# Patient Record
Sex: Female | Born: 1945
Health system: Southern US, Community
[De-identification: ages and names within clinical notes are randomized; demographics above are authoritative.]

## PROBLEM LIST (undated history)

## (undated) DIAGNOSIS — G4733 Obstructive sleep apnea (adult) (pediatric): Secondary | ICD-10-CM

## (undated) DIAGNOSIS — T7840XA Allergy, unspecified, initial encounter: Secondary | ICD-10-CM

## (undated) DIAGNOSIS — F410 Panic disorder [episodic paroxysmal anxiety] without agoraphobia: Secondary | ICD-10-CM

## (undated) DIAGNOSIS — E785 Hyperlipidemia, unspecified: Secondary | ICD-10-CM

## (undated) DIAGNOSIS — E213 Hyperparathyroidism, unspecified: Secondary | ICD-10-CM

## (undated) DIAGNOSIS — M25561 Pain in right knee: Secondary | ICD-10-CM

## (undated) DIAGNOSIS — I8390 Asymptomatic varicose veins of unspecified lower extremity: Secondary | ICD-10-CM

## (undated) DIAGNOSIS — M1990 Unspecified osteoarthritis, unspecified site: Secondary | ICD-10-CM

## (undated) DIAGNOSIS — K219 Gastro-esophageal reflux disease without esophagitis: Secondary | ICD-10-CM

## (undated) DIAGNOSIS — R112 Nausea with vomiting, unspecified: Secondary | ICD-10-CM

## (undated) DIAGNOSIS — M792 Neuralgia and neuritis, unspecified: Secondary | ICD-10-CM

## (undated) DIAGNOSIS — E119 Type 2 diabetes mellitus without complications: Secondary | ICD-10-CM

## (undated) DIAGNOSIS — R0600 Dyspnea, unspecified: Secondary | ICD-10-CM

## (undated) DIAGNOSIS — D649 Anemia, unspecified: Secondary | ICD-10-CM

## (undated) DIAGNOSIS — E669 Obesity, unspecified: Secondary | ICD-10-CM

## (undated) DIAGNOSIS — N393 Stress incontinence (female) (male): Secondary | ICD-10-CM

## (undated) DIAGNOSIS — M797 Fibromyalgia: Secondary | ICD-10-CM

## (undated) DIAGNOSIS — I10 Essential (primary) hypertension: Secondary | ICD-10-CM

## (undated) DIAGNOSIS — E559 Vitamin D deficiency, unspecified: Secondary | ICD-10-CM

## (undated) DIAGNOSIS — J45909 Unspecified asthma, uncomplicated: Secondary | ICD-10-CM

## (undated) DIAGNOSIS — R011 Cardiac murmur, unspecified: Secondary | ICD-10-CM

## (undated) DIAGNOSIS — R519 Headache, unspecified: Secondary | ICD-10-CM

## (undated) DIAGNOSIS — F418 Other specified anxiety disorders: Secondary | ICD-10-CM

## (undated) DIAGNOSIS — M6289 Other specified disorders of muscle: Secondary | ICD-10-CM

## (undated) DIAGNOSIS — C801 Malignant (primary) neoplasm, unspecified: Secondary | ICD-10-CM

## (undated) DIAGNOSIS — R55 Syncope and collapse: Secondary | ICD-10-CM

## (undated) DIAGNOSIS — R51 Headache: Secondary | ICD-10-CM

## (undated) DIAGNOSIS — M2550 Pain in unspecified joint: Secondary | ICD-10-CM

## (undated) DIAGNOSIS — M549 Dorsalgia, unspecified: Secondary | ICD-10-CM

## (undated) DIAGNOSIS — T753XXA Motion sickness, initial encounter: Secondary | ICD-10-CM

## (undated) DIAGNOSIS — M8580 Other specified disorders of bone density and structure, unspecified site: Secondary | ICD-10-CM

## (undated) DIAGNOSIS — K829 Disease of gallbladder, unspecified: Secondary | ICD-10-CM

## (undated) HISTORY — PX: LUMBAR DISC SURGERY: SHX700

## (undated) HISTORY — PX: BLEPHAROPLASTY: SUR158

## (undated) HISTORY — DX: Neuralgia and neuritis, unspecified: M79.2

## (undated) HISTORY — DX: Motion sickness, initial encounter: T75.3XXA

## (undated) HISTORY — PX: BARIATRIC SURGERY: SHX1103

## (undated) HISTORY — DX: Asymptomatic varicose veins of unspecified lower extremity: I83.90

## (undated) HISTORY — DX: Obstructive sleep apnea (adult) (pediatric): G47.33

## (undated) HISTORY — PX: EYE SURGERY: SHX253

## (undated) HISTORY — DX: Dorsalgia, unspecified: M54.9

## (undated) HISTORY — DX: Dyspnea, unspecified: R06.00

## (undated) HISTORY — DX: Hyperparathyroidism, unspecified: E21.3

## (undated) HISTORY — PX: OTHER SURGICAL HISTORY: SHX169

## (undated) HISTORY — DX: Disease of gallbladder, unspecified: K82.9

## (undated) HISTORY — DX: Hyperlipidemia, unspecified: E78.5

## (undated) HISTORY — PX: CERVICAL DISC SURGERY: SHX588

## (undated) HISTORY — PX: BLADDER SUSPENSION: SHX72

## (undated) HISTORY — DX: Pain in unspecified joint: M25.50

## (undated) HISTORY — DX: Pain in right knee: M25.561

## (undated) HISTORY — DX: Headache, unspecified: R51.9

## (undated) HISTORY — DX: Nausea with vomiting, unspecified: R11.2

## (undated) HISTORY — DX: Fibromyalgia: M79.7

## (undated) HISTORY — DX: Other specified disorders of muscle: M62.89

## (undated) HISTORY — DX: Other specified anxiety disorders: F41.8

## (undated) HISTORY — DX: Syncope and collapse: R55

## (undated) HISTORY — DX: Gastro-esophageal reflux disease without esophagitis: K21.9

## (undated) HISTORY — DX: Obesity, unspecified: E66.9

## (undated) HISTORY — DX: Unspecified osteoarthritis, unspecified site: M19.90

## (undated) HISTORY — DX: Other specified disorders of bone density and structure, unspecified site: M85.80

## (undated) HISTORY — DX: Stress incontinence (female) (male): N39.3

## (undated) HISTORY — DX: Allergy, unspecified, initial encounter: T78.40XA

## (undated) HISTORY — DX: Headache: R51

## (undated) HISTORY — DX: Unspecified asthma, uncomplicated: J45.909

## (undated) HISTORY — DX: Essential (primary) hypertension: I10

## (undated) HISTORY — PX: CATARACT EXTRACTION: SUR2

## (undated) HISTORY — DX: Vitamin D deficiency, unspecified: E55.9

## (undated) HISTORY — DX: Type 2 diabetes mellitus without complications: E11.9

---
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
## 1976-01-23 HISTORY — PX: GALLBLADDER SURGERY: SHX652

## 1998-03-04 ENCOUNTER — Other Ambulatory Visit: Admission: RE | Admit: 1998-03-04 | Discharge: 1998-03-04 | Payer: Self-pay | Admitting: *Deleted

## 1998-03-10 ENCOUNTER — Observation Stay (HOSPITAL_COMMUNITY): Admission: RE | Admit: 1998-03-10 | Discharge: 1998-03-11 | Payer: Self-pay | Admitting: Neurosurgery

## 1998-03-10 ENCOUNTER — Encounter: Payer: Self-pay | Admitting: Neurosurgery

## 1998-04-21 ENCOUNTER — Ambulatory Visit (HOSPITAL_COMMUNITY): Admission: RE | Admit: 1998-04-21 | Discharge: 1998-04-21 | Payer: Self-pay | Admitting: Neurosurgery

## 1998-04-21 ENCOUNTER — Encounter: Payer: Self-pay | Admitting: Neurosurgery

## 1999-03-26 ENCOUNTER — Ambulatory Visit: Admission: RE | Admit: 1999-03-26 | Discharge: 1999-03-26 | Payer: Self-pay | Admitting: Otolaryngology

## 1999-07-02 ENCOUNTER — Ambulatory Visit (HOSPITAL_BASED_OUTPATIENT_CLINIC_OR_DEPARTMENT_OTHER): Admission: RE | Admit: 1999-07-02 | Discharge: 1999-07-02 | Payer: Self-pay | Admitting: Otolaryngology

## 1999-09-15 ENCOUNTER — Encounter: Admission: RE | Admit: 1999-09-15 | Discharge: 1999-09-15 | Payer: Self-pay | Admitting: Gynecology

## 1999-09-15 ENCOUNTER — Encounter: Payer: Self-pay | Admitting: Gynecology

## 1999-10-02 ENCOUNTER — Other Ambulatory Visit: Admission: RE | Admit: 1999-10-02 | Discharge: 1999-10-02 | Payer: Self-pay | Admitting: Gynecology

## 2000-12-20 ENCOUNTER — Encounter: Payer: Self-pay | Admitting: Gynecology

## 2000-12-20 ENCOUNTER — Encounter: Admission: RE | Admit: 2000-12-20 | Discharge: 2000-12-20 | Payer: Self-pay | Admitting: Gynecology

## 2001-01-13 ENCOUNTER — Other Ambulatory Visit: Admission: RE | Admit: 2001-01-13 | Discharge: 2001-01-13 | Payer: Self-pay | Admitting: Gynecology

## 2001-01-22 HISTORY — PX: GASTRIC BYPASS: SHX52

## 2002-01-29 ENCOUNTER — Encounter: Admission: RE | Admit: 2002-01-29 | Discharge: 2002-01-29 | Payer: Self-pay | Admitting: Gynecology

## 2002-01-29 ENCOUNTER — Encounter: Payer: Self-pay | Admitting: Gynecology

## 2002-02-12 ENCOUNTER — Encounter: Admission: RE | Admit: 2002-02-12 | Discharge: 2002-02-12 | Payer: Self-pay | Admitting: Family Medicine

## 2002-02-12 ENCOUNTER — Encounter: Payer: Self-pay | Admitting: Family Medicine

## 2002-04-02 ENCOUNTER — Other Ambulatory Visit: Admission: RE | Admit: 2002-04-02 | Discharge: 2002-04-02 | Payer: Self-pay | Admitting: Gynecology

## 2002-12-07 ENCOUNTER — Encounter: Admission: RE | Admit: 2002-12-07 | Discharge: 2002-12-07 | Payer: Self-pay | Admitting: Family Medicine

## 2003-02-01 ENCOUNTER — Inpatient Hospital Stay (HOSPITAL_COMMUNITY): Admission: AD | Admit: 2003-02-01 | Discharge: 2003-02-03 | Payer: Self-pay | Admitting: Neurosurgery

## 2003-03-23 ENCOUNTER — Encounter: Admission: RE | Admit: 2003-03-23 | Discharge: 2003-03-23 | Payer: Self-pay | Admitting: Gynecology

## 2003-03-25 ENCOUNTER — Encounter: Admission: RE | Admit: 2003-03-25 | Discharge: 2003-03-25 | Payer: Self-pay | Admitting: Neurosurgery

## 2003-04-09 ENCOUNTER — Other Ambulatory Visit: Admission: RE | Admit: 2003-04-09 | Discharge: 2003-04-09 | Payer: Self-pay | Admitting: Gynecology

## 2003-04-21 ENCOUNTER — Ambulatory Visit: Admission: RE | Admit: 2003-04-21 | Discharge: 2003-04-21 | Payer: Self-pay | Admitting: Gynecology

## 2003-08-25 ENCOUNTER — Encounter: Admission: RE | Admit: 2003-08-25 | Discharge: 2003-08-25 | Payer: Self-pay | Admitting: Family Medicine

## 2003-09-10 ENCOUNTER — Other Ambulatory Visit: Admission: RE | Admit: 2003-09-10 | Discharge: 2003-09-10 | Payer: Self-pay | Admitting: Gynecology

## 2003-11-12 ENCOUNTER — Ambulatory Visit: Payer: Self-pay | Admitting: Family Medicine

## 2003-11-24 ENCOUNTER — Ambulatory Visit: Payer: Self-pay | Admitting: Family Medicine

## 2003-12-15 ENCOUNTER — Encounter: Admission: RE | Admit: 2003-12-15 | Discharge: 2003-12-15 | Payer: Self-pay | Admitting: Family Medicine

## 2004-04-10 ENCOUNTER — Ambulatory Visit: Payer: Self-pay | Admitting: Internal Medicine

## 2004-04-24 ENCOUNTER — Encounter: Admission: RE | Admit: 2004-04-24 | Discharge: 2004-04-24 | Payer: Self-pay | Admitting: Gynecology

## 2004-05-08 ENCOUNTER — Encounter: Admission: RE | Admit: 2004-05-08 | Discharge: 2004-05-08 | Payer: Self-pay | Admitting: Gynecology

## 2004-05-09 ENCOUNTER — Other Ambulatory Visit: Admission: RE | Admit: 2004-05-09 | Discharge: 2004-05-09 | Payer: Self-pay | Admitting: Gynecology

## 2004-07-14 ENCOUNTER — Ambulatory Visit (HOSPITAL_COMMUNITY): Admission: RE | Admit: 2004-07-14 | Discharge: 2004-07-14 | Payer: Self-pay | Admitting: Specialist

## 2004-07-14 ENCOUNTER — Encounter (INDEPENDENT_AMBULATORY_CARE_PROVIDER_SITE_OTHER): Payer: Self-pay | Admitting: *Deleted

## 2004-07-14 ENCOUNTER — Ambulatory Visit (HOSPITAL_BASED_OUTPATIENT_CLINIC_OR_DEPARTMENT_OTHER): Admission: RE | Admit: 2004-07-14 | Discharge: 2004-07-15 | Payer: Self-pay | Admitting: Specialist

## 2004-09-15 ENCOUNTER — Ambulatory Visit: Payer: Self-pay | Admitting: Internal Medicine

## 2005-02-12 ENCOUNTER — Ambulatory Visit: Payer: Self-pay | Admitting: Hematology and Oncology

## 2005-03-12 ENCOUNTER — Ambulatory Visit (HOSPITAL_COMMUNITY): Admission: RE | Admit: 2005-03-12 | Discharge: 2005-03-12 | Payer: Self-pay | Admitting: Gastroenterology

## 2005-03-14 ENCOUNTER — Ambulatory Visit: Payer: Self-pay | Admitting: Internal Medicine

## 2005-03-16 ENCOUNTER — Ambulatory Visit (HOSPITAL_COMMUNITY): Admission: RE | Admit: 2005-03-16 | Discharge: 2005-03-16 | Payer: Self-pay | Admitting: Gastroenterology

## 2005-06-26 ENCOUNTER — Ambulatory Visit: Payer: Self-pay | Admitting: Internal Medicine

## 2005-06-29 ENCOUNTER — Ambulatory Visit: Payer: Self-pay | Admitting: Cardiology

## 2005-07-17 ENCOUNTER — Encounter: Admission: RE | Admit: 2005-07-17 | Discharge: 2005-07-17 | Payer: Self-pay | Admitting: Gynecology

## 2005-08-07 ENCOUNTER — Ambulatory Visit: Payer: Self-pay | Admitting: Internal Medicine

## 2005-08-24 ENCOUNTER — Ambulatory Visit: Payer: Self-pay | Admitting: Internal Medicine

## 2005-09-25 ENCOUNTER — Other Ambulatory Visit: Admission: RE | Admit: 2005-09-25 | Discharge: 2005-09-25 | Payer: Self-pay | Admitting: Gynecology

## 2005-11-30 ENCOUNTER — Encounter: Admission: RE | Admit: 2005-11-30 | Discharge: 2005-11-30 | Payer: Self-pay | Admitting: Family Medicine

## 2006-01-23 ENCOUNTER — Ambulatory Visit: Payer: Self-pay | Admitting: Internal Medicine

## 2006-02-22 ENCOUNTER — Ambulatory Visit: Payer: Self-pay | Admitting: Internal Medicine

## 2006-03-05 ENCOUNTER — Encounter: Payer: Self-pay | Admitting: Family Medicine

## 2006-03-05 ENCOUNTER — Encounter: Admission: RE | Admit: 2006-03-05 | Discharge: 2006-03-05 | Payer: Self-pay | Admitting: Family Medicine

## 2006-03-21 ENCOUNTER — Ambulatory Visit (HOSPITAL_COMMUNITY): Admission: RE | Admit: 2006-03-21 | Discharge: 2006-03-21 | Payer: Self-pay | Admitting: Surgery

## 2006-05-23 ENCOUNTER — Ambulatory Visit: Payer: Self-pay | Admitting: Internal Medicine

## 2006-07-10 ENCOUNTER — Encounter: Admission: RE | Admit: 2006-07-10 | Discharge: 2006-07-10 | Payer: Self-pay | Admitting: Specialist

## 2006-07-17 ENCOUNTER — Inpatient Hospital Stay (HOSPITAL_COMMUNITY): Admission: EM | Admit: 2006-07-17 | Discharge: 2006-07-20 | Payer: Self-pay | Admitting: Emergency Medicine

## 2006-07-23 ENCOUNTER — Encounter: Admission: RE | Admit: 2006-07-23 | Discharge: 2006-07-23 | Payer: Self-pay | Admitting: Obstetrics and Gynecology

## 2006-07-29 ENCOUNTER — Encounter: Admission: RE | Admit: 2006-07-29 | Discharge: 2006-07-29 | Payer: Self-pay | Admitting: Gastroenterology

## 2006-07-31 ENCOUNTER — Ambulatory Visit: Payer: Self-pay | Admitting: Internal Medicine

## 2006-08-01 ENCOUNTER — Ambulatory Visit: Payer: Self-pay | Admitting: Internal Medicine

## 2006-10-31 ENCOUNTER — Ambulatory Visit (HOSPITAL_COMMUNITY): Admission: RE | Admit: 2006-10-31 | Discharge: 2006-11-01 | Payer: Self-pay | Admitting: Specialist

## 2006-11-20 ENCOUNTER — Ambulatory Visit: Payer: Self-pay | Admitting: Physical Medicine & Rehabilitation

## 2006-11-20 ENCOUNTER — Encounter
Admission: RE | Admit: 2006-11-20 | Discharge: 2006-11-21 | Payer: Self-pay | Admitting: Physical Medicine & Rehabilitation

## 2006-12-09 ENCOUNTER — Encounter: Admission: RE | Admit: 2006-12-09 | Discharge: 2007-01-08 | Payer: Self-pay | Admitting: Anesthesiology

## 2006-12-10 ENCOUNTER — Ambulatory Visit: Payer: Self-pay | Admitting: Anesthesiology

## 2006-12-17 ENCOUNTER — Encounter: Admission: RE | Admit: 2006-12-17 | Discharge: 2006-12-17 | Payer: Self-pay | Admitting: Family

## 2007-01-23 HISTORY — PX: HERNIA REPAIR: SHX51

## 2007-02-12 ENCOUNTER — Encounter
Admission: RE | Admit: 2007-02-12 | Discharge: 2007-05-13 | Payer: Self-pay | Admitting: Physical Medicine & Rehabilitation

## 2007-02-12 ENCOUNTER — Ambulatory Visit: Payer: Self-pay | Admitting: Physical Medicine & Rehabilitation

## 2007-02-25 ENCOUNTER — Telehealth (INDEPENDENT_AMBULATORY_CARE_PROVIDER_SITE_OTHER): Payer: Self-pay | Admitting: *Deleted

## 2007-02-27 ENCOUNTER — Encounter: Payer: Self-pay | Admitting: Internal Medicine

## 2007-02-27 DIAGNOSIS — K219 Gastro-esophageal reflux disease without esophagitis: Secondary | ICD-10-CM | POA: Insufficient documentation

## 2007-02-27 DIAGNOSIS — J3089 Other allergic rhinitis: Secondary | ICD-10-CM

## 2007-02-27 DIAGNOSIS — E669 Obesity, unspecified: Secondary | ICD-10-CM

## 2007-02-27 DIAGNOSIS — IMO0001 Reserved for inherently not codable concepts without codable children: Secondary | ICD-10-CM | POA: Insufficient documentation

## 2007-02-27 DIAGNOSIS — J302 Other seasonal allergic rhinitis: Secondary | ICD-10-CM | POA: Insufficient documentation

## 2007-02-27 DIAGNOSIS — J45909 Unspecified asthma, uncomplicated: Secondary | ICD-10-CM | POA: Insufficient documentation

## 2007-02-27 DIAGNOSIS — G4733 Obstructive sleep apnea (adult) (pediatric): Secondary | ICD-10-CM | POA: Insufficient documentation

## 2007-03-11 ENCOUNTER — Ambulatory Visit: Payer: Self-pay | Admitting: Internal Medicine

## 2007-04-09 ENCOUNTER — Ambulatory Visit: Payer: Self-pay | Admitting: Physical Medicine & Rehabilitation

## 2007-04-15 ENCOUNTER — Encounter
Admission: RE | Admit: 2007-04-15 | Discharge: 2007-07-14 | Payer: Self-pay | Admitting: Physical Medicine & Rehabilitation

## 2007-05-12 ENCOUNTER — Encounter
Admission: RE | Admit: 2007-05-12 | Discharge: 2007-08-10 | Payer: Self-pay | Admitting: Physical Medicine & Rehabilitation

## 2007-05-15 ENCOUNTER — Ambulatory Visit: Payer: Self-pay | Admitting: Physical Medicine & Rehabilitation

## 2007-05-22 ENCOUNTER — Ambulatory Visit: Payer: Self-pay | Admitting: Internal Medicine

## 2007-05-28 ENCOUNTER — Encounter: Payer: Self-pay | Admitting: Internal Medicine

## 2007-06-10 ENCOUNTER — Ambulatory Visit: Payer: Self-pay | Admitting: Physical Medicine & Rehabilitation

## 2007-07-07 ENCOUNTER — Telehealth (INDEPENDENT_AMBULATORY_CARE_PROVIDER_SITE_OTHER): Payer: Self-pay | Admitting: *Deleted

## 2007-07-28 ENCOUNTER — Encounter
Admission: RE | Admit: 2007-07-28 | Discharge: 2007-08-21 | Payer: Self-pay | Admitting: Physical Medicine & Rehabilitation

## 2007-07-29 ENCOUNTER — Ambulatory Visit: Payer: Self-pay | Admitting: Physical Medicine & Rehabilitation

## 2007-08-08 ENCOUNTER — Encounter: Admission: RE | Admit: 2007-08-08 | Discharge: 2007-08-08 | Payer: Self-pay | Admitting: Obstetrics and Gynecology

## 2007-08-21 ENCOUNTER — Ambulatory Visit: Payer: Self-pay | Admitting: Physical Medicine & Rehabilitation

## 2007-10-15 ENCOUNTER — Encounter
Admission: RE | Admit: 2007-10-15 | Discharge: 2007-10-17 | Payer: Self-pay | Admitting: Physical Medicine & Rehabilitation

## 2007-10-17 ENCOUNTER — Ambulatory Visit: Payer: Self-pay | Admitting: Physical Medicine & Rehabilitation

## 2007-11-19 ENCOUNTER — Ambulatory Visit: Payer: Self-pay | Admitting: Internal Medicine

## 2007-11-20 ENCOUNTER — Ambulatory Visit: Payer: Self-pay | Admitting: Internal Medicine

## 2007-11-24 ENCOUNTER — Ambulatory Visit: Payer: Self-pay | Admitting: Internal Medicine

## 2007-11-24 DIAGNOSIS — R0602 Shortness of breath: Secondary | ICD-10-CM | POA: Insufficient documentation

## 2007-12-05 ENCOUNTER — Encounter: Admission: RE | Admit: 2007-12-05 | Discharge: 2007-12-05 | Payer: Self-pay | Admitting: Neurosurgery

## 2007-12-22 ENCOUNTER — Inpatient Hospital Stay (HOSPITAL_COMMUNITY): Admission: RE | Admit: 2007-12-22 | Discharge: 2007-12-24 | Payer: Self-pay | Admitting: Neurosurgery

## 2008-01-06 ENCOUNTER — Ambulatory Visit: Payer: Self-pay | Admitting: Internal Medicine

## 2008-01-07 ENCOUNTER — Encounter: Payer: Self-pay | Admitting: Internal Medicine

## 2008-01-27 ENCOUNTER — Encounter: Admission: RE | Admit: 2008-01-27 | Discharge: 2008-01-27 | Payer: Self-pay | Admitting: Neurosurgery

## 2008-02-16 ENCOUNTER — Encounter
Admission: RE | Admit: 2008-02-16 | Discharge: 2008-02-20 | Payer: Self-pay | Admitting: Physical Medicine & Rehabilitation

## 2008-02-17 ENCOUNTER — Ambulatory Visit: Payer: Self-pay | Admitting: Physical Medicine & Rehabilitation

## 2008-04-05 ENCOUNTER — Ambulatory Visit: Payer: Self-pay | Admitting: Internal Medicine

## 2008-04-06 ENCOUNTER — Encounter: Admission: RE | Admit: 2008-04-06 | Discharge: 2008-04-06 | Payer: Self-pay | Admitting: Neurosurgery

## 2008-04-12 ENCOUNTER — Encounter: Admission: RE | Admit: 2008-04-12 | Discharge: 2008-04-12 | Payer: Self-pay | Admitting: Neurosurgery

## 2008-05-07 ENCOUNTER — Ambulatory Visit: Payer: Self-pay | Admitting: Internal Medicine

## 2008-08-18 ENCOUNTER — Ambulatory Visit: Payer: Self-pay | Admitting: Internal Medicine

## 2008-09-03 ENCOUNTER — Encounter: Admission: RE | Admit: 2008-09-03 | Discharge: 2008-09-03 | Payer: Self-pay | Admitting: Neurosurgery

## 2008-11-03 ENCOUNTER — Encounter: Admission: RE | Admit: 2008-11-03 | Discharge: 2008-11-03 | Payer: Self-pay | Admitting: Obstetrics and Gynecology

## 2008-11-08 ENCOUNTER — Ambulatory Visit: Payer: Self-pay | Admitting: Internal Medicine

## 2008-11-29 ENCOUNTER — Encounter: Admission: RE | Admit: 2008-11-29 | Discharge: 2008-11-29 | Payer: Self-pay | Admitting: Family Medicine

## 2009-05-02 ENCOUNTER — Telehealth (INDEPENDENT_AMBULATORY_CARE_PROVIDER_SITE_OTHER): Payer: Self-pay | Admitting: *Deleted

## 2009-05-06 ENCOUNTER — Ambulatory Visit: Payer: Self-pay | Admitting: Internal Medicine

## 2009-05-09 ENCOUNTER — Ambulatory Visit: Payer: Self-pay | Admitting: Internal Medicine

## 2009-05-10 ENCOUNTER — Ambulatory Visit: Payer: Self-pay | Admitting: Internal Medicine

## 2009-05-12 ENCOUNTER — Telehealth (INDEPENDENT_AMBULATORY_CARE_PROVIDER_SITE_OTHER): Payer: Self-pay | Admitting: *Deleted

## 2009-05-18 ENCOUNTER — Telehealth: Payer: Self-pay | Admitting: Internal Medicine

## 2009-07-01 ENCOUNTER — Ambulatory Visit (HOSPITAL_BASED_OUTPATIENT_CLINIC_OR_DEPARTMENT_OTHER): Admission: RE | Admit: 2009-07-01 | Discharge: 2009-07-01 | Payer: Self-pay | Admitting: Urology

## 2009-07-03 ENCOUNTER — Emergency Department (HOSPITAL_COMMUNITY): Admission: EM | Admit: 2009-07-03 | Discharge: 2009-07-03 | Payer: Self-pay | Admitting: Emergency Medicine

## 2009-07-04 ENCOUNTER — Inpatient Hospital Stay (HOSPITAL_COMMUNITY): Admission: AD | Admit: 2009-07-04 | Discharge: 2009-07-12 | Payer: Self-pay | Admitting: Urology

## 2009-07-04 ENCOUNTER — Ambulatory Visit: Payer: Self-pay | Admitting: Internal Medicine

## 2009-07-05 ENCOUNTER — Ambulatory Visit: Payer: Self-pay | Admitting: Infectious Disease

## 2009-07-05 ENCOUNTER — Encounter (INDEPENDENT_AMBULATORY_CARE_PROVIDER_SITE_OTHER): Payer: Self-pay | Admitting: Urology

## 2009-07-15 ENCOUNTER — Encounter: Payer: Self-pay | Admitting: Infectious Disease

## 2009-08-08 ENCOUNTER — Ambulatory Visit: Payer: Self-pay | Admitting: Internal Medicine

## 2009-11-07 ENCOUNTER — Inpatient Hospital Stay (HOSPITAL_COMMUNITY): Admission: RE | Admit: 2009-11-07 | Discharge: 2009-11-13 | Payer: Self-pay | Admitting: Surgery

## 2009-11-07 ENCOUNTER — Encounter (INDEPENDENT_AMBULATORY_CARE_PROVIDER_SITE_OTHER): Payer: Self-pay | Admitting: Surgery

## 2009-11-07 ENCOUNTER — Ambulatory Visit: Payer: Self-pay | Admitting: Internal Medicine

## 2009-11-08 ENCOUNTER — Encounter (INDEPENDENT_AMBULATORY_CARE_PROVIDER_SITE_OTHER): Payer: Self-pay | Admitting: Ophthalmology

## 2009-11-22 LAB — HM MAMMOGRAPHY

## 2009-12-09 ENCOUNTER — Encounter: Admission: RE | Admit: 2009-12-09 | Discharge: 2009-12-09 | Payer: Self-pay | Admitting: Obstetrics and Gynecology

## 2009-12-30 ENCOUNTER — Encounter: Payer: Self-pay | Admitting: Family Medicine

## 2010-02-15 ENCOUNTER — Ambulatory Visit: Payer: Self-pay | Admitting: Internal Medicine

## 2010-02-21 NOTE — Assessment & Plan Note (Signed)
Summary: ROV 3 MONTHS///KP   Primary Provider/Referring Provider:  [facility]  CC:  3 month follow up visit-allergies and sleep; no complaints..  History of Present Illness:  05/07/08 OSA, Allergic rhinitis, asthma Much rhinorhea, sneeze and drip. Incidental headaches without much blockage. Ears stuffy. Chest not bad, not wheezing. Claritin not enough help- uses benadryl despite sedation.On prednisone now. Cpap continues at 13. Not sleeping well restless, estimating 4 hours. Clonazepam helps her leg jerks. We discussed taking extra.  November 08, 2008- Allergic rhinitis, asthma Says she has had a cough for 4 weeks. Toodk a Zpak starting 10/8, with tessalon. Yellow changed to white mucus. Cough is milder. Denies chest tightness, wheeze, dyspnea. Nose runs, with a lttle headache but no earache. Did have fever at onset. Had cortisone injection in her  back last week. Continues allergy vaccine.  May 09, 2009- Allergic rhinitis, asthma Had flu syndrome during the winter despite flu shot. Since then her right ear pops. CPAP mask fit has not been good and she thinks she snores through it. Stays tired, but she is taking benadryl daily. Xyzal and allergy vaccine aren't controlling her rhinitis. Having increased watery eyes.Occasional cough is worse outside. Getting large local reactions to her allergy vaccine since the pollens went up.  August 08, 2009- Allergic rhinitis, ashtma Has temporary colostomy after colon nicked in during a bladder sling procedure. No respiratory complications.  Patanase resolved her ear problem. Sudafed resolves watery nose if needed. She continues Singulair, but not needing Advair now.  She is back to using her CPAP every night. The sleep center fixed her with a new mask that fits much better- nasal mask. Clonazepam every night suppresses limb jerks.     Asthma History    Initial Asthma Severity Rating:    Age range: 12+ years    Symptoms: 0-2 days/week    Nighttime  Awakenings: 0-2/month    Interferes w/ normal activity: no limitations    SABA use (not for EIB): 0-2 days/week    Asthma Severity Assessment: Intermittent   Preventive Screening-Counseling & Management  Alcohol-Tobacco     Smoking Status: never  Current Medications (verified): 1)  Bd Tb Syringe 27g X 1/2" 1 Ml  Misc (Tuberculin-Allergy Syringes) .... Use As Directed 2)  Clonazepam 1 Mg Tabs (Clonazepam) .[name].. 1 At Bedtime 3)  Cymbalta 60 Mg Cpep (Duloxetine Hcl) .... Take 1 Tablet By Mouth Once A Day 4)  Advair Diskus 250-50 Mcg/dose  Misc (Fluticasone-Salmeterol) .... Inhale 1 Puff Two Times A Day 5)  Singulair 10 Mg  Tabs (Montelukast Sodium) .... Take 1 Tablet By Mouth Once A Day 6)  Atrovent 0.06 %  Soln (Ipratropium Bromide) .... Nasal Spray As Needed 7)  Allergy Vaccine 1:10 G.o. (W-E) .... Once Weekly 8)  Protonix 40 Mg Tbec (Pantoprazole Sodium) .... Take 1 By Mouth Once Daily 9)  Trazodone Hcl 50 Mg Tabs (Trazodone Hcl) .... Take 1/2 By Mouth At Bedtime 10)  Calcitriol 0.5 Mcg Caps (Calcitriol) .... Take 1 By Mouth Once Daily 11)  Alprazolam 0.25 Mg Tabs (Alprazolam) .... Take 1 By Mouth Once Daily 12)  Cpap .... Set On 12 [facility] 13)  Patanase 0.6 % Soln (Olopatadine Hcl) .[name].. 1 To 2 Sprays Each Nostril Once Daily  Allergies (verified): 1)  ! Pcn 2)  ! Codeine 3)  ! * Erthromyci N 4)  ! Demerol 5)  ! Keflex 6)  ! Levaquin 7)  ! Sulfa 8)  ! Percocet  Past History:  Past Medical History:  Last updated: 11/19/2007 OBESITY (ICD-278.00) ESOPHAGEAL REFLUX (ICD-530.81) FIBROMYALGIA (ICD-729.1) ASTHMA (ICD-493.90) ALLERGIC RHINITIS (ICD-477.9) SLEEP APNEA, OBSTRUCTIVE (ICD-327.23)  Family History: Last updated: 05/07/2008 Mother- accidental med overdose, ETOH Father - died CHF  Social History: Last updated: 05/07/2008 Married Patient never smoked. Parents smoked.  Risk Factors: Smoking Status: never (08/08/2009)  Past Surgical History: Bariatric  Surgery Lumbar disk  cervical disk Bladder sling- complicated by bowel nick/ clolostomy  Review of Systems      See HPI  The patient denies shortness of breath with activity, shortness of breath at rest, productive cough, non-productive cough, coughing up blood, chest pain, irregular heartbeats, acid heartburn, indigestion, loss of appetite, weight change, abdominal pain, difficulty swallowing, sore throat, tooth/dental problems, headaches, nasal congestion/difficulty breathing through nose, and sneezing.    Vital Signs:  Patient profile:   65 year old female Height:      59 inches Weight:      181.38 pounds BMI:     36.77 O2 Sat:      98 % on Room air Pulse rate:   79 / minute BP sitting:   118 / 70  (left arm) Cuff size:   regular  Vitals Entered By: Reynaldo Minium CMA (August 08, 2009 4:19 PM)  O2 Flow:  Room air CC: 3 month follow up visit-allergies and sleep; no complaints.   Physical Exam  Additional Exam:  General: A/Ox3; pleasant and cooperative, NAD, obese,  SKIN: no rash, lesions NODES: no lymphadenopathy HEENT: [place]/AT, EOM- WNL, Conjuctivae- clear, PERRLA, TM-WNL, Nose- sniffing, Throat- clear, Mallampati III, no visible irritation or drainage, voice NL. NECK: Supple w/ fair ROM, JVD- none, normal carotid impulses w/o bruits Thyroid- normal to palpation CHEST: Clear and unlabored HEART: RRR, occ skip beat, no m/g/r heard ABDOMEN:  WNU:UVOZ, nl pulses, no edema  NEURO: Grossly intact to observation      Impression & Recommendations:  Problem # 1:  ASTHMA (ICD-493.90) Good control now without need currently for an active bronchodilator  Problem # 2:  SLEEP APNEA, OBSTRUCTIVE (ICD-327.23)  Good compliance and control now with CPAP at 12.  Her Limb jerks are well controlled with clonazepam, which was discussed and refilled today.  Problem # 3:  ALLERGIC RHINITIS (ICD-477.9)  She is doing well with allergy vaccine and patanase with Xyzal. The following  medications were removed from the medication list:    Nasonex 50 Mcg/act Susp (Mometasone furoate) .[name]... 1 spray each nostril two times a day Her updated medication list for this problem includes:    Atrovent 0.06 % Soln (Ipratropium bromide) ..... Nasal spray as needed    Patanase 0.6 % Soln (Olopatadine hcl) .[name]... 1 to 2 sprays each nostril once daily    Xyzal 5 Mg Tabs (Levocetirizine dihydrochloride) .[name]... 1 daily antinhistamine, as needed  Medications Added to Medication List This Visit: 1)  Xyzal 5 Mg Tabs (Levocetirizine dihydrochloride) .[name].. 1 daily antinhistamine, as needed  Other Orders: Est. Patient Level IV (36644)  Patient Instructions: 1)  Please schedule a follow-up appointment in 1 year. 2)  Scripts for Xyzal and clonazepam. 3)  continue CPAP at 12. Prescriptions: CLONAZEPAM 1 MG TABS (CLONAZEPAM) 1 at bedtime  #90 x 3   Entered and Authorized by:   Waymon Budge MD   Signed by:   Waymon Budge MD on 08/08/2009   Method used:   Print then Give to Patient   RxID:   [id number] XYZAL 5 MG TABS (LEVOCETIRIZINE DIHYDROCHLORIDE) 1 daily antinhistamine, as needed  #30  x prn   Entered and Authorized by:   Waymon Budge MD   Signed by:   Waymon Budge MD on 08/08/2009   Method used:   Print then Give to Patient   RxID:   [id number]

## 2010-02-21 NOTE — Progress Notes (Signed)
Summary: allergy serum  Phone Note Call from Patient Call back at [phone]   Caller: Patient Call For: young Summary of Call: Pt wants to prepare and send allergy serum to her home. Pt sent message via fax. Initial call taken by: Darletta Moll,  May 02, 2009 3:50 PM

## 2010-02-21 NOTE — Assessment & Plan Note (Signed)
Summary: rov 6 months///kp   Primary Provider/Referring Provider:  [facility]  CC:  Follow up visit-trouble with allergies and CPAP machine.  History of Present Illness: 04/05/08- OSA, Allergic rhinitis, asthma Blames allergy for 1 month of increasing head congestion, eyes and nose watering, Began with fever and has been taking antibiotics- doxy. Denies chest tight or wheeze, purulent or bloody discharge, sore throat, earache or headache.. Currently trying Nasonex,. Singulair,  Says previously claritin, Zyrtec and allegra didn't help a lot.  05/07/08 OSA, Allergic rhinitis, asthma Much rhinorhea, sneeze and drip. Incidental headaches without much blockage. Ears stuffy. Chest not bad, not wheezing. Claritin not enough help- uses benadryl despite sedation.On prednisone now. Cpap continues at 13. Not sleeping well restless, estimating 4 hours. Clonazepam helps her leg jerks. We discussed taking extra.  November 08, 2008- Allergic rhinitis, asthma Says she has had a cough for 4 weeks. Toodk a Zpak starting 10/8, with tessalon. Yellow changed to white mucus. Cough is milder. Denies chest tightness, wheeze, dyspnea. Nose runs, with a lttle headache but no earache. Did have fever at onset. Had cortisone injection in her  back last week. Continues allergy vaccine.  May 09, 2009- Allergic rhinitis, asthma Had flu syndrome during the winter despite flu shot. Since then her right ear pops. CPAP mask fit has not been good and she thinks she snores through it. Stays tired, but she is taking benadryl daily. Xyzal and allergy vaccine aren't controlling her rhinitis. Having increased watery eyes.Occasional cough is worse outside. Getting large local reactions to her allergy vaccine since the pollens went up.   Current Medications (verified): 1)  Bd Tb Syringe 27g X 1/2" 1 Ml  Misc (Tuberculin-Allergy Syringes) .... Use As Directed 2)  Clonazepam 1 Mg Tabs (Clonazepam) .[name].. 1 At Bedtime 3)  Cymbalta 60 Mg Cpep  (Duloxetine Hcl) .... Take 1 Tablet By Mouth Once A Day 4)  Enablex 15 Mg  Tb24 (Darifenacin Hydrobromide) .... Take 1 Tablet By Mouth Once A Day 5)  Advair Diskus 250-50 Mcg/dose  Misc (Fluticasone-Salmeterol) .... Inhale 1 Puff Two Times A Day 6)  Singulair 10 Mg  Tabs (Montelukast Sodium) .... Take 1 Tablet By Mouth Once A Day 7)  Atrovent 0.06 %  Soln (Ipratropium Bromide) .... Nasal Spray As Needed 8)  Allergy Vaccine 1:10 G.o. (W-E) .... Once Weekly 9)  Nasonex 50 Mcg/act Susp (Mometasone Furoate) .[name].. 1 Spray Each Nostril Two Times A Day 10)  Protonix 40 Mg Tbec (Pantoprazole Sodium) .... Take 1 By Mouth Once Daily 11)  Trazodone Hcl 50 Mg Tabs (Trazodone Hcl) .... Take 1/2 By Mouth At Bedtime 12)  Calcitriol 0.5 Mcg Caps (Calcitriol) .... Take 1 By Mouth Once Daily 13)  Alprazolam 0.25 Mg Tabs (Alprazolam) .... Take 1 By Mouth Once Daily 14)  Cpap .... Set On 12 [facility]  Allergies (verified): 1)  ! Pcn 2)  ! Codeine 3)  ! * Erthromyci N 4)  ! Demerol 5)  ! Keflex 6)  ! Levaquin 7)  ! Sulfa 8)  ! Percocet  Past History:  Past Medical History: Last updated: 11/19/2007 OBESITY (ICD-278.00) ESOPHAGEAL REFLUX (ICD-530.81) FIBROMYALGIA (ICD-729.1) ASTHMA (ICD-493.90) ALLERGIC RHINITIS (ICD-477.9) SLEEP APNEA, OBSTRUCTIVE (ICD-327.23)  Past Surgical History: Last updated: 01/06/2008 Bariatric Surgery Lumbar disk  cervical disk  Family History: Last updated: 05/07/2008 Mother- accidental med overdose, ETOH Father - died CHF  Social History: Last updated: 05/07/2008 Married Patient never smoked. Parents smoked.  Risk Factors: Smoking Status: never (05/07/2008)  Review of Systems  See HPI       The patient complains of prolonged cough.  The patient denies anorexia, fever, weight loss, weight gain, vision loss, decreased hearing, hoarseness, chest pain, syncope, dyspnea on exertion, peripheral edema, headaches, hemoptysis, abdominal pain, and severe  indigestion/heartburn.    Vital Signs:  Patient profile:   65 year old female Height:      59 inches Weight:      193 pounds BMI:     39.12 O2 Sat:      99 % on Room air Pulse rate:   59 / minute BP sitting:   120 / 76  (left arm) Cuff size:   regular  Vitals Entered By: Reynaldo Minium CMA (May 09, 2009 4:23 PM)  O2 Flow:  Room air  Physical Exam  Additional Exam:  General: A/Ox3; pleasant and cooperative, NAD, obese, wiping at eyes SKIN: no rash, lesions NODES: no lymphadenopathy HEENT: [place]/AT, EOM- WNL, Conjuctivae- clear, PERRLA, TM-WNL, Nose- sniffing,  question some mucosal atrophy on right, Throat- clear, Mallampati III, no visible irritation or drainage, voice NL. NECK: Supple w/ fair ROM, JVD- none, normal carotid impulses w/o bruits Thyroid- normal to palpation CHEST: crackles with light cough HEART: RRR, occ skip beat, no m/g/r heard ABDOMEN:  UJW:JXBJ, nl pulses, no edema  NEURO: Grossly intact to observation      Impression & Recommendations:  Problem # 1:  ALLERGIC RHINITIS (ICD-477.9)  We will have the weekly dose dropped back to 0.1 ml/ vial/ week and rebuild as tolerated. Her updated medication list for this problem includes:    Atrovent 0.06 % Soln (Ipratropium bromide) ..... Nasal spray as needed    Nasonex 50 Mcg/act Susp (Mometasone furoate) .[name]... 1 spray each nostril two times a day  Problem # 2:  ASTHMA (ICD-493.90) There is more rattle in her chest than I expected. i will get CXR. She has taken antibiotics twice - Z pak in January, Doxycycline in March.  Problem # 3:  SLEEP APNEA, OBSTRUCTIVE (ICD-327.23)  She is not tolerating CPAP as well as I would like, though she tries to be compliant. We will offer daytime sleep center CPAP eval.  Orders: Est. Patient Level III (47829) Sleep Disorder Referral (Sleep Disorder)  Medications Added to Medication List This Visit: 1)  Trazodone Hcl 50 Mg Tabs (Trazodone hcl) .... Take 1/2 by mouth at  bedtime 2)  Calcitriol 0.5 Mcg Caps (Calcitriol) .... Take 1 by mouth once daily 3)  Alprazolam 0.25 Mg Tabs (Alprazolam) .... Take 1 by mouth once daily 4)  Cpap  .... Set on [facility]  Other Orders: T-2 View CXR ([id number])  Patient Instructions: 1)  Please schedule a follow-up appointment in 3 months. 2)  See [facility] to set up evaluation for CPAP fit at the sleep center 3)  A chest x-ray has been recommended.  Your imaging study may require preauthorization.  4)  Sample Patanase nasal spray, 1-2 sprays each nostril, up to twice daily as needed. 5)  Allergy vaccine: 1:10 6)  Drop back to 0.1 ml/ vial / week 7)  Each week, advance by 0.1 ml / vial as tolerated until you either get to 0.5 ml (Hold there)  or hold at the highest dose that doesn't cause significant local reaction.

## 2010-02-21 NOTE — Progress Notes (Signed)
Summary: rx request  Phone Note Call from Patient   Caller: Patient Call For: young Summary of Call: pt wants rx for patanase nasal spray. call in to [facility] [address]. (pt was given a sample of this. says this works well and she still has some left- no urgency). pt # [id number] Initial call taken by: Tivis Ringer, CNA,  May 12, 2009 12:17 PM  Follow-up for Phone Call        pt states patanase works well. She was given samples at last ov. Please advise if ok to send rx. Thanks. Carron Curie CMA  May 12, 2009 12:32 PM   Additional Follow-up for Phone Call Additional follow up Details #1::        OK to script and add to med list Patanase nasal spray, # 1,  1-2 puffs each nostril, once daily, ref prn Additional Follow-up by: Waymon Budge MD,  May 12, 2009 1:35 PM    Additional Follow-up for Phone Call Additional follow up Details #2::    Rx for patanase was sent to pharm electronically.  Spoke with pt and made aware this has been done. Follow-up by: Vernie Murders,  May 12, 2009 1:47 PM  New/Updated Medications: PATANASE 0.6 % SOLN (OLOPATADINE HCL) 1 to 2 sprays each nostril once daily Prescriptions: PATANASE 0.6 % SOLN (OLOPATADINE HCL) 1 to 2 sprays each nostril once daily  #1 x 11   Entered by:   Vernie Murders   Authorized by:   Waymon Budge MD   Signed by:   Vernie Murders on 05/12/2009   Method used:   Electronically to        [facility]. [phone]* (retail)       [id number]       [place], [place]  [address]       Ph: [phone]       Fax: [phone]   RxID:   [id number]

## 2010-02-21 NOTE — Progress Notes (Signed)
Summary: Sleep Ctr mask refit:  ResMed small Swift FX  Phone Note Other Incoming   Summary of Call: Sleep Center staff worked with patient on CPAP mask fit, settling on a Resmed small Swift FX Initial call taken by: Waymon Budge MD,  May 18, 2009 1:32 PM

## 2010-02-21 NOTE — Medication Information (Signed)
Summary: [facility]  [facility]   Imported By: Florinda Marker 07/26/2009 09:40:47  _____________________________________________________________________  External Attachment:    Type:   Image     Comment:   External Document

## 2010-02-27 ENCOUNTER — Encounter: Payer: Self-pay | Admitting: Family Medicine

## 2010-03-01 ENCOUNTER — Encounter: Payer: Self-pay | Admitting: Family Medicine

## 2010-03-07 ENCOUNTER — Encounter: Payer: Self-pay | Admitting: Family Medicine

## 2010-03-07 ENCOUNTER — Ambulatory Visit (INDEPENDENT_AMBULATORY_CARE_PROVIDER_SITE_OTHER): Payer: BC Managed Care – PPO | Admitting: Family Medicine

## 2010-03-07 DIAGNOSIS — I080 Rheumatic disorders of both mitral and aortic valves: Secondary | ICD-10-CM | POA: Insufficient documentation

## 2010-03-07 DIAGNOSIS — I10 Essential (primary) hypertension: Secondary | ICD-10-CM

## 2010-03-07 DIAGNOSIS — M1610 Unilateral primary osteoarthritis, unspecified hip: Secondary | ICD-10-CM | POA: Insufficient documentation

## 2010-03-07 DIAGNOSIS — Z8614 Personal history of Methicillin resistant Staphylococcus aureus infection: Secondary | ICD-10-CM | POA: Insufficient documentation

## 2010-03-07 DIAGNOSIS — I079 Rheumatic tricuspid valve disease, unspecified: Secondary | ICD-10-CM | POA: Insufficient documentation

## 2010-03-07 DIAGNOSIS — E782 Mixed hyperlipidemia: Secondary | ICD-10-CM | POA: Insufficient documentation

## 2010-03-07 DIAGNOSIS — E669 Obesity, unspecified: Secondary | ICD-10-CM

## 2010-03-07 DIAGNOSIS — R197 Diarrhea, unspecified: Secondary | ICD-10-CM

## 2010-03-07 DIAGNOSIS — M949 Disorder of cartilage, unspecified: Secondary | ICD-10-CM

## 2010-03-07 DIAGNOSIS — E559 Vitamin D deficiency, unspecified: Secondary | ICD-10-CM | POA: Insufficient documentation

## 2010-03-07 DIAGNOSIS — E1139 Type 2 diabetes mellitus with other diabetic ophthalmic complication: Secondary | ICD-10-CM | POA: Insufficient documentation

## 2010-03-07 DIAGNOSIS — D509 Iron deficiency anemia, unspecified: Secondary | ICD-10-CM | POA: Insufficient documentation

## 2010-03-07 DIAGNOSIS — I517 Cardiomegaly: Secondary | ICD-10-CM

## 2010-03-07 DIAGNOSIS — E785 Hyperlipidemia, unspecified: Secondary | ICD-10-CM

## 2010-03-07 DIAGNOSIS — E1169 Type 2 diabetes mellitus with other specified complication: Secondary | ICD-10-CM | POA: Insufficient documentation

## 2010-03-07 DIAGNOSIS — J029 Acute pharyngitis, unspecified: Secondary | ICD-10-CM | POA: Insufficient documentation

## 2010-03-07 DIAGNOSIS — E212 Other hyperparathyroidism: Secondary | ICD-10-CM | POA: Insufficient documentation

## 2010-03-07 DIAGNOSIS — R32 Unspecified urinary incontinence: Secondary | ICD-10-CM | POA: Insufficient documentation

## 2010-03-07 DIAGNOSIS — G250 Essential tremor: Secondary | ICD-10-CM | POA: Insufficient documentation

## 2010-03-07 DIAGNOSIS — M899 Disorder of bone, unspecified: Secondary | ICD-10-CM | POA: Insufficient documentation

## 2010-03-07 DIAGNOSIS — M169 Osteoarthritis of hip, unspecified: Secondary | ICD-10-CM | POA: Insufficient documentation

## 2010-03-07 DIAGNOSIS — D508 Other iron deficiency anemias: Secondary | ICD-10-CM | POA: Insufficient documentation

## 2010-03-08 ENCOUNTER — Encounter: Payer: Self-pay | Admitting: Family Medicine

## 2010-03-15 NOTE — Assessment & Plan Note (Signed)
Summary: New est care/dt [facility]   Vital Signs:  Patient profile:   65 year old female Height:      59 inches (149.86 cm) Weight:      207.75 pounds (94.43 kg) O2 Sat:      95 % on Room air Temp:     98.4 degrees F (36.89 degrees C) oral Pulse rate:   79 / minute BP sitting:   128 / 79  (right arm) Cuff size:   regular  Vitals Entered By: Josph Macho RMA (March 07, 2010 8:35 AM)  O2 Flow:  Room air CC: Establish new patient/ Diarhea x 1 week, Sore throat X6 days, cough w/phlegm (yellow)/ CF Is Patient Diabetic? No   History of Present Illness: Patient is a 65yo Caucasian female in today to establish car ill tode with a complicated history. She is acutely and in need of treatment for a weeks worth of several stool daily for the past week, as well as a 6 day history of sore throat, cough is productive of yellow phlegm. She denies any fevers, chills, malaise but has struggled with numerous medical problems over the last several years and she is concerned about getting sicker. She notes on going trouble with muscle cramps in hands, arms and legs at times, usually worse at night. She has had numerous abdominal surgeries and reconstruction for recurrent infections and reconstruction. She did have MRSA wounds on her abdomen during all of this. She has developed PTSD due to all of her surgeries, illness and pain and sees Dr Loralie Champ[facility] for her depression over her current statf health and  she is still working despite her difficulties. No CP/palp/SOB/HA. Does struggle with occasional urinary incontinence. Denies suicidal ideation  Preventive Screening-Counseling & Management  Alcohol-Tobacco     Smoking Status: never  Caffeine-Diet-Exercise     Does Patient Exercise: no      Drug Use:  no.    Current Problems (verified): 1)  Tremor, Essential  (ICD-333.1) 2)  Diarrhea  (ICD-787.91) 3)  Unspecified Urinary Incontinence  (ICD-788.30) 4)  Anemia, Iron Deficiency  (ICD-280.9) 5)   Dyspnea  (ICD-786.05) 6)  Obesity  (ICD-278.00) 7)  Esophageal Reflux  (ICD-530.81) 8)  Fibromyalgia  (ICD-729.1) 9)  Asthma  (ICD-493.90) 10)  Allergic Rhinitis  (ICD-477.9) 11)  Sleep Apnea, Obstructive  (ICD-327.23)  Current Medications (verified): 1)  Bd Tb Syringe 27g X 1/2" 1 Ml  Misc (Tuberculin-Allergy Syringes) .... Use As Directed 2)  Clonazepam 1 Mg Tabs (Clonazepam) .[name].. 1 At Bedtime 3)  Cymbalta 60 Mg Cpep (Duloxetine Hcl) .... Take 1 Tablet By Mouth Once A Day 4)  Advair Diskus 250-50 Mcg/dose  Misc (Fluticasone-Salmeterol) .... Inhale 1 Puff Two Times A Day 5)  Singulair 10 Mg  Tabs (Montelukast Sodium) .... Take 1 Tablet By Mouth Once A Day 6)  Allergy Vaccine 1:10 G.o. (W-E) .... Once Weekly 7)  Protonix 40 Mg Tbec (Pantoprazole Sodium) .... Take 1 By Mouth Once Daily 8)  Trazodone Hcl 50 Mg Tabs (Trazodone Hcl) .... Take 1/2 By Mouth At Bedtime 9)  Calcitriol 0.5 Mcg Caps (Calcitriol) .... Take 1 By Mouth Once Daily 10)  Alprazolam 0.25 Mg Tabs (Alprazolam) .... Take 1 By Mouth Once Daily 11)  Cpap .... Set On 12 [facility] 12)  Patanase 0.6 % Soln (Olopatadine Hcl) .[name].. 1 To 2 Sprays Each Nostril Once Daily 13)  Xyzal 5 Mg Tabs (Levocetirizine Dihydrochloride) .[name].. 1 Daily Antinhistamine, As Needed 14)  Vitamin B12 .... Sub  Allergies (verified): 1)  ! Pcn 2)  ! Codeine 3)  ! * Erthromyci N 4)  ! Demerol 5)  ! Keflex 6)  ! Levaquin 7)  ! Sulfa 8)  ! Percocet  Past History:  Past Surgical History: Bariatric Surgery, Duodenal Switch in 2003, revision in 2004 Lumbar disk L4-5 2008 partial discectomy and repeat L4-5 complete discectomy, L5-S1 was 2009 complete discectomy cervical disk, C5 in 2005, C6 2000, Bladder sling- complicated by bowel nick/ clolostomy 2011, June 10 and repeat done on 6/13 for nicked bowel required a colostomy, reversed on 11/07/09. Appendectomy 1961 Cholecystectomy 1980 Carpal tunnel release, Right Paniculectomy, hernia repair, umbilical,  left inguinal  took 9 months to heal skin on abdomen  Abdominal reconstruction, by Tawanna Cooler Heniford in 2008.  Family History:  Father: deceased@70 , CHF, DM, throat cancer, cig, alcohol Mother: deceased@49 , accidental med overdose, ETOH, cig, Emphysema, bipolar depression Siblings:  Sister: deceased@54 , intentional overdose Brother: 57, hyperlipidemia, overweight, HTN MGM: deceased older, unknown causes MGF: deceased in mid 73s, [place] PGM: deceased late 62s, [place] PGF: deceased in early 72s, [place] Children: Son: 37, A&W Son: 44, A&W, psoriatic arthritis Son: 55, A&W, psoriatic arthritis, obesity    Social History: Married Patient never smoked. Parents smoked. Drug use-no Regular exercise-no Alcohol use-yes, special occasions Occupation: Lobbyist Drug Use:  no Does Patient Exercise:  no Occupation:  employed  Physical Exam  General:  Well-developed,well-nourished,in no acute distress; alert,appropriate and cooperative throughout examination Head:  Normocephalic and atraumatic without obvious abnormalities. No apparent alopecia or balding. Eyes:  No corneal or conjunctival inflammation noted. EOMI. Perrla.  Ears:  External ear exam shows no significant lesions or deformities.  Otoscopic examination reveals clear canals, tympanic membranes are intact bilaterally without bulging, retraction, inflammation or discharge. Hearing is grossly normal bilaterally. Nose:  External nasal examination shows no deformity or inflammation. Nasal mucosa are pink and moist without lesions or exudates. Mouth:  Oral mucosa and oropharynx without lesions or exudates.  Teeth in good repair. Neck:  No deformities, masses, or tenderness noted. Lungs:  Normal respiratory effort, chest expands symmetrically. Lungs are clear to auscultation, no crackles or wheezes. Heart:  Normal rate and regular rhythm. S1 and S2 normal without gallop, click, rub or other extra sounds. Abdomen:  Bowel sounds  positive,abdomen soft and non-tender without masses, organomegaly or hernias noted. Msk:  No deformity or scoliosis noted of thoracic or lumbar spine.   Pulses:  R and L carotid,radial,femoral,dorsalis pedis and posterior tibial pulses are full and equal bilaterally Extremities:  No clubbing, cyanosis, edema, or deformity noted with normal full range of motion of all joints.   Neurologic:  No cranial nerve deficits noted. Station and gait are normal. Plantar reflexes are down-going bilaterally. DTRs are symmetrical throughout. Sensory, motor and coordinative functions appear intact. Skin:  Intact without suspicious lesions or rashes, scars on abdomen Cervical Nodes:  No lymphadenopathy noted Psych:  Cognition and judgment appear intact. Alert and cooperative with normal attention span and concentration. No apparent delusions, illusions, hallucinations   Impression & Recommendations:  Problem # 1:  ESSENTIAL HYPERTENSION, BENIGN (ICD-401.1) Well controlled, no changes  Problem # 2:  DM (ICD-250.00) Needs hgba1c to evaluate, avoid simple carbs  Problem # 3:  DIARRHEA (ICD-787.91)  Her updated medication list for this problem includes:    Align Caps (Probiotic product) .[name]... 1 cap by mouth daily Benefiber two times a day and report worsening symptoms  Problem # 4:  ACUTE PHARYNGITIS (ICD-462)  Her updated medication  list for this problem includes:    Zithromax 250 Mg Tabs (Azithromycin) .[name]... 2 tabs by mouth once and then 1 tab by mouth daily x 4 days Increase clear fluids  Problem # 5:  ANEMIA, IRON DEFICIENCY (ICD-280.9) Check a CBC and reeval  Problem # 6:  TREMOR, ESSENTIAL (ICD-333.1) Improved on Clonazepam, no changes  Complete Medication List: 1)  Bd Tb Syringe 27g X 1/2" 1 Ml Misc (Tuberculin-allergy syringes) .... Use as directed 2)  Clonazepam 1 Mg Tabs (Clonazepam) .[name].. 1 at bedtime 3)  Cymbalta 60 Mg Cpep (Duloxetine hcl) .... Take 1 tablet by mouth once a day 4)   Advair Diskus 250-50 Mcg/dose Misc (Fluticasone-salmeterol) .... Inhale 1 puff two times a day 5)  Singulair 10 Mg Tabs (Montelukast sodium) .... Take 1 tablet by mouth once a day 6)  Allergy Vaccine 1:10 G.o. (w-e)  .... Once weekly 7)  Protonix 40 Mg Tbec (Pantoprazole sodium) .... Take 1 by mouth once daily 8)  Trazodone Hcl 50 Mg Tabs (Trazodone hcl) .... Take 1/2 by mouth at bedtime 9)  Calcitriol 0.5 Mcg Caps (Calcitriol) .... Take 1 by mouth once daily 10)  Alprazolam 0.25 Mg Tabs (Alprazolam) .... Take 1 by mouth once daily 11)  Cpap  .... Set on 12 [facility] 12)  Patanase 0.6 % Soln (Olopatadine hcl) .[name].. 1 to 2 sprays each nostril once daily 13)  Xyzal 5 Mg Tabs (Levocetirizine dihydrochloride) .[name].. 1 daily antinhistamine, as needed 14)  Vitamin B12  .... Sub 15)  Zithromax 250 Mg Tabs (Azithromycin) .... 2 tabs by mouth once and then 1 tab by mouth daily x 4 days 16)  Mucinex 600 Mg Xr12h-tab (Guaifenesin) .[name].. 1 tab by mouth two times a day x 10 day 17)  Benefiber Powd (Wheat dextrin) .... 2 tsp by mouth two times a day in fluids 18)  Align Caps (Probiotic product) .[name].. 1 cap by mouth daily  Patient Instructions: 1)  Please schedule a follow-up appointment in 2- 4  weeks 2)  Release of Records, Dr Talmage Nap, especially labs 3)  Take your antibiotic as prescribed until ALL of it is gone, but stop if you develop a rash or swelling and contact our office as soon as possible.  4)  Acute Bronchitis symptoms for less then 10 days are not  helped by antibiotics. Take over the counter cough medications. Call if no improvement in 5-7 days, sooner if increasing cough, fever, or new symptoms ( shortness of breath, chest pain) .  5)  Gatorade daily 6)  Hyland's nighttime leg cramp medicine as needed for cramps Prescriptions: ZITHROMAX 250 MG TABS (AZITHROMYCIN) 2 tabs by mouth once and then 1 tab by mouth daily x 4 days  #6 x 0   Entered and Authorized by:   Danise Edge MD   Signed by:   Danise Edge MD on 03/07/2010   Method used:   Electronically to        [facility]. [phone]* (retail)       [phone] [address]       [place], [place]  [address]       Ph: [phone]       Fax: [phone]   RxID:   [id number]    Orders Added: 1)  New Patient Level IV [02725]    Preventive Care Screening  Pap Smear:    Date:  02/27/2010    Results:  historical   Mammogram:  Date:  11/22/2009    Results:  historical   Last Pneumovax:    Date:  10/22/2009    Results:  historical   Last Flu Shot:    Date:  10/22/2009    Results:  historical   Colonoscopy:    Date:  09/22/2009    Results:  historical

## 2010-03-21 ENCOUNTER — Ambulatory Visit (INDEPENDENT_AMBULATORY_CARE_PROVIDER_SITE_OTHER): Payer: BC Managed Care – PPO | Admitting: Family Medicine

## 2010-03-21 ENCOUNTER — Encounter: Payer: Self-pay | Admitting: Family Medicine

## 2010-03-21 DIAGNOSIS — R159 Full incontinence of feces: Secondary | ICD-10-CM | POA: Insufficient documentation

## 2010-03-21 DIAGNOSIS — J029 Acute pharyngitis, unspecified: Secondary | ICD-10-CM

## 2010-03-21 DIAGNOSIS — L2089 Other atopic dermatitis: Secondary | ICD-10-CM

## 2010-03-21 DIAGNOSIS — R197 Diarrhea, unspecified: Secondary | ICD-10-CM

## 2010-03-21 DIAGNOSIS — E119 Type 2 diabetes mellitus without complications: Secondary | ICD-10-CM

## 2010-03-21 DIAGNOSIS — D649 Anemia, unspecified: Secondary | ICD-10-CM | POA: Insufficient documentation

## 2010-03-21 DIAGNOSIS — I10 Essential (primary) hypertension: Secondary | ICD-10-CM

## 2010-03-22 ENCOUNTER — Encounter (HOSPITAL_BASED_OUTPATIENT_CLINIC_OR_DEPARTMENT_OTHER): Payer: BC Managed Care – PPO | Admitting: Hematology and Oncology

## 2010-03-22 ENCOUNTER — Ambulatory Visit: Payer: BC Managed Care – PPO | Admitting: Family Medicine

## 2010-03-22 ENCOUNTER — Other Ambulatory Visit: Payer: Self-pay | Admitting: Hematology and Oncology

## 2010-03-22 DIAGNOSIS — D509 Iron deficiency anemia, unspecified: Secondary | ICD-10-CM

## 2010-03-22 LAB — CBC & DIFF AND RETIC
Basophils Absolute: 0.1 10*3/uL (ref 0.0–0.1)
Eosinophils Absolute: 0.4 10*3/uL (ref 0.0–0.5)
HCT: 31.2 % — ABNORMAL LOW (ref 34.8–46.6)
HGB: 9.5 g/dL — ABNORMAL LOW (ref 11.6–15.9)
LYMPH%: 21.5 % (ref 14.0–49.7)
MONO#: 0.5 10*3/uL (ref 0.1–0.9)
NEUT#: 5.4 10*3/uL (ref 1.5–6.5)
NEUT%: 66.9 % (ref 38.4–76.8)
Platelets: 445 10*3/uL — ABNORMAL HIGH (ref 145–400)
Retic %: 1.13 % (ref 0.50–1.50)
WBC: 8.1 10*3/uL (ref 3.9–10.3)

## 2010-03-22 LAB — URINALYSIS, MICROSCOPIC - CHCC
Bilirubin (Urine): NEGATIVE
Blood: NEGATIVE
Glucose: NEGATIVE g/dL
Ketones: NEGATIVE mg/dL
Leukocyte Esterase: NEGATIVE
Specific Gravity, Urine: 1.01 (ref 1.003–1.035)

## 2010-03-24 ENCOUNTER — Encounter (HOSPITAL_BASED_OUTPATIENT_CLINIC_OR_DEPARTMENT_OTHER): Payer: BC Managed Care – PPO | Admitting: Hematology and Oncology

## 2010-03-24 ENCOUNTER — Encounter: Payer: Self-pay | Admitting: Family Medicine

## 2010-03-24 DIAGNOSIS — D509 Iron deficiency anemia, unspecified: Secondary | ICD-10-CM

## 2010-03-24 LAB — IRON AND TIBC
%SAT: 4 % — ABNORMAL LOW (ref 20–55)
TIBC: 443 ug/dL (ref 250–470)

## 2010-03-24 LAB — COMPREHENSIVE METABOLIC PANEL
ALT: 12 U/L (ref 0–35)
AST: 15 U/L (ref 0–37)
Calcium: 8.5 mg/dL (ref 8.4–10.5)
Chloride: 106 mEq/L (ref 96–112)
Creatinine, Ser: 1.26 mg/dL — ABNORMAL HIGH (ref 0.40–1.20)
Total Bilirubin: 0.3 mg/dL (ref 0.3–1.2)

## 2010-03-24 LAB — PROTEIN ELECTROPHORESIS, SERUM, WITH REFLEX
Alpha-1-Globulin: 5.3 % — ABNORMAL HIGH (ref 2.9–4.9)
Alpha-2-Globulin: 12.4 % — ABNORMAL HIGH (ref 7.1–11.8)
Total Protein, Serum Electrophoresis: 6.6 g/dL (ref 6.0–8.3)

## 2010-03-24 LAB — HAPTOGLOBIN: Haptoglobin: 173 mg/dL (ref 16–200)

## 2010-03-24 LAB — VITAMIN B12: Vitamin B-12: 1475 pg/mL — ABNORMAL HIGH (ref 211–911)

## 2010-03-28 DIAGNOSIS — R197 Diarrhea, unspecified: Secondary | ICD-10-CM

## 2010-03-28 DIAGNOSIS — R159 Full incontinence of feces: Secondary | ICD-10-CM

## 2010-03-29 ENCOUNTER — Encounter: Payer: Self-pay | Admitting: Family Medicine

## 2010-03-30 ENCOUNTER — Encounter: Payer: BC Managed Care – PPO | Admitting: Hematology and Oncology

## 2010-03-30 DIAGNOSIS — Z9884 Bariatric surgery status: Secondary | ICD-10-CM

## 2010-03-30 NOTE — Letter (Signed)
Summary: Darnell Level MD  Darnell Level MD   Imported By: Lester  03/24/2010 08:40:36  _____________________________________________________________________  External Attachment:    Type:   Image     Comment:   External Document

## 2010-03-30 NOTE — Letter (Signed)
Summary: Previous Records  Previous Records   Imported By: Lester [place] 03/20/2010 07:42:07  _____________________________________________________________________  External Attachment:    Type:   Image     Comment:   External Document

## 2010-03-30 NOTE — Assessment & Plan Note (Signed)
Summary: follow up appt   Vital Signs:  Patient profile:   65 year old female Height:      59 inches (149.86 cm) Weight:      212.50 pounds (96.59 kg) O2 Sat:      97 % on Room air Temp:     97.8 degrees F (36.56 degrees C) oral Pulse rate:   85 / minute BP sitting:   144 / 79  (right arm) Cuff size:   large  Vitals Entered By: Josph Macho RMA (March 21, 2010 8:09 AM)  O2 Flow:  Room air CC: Follow-up visit/ Pt states she has itchy blisters around neck and top of back X off and on for weeks/ CF Is Patient Diabetic? No   History of Present Illness: Patient is a 65 year old Caucasian female who is today for followup on her new patient appointment. She reports her sore throat congestion and cough all improved dramatically with her antibiotic treatment but her symptoms have not completely resolved. Her fever and malaise are greatly improved her sore throat is tremendously better but she continues to have an aching usually dry cough occasionally she notes the cough is productive of clear phlegm at this time usually from a sensation of postnasal drip. Unfortunately when her cough was at its worse it worsened her urinary incontinence again. She also developed increased frequency and intensity of loose stooling. She was undergoing physical therapy with the line for pelvic floor dysfunction and was seen great improvement with the cough she has had a major setback she is having increased trouble with urinary incontinence and now with the loose stool Notes some fecal incontinence as well this is new. Prior to her initial visit here she would have roughly 2 stools a day with minimal urgency. She reports she's having 4-5 day they're loose she describes them as explosive and foamy upon questioning perhaps somewhat greasy. She denies any bloody or tarry. She is cramping prior to the movement but not cramping regularly. No fevers but fatigue is still present. She is noting increased pruritus in skin  with some small papular sometimes vesicular lesions occuring randomly on her upper torso and arms. She scratches them at times and they will drain clear liquid, the patient's husband does not have similar lesions and hers do not occur on lower extremities. Her psychiatrist has recently started her on Xanax 1mg  at bedtime and she notes this helps her sleep and pruritus. She is still very emotionally drained by her complex medical history and complications over the past year. She start Benefiber, Align and yogurt as directed at last vist. She feels the Align makes the loose stool worse but the other 2 help some. No CP/palp/SOB.  Current Medications (verified): 1)  Bd Tb Syringe 27g X 1/2" 1 Ml  Misc (Tuberculin-Allergy Syringes) .... Use As Directed 2)  Cymbalta 60 Mg Cpep (Duloxetine Hcl) .... Take 1 Tablet By Mouth Once A Day 3)  Advair Diskus 250-50 Mcg/dose  Misc (Fluticasone-Salmeterol) .... Inhale 1 Puff Two Times A Day 4)  Singulair 10 Mg  Tabs (Montelukast Sodium) .... Take 1 Tablet By Mouth Once A Day 5)  Allergy Vaccine 1:10 G.o. (W-E) .... Once Weekly 6)  Protonix 40 Mg Tbec (Pantoprazole Sodium) .... Take 1 By Mouth Once Daily 7)  Trazodone Hcl 50 Mg Tabs (Trazodone Hcl) .... Take 1/2 By Mouth At Bedtime 8)  Calcitriol 0.5 Mcg Caps (Calcitriol) .... Take 1 By Mouth Once Daily 9)  Alprazolam 0.25 Mg Tabs (Alprazolam) .[name] KitchenMarland KitchenMarland Kitchen  Take 1 By Mouth Once Daily 10)  Cpap .... Set On 12 [facility] 11)  Patanase 0.6 % Soln (Olopatadine Hcl) .[name].. 1 To 2 Sprays Each Nostril Once Daily 12)  Xyzal 5 Mg Tabs (Levocetirizine Dihydrochloride) .[name].. 1 Daily Antinhistamine, As Needed 13)  Vitamin B12 .... Sub 14)  Zithromax 250 Mg Tabs (Azithromycin) .... 2 Tabs By Mouth Once and Then 1 Tab By Mouth Daily X 4 Days 15)  Mucinex 600 Mg Xr12h-Tab (Guaifenesin) .[name].. 1 Tab By Mouth Two Times A Day X 10 Day 16)  Benefiber  Powd (Wheat Dextrin) .... 2 Tsp By Mouth Two Times A Day in Fluids 17)  Align  Caps (Probiotic  Product) .[name].. 1 Cap By Mouth Daily 18)  Toviaz 8 Mg Xr24h-Tab (Fesoterodine Fumarate) .... Once Daily  Allergies (verified): 1)  ! Pcn 2)  ! Codeine 3)  ! * Erthromyci N 4)  ! Demerol 5)  ! Keflex 6)  ! Levaquin 7)  ! Sulfa 8)  ! Percocet  Past History:  Past medical history reviewed for relevance to current acute and chronic problems. Social history (including risk factors) reviewed for relevance to current acute and chronic problems.  Past Medical History: Reviewed history from 11/19/2007 and no changes required. OBESITY (ICD-278.00) ESOPHAGEAL REFLUX (ICD-530.81) FIBROMYALGIA (ICD-729.1) ASTHMA (ICD-493.90) ALLERGIC RHINITIS (ICD-477.9) SLEEP APNEA, OBSTRUCTIVE (ICD-327.23)  Social History: Reviewed history from 03/07/2010 and no changes required. Married Patient never smoked. Parents smoked. Drug use-no Regular exercise-no Alcohol use-yes, special occasions Occupation: Lobbyist  Review of Systems      See HPI  Physical Exam  General:  Well-developed,well-nourished,in no acute distress; alert,appropriate and cooperative throughout examination Head:  Normocephalic and atraumatic without obvious abnormalities.  Mouth:  Oral mucosa and oropharynx without lesions or exudates.   Lungs:  Normal respiratory effort, chest expands symmetrically. Lungs are clear to auscultation, no crackles or wheezes. Heart:  Normal rate and regular rhythm. S1 and S2 normal without gallop, click, rub or other extra sounds.grade 1-2 /6 systolic murmur.   Abdomen:  Bowel sounds positive x 4 quadrants, tender to palpation diffusely but soft, no rebound or guarding.  midline vertical scar as well as mulitple other surgical scars. small, tender round nodules noted in Subcutaneously tissue, scattered over abd wall more prominent on right side. semimobile, not fluctuant or warm. Extremities:  No clubbing, cyanosis, edema, or deformity noted with normal full range of motion of all  joints.     Impression & Recommendations:  Problem # 1:  DIARRHEA (ICD-787.91)  The following medications were removed from the medication list:    Align Caps (Probiotic product) .[name]... 1 cap by mouth daily  Orders: T-Culture, C-Diff Toxin A/B ([id number]) T-Culture, Stool ([id number]) T-Culture, Giardia / Cryptosporidium ([id number]) T-Lactoferrin Fecal, qual ([id number]) Patient at risk for CDiff will await stool cultures and patient is encouraged to obtain lactose free probiotic, increase Benefiber to three times a day and continue her yogurt. If stool cultures are negative may use occasional doses of Imodium to manage symptoms  Problem # 2:  UNSPECIFIED URINARY INCONTINENCE (ICD-788.30) Improved with Toviaz but recently flared secondary to increased intrabdominal pressure created by coughing. Encouraged increased Kegel exercises three times a day and offered a referral for a new physical therapy group to continue bladder retraining and biofeedback declines for now but may consider in future  Problem # 3:  DERMATITIS, ATOPIC (ICD-691.8)  Her updated medication list for this problem includes:    Triamcinolone Acetonide 0.1 % Oint (Triamcinolone acetonide) .[name] KitchenMarland KitchenMarland KitchenMarland Kitchen  Apply small amount to affected three times a day. Eczematic vs stress induced are the most likely diagnoses. Encourged cleansing with [facility] Astringent and applying Sarna lotion if no improvement then may use small amounts of Triamcinolone and report worsening symptoms.  Problem # 4:  ANEMIA (ICD-285.9) Patient reports a long history of recurrent anemia, she recently had labs drawn at an outside facility with Dr Talmage Nap which confirmed her anemia is worsening again. She reports her hgb dropped from 10 to 9 over a 3 month window and 6 months ago was normal. We will have her sign a release of records to obtain those numbers. She reports a previous intolerance and/or lack of response to oral iron thus requiring IV iron to  normalize. She has previously obtained these infusions through Heme/onc Dr Belva Crome. She will call if she requires referral   Problem # 5:  ESSENTIAL HYPERTENSION, BENIGN (ICD-401.1) Mild elevation with stressors will reevaluate at next visit.  Problem # 6:  ACUTE PHARYNGITIS (ICD-462)  Her updated medication list for this problem includes:    Zithromax 250 Mg Tabs (Azithromycin) .[name]... 2 tabs by mouth once and then 1 tab by mouth daily x 4 days Resolved with Azithromycin, cough is lingering but improving, no further treatment at this time  Complete Medication List: 1)  Bd Tb Syringe 27g X 1/2" 1 Ml Misc (Tuberculin-allergy syringes) .... Use as directed 2)  Cymbalta 60 Mg Cpep (Duloxetine hcl) .... Take 1 tablet by mouth once a day 3)  Advair Diskus 250-50 Mcg/dose Misc (Fluticasone-salmeterol) .... Inhale 1 puff two times a day 4)  Singulair 10 Mg Tabs (Montelukast sodium) .... Take 1 tablet by mouth once a day 5)  Allergy Vaccine 1:10 G.o. (w-e)  .... Once weekly 6)  Protonix 40 Mg Tbec (Pantoprazole sodium) .... Take 1 by mouth once daily 7)  Trazodone Hcl 50 Mg Tabs (Trazodone hcl) .... Take 1/2 by mouth at bedtime 8)  Calcitriol 0.5 Mcg Caps (Calcitriol) .... Take 1 by mouth once daily 9)  Alprazolam 0.25 Mg Tabs (Alprazolam) .... Take 1 by mouth once daily 10)  Cpap  .... Set on 12 [facility] 11)  Patanase 0.6 % Soln (Olopatadine hcl) .[name].. 1 to 2 sprays each nostril once daily 12)  Xyzal 5 Mg Tabs (Levocetirizine dihydrochloride) .[name].. 1 daily antinhistamine, as needed 13)  Vitamin B12  .... Sub 14)  Zithromax 250 Mg Tabs (Azithromycin) .... 2 tabs by mouth once and then 1 tab by mouth daily x 4 days 15)  Mucinex 600 Mg Xr12h-tab (Guaifenesin) .[name].. 1 tab by mouth two times a day x 10 day 16)  Benefiber Powd (Wheat dextrin) .... 2 tsp by mouth two times a day in fluids 17)  Toviaz 8 Mg Xr24h-tab (Fesoterodine fumarate) .... Once daily 18)  Triamcinolone Acetonide 0.1 % Oint (Triamcinolone  acetonide) .... Apply small amount to affected three times a day  Patient Instructions: 1)  Please schedule a follow-up appointment in 2 to 4 weeks or sooner as needed. 2)  Increase Benefiber to 2 tsp by mouth three times a day  3)  Start a lactose free probiotic 4)  Continue yogurt 5)  Kegel exercises three times a day  6)  Try Sarna anti- itch lotion 7)  Try Witch Hazel Astringent for itching 8)  Release of Records Dr Talmage Nap labs since June of 2011 Prescriptions: TRIAMCINOLONE ACETONIDE 0.1 % OINT (TRIAMCINOLONE ACETONIDE) apply small amount to affected three times a day  #60 gm x 1  Entered and Authorized by:   Danise Edge MD   Signed by:   Danise Edge MD on 03/21/2010   Method used:   Electronically to        [facility]. [phone]* (retail)       [phone] [address]       [place], [place]  [address]       Ph: [phone]       Fax: [phone]   RxID:   [id number]    Orders Added: 1)  T-Culture, C-Diff Toxin A/B [id number] 2)  T-Culture, Stool [[id number]] 3)  T-Culture, Giardia / Cryptosporidium [[id number]] 4)  T-Lactoferrin Fecal, qual [[id number]] 5)  Est. Patient Level IV [51884]

## 2010-04-05 LAB — CARDIAC PANEL(CRET KIN+CKTOT+MB+TROPI)
CK, MB: 1.4 ng/mL (ref 0.3–4.0)
CK, MB: 1.4 ng/mL (ref 0.3–4.0)
Relative Index: 1.1 (ref 0.0–2.5)
Relative Index: 2.1 (ref 0.0–2.5)
Relative Index: INVALID (ref 0.0–2.5)
Total CK: 101 U/L (ref 7–177)
Total CK: 108 U/L (ref 7–177)
Troponin I: 0.03 ng/mL (ref 0.00–0.06)

## 2010-04-05 LAB — BASIC METABOLIC PANEL
BUN: 9 mg/dL (ref 6–23)
CO2: 31 mEq/L (ref 19–32)
Calcium: 8.2 mg/dL — ABNORMAL LOW (ref 8.4–10.5)
GFR calc Af Amer: >60 mL/min (ref >60)
GFR calc Af Amer: >60 mL/min (ref >60)
GFR calc non Af Amer: >60 mL/min (ref >60)
GFR calc non Af Amer: >60 mL/min (ref >60)
Potassium: 4 mEq/L (ref 3.5–5.1)
Potassium: 4.2 mEq/L (ref 3.5–5.1)
Sodium: 139 mEq/L (ref 135–145)
Sodium: 140 mEq/L (ref 135–145)

## 2010-04-05 LAB — TYPE AND SCREEN

## 2010-04-06 ENCOUNTER — Encounter: Payer: Self-pay | Admitting: Family Medicine

## 2010-04-06 ENCOUNTER — Telehealth: Payer: Self-pay | Admitting: Family Medicine

## 2010-04-06 ENCOUNTER — Ambulatory Visit (INDEPENDENT_AMBULATORY_CARE_PROVIDER_SITE_OTHER): Payer: BC Managed Care – PPO | Admitting: Family Medicine

## 2010-04-06 DIAGNOSIS — R059 Cough, unspecified: Secondary | ICD-10-CM

## 2010-04-06 DIAGNOSIS — I10 Essential (primary) hypertension: Secondary | ICD-10-CM

## 2010-04-06 DIAGNOSIS — R1084 Generalized abdominal pain: Secondary | ICD-10-CM | POA: Insufficient documentation

## 2010-04-06 DIAGNOSIS — R05 Cough: Secondary | ICD-10-CM | POA: Insufficient documentation

## 2010-04-06 DIAGNOSIS — D649 Anemia, unspecified: Secondary | ICD-10-CM

## 2010-04-06 DIAGNOSIS — K439 Ventral hernia without obstruction or gangrene: Secondary | ICD-10-CM | POA: Insufficient documentation

## 2010-04-06 LAB — PROTIME-INR: INR: 1.13 (ref 0.00–1.49)

## 2010-04-06 LAB — URINALYSIS, ROUTINE W REFLEX MICROSCOPIC
Glucose, UA: NEGATIVE mg/dL
Ketones, ur: NEGATIVE mg/dL
Nitrite: NEGATIVE
Protein, ur: NEGATIVE mg/dL

## 2010-04-06 LAB — CBC
MCHC: 32.6 g/dL (ref 30.0–36.0)
Platelets: 236 10*3/uL (ref 150–400)
RDW: 14.5 % (ref 11.5–15.5)

## 2010-04-06 LAB — DIFFERENTIAL
Basophils Absolute: 0 10*3/uL (ref 0.0–0.1)
Basophils Relative: 1 % (ref 0–1)
Neutro Abs: 3.4 10*3/uL (ref 1.7–7.7)
Neutrophils Relative %: 60 % (ref 43–77)

## 2010-04-06 LAB — BASIC METABOLIC PANEL
BUN: 11 mg/dL (ref 6–23)
Calcium: 8.9 mg/dL (ref 8.4–10.5)
GFR calc non Af Amer: >60 mL/min (ref >60)
Glucose, Bld: 69 mg/dL — ABNORMAL LOW (ref 70–99)
Sodium: 144 mEq/L (ref 135–145)

## 2010-04-07 ENCOUNTER — Ambulatory Visit (INDEPENDENT_AMBULATORY_CARE_PROVIDER_SITE_OTHER)
Admission: RE | Admit: 2010-04-07 | Discharge: 2010-04-07 | Disposition: A | Discharge status: Home / Self Care | Payer: BC Managed Care – PPO | Source: Ambulatory Visit | Attending: Family Medicine | Admitting: Family Medicine

## 2010-04-07 ENCOUNTER — Other Ambulatory Visit: Payer: Self-pay | Admitting: Family Medicine

## 2010-04-07 DIAGNOSIS — R059 Cough, unspecified: Secondary | ICD-10-CM

## 2010-04-07 DIAGNOSIS — R05 Cough: Secondary | ICD-10-CM

## 2010-04-09 ENCOUNTER — Encounter: Payer: Self-pay | Admitting: Family Medicine

## 2010-04-09 LAB — CBC
HCT: 27.9 % — ABNORMAL LOW (ref 36.0–46.0)
HCT: 29.2 % — ABNORMAL LOW (ref 36.0–46.0)
Hemoglobin: 9.6 g/dL — ABNORMAL LOW (ref 12.0–15.0)
MCHC: 32 g/dL (ref 30.0–36.0)
MCHC: 34.3 g/dL (ref 30.0–36.0)
MCV: 84.6 fL (ref 78.0–100.0)
Platelets: 194 10*3/uL (ref 150–400)
Platelets: 365 10*3/uL (ref 150–400)
RBC: 3.3 MIL/uL — ABNORMAL LOW (ref 3.87–5.11)
RDW: 14.1 % (ref 11.5–15.5)
RDW: 14.4 % (ref 11.5–15.5)
WBC: 9 10*3/uL (ref 4.0–10.5)

## 2010-04-09 LAB — COMPREHENSIVE METABOLIC PANEL
ALT: 120 U/L — ABNORMAL HIGH (ref 0–35)
ALT: 158 U/L — ABNORMAL HIGH (ref 0–35)
AST: 42 U/L — ABNORMAL HIGH (ref 0–37)
Albumin: 2.4 g/dL — ABNORMAL LOW (ref 3.5–5.2)
Alkaline Phosphatase: 243 U/L — ABNORMAL HIGH (ref 39–117)
BUN: 4 mg/dL — ABNORMAL LOW (ref 6–23)
CO2: 26 mEq/L (ref 19–32)
CO2: 28 mEq/L (ref 19–32)
Calcium: 7.5 mg/dL — ABNORMAL LOW (ref 8.4–10.5)
Calcium: 8.1 mg/dL — ABNORMAL LOW (ref 8.4–10.5)
Chloride: 104 mEq/L (ref 96–112)
Creatinine, Ser: 0.56 mg/dL (ref 0.4–1.2)
GFR calc Af Amer: >60 mL/min (ref >60)
GFR calc non Af Amer: >60 mL/min (ref >60)
GFR calc non Af Amer: >60 mL/min (ref >60)
Glucose, Bld: 118 mg/dL — ABNORMAL HIGH (ref 70–99)
Glucose, Bld: 438 mg/dL — ABNORMAL HIGH (ref 70–99)
Potassium: 3.5 mEq/L (ref 3.5–5.1)
Sodium: 133 mEq/L — ABNORMAL LOW (ref 135–145)
Sodium: 137 mEq/L (ref 135–145)
Total Bilirubin: 0.4 mg/dL (ref 0.3–1.2)
Total Bilirubin: 0.6 mg/dL (ref 0.3–1.2)
Total Protein: 5.1 g/dL — ABNORMAL LOW (ref 6.0–8.3)

## 2010-04-09 LAB — BASIC METABOLIC PANEL
CO2: 30 mEq/L (ref 19–32)
Calcium: 8.1 mg/dL — ABNORMAL LOW (ref 8.4–10.5)
Chloride: 105 mEq/L (ref 96–112)
Creatinine, Ser: 0.73 mg/dL (ref 0.4–1.2)
Glucose, Bld: 98 mg/dL (ref 70–99)

## 2010-04-09 LAB — DIFFERENTIAL
Basophils Absolute: 0 10*3/uL (ref 0.0–0.1)
Basophils Relative: 1 % (ref 0–1)
Eosinophils Absolute: 0.5 10*3/uL (ref 0.0–0.7)
Eosinophils Relative: 5 % (ref 0–5)
Lymphocytes Relative: 17 % (ref 12–46)
Monocytes Absolute: 0.7 10*3/uL (ref 0.1–1.0)

## 2010-04-09 LAB — GLUCOSE, CAPILLARY
Glucose-Capillary: 106 mg/dL — ABNORMAL HIGH (ref 70–99)
Glucose-Capillary: 121 mg/dL — ABNORMAL HIGH (ref 70–99)
Glucose-Capillary: 143 mg/dL — ABNORMAL HIGH (ref 70–99)
Glucose-Capillary: 161 mg/dL — ABNORMAL HIGH (ref 70–99)

## 2010-04-10 ENCOUNTER — Encounter: Payer: Self-pay | Admitting: Family Medicine

## 2010-04-10 ENCOUNTER — Telehealth: Payer: Self-pay | Admitting: Family Medicine

## 2010-04-10 LAB — COMPREHENSIVE METABOLIC PANEL
ALT: 71 U/L — ABNORMAL HIGH (ref 0–35)
AST: 32 U/L (ref 0–37)
AST: 856 U/L — ABNORMAL HIGH (ref 0–37)
Albumin: 2.9 g/dL — ABNORMAL LOW (ref 3.5–5.2)
Alkaline Phosphatase: 123 U/L — ABNORMAL HIGH (ref 39–117)
CO2: 25 mEq/L (ref 19–32)
CO2: 26 mEq/L (ref 19–32)
Calcium: 7.9 mg/dL — ABNORMAL LOW (ref 8.4–10.5)
Chloride: 108 mEq/L (ref 96–112)
Creatinine, Ser: 0.62 mg/dL (ref 0.4–1.2)
GFR calc Af Amer: >60 mL/min (ref >60)
GFR calc Af Amer: >60 mL/min (ref >60)
GFR calc non Af Amer: >60 mL/min (ref >60)
GFR calc non Af Amer: >60 mL/min (ref >60)
Potassium: 3.2 mEq/L — ABNORMAL LOW (ref 3.5–5.1)
Total Bilirubin: 0.4 mg/dL (ref 0.3–1.2)

## 2010-04-10 LAB — DIFFERENTIAL
Basophils Relative: 0 % (ref 0–1)
Eosinophils Absolute: 0.1 10*3/uL (ref 0.0–0.7)
Lymphocytes Relative: 5 % — ABNORMAL LOW (ref 12–46)
Lymphs Abs: 0.3 10*3/uL — ABNORMAL LOW (ref 0.7–4.0)
Lymphs Abs: 1 10*3/uL (ref 0.7–4.0)
Monocytes Absolute: 0.7 10*3/uL (ref 0.1–1.0)
Monocytes Relative: 8 % (ref 3–12)
Neutro Abs: 8 10*3/uL — ABNORMAL HIGH (ref 1.7–7.7)
Neutrophils Relative %: 81 % — ABNORMAL HIGH (ref 43–77)
Neutrophils Relative %: 89 % — ABNORMAL HIGH (ref 43–77)

## 2010-04-10 LAB — CBC
Hemoglobin: 10.3 g/dL — ABNORMAL LOW (ref 12.0–15.0)
MCHC: 32.4 g/dL (ref 30.0–36.0)
MCHC: 33.2 g/dL (ref 30.0–36.0)
MCV: 88.8 fL (ref 78.0–100.0)
MCV: 88.8 fL (ref 78.0–100.0)
Platelets: 179 10*3/uL (ref 150–400)
RBC: 3.21 MIL/uL — ABNORMAL LOW (ref 3.87–5.11)
RBC: 3.49 MIL/uL — ABNORMAL LOW (ref 3.87–5.11)
RBC: 3.62 MIL/uL — ABNORMAL LOW (ref 3.87–5.11)
RDW: 15 % (ref 11.5–15.5)
WBC: 9.1 10*3/uL (ref 4.0–10.5)
WBC: 9.9 10*3/uL (ref 4.0–10.5)

## 2010-04-10 LAB — URINALYSIS, ROUTINE W REFLEX MICROSCOPIC
Bilirubin Urine: NEGATIVE
Glucose, UA: NEGATIVE mg/dL
Ketones, ur: NEGATIVE mg/dL
Protein, ur: NEGATIVE mg/dL
pH: 6 (ref 5.0–8.0)

## 2010-04-10 LAB — BASIC METABOLIC PANEL
CO2: 27 mEq/L (ref 19–32)
Chloride: 108 mEq/L (ref 96–112)
Creatinine, Ser: 0.71 mg/dL (ref 0.4–1.2)
GFR calc Af Amer: >60 mL/min (ref >60)
Potassium: 3.1 mEq/L — ABNORMAL LOW (ref 3.5–5.1)
Sodium: 141 mEq/L (ref 135–145)

## 2010-04-10 LAB — CROSSMATCH

## 2010-04-10 LAB — WOUND CULTURE

## 2010-04-10 LAB — CULTURE, BLOOD (ROUTINE X 2): Culture: NO GROWTH

## 2010-04-10 LAB — LACTIC ACID, PLASMA: Lactic Acid, Venous: 1.4 mmol/L (ref 0.5–2.2)

## 2010-04-10 LAB — PROCALCITONIN: Procalcitonin: <0.5 ng/mL

## 2010-04-10 LAB — PROLACTIN: Prolactin: 19.5 ng/mL

## 2010-04-10 LAB — URINE MICROSCOPIC-ADD ON

## 2010-04-10 LAB — HIV ANTIBODY (ROUTINE TESTING W REFLEX): HIV: NONREACTIVE

## 2010-04-11 NOTE — Progress Notes (Signed)
Summary: Referral  Phone Note Call from Patient   Summary of Call: Pt called stating that her stomach pain that she mentioned to MD this morning is getting worse. Pt states she called Dr Ricky Stabs (?) and he stated that she would need a referral. Pt states she is in so much pain that she can barely move. I informed pt that she should go to ER or Urgent Care if the pain is that bad. Initial call taken by: Josph Macho RMA,  April 06, 2010 4:00 PM  Follow-up for Phone Call        Agree with trip to ER or Urgent care if pain is overwhelming otherwise we can renew her referral to her surgeon, Dr Darnell Level, put referral through already Follow-up by: Danise Edge MD,  April 07, 2010 9:06 AM  Additional Follow-up for Phone Call Additional follow up Details #1::        Left detailed message on pts answering machine Additional Follow-up by: Josph Macho RMA,  April 07, 2010 10:24 AM  New Problems: ABDOMINAL WALL HERNIA (ICD-553.20) ABDOMINAL PAIN, GENERALIZED (ICD-789.07)   New Problems: ABDOMINAL WALL HERNIA (ICD-553.20) ABDOMINAL PAIN, GENERALIZED (ICD-789.07)  Appended Document: Referral pt states now that Dr Gerrit Friends will not see her since its not by incision site. Pt would like to know if she could go for a c-scan or is something else more reasonable?  Call back [phone]- leave message if no answer.  Appended Document: Referral OK to cancel Dr Gerrit Friends referral and order an abdominal US for pain  Appended Document: Referral pt agreed that an ultrasound would be good. Pt also stated if the pain gets unbearable she will go to ER.  Appended Document: Orders Update    Clinical Lists Changes  Orders: Added new Referral order of Radiology Referral (Radiology) - Signed

## 2010-04-11 NOTE — Letter (Signed)
Summary: Records Dated 10-09 thru 02-12/[facility]  Records Dated 10-09 thru 02-12/[facility]   Imported By: Lanelle Bal 04/06/2010 10:06:46  _____________________________________________________________________  External Attachment:    Type:   Image     Comment:   External Document

## 2010-04-11 NOTE — Assessment & Plan Note (Signed)
Summary: follow   Vital Signs:  Patient profile:   65 year old female Height:      59 inches (149.86 cm) Weight:      205.25 pounds (93.30 kg) O2 Sat:      97 % on Room air Temp:     98.2 degrees F (36.78 degrees C) oral Pulse rate:   78 / minute BP sitting:   128 / 81  (right arm) Cuff size:   large  Vitals Entered By: Josph Macho RMA (April 06, 2010 8:29 AM)  O2 Flow:  Room air CC: Follow-up visit/ CF Is Patient Diabetic? Yes   History of Present Illness: Patient is a 65 yo Caucasian female in today for follow up on abdominal pain and multiple other concerns. She reports an 80-90% improvement in her cough sincer her treatment last month, she is happy with this. She denies any fevers/chills/HA/CP/palp/SOB/GU c/o. She is still struggling with chronic abdominal pain and is still struggling with fecal incontinence when she coughs. She has had this evaluated in past and no structural cause was identified. No bloody or tarry stool. She does note it has improved somewhat on the Benefiber twice a day.  She did not tolerate the probiotic but did not try a dairy free one thus far.  she continues to work despite her chronic abdominal pain.  Had an iron infusion  a couple weeks but is struggling with fatigue. She reports her Hematologist, Dr Dalene Carrow notified her that ferritin is very low at 6. They will now monitor this closely for her.  Her previous colonoscopy was with Dr Vincent Peyer in September of 2011  Current Medications (verified): 1)  Bd Tb Syringe 27g X 1/2" 1 Ml  Misc (Tuberculin-Allergy Syringes) .... Use As Directed 2)  Cymbalta 60 Mg Cpep (Duloxetine Hcl) .... Take 1 Tablet By Mouth Once A Day 3)  Advair Diskus 250-50 Mcg/dose  Misc (Fluticasone-Salmeterol) .... Inhale 1 Puff Two Times A Day 4)  Singulair 10 Mg  Tabs (Montelukast Sodium) .... Take 1 Tablet By Mouth Once A Day 5)  Allergy Vaccine 1:10 G.o. (W-E) .... Once Weekly 6)  Protonix 40 Mg Tbec (Pantoprazole Sodium) .... Take  1 By Mouth Once Daily 7)  Trazodone Hcl 50 Mg Tabs (Trazodone Hcl) .... Take 1/2 By Mouth At Bedtime 8)  Calcitriol 0.5 Mcg Caps (Calcitriol) .... Take 1 By Mouth Once Daily 9)  Alprazolam 0.25 Mg Tabs (Alprazolam) .... Take 1 By Mouth Once Daily 10)  Cpap .... Set On 12 [facility] 11)  Patanase 0.6 % Soln (Olopatadine Hcl) .[name].. 1 To 2 Sprays Each Nostril Once Daily 12)  Xyzal 5 Mg Tabs (Levocetirizine Dihydrochloride) .[name].. 1 Daily Antinhistamine, As Needed 13)  Vitamin B12 .... Sub 14)  Zithromax 250 Mg Tabs (Azithromycin) .... 2 Tabs By Mouth Once and Then 1 Tab By Mouth Daily X 4 Days 15)  Mucinex 600 Mg Xr12h-Tab (Guaifenesin) .[name].. 1 Tab By Mouth Two Times A Day X 10 Day 16)  Benefiber  Powd (Wheat Dextrin) .... 2 Tsp By Mouth Two Times A Day in Fluids 17)  Toviaz 8 Mg Xr24h-Tab (Fesoterodine Fumarate) .... Once Daily 18)  Triamcinolone Acetonide 0.1 % Oint (Triamcinolone Acetonide) .... Apply Small Amount To Affected Three Times A Day  Allergies (verified): 1)  ! Pcn 2)  ! Codeine 3)  ! * Erthromyci N 4)  ! Demerol 5)  ! Keflex 6)  ! Levaquin 7)  ! Sulfa 8)  ! Percocet  Past History:  Past medical history reviewed for relevance to current acute and chronic problems. Social history (including risk factors) reviewed for relevance to current acute and chronic problems.  Past Medical History: Reviewed history from 11/19/2007 and no changes required. OBESITY (ICD-278.00) ESOPHAGEAL REFLUX (ICD-530.81) FIBROMYALGIA (ICD-729.1) ASTHMA (ICD-493.90) ALLERGIC RHINITIS (ICD-477.9) SLEEP APNEA, OBSTRUCTIVE (ICD-327.23)  Social History: Reviewed history from 03/07/2010 and no changes required. Married Patient never smoked. Parents smoked. Drug use-no Regular exercise-no Alcohol use-yes, special occasions Occupation: Lobbyist  Review of Systems      See HPI  Physical Exam  General:  Well-developed,well-nourished,in no acute distress; alert,appropriate and  cooperative throughout examination. Mildly uncomfortable  Head:  Normocephalic and atraumatic without obvious abnormalities. No apparent alopecia or balding. Mouth:  Oral mucosa and oropharynx without lesions or exudates.   Lungs:  Normal respiratory effort, chest expands symmetrically. Lungs are clear to auscultation, no crackles or wheezes. Heart:  Normal rate and regular rhythm. S1 and S2 normal without gallop, click, rub or other extra sounds.grade  1/6 systolic murmur.   Abdomen:  normal bowel sounds, no rigidity, no hepatomegaly, no splenomegaly, abdominal scar(s), and RUQ tenderness.   Extremities:  No clubbing, cyanosis, edema, or deformity noted with normal full range of motion of all joints.   Psych:  Cognition and judgment appear intact. Alert and cooperative with normal attention span and concentration. No apparent delusions, illusions, hallucination, slightly anxious.     Impression & Recommendations:  Problem # 1:  ABDOMINAL PAIN, GENERALIZED (ICD-789.07) Pain most intense in the right upper quadrant, she says most of the time the pain is worst inthe superficial wall from her ribs down and like a pulling sensation. She has trouble finding a comfortable position, sometimes the pain is sharper and deeper.. She is given an rx for Skelaxin to help with muscle spasm but her insurance will not cover it so it is changed to Baclofen 10mg  to try, also given an rx for Hyoscyamine to see if that helps with abdominal cramping. Continue with moist heat, Aspercreme and pain meds as needed   Problem # 2:  ENCOPRESIS (ICD-307.7) Come improvement with decreaed cough and addition of Benefiber, did not tolerated probiotics but has not tried the dairy free version. No bloody or tarry stool. Increase Benefiber to three times a day and try a vegan probiotic, continue to monitor  Problem # 3:  COUGH (ICD-786.2)  Orders: T-2 View CXR (71020TC) 80-90 % improved but has been present to some degree for  several months, will proceed with CXR to further evaluate. Patient to report worsening symptoms  Problem # 4:  ESSENTIAL HYPERTENSION, BENIGN (ICD-401.1) Well controllled at today's visit despite pain, no change in medications today  Problem # 5:  ANEMIA, IRON DEFICIENCY (ICD-280.9)  Her updated medication list for this problem includes:    Folic Acid 1 Mg Tabs (Folic acid) .[name]... 1 tab by mouth daily Now undergoing iron infusion and has been told that her Folic Acid is low. Will start rx and monitor  Complete Medication List: 1)  Bd Tb Syringe 27g X 1/2" 1 Ml Misc (Tuberculin-allergy syringes) .... Use as directed 2)  Cymbalta 60 Mg Cpep (Duloxetine hcl) .... Take 1 tablet by mouth once a day 3)  Advair Diskus 250-50 Mcg/dose Misc (Fluticasone-salmeterol) .... Inhale 1 puff two times a day 4)  Singulair 10 Mg Tabs (Montelukast sodium) .... Take 1 tablet by mouth once a day 5)  Allergy Vaccine 1:10 G.o. (w-e)  .... Once weekly 6)  Protonix  40 Mg Tbec (Pantoprazole sodium) .... Take 1 by mouth once daily 7)  Trazodone Hcl 50 Mg Tabs (Trazodone hcl) .... Take 1/2 by mouth at bedtime 8)  Calcitriol 0.5 Mcg Caps (Calcitriol) .... Take 1 by mouth once daily 9)  Alprazolam 0.25 Mg Tabs (Alprazolam) .... Take 1 by mouth once daily 10)  Cpap  .... Set on 12 [facility] 11)  Patanase 0.6 % Soln (Olopatadine hcl) .[name].. 1 to 2 sprays each nostril once daily 12)  Xyzal 5 Mg Tabs (Levocetirizine dihydrochloride) .[name].. 1 daily antinhistamine, as needed 13)  Vitamin B12  .... Sub 14)  Zithromax 250 Mg Tabs (Azithromycin) .... 2 tabs by mouth once and then 1 tab by mouth daily x 4 days 15)  Mucinex 600 Mg Xr12h-tab (Guaifenesin) .[name].. 1 tab by mouth two times a day x 10 day 16)  Benefiber Powd (Wheat dextrin) .... 2 tsp by mouth two times a day in fluids 17)  Toviaz 8 Mg Xr24h-tab (Fesoterodine fumarate) .... Once daily 18)  Triamcinolone Acetonide 0.1 % Oint (Triamcinolone acetonide) .... Apply small amount to  affected three times a day 19)  Levsin/sl 0.125 Mg Subl (Hyoscyamine sulfate) .[name].. 1 tab sl as needed abdominal pain q 4 hours as needed pain 20)  Folic Acid 1 Mg Tabs (Folic acid) .[name].. 1 tab by mouth daily 21)  Baclofen 10 Mg Tabs (Baclofen) .... Three times a day as needed pain/muscle relaxer. may cause sedation  Patient Instructions: 1)  Release of Records Dr Dalene Carrow, Vicente Serene, Heme onc, last 2 sets of labwork and last clinic note 2)  Try no dairy probiotic can get one at [facility] or [facility] 3)  Call if symptoms worsen 4)  Please schedule a follow-up appointment in 1 month.  5)  May increase Benefiber to three times a day  Prescriptions: SKELAXIN 800 MG TABS (METAXALONE) 1 tab by mouth three times a day as needed pain, muscle relaxer  #60 x 3   Entered and Authorized by:   Danise Edge MD   Signed by:   Danise Edge MD on 04/06/2010   Method used:   Electronically to        [facility]. [phone]* (retail)       [phone]       [place], [place]  [address]       Ph: [phone]       Fax: [phone]   RxID:   [id number] FOLIC ACID 1 MG TABS (FOLIC ACID) 1 tab by mouth daily  #30 x 3   Entered and Authorized by:   Danise Edge MD   Signed by:   Danise Edge MD on 04/06/2010   Method used:   Electronically to        [facility]. [phone]* (retail)       [phone] [address]       [place], [place]  [address]       Ph: [phone]       Fax: [phone]   RxID:   [id number] LEVSIN/SL 0.125 MG SUBL (HYOSCYAMINE SULFATE) 1 tab sl as needed abdominal pain q 4 hours as needed pain  #40 x 3   Entered and Authorized by:   Danise Edge MD   Signed by:   Danise Edge MD on 04/06/2010   Method used:   Electronically to        [facility]  [address] [phone]* (retail)       [phone]       [place], [place]  [address]       Ph: [phone]       Fax: [phone]   RxID:    [id number]    Orders Added: 1)  T-2 View CXR [71020TC] 2)  Est. Patient Level IV [64332]

## 2010-04-11 NOTE — Progress Notes (Signed)
  Phone Note From Pharmacy   Summary of Call: Pharmacy called stating Skelaxin is unavailable. Pharmacist would like to know if MD would like to change rx? Initial call taken by: Josph Macho RMA,  April 06, 2010 9:18 AM  Follow-up for Phone Call        Baclofen 10mg  by mouth three times a day as needed pain/muscle relaxer May cause sedation. #60 with 1 rf Follow-up by: Danise Edge MD,  April 06, 2010 9:24 AM    New/Updated Medications: BACLOFEN 10 MG TABS (BACLOFEN) three times a day as needed pain/muscle relaxer. May cause sedation Prescriptions: BACLOFEN 10 MG TABS (BACLOFEN) three times a day as needed pain/muscle relaxer. May cause sedation  #60 x 1   Entered by:   Josph Macho RMA   Authorized by:   Danise Edge MD   Signed by:   Josph Macho RMA on 04/06/2010   Method used:   Electronically to        [facility]. [phone]* (retail)       [phone]       [place], [place]  [address]       Ph: [phone]       Fax: [phone]   RxID:   [id number]

## 2010-04-14 ENCOUNTER — Other Ambulatory Visit: Payer: Self-pay | Admitting: Family Medicine

## 2010-04-14 ENCOUNTER — Telehealth: Payer: Self-pay | Admitting: Internal Medicine

## 2010-04-14 DIAGNOSIS — G4733 Obstructive sleep apnea (adult) (pediatric): Secondary | ICD-10-CM

## 2010-04-14 NOTE — Telephone Encounter (Signed)
Okay to send order for new CPAP machine through [facility] per CDY. Order has been placed and sent to [facility]. Pt is aware that order has been placed.

## 2010-04-18 ENCOUNTER — Ambulatory Visit
Admission: RE | Admit: 2010-04-18 | Discharge: 2010-04-18 | Disposition: A | Discharge status: Home / Self Care | Payer: BC Managed Care – PPO | Source: Ambulatory Visit | Attending: Family Medicine | Admitting: Family Medicine

## 2010-04-19 NOTE — Progress Notes (Signed)
Pt informed

## 2010-04-20 NOTE — Progress Notes (Signed)
Summary: Radiology Report  Phone Note Outgoing Call   Summary of Call: Left detailed message on pts home answering machine stating that radiology report stated no active disease no acute process per md. Initial call taken by: Josph Macho RMA,  April 10, 2010 9:28 AM

## 2010-04-21 ENCOUNTER — Encounter: Payer: BC Managed Care – PPO | Admitting: Hematology and Oncology

## 2010-05-03 ENCOUNTER — Telehealth: Payer: Self-pay | Admitting: Family Medicine

## 2010-05-03 NOTE — Telephone Encounter (Signed)
Faxed last CXR to Debbie at the [facility]. Fax: [phone]

## 2010-05-10 ENCOUNTER — Ambulatory Visit: Payer: BC Managed Care – PPO | Admitting: Family Medicine

## 2010-05-11 ENCOUNTER — Ambulatory Visit: Payer: BC Managed Care – PPO | Admitting: Family Medicine

## 2010-05-11 ENCOUNTER — Other Ambulatory Visit: Payer: Self-pay | Admitting: Hematology and Oncology

## 2010-05-11 ENCOUNTER — Encounter (HOSPITAL_BASED_OUTPATIENT_CLINIC_OR_DEPARTMENT_OTHER): Payer: BC Managed Care – PPO | Admitting: Hematology and Oncology

## 2010-05-11 DIAGNOSIS — D509 Iron deficiency anemia, unspecified: Secondary | ICD-10-CM

## 2010-05-11 LAB — CBC WITH DIFFERENTIAL/PLATELET
Eosinophils Absolute: 0.3 10*3/uL (ref 0.0–0.5)
MONO#: 0.5 10*3/uL (ref 0.1–0.9)
NEUT#: 4.2 10*3/uL (ref 1.5–6.5)
RBC: 4.2 10*6/uL (ref 3.70–5.45)
RDW: 24.6 % — ABNORMAL HIGH (ref 11.2–14.5)
WBC: 6.5 10*3/uL (ref 3.9–10.3)
lymph#: 1.5 10*3/uL (ref 0.9–3.3)

## 2010-05-11 LAB — COMPREHENSIVE METABOLIC PANEL
ALT: 82 U/L — ABNORMAL HIGH (ref 0–35)
Albumin: 3.7 g/dL (ref 3.5–5.2)
CO2: 31 mEq/L (ref 19–32)
Calcium: 9.1 mg/dL (ref 8.4–10.5)
Chloride: 103 mEq/L (ref 96–112)
Sodium: 142 mEq/L (ref 135–145)
Total Protein: 6.3 g/dL (ref 6.0–8.3)

## 2010-05-11 LAB — IRON AND TIBC: Iron: 49 ug/dL (ref 42–145)

## 2010-05-12 ENCOUNTER — Encounter: Payer: Self-pay | Admitting: Internal Medicine

## 2010-05-17 ENCOUNTER — Encounter (HOSPITAL_BASED_OUTPATIENT_CLINIC_OR_DEPARTMENT_OTHER): Payer: BC Managed Care – PPO | Admitting: Hematology and Oncology

## 2010-05-17 DIAGNOSIS — Z9884 Bariatric surgery status: Secondary | ICD-10-CM

## 2010-05-17 DIAGNOSIS — D509 Iron deficiency anemia, unspecified: Secondary | ICD-10-CM

## 2010-06-06 NOTE — Procedures (Signed)
NAMEEDLIN, FORD NO.:  [id number]   MEDICAL RECORD NO.:  [id number]          PATIENT TYPE:  REC   LOCATION:  [facility]                          FACILITY:  [facility]   PHYSICIAN:  Celene Kras, MD        DATE OF BIRTH:  05-10-1945   DATE OF PROCEDURE:  DATE OF DISCHARGE:                               OPERATIVE REPORT   1. [name] comes to [place] today and not quite at baseline, about a months      relief cycling, but starting to recrudescence.  It is reasonable to      reinforce, and I would consider going on to RF.  A strong      responder, she is feeling better than she has felt for some time.      We are going to go ahead with proximal ilioinguinal iliohypogastric      reinforcement, block at the pelvic brim, and consider pulsed RF at      some point.  2. Other modifiable features and health profile are discussed.  3. Weight control for best outcome.  4. Maintain contact with primary care.   Objectively, some peri-incisional discomfort, particularly with  compression to the right.  This is her typical pain with referred  inguinal discomfort.  Seemingly less discomfort noted.  Nothing new  neurologically.   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   PLAN:  Ilioinguinal iliohypogastric nerve block at the juncture of the  pelvic brim.  Right side under local anesthetic, and she is consented.  Predicate further intervention based on need and overall response.  We  may consider followup intervention if necessary in a month or so, but I  want to see how she does.  Will follow expectantly.   Objectively, typical pain with compression, right side but seemingly  less so than previous assessment.  Still quite a bit of peri-incisional  discomfort.   IMPRESSION:  Ilioinguinal iliohypogastric neuritis.   PLAN:  Injection.  Ilioinguinal iliohypogastric nerve, and she is  consented.   The patient taken to the fluoroscopy suite and placed in  supine  position.  The right pelvic brim is prepped and draped in the usual  fashion, and under direct fluoroscopic observation I advance a 22-gauge  needle to the appropriate landmarks and confirm placement in multiple  fluoroscopic positions.  I use Isovue 200.  We stayed lateral.  No  evidence of bowel intrusion.  Once we confirm placement, we then inject  4 mL of lidocaine 1% MPF and 40 mg of Aristocort.   Tolerated procedure well.  Near complete diminution of pain perception  at discharge.  Improved.  No complications identified.           ______________________________  Celene Kras, MD     HH/MEDQ  D:  01/07/2007 10:44:21  T:  01/07/2007 14:39:05  Job:  [id number]

## 2010-06-06 NOTE — H&P (Signed)
[name], [name]                ACCOUNT NO.:  [id number]   MEDICAL RECORD NO.:  [id number]          PATIENT TYPE:  EMS   LOCATION:  [facility]                         FACILITY:  [facility]   PHYSICIAN:  [name], M.D.DATE OF BIRTH:  12-21-1945   DATE OF ADMISSION:  07/17/2006  DATE OF DISCHARGE:                              HISTORY & PHYSICAL   PRIMARY CARE PHYSICIAN:  Dr. Ventura Wood   CHIEF COMPLAINT:  Lethargy.   HISTORY OF PRESENTING COMPLAINT:  [name] is a 65 year old Caucasian  female, who drove to work this morning, feeling extremely tired.  When  she got to work, she could not get out of the car.  She was feeling  sleepy while driving.  When she got to work, she could not get out of  the car.  Colleagues at work noticed that there was a problem and they  drove her to the hospital.  On arrival in the emergency room, the  patient was somnolent, very difficult to arouse.  She went for a head CT  scan, which was negative for any acute abnormality.  She subsequently  improved and she is very alert and oriented to time, place and person.   The patient states that, since she has been using Lyrica, since two  weeks ago, she has been feeling extremely sleepy.  She was initially  told to use one tablet of 75 mg twice a day.  She has cut it back to 75  mg at bedtime.  Despite this, she feels tired on waking up, but nothing  like today's experience.  Additionally, the patient is using some other  sedating medications at night, which include Xyzal 0.5 mg, trazodone 50  mg, clonazepam 0.5 mg and Xanax p.r.n.   She has headaches, which started after she had myelogram recently.  She  has L4-L5 stenosis and she has been planned for surgery by Dr. Shelle Wood.  Additionally, the patient has had gastric bypass surgery in the past, re-  do.  She had panniculectomy and subsequently developed anterior  abdominal wall hernia.  She is scheduled for reconstruction surgery soon  in  [place].   REVIEW OF SYSTEMS:  She denies fever, chills.  Headache is there, but  getting better.  No chest pain, cough or shortness of breath.  No  diarrhea, vomiting, or abdominal pain.   PAST MEDICAL HISTORY:  1. Obesity.  2. Allergic rhinitis.  3. Asthma.  4. Depression.  5. Fibromyalgia.  6. Gastroesophageal reflux disease.  7. Obstructive sleep apnea.  8. Anterior abdominal wall panniculitis.   PAST SURGICAL HISTORY:  1. Gastric bypass surgery four years ago.  This was subsequently      redone with decreasing the bypass.  2. Panniculectomy.  3. Cholecystectomy many years ago.  4. Appendectomy at the age of 45.  5. Two cervical disc surgeries.   CURRENT MEDICATIONS INCLUDE:  1. Advair 250/50 one b.i.d.  2. Singulair 10 mg daily.  3. Atrovent p.r.n.  4. Lamictal 100 mg b.i.d. for depression.  5. Lexapro 20 mg daily.  6. Enablex 50 mg daily.  7.  Lyrica 75 mg b.i.d.  8. Xyzal 0.5 mg b.i.d.  9. Flonase 50 micrograms q.h.s.  10.Trazodone 50 mg q.h.s.  11.Clonazepam 0.5 mg q.h.s.  12.Xanax 0.25 mg p.r.n.  13.Astelin nasal spray.  14.Topamax p.r.n.  15.Darvocet p.r.n.   ALLERGIES:  CODEINE causes hallucination, DEMEROL causes hallucination,  ERYTHROMYCIN, PENICILLIN and KEFLEX cause nausea, vomiting and itching.   FAMILY HISTORY:  Both parents are deceased.  They were both alcoholics.  Mother passed away from complications of alcoholic cirrhosis.  Father  had stroke.  He passed away from head injury.   SOCIAL HISTORY:  She is a Production designer, theatre/television/film at a [employer].  She  does not smoke cigarettes or drink alcohol.  Independent of activities  of daily living, except for recent limitation from back pain and  radiculopathy.   PHYSICAL EXAM:  INITIAL VITALS ON ARRIVAL:  Temperature 97.1, blood  pressure 151/77, pulse of 56, respiratory rate of 20, O2 sats of 100% on  room air.  EXAMINATION:  The patient is alert, oriented in time, place and person.   Normocephalic, atraumatic head.  Pupils equal, round and reactive to  light.  Extraocular muscle movement intact.  No elevated JVD.  No  carotid bruit.  LUNGS:  Clear clinically to auscultation.  CVS:  S1, S2 regular.  ABDOMEN:  Obese, soft, ventral hernias, bowel sounds present, no  palpable organomegaly.  EXTREMITIES:  Varicose veins, more on the right than the left.  Dorsalis  pedis pulses palpable bilaterally.  CNS:  She has weakness of plantar and dorsiflexion.  Motor power in both  upper extremities is normal.  Deep tendon reflexes are symmetrical.  Plantar reflex is down-going.  SKIN:  No rash or petechiae.   INITIAL LABORATORY DATA:  White cell count 5, hemoglobin 13.5,  hematocrit 40.6, platelets 204, normal differentials.  Cardiac markers  at the point of care are normal.  Sodium 141, potassium 3.9, chloride  106, bicarb 26, glucose 94, creatinine 0.72, bilirubin 0.6, alkaline  phosphatase 207, AST 632, ALT 321, total protein 6.6, albumin 3.9,  calcium 8.6.  Alcohol level less than 5.  Salicylate level less than 4.  Urinalysis unremarkable.  Specific gravity 1.008.  Urine drug screen was  unremarkable.  Tricyclic antidepressant not detected.   Chest x-ray:  Cardiac enlargement without any acute disease.  Head CT  scan was normal.   EKG showed right bundle branch block, sinus bradycardia with a heart  rate of 55 beats per minute.  QTC interval was 469.  Left ventricular  hypertrophy.   ASSESSMENT AND PLAN:  [name] is a 65 year old Caucasian female, who  is presenting with extreme somnolence, which has been happening since  she started using Lyrica.  It got worse today.  She is currently alert  and oriented in time, place and person.  All lab work normal, except for  abnormal transaminases and alkaline phosphatase.  She has normal  bilirubin.   ADMISSION DIAGNOSIS:  1. Somnolence, likely secondary to new medications on the background      of sleep apnea and other  concurrent sedating medications, including      trazodone, clonazepam, Xyzal and Xanax.  She has been using Lyrica      for neuropathic pain.  We will substitute this with Darvocet and      observe.  We will check TSH.  Her sleep apnea seems to be under      control.  We will resume C-PAP while in the hospital, get PT/OT  to      evaluate.  2. Abnormal LFTs.  I do not have record of recent lab tests.  We will      obtain these from Dr. Lucie Leather office.  Patient is status post      cholecystectomy.  I suspect this is due to hepatotoxic medications,      like trazodone, Klonopin and Lamictal, which she has been using for      a long time.  Nevertheless, we will obtain ultrasound of the liver      to rule out cirrhosis.  We will obtain hepatitis profile.  We may      well need to taper down these psychiatric medications if no other      etiology can be ascribed to the abnormal LFTs.  She will need      substitutes.  We will ask psychiatry for help.  3. Left lower extremity weakness, secondary to L4-L5 lumbar stenosis.      Patient is planned for surgery soon.  We will continue PT/OT while      in house.  4. Anterior abdominal wall hernias.  She is planned for surgery in      [place].  5. Obstructive sleep apnea.  Continue with C-PAP at night.  6. Depression.  7. Obesity.      Gail B. Corky Downs, M.D.  Electronically Signed     MBB/MEDQ  D:  07/17/2006  T:  07/17/2006  Job:  [id number]   cc:   Evelena Peat, M.D.

## 2010-06-06 NOTE — Assessment & Plan Note (Signed)
Pounds returns today.  I last saw her on October 15, 2007.  She has a  history of ilioinguinal neuropathy, postoperative complications  following panniculectomy in the interval time.  She has undergone lumbar  fusion procedure per Dr. Jordan Likes.  She has done quite well with this.  She  has some pain in the abdominal area related to scarring while wearing  her lumbosacral orthosis.  She has had no new medical complications  other than above.  Pain level, 7/10 abdominal area, walking tolerance 15  minutes.  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  She  has numbness, tingling in her abdominal area, depression.   PHYSICAL EXAMINATION:  VITAL SIGNS:  Blood pressure 140/85, pulse 70,  respirations 16, and O2 sat 96% on room air.  GENERAL:  No acute stress.  Orientation x3.  Gait is normal.  Coordination is normal.  ABDOMEN:  Some tenderness over the scarred areas along the incision  right lower quadrant and left lower quadrant.  No abdominal rigidity.  MUSCULOSKELETAL:  Lumbar spine range of motion is good.   IMPRESSION:  Ilioinguinal neuritis.  Stable in terms of this having some  local irritation due to brace as I have discussed with the patient  should this become unbearable, we can repeat an ilioinguinal injection.  She currently states that it is not particularly problematic.  She has  been using some intermittent Darvocet, stated that we can write this for  her, but would need to follow up with her from time to time.  She states  that she will try to get this through her primary care office.      Erick Colace, M.D.  Electronically Signed     AEK/MedQ  D:  02/17/2008 16:23:52  T:  02/18/2008 05:58:33  Job #:  [id number]   cc:   Ocie Doyne, M.D.  Fax: [phone]

## 2010-06-06 NOTE — Op Note (Signed)
NAMEBILLIEJEAN, [name]                ACCOUNT NO.:  [id number]   MEDICAL RECORD NO.:  [id number]          PATIENT TYPE:  OIB   LOCATION:  1532                         FACILITY:  [facility]   PHYSICIAN:  Jene Every, M.D.    DATE OF BIRTH:  [birth year]   DATE OF PROCEDURE:  10/31/2006  DATE OF DISCHARGE:                               OPERATIVE REPORT   PREOPERATIVE DIAGNOSIS:  Spinal stenosis L5-S1, left.   POSTOPERATIVE DIAGNOSIS:  Spinal stenosis L5-S1, left.   PROCEDURE PERFORMED:  Lateral recess decompression foraminotomies L5 and  S1.   ANESTHESIA:  General.   SURGEON:  Jene Every, M.D.   ASSISTANT:  Roma Schanz, P.A.-C.   BRIEF HISTORY:  65 year old with S1 radiculopathy secondary to spinal  stenosis at L5-S1, degenerative listhesis seen on myelogram only  refractory to conservative treatment.  Selective nerve root block  temporally effective.  He is here for decompression of the L5 and S1  nerve roots.  The risks and benefits were discussed including bleeding,  infection, damage to neurovascular structures, CSF leakage, epidural  fibrosis, adjacent segment disease, need for fusion in the future,  anesthetic complications, etc.   SURGICAL TECHNIQUE:  With the patient in supine position after the  induction of adequate anesthesia and 1 gram of vancomycin, she was  placed prone on the [place] frame.  All bony prominences were well  padded.  The lumbar region is prepped and draped in the usual sterile  fashion.  An 18 gauge spinal needle was utilized to localize the L5-S1  interspace and confirmed with x-ray.  An incision was made from the  spinous process of L5 to S1.  The subcutaneous tissue was dissected.  Electrocautery was utilized to achieve hemostasis.  The dorsal lumbar  fascia was identified and divided in line with the skin incision.  The  paraspinous muscle was elevated from the lamina of L5-S1.  The operating  microscope was draped and brought onto the  surgical field. Confirmatory  radiograph obtained with Penfield 4 in the interlaminar space.   Hemilaminotomy at the caudad edge of S1 was performed with the 2 mm  Kerrison detaching the ligamentum flavum. Foraminotomy of S1 was  performed.  Hypertrophic ligamentum flavum was noted compressing the S1  nerve root in the lateral recess.  Hemilaminotomy at the caudad edge of  the L5 was performed with the 2 mm Kerrison detaching the ligamentum  flavum.  Facet hypertrophy was noted, as well. Decompressed the facet to  the medial border of the pedicle utilizing 2 mm Kerrison.  This  compressed the S1 nerve root again in the lateral recess.  I removed  ligamentum flavum from the interspace as well as partial medial  facetectomy, foraminotomy of S1 and L5. Following this, there was  excellent decompression of the L5 and the S1 nerve roots.  The disc was  not herniated. No evidence of CSF leakage or active bleeding.  A hockey  stick probe was passed freely in the foramen of L5 and S1.  The wound  was copiously irrigated with antibiotic irrigation.  No evidence of  active  bleeding or CSF leakage.  Thrombin soaked Gelfoam was placed in  the laminotomy defect.  The McCullough retractor was removed.  The  paraspinous muscle was inspected with no evidence of active bleeding.  The dorsal lumbar fascia was were irrigated and closed with #1 Vicryl  figure-of-eight sutures.  The subcutaneous tissues were reapproximated  with 2-0 Vicryl and the skin was reapproximated with 4-0 subcuticular  Prolene.  The wound was reinforced with Steri-Strips.  A  sterile dressing was applied.  She was placed supine on the hospital  bed, extubated without difficulty, and transported to the recovery room  in satisfactory condition.  The patient tolerated the procedure well  with no complications.      Jene Every, M.D.  Electronically Signed     JB/MEDQ  D:  10/31/2006  T:  10/31/2006  Job:  [id number]

## 2010-06-06 NOTE — Assessment & Plan Note (Signed)
A 65 year old female with history of recurrent abdominal wall hernia,  several open abdominal procedures, panniculectomy, and  chronic  postoperative abdominal pain.  She has had ilioinguinal/iliohypogastric  neuritis with improvements after injections in the past per Dr. Stevphen Rochester.   She was started on tramadol last visit 50 mg b.i.d.  She actually takes  it more like once a day.  She is also switched by her primary care  physician from the Lexapro to Cymbalta.  She has had good relief of her  pain overall and is questioning whether or not she really needs the  nerve block again.   Average pain about 2/10, improves with rest and medications, worse with  sitting and bending.  Relief from meds is 5/10.  She can walk 10 minutes  at a time, climb steps, she drives.  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  She is active working out at [facility] [employer].   REVIEW OF SYSTEMS:  Problems with walking as well as depression.   PHYSICAL EXAMINATION:  VITALS:  Blood pressure 151/71, pulse 54,  respirations 20, O2 sat 95% on room air.  GENERAL:  No acute distress.  Orientation x3.  Obese female.  Gait is  normal.  She has pain when she stretches backwards from a standing  position.  ABDOMEN:  Positive bowel sounds.  Positive tympany, nontender to  palpation.  No hernias seen.   IMPRESSION:  Ilioinguinal/iliohypogastric neuritis recurrent, but  improved after starting tramadol.   PLAN:  We will continue current medications.  I will see her back in a  couple of months and if, in the meantime, tramadol is no longer  controlling her pain, I would schedule for repeat ilioinguinal block.      Erick Colace, M.D.  Electronically Signed     AEK/MedQ  D:  08/21/2007 13:20:33  T:  08/22/2007 08:52:20  Job #:  [id number]   cc:   Marjory Lies, M.D.  Fax: [phone]

## 2010-06-06 NOTE — Assessment & Plan Note (Signed)
PHYSICAL MEDICINE REHABILITATION OFFICE VISIT   A 65 year old female with recurrent abdominal wall hernias, several  abdominal procedures resulting from appendectomy, cholecystectomy.  She  has had history of an open small intestinal resection, panniculectomy,  and repair of 2 abdominal hernias with mesh replacement in the past.  She has had an infection along the incision line are panniculectomy in  2006, ventral hernia repair, August 22, 2006, panniculectomy on that same  date.  She has been treated.   She has complaints of abdominal pain in the groin region, as well as in  the periumbilical region.  She has had ilioinguinal nerve blocks under  fluoroscopic guidance x2, 12/10/2006, 01/07/2007.  She has had good  results in terms of her lower abdominal pain, continues to have more  upper abdominal periumbilical pain.  In addition, she notes paresthesias  and pain, right upper extremity, has had some relief with carpal tunnel  injection.   Pain level is 5 to 6/10, described as stinging and aching, interfering  with activity at a moderate level.  Sleep is fair.  She can walk 10  minutes at a time.  She climbs steps with difficulty.  She continues to  drive and she works in Therapist, sports.   GENERAL:  No acute distress.  Obese female.  ABDOMEN:  Positive bowel sounds.  She has tenderness with light  palpation over areas of abdominal scarring.  Gait is normal.  Alert and oriented x3.  LOWER EXTREMITIES:  Without edema.   IMPRESSION:  Chronic abdominal pain, combination ilioinguinal, as well  as myofascial pain in the rectus abdominis muscles.   PLAN:  1. Continue Lidoderm patch.  2. No need for repeat ilioinguinal injection.   I will see her back in 2 months.      Erick Colace, M.D.  Electronically Signed     AEK/MedQ  D:  04/10/2007 09:59:04  T:  04/10/2007 11:18:43  Job #:  [id number]   cc:   Madelynn Done, MD  Fax: [phone]   Dr. Corwin Levins,  M.D.  Fax: [phone]   Marjory Lies, M.D.  Fax: [phone]

## 2010-06-06 NOTE — Assessment & Plan Note (Signed)
[name] returns today.  Has had some improvement in her back  pain after what sounds like radiofrequency procedure by Dr. Ethelene Hal.  Her  abdominal pain mainly has episodes of sharp pain right over one of her  abdominal incisions on the right lower quadrant only when she bends  forward.  She has had no persistent abdominal pain.  She has had  ilioinguinal nerve blocks in the past, but has not had one recently.   Her functional status is overall good.  She is working out at [employer].  She is working 40 hours per week.   She has had no other new medical problems in the interval time.   PHYSICAL EXAMINATION:  VITAL SIGNS:  Her blood pressure 154/84, pulse  66, respiratory rate is 18, and O2 sat 95% on room air.  GENERAL:  No acute distress.  Mood and affect appropriate.  Orientation  x3.  Gait is normal.  ABDOMEN:  Has some mild tenderness to palpation right over the incision  over a scarred area.  I questioned whether this may be a neuroma.  EXTREMITIES:  Her lower extremity strength is normal.   IMPRESSION:  Ilioinguinal neuritis and chronic abdominal pain.   PLAN:  Use Lidoderm  to inguinal area, and I will see her back in 3  months.  Gradually extend time period should she be continued to do  well.      Erick Colace, M.D.  Electronically Signed     AEK/MedQ  D:  10/17/2007 14:45:58  T:  10/18/2007 02:36:27  Job #:  [id number]   cc:   [name]. Ethelene Hal, M.D.  Fax: [phone]

## 2010-06-06 NOTE — Letter (Signed)
June 27, 2006    Dr. Tawanna Cooler Heniford  [facility], [address] [phone]  [address]  [place], [place]. [address]   Attn:  Madaline Guthrie   RE:  [name], [name]  MRN:  [id number]  /  DOB:  [birth year]   Dear Dr. Marylene Land,   [name] has been under my care for obstructive sleep apnea,  allergic rhinitis and asthma with additional medical history of  fibromyalgia, esophageal reflux and obesity with bariatric surgery. She  indicates that you need a letter anticipating surgical repair of an  abdominal hernia. I most recently saw her on May 23, 2006 at which time  she weighed 194 pounds with blood pressure 122/62, normal pulse and a  room air saturation of 97%. Lungs were clear and heart sounds normal.  Room air saturation at rest was 97%. Our most recent chest x-ray report  on file here was from June 26, 2005 when she made a shallow inspiratory  effort consistent with her body habitus. There was atelectasis or  scarring in the left base. Her obstructive sleep apnea was diagnosed in  March of 2001 at which time she weighed 270 pounds. Since then she has  used CPAP reliably with her current pressure at 13 CWP. Our most recent  pulmonary function test was from May 05, 2002 when she demonstrated  mild restriction with an FEC of 2300 mL (79%). She had a normal FEV1 of  2100 (88%) with little response to bronchodilator. She was having only  some seasonal allergic rhinitis symptoms this spring with no additional  risk factors that would complicate planned surgery. Our current  medication list included:   CURRENT MEDICATIONS:  1. Prevacid 30 mg b.i.d.  2. Allergy vaccine injections once a week.  3. Singulair 10 mg daily.  4. Lamictal 100 mg b.i.d.  5. Lexapro 5 mg daily.  6. Flonase 2 puffs each nostril daily.  7. Advair 250/50 1 puff b.i.d.  8. Zyrtec D 12 hour b.i.d. p.r.n.  9. Enablex 15 mg.  10.Trazodone 100 mg.  11.Ipratropium 0.06% nasal spray once or twice each nostril t.i.d.  p.r.n. for rhinorrhea.  12.Clonazepam 0.5 mg at h.s. p.r.n. for sleep.  13.She had used Xyzal 5 mg daily p.r.n. for allergic rhinitis.  14.She has had prednisone in the fairly remote past for exacerbations      of asthma.   DRUG INTOLERANCE:  PENICILLIN, CODEINE, ERYTHROMYCIN and DEMEROL.   I hope this information is helpful to you in her care.    Sincerely,      Clinton D. Maple Hudson, MD, Tonny Bollman, FACP  Electronically Signed    CDY/MedQ  DD: 06/27/2006  DT: 06/28/2006  Job #: [id number]

## 2010-06-06 NOTE — Discharge Summary (Signed)
NAMEHALEEMA, VANDERHEYDEN                ACCOUNT NO.:  [id number]   MEDICAL RECORD NO.:  [id number]          PATIENT TYPE:  INP   LOCATION:  4731                         FACILITY:  [facility]   PHYSICIAN:  Elliot Cousin, M.D.    DATE OF BIRTH:  25-Mar-1945   DATE OF ADMISSION:  07/17/2006  DATE OF DISCHARGE:  07/20/2006                               DISCHARGE SUMMARY   DISCHARGE DIAGNOSES:  1. Altered mental status, lethargy, somnolence.  Etiology thought to      be secondary to a combination of Lyrica and Xyzal.  2. Transaminitis/hepatopathy, etiology unclear; rule out common bile      duct stone.  3. Chronic pain secondary to lumbar spinal stenosis and fibromyalgia.   For secondary discharge diagnoses and past medical history, please see  the dictated history and physical.   DISCHARGE MEDICATIONS:  1. Discontinue Lyrica  and Xyzal.  2. Advair 250/50 mg, one inhalation b.i.d.  3. Singulair 10 mg daily.  4. Atrovent 2 puffs every 4 hours as needed.  5. Lamictal 100 mg b.i.d.  6. Lexapro 20 mg daily.  7. Enablex 15 mg daily.  8. Flonase 50 mcg q.h.s.  9. Trazodone 50 mg to 100 mg q.h.s. p.r.n.  10.Clonazepam 0.5 mg q.h.s.  11.Xanax 0.25 mg p.r.n.  12.Darvocet-N 100, one tablet b.i.d. p.r.n.  13.Ibuprofen 400 mg q.4 h p.r.n.  14.Allergy injection as needed.  15.Zyrtec 5 mg daily as needed.  16.Astelin nasal spray 1 spray in each nostril as needed.   DISCHARGE DISPOSITION:  The patient was discharged to home in improved  and stable condition on July 20, 2006.  She was advised to follow up  with her primary care physician, Dr. Marjory Lies, in one week.   CONSULTATIONS:  Bernette Redbird, M.D.   PROCEDURES PERFORMED:  1. CT scan of the head without contrast on July 17, 2006.  The results      revealed no acute abnormalities.  2. Ultrasound of the abdomen on July 18, 2006.  The results revealed      no acute abnormality, status post cholecystectomy, common bile duct      is 5.9-mm.   HISTORY OF PRESENT ILLNESS:  The patient is a 65 year old woman with a  past medical history significant for lumbar spinal stenosis, allergic  rhinitis, fibromyalgia, and obstructive sleep apnea.  She presented to  the emergency department on July 17, 2006, after her colleagues at work  noticed that she was somewhat somnolent, lethargic, and was not acting  quite herself.  When she arrived to the emergency department, a CT scan  of the head was ordered.  The results were negative for any acute  abnormalities.  Upon further questioning, the patient was recently  started on the Lyrica for chronic back pain and Xyzal for allergic  rhinitis.  The patient also takes a number of other sedating medications  including trazodone, clonazepam, and Xanax.  She was admitted for  further evaluation and management.  For additional details please see  the dictated history and physical.   HOSPITAL COURSE:  1. LETHARGY/ALTERED MENTAL STATUS/SOMNOLENCE.  On  admission, the      patient was somewhat lethargic but she was alert enough to follow      commands.  She was afebrile and her white blood cell count was      within normal limits.  As indicated above, a CT scan of the head      was ordered to rule out any acute intracranial abnormalities.  The      CT of the head was negative.  Lyrica was discontinued.  Over the      course of the hospitalization, the patient became much more alert      and oriented.  The somnolence, lethargy, and altered mental status      completely resolved.  The patient was advised to discontinue the      Xyzal and Lyrica for now.  For pain relief, Vicodin and Percocet      were offered, however, the patient has an intolerance to both.  She      was therefore advised to take Darvocet modestly and ibuprofen as      needed for pain.  She was also advised to take Zyrtec instead of      Xyzal as needed for allergic rhinitis.  2. TRANSAMINITIS/HEPATOPATHY.  On admission, the patient's  lab data      were remarkable for an alkaline phosphatase of 207, SGOT of 632,      and SGPT of 321.  For further evaluation, an alcohol level,      acetaminophen level, urine drug screen, and ultrasound of the      abdomen were ordered.  The patient's alcohol level was less than 5,      acetaminophen level was less than 5, and the urine drug screen was      essentially negative.  The ultrasound of the abdomen revealed no      acute abnormalities and status post cholecystectomy.  Dr. Matthias Hughs,      gastroenterologist, was consulted.  His differential diagnoses for      the elevation and the patient's LFTs included a possible common      bile duct stone, toxic exposure, and reactive hepatopathy.  He      recommended an outpatient open MRCP for further evaluation in the      outpatient setting.  He requested that the patient's primary care      physician arrange the outpatient MRCP.  Over the course of the hospitalization, the patient's liver  transaminases improved significantly.  Prior to hospital discharge, her  alkaline phosphatase improved to 131, the SGOT improved to 34, and the  SGPT improved to 93.  A viral hepatitis panel was ordered and the final  results are pending.  However, the hepatitis B surface antigen was  resulted and it was negative.  1. CHRONIC BACK PAIN WITH A HISTORY OF L4-L5 SPINAL STENOSIS.  The      patient is followed by orthopedic surgeon, Dr. Shelle Iron.  As indicated      above, the Lyrica that was recently prescribed was discontinued      during the hospital course.  The patient was advised to take      Darvocet and ibuprofen as needed for pain.  The patient has an      appointment with orthopedic physician, Dr. Ethelene Hal, who plans to give      her a depo injection next week.  Hopefully, this will relieve some      of her discomfort.   DISCHARGE LABORATORY  DATA:  Ferritin 73.  TSH 0.882 (within normal  limits).  PT 13.1, INR 1.  Sodium 147, potassium 4, chloride 108,  CO2  29, glucose 92, BUN 11, creatinine 0.77.  Total bilirubin 0.3, alkaline  phosphatase 131, SGOT 34, SGPT 93, total protein 5.7, albumin 3.4,  calcium 8.8. Viral hepatits panel pending.      Elliot Cousin, M.D.  Electronically Signed     DF/MEDQ  D:  07/20/2006  T:  07/20/2006  Job:  [id number]   cc:   Marjory Lies, M.D.  Burnard Bunting, M.D.

## 2010-06-06 NOTE — Procedures (Signed)
[name], [name] NO.:  [id number]   MEDICAL RECORD NO.:  [id number]          PATIENT TYPE:  REC   LOCATION:  TPC                          FACILITY:  [facility]   PHYSICIAN:  Celene Kras, MD        DATE OF BIRTH:  1945/05/23   DATE OF PROCEDURE:  12/10/2006  DATE OF DISCHARGE:                               OPERATIVE REPORT   [name].  I have reviewed the  history form, 14 point review of systems.  I have reviewed the chart,  progress to date, overall directed care approach.  I discussed treatment  limitations and options with her.  The primary complaint is the inguinal  pain to the right, followed by incisional pain throughout her abdomen.  She has had a panniculectomy, problems since this time with left side  unremarkable, right side most problematic. Peri-incisional pain is not  described as pseudomotor or elements of neuropathic or nerve type pain,  more of a deep aching pain that frequently tugs and pulls on her  abdominal wall.  She has been evaluated by the surgeon and he states no  further surgery, she wants to avoid that if possible. The inguinal pain,  she states, sometimes brings her to her knees where she states  paroxysmal nature, but constant background pain, and has had the  inguinal blocks in the past with very successful results, albeit they  were limited in relief cycling.   1. I have discussed options with her.  I used models and discussed in      lay terms, and I have examined her.  I think she has really two      problems. The abdominal issue we may have to address from another      approach, but she will probably benefit from an the iliohypogastric      block, under fluoroscopic observation, at the bifurcation.  This      will be at the iliac crest, and I have reviewed the risks,      complications and options.  This includes bleeding, infection,      nerve damage, stroke, seizure, death, other  unforeseen problems not      commonly encountered, possible abdominal escalating pain.  She      understands and wishes to proceed.  The rationale is positive      predictive experience may lead [place] to consider radiofrequency neural      ablation, pulse defect, or else possible phenol ablation.  We will      think more inside out than outside in.  As a mixed pattern, we may      inject peri-incisional as well, and consider modulation at a later      date.  She may actually respond to higher dorsal column      stimulation.   1. She does not like taking a lot of medications.  We are not going to      prescribe any today, but Ultram ER would probably be a good choice  for her.  She has a lot of allergies with more side effects, but      Ultram ER would be an appropriate agent for her, and she takes      Darvocet sparingly, because of some dysphoria.   Objectively, peri-incisional discomfort, this is throughout. She has an  essentially right to left incision, well healed, but pain at about 2  inches superior, inferior to the margin.  The inguinal region is  activated with compression to the peri-inguinal structures, which  increases her pain and mimics many of her symptoms.  I do not find  anything new neurologically.  She has good distal reflexes, intact here  throughout.   IMPRESSION:  Inguinal neuralgia.  The peri-incisional pain secondary to  panniculectomy.   PLAN:  1. Ilioinguinal iliohypogastric nerve block.  2. Injection peri-incisionally.  3. Follow up expectantly.  Consider ending Ultram ER.  4. She trialed Neurontin and Lyrica and she had a bump in enzymes, but      I do not think it was from the Lyrica, but probably from her other      agents she was taking.  I have discussed this in lay terms,      questions were answered and reviewed options with her.  She has      consented.   DESCRIPTION OF PROCEDURE:  The patient is taken to the fluoroscopy suite  and placed in  the supine position.  The inguinal and abdominal region  was prepped and draped.  Using a lateral approach with a 22 gauge spinal  needle, I advance to the brim of the ileum, and confirmed placement in  multiple fluoroscopic positions.  I used Isovue 200.  We then inject 3  mL of Marcaine 0.5% MPF and 20 mg Aristocort.   Care was then addressed to the peri-incisional region that would  probably be contributory to the inguinal experience.  We then inject 3  mL of Marcaine 0.5% MPF with 20 mg Aristocort.   In the recovery area, complete diminution in pain, perception, assessed  within context of activities of daily living, we will see her in follow  up, discharge instructions are given, questions were answered, no  barrier to communication.           ______________________________  Celene Kras, MD     HH/MEDQ  D:  12/10/2006 12:14:21  T:  12/10/2006 04:54:09  Job:  [id number]

## 2010-06-06 NOTE — Group Therapy Note (Signed)
Consult requested by [employer] in [place]   PRIMARY CARE PHYSICIAN:  [name], M.D.   Consult requested for right ilioinguinal pain.   HISTORY:  A 65 year old female has a history of recurrent abdominal wall  hernias, undergone several open abdominal procedures which resulted from  appendectomy as a teenager, cholecystectomy approximately 25 to 30 years  ago.  Open small intestinal resection, panniculectomy and repair of two  abdominal hernias with mesh placement.  She has necrosis infection along  the incision line after panniculectomy and her last procedure in terms  of hernia repair was June of 2006 prior to establishing care with Dr.  Marylene Land .  She underwent open ventral hernia repair August 22, 2006 per  Dr. Marylene Land  .  She had a panniculectomy the same day by Dr. Gwyndolyn Kaufman  from plastic surgery in [place].  She had no immediate post surgical  complications.  She developed ilioinguinal pain postoperatively, first  noted about two weeks postop.  Had local infiltration by surgery.  Her  past history is significant for hypertension, diabetes, cardiac murmur,  asthma, sleep apnea, uses a mask, degenerative disk disease,  fibromyalgia pain, panic attacks and anemia, cataracts.  Past surgical  history as above.  In addition she has had cervical surgery and lateral  recess decompression foraminotomy L5-S1 per Dr. Shelle Iron, October 31, 2006.  Her main pain is related to her abdomen.  She also has pain in her back  and left lateral thigh.  Her rates her average pain as 4/10, interferes  with activity at a moderate level and at a moderate severe level with  enjoyment of life.  The patient is worse in the evening, sleep is fair.  Pain is worse with standing and other activities.  Relief from meds is  fair.  She walks 10 to 15 minutes at a time.   REVIEW OF SYSTEMS:  Positive for bladder control problems, numbness,  tremor, depression.  The remainder of the 14 point review of  systems is  negative.   CURRENT MEDICATIONS:  Lamictal 100 mg twice daily, Lexapro 20 mg a day,  Advair 250 twice daily, clonazepam 0.5 at bedtime, trazodone 100 at  bedtime, Darvocet 1 to 2 as needed, Singulair 10 daily, Enablex 15  daily, Astelin 30 nasal spray, Claritin 10 mg a day, Advil p.r.n.,  allergy shots and Fosamax.   PHYSICAL EXAMINATION:  Female in no acute distress.  She has right to  left incision well-healed in abdomen.  Pain is about 2 inches superior  and inferior to the margins.  Inguinal region is activated with  compression to peri-inguinal structures increasing her pain, mimicking  her symptoms.  She has normal deep tendon reflexes, bilateral upper and  lower extremities, normal range of motion bilateral upper and lower  extremities.  She has some limitations in her low back range of motion.  Her gait shows no evidence of  instability.   IMPRESSION:  1. Ilioinguinal and iliohypogastric neuritis.  This is her primary      complaint.  2. Lumbar postlaminectomy syndrome with mild post operative pain.   PLAN:  1. Will try Lidoderm patch over the incisional area.  2. Send her for ilioinguinal hypogastric injections under fluoroscopic      guidance by Dr. Stevphen Rochester.  These will be done fluoroscopically along      the pelvic brim and proceed with radiofrequency if this is only      giving her partial or temporary response.   Thank  you very much for this interesting consultation.      Erick Colace, M.D.  Electronically Signed     AEK/MedQ  D:  02/13/2007 12:39:39  T:  02/13/2007 13:06:12  Job #:  [id number]   cc:   [name], M.D.  Fax: [phone]

## 2010-06-06 NOTE — Assessment & Plan Note (Signed)
CHIEF COMPLAINT:  Abdominal pain and hand numbness.  Pain is worse by  the end of the day.  She has Lidoderm not only on her abdomen, but also  on the base of her neck.   INTERVAL HISTORY:  Injection right wrist per Dr. Melvyn Novas.  Some relief  of right hand discomfort.  Average pain 5 to 6 out of 10.  Interferes  with activity on a moderate level.  She has constant aching pain as well  as an intermittent pinching pain in her abdomen.  The pain in the lower  abdomen has improved, mainly in the paraumbilical and upper areas of the  abdomen does it persist.  Pain is worse with walking, bending, and  sitting.  She does exercise in [place] doing abdominal strengthening as  well as other upper extremity exercises.  She does not do overhead  exercise due to neck pain.  She has numbness, tremor, tingling,  depression, abdominal pain, sleep apnea, and weight gain on her review  of systems.   PAST HISTORY:  Positive for hypertension, arthritis, diabetes.   SOCIAL HISTORY:  Married.   PHYSICAL EXAMINATION:  Blood pressure 139/52, pulse 66, respirations 18,  O2 sat 97% room air.  Neck has 75% range forward flexion, extension, lateral rotation and  bending.  She has negative reverse Phalen's.  Sensory has paresthesias  and dermatomal changes in the uppers.  She has 5/5 strength in deltoid,  biceps, triceps grip.  Her abdomen has palpable scar in the subcutaneous region with tenderness  to palpation, she has no rebound or guarding.  Her back has no tenderness to palpation currently.  She has pain with  extension in her abdominal area, it feels like it stretches.   PAST SURGICAL HISTORY:  C5-C6 to C6-C7 anterior cervical fusion by Dr.  Jordan Likes, February 01, 2003.   IMPRESSION:  1. Myofascial abdominal pain, status post multiple abdominal      surgeries.  She does have a stretching and pulling sensation and      reproduction of pain once she extends her abdomen.  I think some      myofascial release  as well as some mild abdominal stretching      exercises and strengthening exercises would be helpful.  Physical      therapy will refer her to our [facility] clinic for      that.  2. Upper extremity paresthesia.  3. History of diabetes.  4. History of disk herniation.  She is quite concerned about this,      does not feel like she knows what is going on.  Discussed further      electrodiagnostic testing which she would like to pursue.      Erick Colace, M.D.  Electronically Signed     AEK/MedQ  D:  04/10/2007 10:05:07  T:  04/10/2007 11:23:48  Job #:  [id number]

## 2010-06-06 NOTE — Assessment & Plan Note (Signed)
HISTORY:  A 65 year old female with history of recurrent abdominal wall  hernia, several open abdominal procedure, panniculectomy, and chronic  postoperative abdominal pain.  She has had ilioinguinal-iliohypogastric  neuritis, which initially had improved but seems to be recurring at this  time.  She has had some myofascial pain in the abdominal wall as well.  She in addition has had increasing problems in the low back and left  lower extremity and is planning to follow up with Dr. Ethelene Hal in regards  to this.  She has tried other medications in the past including  Neurontin for her neuritis-type pain which made her too drowsy.  She has  had prior ilioinguinal-iliohypogastric nerve block with Dr. Stevphen Rochester on  December 10, 2006.   She has had carpal tunnel release per Dr. Melvyn Novas on the right side.  She still has some tenderness and hypersensitivity over her incisional  area.  She is a bit discouraged and overall feels like everything is  hurting.   Rates her current pain level as 7/10 in the right lower abdomen, low  back, and left leg.  Interfere with activity at a 7-9/10 level, pain is  worse during the morning, daytime hours, improves with rest of  medications, worsens with sitting.  She had walked in 15 minutes at a  time.  This was painful.  She was able to climb steps and drive.  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   REVIEW OF SYSTEMS:  Independent with the exception of some household  duties.  Review of systems, some bladder control problems which are  chronic tingling, trouble walking, and depression.   MEDICATIONS:  1. Lamictal 50 mg one-half daily.  2. Darvocet nightly.  3. Klonopin nightly.  4. Trazodone 75 mg nightly.   PHYSICAL EXAMINATION:  VITAL SIGNS:  Her blood pressure is 159/88, pulse  is 55, respiration 18, and O2 saturation 97% on room air.  GENERAL:  Obese female in no acute distress.  Orientation x 3.  Affect  is alert.  Gait is normal.  ABDOMEN:  No  masses palpable.  No tenderness to palpation in the right  lower quadrant or in the right upper quadrant.  No tenderness in the  left side either.  She is able to do single leg raises bilaterally.  No  hypersensitivity over her lower abdominal scar.  EXTREMITIES:  Motor strength is full in bilateral hip flexion, knee  extension, and knee dorsiflexion, normal deep tendon reflexes.  Normal  lower extremity range of motion.   IMPRESSION:  1. Iliohypogastric-ilioinguinal neuritis, recurrent.  We will send her      back to Dr. Stevphen Rochester for ilioinguinal nerve blocks.  2. Carpal tunnel syndrome status post recent release.  The patient      will follow up with Dr. Melvyn Novas.  3. Chronic low back pain with left lower extremity pain.  The patient      will follow up with Dr. Ethelene Hal.   In terms of the medication, we started her on tramadol 50 mg b.i.d.  cautioning her of serotonin syndrome given that she is on Lexapro.      Erick Colace, M.D.  Electronically Signed     AEK/MedQ  D:  07/29/2007 16:02:00  T:  07/30/2007 08:14:59  Job #:  [id number]   cc:   Caralyn Guile. Ethelene Hal, M.D.  Fax: [phone]   Madelynn Done, MD  Fax: [phone]   Marjory Lies, M.D.  Fax: [phone]

## 2010-06-06 NOTE — Op Note (Signed)
NAMEALBIRDA, [name] NO.:  [id number]   MEDICAL RECORD NO.:  [id number]          PATIENT TYPE:  INP   LOCATION:  3009                         FACILITY:  [facility]   PHYSICIAN:  Kathaleen Maser. Pool, M.D.    DATE OF BIRTH:  05/15/1945   DATE OF PROCEDURE:  12/22/2007  DATE OF DISCHARGE:                               OPERATIVE REPORT   PREOPERATIVE DIAGNOSIS:  L5-S1 unstable postlaminectomy  spondylolisthesis with stenosis.   POSTOPERATIVE DIAGNOSIS:  L5-S1 unstable postlaminectomy  spondylolisthesis with stenosis.   PROCEDURE NAME:  Bilateral re-exploration of L5-S1 laminotomies with  bilateral L5-S1 decompressive laminectomies and decompressive  foraminotomies, more than would be require for simple interbody fusion  alone.  L5-S1 posterior lumbar revision utilizing tangent interbody  allograft wedge, Telamon interbody PEEK cage and local autografting.  L5-  S1 posterolateral arthrodesis utilizing nonsegmental pedicle screw  fixation and local autografting.   SURGEON:  Kathaleen Maser. Pool, MD   ASSISTANT:  Donalee Citrin, MD   ANESTHESIA:  General endotracheal.   INDICATIONS:  [name] is a 65 year old female, history of previous  bilateral L5-S1 decompressive laminotomies bilaterally.  The patient has  history of progressive back pain and lower extremity symptoms.  Workup  demonstrates evidence of an unstable postlaminectomy spondylolisthesis  of L5-S1 with resultant stenosis.  The patient has been counseled as to  her options.  She has decided to proceed with an L5-S1 decompression and  fusion instrumentation with hopes to improvement in her symptoms.   OPERATIVE NOTE:  The patient was brought to the operating room, placed  on operating table in a supine position.  After an adequate level of  anesthesia was achieved, the patient was placed prone on to Wilson frame  and appropriately padded.  The patient's lumbar region was prepped and  draped sterilely.  A 10 blade was  used to make a curvilinear skin  incision extending over the L5-S1 level.  This was carried down sharply  in the midline.  Subperiosteal dissection was then performed exposing  the lamina and facet joints of L4, L5, and S1 as well as the transverse  processes of L5 and sacral ala bilaterally.  Deep self-retaining  retractor was placed.  Intraoperative fluoroscopy was used and levels  were confirmed.  Previous laminotomy on each sides were dissected free  bilaterally using dental instruments.  Complete decompressive  laminectomy which was then performed at L5 and S1 using Leksell rongeur,  Kerrison rongeur, and high-speed drill to remove the remaining lamina of  the L5 bilaterally, inferior facets of L5 bilaterally, superior facets  of S1 bilaterally, and the superior aspect of the S1 lamina.  Ligament  flavum and epidural scar were elevated and resected in piecemeal  fashion.  The underlying thecal sac and exiting L5 and S1 nerve root  were identified.  Wide decompressive foraminotomies were performed along  the course of exiting nerve roots bilaterally.  Epidural venous plexus  coagulated and cut.  Bilateral diskectomies were then performed with the  thecal sac and nerve roots were protected.  Disk space was distracted up  to 8  mm and then 8 mm distractor left in the patient's right side.  Thecal sac and nerve roots were protected on the right side.  Disk space  was then reamed, cut with 8-mm tangent instrument.  Soft tissue was then  removed from the interspace.  An 8 x 22 mm Telamon cage packed with  morselized autograft and then packed into place and recessed  approximately 2 mm posterior cortical margin.  Distractor was removed  from the patient's right side.  Thecal sac and nerve roots were  protected on the right side.  Disk space was then reamed and then cut  with 8-mm tangent instrument.  Soft tissues was then further removed  from the interspace.  Disk space with curettage.   Morselized autograft  was then packed into the interspace.  An 8 x 26 mm tangent wedge was  then packed into place and recessed approximately 2 mm from the  posterior cortical margin of L5.  Pedicles of L5 and S1 were then  identified using surface landmarks with intraoperative fluoroscopy.  Superficial bone overlying the pedicle was then removed using high-speed  drill.  The pedicles were then probed using pedicle awl.  The pedicle  awl track was then tapped with 5.25-mm screw tapper.  Each screw tap  hole was then probed and found to be solid bone.  A 6.75 x 40 mm radius  screw was placed bilaterally at L5 and 6.75 x 35 mm screw was placed  bilaterally at S1.  Transverse processes of sacral ala were then  decorticated utilizing high-speed drill.  Morselized autograft was  packed posterolaterally for later fusion.  Short-segment titanium rod  was then placed through the screw holes at L5 and S1.  The locking caps  were placed through the screw.  The locking caps were then engaged with  a constructed under compression.  Final images revealed good position of  the bone graft, hardware at proper operative level with normal alignment  of spine.  Wound was then irrigated one final time.  Gelfoam was placed  topically for hemostasis and found to be good.  A medium Hemovac drain  was left at upper spacer.  Wound was then closed in layers with Vicryl  suture.  Steri-Strips and sterile dressings were applied.  There were no  complications.  The patient tolerated the procedure well and she  returned to recovery room postoperatively.           ______________________________  Kathaleen Maser Pool, M.D.     HAP/MEDQ  D:  12/22/2007  T:  12/23/2007  Job:  [id number]

## 2010-06-06 NOTE — Assessment & Plan Note (Signed)
[facility]                             PULMONARY OFFICE NOTE   NAME:TROXLEREfrata, Brunner                       MRN:          [id number]  DATE:08/01/2006                            DOB:          1945-05-31    PROBLEM:  1. Obstructive sleep apnea.  2. Allergic rhinitis.  3. Asthma.  4. Fibromyalgia.  5. Esophageal reflux.  6. Obesity/bariatric surgery.   HISTORY:  She says she is breathing fairly well. She continues her  allergy vaccine with no problems at 1:10. Her husband gives her shots.  They do have an Epi-pen they have never needed to use. We discussed this  again. She says she always has a global headache that never resolves  since the night of a myelogram 3 weeks ago, around the same time she was  taken off of Lyrica. She questions whether this is allergy some how  causing the headache. She was hospitalized with altered mental status  attributed to combination of Lyrica and Xyzal. She feels tired but  continues using CPAP at 13 cm of water. Caffeine helps only a little,  had blood work 2 days ago at [facility]. She thinks she  has a little low grade fever, but no chills, or sweats. Nothing bloody  or purulent. A head CT was negative in June which would also cover any  obvious sinusitis.   MEDICATIONS:  1. Prevacid 30 mg b.i.d.  2. Allergy vaccine.  3. Singular 10 mg.  4. Lamictal 100 mg b.i.d.  5. Lexapro 5 mg.  6. Flonase.  7. Advair 250/50.  8. Zyrtec D used b.i.d. p.r.n.  9. Enablex 15 mg.  10.Trazodone 100 mg.  11.Ipratropium 0.06% nasal spray used p.r.n. for rhinorrhea.  12.CPAP 13 cm of water.  13.Clonazepam 0.5 mg used at bedtime p.r.n.  14.She does have the Epi-pen.   DRUG INTOLERANT:  PENICILLIN, CODEINE, ERYTHROMYCIN, AND DEMEROL.   OBJECTIVE:  Weight 189 pounds, blood pressure 142/78, pulse 58, room air  saturation 98%.  Minor sniffing but nasal mucosa looks normal. Pharynx looks clear.  Conjunctivae are  not injected. There is no periorbital edema. No  cervical adenopathy. Voice quality is normal with no strider.  LUNG FIELDS: Sound clear.   IMPRESSION:  1. May some rhinosinusitis but not much or it would have shown on the      CT scan. I suspect her symptoms reflect her medications at least in      part.  2. Obstructive sleep apnea, seems to be controlled adequately on CPAP      at 13.   PLAN:  Continue singular and Advair with medication discussion done. Try  Sudafed PE as a decongestant p.r.n. Today she was given Neosynephrine  inhalation and Depo-Medrol 80 mg IM for trial. She was encouraged to  continue fluids and we discussed saline lavage. Schedule return 3  months, earlier p.r.n.     Clinton D. Maple Hudson, MD, Tonny Bollman, FACP  Electronically Signed    CDY/MedQ  DD: 08/07/2006  DT: 08/08/2006  Job #: [id number]

## 2010-06-06 NOTE — Consult Note (Signed)
[name], [name] NO.:  [id number]   MEDICAL RECORD NO.:  [id number]          PATIENT TYPE:  INP   LOCATION:  4731                         FACILITY:  [facility]   PHYSICIAN:  Bernette Redbird, M.D.   DATE OF BIRTH:  November 27, 1945   DATE OF CONSULTATION:  07/19/2006  DATE OF DISCHARGE:  07/20/2006                                 CONSULTATION   Dr. Ashley Royalty of the [facility] asked [place] to see this  65 year old female because of elevated liver chemistries.   [name] is known to my partner, Dr. Doy Mince, who did a  colonoscopy on her about a year ago for iron deficiency anemia.   She was admitted to the hospital 2 days ago in an obtunded state and,  upon admission, was found to have new-onset elevation of liver  chemistries with AST 632, ALT 321.  Over the ensuing 2 days, those  levels have dropped substantially, with an AST of 61, an ALT of 135.  Bilirubin is 0.4, alk phos 149.   The patient had an ultrasound that does not show any fatty infiltration  of the liver and shows a normal caliber CBD of about 5-mm, status post  remote cholecystectomy.  She also had a CT earlier this year in  February, that did not show any evidence of chronic liver disease.   The patient is on multiple medications but has had previously obtained  liver chemistries that were always in the normal range by her report.   The patient has abdominal pain, status post gastric bypass surgery and  problems of an abdominal ventral hernia, panniculectomy, etc., so she  does have migratory abdominal pain but really has not had classic  fevers, shaking chills, and right upper quadrant pain to suggest recent  cholangitis.  She does think she has had some fever and chills in recent  weeks but not at the time of clinical presentation.   PAST MEDICAL HISTORY:   ALLERGIES:  1. CODEINE.  2. DEMEROL.  3. ERYTHROMYCIN.  4. PENICILLIN.  5. KEFLEX.   OUTPATIENT MEDICATIONS:   Advair, Singulair, Atrovent, Lamictal, Lexapro,  Enablex, Lyrica, Xyzal, Flonase, trazodone, clonazepam, Xanax, Astelin,  Topamax, Darvocet (uses sparingly).   OPERATIONS:  1. Gastric bypass surgery by Dr. Jayme Cloud in [place] 4 years      ago.  It was actually a duodenal switch operation that had to be      revised after about a year when she lost weight from approximately      300 pounds to 115 pounds over 15 months.  Apparently, the duodenal      switch was not reversed, but rather, the patient underwent a      segmental small bowel resection at that time, according to the      patient.  2. She subsequently had a panniculectomy with complications of scar      tissue and an abdominal wall hernia, which is anticipated to be      revised at [facility], plastic surgery department,      later this year.  3.  She is status post a remote cholecystectomy and an  4. Appendectomy at age 17.  5. And also has had cervical spine surgery.  6. And is awaiting lumbar spine surgery later this year.   MEDICAL ILLNESSES:  1. Morbid obesity, now substantially improved status post bariatric      surgery.  2. GERD.  3. Obstructive sleep apnea.  4. Abdominal wall panniculitis and subsequent herniation as noted      above.  5. Depression.  6. Fibromyalgia.  7. Asthma.  8. Allergic rhinitis.   HABITS:  Nonsmoker, nondrinker.   FAMILY HISTORY:  Alcoholic liver disease in both parents, negative for  colon cancer.   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   REVIEW OF SYSTEMS:  She finds that she cannot swallow quickly or  repeatedly but can swallow small amounts.  It does not sound as though  she has any obstructive esophageal dysphagia, rather it sounds more like  a motility problem.  I do not think there has been any problem with food  impactions, at least she does not mention any.  Occasional heartburn, no   constipation, diarrhea, or rectal bleeding.  Her current weight by her  report is 185.   PHYSICAL EXAMINATION:  GENERAL:  A pleasant, articulate, Caucasian  female in no evident distress appearing neither anxious nor depressed,  anicteric, no evident pallor.  CHEST:  Clear at this time.  HEART:  Has a soft 1/6 systolic ejection murmur.  ABDOMEN:  Active bowel sounds and a rather prominent bikini type  incision corresponding her panniculectomy.  She has a fist sized right  lower quadrant abdominal wall hernia noticeable when she lives her head  off the bed.  No obvious organomegaly, no significant tenderness.  No  guarding.   LABORATORY:  Drug screen was negative upon admission.  BNP was elevated  at 155.  Hepatitis B surface antigen is negative.  TSH normal.  Acetaminophen level undetected.  Alcohol undetected.  Chemistries are  pertinent as per above.  Normal renal function and electrolytes.  Normal  white count of 5,000 on admission with unremarkable differential.  Albumin 3.5.  Platelets normal at 204,000.   IMPRESSION:  1. New onset, rapidly resolving elevation of liver chemistries,      hepatocellular pattern.  2. Status post bariatric surgery with subsequent revision for morbid      obesity.  3. Status post cholecystectomy, panniculectomy, bariatric surgery      revision, appendectomy.   DISCUSSION AND PLAN:  An abrupt rise in liver chemistries like this,  followed by a rapid decline in the elevation, would be most suggestive  of one of several processes, including reactive hepatopathy from an  infectious process or shock liver (for example, when she came in  obtunded apparently from over medication __________ ), a toxic exposure  (no evident exposure to the classic hepatic toxins such as acetaminophen  or herbal remedies), or transient obstruction from a common duct stone  (seems unlikely given the absence of obvious upper abdominal pain or elevation of white count upon  admission).   PLAN:  Follow liver chemistries to see if they normalize.  The patient  should probably have an MRCP (MRI cholangiogram) as an outpatient at  some point in the near future to help exclude a common bile duct stone  (doubt).  The patient states she really cannot have an MRI scan at the  hospital scanner because she  is claustrophobic and needs to have an open  MRI performed.   I anticipate this patient's liver chemistries will totally resolve and  that we will never know the exact etiology of her transient rise.  Note  that her AST was normal when checked as an outpatient at [facility] practice on March 8th of this year (22, normal up to 40) but her  SGPT was minimally elevated at 50 (normal up to 40).           ______________________________  Bernette Redbird, M.D.     RB/MEDQ  D:  07/19/2006  T:  07/20/2006  Job:  [id number]   cc:   Marjory Lies, M.D.

## 2010-06-06 NOTE — Assessment & Plan Note (Signed)
A 65 year old female with a history of recurrent abdominal wall hernia,  several open abdominal procedures, panniculectomy.  She has had chronic  postoperative abdominal pain.  She also has had inguinal and  iliohypogastric neuritis which have improved, and then a myofascial  abdominal pain with some scar supersensitivity which have also improved  after physical therapy consisting of desensitization, abdominal  extension training, as well as some strengthening.  She continues to  follow up with Dr. Ethelene Hal in terms of her left lower extremity pain,  complaining of Neurontin making her a bit drowsy.  I encouraged her to  follow up and explore her options.   She has also had numbness and tingling in the right greater than left  hand, progressive in nature.  EMG [facility] upper extremity May 15, 2007  demonstrating right greater than left carpal tunnel effecting median  motor and sensory nerves.  She had normal ulnar motor and sensory and  normal right upper extremity needle EMG screen.  No evidence of cervical  radiculitis.  She has completed her physical therapy.  She can walk 20  to 30 minutes at a time.  She is back working out at [employer].  She still  needs some assistance with certain household duties.  She does work at  least 30 hours a week.   Her blood pressure is 139/63, pulse 67, respirations 18, satting 98% on  room air.  GENERAL:  No acute distress.  Mood and affect appropriate.  An  overweight female, appears comfortable, neatly groomed.  ABDOMEN:  No evidence of distension.  No guarding or rebound.  She had  only minimal tenderness to palpation over the right sided rectus  abdominis muscle.  She does have mild scar supersensitivity in the  suprapubic region.  She needs to get up sideways rather than do a sit up  to get up.  Gait is normal.   IMPRESSION:  1. Myofascial abdominal pain, status post multiple abdominal      surgeries, improved, along with her scar supersensitivity.  2. Iliohypogastric neuritis, resolved.  3. Carpal tunnel syndrome, right greater than left.   I will see her back on a p.r.n. basis.  She plans to follow up with Dr.  Melvyn Novas in terms of her carpal tunnel, scheduling surgery for this.  She  will follow up with Dr. Ethelene Hal in terms of her left lower extremity pain.  She can call me on a p.r.n. basis should her abdominal pain worsen.  It  is currently at a mild range.      Erick Colace, M.D.  Electronically Signed     AEK/MedQ  D:  06/10/2007 08:51:01  T:  06/10/2007 09:20:07  Job #:  [id number]   cc:   Madelynn Done, MD  Fax: [phone]   Royanne Foots, MD   Marjory Lies, M.D.  Fax: [phone]   Richard D. Ethelene Hal, M.D.  Fax: [phone]

## 2010-06-09 NOTE — Assessment & Plan Note (Signed)
[facility]                               PULMONARY OFFICE NOTE   [name], [name]                       MRN:          [id number]  DATE:06/26/2005                            DOB:          30-Dec-1945    PROBLEM LIST:  1.  Obstructive sleep apnea.  2.  Fibromyalgia.  3.  Asthma.  4.  Esophageal reflux.  5.  Bariatric surgery.  6.  Allergic rhinitis.   HISTORY OF PRESENT ILLNESS:  This never smoker, had previously been followed  at [facility] and has transferred here for continuity care.  She was diagnosed with obstructive sleep apnea in March of 2001 with an  apnea hypopnea index of 156 per hour. C-PAP was titrated then to 15 CWP.  Since then, she had undergone bariatric surgery. She weighed 300 pounds in  2002 and was down to 148 pounds in July 2005. Allergy testing in 2002 had  shown reactions for grass, weed, and tree pollens. Because of persistent  nasal complaints and some asthma, she went on allergy vaccine with no  problems. Husband has been giving her injections and she has had only  occasional mild local itching. She stopped C-PAP for a while and then  resumed in on her own, as her weight came back. She had had complaints of  sneezing, chest congestion, and green sputum, which never completely cleared  and watering eyes and hoarseness, especially with prolonged time outdoors.  Some frontal headache. C-PAP had been at 12 CWP.   MEDICATIONS:  Prevacid 30 mg b.i.d., allergy vaccine, Singulair 10 mg,  Lamictal 100 mg b.i.d., Lexapro 5 mg, Flonase, Advair 250/50, Zyrtec D 12  hour used occasionally p.r.n., Enablex 15 mg, Trazadone 100 mg, Ipratropium  inhaler. She had taken a course of prednisone and used occasional Xanax.   ALLERGIES:  PENICILLIN, CODEINE, ERYTHROMYCIN, DEMEROL.   OBJECTIVE:  VITAL SIGNS:  Weight 188 pounds. Blood pressure 144/88, pulse  regular at 60. Room air saturation 96%.  GENERAL:  Alert.  Significantly overweight.  HEENT:  Long palate 4/4. Mild hoarseness, minimally, without stridor.  HEART:  Sounds regular with a grade 1 over 6 systolic murmur at the upper  sternal border.   IMPRESSION:  1.  Obstructive sleep apnea with additional comment that she describes      hypnic jerks, just as she falls asleep some nights and these are benign.  2.  Allergic rhinitis.  3.  Bronchitis with asthma.   PLAN:  We scheduled a CT scan of the sinuses and chest x-ray. [facility] was to increase her C-PAP from 12 to 13. She will try Clonazepam  0.5 mg at bedtime p.r.n. to improve sleep and to schedule return in 2  months, earlier p.r.n.                                   Clinton D. Maple Hudson, MD, FCCP, FACP   CDY/MedQ  DD:  08/24/2005  DT:  08/25/2005  Job #:  [id number]  cc:   [facility]  Madaline Savage, MD

## 2010-06-09 NOTE — Assessment & Plan Note (Signed)
[facility]                             PULMONARY OFFICE NOTE   JANEQUA, KIPNIS                       MRN:          [id number]  DATE:05/23/2006                            DOB:          03-Oct-1945    PROBLEM LIST:  1. Obstructive sleep apnea.  2. Allergic rhinitis.  3. Asthma.  4. Fibromyalgia.  5. Esophageal reflux.  6. Obesity/bariatric surgery.   HISTORY:  She complains of pollen causing head congestion not relieved  by Zyrtec or Veramyst. She continues her allergy vaccine injections  without problem.   MEDICATIONS:  1. Prevacid 30 mg b.i.d.  2. Allergy vaccine.  3. Singulair 10 mg.  4. Lamictal 100 mg b.i.d.  5. Lexapro 5 mg .  6. Flonase.  7. Advair 250/50.  8. Zyrtec D.  9. Enablex 15 mg.  10.Trazodone 100 mg.  11.Ipratropium 0.06% nasal spray.  12.CPAP at 13 CWP.  13.Clonazepam 0.5 mg.  14.Epipen.   DRUG INTOLERANCES:  PENICILLIN, CODEINE, ERYTHROMYCIN, DEMEROL.   OBJECTIVE:  VITAL SIGNS:  Weight 194 pounds, blood pressure 122/62,  pulse regular 71, room air saturation 97%.  HEENT:  Mild hoarseness and nasal stuffiness without visible drainage or  polyps. Conjunctiva are clear.  LUNGS:  Clear.  HEART:  Sounds normal.   IMPRESSION:  1. Seasonal exacerbation of allergic rhinitis despite appropriate      medications and allergy vaccine.  2. Obstructive sleep apnea seems well controlled although I am      watching her slow continued weight gain after bariatric surgery and      we discussed the importance of this.   PLAN:  1. Refill Flonase, use 2 sprays b.i.d. during peak pollen season      before dropping back to daily.  2. Retry sample Astelin nasal spray once each nostril b.i.d. p.r.n.  3. Depo-Medrol 80 mg IM x1 with steroid talk done again.  4. Samples of xyzal to compare with other antihistamines.  5. Schedule return in 3 months, earlier p.r.n.     Clinton D. Maple Hudson, MD, Tonny Bollman, FACP  Electronically Signed    CDY/MedQ  DD: 05/23/2006  DT: 05/24/2006  Job #: [id number]   cc:   [facility]

## 2010-06-09 NOTE — Op Note (Signed)
[name], [name]                ACCOUNT NO.:  [id number]   MEDICAL RECORD NO.:  [id number]          PATIENT TYPE:  AMB   LOCATION:  DSC                          FACILITY:  [facility]   PHYSICIAN:  Earvin Hansen L. Shon Hough, M.D.DATE OF BIRTH:  August 06, 1945   DATE OF PROCEDURE:  07/14/2004  DATE OF DISCHARGE:                                 OPERATIVE REPORT   INDICATIONS:  A 65 year old lady with severe panniculus.  She is status post  GI bypass surgery and lost over 150 pounds. Now, has increased panniculus as  well as deficits on the ventral part of her abdomen with ventral hernia in  the upper right paramedian area, the area just above the bypass surgery  incision.  Her panniculus has caused panniculitis, irritation under the  folds causing rash area, and increased problems with that area.  The patient  is now being prepared for panniculectomy.  She has 4+ diastasis recti, and  also has a ventral hernia above the previous incision area from the bypass  surgery.   PROCEDURE PERFORMED:  1.  Panniculectomy with repair of diastasis recti.  2.  Repair of ventral hernia x2.   ANESTHESIA:  General.   Preoperatively, the patient stood up and was drawn for the midline of the  abdomen as well as the drawings for the W-plasty panniculectomy.  She then  underwent general anesthesia, intubated orally.  Prep was done to the chest,  breasts, abdominal areas using Betadine soap and solution.  Wall of sterile  towels and draped so as to make sterile solution field.  The patient  underwent Foley catheter insertion sterilely.  Sterile towels and drapes  were placed so as to make a sterile field.  Tumescent was injected  throughout the abdominal area.  The incisions were opened with #15 blade.  Dissection was carried down through Scarpa fascia to underlying areolar  tissue overlying the rectus musculature.  This was opened up to the  umbilicus.  The umbilicus was excised on its pedicle.  The dissection  carried all the way up to the xiphoid process as well as the right and left  upper quadrants.  Hemostasis was maintained during the procedure using a  Bovie on coagulation. When dissecting up, it was obvious hernia in the right  paramedian area just above the incision area that was done previously by the  general surgeon, Dr. Jayme Cloud, for the bypass horizontally.  On the lower  right area was also increased bulge herniation measuring 6 x 8 cm.  The  first one measured approximately 8 x 6 cm.  The fascia was dissected out.  The hernia sac was very thin over the upper one.  It was excised.  Then, the  area was repaired with Prolene mesh 3 x 6, double on itself, and repair with  multiple sutures of 2-0 Surgilon in the fascia.  The same was done of the  hernia at the right lower abdomen.  After this irrigation was done after  proper hemostasis.  The patient then underwent repair of the severe  diastasis recti with a running #1 Prolene from  the xiphoid process to the  umbilicus, and then from the umbilicus down to the suprapubic area.   Next, the patient was placed in jack-knife position.  All the excess skin  was then excised.  Hemostasis was maintained with the Bovie anticoagulation.  The tissues were reapproximated with 2-0 Monocryl x2 layers, subdural suture  of 3-0 Monocryl, then a running subcuticular stitch of 3-0 Monocryl.  The  wounds were drained with two large Hemovac drains which was placed in the  depths of the wound and brought up through the suprapubic area.  Liposuction  was fashioned over the right and left ischial areas for contour as well as  upper and lower abdomen.  The wounds were cleansed.  Steri-Strips and soft  dressings were applied.  New opening was made for umbilicus and brought back  through and secured with 3-0 Prolene and subcu with 3-0 Monocryl.  Abdominal  support was applied with bulky dressings.  She withstood the procedures very  well.   ESTIMATED BLOOD  LOSS:  200 mL.   COMPLICATIONS:  None.   She was then taken to recovery in excellent condition.       GLT/MEDQ  D:  07/14/2004  T:  07/15/2004  Job:  [id number]

## 2010-06-09 NOTE — Op Note (Signed)
[name], [name]                ACCOUNT NO.:  [id number]   MEDICAL RECORD NO.:  [id number]          PATIENT TYPE:  AMB   LOCATION:  ENDO                         FACILITY:  [facility]   PHYSICIAN:  Shirley Friar, MDDATE OF BIRTH:  26-Nov-1945   DATE OF PROCEDURE:  03/12/2005  DATE OF DISCHARGE:                                 OPERATIVE REPORT   PROCEDURE PERFORMED:  Colonoscopy.   REQUESTING PHYSICIAN:  Lauretta I. Odogwu, M.D.   INDICATIONS FOR PROCEDURE:  Iron deficiency anemia.   MEDICATIONS:  Fentanyl 100 mcg IV, Versed 7 mg IV.   FINDINGS:  Rectal exam was normal.  An adult adjustable colonoscope was  inserted into a poorly prepped colon and advanced to the splenic flexure.  Examination was terminated due to solid stool throughout the colon that  could not be irrigated and aspirated away.  Colonoscope was withdrawn and  above findings were confirmed.   ASSESSMENT:  Poor prep, the scope was terminated at the splenic flexure due  to solid stool.   PLAN:  1.  Repeat procedure with Colyte at a later date.      Shirley Friar, MD  Electronically Signed     VCS/MEDQ  D:  03/12/2005  T:  03/12/2005  Job:  [id number]   cc:   Vicente Serene I. Odogwu, M.D.  Fax: [phone]

## 2010-06-09 NOTE — Assessment & Plan Note (Signed)
[facility]                             PULMONARY OFFICE NOTE   NAME:TROXLERMiel, Wisener                       MRN:          [id number]  DATE:02/22/2006                            DOB:          1945-09-28    PROBLEMS:  1. Obstructive sleep apnea.  2. Allergic rhinitis.  3. Asthma.  4. Fibromyalgia.  5. Esophageal reflux.  6. Obesity/bariatric surgery.   HISTORY:  She continues comfortably with CPAP at 13 except when she has  had more nasal congestion.  She had had a bronchitis syndrome 8 or 10  weeks ago and took an antibiotic then.  It is noting a little persistent  cough and sometimes some frontal pressure.  She complains that her eyes  run and she is having some nasal drip.  Medications do not help.  She  does not see anything purulent but feels she is having postnasal  drainage more than chest congestion.  She denies reflux.  Her husband  gives her allergy vaccine at 1:10 with no problems.   MEDICATIONS:  1. Prevacid 30 mg b.i.d.  2. Allergy vaccine.  3. Singulair 10 mg.  4. Lamictal 100 mg b.i.d.  5. Lexapro 5 mg.  6. Flonase.  7. Advair 250/50.  8. Zyrtec D b.i.d. p.r.n.  9. Enablex 15 mg.  10.Trazodone 100 mg.  11.Ipratropium.  12.CPAP at 13.  13.Clonazepam 0.5 mg.  14.P.r.n. use of prednisone.  15.Xanax 0.5 mg.  16.She does have an EpiPen which she has not needed to use.   DRUG INTOLERANCE TO PENICILLIN AND CODEINE, ERYTHROMYCIN, AND DEMEROL.   OBJECTIVE:  Weight 193 pounds, blood pressure 158/82, pulse regular 54,  room air saturation 96%.  Palate length is 4/4, hiding any postnasal  drainage.  There is mild nasal stuffiness without periorbital puffiness  or conjunctival injection.  LUNGS:  Sounded clear with quiet auscultation but cough sounded somewhat  congested.  HEART:  Sounds regular.   IMPRESSION:  1. Allergic rhinitis with question of a low grade sinusitis and      bronchitis, residual from her infection earlier.  2. Obstructive sleep apnea is otherwise adequately controlled with      continuous positive airway pressure, but I am concerned about her      gradual weight gain particularly in the context of previous      bariatric surgery.   PLAN:  1. Continue allergy vaccine.  2. We refilled Singulair, her ipratropium 0.06% nasal spray, and      Flonase but I gave her a sample of Veramyst to try instead of the      Flonase initially.  She is given Ceftin 500 mg b.i.d. for 7 days,      nasal nebulizer treatment with Neo-Synephrine, Depo-Medrol 80 mg      intramuscular.   Schedule return in 3 months, earlier p.r.n.     Clinton D. Maple Hudson, MD, Tonny Bollman, FACP  Electronically Signed    CDY/MedQ  DD: 02/23/2006  DT: 02/23/2006  Job #: [id number]   cc:   [facility]

## 2010-06-09 NOTE — Op Note (Signed)
[name], [name]                          ACCOUNT NO.:  [id number]   MEDICAL RECORD NO.:  [id number]                   PATIENT TYPE:  OIB   LOCATION:  2899                                 FACILITY:  [facility]   PHYSICIAN:  Kathaleen Maser. Pool, M.D.                 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   PROCEDURE:  C5-6 and C6-7 anterior cervical fusion with allograft anterior  plating.   SURGEON:  Kathaleen Maser. Pool, M.D.   ASSISTANT:  Donalee Citrin, M.D.   ANESTHESIA:  General endotracheal.   INDICATIONS FOR PROCEDURE:  The patient is a 65 year old female, status post  previous C7-T1 anterior cervical diskectomy and fusion remotely in the past.  The patient presents now with worsening neck and bilateral upper extremity  symptoms, failing all conservative management.  Workup has demonstrated  evidence of progressive stenosis at the C5-6 and C6-7 levels, spinal cord  compression.  The patient presents now for a two-level anterior  decompression and fusion with instrumentation for hopeful improvement of her  symptoms.   DESCRIPTION OF PROCEDURE:  The patient was brought to the operating room and  placed in the spine position.  After an adequate level of anesthesia was  achieved, with the patient supine and the neck slightly extended and held in  place with Holter traction.  The patient's anterior cervical region was  prepped and draped sterile.  A #10 blade was used to make a linear skin  incision overlying the C6 vertebral level.  This was then carried down  sharply to the platysma.  The platysma was then divided vertically and  dissection proceeded along the medial border of the  sternocleidomastoid  muscle and carotid sheath.  The trachea and esophagus are mobilized and  retracted towards the left.  The prevertebral fascia is stripped off the  anterior spinal column.  The longus coli muscle was then elevated  bilaterally using electrocautery.  Deep self-retaining retractor was placed.  Intraoperative fluoroscopy was used, and the C5-6 and C6-7 levels were  confirmed.  The disk space at both levels were then incised with a #15 blade  in a rectangular fashion.  A wide disk space cleanout was then achieved  using pituitary rongeurs, forward and backward rongeurs and Carlens curets,  Kerrison rongeurs and a high-speed drill.  All elements of the disk were  removed down to the level of the posterior annulus.  The microscope was  brought into the field and used throughout the remainder of the diskectomy.  The remaining aspect of the annulus and osteophytes were removed using the  high-speed drill down to the level  of the posterior longitudinal ligament.  The posterior longitudinal ligament was then elevated and resected in a  piecemeal fashion using Kerrison rongeurs.  The underlying thecal sac was  identified.  A wide central decompression was then performed, undercutting  the bodies of C5 and C6.  Decompression then proceeded out each neural  foramen.  Wide foraminotomy was performed along the course of the exiting C6  nerve roots bilaterally.  A blunt probe was used both superiorly and  inferiorly at each foramen.  There was no evidence of injury to the thecal  sac or nerve roots.  The procedure was then repeated  at C6-7, again without  complication.  The wound was then irrigated with antibiotic solution.  Gelfoam was placed topically for hemostasis and found to be good.  An 8 mm  patellar wedge allograft was then impacted in place and recessed  approximately 1 mm from the anterior cortical margin at both C5-6 and C6-7.  A 42 mm Atlantis anterior cervical plate was  then placed over the C5, C6 and  C7 levels.  This was then attached under fluoroscopic guidance using 13 mm  very long screws.  All screws were given a final tightening and found to be  solid in bone, and a locking screw was engaged.  X-rays taken revealed good  position of the bone grafts at the proper operative level.  The wound was  then irrigated with antibiotic solution.  Gelfoam was placed for operative  hemostasis and found to be good.  The microscope and retractor system were  removed.  Hemostasis in the muscle achieved with electrocautery.  The wound  was closed in layers with Vicryl sutures.  Steri-Strips and a sterile  dressing were applied.  There were no apparent complications.  The patient tolerated the procedure well and she returned to the recovery  room postoperatively.                                               Henry A. Pool, M.D.    HAP/MEDQ  D:  02/01/2003  T:  02/01/2003  Job:  [id number]

## 2010-06-09 NOTE — Op Note (Signed)
Gail, Wood                ACCOUNT NO.:  [id number]   MEDICAL RECORD NO.:  [id number]          PATIENT TYPE:  AMB   LOCATION:  ENDO                         FACILITY:  [facility]   PHYSICIAN:  Shirley Friar, MDDATE OF BIRTH:  1945/10/10   DATE OF PROCEDURE:  03/16/2005  DATE OF DISCHARGE:                                 OPERATIVE REPORT   REFERRING PHYSICIAN:  Lauretta I. Odogwu, M.D.   PROCEDURE:  Colonoscopy.   ENDOSCOPIST:  Shirley Friar, MD   INDICATION:  Iron deficiency anemia.   MEDICATIONS:  Fentanyl 120 mcg IV, Versed 14 mg IV.   FINDINGS:  Rectal exam was normal.  An adult adjustable colonoscope was  inserted into a fair-prepped colon and advanced to the cecum, where the  ileocecal valve and appendiceal orifice were identified.  Careful withdrawal  of the colonoscope revealed no mucosal abnormalities and no polyps seen,  although due to the fair prep, I may not have been able to visualize small  (less than 4-mm) polyps.  No diverticulosis or lesions were seen.  Retroflexion was done and she had small internal hemorrhoids.   IMPRESSION:  Small internal hemorrhoids, otherwise negative colonoscopy.   PLAN:  1.  High-fiber diet.  2.  Repeat colonoscopy in 5 years.      Shirley Friar, MD  Electronically Signed     VCS/MEDQ  D:  03/16/2005  T:  03/17/2005  Job:  [id number]   cc:   Vicente Serene I. Odogwu, M.D.  Fax: [phone]

## 2010-06-09 NOTE — Assessment & Plan Note (Signed)
[facility]                               PULMONARY OFFICE NOTE   NAME:TROXLERMarisel, [name]                       MRN:          [id number]  DATE:08/24/2005                            DOB:          Oct 31, 1945    PULMONARY ALLERGY FOLLOW-UP:   PROBLEMS:  1.  Obstructive sleep apnea.  2.  Allergic rhinitis.  3.  Asthma.  4.  Fibromyalgia.  5.  Esophageal reflux.  6.  Obesity/bariatric surgery.   HISTORY:  She returns for follow-up allergy skin testing as planned, saying  that off of Zyrtec for a few days she is having increased sniffing and  sneezing.  She has had no cough or wheeze.  Since she lost weight she is  spending more time outdoors, and she thinks she is exposed accordingly.  [facility] increased her CPAP pressure to 13 CWP but she thinks  it is still not enough.  Although she says her chest is comfortable, she  admits using Advair now on a p.r.n. basis for short intervals when needed at  250/50 mcg.  Of many antihistamines and combination medicines tried for her  nasal congestion, she likes Zyrtec D the best and supplements it with  Flonase one puff each nostril daily.  Incidental complaint of poison ivy  today.   MEDICATIONS:  List is reviewed and is correct as charted.   OBJECTIVE:  VITAL SIGNS:  Weight 185 pounds, BP 148/76, pulse regular at 62,  room air saturation 63%.  GENERAL:  Calmly obese.  Again is heard a trace grade 1/6 murmur at the left  upper sternal border.  HEENT:  Eyes and throat are clear.  She has repeated watery-sounding  sniffing.  Pharynx is clear.  LUNGS:  Clear.  CARDIAC:  Heart sounds are regular.   SKIN TEST:  Positive histamine, negative diluent controls.  Broadly positive  to a variety of grass, weed and tree pollens, house dust, dust mite.  This  is more extensive than her reactions on her first allergy testing, but the  overall profile was similar.   IMPRESSION:  1.  Obstructive sleep  apnea is close to adequately controlled if she can      keep her weight down, but we are going to try a higher CPAP pressure to      see how she feels with that.  2.  Allergic rhinitis.  May respond to an increase in Flonase to two puffs      each nostril daily.  I have also talked with her about the more      aggressive formal saline nasal lavage.  This may help some.  3.  Asthma.  It is mild, intermittent, and adequately controlled with her      medications.   PLAN:  1.  We are going to continue her present vaccine for now, although we may      come back to look at a restart with a remix in the future if necessary.  2.  [facility] will increase her CPAP from 13 to 14 CWP.  3.  Increase Flonase to two puffs each nostril daily.  4.  Saline lavage.  5.  We have carefully talked through issues of risks, benefits, and      alternatives in regard to allergy shots.  She wants to continue having      her husband give them at home, and I discussed policy concerning      injections outside the medical office.  We carefully reviewed issues of      anaphylaxis, epinephrine and alternatives.  She reviewed and wished to      sign our waiver.  We can restart her allergy vaccine based on new skin      testing in the future if needed.  6.  Schedule return in 6 months, earlier p.r.n.                                   Clinton D. Maple Hudson, MD, [facility], FACP   CDY/MedQ  DD:  08/24/2005  DT:  08/25/2005  Job #:  [id number]   cc:   [facility]

## 2010-07-12 ENCOUNTER — Encounter (INDEPENDENT_AMBULATORY_CARE_PROVIDER_SITE_OTHER): Payer: Self-pay | Admitting: Surgery

## 2010-07-12 DIAGNOSIS — R6883 Chills (without fever): Secondary | ICD-10-CM

## 2010-07-12 DIAGNOSIS — Z780 Asymptomatic menopausal state: Secondary | ICD-10-CM | POA: Insufficient documentation

## 2010-07-12 DIAGNOSIS — R509 Fever, unspecified: Secondary | ICD-10-CM | POA: Insufficient documentation

## 2010-07-17 ENCOUNTER — Other Ambulatory Visit: Payer: Self-pay | Admitting: Hematology and Oncology

## 2010-07-17 ENCOUNTER — Encounter (HOSPITAL_BASED_OUTPATIENT_CLINIC_OR_DEPARTMENT_OTHER): Payer: BC Managed Care – PPO | Admitting: Hematology and Oncology

## 2010-07-17 DIAGNOSIS — D509 Iron deficiency anemia, unspecified: Secondary | ICD-10-CM

## 2010-07-17 DIAGNOSIS — Z9884 Bariatric surgery status: Secondary | ICD-10-CM

## 2010-07-17 LAB — CBC WITH DIFFERENTIAL/PLATELET
Basophils Absolute: 0 10*3/uL (ref 0.0–0.1)
Eosinophils Absolute: 0.2 10*3/uL (ref 0.0–0.5)
LYMPH%: 26 % (ref 14.0–49.7)
MCV: 90.1 fL (ref 79.5–101.0)
MONO%: 7.8 % (ref 0.0–14.0)
NEUT#: 3.9 10*3/uL (ref 1.5–6.5)
Platelets: 263 10*3/uL (ref 145–400)
RBC: 4.26 10*6/uL (ref 3.70–5.45)

## 2010-07-17 LAB — COMPREHENSIVE METABOLIC PANEL
Alkaline Phosphatase: 110 U/L (ref 39–117)
BUN: 14 mg/dL (ref 6–23)
Glucose, Bld: 78 mg/dL (ref 70–99)
Sodium: 141 mEq/L (ref 135–145)
Total Bilirubin: 0.3 mg/dL (ref 0.3–1.2)

## 2010-07-17 LAB — FOLATE: Folate: 12.2 ng/mL

## 2010-07-18 ENCOUNTER — Ambulatory Visit (INDEPENDENT_AMBULATORY_CARE_PROVIDER_SITE_OTHER): Payer: BC Managed Care – PPO

## 2010-07-18 DIAGNOSIS — J309 Allergic rhinitis, unspecified: Secondary | ICD-10-CM

## 2010-07-19 ENCOUNTER — Encounter: Payer: BC Managed Care – PPO | Admitting: Hematology and Oncology

## 2010-08-02 ENCOUNTER — Other Ambulatory Visit: Payer: Self-pay | Admitting: Internal Medicine

## 2010-08-18 ENCOUNTER — Ambulatory Visit: Payer: BC Managed Care – PPO

## 2010-08-18 ENCOUNTER — Ambulatory Visit: Payer: BC Managed Care – PPO | Admitting: Internal Medicine

## 2010-08-21 ENCOUNTER — Other Ambulatory Visit: Payer: Self-pay | Admitting: Internal Medicine

## 2010-09-15 ENCOUNTER — Ambulatory Visit (INDEPENDENT_AMBULATORY_CARE_PROVIDER_SITE_OTHER): Payer: BC Managed Care – PPO | Admitting: Family Medicine

## 2010-09-15 ENCOUNTER — Encounter: Payer: Self-pay | Admitting: Family Medicine

## 2010-09-15 ENCOUNTER — Other Ambulatory Visit: Payer: Self-pay

## 2010-09-15 VITALS — BP 118/78 | HR 68 | Temp 98.3°F | Ht 59.0 in | Wt 212.8 lb

## 2010-09-15 DIAGNOSIS — E785 Hyperlipidemia, unspecified: Secondary | ICD-10-CM

## 2010-09-15 DIAGNOSIS — IMO0001 Reserved for inherently not codable concepts without codable children: Secondary | ICD-10-CM

## 2010-09-15 DIAGNOSIS — C44320 Squamous cell carcinoma of skin of unspecified parts of face: Secondary | ICD-10-CM

## 2010-09-15 DIAGNOSIS — J019 Acute sinusitis, unspecified: Secondary | ICD-10-CM

## 2010-09-15 DIAGNOSIS — I10 Essential (primary) hypertension: Secondary | ICD-10-CM

## 2010-09-15 DIAGNOSIS — K219 Gastro-esophageal reflux disease without esophagitis: Secondary | ICD-10-CM

## 2010-09-15 DIAGNOSIS — R32 Unspecified urinary incontinence: Secondary | ICD-10-CM

## 2010-09-15 MED ORDER — PHENTERMINE HCL 15 MG PO CAPS: 15.0000 mg | ORAL_CAPSULE | ORAL | Status: DC

## 2010-09-15 MED ORDER — SOLIFENACIN SUCCINATE 10 MG PO TABS: 10.0000 mg | ORAL_TABLET | Freq: Every day | ORAL | Status: DC

## 2010-09-15 MED ORDER — BUTALBITAL-ACETAMINOPHEN 50-650 MG PO TABS: 1.0000 | ORAL_TABLET | Freq: Three times a day (TID) | ORAL | Status: DC | PRN

## 2010-09-15 MED ORDER — PANTOPRAZOLE SODIUM 40 MG PO TBEC: DELAYED_RELEASE_TABLET | ORAL | Status: DC

## 2010-09-15 MED ORDER — RANITIDINE HCL 150 MG PO CAPS: ORAL_CAPSULE | ORAL | Status: DC

## 2010-09-15 MED ORDER — PROMETHAZINE HCL 25 MG PO TABS: 25.0000 mg | ORAL_TABLET | Freq: Three times a day (TID) | ORAL | Status: DC | PRN

## 2010-09-15 NOTE — Telephone Encounter (Signed)
Faxed to pharmacy

## 2010-09-15 NOTE — Patient Instructions (Signed)
Headaches, FAQs   (Frequently Asked Questions)   MIGRAINE HEADACHES   Q: What is migraine? What causes it? How can I treat it?   A: Generally, migraine headaches begin as a dull ache. Then they develop into a constant, throbbing, and pulsating pain. You may experience pain at the temples. You may experience pain at the front or back of one or both sides of the head. The pain is usually accompanied by a combination of:   Nausea.  Vomiting.  Sensitivity to light and noise.    Some people (about 15%) experience an aura (see below) before an attack. The cause of migraine is believed to be chemical reactions in the brain. Treatment for migraine may include over-the-counter or prescription medications. It may also include self-help techniques. These include relaxation training and biofeedback.   Q: What is an aura?   A: About 15% of people with migraine get an “aura”. This is a sign of neurological symptoms that occur before a migraine headache. You may see wavy or jagged lines, dots, or flashing lights. You might experience tunnel vision or blind spots in one or both eyes. The aura can include visual or auditory hallucinations (something imagined). It may include disruptions in smell (such as strange odors), taste or touch. Other symptoms include:   Numbness.   A “pins and needles” sensation.   Difficulty in recalling or speaking the correct word.   These neurological events may last as long as 60 minutes. These symptoms will fade as the headache begins.   Q: What is a trigger?   A: Certain physical or environmental factors can lead to or “trigger” a migraine. These include:   Foods.   Hormonal changes.  Weather.   Stress.    It is important to remember that triggers are different for everyone. To help prevent migraine attacks, you need to figure out which triggers affect you. Keep a headache diary. This is a good way to track triggers. The diary will help you talk to your healthcare professional about your condition.    Q: Does weather affect migraines?   A: Bright sunshine, hot, humid conditions, and drastic changes in barometric pressure may lead to, or “trigger,” a migraine attack in some people. But studies have shown that weather does not act as a trigger for everyone with migraines.   Q: What is the link between migraine and hormones?   A: Hormones start and regulate many of your body's functions. Hormones keep your body in balance within a constantly changing environment. The levels of hormones in your body are unbalanced at times. Examples are during menstruation, pregnancy, or menopause. That can lead to a migraine attack. In fact, about three quarters of all women with migraine report that their attacks are related to the menstrual cycle.   Q: Is there an increased risk of stroke for migraine sufferers?   A: The likelihood of a migraine attack causing a stroke is very remote. That is not to say that migraine sufferers cannot have a stroke associated with their migraines. In persons under age 40, the most common associated factor for stroke is migraine headache. But over the course of a person's normal life span, the occurrence of migraine headache may actually be associated with a reduced risk of dying from cerebrovascular disease due to stroke.   Q: What are acute medications for migraine?   A: Acute medications are used to treat the pain of the headache after it has started. Examples over-the-counter medications, NSAIDs, ergots,   and triptans.   Q: What are the triptans?   A: Triptans are the newest class of abortive medications. They are specifically targeted to treat migraine. Triptans are vasoconstrictors. They moderate some chemical reactions in the brain. The triptans work on receptors in your brain. Triptans help to restore the balance of a neurotransmitter called serotonin. Fluctuations in levels of serotonin are thought to be a main cause of migraine.   Q: Are over-the-counter medications for migraine  effective?   A: Over-the-counter, or “OTC,” medications may be effective in relieving mild to moderate pain and associated symptoms of migraine. But you should see your caregiver before beginning any treatment regimen for migraine.   Q: What are preventive medications for migraine?   A: Preventive medications for migraine are sometimes referred to as “prophylactic” treatments. They are used to reduce the frequency, severity, and length of migraine attacks. Examples of preventive medications include antiepileptic medications, antidepressants, beta-blockers, calcium channel blockers, and NSAIDs (nonsteroidal anti-inflammatory drugs).   Q: Why are anticonvulsants used to treat migraine?   A: During the past few years, there has been an increased interest in antiepileptic drugs for the prevention of migraine. They are sometimes referred to as “anticonvulsants”. Both epilepsy and migraine may be caused by similar reactions in the brain.   Q: Why are antidepressants used to treat migraine?   A: Antidepressants are typically used to treat people with depression. They may reduce migraine frequency by regulating chemical levels, such as serotonin, in the brain.   Q: What alternative therapies are used to treat migraine?   A: The term “alternative therapies” is often used to describe treatments considered outside the scope of conventional Western medicine. Examples of alternative therapy include acupuncture, acupressure, and yoga. Another common alternative treatment is herbal therapy. Some herbs are believed to relieve headache pain. Always discuss alternative therapies with your caregiver before proceeding. Some herbal products contain arsenic and other toxins.   TENSION HEADACHES   Q: What is a tension-type headache? What causes it? How can I treat it?   A: Tension-type headaches occur randomly. They are often the result of temporary stress, anxiety, fatigue, or anger. Symptoms include soreness in your temples, a  tightening band-like sensation around your head (a “vice-like” ache). Symptoms can also include a pulling feeling, pressure sensations, and contracting head and neck muscles. The headache begins in your forehead, temples, or the back of your head and neck. Treatment for tension-type headache may include over-the-counter or prescription medications. Treatment may also include self-help techniques such as relaxation training and biofeedback.   CLUSTER HEADACHES   Q: What is a cluster headache? What causes it? How can I treat it?   A: Cluster headache gets its name because the attacks come in groups. The pain arrives with little, if any, warning. It is usually on one side of the head. A tearing or bloodshot eye and a runny nose on the same side of the headache may also accompany the pain. Cluster headaches are believed to be caused by chemical reactions in the brain. They have been described as the most severe and intense of any headache type. Treatment for cluster headache includes prescription medication and oxygen.   SINUS HEADACHES   Q: What is a sinus headache? What causes it? How can I treat it?   A: When a cavity in the bones of the face and skull (a sinus) becomes inflamed, the inflammation will cause localized pain. This condition is usually the result of an allergic reaction,   a tumor, or an infection. If your headache is caused by a sinus blockage, such as an infection, you will probably have a fever. An x-ray will confirm a sinus blockage. Your caregiver's treatment might include antibiotics for the infection, as well as antihistamines or decongestants.   REBOUND HEADACHES   Q: What is a rebound headache? What causes it? How can I treat it?   A: A pattern of taking acute headache medications too often can lead to a condition known as “rebound headache.” A pattern of taking too much headache medication includes taking it more than 2 days per week or in excessive amounts. That means more than the label or a  caregiver advises. With rebound headaches, your medications not only stop relieving pain, they actually begin to cause headaches. Doctors treat rebound headache by tapering the medication that is being overused. Sometimes your caregiver will gradually substitute a different type of treatment or medication. Stopping may be a challenge. Regularly overusing a medication increases the potential for serious side effects. Consult a caregiver if you regularly use headache medications more than 2 days per week or more than the label advises.   ADDITIONAL QUESTIONS AND ANSWERS   Q: What is biofeedback?   A: Biofeedback is a self-help treatment. Biofeedback uses special equipment to monitor your body's involuntary physical responses. Biofeedback monitors:   Breathing.   Pulse.   Heart rate.  Temperature.   Muscle tension.   Brain activity.    Biofeedback helps you refine and perfect your relaxation exercises. You learn to control the physical responses that are related to stress. Once the technique has been mastered, you do not need the equipment any more.   Q: Are headaches hereditary?   A: Four out of five (80%) of people that suffer report a family history of migraine. Scientists are not sure if this is genetic or a family predisposition. Despite the uncertainty, a child has a 50% chance of having migraine if one parent suffers. The child has a 75% chance if both parents suffer.   Q: Can children get headaches?   A: By the time they reach high school, most young people have experienced some type of headache. Many safe and effective approaches or medications can prevent a headache from occurring or stop it after it has begun.   Q: What type of doctor should I see to diagnose and treat my headache?   A: Start with your primary caregiver. Discuss his or her experience and approach to headaches. Discuss methods of classification, diagnosis, and treatment. Your caregiver may decide to recommend you to a headache specialist,  depending upon your symptoms or other physical conditions. Having diabetes, allergies, etc., may require a more comprehensive and inclusive approach to your headache. The [employer] will provide, upon request, a list of NHF physician members in your state.   Document Released: 03/31/2003 Document Re-Released: 06/28/2009   [employer]® Patient Information ©2011 [employer].

## 2010-09-15 NOTE — Telephone Encounter (Signed)
OK to send Phentermine 15mg  po daily, #30, no refill til seen and I can recheck vitals

## 2010-09-15 NOTE — Telephone Encounter (Signed)
Patient called and stated that MD and her discussed starting Phentermine 15mg  but she didn't see it on her med list. Pt would like this sent into [facility] on [address]? Please advise?

## 2010-09-19 DIAGNOSIS — C44320 Squamous cell carcinoma of skin of unspecified parts of face: Secondary | ICD-10-CM | POA: Insufficient documentation

## 2010-09-19 DIAGNOSIS — H6692 Otitis media, unspecified, left ear: Secondary | ICD-10-CM | POA: Insufficient documentation

## 2010-09-19 NOTE — Assessment & Plan Note (Signed)
Patient has followed with Urology, Dr Jacquelyne Balint in the past and been diagnosed with Overactive Bladder, has tried numerous medications in the past and is presently on Toviaz 8mg  without any great improvement. Patient has not tried Vesicare yet. Will try 10 mg daily and see if this is helpful to patient. Encouraged to returnt o urology if symptoms worsen or to notify our office.

## 2010-09-19 NOTE — Assessment & Plan Note (Signed)
Has healed well. 

## 2010-09-19 NOTE — Assessment & Plan Note (Signed)
Improved will continue to monitor 

## 2010-09-19 NOTE — Assessment & Plan Note (Signed)
Patient notes recent increase in gaseousness and loose stool. May continue on Pantoprazole and is asked to avoid spicy and fatty foods, start a probiotic and a fiber supplement. Increase fluids and activity level

## 2010-09-19 NOTE — Progress Notes (Signed)
[name] [id number] Feb 22, 1945 09/19/2010      Progress Note-Follow Up  Subjective  Chief Complaint  Chief Complaint  Patient presents with  . Knee Pain    right knee X off and on for several months  . discuss medication  . Arm Pain    left hand and right arm pain  . Sinus Problem    drainage X 3 weeks    HPI  He is a 65 year old Caucasian female who is in today for followup. Has multiple complaints. In the last 2 days she's developed increased headache syndrome intermittent migraine symptoms with photophobia and phonophobia. Facial pressure, malaise and postnasal drip have also developed. Since she was last seen she had a squamous cell carcinoma removed from her left cheek by Dr. Danella Deis she is healed well. She is continuing to struggle with ongoing myalgias knee pain and malaise from her fibromyalgia but finds this tolerable. She notes last few weeks she's been struggling with more neuropathic symptoms in her hands. Her right is worse than her left and she describes it as a hot prickly sensation in her hands. It is worse with overuse and prolonged standing and improves with rest. Her urinary symptoms continued to plague her. She has overactive bladder has tried multiple medications is presently into the S. and does not feel that they're helping her. No dysuria or hematuria. No fevers, chills, chest pain, palpitations, shortness or breath, GI or GU complaints as noted at today's visit otherwise. She has used Advil prn for the HA for some temporary relief  Past Medical History  Diagnosis Date  . Obesity   . Esophageal reflux   . Fibromyalgia   . Asthma   . Allergy     rhinitis  . Sleep apnea, obstructive     Past Surgical History  Procedure Date  . Bariatric surgery   . Lumbar disc surgery   . Bladder suspension     complicated by bowel nick/ clolostomy  . Gallbladder surgery 1978  . Appendectomy 1962  . Gastric bypass 2003    Patient also noted "gastric bypass  resection - 2004"  . Cervical disc surgery     C-7 in 2004, C-4 and C-5 in 2010  . Colostomy bag     Confirm with patient. Listed under medical conditions on form dated 07/18/09.  [name]     Per medical history form dated 07/18/09.  [name] Hernia repair 2009    Two hernias and abdominal reconstruction    Family History  Problem Relation Age of Onset  . Alcohol abuse Mother   . Other Mother     accidental med overdose  . Emphysema Mother     smoked  . Bipolar disorder Mother   . Other Father     CHF  . Diabetes Father   . Cancer Father     throat ca/ smoked  . Alcohol abuse Father   . Stroke Father   . Hypertension Brother   . Hyperlipidemia Brother   . Obesity Brother   . Heart attack Maternal Grandfather   . Heart attack Paternal Grandmother   . Heart attack Paternal Grandfather   . Arthritis Son     psoriatic  . Arthritis Son     psoriatic  . Obesity Son     History   Social History  . Marital Status: Married    Spouse Name: N/A    Number of Children: N/A  . Years of Education: N/A   Occupational History  .  Not on file.   Social History Main Topics  . Smoking status: Never Smoker   . Smokeless tobacco: Never Used  . Alcohol Use: No     special occasions  . Drug Use: No  . Sexually Active: Yes   Other Topics Concern  . Not on file   Social History Narrative  . No narrative on file    Current Outpatient Prescriptions on File Prior to Visit  Medication Sig Dispense Refill  . ALPRAZolam (XANAX) 0.25 MG tablet Take 0.25 mg by mouth daily. Confirm dosage amount of 0.5 indicated on medical history form dated 07/18/09.      . bifidobacterium infantis (ALIGN) capsule Take 1 capsule by mouth daily. Confirm patient still taking. Not on history form dated 07/18/09.      . calcitRIOL (ROCALTROL) 0.5 MCG capsule Take 0.5 mcg by mouth daily. Confirm patient still taking. Not on history form dated 07/18/09.       . clonazePAM (KLONOPIN) 1 MG tablet Take 1 mg by  mouth at bedtime. Confirm dosage, not indicated on medical history form dated 07/18/09.      . DULoxetine (CYMBALTA) 60 MG capsule Take 60 mg by mouth daily. Confirm patient still taking. Not on history form dated 07/18/09.       [name] Fluticasone-Salmeterol (ADVAIR DISKUS) 250-50 MCG/DOSE AEPB Inhale 1 puff into the lungs 2 (two) times daily. Confirm patient still taking. Not on history form dated 07/18/09.       [name] levocetirizine (XYZAL) 5 MG tablet take 1 tablet by mouth once daily if needed  30 tablet  0  . NON FORMULARY once a week. Allergy vaccine 1:10 G.O. Confirm patient still taking. Not on history form dated 07/18/09.       . NON FORMULARY CPAP- set on [employer]. Confirm patient still taking. Not on history form dated 07/18/09.       [name] Olopatadine HCl (PATANASE) 0.6 % SOLN Place 2 sprays into the nose daily. Confirm patient still taking. Not on history form dated 07/18/09.       [name] SINGULAIR 10 MG tablet take 1 tablet by mouth once daily  90 tablet  4  . traZODone (DESYREL) 50 MG tablet Take 50 mg by mouth at bedtime. Take 1/2 tab. Confirm patient still taking. Not on history form dated 07/18/09.       . TUBERCULIN SYR 1CC/27GX1/2" (B-D TB SYRINGE 1CC/27GX1/2") 27G X 1/2" 1 ML MISC by Does not apply route as directed. Confirm patient still taking. Not on history form dated 07/18/09.         Allergies  Allergen Reactions  . Cephalexin   . Codeine   . Hydrocodone     Ask patient severity and reaction. Not indicated on medical history dated 07/18/09.  [name] Keflex     Ask patient severity and reaction. Not indicated on medical history dated 07/18/09.  . Levofloxacin   . Meperidine Hcl   . Morphine And Related     Ask patient severity and reaction. Not indicated on medical history dated 07/18/09.  [name] Oxycodone-Acetaminophen   . Penicillins   . Sulfonamide Derivatives   . Vicodin (Hydrocodone-Acetaminophen)     Ask patient severity and reaction. Not indicated on medical history dated 07/18/09.     Review of Systems  Review of Systems  Constitutional: Negative for fever, chills and malaise/fatigue.  HENT: Positive for congestion. Negative for ear pain and nosebleeds.        PND, sinus pressure noted  Eyes:  Negative for discharge.       Is scheduled for cataract surgery tomorrow.  Respiratory: Negative for shortness of breath.   Cardiovascular: Negative for chest pain, palpitations and leg swelling.  Gastrointestinal: Negative for nausea, abdominal pain and diarrhea.  Genitourinary: Negative for dysuria.  Musculoskeletal: Positive for myalgias, back pain and joint pain. Negative for falls.  Skin: Negative for rash.  Neurological: Negative for loss of consciousness and headaches.  Endo/Heme/Allergies: Negative for polydipsia.  Psychiatric/Behavioral: Negative for depression and suicidal ideas. The patient is not nervous/anxious and does not have insomnia.     Objective  BP 118/78  Pulse 68  Temp(Src) 98.3 F (36.8 C) (Oral)  Ht 4\' 11"  (1.499 m)  Wt 212 lb 12.8 oz (96.525 kg)  BMI 42.98 kg/m2  SpO2 97%  Physical Exam  Physical Exam  Constitutional: She is oriented to person, place, and time and well-developed, well-nourished, and in no distress. No distress.  HENT:  Head: Normocephalic and atraumatic.  Right Ear: External ear normal.  Left Ear: External ear normal.       Nasal mucosa boggy and erythematous b/l  Eyes: Conjunctivae are normal. Right eye exhibits no discharge. Left eye exhibits no discharge. No scleral icterus.  Neck: Neck supple. No thyromegaly present.  Cardiovascular: Normal rate, regular rhythm and normal heart sounds.   Pulmonary/Chest: Effort normal and breath sounds normal. She has no wheezes.  Abdominal: She exhibits no distension and no mass.  Musculoskeletal: She exhibits no edema.  Lymphadenopathy:    She has no cervical adenopathy.  Neurological: She is alert and oriented to person, place, and time.  Skin: Skin is warm and dry. No  rash noted. She is not diaphoretic.  Psychiatric: Memory, affect and judgment normal.    Lab Results  Component Value Date   TSH 0.571 11/07/2009   Lab Results  Component Value Date   WBC 5.6 10/28/2009   HGB 12.5 07/17/2010   HCT 38.4 07/17/2010   MCV 90.1 07/17/2010   PLT 263 07/17/2010   Lab Results  Component Value Date   CREATININE 0.77 07/17/2010   BUN 14 07/17/2010   NA 141 07/17/2010   K 3.5 07/17/2010   CL 102 07/17/2010   CO2 30 07/17/2010   Lab Results  Component Value Date   ALT 13 07/17/2010   AST 16 07/17/2010   ALKPHOS 110 07/17/2010   BILITOT 0.3 07/17/2010     Assessment & Plan  UNSPECIFIED URINARY INCONTINENCE Patient has followed with Urology, Dr Jacquelyne Balint in the past and been diagnosed with Overactive Bladder, has tried numerous medications in the past and is presently on Toviaz 8mg  without any great improvement. Patient has not tried Vesicare yet. Will try 10 mg daily and see if this is helpful to patient. Encouraged to returnt o urology if symptoms worsen or to notify our office.  ESSENTIAL HYPERTENSION, BENIGN Well controlled on current meds, no change in therapy at today's visit.  SCC (squamous cell carcinoma), face Has healed well  ESOPHAGEAL REFLUX Patient notes recent increase in gaseousness and loose stool. May continue on Pantoprazole and is asked to avoid spicy and fatty foods, start a probiotic and a fiber supplement. Increase fluids and activity level  FIBROMYALGIA Patient noting an increase in right knee pain as well as neuropathic pain, tingling in b/l hands over past 3 weeks, worse with use, patient will consider referral to neurosurg if symptoms worse for further evaluation  HYPERLIPIDEMIA Patient would like to avoid prescription meds for this. She  agrees to avoid trans fats, minimize saturated fats, stay active and continue fish oil and fiber. Repeat lipids with next blood draw  HYPOCALCEMIA Improved will continue to monitor

## 2010-09-19 NOTE — Assessment & Plan Note (Signed)
Patient would like to avoid prescription meds for this. She agrees to avoid trans fats, minimize saturated fats, stay active and continue fish oil and fiber. Repeat lipids with next blood draw

## 2010-09-19 NOTE — Assessment & Plan Note (Signed)
Well controlled on current meds, no change in therapy at today's visit.

## 2010-09-19 NOTE — Assessment & Plan Note (Signed)
Patient noting an increase in right knee pain as well as neuropathic pain, tingling in b/l hands over past 3 weeks, worse with use, patient will consider referral to neurosurg if symptoms worse for further evaluation

## 2010-09-21 ENCOUNTER — Ambulatory Visit (INDEPENDENT_AMBULATORY_CARE_PROVIDER_SITE_OTHER): Payer: BC Managed Care – PPO | Admitting: Internal Medicine

## 2010-09-21 ENCOUNTER — Encounter: Payer: Self-pay | Admitting: Internal Medicine

## 2010-09-21 VITALS — BP 116/70 | HR 54 | Ht 59.0 in | Wt 212.6 lb

## 2010-09-21 DIAGNOSIS — H1010 Acute atopic conjunctivitis, unspecified eye: Secondary | ICD-10-CM

## 2010-09-21 DIAGNOSIS — H1045 Other chronic allergic conjunctivitis: Secondary | ICD-10-CM

## 2010-09-21 DIAGNOSIS — J309 Allergic rhinitis, unspecified: Secondary | ICD-10-CM

## 2010-09-21 DIAGNOSIS — G4733 Obstructive sleep apnea (adult) (pediatric): Secondary | ICD-10-CM

## 2010-09-21 MED ORDER — LEVOCETIRIZINE DIHYDROCHLORIDE 5 MG PO TABS: 5.0000 mg | ORAL_TABLET | Freq: Every evening | ORAL | Status: DC

## 2010-09-21 MED ORDER — METHYLPREDNISOLONE ACETATE 80 MG/ML IJ SUSP
80.0000 mg | Freq: Once | INTRAMUSCULAR | Status: AC
  Administered 2010-09-21: 80 mg via INTRAMUSCULAR

## 2010-09-21 MED ORDER — MONTELUKAST SODIUM 10 MG PO TABS: 10.0000 mg | ORAL_TABLET | Freq: Every day | ORAL | Status: DC

## 2010-09-21 NOTE — Patient Instructions (Signed)
Order- depo 80  Try otc eye ointment - Lacrilube - uses as needed  Scripts for Singulair and Xyzal-      You can try taking off and on a week at a time to see what is helping or not.

## 2010-09-21 NOTE — Progress Notes (Signed)
Subjective:    Patient ID: [name], female    DOB: 04-08-1945, 65 y.o.   MRN: [id number]  HPI 09/21/10- 65 year old female never smoker followed for allergic rhinitis, asthma, OSA, complicated by GERD, DM, HBP, obesity. Last here August 08, 2009 CPAP 12- Able to wear it all night, comfortable now. . Colostomy (colon nicked in a bladder sling procedure) was reversed. Had to revise the wound for staph and incidentally had a squamous cell skin cancer removed. Had iron infusion for anemia. No major respiratory problems through the past year. For the past 2-3 weeks has had burning and tearing of eyes with some postnasal drip, raspy throat.  Daily xyzal, patanase, singulair and ipratropium eye nasal spray..  Not aware of drafts into eye. Tried optivar, visine. Denies glaucoma. Review of Systems Constitutional:   No-   weight loss, night sweats, fevers, chills, fatigue, lassitude. HEENT:   No-  headaches, difficulty swallowing, tooth/dental problems, sore throat,       No-  sneezing, itching, ear ache,          + nasal congestion, post nasal drip,  CV:  No-   chest pain, orthopnea, PND, swelling in lower extremities, anasarca, dizziness, palpitations Resp: No-   shortness of breath with exertion or at rest.              No-   productive cough,  No non-productive cough,  No-  coughing up of blood.              No-   change in color of mucus.  No- wheezing.   Skin: No-   rash or lesions. GI:  No-   heartburn, indigestion, abdominal pain, nausea, vomiting, diarrhea,                 change in bowel habits, loss of appetite GU: No-   dysuria, change in color of urine, no urgency or frequency.  No- flank pain. MS:  No-   joint pain or swelling.  No- decreased range of motion.  No- back pain. Neuro- grossly normal to observation, Or:  Psych:  No- change in mood or affect. No depression or anxiety.  No memory loss.      Objective:   Physical Exam General- Alert, Oriented, Affect-appropriate,  Distress- none acute  Very overweight Skin- rash-none, lesions- none, excoriation- none Lymphadenopathy- none Head- atraumatic            Eyes- Gross vision intact, PERRLA, conjunctivae clear, with watery thin secretions            Ears- Hearing, canals normal            Nose- Clear, No- Septal dev, mucus, polyps, erosion, perforation, +sniffing            Throat- Mallampati II , mucosa clear , drainage- none, tonsils- atrophic Neck- flexible , trachea midline, no stridor , thyroid nl, carotid no bruit Chest - symmetrical excursion , unlabored           Heart/CV- RRR , no murmur , no gallop  , no rub, nl s1 s2                           - JVD- none , edema- none, stasis changes- none, varices- right calf           Lung- clear to P&A, wheeze- none, cough- none , dullness-none, rub- none  Chest wall-  Abd- tender-no, distended-no, bowel sounds-present, HSM- no Br/ Gen/ Rectal- Not done, not indicated Extrem- cyanosis- none, clubbing, none, atrophy- none, strength- nl Neuro- grossly intact to observation         Assessment & Plan:

## 2010-09-21 NOTE — Assessment & Plan Note (Addendum)
With recent onset at this time of year, this may be an allergy. We will consider reassessment of her shots in the future as apporopriate. Plan -lacrilube ointment -depo 80

## 2010-09-25 ENCOUNTER — Encounter: Payer: Self-pay | Admitting: Internal Medicine

## 2010-09-25 NOTE — Assessment & Plan Note (Signed)
Allergy vaccine has significantly helped control her rhinitis.

## 2010-09-25 NOTE — Assessment & Plan Note (Signed)
She is now using CPAP 12 CWP every night with good compliance and control. She has not been successful at weight control. Sleep hygiene is appropriate

## 2010-10-10 ENCOUNTER — Encounter: Payer: Self-pay | Admitting: Family Medicine

## 2010-10-10 ENCOUNTER — Ambulatory Visit (INDEPENDENT_AMBULATORY_CARE_PROVIDER_SITE_OTHER): Payer: BC Managed Care – PPO | Admitting: Family Medicine

## 2010-10-10 DIAGNOSIS — J029 Acute pharyngitis, unspecified: Secondary | ICD-10-CM

## 2010-10-10 DIAGNOSIS — I10 Essential (primary) hypertension: Secondary | ICD-10-CM

## 2010-10-10 NOTE — Assessment & Plan Note (Signed)
Well controlled despite acute illness, no changes 

## 2010-10-10 NOTE — Progress Notes (Signed)
[name] [id number] 1945/09/03 10/10/2010      Progress Note-Follow Up  Subjective  Chief Complaint  Chief Complaint  Patient presents with  . Sore Throat    hurts to swallow X 4 days  . Otalgia    right ear X 4 days    HPI  This is a 65 year old Caucasian female in today for evaluation of her day history of sore throat, fevers, congestion productive of yellow phlegm , right ear pain, malaise, and cough. She's had multiple family members become ill prior to her and require antibiotics. No chest pain, palpitations, left ear pain, shortness of breath, GI or GU complaints noted. She notes her right hand and right knee pain which were bothering her her last visit are greatly improved. She ended up getting a cortisone injection recently and that helped her pain at this point she does not require further referral for evaluation.  Past Medical History  Diagnosis Date  . Obesity   . Esophageal reflux   . Fibromyalgia   . Asthma   . Allergy     rhinitis  . Sleep apnea, obstructive     Past Surgical History  Procedure Date  . Bariatric surgery   . Lumbar disc surgery   . Bladder suspension     complicated by bowel nick/ clolostomy  . Gallbladder surgery 1978  . Appendectomy 1962  . Gastric bypass 2003    Patient also noted "gastric bypass resection - 2004"  . Cervical disc surgery     C-7 in 2004, C-4 and C-5 in 2010  . Colostomy bag     Confirm with patient. Listed under medical conditions on form dated 07/18/09.  [name]     Per medical history form dated 07/18/09.  [name] Hernia repair 2009    Two hernias and abdominal reconstruction  . Ivc filter     prophyllactically- no hx DVT    Family History  Problem Relation Age of Onset  . Alcohol abuse Mother   . Other Mother     accidental med overdose  . Emphysema Mother     smoked  . Bipolar disorder Mother   . Other Father     CHF  . Diabetes Father   . Cancer Father     throat ca/ smoked  . Alcohol abuse  Father   . Stroke Father   . Hypertension Brother   . Hyperlipidemia Brother   . Obesity Brother   . Heart attack Maternal Grandfather   . Heart attack Paternal Grandmother   . Heart attack Paternal Grandfather   . Arthritis Son     psoriatic  . Arthritis Son     psoriatic  . Obesity Son     History   Social History  . Marital Status: Married    Spouse Name: N/A    Number of Children: N/A  . Years of Education: N/A   Occupational History  . Not on file.   Social History Main Topics  . Smoking status: Never Smoker   . Smokeless tobacco: Never Used  . Alcohol Use: No     special occasions  . Drug Use: No  . Sexually Active: Yes   Other Topics Concern  . Not on file   Social History Narrative  . No narrative on file    Current Outpatient Prescriptions on File Prior to Visit  Medication Sig Dispense Refill  . ACETAMINOPHEN-BUTALBITAL (BUPAP) 50-650 MG TABS Take 1 tablet by mouth every 8 (eight) hours as  needed. For HA  3 each  0  . ALPRAZolam (XANAX) 0.25 MG tablet Take 0.25 mg by mouth daily. Confirm dosage amount of 0.5 indicated on medical history form dated 07/18/09.      . bifidobacterium infantis (ALIGN) capsule Take 1 capsule by mouth daily. Confirm patient still taking. Not on history form dated 07/18/09.      . calcitRIOL (ROCALTROL) 0.5 MCG capsule Take 0.5 mcg by mouth daily. Confirm patient still taking. Not on history form dated 07/18/09.       . clonazePAM (KLONOPIN) 1 MG tablet Take 1 mg by mouth at bedtime. Confirm dosage, not indicated on medical history form dated 07/18/09.      . DULoxetine (CYMBALTA) 60 MG capsule Take 60 mg by mouth daily. Confirm patient still taking. Not on history form dated 07/18/09.       [name] Fluticasone-Salmeterol (ADVAIR DISKUS) 250-50 MCG/DOSE AEPB Inhale 1 puff into the lungs 2 (two) times daily. Confirm patient still taking. Not on history form dated 07/18/09.       . folic acid (FOLVITE) 1 MG tablet Take 1 tablet by mouth  Daily.      [name] ipratropium (ATROVENT) 0.06 % nasal spray Place 2 sprays into the nose 2 (two) times daily.        [name] levocetirizine (XYZAL) 5 MG tablet Take 1 tablet (5 mg total) by mouth every evening. As needed -antihistamine  30 tablet  prn  . montelukast (SINGULAIR) 10 MG tablet Take 1 tablet (10 mg total) by mouth at bedtime.  30 tablet  prn  . NON FORMULARY once a week. Allergy vaccine 1:10 G.O. Confirm patient still taking. Not on history form dated 07/18/09.       . NON FORMULARY CPAP- set on [employer]. Confirm patient still taking. Not on history form dated 07/18/09.       [name] Olopatadine HCl (PATANASE) 0.6 % SOLN Place 2 sprays into the nose daily. Confirm patient still taking. Not on history form dated 07/18/09.       [name] phentermine 15 MG capsule Take 1 capsule (15 mg total) by mouth every morning.  30 capsule  0  . promethazine (PHENERGAN) 25 MG tablet Take 1 tablet (25 mg total) by mouth every 8 (eight) hours as needed for nausea.  30 tablet  0  . ranitidine (ZANTAC) 150 MG capsule 1 tab po bid prn reflux alternate with pantoprazole  60 capsule  3  . solifenacin (VESICARE) 10 MG tablet Take 1 tablet (10 mg total) by mouth daily.  30 tablet  2  . traZODone (DESYREL) 50 MG tablet Take 50 mg by mouth at bedtime. Take 1/2 tab. Confirm patient still taking. Not on history form dated 07/18/09.       . TUBERCULIN SYR 1CC/27GX1/2" (B-D TB SYRINGE 1CC/27GX1/2") 27G X 1/2" 1 ML MISC by Does not apply route as directed. Confirm patient still taking. Not on history form dated 07/18/09.         Allergies  Allergen Reactions  . Cephalexin   . Codeine   . Hydrocodone     Ask patient severity and reaction. Not indicated on medical history dated 07/18/09.  [name] Keflex     Ask patient severity and reaction. Not indicated on medical history dated 07/18/09.  . Levofloxacin   . Meperidine Hcl   . Morphine And Related     Ask patient severity and reaction. Not indicated on medical history dated 07/18/09.  [name]  Oxycodone-Acetaminophen   .  Penicillins   . Sulfonamide Derivatives   . Vicodin (Hydrocodone-Acetaminophen)     Ask patient severity and reaction. Not indicated on medical history dated 07/18/09.    Review of Systems  Review of Systems  Constitutional: Positive for fever. Negative for chills and malaise/fatigue.  HENT: Positive for ear pain, congestion and sore throat.   Eyes: Negative for discharge.  Respiratory: Positive for cough. Negative for shortness of breath and wheezing.   Cardiovascular: Negative for chest pain, palpitations, claudication and leg swelling.  Gastrointestinal: Negative for nausea, abdominal pain and diarrhea.  Genitourinary: Negative for dysuria.  Musculoskeletal: Negative for falls.  Skin: Negative for rash.  Neurological: Positive for headaches. Negative for loss of consciousness.  Endo/Heme/Allergies: Negative for polydipsia.  Psychiatric/Behavioral: Negative for depression and suicidal ideas. The patient is not nervous/anxious and does not have insomnia.     Objective  BP 114/69  Pulse 65  Temp(Src) 98.1 F (36.7 C) (Oral)  Ht 4\' 11"  (1.499 m)  Wt 206 lb 6.4 oz (93.622 kg)  BMI 41.69 kg/m2  SpO2 94%  Physical Exam  Physical Exam  Constitutional: She is oriented to person, place, and time and well-developed, well-nourished, and in no distress. No distress.  HENT:  Head: Normocephalic and atraumatic.  Right Ear: External ear normal.  Left Ear: External ear normal.       Erythema posterior oropharynx  Eyes: Conjunctivae are normal.  Neck: Neck supple. No thyromegaly present.  Cardiovascular: Normal rate, regular rhythm and normal heart sounds.   Pulmonary/Chest: Effort normal and breath sounds normal. She has no wheezes.  Abdominal: She exhibits no distension and no mass.  Musculoskeletal: She exhibits no edema.  Lymphadenopathy:    She has cervical adenopathy.  Neurological: She is alert and oriented to person, place, and time.  Skin:  Skin is warm and dry. No rash noted. She is not diaphoretic.  Psychiatric: Memory, affect and judgment normal.    Lab Results  Component Value Date   TSH 0.571 11/07/2009   Lab Results  Component Value Date   WBC 5.6 10/28/2009   HGB 12.5 07/17/2010   HCT 38.4 07/17/2010   MCV 90.1 07/17/2010   PLT 263 07/17/2010   Lab Results  Component Value Date   CREATININE 0.77 07/17/2010   BUN 14 07/17/2010   NA 141 07/17/2010   K 3.5 07/17/2010   CL 102 07/17/2010   CO2 30 07/17/2010   Lab Results  Component Value Date   ALT 13 07/17/2010   AST 16 07/17/2010   ALKPHOS 110 07/17/2010   BILITOT 0.3 07/17/2010     Assessment & Plan  ESSENTIAL HYPERTENSION, BENIGN Well controlled despite acute illness, no changes  Pharyngitis Patient for the last 4 days with sore throat, congestion right ear pain. Will treat with azithromycin 250 mg tabs 2 tabs by mouth once and then 1 tab by mouth daily x4 days. She will also start Mucinex twice a day, push clear fluids and call if symptoms worsen.

## 2010-10-10 NOTE — Assessment & Plan Note (Signed)
Patient for the last 4 days with sore throat, congestion right ear pain. Will treat with azithromycin 250 mg tabs 2 tabs by mouth once and then 1 tab by mouth daily x4 days. She will also start Mucinex twice a day, push clear fluids and call if symptoms worsen.

## 2010-10-11 MED ORDER — AZITHROMYCIN 250 MG PO TABS: ORAL_TABLET | ORAL | Status: DC

## 2010-10-11 NOTE — Progress Notes (Signed)
Addended by: Court Joy on: 10/11/2010 10:04 AM   Modules accepted: Orders

## 2010-10-16 ENCOUNTER — Ambulatory Visit: Payer: BC Managed Care – PPO | Admitting: Family Medicine

## 2010-10-24 ENCOUNTER — Ambulatory Visit (INDEPENDENT_AMBULATORY_CARE_PROVIDER_SITE_OTHER): Payer: BC Managed Care – PPO | Admitting: Family Medicine

## 2010-10-24 ENCOUNTER — Encounter: Payer: Self-pay | Admitting: Family Medicine

## 2010-10-24 VITALS — BP 104/69 | HR 70 | Temp 98.9°F | Ht 59.0 in | Wt 203.0 lb

## 2010-10-24 DIAGNOSIS — J209 Acute bronchitis, unspecified: Secondary | ICD-10-CM

## 2010-10-24 DIAGNOSIS — E669 Obesity, unspecified: Secondary | ICD-10-CM

## 2010-10-24 DIAGNOSIS — E119 Type 2 diabetes mellitus without complications: Secondary | ICD-10-CM

## 2010-10-24 DIAGNOSIS — Z23 Encounter for immunization: Secondary | ICD-10-CM

## 2010-10-24 DIAGNOSIS — I10 Essential (primary) hypertension: Secondary | ICD-10-CM

## 2010-10-24 LAB — BASIC METABOLIC PANEL
BUN: 12 mg/dL (ref 6–23)
CO2: 26 mEq/L (ref 19–32)
Glucose, Bld: 84 mg/dL (ref 70–99)
Potassium: 3.7 mEq/L (ref 3.5–5.1)
Sodium: 142 mEq/L (ref 135–145)

## 2010-10-24 LAB — PROTIME-INR: INR: 1.1 (ref 0.00–1.49)

## 2010-10-24 LAB — APTT: aPTT: 33 seconds (ref 24–37)

## 2010-10-24 LAB — CBC
HCT: 36.6 % (ref 36.0–46.0)
Hemoglobin: 12.6 g/dL (ref 12.0–15.0)
MCHC: 34.3 g/dL (ref 30.0–36.0)
RBC: 4.02 MIL/uL (ref 3.87–5.11)
RDW: 13.9 % (ref 11.5–15.5)

## 2010-10-24 LAB — ABO/RH: ABO/RH(D): O POS

## 2010-10-24 LAB — TYPE AND SCREEN: Antibody Screen: NEGATIVE

## 2010-10-24 MED ORDER — PHENTERMINE HCL 37.5 MG PO CAPS: 37.5000 mg | ORAL_CAPSULE | ORAL | Status: DC

## 2010-10-24 MED ORDER — DOXYCYCLINE HYCLATE 100 MG PO TABS: 100.0000 mg | ORAL_TABLET | Freq: Two times a day (BID) | ORAL | Status: AC

## 2010-10-24 NOTE — Patient Instructions (Signed)
Bronchitis Bronchitis is the body's way of reacting to injury and/or infection (inflammation) of the bronchi. Bronchi are the air tubes that extend from the windpipe into the lungs. If the inflammation becomes severe, it may cause shortness of breath.  CAUSES Inflammation may be caused by:  A virus.   Germs (bacteria).   Dust.   Allergens.   Pollutants and many other irritants.  The cells lining the bronchial tree are covered with tiny hairs (cilia). These constantly beat upward, away from the lungs, toward the mouth. This keeps the lungs free of pollutants. When these cells become too irritated and are unable to do their job, mucus begins to develop. This causes the characteristic cough of bronchitis. The cough clears the lungs when the cilia are unable to do their job. Without either of these protective mechanisms, the mucus would settle in the lungs. Then you would develop pneumonia. Smoking is a common cause of bronchitis and can contribute to pneumonia. Stopping this habit is the single most important thing you can do to help yourself. TREATMENT  Your caregiver may prescribe an antibiotic if the cough is caused by bacteria. Also, medicines that open up your airways make it easier to breathe. Your caregiver may also recommend or prescribe an expectorant. It will loosen the mucus to be coughed up. Only take over-the-counter or prescription medicines for pain, discomfort, or fever as directed by your caregiver.   Removing whatever causes the problem (smoking, for example) is critical to preventing the problem from getting worse.   Cough suppressants may be prescribed for relief of cough symptoms.   Inhaled medicines may be prescribed to help with symptoms now and to help prevent problems from returning.   For those with recurrent (chronic) bronchitis, there may be a need for steroid medicines.  SEEK IMMEDIATE MEDICAL CARE IF:  During treatment, you develop more pus-like mucus  (purulent sputum).   You or your child has an oral temperature above 102, not controlled by medicine.   Your baby is older than 3 months with a rectal temperature of 102 F (38.9 C) or higher.   Your baby is 3 months old or younger with a rectal temperature of 100.4 F (38 C) or higher.   You become progressively more ill.   You have increased difficulty breathing, wheezing, or shortness of breath.  It is necessary to seek immediate medical care if you are elderly or sick from any other disease. MAKE SURE YOU:  Understand these instructions.   Will watch your condition.   Will get help right away if you are not doing well or get worse.  Document Released: 01/08/2005 Document Re-Released: 04/04/2009 [employer] Patient Information 2011 [employer]. 

## 2010-10-31 ENCOUNTER — Telehealth: Payer: Self-pay | Admitting: Family Medicine

## 2010-10-31 ENCOUNTER — Encounter: Payer: Self-pay | Admitting: Family Medicine

## 2010-10-31 ENCOUNTER — Other Ambulatory Visit: Payer: Self-pay | Admitting: Internal Medicine

## 2010-10-31 DIAGNOSIS — J209 Acute bronchitis, unspecified: Secondary | ICD-10-CM | POA: Insufficient documentation

## 2010-10-31 MED ORDER — BENZONATATE 100 MG PO CAPS: 100.0000 mg | ORAL_CAPSULE | Freq: Two times a day (BID) | ORAL | Status: DC | PRN

## 2010-10-31 NOTE — Telephone Encounter (Signed)
Please advise 

## 2010-10-31 NOTE — Assessment & Plan Note (Signed)
No flare in numbers with acute illness

## 2010-10-31 NOTE — Telephone Encounter (Signed)
Pt informed and would like the Tessalon sen to pharmacy. Pt states her cough is what is worse.

## 2010-10-31 NOTE — Assessment & Plan Note (Signed)
Well controlled despite acute illness,no change in medications

## 2010-10-31 NOTE — Telephone Encounter (Signed)
If she is worse she needs to come back in for reeval, if not finish the course of Doxy and increase fluid intake, make sure she is taking an antihistamine such as Zyrtec daily and can add Tessalon Perles 100mg  caps, 1 cap po bid prn cough, disp #20

## 2010-10-31 NOTE — Telephone Encounter (Signed)
Sent to me accidentally--PM

## 2010-10-31 NOTE — Assessment & Plan Note (Signed)
Hs been sick for several weeks and has already had a courseof Azithromycin, her fevers, chills have resolved but her cough, productive of yellow phlegm is worsening, will try a course of Doxycycline and increase fluids.

## 2010-10-31 NOTE — Progress Notes (Signed)
[name] [id number] Jun 19, 1945 10/31/2010      Progress Note-Follow Up  Subjective  Chief Complaint  Chief Complaint  Patient presents with  . Follow-up    on phentermine  . Cough    X 3 days, w/phlegm (yellow)    HPI  Patient is a 65 year old Caucasian female who is in today for evaluation of cough. She's been sick for couple weeks now. She went to urgent care or a DO NOT [facility]USCITATE he had a course of azithromycin. Did have fevers and chills have resolved. Unfortunately her cough is worsening and is productive of yellow phlegm. She has a scratchy throat, malaise. She denies any ear pain headaches, chest pain, GI symptoms. She does feel she was slightly better while on the Zithromax now her symptoms have worsened once again.  Past Medical History  Diagnosis Date  . Obesity   . Esophageal reflux   . Fibromyalgia   . Asthma   . Allergy     rhinitis  . Sleep apnea, obstructive   . Bronchitis, acute 10/31/2010    Past Surgical History  Procedure Date  . Bariatric surgery   . Lumbar disc surgery   . Bladder suspension     complicated by bowel nick/ clolostomy  . Gallbladder surgery 1978  . Appendectomy 1962  . Gastric bypass 2003    Patient also noted "gastric bypass resection - 2004"  . Cervical disc surgery     C-7 in 2004, C-4 and C-5 in 2010  . Colostomy bag     Confirm with patient. Listed under medical conditions on form dated 07/18/09.  [name]     Per medical history form dated 07/18/09.  [name] Hernia repair 2009    Two hernias and abdominal reconstruction  . Ivc filter     prophyllactically- no hx DVT    Family History  Problem Relation Age of Onset  . Alcohol abuse Mother   . Other Mother     accidental med overdose  . Emphysema Mother     smoked  . Bipolar disorder Mother   . Other Father     CHF  . Diabetes Father   . Cancer Father     throat ca/ smoked  . Alcohol abuse Father   . Stroke Father   . Hypertension Brother   .  Hyperlipidemia Brother   . Obesity Brother   . Heart attack Maternal Grandfather   . Heart attack Paternal Grandmother   . Heart attack Paternal Grandfather   . Arthritis Son     psoriatic  . Arthritis Son     psoriatic  . Obesity Son     History   Social History  . Marital Status: Married    Spouse Name: N/A    Number of Children: N/A  . Years of Education: N/A   Occupational History  . Not on file.   Social History Main Topics  . Smoking status: Never Smoker   . Smokeless tobacco: Never Used  . Alcohol Use: No     special occasions  . Drug Use: No  . Sexually Active: Yes   Other Topics Concern  . Not on file   Social History Narrative  . No narrative on file    Current Outpatient Prescriptions on File Prior to Visit  Medication Sig Dispense Refill  . ACETAMINOPHEN-BUTALBITAL (BUPAP) 50-650 MG TABS Take 1 tablet by mouth every 8 (eight) hours as needed. For HA  3 each  0  . ALPRAZolam Prudy Feeler)  0.25 MG tablet Take 0.25 mg by mouth daily. Confirm dosage amount of 0.5 indicated on medical history form dated 07/18/09.      [name] azithromycin (ZITHROMAX) 250 MG tablet Use as directed  6 each  0  . bifidobacterium infantis (ALIGN) capsule Take 1 capsule by mouth daily. Confirm patient still taking. Not on history form dated 07/18/09.      . calcitRIOL (ROCALTROL) 0.5 MCG capsule Take 0.5 mcg by mouth daily. Confirm patient still taking. Not on history form dated 07/18/09.       . clonazePAM (KLONOPIN) 1 MG tablet Take 1 mg by mouth at bedtime. Confirm dosage, not indicated on medical history form dated 07/18/09.      . DULoxetine (CYMBALTA) 60 MG capsule Take 60 mg by mouth daily. Confirm patient still taking. Not on history form dated 07/18/09.       . folic acid (FOLVITE) 1 MG tablet Take 1 tablet by mouth Daily.      [name] ipratropium (ATROVENT) 0.06 % nasal spray Place 2 sprays into the nose 2 (two) times daily.        [name] levocetirizine (XYZAL) 5 MG tablet Take 1 tablet (5 mg  total) by mouth every evening. As needed -antihistamine  30 tablet  prn  . montelukast (SINGULAIR) 10 MG tablet Take 1 tablet (10 mg total) by mouth at bedtime.  30 tablet  prn  . NON FORMULARY once a week. Allergy vaccine 1:10 G.O. Confirm patient still taking. Not on history form dated 07/18/09.       . NON FORMULARY CPAP- set on [employer]. Confirm patient still taking. Not on history form dated 07/18/09.       [name] Olopatadine HCl (PATANASE) 0.6 % SOLN Place 2 sprays into the nose daily. Confirm patient still taking. Not on history form dated 07/18/09.       [name] pantoprazole (PROTONIX) 40 MG tablet       . promethazine (PHENERGAN) 25 MG tablet Take 1 tablet (25 mg total) by mouth every 8 (eight) hours as needed for nausea.  30 tablet  0  . ranitidine (ZANTAC) 150 MG capsule 1 tab po bid prn reflux alternate with pantoprazole  60 capsule  3  . solifenacin (VESICARE) 10 MG tablet Take 1 tablet (10 mg total) by mouth daily.  30 tablet  2  . traZODone (DESYREL) 50 MG tablet Take 50 mg by mouth at bedtime. Take 1/2 tab. Confirm patient still taking. Not on history form dated 07/18/09.       . TUBERCULIN SYR 1CC/27GX1/2" (B-D TB SYRINGE 1CC/27GX1/2") 27G X 1/2" 1 ML MISC by Does not apply route as directed. Confirm patient still taking. Not on history form dated 07/18/09.         Allergies  Allergen Reactions  . Cephalexin   . Codeine   . Hydrocodone     Ask patient severity and reaction. Not indicated on medical history dated 07/18/09.  [name] Keflex     Ask patient severity and reaction. Not indicated on medical history dated 07/18/09.  . Levofloxacin   . Meperidine Hcl   . Morphine And Related     Ask patient severity and reaction. Not indicated on medical history dated 07/18/09.  [name] Oxycodone-Acetaminophen   . Penicillins   . Sulfonamide Derivatives   . Vicodin (Hydrocodone-Acetaminophen)     Ask patient severity and reaction. Not indicated on medical history dated 07/18/09.    Review of  Systems  Review of Systems  Constitutional:  Negative for fever, chills and malaise/fatigue.  HENT: Negative for congestion.   Eyes: Negative for discharge.  Respiratory: Positive for cough and sputum production. Negative for shortness of breath and wheezing.   Cardiovascular: Negative for chest pain, palpitations and leg swelling.  Gastrointestinal: Negative for nausea, vomiting, abdominal pain and diarrhea.  Genitourinary: Negative for dysuria.  Musculoskeletal: Negative for falls.  Skin: Negative for rash.  Neurological: Negative for loss of consciousness and headaches.  Endo/Heme/Allergies: Negative for polydipsia.  Psychiatric/Behavioral: Negative for depression and suicidal ideas. The patient is not nervous/anxious and does not have insomnia.     Objective  BP 104/69  Pulse 70  Temp(Src) 98.9 F (37.2 C) (Oral)  Ht 4\' 11"  (1.499 m)  Wt 203 lb (92.08 kg)  BMI 41.00 kg/m2  SpO2 94%  Physical Exam  Physical Exam  Constitutional: She is oriented to person, place, and time and well-developed, well-nourished, and in no distress. No distress.  HENT:  Head: Normocephalic and atraumatic.  Eyes: Conjunctivae are normal.  Neck: Neck supple. No thyromegaly present.  Cardiovascular: Normal rate, regular rhythm and normal heart sounds.   No murmur heard. Pulmonary/Chest: Effort normal and breath sounds normal. She has no wheezes.       Decreased breath sounds b/l bases  Abdominal: She exhibits no distension and no mass.  Musculoskeletal: She exhibits no edema.  Lymphadenopathy:    She has no cervical adenopathy.  Neurological: She is alert and oriented to person, place, and time.  Skin: Skin is warm and dry. No rash noted. She is not diaphoretic.  Psychiatric: Memory, affect and judgment normal.    Lab Results  Component Value Date   TSH 0.571 11/07/2009   Lab Results  Component Value Date   WBC 5.6 10/28/2009   HGB 12.5 07/17/2010   HCT 38.4 07/17/2010   MCV 90.1  07/17/2010   PLT 263 07/17/2010   Lab Results  Component Value Date   CREATININE 0.77 07/17/2010   BUN 14 07/17/2010   NA 141 07/17/2010   K 3.5 07/17/2010   CL 102 07/17/2010   CO2 30 07/17/2010   Lab Results  Component Value Date   ALT 13 07/17/2010   AST 16 07/17/2010   ALKPHOS 110 07/17/2010   BILITOT 0.3 07/17/2010      Assessment & Plan  DM No flare in numbers with acute illness  ESSENTIAL HYPERTENSION, BENIGN Well controlled despite acute illness,no change in medications  Bronchitis, acute Hs been sick for several weeks and has already had a courseof Azithromycin, her fevers, chills have resolved but her cough, productive of yellow phlegm is worsening, will try a course of Doxycycline and increase fluids.

## 2010-11-02 LAB — HEMOGLOBIN AND HEMATOCRIT, BLOOD
HCT: 38
Hemoglobin: 12.8

## 2010-11-02 LAB — APTT: aPTT: 34

## 2010-11-02 LAB — PROTIME-INR: INR: 1.1

## 2010-11-02 LAB — TYPE AND SCREEN: ABO/RH(D): O POS

## 2010-11-08 LAB — COMPREHENSIVE METABOLIC PANEL
ALT: 93 — ABNORMAL HIGH
AST: 632 — ABNORMAL HIGH
Albumin: 3.4 — ABNORMAL LOW
Albumin: 3.9
Alkaline Phosphatase: 131 — ABNORMAL HIGH
Calcium: 8.6
Creatinine, Ser: 0.72
GFR calc Af Amer: >60
Potassium: 4
Sodium: 141
Sodium: 147 — ABNORMAL HIGH
Total Protein: 5.7 — ABNORMAL LOW
Total Protein: 6.6

## 2010-11-08 LAB — RAPID URINE DRUG SCREEN, HOSP PERFORMED
Amphetamines: NOT DETECTED
Barbiturates: NOT DETECTED
Benzodiazepines: NOT DETECTED
Opiates: NOT DETECTED

## 2010-11-08 LAB — POCT CARDIAC MARKERS
CKMB, poc: <1 — ABNORMAL LOW
Troponin i, poc: <0.05

## 2010-11-08 LAB — HEPATIC FUNCTION PANEL
ALT: 135 — ABNORMAL HIGH
AST: 138 — ABNORMAL HIGH
AST: 61 — ABNORMAL HIGH
Albumin: 3.1 — ABNORMAL LOW
Alkaline Phosphatase: 145 — ABNORMAL HIGH
Bilirubin, Direct: <0.1
Total Protein: 5.4 — ABNORMAL LOW

## 2010-11-08 LAB — SALICYLATE LEVEL: Salicylate Lvl: <4

## 2010-11-08 LAB — DIFFERENTIAL
Eosinophils Relative: 4
Lymphocytes Relative: 16
Lymphs Abs: 0.8
Monocytes Absolute: 0.5
Monocytes Relative: 9

## 2010-11-08 LAB — FERRITIN: Ferritin: 73 (ref 10–291)

## 2010-11-08 LAB — HEPATITIS B CORE ANTIBODY, TOTAL: Hep B Core Total Ab: NEGATIVE

## 2010-11-08 LAB — HEPATITIS A ANTIBODY, TOTAL: Hep A Total Ab: NEGATIVE

## 2010-11-08 LAB — URINALYSIS, ROUTINE W REFLEX MICROSCOPIC
Bilirubin Urine: NEGATIVE
Nitrite: NEGATIVE
Specific Gravity, Urine: 1.008
Urobilinogen, UA: 0.2

## 2010-11-08 LAB — CBC
MCHC: 33.2
MCV: 92.4
Platelets: 204

## 2010-11-08 LAB — ACETAMINOPHEN LEVEL: Acetaminophen (Tylenol), Serum: <10 — ABNORMAL LOW

## 2010-11-08 LAB — B-NATRIURETIC PEPTIDE (CONVERTED LAB): Pro B Natriuretic peptide (BNP): 155 — ABNORMAL HIGH

## 2010-11-08 LAB — URINE CULTURE

## 2010-11-16 ENCOUNTER — Encounter: Payer: Self-pay | Admitting: Family Medicine

## 2010-11-16 ENCOUNTER — Ambulatory Visit (INDEPENDENT_AMBULATORY_CARE_PROVIDER_SITE_OTHER): Payer: BC Managed Care – PPO | Admitting: Family Medicine

## 2010-11-16 VITALS — BP 127/83 | HR 65 | Temp 98.2°F | Ht 59.0 in | Wt 203.8 lb

## 2010-11-16 DIAGNOSIS — I10 Essential (primary) hypertension: Secondary | ICD-10-CM

## 2010-11-16 DIAGNOSIS — J309 Allergic rhinitis, unspecified: Secondary | ICD-10-CM

## 2010-11-16 DIAGNOSIS — J40 Bronchitis, not specified as acute or chronic: Secondary | ICD-10-CM

## 2010-11-16 DIAGNOSIS — J029 Acute pharyngitis, unspecified: Secondary | ICD-10-CM

## 2010-11-16 DIAGNOSIS — H6690 Otitis media, unspecified, unspecified ear: Secondary | ICD-10-CM

## 2010-11-16 DIAGNOSIS — H6693 Otitis media, unspecified, bilateral: Secondary | ICD-10-CM

## 2010-11-16 DIAGNOSIS — E669 Obesity, unspecified: Secondary | ICD-10-CM

## 2010-11-16 MED ORDER — BENZONATATE 100 MG PO CAPS: 100.0000 mg | ORAL_CAPSULE | Freq: Three times a day (TID) | ORAL | Status: DC | PRN

## 2010-11-16 MED ORDER — PHENTERMINE HCL 37.5 MG PO CAPS: 37.5000 mg | ORAL_CAPSULE | ORAL | Status: AC

## 2010-11-16 MED ORDER — AZITHROMYCIN 250 MG PO TABS: 250.0000 mg | ORAL_TABLET | Freq: Every day | ORAL | Status: DC

## 2010-11-16 MED ORDER — GUAIFENESIN ER 600 MG PO TB12: 600.0000 mg | ORAL_TABLET | Freq: Two times a day (BID) | ORAL | Status: DC

## 2010-11-16 MED ORDER — METHYLPREDNISOLONE (PAK) 4 MG PO TABS: 4.0000 mg | ORAL_TABLET | Freq: Every day | ORAL | Status: DC

## 2010-11-16 NOTE — Assessment & Plan Note (Signed)
B/l tms erythematous and retracted. Azithromycin, Mucinex and Medrol dose pack all prescribed, encouraged use of Netty Pot as well.

## 2010-11-16 NOTE — Assessment & Plan Note (Signed)
Improved but still scratchy and froggy, azithromycin has been prescribed, patient also noted to be mildly dehydrated increase clear fluid intake.

## 2010-11-16 NOTE — Progress Notes (Signed)
[name] [id number] 24-Feb-1945 11/16/2010      Progress Note-Follow Up  Subjective  Chief Complaint  Chief Complaint  Patient presents with  . Follow-up    3 week follow up    HPI  Patient is to see 65-year-old Caucasian female in today for followup of bronchitis. She reports her deep productive cough is better. No fevers, chills, chest pain, palpitations, shortness of breath. Unfortunately she still has a dry cough and significant postnasal drip. Her throat is irritated and Friday. She gets some bilateral ear pain as well. No dysphagia, GI or GU complaints noted at this time for  Past Medical History  Diagnosis Date  . Obesity   . Esophageal reflux   . Fibromyalgia   . Asthma   . Allergy     rhinitis  . Sleep apnea, obstructive   . Bronchitis, acute 10/31/2010  . Otitis media of both ears 11/16/2010    Past Surgical History  Procedure Date  . Bariatric surgery   . Lumbar disc surgery   . Bladder suspension     complicated by bowel nick/ clolostomy  . Gallbladder surgery 1978  . Appendectomy 1962  . Gastric bypass 2003    Patient also noted "gastric bypass resection - 2004"  . Cervical disc surgery     C-7 in 2004, C-4 and C-5 in 2010  . Colostomy bag     Confirm with patient. Listed under medical conditions on form dated 07/18/09.  [name]     Per medical history form dated 07/18/09.  [name] Hernia repair 2009    Two hernias and abdominal reconstruction  . Ivc filter     prophyllactically- no hx DVT    Family History  Problem Relation Age of Onset  . Alcohol abuse Mother   . Other Mother     accidental med overdose  . Emphysema Mother     smoked  . Bipolar disorder Mother   . Other Father     CHF  . Diabetes Father   . Cancer Father     throat ca/ smoked  . Alcohol abuse Father   . Stroke Father   . Hypertension Brother   . Hyperlipidemia Brother   . Obesity Brother   . Heart attack Maternal Grandfather   . Heart attack Paternal Grandmother    . Heart attack Paternal Grandfather   . Arthritis Son     psoriatic  . Arthritis Son     psoriatic  . Obesity Son     History   Social History  . Marital Status: Married    Spouse Name: N/A    Number of Children: N/A  . Years of Education: N/A   Occupational History  . Not on file.   Social History Main Topics  . Smoking status: Never Smoker   . Smokeless tobacco: Never Used  . Alcohol Use: No     special occasions  . Drug Use: No  . Sexually Active: Yes   Other Topics Concern  . Not on file   Social History Narrative  . No narrative on file    Current Outpatient Prescriptions on File Prior to Visit  Medication Sig Dispense Refill  . ACETAMINOPHEN-BUTALBITAL (BUPAP) 50-650 MG TABS Take 1 tablet by mouth every 8 (eight) hours as needed. For HA  3 each  0  . ADVAIR DISKUS 250-50 MCG/DOSE AEPB inhale 1 puff by mouth twice a day  180 each  3  . ALPRAZolam (XANAX) 0.25 MG tablet Take  0.25 mg by mouth daily. Confirm dosage amount of 0.5 indicated on medical history form dated 07/18/09.      . benzonatate (TESSALON) 100 MG capsule Take 1 capsule (100 mg total) by mouth 2 (two) times daily as needed.  20 capsule  0  . bifidobacterium infantis (ALIGN) capsule Take 1 capsule by mouth daily. Confirm patient still taking. Not on history form dated 07/18/09.      . calcitRIOL (ROCALTROL) 0.5 MCG capsule Take 0.5 mcg by mouth daily. Confirm patient still taking. Not on history form dated 07/18/09.       . clonazePAM (KLONOPIN) 1 MG tablet Take 1 mg by mouth at bedtime. Confirm dosage, not indicated on medical history form dated 07/18/09.      . DULoxetine (CYMBALTA) 60 MG capsule Take 60 mg by mouth daily. Confirm patient still taking. Not on history form dated 07/18/09.       . folic acid (FOLVITE) 1 MG tablet Take 1 tablet by mouth Daily.      [name] levocetirizine (XYZAL) 5 MG tablet Take 1 tablet (5 mg total) by mouth every evening. As needed -antihistamine  30 tablet  prn  .  montelukast (SINGULAIR) 10 MG tablet Take 1 tablet (10 mg total) by mouth at bedtime.  30 tablet  prn  . NON FORMULARY once a week. Allergy vaccine 1:10 G.O. Confirm patient still taking. Not on history form dated 07/18/09.       . NON FORMULARY CPAP- set on [employer]. Confirm patient still taking. Not on history form dated 07/18/09.       [name] Olopatadine HCl (PATANASE) 0.6 % SOLN Place 2 sprays into the nose daily. Confirm patient still taking. Not on history form dated 07/18/09.       [name] pantoprazole (PROTONIX) 40 MG tablet       . promethazine (PHENERGAN) 25 MG tablet Take 1 tablet (25 mg total) by mouth every 8 (eight) hours as needed for nausea.  30 tablet  0  . ranitidine (ZANTAC) 150 MG capsule 1 tab po bid prn reflux alternate with pantoprazole  60 capsule  3  . solifenacin (VESICARE) 10 MG tablet Take 1 tablet (10 mg total) by mouth daily.  30 tablet  2  . traZODone (DESYREL) 50 MG tablet Take 50 mg by mouth at bedtime. Take 1/2 tab. Confirm patient still taking. Not on history form dated 07/18/09.       . TUBERCULIN SYR 1CC/27GX1/2" (B-D TB SYRINGE 1CC/27GX1/2") 27G X 1/2" 1 ML MISC by Does not apply route as directed. Confirm patient still taking. Not on history form dated 07/18/09.       [name] ipratropium (ATROVENT) 0.06 % nasal spray Place 2 sprays into the nose 2 (two) times daily.          Allergies  Allergen Reactions  . Cephalexin   . Codeine   . Hydrocodone     Ask patient severity and reaction. Not indicated on medical history dated 07/18/09.  [name] Keflex     Ask patient severity and reaction. Not indicated on medical history dated 07/18/09.  . Levofloxacin   . Meperidine Hcl   . Morphine And Related     Ask patient severity and reaction. Not indicated on medical history dated 07/18/09.  [name] Oxycodone-Acetaminophen   . Penicillins   . Sulfonamide Derivatives   . Vicodin (Hydrocodone-Acetaminophen)     Ask patient severity and reaction. Not indicated on medical history dated 07/18/09.      Review  of Systems  Review of Systems  Constitutional: Negative for fever and malaise/fatigue.  HENT: Positive for ear pain, congestion and sore throat.   Eyes: Negative for discharge.  Respiratory: Positive for cough. Negative for shortness of breath and wheezing.   Cardiovascular: Negative for chest pain, palpitations and leg swelling.  Gastrointestinal: Negative for nausea, abdominal pain and diarrhea.  Genitourinary: Negative for dysuria.  Musculoskeletal: Negative for falls.  Skin: Negative for rash.  Neurological: Negative for loss of consciousness and headaches.  Endo/Heme/Allergies: Negative for polydipsia.  Psychiatric/Behavioral: Negative for depression and suicidal ideas. The patient is not nervous/anxious and does not have insomnia.     Objective  BP 127/83  Pulse 65  Temp(Src) 98.2 F (36.8 C) (Oral)  Ht 4\' 11"  (1.499 m)  Wt 203 lb 12.8 oz (92.443 kg)  BMI 41.16 kg/m2  SpO2 94%  Physical Exam  Physical Exam  Constitutional: She is oriented to person, place, and time and well-developed, well-nourished, and in no distress. No distress.  HENT:  Head: Normocephalic and atraumatic.  Right Ear: External ear normal.  Left Ear: External ear normal.  Mouth/Throat: No oropharyngeal exudate.       B/l TMs erythematous and retracted, dry mucus membranes, posterior auricular Lymphadenopathy b/l. Nasal mucosa boggy and erythematous  Eyes: Conjunctivae are normal. Right eye exhibits no discharge. Left eye exhibits no discharge.  Neck: Neck supple. No thyromegaly present.  Cardiovascular: Normal rate, regular rhythm and normal heart sounds.   No murmur heard. Pulmonary/Chest: Effort normal and breath sounds normal. She has no wheezes.  Abdominal: She exhibits no distension and no mass.  Musculoskeletal: She exhibits no edema.  Lymphadenopathy:    She has no cervical adenopathy.  Neurological: She is alert and oriented to person, place, and time.  Skin: Skin is warm  and dry. No rash noted. She is not diaphoretic.  Psychiatric: Memory, affect and judgment normal.    Lab Results  Component Value Date   TSH 0.571 11/07/2009   Lab Results  Component Value Date   WBC 5.6 10/28/2009   HGB 12.5 07/17/2010   HCT 38.4 07/17/2010   MCV 90.1 07/17/2010   PLT 263 07/17/2010   Lab Results  Component Value Date   CREATININE 0.77 07/17/2010   BUN 14 07/17/2010   NA 141 07/17/2010   K 3.5 07/17/2010   CL 102 07/17/2010   CO2 30 07/17/2010   Lab Results  Component Value Date   ALT 13 07/17/2010   AST 16 07/17/2010   ALKPHOS 110 07/17/2010   BILITOT 0.3 07/17/2010     Assessment & Plan  Otitis media of both ears B/l tms erythematous and retracted. Azithromycin, Mucinex and Medrol dose pack all prescribed, encouraged use of Netty Pot as well.  Pharyngitis Improved but still scratchy and froggy, azithromycin has been prescribed, patient also noted to be mildly dehydrated increase clear fluid intake.  ESSENTIAL HYPERTENSION, BENIGN Adequate control today no changes.  ALLERGIC RHINITIS Continue current meds, add Mucinex and nasal saline several times a day

## 2010-11-16 NOTE — Patient Instructions (Signed)

## 2010-11-16 NOTE — Assessment & Plan Note (Signed)
Adequate control today no changes 

## 2010-11-16 NOTE — Assessment & Plan Note (Signed)
Continue current meds, add Mucinex and nasal saline several times a day

## 2010-12-23 ENCOUNTER — Other Ambulatory Visit: Payer: Self-pay | Admitting: Family Medicine

## 2011-01-03 ENCOUNTER — Ambulatory Visit (INDEPENDENT_AMBULATORY_CARE_PROVIDER_SITE_OTHER): Payer: BC Managed Care – PPO | Admitting: Family Medicine

## 2011-01-03 ENCOUNTER — Encounter: Payer: Self-pay | Admitting: Family Medicine

## 2011-01-03 VITALS — BP 150/83 | HR 72 | Temp 98.0°F | Ht 59.0 in | Wt 197.1 lb

## 2011-01-03 DIAGNOSIS — F411 Generalized anxiety disorder: Secondary | ICD-10-CM

## 2011-01-03 DIAGNOSIS — F341 Dysthymic disorder: Secondary | ICD-10-CM

## 2011-01-03 DIAGNOSIS — F419 Anxiety disorder, unspecified: Secondary | ICD-10-CM

## 2011-01-03 DIAGNOSIS — M549 Dorsalgia, unspecified: Secondary | ICD-10-CM

## 2011-01-03 DIAGNOSIS — F418 Other specified anxiety disorders: Secondary | ICD-10-CM

## 2011-01-03 DIAGNOSIS — I10 Essential (primary) hypertension: Secondary | ICD-10-CM

## 2011-01-03 DIAGNOSIS — E119 Type 2 diabetes mellitus without complications: Secondary | ICD-10-CM

## 2011-01-03 MED ORDER — ALPRAZOLAM 0.25 MG PO TABS: ORAL_TABLET | ORAL | Status: DC

## 2011-01-03 NOTE — Patient Instructions (Signed)
Back Pain, Adult Low back pain is very common. About 1 in 5 people have back pain.The cause of low back pain is rarely dangerous. The pain often gets better over time.About half of people with a sudden onset of back pain feel better in just 2 weeks. About 8 in 10 people feel better by 6 weeks.  CAUSES Some common causes of back pain include:  Strain of the muscles or ligaments supporting the spine.   Wear and tear (degeneration) of the spinal discs.   Arthritis.   Direct injury to the back.  DIAGNOSIS Most of the time, the direct cause of low back pain is not known.However, back pain can be treated effectively even when the exact cause of the pain is unknown.Answering your caregiver's questions about your overall health and symptoms is one of the most accurate ways to make sure the cause of your pain is not dangerous. If your caregiver needs more information, he or she may order lab work or imaging tests (X-rays or MRIs).However, even if imaging tests show changes in your back, this usually does not require surgery. HOME CARE INSTRUCTIONS For many people, back pain returns.Since low back pain is rarely dangerous, it is often a condition that people can learn to manageon their own.   Remain active. It is stressful on the back to sit or stand in one place. Do not sit, drive, or stand in one place for more than 30 minutes at a time. Take short walks on level surfaces as soon as pain allows.Try to increase the length of time you walk each day.   Do not stay in bed.Resting more than 1 or 2 days can delay your recovery.   Do not avoid exercise or work.Your body is made to move.It is not dangerous to be active, even though your back may hurt.Your back will likely heal faster if you return to being active before your pain is gone.   Pay attention to your body when you bend and lift. Many people have less discomfortwhen lifting if they bend their knees, keep the load close to their  bodies,and avoid twisting. Often, the most comfortable positions are those that put less stress on your recovering back.   Find a comfortable position to sleep. Use a firm mattress and lie on your side with your knees slightly bent. If you lie on your back, put a pillow under your knees.   Only take over-the-counter or prescription medicines as directed by your caregiver. Over-the-counter medicines to reduce pain and inflammation are often the most helpful.Your caregiver may prescribe muscle relaxant drugs.These medicines help dull your pain so you can more quickly return to your normal activities and healthy exercise.   Put ice on the injured area.   Put ice in a plastic bag.   Place a towel between your skin and the bag.   Leave the ice on for 15 to 20 minutes, 3 to 4 times a day for the first 2 to 3 days. After that, ice and heat may be alternated to reduce pain and spasms.   Ask your caregiver about trying back exercises and gentle massage. This may be of some benefit.   Avoid feeling anxious or stressed.Stress increases muscle tension and can worsen back pain.It is important to recognize when you are anxious or stressed and learn ways to manage it.Exercise is a great option.  SEEK MEDICAL CARE IF:  You have pain that is not relieved with rest or medicine.   You have   pain that does not improve in 1 week.   You have new symptoms.   You are generally not feeling well.  SEEK IMMEDIATE MEDICAL CARE IF:   You have pain that radiates from your back into your legs.   You develop new bowel or bladder control problems.   You have unusual weakness or numbness in your arms or legs.   You develop nausea or vomiting.   You develop abdominal pain.   You feel faint.  Document Released: 01/08/2005 Document Revised: 09/20/2010 Document Reviewed: 05/29/2010 [employer] Patient Information 2012 [employer]. 

## 2011-01-03 NOTE — Progress Notes (Signed)
[name] [id number] [birth year] 01/03/2011      Progress Note-Follow Up  Subjective  Chief Complaint  Chief Complaint  Patient presents with  . Follow-up    6 week follow up    HPI  Patient is a 65 year old Caucasian female in today for follow up of multiple medical problems. She continues to have chronic abdominal pain diffuse. Has trouble finding a comfortable position at times. Complains of ongoing low back pain as well. Somewhat worsened recently. No falls or injury. No fevers, chills, congestion, cough, chest pain, palpitations, shortness of breath. She is struggling with an excessive amount of stress and anxiety due to difficult family relations but does follow up with psychiatry in this regard.  Past Medical History  Diagnosis Date  . Obesity   . Esophageal reflux   . Fibromyalgia   . Asthma   . Allergy     rhinitis  . Sleep apnea, obstructive   . Bronchitis, acute 10/31/2010  . Otitis media of both ears 11/16/2010    Past Surgical History  Procedure Date  . Bariatric surgery   . Lumbar disc surgery   . Bladder suspension     complicated by bowel nick/ clolostomy  . Gallbladder surgery 1978  . Appendectomy 1962  . Gastric bypass 2003    Patient also noted "gastric bypass resection - 2004"  . Cervical disc surgery     C-7 in 2004, C-4 and C-5 in 2010  . Colostomy bag     Confirm with patient. Listed under medical conditions on form dated 07/18/09.  [name]     Per medical history form dated 07/18/09.  [name] Hernia repair 2009    Two hernias and abdominal reconstruction  . Ivc filter     prophyllactically- no hx DVT    Family History  Problem Relation Age of Onset  . Alcohol abuse Mother   . Other Mother     accidental med overdose  . Emphysema Mother     smoked  . Bipolar disorder Mother   . Other Father     CHF  . Diabetes Father   . Cancer Father     throat ca/ smoked  . Alcohol abuse Father   . Stroke Father   . Hypertension Brother     . Hyperlipidemia Brother   . Obesity Brother   . Heart attack Maternal Grandfather   . Heart attack Paternal Grandmother   . Heart attack Paternal Grandfather   . Arthritis Son     psoriatic  . Arthritis Son     psoriatic  . Obesity Son     History   Social History  . Marital Status: Married    Spouse Name: N/A    Number of Children: N/A  . Years of Education: N/A   Occupational History  . Not on file.   Social History Main Topics  . Smoking status: Never Smoker   . Smokeless tobacco: Never Used  . Alcohol Use: No     special occasions  . Drug Use: No  . Sexually Active: Yes   Other Topics Concern  . Not on file   Social History Narrative  . No narrative on file    Current Outpatient Prescriptions on File Prior to Visit  Medication Sig Dispense Refill  . ACETAMINOPHEN-BUTALBITAL (BUPAP) 50-650 MG TABS Take 1 tablet by mouth every 8 (eight) hours as needed. For HA  3 each  0  . ADVAIR DISKUS 250-50 MCG/DOSE AEPB inhale 1 puff by  mouth twice a day  180 each  3  . ALPRAZolam (XANAX) 0.25 MG tablet Take 0.25 mg by mouth daily. Confirm dosage amount of 0.5 indicated on medical history form dated 07/18/09.      . bifidobacterium infantis (ALIGN) capsule Take 1 capsule by mouth daily. Confirm patient still taking. Not on history form dated 07/18/09.      . calcitRIOL (ROCALTROL) 0.5 MCG capsule Take 0.5 mcg by mouth daily. Confirm patient still taking. Not on history form dated 07/18/09.       . clonazePAM (KLONOPIN) 1 MG tablet Take 2 mg by mouth at bedtime. Confirm dosage, not indicated on medical history form dated 07/18/09.      . DULoxetine (CYMBALTA) 60 MG capsule Take 60 mg by mouth daily. Confirm patient still taking. Not on history form dated 07/18/09.       . folic acid (FOLVITE) 1 MG tablet Take 1 tablet by mouth Daily.      [name] ipratropium (ATROVENT) 0.06 % nasal spray Place 2 sprays into the nose 2 (two) times daily.        [name] levocetirizine (XYZAL) 5 MG tablet Take  1 tablet (5 mg total) by mouth every evening. As needed -antihistamine  30 tablet  prn  . montelukast (SINGULAIR) 10 MG tablet Take 1 tablet (10 mg total) by mouth at bedtime.  30 tablet  prn  . NON FORMULARY once a week. Allergy vaccine 1:10 G.O. Confirm patient still taking. Not on history form dated 07/18/09.       . NON FORMULARY CPAP- set on 12 [employer]. Confirm patient still taking. Not on history form dated 07/18/09.       [name] Olopatadine HCl (PATANASE) 0.6 % SOLN Place 2 sprays into the nose daily. Confirm patient still taking. Not on history form dated 07/18/09.       [name] pantoprazole (PROTONIX) 40 MG tablet       . ranitidine (ZANTAC) 150 MG capsule 1 tab po bid prn reflux alternate with pantoprazole  60 capsule  3  . VESICARE 10 MG tablet take 1 tablet by mouth once daily  30 tablet  2  . traZODone (DESYREL) 50 MG tablet Take 50 mg by mouth at bedtime. Take 1/2 tab. Confirm patient still taking. Not on history form dated 07/18/09.       . TUBERCULIN SYR 1CC/27GX1/2" (B-D TB SYRINGE 1CC/27GX1/2") 27G X 1/2" 1 ML MISC by Does not apply route as directed. Confirm patient still taking. Not on history form dated 07/18/09.         Allergies  Allergen Reactions  . Cephalexin   . Codeine   . Hydrocodone     Ask patient severity and reaction. Not indicated on medical history dated 07/18/09.  [name] Keflex     Ask patient severity and reaction. Not indicated on medical history dated 07/18/09.  . Levofloxacin   . Meperidine Hcl   . Morphine And Related     Ask patient severity and reaction. Not indicated on medical history dated 07/18/09.  [name] Oxycodone-Acetaminophen   . Penicillins   . Sulfonamide Derivatives   . Vicodin (Hydrocodone-Acetaminophen)     Ask patient severity and reaction. Not indicated on medical history dated 07/18/09.    Review of Systems  Review of Systems  Constitutional: Negative for fever, chills and malaise/fatigue.  HENT: Negative for hearing loss, nosebleeds and congestion.    Eyes: Negative for discharge.  Respiratory: Negative for cough, sputum production, shortness of breath and wheezing.  Cardiovascular: Negative for chest pain, palpitations and leg swelling.  Gastrointestinal: Negative for heartburn, nausea, vomiting, abdominal pain, diarrhea, constipation and blood in stool.  Genitourinary: Negative for dysuria, urgency, frequency and hematuria.  Musculoskeletal: Positive for myalgias, back pain and joint pain. Negative for falls.  Skin: Negative for rash.  Neurological: Negative for dizziness, tremors, sensory change, focal weakness, loss of consciousness, weakness and headaches.  Endo/Heme/Allergies: Negative for polydipsia. Does not bruise/bleed easily.  Psychiatric/Behavioral: Positive for depression. Negative for suicidal ideas. The patient is nervous/anxious. The patient does not have insomnia.     Objective  BP 150/83  Pulse 72  Temp(Src) 98 F (36.7 C) (Oral)  Ht 4\' 11"  (1.499 m)  Wt 197 lb 1.9 oz (89.413 kg)  BMI 39.81 kg/m2  SpO2 96%  Physical Exam  Physical Exam  Constitutional: She is oriented to person, place, and time and well-developed, well-nourished, and in no distress. No distress.  HENT:  Head: Normocephalic and atraumatic.  Eyes: Conjunctivae are normal.  Neck: Neck supple. No thyromegaly present.  Cardiovascular: Normal rate, regular rhythm and normal heart sounds.   No murmur heard. Pulmonary/Chest: Effort normal and breath sounds normal. She has no wheezes.  Abdominal: She exhibits no distension and no mass. There is no tenderness. There is no guarding.  Musculoskeletal: She exhibits no edema.  Lymphadenopathy:    She has no cervical adenopathy.  Neurological: She is alert and oriented to person, place, and time.  Skin: Skin is warm and dry. No rash noted. She is not diaphoretic.  Psychiatric: Memory, affect and judgment normal.    Lab Results  Component Value Date   TSH 0.571 11/07/2009   Lab Results    Component Value Date   WBC 6.2 07/17/2010   HGB 12.5 07/17/2010   HCT 38.4 07/17/2010   MCV 90.1 07/17/2010   PLT 263 07/17/2010   Lab Results  Component Value Date   CREATININE 0.77 07/17/2010   BUN 14 07/17/2010   NA 141 07/17/2010   K 3.5 07/17/2010   CL 102 07/17/2010   CO2 30 07/17/2010   Lab Results  Component Value Date   ALT 13 07/17/2010   AST 16 07/17/2010   ALKPHOS 110 07/17/2010   BILITOT 0.3 07/17/2010      Assessment & Plan   DM Recent good control, no changes.  ESSENTIAL HYPERTENSION, BENIGN Mild elevation at this visit, patient hesitant to change medications, minimize sodium  Depression with anxiety Patient following with psychiatry but very tearful today due to family stressors including a very difficult sister-in-law with bipolar disorder. She will call psychiatry if things get worse.  Back pain Patient struggles with chronic pain, having more trouble than usual. Encouraged ongoing physical activity and use pain meds sparingly

## 2011-01-08 ENCOUNTER — Encounter: Payer: Self-pay | Admitting: Family Medicine

## 2011-01-08 DIAGNOSIS — M549 Dorsalgia, unspecified: Secondary | ICD-10-CM | POA: Insufficient documentation

## 2011-01-08 DIAGNOSIS — F418 Other specified anxiety disorders: Secondary | ICD-10-CM

## 2011-01-08 HISTORY — DX: Other specified anxiety disorders: F41.8

## 2011-01-08 HISTORY — DX: Dorsalgia, unspecified: M54.9

## 2011-01-08 NOTE — Assessment & Plan Note (Signed)
Patient following with psychiatry but very tearful today due to family stressors including a very difficult sister-in-law with bipolar disorder. She will call psychiatry if things get worse.

## 2011-01-08 NOTE — Assessment & Plan Note (Signed)
Mild elevation at this visit, patient hesitant to change medications, minimize sodium

## 2011-01-08 NOTE — Assessment & Plan Note (Signed)
Patient struggles with chronic pain, having more trouble than usual. Encouraged ongoing physical activity and use pain meds sparingly

## 2011-01-08 NOTE — Assessment & Plan Note (Signed)
Recent good control, no changes.

## 2011-01-19 ENCOUNTER — Telehealth: Payer: Self-pay | Admitting: Hematology and Oncology

## 2011-01-19 NOTE — Telephone Encounter (Signed)
lmonvm for pt re appts for 1/28 and 1/31. Jan schedule mailed today. °

## 2011-01-30 ENCOUNTER — Other Ambulatory Visit: Payer: Self-pay | Admitting: Obstetrics and Gynecology

## 2011-01-30 DIAGNOSIS — Z1231 Encounter for screening mammogram for malignant neoplasm of breast: Secondary | ICD-10-CM

## 2011-01-31 ENCOUNTER — Ambulatory Visit (INDEPENDENT_AMBULATORY_CARE_PROVIDER_SITE_OTHER): Payer: BC Managed Care – PPO

## 2011-01-31 DIAGNOSIS — J309 Allergic rhinitis, unspecified: Secondary | ICD-10-CM

## 2011-02-04 ENCOUNTER — Other Ambulatory Visit: Payer: Self-pay | Admitting: Family Medicine

## 2011-02-13 ENCOUNTER — Ambulatory Visit
Admission: RE | Admit: 2011-02-13 | Discharge: 2011-02-13 | Disposition: A | Discharge status: Home / Self Care | Payer: BC Managed Care – PPO | Source: Ambulatory Visit | Attending: Obstetrics and Gynecology | Admitting: Obstetrics and Gynecology

## 2011-02-13 DIAGNOSIS — Z1231 Encounter for screening mammogram for malignant neoplasm of breast: Secondary | ICD-10-CM

## 2011-02-19 ENCOUNTER — Other Ambulatory Visit (HOSPITAL_BASED_OUTPATIENT_CLINIC_OR_DEPARTMENT_OTHER): Payer: BC Managed Care – PPO | Admitting: Lab

## 2011-02-19 DIAGNOSIS — Z9884 Bariatric surgery status: Secondary | ICD-10-CM

## 2011-02-19 DIAGNOSIS — D509 Iron deficiency anemia, unspecified: Secondary | ICD-10-CM

## 2011-02-19 LAB — FOLATE: Folate: 14.6 ng/mL

## 2011-02-19 LAB — BASIC METABOLIC PANEL
BUN: 13 mg/dL (ref 6–23)
CO2: 29 mEq/L (ref 19–32)
Chloride: 106 mEq/L (ref 96–112)
Creatinine, Ser: 0.83 mg/dL (ref 0.50–1.10)
Potassium: 4.3 mEq/L (ref 3.5–5.3)

## 2011-02-19 LAB — CBC WITH DIFFERENTIAL/PLATELET
Basophils Absolute: 0 10*3/uL (ref 0.0–0.1)
EOS%: 4.1 % (ref 0.0–7.0)
Eosinophils Absolute: 0.2 10*3/uL (ref 0.0–0.5)
MCH: 31.4 pg (ref 25.1–34.0)
MCHC: 33.5 g/dL (ref 31.5–36.0)
MCV: 93.7 fL (ref 79.5–101.0)
MONO#: 0.5 10*3/uL (ref 0.1–0.9)
NEUT%: 70.3 % (ref 38.4–76.8)
Platelets: 220 10*3/uL (ref 145–400)
RBC: 3.96 10*6/uL (ref 3.70–5.45)
RDW: 13.7 % (ref 11.2–14.5)
lymph#: 1.1 10*3/uL (ref 0.9–3.3)

## 2011-02-19 LAB — FERRITIN: Ferritin: 157 ng/mL (ref 10–291)

## 2011-02-19 LAB — VITAMIN B12: Vitamin B-12: 301 pg/mL (ref 211–911)

## 2011-02-19 LAB — IRON AND TIBC: UIBC: 241 ug/dL (ref 125–400)

## 2011-02-22 ENCOUNTER — Telehealth: Payer: Self-pay | Admitting: Hematology and Oncology

## 2011-02-22 ENCOUNTER — Ambulatory Visit (HOSPITAL_BASED_OUTPATIENT_CLINIC_OR_DEPARTMENT_OTHER): Payer: BC Managed Care – PPO | Admitting: Hematology and Oncology

## 2011-02-22 VITALS — BP 124/60 | HR 55 | Temp 98.2°F | Ht 59.0 in | Wt 200.0 lb

## 2011-02-22 DIAGNOSIS — D539 Nutritional anemia, unspecified: Secondary | ICD-10-CM

## 2011-02-22 DIAGNOSIS — D509 Iron deficiency anemia, unspecified: Secondary | ICD-10-CM

## 2011-02-22 NOTE — Progress Notes (Signed)
This office note has been dictated.

## 2011-02-22 NOTE — Progress Notes (Signed)
CC:   Gail Edge, MD [name], M.D. [name], M.D. Gail Heckler, MD Gail Coder, MD  IDENTIFYING STATEMENT:  The patient is a 66 year old woman with iron- deficiency anemia who presents for followup.  INTERVAL HISTORY:  [name] notes some baseline fatigue.  Is able to perform all activities of daily living. She is not short of breath.  She is afebrile.  She last received IV iron in March 2012.  Has not noted any rectal bleeding.  LABORATORY DATA:  02/19/2011, hemoglobin and hematocrit 12.5 and 38.4 respectively; iron 51, TIBC 292, saturation 17%, and ferritin 157 (170).  MEDICATIONS:  Medications reviewed and updated.  ALLERGIES:  Vicodin, oxycodone, morphine sulfate, Keflex, penicillin, erythromycin, and Levaquin.  PHYSICAL EXAMINATION:  General:  The patient is a well-appearing, well- nourished woman in no distress.  Vitals:  Pulse 55, blood pressure 124/60, temperature 98.2, respirations 20, weight 200 pounds.  HEENT: Head is atraumatic, normocephalic.  Sclerae anicteric.  Mouth moist. Chest:  Clear.  Abdomen:  Soft.  Bowel sounds present.  Extremities:  No calf tenderness.  LABORATORY DATA:  As above.  In addition, white cell count 6.1, platelets 220.  Sodium 143, potassium 4.3, chloride 106, CO2 of 29, BUN 13, creatinine 0.83, glucose 109, calcium 9.3.  IMPRESSION AND PLAN:  [name] is a 66 year old woman with past history of iron-deficiency anemia.  She has history of gastric bypass surgery performed in 2004.  She has also had several abdominal surgeries.  [name]'s current iron stores remain within range. Thus, no supplementation will be required at this time.  I have asked that she resume B12 100 mg every 3 days.  She follows up in 6 months' time.    ______________________________ Laurice Record, M.D. LIO/MEDQ  D:  02/22/2011  T:  02/22/2011  Job:  [id number]

## 2011-02-22 NOTE — Telephone Encounter (Signed)
appt made for 7/12 and 7/17,pt placed in calendar  aom

## 2011-03-28 ENCOUNTER — Ambulatory Visit: Payer: BC Managed Care – PPO | Admitting: Family Medicine

## 2011-04-07 ENCOUNTER — Other Ambulatory Visit: Payer: Self-pay | Admitting: Family Medicine

## 2011-06-11 ENCOUNTER — Ambulatory Visit (INDEPENDENT_AMBULATORY_CARE_PROVIDER_SITE_OTHER): Payer: BC Managed Care – PPO | Admitting: Family Medicine

## 2011-06-11 ENCOUNTER — Encounter: Payer: Self-pay | Admitting: Family Medicine

## 2011-06-11 VITALS — BP 125/83 | HR 62 | Temp 98.4°F | Ht 59.0 in | Wt 207.4 lb

## 2011-06-11 DIAGNOSIS — H1045 Other chronic allergic conjunctivitis: Secondary | ICD-10-CM

## 2011-06-11 DIAGNOSIS — R5383 Other fatigue: Secondary | ICD-10-CM

## 2011-06-11 DIAGNOSIS — G4733 Obstructive sleep apnea (adult) (pediatric): Secondary | ICD-10-CM

## 2011-06-11 DIAGNOSIS — H6690 Otitis media, unspecified, unspecified ear: Secondary | ICD-10-CM

## 2011-06-11 DIAGNOSIS — F341 Dysthymic disorder: Secondary | ICD-10-CM

## 2011-06-11 DIAGNOSIS — J40 Bronchitis, not specified as acute or chronic: Secondary | ICD-10-CM

## 2011-06-11 DIAGNOSIS — J329 Chronic sinusitis, unspecified: Secondary | ICD-10-CM

## 2011-06-11 DIAGNOSIS — R319 Hematuria, unspecified: Secondary | ICD-10-CM

## 2011-06-11 DIAGNOSIS — H1010 Acute atopic conjunctivitis, unspecified eye: Secondary | ICD-10-CM

## 2011-06-11 DIAGNOSIS — I10 Essential (primary) hypertension: Secondary | ICD-10-CM

## 2011-06-11 DIAGNOSIS — J019 Acute sinusitis, unspecified: Secondary | ICD-10-CM

## 2011-06-11 DIAGNOSIS — R5381 Other malaise: Secondary | ICD-10-CM

## 2011-06-11 DIAGNOSIS — F418 Other specified anxiety disorders: Secondary | ICD-10-CM

## 2011-06-11 DIAGNOSIS — G4730 Sleep apnea, unspecified: Secondary | ICD-10-CM

## 2011-06-11 DIAGNOSIS — D649 Anemia, unspecified: Secondary | ICD-10-CM

## 2011-06-11 LAB — RENAL FUNCTION PANEL
Albumin: 3.9 g/dL (ref 3.5–5.2)
BUN: 16 mg/dL (ref 6–23)
CO2: 28 mEq/L (ref 19–32)
Chloride: 106 mEq/L (ref 96–112)
Creatinine, Ser: 0.8 mg/dL (ref 0.4–1.2)

## 2011-06-11 LAB — IBC PANEL
Iron: 59 ug/dL (ref 42–145)
Saturation Ratios: 19.3 % — ABNORMAL LOW (ref 20.0–50.0)

## 2011-06-11 LAB — CBC
HCT: 37 % (ref 36.0–46.0)
MCV: 92.9 fl (ref 78.0–100.0)
RBC: 3.98 Mil/uL (ref 3.87–5.11)
RDW: 14.4 % (ref 11.5–14.6)
WBC: 4.9 10*3/uL (ref 4.5–10.5)

## 2011-06-11 LAB — POCT URINALYSIS DIPSTICK
Ketones, UA: NEGATIVE
Leukocytes, UA: NEGATIVE
Protein, UA: NEGATIVE
pH, UA: 5

## 2011-06-11 MED ORDER — OLOPATADINE HCL 0.2 % OP SOLN: 1.0000 [drp] | Freq: Two times a day (BID) | OPHTHALMIC | Status: DC

## 2011-06-11 MED ORDER — AZITHROMYCIN 250 MG PO TABS: 250.0000 mg | ORAL_TABLET | Freq: Every day | ORAL | Status: DC

## 2011-06-11 MED ORDER — METHYLPREDNISOLONE 4 MG PO KIT: PACK | ORAL | Status: AC

## 2011-06-11 MED ORDER — METHYLPHENIDATE HCL 10 MG PO TABS: 10.0000 mg | ORAL_TABLET | Freq: Every day | ORAL | Status: DC

## 2011-06-11 NOTE — Patient Instructions (Signed)

## 2011-06-11 NOTE — Assessment & Plan Note (Signed)
Uses CPAP regularly °

## 2011-06-11 NOTE — Assessment & Plan Note (Signed)
Multifactorial, related to medical conditions, stress, depression. Is  Given a prescription for Ritalin 10 mg to take in am to help and she agrees to discuss this with her psychiatrist before starting.

## 2011-06-11 NOTE — Assessment & Plan Note (Signed)
Adequately controlled, no change in meds 

## 2011-06-11 NOTE — Assessment & Plan Note (Signed)
Azithromycin and medrol dose pak given today. Mucinex bid and increase hydration

## 2011-06-11 NOTE — Assessment & Plan Note (Signed)
Follows with Dr Valinda Hoar

## 2011-06-11 NOTE — Progress Notes (Signed)
Patient ID: [name], female   DOB: 12/04/1945, 66 y.o.   MRN: [id number] [name] [id number] 13-Apr-1945 06/11/2011      Progress Note-Follow Up  Subjective  Chief Complaint  Chief Complaint  Patient presents with  . Fatigue    X months  . Abdominal Pain    nausea and headaches- when abd starts hurting  . Otitis Media    right ear    HPI  Patient is a 66 year old Caucasian female in today with multiple complaints. She is struggling with excessive fatigue. She uses her CPAP he is tired at the base of murmur she's having trouble driving or getting tasks completed., Headaches low-grade fevers and chills. Some ear pain as recently started as well. Slight throat irritation and cough are noted. No chest pain or palpitations. No GI or GU complaints. She is under a great deal of stress and notes that her depression is slowly worsening. She does follow with Dr. Excell Seltzer in this regard.  Past Medical History  Diagnosis Date  . Obesity   . Esophageal reflux   . Fibromyalgia   . Asthma   . Allergy     rhinitis  . Sleep apnea, obstructive   . Bronchitis, acute 10/31/2010  . Otitis media of both ears 11/16/2010  . Depression with anxiety 01/08/2011  . Back pain 01/08/2011  . Sinusitis 06/11/2011  . Fatigue 06/11/2011    Past Surgical History  Procedure Date  . Bariatric surgery   . Lumbar disc surgery   . Bladder suspension     complicated by bowel nick/ clolostomy  . Gallbladder surgery 1978  . Appendectomy 1962  . Gastric bypass 2003    Patient also noted "gastric bypass resection - 2004"  . Cervical disc surgery     C-7 in 2004, C-4 and C-5 in 2010  . Colostomy bag     Confirm with patient. Listed under medical conditions on form dated 07/18/09.  [name]     Per medical history form dated 07/18/09.  [name] Hernia repair 2009    Two hernias and abdominal reconstruction  . Ivc filter     prophyllactically- no hx DVT    Family History  Problem Relation Age of Onset   . Alcohol abuse Mother   . Other Mother     accidental med overdose  . Emphysema Mother     smoked  . Bipolar disorder Mother   . Other Father     CHF  . Diabetes Father   . Cancer Father     throat ca/ smoked  . Alcohol abuse Father   . Stroke Father   . Hypertension Brother   . Hyperlipidemia Brother   . Obesity Brother   . Heart attack Maternal Grandfather   . Heart attack Paternal Grandmother   . Heart attack Paternal Grandfather   . Arthritis Son     psoriatic  . Arthritis Son     psoriatic  . Obesity Son     History   Social History  . Marital Status: Married    Spouse Name: N/A    Number of Children: N/A  . Years of Education: N/A   Occupational History  . Not on file.   Social History Main Topics  . Smoking status: Never Smoker   . Smokeless tobacco: Never Used  . Alcohol Use: No     special occasions  . Drug Use: No  . Sexually Active: Yes   Other Topics Concern  .  Not on file   Social History Narrative  . No narrative on file    Current Outpatient Prescriptions on File Prior to Visit  Medication Sig Dispense Refill  . ALPRAZolam (XANAX) 0.25 MG tablet Confirm dosage amount of 0.5 indicated on medical history form dated 07/18/09.  She reports she has prescription 0.25mg  tabs, 0.5 mg and 1 mg tabs and uses them alternately at 0.25 mg to 1 mg doses.  1 tablet  0  . bifidobacterium infantis (ALIGN) capsule Take 1 capsule by mouth daily. Confirm patient still taking. Not on history form dated 07/18/09.      . calcitRIOL (ROCALTROL) 0.5 MCG capsule Take 0.5 mcg by mouth daily. Confirm patient still taking. Not on history form dated 07/18/09.       . clonazePAM (KLONOPIN) 1 MG tablet Take 2 mg by mouth at bedtime. Confirm dosage, not indicated on medical history form dated 07/18/09.      . DULoxetine (CYMBALTA) 60 MG capsule Take 90 mg by mouth daily. Confirm patient still taking. Not on history form dated 07/18/09.       [name] ipratropium (ATROVENT) 0.06 %  nasal spray Place 2 sprays into the nose 2 (two) times daily.        [name] levocetirizine (XYZAL) 5 MG tablet Take 1 tablet (5 mg total) by mouth every evening. As needed -antihistamine  30 tablet  prn  . montelukast (SINGULAIR) 10 MG tablet Take 1 tablet (10 mg total) by mouth at bedtime.  30 tablet  prn  . NON FORMULARY once a week. Allergy vaccine 1:10 G.O. Confirm patient still taking. Not on history form dated 07/18/09.       . NON FORMULARY CPAP- set on [employer]. Confirm patient still taking. Not on history form dated 07/18/09.       [name] Olopatadine HCl (PATANASE) 0.6 % SOLN Place 2 sprays into the nose as needed. Confirm patient still taking. Not on history form dated 07/18/09.       . ranitidine (ZANTAC) 150 MG tablet take 1 tablet by mouth twice a day ALTERNATE WITH PANTOPRAZOLE  60 tablet  3  . traZODone (DESYREL) 50 MG tablet Take 100 mg by mouth at bedtime. Take 1/2 tab. Confirm patient still taking. Not on history form dated 07/18/09.       . TUBERCULIN SYR 1CC/27GX1/2" (B-D TB SYRINGE 1CC/27GX1/2") 27G X 1/2" 1 ML MISC by Does not apply route as directed. Confirm patient still taking. Not on history form dated 07/18/09.       [name] ADVAIR DISKUS 250-50 MCG/DOSE AEPB inhale 1 puff by mouth twice a day  180 each  3  . lamoTRIgine (LAMICTAL) 100 MG tablet       . methylphenidate (RITALIN) 10 MG tablet Take 1 tablet (10 mg total) by mouth daily.  30 tablet  0    Allergies  Allergen Reactions  . Cephalexin   . Cephalexin     Ask patient severity and reaction. Not indicated on medical history dated 07/18/09.  . Codeine   . Hydrocodone     Ask patient severity and reaction. Not indicated on medical history dated 07/18/09.  . Levofloxacin   . Meperidine Hcl   . Morphine And Related     Ask patient severity and reaction. Not indicated on medical history dated 07/18/09.  [name] Oxycodone-Acetaminophen   . Penicillins   . Sulfonamide Derivatives   . Vicodin (Hydrocodone-Acetaminophen)     Ask patient  severity and reaction. Not indicated on  medical history dated 07/18/09.    Review of Systems  Review of Systems  Constitutional: Positive for malaise/fatigue. Negative for fever.  HENT: Positive for ear pain and congestion.   Eyes: Negative for discharge.  Respiratory: Positive for cough. Negative for shortness of breath.   Cardiovascular: Negative for chest pain, palpitations and leg swelling.  Gastrointestinal: Negative for nausea, abdominal pain and diarrhea.  Genitourinary: Negative for dysuria.  Musculoskeletal: Positive for myalgias. Negative for falls.  Skin: Negative for rash.  Neurological: Positive for headaches. Negative for loss of consciousness.  Endo/Heme/Allergies: Negative for polydipsia.  Psychiatric/Behavioral: Negative for depression and suicidal ideas. The patient is not nervous/anxious and does not have insomnia.     Objective  BP 125/83  Pulse 62  Temp(Src) 98.4 F (36.9 C) (Temporal)  Ht 4\' 11"  (1.499 m)  Wt 207 lb 6.4 oz (94.076 kg)  BMI 41.89 kg/m2  SpO2 95%  Physical Exam  Physical Exam  Constitutional: She is oriented to person, place, and time and well-developed, well-nourished, and in no distress. No distress.  HENT:  Head: Normocephalic and atraumatic.       TMs mildly erythematous. Nasal mucosa boggy and erythematous  Eyes: Conjunctivae are normal.  Neck: Neck supple. No thyromegaly present.  Cardiovascular: Normal rate, regular rhythm and normal heart sounds.   No murmur heard. Pulmonary/Chest: Effort normal and breath sounds normal. She has no wheezes.  Abdominal: She exhibits no distension and no mass.  Musculoskeletal: She exhibits no edema.  Lymphadenopathy:    She has no cervical adenopathy.  Neurological: She is alert and oriented to person, place, and time.  Skin: Skin is warm and dry. No rash noted. She is not diaphoretic.  Psychiatric: Memory, affect and judgment normal.    Lab Results  Component Value Date   TSH 0.571  11/07/2009   Lab Results  Component Value Date   WBC 6.1 02/19/2011   HGB 12.4 02/19/2011   HCT 37.1 02/19/2011   MCV 93.7 02/19/2011   PLT 220 02/19/2011   Lab Results  Component Value Date   CREATININE 0.83 02/19/2011   BUN 13 02/19/2011   NA 143 02/19/2011   K 4.3 02/19/2011   CL 106 02/19/2011   CO2 29 02/19/2011   Lab Results  Component Value Date   ALT 13 07/17/2010   AST 16 07/17/2010   ALKPHOS 110 07/17/2010   BILITOT 0.3 07/17/2010    Assessment & Plan  Sinusitis acute Azithromycin and medrol dose pak given today. Mucinex bid and increase hydration  ESSENTIAL HYPERTENSION, BENIGN Adequately controlled, no change in meds  SLEEP APNEA, OBSTRUCTIVE Uses CPAP regularly  Fatigue Multifactorial, related to medical conditions, stress, depression. Is  Given a prescription for Ritalin 10 mg to take in am to help and she agrees to discuss this with her psychiatrist before starting.  Depression with anxiety Follows with Dr Valinda Hoar

## 2011-06-12 ENCOUNTER — Telehealth: Payer: Self-pay | Admitting: Family Medicine

## 2011-06-12 DIAGNOSIS — H6690 Otitis media, unspecified, unspecified ear: Secondary | ICD-10-CM

## 2011-06-12 DIAGNOSIS — J329 Chronic sinusitis, unspecified: Secondary | ICD-10-CM

## 2011-06-12 LAB — FERRITIN: Ferritin: 97.7 ng/mL (ref 10.0–291.0)

## 2011-06-12 MED ORDER — AZITHROMYCIN 250 MG PO TABS: 250.0000 mg | ORAL_TABLET | Freq: Every day | ORAL | Status: DC

## 2011-06-12 NOTE — Telephone Encounter (Signed)
[facility] did not have the zithromax Rx but she was able to get the rest of her prescriptions. Can it be called in again?

## 2011-06-14 LAB — URINE CULTURE

## 2011-06-14 MED ORDER — NITROFURANTOIN MONOHYD MACRO 100 MG PO CAPS: 100.0000 mg | ORAL_CAPSULE | Freq: Two times a day (BID) | ORAL | Status: DC

## 2011-06-14 NOTE — Progress Notes (Signed)
RX sent to pharmacy  

## 2011-06-14 NOTE — Progress Notes (Signed)
Addended by: Court Joy on: 06/14/2011 09:07 AM   Modules accepted: Orders

## 2011-06-16 NOTE — Progress Notes (Signed)
Pleas double check she has been notified about UTI and that she got her Macrobid

## 2011-06-28 ENCOUNTER — Encounter: Payer: Self-pay | Admitting: Family Medicine

## 2011-06-28 ENCOUNTER — Ambulatory Visit (INDEPENDENT_AMBULATORY_CARE_PROVIDER_SITE_OTHER): Payer: BC Managed Care – PPO | Admitting: Family Medicine

## 2011-06-28 VITALS — BP 130/74 | HR 61 | Temp 97.4°F | Ht 59.0 in | Wt 210.1 lb

## 2011-06-28 DIAGNOSIS — F418 Other specified anxiety disorders: Secondary | ICD-10-CM

## 2011-06-28 DIAGNOSIS — F341 Dysthymic disorder: Secondary | ICD-10-CM

## 2011-06-28 DIAGNOSIS — R5383 Other fatigue: Secondary | ICD-10-CM

## 2011-06-28 DIAGNOSIS — G4733 Obstructive sleep apnea (adult) (pediatric): Secondary | ICD-10-CM

## 2011-06-28 DIAGNOSIS — N390 Urinary tract infection, site not specified: Secondary | ICD-10-CM

## 2011-06-28 DIAGNOSIS — R319 Hematuria, unspecified: Secondary | ICD-10-CM

## 2011-06-28 DIAGNOSIS — I10 Essential (primary) hypertension: Secondary | ICD-10-CM

## 2011-06-28 DIAGNOSIS — H6693 Otitis media, unspecified, bilateral: Secondary | ICD-10-CM

## 2011-06-28 DIAGNOSIS — H6690 Otitis media, unspecified, unspecified ear: Secondary | ICD-10-CM

## 2011-06-28 DIAGNOSIS — R5381 Other malaise: Secondary | ICD-10-CM

## 2011-06-28 LAB — POCT URINALYSIS DIPSTICK
Bilirubin, UA: NEGATIVE
Glucose, UA: NEGATIVE
Ketones, UA: NEGATIVE
Leukocytes, UA: NEGATIVE
Nitrite, UA: NEGATIVE
pH, UA: 5

## 2011-06-28 MED ORDER — CIPROFLOXACIN HCL 250 MG PO TABS: 250.0000 mg | ORAL_TABLET | Freq: Two times a day (BID) | ORAL | Status: DC

## 2011-06-28 NOTE — Assessment & Plan Note (Signed)
Well controlled, no controlled 

## 2011-06-28 NOTE — Progress Notes (Signed)
Patient ID: [name], female   DOB: [birth year], 66 y.o.   MRN: [id number] [name] [id number] 10-Jul-1945 06/28/2011      Progress Note-Follow Up  Subjective  Chief Complaint  Chief Complaint  Patient presents with  . Follow-up    3 week     HPI  This is a 66 year old Caucasian female who is in today for followup of stomach feeling great. She has not started the Ritalin as prescribed at the last visit for fatigue and continues to struggle with severe fatigue. She took the azithromycin for the sinus is in Macrobid for UTI and while she had GI upset she pushed through it. She denies fevers chills and does feel her head congestion is somewhat better but she does continue with symptoms. She is complaining of a low-grade cough with some postnasal drip occasionally productive of some white phlegm. Some slight ear discomfort. No obvious fevers or chills. Singulair and Xyzal are managing her respiratory and allergy symptoms somewhat. She's not presently using her Advair but is not having any shortness of breath or wheezing. She does know recently she's had some increased trouble for leg cramps. He acknowledges it was while she was in a craft fair standing on her feet for many hours a day and 80 heat. Recurred in her talus and in her thighs area and she is frustrated about her persistent fatigue since her last visit denies any urinary complaints today.  Past Medical History  Diagnosis Date  . Obesity   . Esophageal reflux   . Fibromyalgia   . Asthma   . Allergy     rhinitis  . Sleep apnea, obstructive   . Bronchitis, acute 10/31/2010  . Otitis media of both ears 11/16/2010  . Depression with anxiety 01/08/2011  . Back pain 01/08/2011  . Sinusitis 06/11/2011  . Fatigue 06/11/2011    Past Surgical History  Procedure Date  . Bariatric surgery   . Lumbar disc surgery   . Bladder suspension     complicated by bowel nick/ clolostomy  . Gallbladder surgery 1978  . Appendectomy 1962    . Gastric bypass 2003    Patient also noted "gastric bypass resection - 2004"  . Cervical disc surgery     C-7 in 2004, C-4 and C-5 in 2010  . Colostomy bag     Confirm with patient. Listed under medical conditions on form dated 07/18/09.  [name]     Per medical history form dated 07/18/09.  [name] Hernia repair 2009    Two hernias and abdominal reconstruction  . Ivc filter     prophyllactically- no hx DVT    Family History  Problem Relation Age of Onset  . Alcohol abuse Mother   . Other Mother     accidental med overdose  . Emphysema Mother     smoked  . Bipolar disorder Mother   . Other Father     CHF  . Diabetes Father   . Cancer Father     throat ca/ smoked  . Alcohol abuse Father   . Stroke Father   . Hypertension Brother   . Hyperlipidemia Brother   . Obesity Brother   . Heart attack Maternal Grandfather   . Heart attack Paternal Grandmother   . Heart attack Paternal Grandfather   . Arthritis Son     psoriatic  . Arthritis Son     psoriatic  . Obesity Son     History   Social History  .  Marital Status: Married    Spouse Name: N/A    Number of Children: N/A  . Years of Education: N/A   Occupational History  . Not on file.   Social History Main Topics  . Smoking status: Never Smoker   . Smokeless tobacco: Never Used  . Alcohol Use: No     special occasions  . Drug Use: No  . Sexually Active: Yes   Other Topics Concern  . Not on file   Social History Narrative  . No narrative on file    Current Outpatient Prescriptions on File Prior to Visit  Medication Sig Dispense Refill  . ALPRAZolam (XANAX) 0.25 MG tablet Confirm dosage amount of 0.5 indicated on medical history form dated 07/18/09.  She reports she has prescription 0.25mg  tabs, 0.5 mg and 1 mg tabs and uses them alternately at 0.25 mg to 1 mg doses.  1 tablet  0  . calcitRIOL (ROCALTROL) 0.5 MCG capsule Take 0.5 mcg by mouth daily. Confirm patient still taking. Not on history form  dated 07/18/09.       . clonazePAM (KLONOPIN) 1 MG tablet Take 2 mg by mouth at bedtime. Confirm dosage, not indicated on medical history form dated 07/18/09.      . DULoxetine (CYMBALTA) 60 MG capsule Take 90 mg by mouth daily. Confirm patient still taking. Not on history form dated 07/18/09.       [name] ipratropium (ATROVENT) 0.06 % nasal spray Place 2 sprays into the nose 2 (two) times daily.        [name] lamoTRIgine (LAMICTAL) 100 MG tablet       . levocetirizine (XYZAL) 5 MG tablet Take 1 tablet (5 mg total) by mouth every evening. As needed -antihistamine  30 tablet  prn  . methylphenidate (RITALIN) 10 MG tablet Take 1 tablet (10 mg total) by mouth daily.  30 tablet  0  . montelukast (SINGULAIR) 10 MG tablet Take 1 tablet (10 mg total) by mouth at bedtime.  30 tablet  prn  . NON FORMULARY once a week. Allergy vaccine 1:10 G.O. Confirm patient still taking. Not on history form dated 07/18/09.       . NON FORMULARY CPAP- set on [employer]. Confirm patient still taking. Not on history form dated 07/18/09.       [name] Olopatadine HCl (PATANASE) 0.6 % SOLN Place 2 sprays into the nose as needed. Confirm patient still taking. Not on history form dated 07/18/09.       [name] Olopatadine HCl 0.2 % SOLN Apply 1 drop to eye 2 (two) times daily.  2.5 mL  2  . ranitidine (ZANTAC) 150 MG tablet take 1 tablet by mouth twice a day ALTERNATE WITH PANTOPRAZOLE  60 tablet  3  . traZODone (DESYREL) 50 MG tablet Take 100 mg by mouth at bedtime. Take 1/2 tab. Confirm patient still taking. Not on history form dated 07/18/09.       . TUBERCULIN SYR 1CC/27GX1/2" (B-D TB SYRINGE 1CC/27GX1/2") 27G X 1/2" 1 ML MISC by Does not apply route as directed. Confirm patient still taking. Not on history form dated 07/18/09.       [name] ADVAIR DISKUS 250-50 MCG/DOSE AEPB inhale 1 puff by mouth twice a day  180 each  3  . bifidobacterium infantis (ALIGN) capsule Take 1 capsule by mouth daily. Confirm patient still taking. Not on history form dated  07/18/09.        Allergies  Allergen Reactions  . Cephalexin   .  Cephalexin     Ask patient severity and reaction. Not indicated on medical history dated 07/18/09.  . Codeine   . Hydrocodone     Ask patient severity and reaction. Not indicated on medical history dated 07/18/09.  . Levofloxacin   . Meperidine Hcl   . Morphine And Related     Ask patient severity and reaction. Not indicated on medical history dated 07/18/09.  [name] Oxycodone-Acetaminophen   . Penicillins   . Sulfonamide Derivatives   . Vicodin (Hydrocodone-Acetaminophen)     Ask patient severity and reaction. Not indicated on medical history dated 07/18/09.    Review of Systems  Review of Systems  Constitutional: Positive for malaise/fatigue. Negative for fever.  HENT: Positive for ear pain, congestion and sore throat.   Eyes: Negative for discharge.  Respiratory: Positive for cough. Negative for shortness of breath.   Cardiovascular: Negative for chest pain, palpitations and leg swelling.  Gastrointestinal: Negative for nausea, abdominal pain and diarrhea.  Genitourinary: Negative for dysuria.  Musculoskeletal: Positive for myalgias. Negative for falls.       Worsening leg cramps recently  Skin: Negative for rash.  Neurological: Negative for loss of consciousness and headaches.  Endo/Heme/Allergies: Negative for polydipsia.  Psychiatric/Behavioral: Positive for depression. Negative for suicidal ideas. The patient is not nervous/anxious and does not have insomnia.     Objective  BP 130/74  Pulse 61  Temp(Src) 97.4 F (36.3 C) (Temporal)  Ht 4\' 11"  (1.499 m)  Wt 210 lb 1.9 oz (95.31 kg)  BMI 42.44 kg/m2  SpO2 97%  Physical Exam  Physical Exam  Constitutional: She is oriented to person, place, and time and well-developed, well-nourished, and in no distress. No distress.  HENT:  Head: Normocephalic and atraumatic.       TMs erythematous b/l. Nasal mucosa boggy and erythematous  Eyes: Conjunctivae are  normal.  Neck: Neck supple. No thyromegaly present.  Cardiovascular: Normal rate and regular rhythm.   Murmur heard.      2/6 systolic murmur  Pulmonary/Chest: Effort normal and breath sounds normal. She has no wheezes.  Abdominal: She exhibits no distension and no mass.  Musculoskeletal: She exhibits no edema.  Lymphadenopathy:    She has no cervical adenopathy.  Neurological: She is alert and oriented to person, place, and time.  Skin: Skin is warm and dry. No rash noted. She is not diaphoretic.  Psychiatric: Memory, affect and judgment normal.    Lab Results  Component Value Date   TSH 2.60 06/11/2011   Lab Results  Component Value Date   WBC 4.9 06/11/2011   HGB 12.3 06/11/2011   HCT 37.0 06/11/2011   MCV 92.9 06/11/2011   PLT 213.0 06/11/2011   Lab Results  Component Value Date   CREATININE 0.8 06/11/2011   BUN 16 06/11/2011   NA 143 06/11/2011   K 4.2 06/11/2011   CL 106 06/11/2011   CO2 28 06/11/2011   Lab Results  Component Value Date   ALT 13 07/17/2010   AST 16 07/17/2010   ALKPHOS 110 07/17/2010   BILITOT 0.3 07/17/2010    Assessment & Plan  ESSENTIAL HYPERTENSION, BENIGN Well controlled, no controlled  Otitis media of both ears Erythema b/l today. Started on Ciprofloxacin 250 mg po bid x 10 days, she will let us know if she has any trouble with it  Fatigue Persistent, multifactorial. Did not start the Ritalin as directed. Start 10 mg daily at this time  SLEEP APNEA, OBSTRUCTIVE Using CPAP routinely and not  having any difficulty with it  Depression with anxiety Patient struggling with frustration over worse pain and fatigue. Will consider an increase in Cymbalta to bid if symptoms do not improve with other changes made today

## 2011-06-28 NOTE — Assessment & Plan Note (Signed)
Persistent, multifactorial. Did not start the Ritalin as directed. Start 10 mg daily at this time

## 2011-06-28 NOTE — Assessment & Plan Note (Signed)
Using CPAP routinely and not having any difficulty with it

## 2011-06-28 NOTE — Assessment & Plan Note (Signed)
Patient struggling with frustration over worse pain and fatigue. Will consider an increase in Cymbalta to bid if symptoms do not improve with other changes made today

## 2011-06-28 NOTE — Assessment & Plan Note (Signed)
Erythema b/l today. Started on Ciprofloxacin 250 mg po bid x 10 days, she will let us know if she has any trouble with it

## 2011-06-28 NOTE — Patient Instructions (Signed)
Leg Cramps Leg cramps that occur during exercise can be caused by poor circulation or dehydration. However, muscle cramps that occur at rest or during the night are usually not due to any serious medical problem. Heat cramps may cause muscle spasms during hot weather.  CAUSES There is no clear cause for muscle cramps. However, dehydration may be a factor for those who do not drink enough fluids and those who exercise in the heat. Imbalances in the level of sodium, potassium, calcium or magnesium in the muscle tissue may also be a factor. Some medications, such as water pills (diuretics), may cause loss of chemicals that the body needs (like sodium and potassium) and cause muscle cramps. TREATMENT   Make sure your diet has enough fluids and essential minerals for the muscle to work normally.   Avoid strenuous exercise for several days if you have been having frequent leg cramps.   Stretch and massage the cramped muscle for several minutes.   Some medicines may be helpful in some patients with night cramps. Only take over-the-counter or prescription medicines as directed by your caregiver.  SEEK IMMEDIATE MEDICAL CARE IF:   Your leg cramps become worse.   Your foot becomes cold, numb, or blue.  Document Released: 02/16/2004 Document Revised: 12/28/2010 Document Reviewed: 02/03/2008 [facility] Patient Information 2012 [place], [place].  [facility]'s night time leg cramp medicine as needed Increase fluids, consider  A Gatorade a day when you are exerting outside for long periods of time in the heat

## 2011-06-30 LAB — URINE CULTURE: Colony Count: 4000

## 2011-07-05 ENCOUNTER — Ambulatory Visit: Payer: BC Managed Care – PPO | Admitting: Family Medicine

## 2011-07-13 DIAGNOSIS — E119 Type 2 diabetes mellitus without complications: Secondary | ICD-10-CM

## 2011-07-13 DIAGNOSIS — Z0279 Encounter for issue of other medical certificate: Secondary | ICD-10-CM

## 2011-07-13 DIAGNOSIS — I4891 Unspecified atrial fibrillation: Secondary | ICD-10-CM

## 2011-07-13 DIAGNOSIS — I824Z9 Acute embolism and thrombosis of unspecified deep veins of unspecified distal lower extremity: Secondary | ICD-10-CM

## 2011-07-20 ENCOUNTER — Telehealth: Payer: Self-pay | Admitting: Nurse Practitioner

## 2011-07-20 NOTE — Telephone Encounter (Signed)
Pt called stating she would like to cancel lab and MD appointments scheduled for July.  Reports her primary MD checked her ferritin and other labs and all were normal.  Denies any symptoms of iron deficiency.  Pt states she cannot afford 75.00 deductible for specialist.  She would prefer not to reschedule at this time but would like primary MD to follow and refer to Dr. Dalene Carrow again if needed.  Information to Dr. Dalene Carrow.

## 2011-07-23 ENCOUNTER — Encounter: Payer: Self-pay | Admitting: Family Medicine

## 2011-07-23 ENCOUNTER — Ambulatory Visit (INDEPENDENT_AMBULATORY_CARE_PROVIDER_SITE_OTHER): Payer: BC Managed Care – PPO | Admitting: Family Medicine

## 2011-07-23 VITALS — BP 114/66 | HR 64 | Temp 98.0°F | Ht 59.0 in | Wt 215.0 lb

## 2011-07-23 DIAGNOSIS — G4730 Sleep apnea, unspecified: Secondary | ICD-10-CM

## 2011-07-23 DIAGNOSIS — F341 Dysthymic disorder: Secondary | ICD-10-CM

## 2011-07-23 DIAGNOSIS — J019 Acute sinusitis, unspecified: Secondary | ICD-10-CM

## 2011-07-23 DIAGNOSIS — I10 Essential (primary) hypertension: Secondary | ICD-10-CM

## 2011-07-23 DIAGNOSIS — R51 Headache: Secondary | ICD-10-CM

## 2011-07-23 DIAGNOSIS — IMO0001 Reserved for inherently not codable concepts without codable children: Secondary | ICD-10-CM

## 2011-07-23 DIAGNOSIS — F418 Other specified anxiety disorders: Secondary | ICD-10-CM

## 2011-07-23 MED ORDER — METHYLPHENIDATE HCL 10 MG PO TABS: 20.0000 mg | ORAL_TABLET | Freq: Every day | ORAL | Status: DC

## 2011-07-23 MED ORDER — DULOXETINE HCL 60 MG PO CPEP: ORAL_CAPSULE | ORAL | Status: DC

## 2011-07-23 MED ORDER — RANITIDINE HCL 150 MG PO TABS: 300.0000 mg | ORAL_TABLET | Freq: Every day | ORAL | Status: DC

## 2011-07-23 MED ORDER — PANTOPRAZOLE SODIUM 40 MG PO TBEC: 40.0000 mg | DELAYED_RELEASE_TABLET | Freq: Every day | ORAL | Status: DC

## 2011-07-23 NOTE — Patient Instructions (Addendum)

## 2011-07-25 ENCOUNTER — Ambulatory Visit
Admission: RE | Admit: 2011-07-25 | Discharge: 2011-07-25 | Disposition: A | Discharge status: Home / Self Care | Payer: BC Managed Care – PPO | Source: Ambulatory Visit | Attending: Family Medicine | Admitting: Family Medicine

## 2011-07-25 DIAGNOSIS — R51 Headache: Secondary | ICD-10-CM

## 2011-07-25 DIAGNOSIS — J019 Acute sinusitis, unspecified: Secondary | ICD-10-CM

## 2011-07-25 NOTE — Assessment & Plan Note (Addendum)
Treated but HA persists, CT head negative for any acute infection, no further treatment at this time. Offered referral to neuro for further eval of HA and declines for now

## 2011-07-25 NOTE — Progress Notes (Signed)
Patient ID: [name], female   DOB: [birth year], 66 y.o.   MRN: [id number] [name] [id number] 1945/08/30 07/25/2011      Progress Note-Follow Up  Subjective  Chief Complaint  Chief Complaint  Patient presents with  . Follow-up    1 month     HPI  Patient is a 66 year old Caucasian female in today for followup. She continues to have persistent headache. We have treated her sinusitis and the congestion is somewhat improved but the headache persists. It is diffuse. No photophobia or phonophobia. She continues to struggle with fatigue, malaise, myalgias and a depression. She denies any chest pain, palpitations or worsening shortness of breath. No GI or GU complaints that are new are noted today. She continues to struggle with bladder incontinence secondary to her complications from surgery  Past Medical History  Diagnosis Date  . Obesity   . Esophageal reflux   . Fibromyalgia   . Asthma   . Allergy     rhinitis  . Sleep apnea, obstructive   . Bronchitis, acute 10/31/2010  . Otitis media of both ears 11/16/2010  . Depression with anxiety 01/08/2011  . Back pain 01/08/2011  . Sinusitis 06/11/2011  . Fatigue 06/11/2011    Past Surgical History  Procedure Date  . Bariatric surgery   . Lumbar disc surgery   . Bladder suspension     complicated by bowel nick/ clolostomy  . Gallbladder surgery 1978  . Appendectomy 1962  . Gastric bypass 2003    Patient also noted "gastric bypass resection - 2004"  . Cervical disc surgery     C-7 in 2004, C-4 and C-5 in 2010  . Colostomy bag     Confirm with patient. Listed under medical conditions on form dated 07/18/09.  [name]     Per medical history form dated 07/18/09.  [name] Hernia repair 2009    Two hernias and abdominal reconstruction  . Ivc filter     prophyllactically- no hx DVT    Family History  Problem Relation Age of Onset  . Alcohol abuse Mother   . Other Mother     accidental med overdose  . Emphysema Mother       smoked  . Bipolar disorder Mother   . Other Father     CHF  . Diabetes Father   . Cancer Father     throat ca/ smoked  . Alcohol abuse Father   . Stroke Father   . Hypertension Brother   . Hyperlipidemia Brother   . Obesity Brother   . Heart attack Maternal Grandfather   . Heart attack Paternal Grandmother   . Heart attack Paternal Grandfather   . Arthritis Son     psoriatic  . Arthritis Son     psoriatic  . Obesity Son     History   Social History  . Marital Status: Married    Spouse Name: N/A    Number of Children: N/A  . Years of Education: N/A   Occupational History  . Not on file.   Social History Main Topics  . Smoking status: Never Smoker   . Smokeless tobacco: Never Used  . Alcohol Use: No     special occasions  . Drug Use: No  . Sexually Active: Yes   Other Topics Concern  . Not on file   Social History Narrative  . No narrative on file    Current Outpatient Prescriptions on File Prior to Visit  Medication Sig  Dispense Refill  . ALPRAZolam (XANAX) 0.25 MG tablet Confirm dosage amount of 0.5 indicated on medical history form dated 07/18/09.  She reports she has prescription 0.25mg  tabs, 0.5 mg and 1 mg tabs and uses them alternately at 0.25 mg to 1 mg doses.  1 tablet  0  . bifidobacterium infantis (ALIGN) capsule Take 1 capsule by mouth daily. Confirm patient still taking. Not on history form dated 07/18/09.      . calcitRIOL (ROCALTROL) 0.5 MCG capsule Take 0.5 mcg by mouth daily. Confirm patient still taking. Not on history form dated 07/18/09.      . clonazePAM (KLONOPIN) 1 MG tablet Take 2 mg by mouth at bedtime. Confirm dosage, not indicated on medical history form dated 07/18/09.      . DULoxetine (CYMBALTA) 60 MG capsule Not on history form dated 07/18/09.  30 mg in am and 60 mg in pm, po  1 capsule  0  . ipratropium (ATROVENT) 0.06 % nasal spray Place 2 sprays into the nose 2 (two) times daily.        [name] lamoTRIgine (LAMICTAL) 100 MG tablet        . levocetirizine (XYZAL) 5 MG tablet Take 1 tablet (5 mg total) by mouth every evening. As needed -antihistamine  30 tablet  prn  . methylphenidate (RITALIN) 10 MG tablet Take 2 tablets (20 mg total) by mouth daily.  30 tablet  0  . montelukast (SINGULAIR) 10 MG tablet Take 1 tablet (10 mg total) by mouth at bedtime.  30 tablet  prn  . NON FORMULARY once a week. Allergy vaccine 1:10 G.O. Confirm patient still taking. Not on history form dated 07/18/09.       [name] Olopatadine HCl (PATANASE) 0.6 % SOLN Place 2 sprays into the nose as needed. Confirm patient still taking. Not on history form dated 07/18/09.       [name] Olopatadine HCl 0.2 % SOLN Apply 1 drop to eye 2 (two) times daily.  2.5 mL  2  . ranitidine (ZANTAC) 150 MG tablet Take 2 tablets (300 mg total) by mouth at bedtime.  60 tablet  3  . solifenacin (VESICARE) 10 MG tablet Take 10 mg by mouth daily.      . traZODone (DESYREL) 50 MG tablet Take 50 mg by mouth at bedtime.       . TUBERCULIN SYR 1CC/27GX1/2" (B-D TB SYRINGE 1CC/27GX1/2") 27G X 1/2" 1 ML MISC by Does not apply route as directed. Confirm patient still taking. Not on history form dated 07/18/09.       [name] ADVAIR DISKUS 250-50 MCG/DOSE AEPB inhale 1 puff by mouth twice a day  180 each  3  . NON FORMULARY CPAP- set on 12 [employer]. Confirm patient still taking. Not on history form dated 07/18/09.       [name] pantoprazole (PROTONIX) 40 MG tablet Take 1 tablet (40 mg total) by mouth daily.  30 tablet  3    Allergies  Allergen Reactions  . Cephalexin   . Cephalexin     Ask patient severity and reaction. Not indicated on medical history dated 07/18/09.  . Codeine   . Hydrocodone     Ask patient severity and reaction. Not indicated on medical history dated 07/18/09.  . Levofloxacin   . Meperidine Hcl   . Morphine And Related     Ask patient severity and reaction. Not indicated on medical history dated 07/18/09.  [name] Oxycodone-Acetaminophen   . Penicillins   . Sulfonamide Derivatives   .  Vicodin (Hydrocodone-Acetaminophen)     Ask patient severity and reaction. Not indicated on medical history dated 07/18/09.    Review of Systems  Review of Systems  Constitutional: Positive for malaise/fatigue. Negative for fever.  HENT: Positive for congestion.   Eyes: Negative for discharge.  Respiratory: Negative for shortness of breath.   Cardiovascular: Negative for chest pain, palpitations and leg swelling.  Gastrointestinal: Negative for nausea, abdominal pain and diarrhea.  Genitourinary: Negative for dysuria.  Musculoskeletal: Positive for myalgias and back pain. Negative for falls.  Skin: Negative for rash.  Neurological: Positive for headaches. Negative for loss of consciousness.  Endo/Heme/Allergies: Negative for polydipsia.  Psychiatric/Behavioral: Positive for depression. Negative for suicidal ideas. The patient is nervous/anxious. The patient does not have insomnia.      Objective  BP 114/66  Pulse 64  Temp 98 F (36.7 C) (Temporal)  Ht 4\' 11"  (1.499 m)  Wt 215 lb (97.523 kg)  BMI 43.42 kg/m2  SpO2 95%  Physical Exam  Physical Exam  Constitutional: She is oriented to person, place, and time and well-developed, well-nourished, and in no distress. No distress.  HENT:  Head: Normocephalic and atraumatic.  Eyes: Conjunctivae are normal.  Neck: Neck supple. No thyromegaly present.  Cardiovascular: Normal rate, regular rhythm and normal heart sounds.   No murmur heard. Pulmonary/Chest: Effort normal and breath sounds normal. She has no wheezes.  Abdominal: She exhibits no distension and no mass.  Musculoskeletal: She exhibits no edema.  Lymphadenopathy:    She has no cervical adenopathy.  Neurological: She is alert and oriented to person, place, and time.  Skin: Skin is warm and dry. No rash noted. She is not diaphoretic.  Psychiatric: Memory and judgment normal.       Flat affect    Lab Results  Component Value Date   TSH 2.60 06/11/2011   Lab Results   Component Value Date   WBC 4.9 06/11/2011   HGB 12.3 06/11/2011   HCT 37.0 06/11/2011   MCV 92.9 06/11/2011   PLT 213.0 06/11/2011   Lab Results  Component Value Date   CREATININE 0.8 06/11/2011   BUN 16 06/11/2011   NA 143 06/11/2011   K 4.2 06/11/2011   CL 106 06/11/2011   CO2 28 06/11/2011   Lab Results  Component Value Date   ALT 13 07/17/2010   AST 16 07/17/2010   ALKPHOS 110 07/17/2010   BILITOT 0.3 07/17/2010     Assessment & Plan  ESSENTIAL HYPERTENSION, BENIGN Adequately controlled, no changes  Depression with anxiety Persistently low mood but is not interested in changing meds at this time, can consider increasing her Cymbalta to 90 mg daily but is noncommittal at this time  Sinusitis acute Treated but HA persists, CT head negative for any acute infection, no further treatment at this time. Offered referral to neuro for further eval of HA and declines for now

## 2011-07-25 NOTE — Assessment & Plan Note (Signed)
Adequately controlled, no changes 

## 2011-07-25 NOTE — Assessment & Plan Note (Signed)
Persistently low mood but is not interested in changing meds at this time, can consider increasing her Cymbalta to 90 mg daily but is noncommittal at this time

## 2011-08-02 ENCOUNTER — Ambulatory Visit (INDEPENDENT_AMBULATORY_CARE_PROVIDER_SITE_OTHER): Payer: BC Managed Care – PPO | Admitting: Internal Medicine

## 2011-08-02 ENCOUNTER — Encounter: Payer: Self-pay | Admitting: Internal Medicine

## 2011-08-02 VITALS — BP 126/78 | HR 59 | Temp 97.2°F | Ht 59.0 in | Wt 212.2 lb

## 2011-08-02 DIAGNOSIS — J45909 Unspecified asthma, uncomplicated: Secondary | ICD-10-CM

## 2011-08-02 DIAGNOSIS — G4733 Obstructive sleep apnea (adult) (pediatric): Secondary | ICD-10-CM

## 2011-08-02 DIAGNOSIS — J309 Allergic rhinitis, unspecified: Secondary | ICD-10-CM

## 2011-08-02 DIAGNOSIS — J40 Bronchitis, not specified as acute or chronic: Secondary | ICD-10-CM

## 2011-08-02 MED ORDER — BENZONATATE 100 MG PO CAPS
200.0000 mg | ORAL_CAPSULE | Freq: Three times a day (TID) | ORAL | Status: DC | PRN
Start: 2011-08-02 — End: 2011-09-17

## 2011-08-02 MED ORDER — DOXYCYCLINE HYCLATE 100 MG PO TABS: ORAL_TABLET | ORAL | Status: DC

## 2011-08-02 NOTE — Progress Notes (Signed)
Subjective:    Patient ID: [name], female    DOB: 07/19/1945, 66 y.o.   MRN: [id number]  HPI 09/21/10- 66 year old female never smoker followed for allergic rhinitis, asthma, OSA, complicated by GERD, DM, HBP, obesity. Last here August 08, 2009 CPAP 12- Able to wear it all night, comfortable now. . Colostomy (colon nicked in a bladder sling procedure) was reversed. Had to revise the wound for staph and incidentally had a squamous cell skin cancer removed. Had iron infusion for anemia. No major respiratory problems through the past year. For the past 2-3 weeks has had burning and tearing of eyes with some postnasal drip, raspy throat.  Daily xyzal, patanase, singulair and ipratropium eye nasal spray..  Not aware of drafts into eye. Tried optivar, visine. Denies glaucoma.  08/02/11- 67 year old female never smoker followed for allergic rhinitis, asthma, OSA, complicated by GERD, DM, HBP, obesity. Still on vaccine, cough -productive-light yellow in color(finished Zpak today; both ears painful(full),and nasal drainage. Still using CPAP every night  and will get with DME for mask options. Bronchitis x5 days with dark yellow sputum. Color clearing with Z-Pak. Admits wheezing and frontal sinus pressure without headache, fever, sore throat or grossly purulent sputum.  Review of Systems-see HPI  Constitutional:   No-   weight loss, night sweats, fevers, chills, fatigue, lassitude. HEENT:   No-  headaches, difficulty swallowing, tooth/dental problems, sore throat,       No-  sneezing, itching, ear ache, + nasal congestion, post nasal drip,  CV:  No-   chest pain, orthopnea, PND, swelling in lower extremities, anasarca, dizziness, palpitations Resp: No-   shortness of breath with exertion or at rest.              +  productive cough,  No non-productive cough,  No-  coughing up of blood.              No-   change in color of mucus.  + wheezing.   Skin: No-   rash or lesions. GI:  No-   heartburn,  indigestion, abdominal pain, nausea, vomiting, GU:  MS:  No-   joint pain or swelling.   Neuro- nothing unusual Psych:  No- change in mood or affect. No depression or anxiety.  No memory loss.    Objective:   Physical Exam General- Alert, Oriented, Affect-appropriate, Distress- none acute  Very overweight Skin- rash-none, lesions- none, excoriation- none Lymphadenopathy- none Head- atraumatic            Eyes- Gross vision intact, PERRLA, conjunctivae clear, with watery thin secretions            Ears- Hearing, canals normal            Nose- Clear, No- Septal dev, mucus, polyps, erosion, perforation, +sniffing            Throat- Mallampati II , mucosa clear , drainage- none, tonsils- atrophic Neck- flexible , trachea midline, no stridor , thyroid nl, carotid no bruit Chest - symmetrical excursion , unlabored           Heart/CV- RRR , no murmur , no gallop  , no rub, nl s1 s2                           - JVD- none , edema- none, stasis changes- none, varices- right calf           Lung-  active cough and wheeze , dullness-none,  rub- none           Chest wall-  Abd-  Br/ Gen/ Rectal- Not done, not indicated Extrem- cyanosis- none, clubbing, none, atrophy- none, strength- nl Neuro- grossly intact to observation         Assessment & Plan:

## 2011-08-02 NOTE — Patient Instructions (Addendum)
Scripts sent for benzonatate for cough, doxycycline to hold as antibiotic if needed  Neb xop 0.63  Depo 80

## 2011-08-03 ENCOUNTER — Other Ambulatory Visit: Payer: BC Managed Care – PPO | Admitting: Lab

## 2011-08-07 ENCOUNTER — Ambulatory Visit (INDEPENDENT_AMBULATORY_CARE_PROVIDER_SITE_OTHER): Payer: BC Managed Care – PPO

## 2011-08-07 ENCOUNTER — Telehealth: Payer: Self-pay | Admitting: Hematology and Oncology

## 2011-08-07 DIAGNOSIS — J309 Allergic rhinitis, unspecified: Secondary | ICD-10-CM

## 2011-08-07 NOTE — Telephone Encounter (Signed)
pt called to cx 7/17 appt and did not wish to r/s at this time  aom

## 2011-08-08 ENCOUNTER — Ambulatory Visit: Payer: BC Managed Care – PPO | Admitting: Nurse Practitioner

## 2011-08-11 NOTE — Assessment & Plan Note (Addendum)
Acute bronchitis. Pattern could be viral but nonspecific. Plan-nebulizer treatment, Depo-Medrol, benzonatate, doxycycline to hold.

## 2011-08-11 NOTE — Assessment & Plan Note (Signed)
She continues allergy vaccine without problems. Little acute seasonal discomfort this year.

## 2011-08-11 NOTE — Assessment & Plan Note (Signed)
Good CPAP compliance and control. She needs to talk with her DME company about mask fit. Weight loss would help and she is aware of that.

## 2011-08-24 ENCOUNTER — Ambulatory Visit: Payer: BC Managed Care – PPO | Admitting: Family Medicine

## 2011-08-25 ENCOUNTER — Other Ambulatory Visit: Payer: Self-pay | Admitting: Family Medicine

## 2011-08-27 ENCOUNTER — Telehealth: Payer: Self-pay | Admitting: Family Medicine

## 2011-08-27 MED ORDER — AZITHROMYCIN 250 MG PO TABS: 250.0000 mg | ORAL_TABLET | Freq: Every day | ORAL | Status: DC

## 2011-08-27 NOTE — Telephone Encounter (Signed)
She needs a course of Azithromycin 250 mg tabs 2 tabs po once and then 1 tab po qd x 5 days, disp #6, come in if no improvement

## 2011-08-27 NOTE — Telephone Encounter (Signed)
Sent medication and left a detailed message on patients voicemail

## 2011-08-27 NOTE — Telephone Encounter (Signed)
Please advise? Records show patient had a TDAP on 10-23-2006

## 2011-08-27 NOTE — Telephone Encounter (Signed)
Patient has been around her grandchildren, three of them have confirmed cases of whooping cough. Patient would like to know if there is anything she should do. She did mention she had a bad cough herself a couple of weeks ago. She feels okay now. Please contact her at her work # or if after hours please call her cell.

## 2011-08-28 NOTE — Telephone Encounter (Signed)
Patient called back, she said she's not sure if she really needs to take the antibiotic since she recently finished taking a zpack. She isn't coughing now & she feels okay. 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 Should she take the antibiotic as a precaution? Is she okay to go back into the NICU? She is also concerned about her husband Jillyn Hidden (see phone note on his chart).

## 2011-08-28 NOTE — Telephone Encounter (Signed)
Pt informed and states she called and informed her work she wasn't going to work this week. Patient is going to go ahead and take the zpak to be safe.

## 2011-08-28 NOTE — Telephone Encounter (Signed)
So sent note already but just to make sure if she is better and has already been treated she does not need to treat again unless symptoms worsen

## 2011-09-17 ENCOUNTER — Ambulatory Visit (INDEPENDENT_AMBULATORY_CARE_PROVIDER_SITE_OTHER): Payer: BC Managed Care – PPO | Admitting: Family Medicine

## 2011-09-17 ENCOUNTER — Encounter: Payer: Self-pay | Admitting: Family Medicine

## 2011-09-17 VITALS — BP 98/52 | HR 59 | Temp 97.3°F | Ht 59.0 in | Wt 217.1 lb

## 2011-09-17 DIAGNOSIS — H6690 Otitis media, unspecified, unspecified ear: Secondary | ICD-10-CM

## 2011-09-17 DIAGNOSIS — H6693 Otitis media, unspecified, bilateral: Secondary | ICD-10-CM

## 2011-09-17 DIAGNOSIS — N318 Other neuromuscular dysfunction of bladder: Secondary | ICD-10-CM

## 2011-09-17 DIAGNOSIS — M25561 Pain in right knee: Secondary | ICD-10-CM

## 2011-09-17 DIAGNOSIS — I10 Essential (primary) hypertension: Secondary | ICD-10-CM

## 2011-09-17 DIAGNOSIS — R52 Pain, unspecified: Secondary | ICD-10-CM

## 2011-09-17 DIAGNOSIS — M25569 Pain in unspecified knee: Secondary | ICD-10-CM

## 2011-09-17 DIAGNOSIS — N3281 Overactive bladder: Secondary | ICD-10-CM

## 2011-09-17 DIAGNOSIS — J329 Chronic sinusitis, unspecified: Secondary | ICD-10-CM

## 2011-09-17 MED ORDER — DOXYCYCLINE HYCLATE 100 MG PO TABS: 100.0000 mg | ORAL_TABLET | Freq: Two times a day (BID) | ORAL | Status: AC

## 2011-09-17 MED ORDER — SOLIFENACIN SUCCINATE 10 MG PO TABS: 10.0000 mg | ORAL_TABLET | Freq: Every day | ORAL | Status: DC

## 2011-09-17 MED ORDER — TRAMADOL HCL 50 MG PO TABS: 50.0000 mg | ORAL_TABLET | Freq: Three times a day (TID) | ORAL | Status: AC | PRN

## 2011-09-17 NOTE — Patient Instructions (Addendum)
Knee, Cartilage and its Function The menisci are made of tough cartilage, and fit between the surfaces of the bones upon which they rest. The menisci are semi lunar (C-shaped) and have a wedged profile. The wedged profile helps the stability of the joint by keeping the rounded femur surface from sliding off the flat tibial surface. The menisci are nourished (fed) by small blood vessels; but there is also a large area in the center of the meniscus that does not have a good blood supply (avascular). This presents a problem when there is an injury to the meniscus, as areas without good blood supply heal poorly. When there is a torn cartilage in the knee, surgery is often required to fix this. This is usually done with an arthroscopy (a surgical procedure less invasive than open surgery). Some times open surgery of the knee is required if there is other damage which has occurred. The medial meniscus rests on the medial tibial plateau. The tibia is the large bone in your lower leg (the shin bone). The medial tibial plateau is the upper end of the bone making up the inner part of your knee. The lateral meniscus serves the same purpose and is located on the outside of the knee. The menisci help to distribute your body weight across the knee joint. Without the meniscus present, the weight of your body would be unevenly applied to the bones in your legs (the femur and tibia). The femur is the large bone in your thigh. This uneven weight distribution would cause increased wear and tear on the cartilage covering the bones, leading to early damage of these areas. The presence of the menisci cartilage is necessary for a healthy knee. PURPOSE OF THE KNEE CARTILAGE The knee joint is made up of three bones: the thigh bone (femur), the shin bone (tibia), and the knee cap (patella). The surfaces of these bones at the knee joint are covered with cartilage. This smooth, slippery surface allows the bones to slide against each other  without causing bone damage. The meniscus sits between these cartilaginous surfaces of the bones (femur and tibia). It distributes the weight evenly in the joints and helps with the stability of the joint (keeps the joint steady). HOME CARE INSTRUCTIONS After surgery:  Use crutches as instructed.   Once home, an ice pack applied to your injury may help with discomfort and keep the swelling down. An ice pack can be used for 15 to 20 minutes 3 to 4 times per day for the first 2-3 days or as instructed.   Only take over-the-counter or prescription medicines for pain, discomfort, or fever as directed by your caregiver.   Call if you do not have relief of pain with medications or if there is an increase in pain.   Call if your foot becomes cold or blue.   Call if your knee gets stuck in a fixed position and will not move.   You may resume normal diet and activities as directed.   Make sure to keep your appointment with your follow-up caregiver. This injury may require further evaluation and treatment beyond the treatment you received today.  MAKE SURE YOU:   Understand these instructions.   Will watch your condition.   Will get help right away if you are not doing well or get worse.  Document Released: 06/30/2001 Document Revised: 12/28/2010 Document Reviewed: 11/10/2008 [facility] Patient Information 2012 [place], [place].

## 2011-09-20 ENCOUNTER — Ambulatory Visit: Payer: BC Managed Care – PPO | Admitting: Family Medicine

## 2011-09-20 ENCOUNTER — Encounter: Payer: Self-pay | Admitting: Family Medicine

## 2011-09-20 DIAGNOSIS — M25561 Pain in right knee: Secondary | ICD-10-CM

## 2011-09-20 DIAGNOSIS — H6690 Otitis media, unspecified, unspecified ear: Secondary | ICD-10-CM | POA: Insufficient documentation

## 2011-09-20 HISTORY — DX: Pain in right knee: M25.561

## 2011-09-20 NOTE — Assessment & Plan Note (Signed)
Antibiotics, mucinex, tylenol

## 2011-09-20 NOTE — Progress Notes (Signed)
Patient ID: [name], female   DOB: [birth year], 66 y.o.   MRN: [id number] [name] [id number] 11-10-1945 09/20/2011      Progress Note-Follow Up  Subjective  Chief Complaint  Chief Complaint  Patient presents with  . Knee Pain    right- helping carry husbands scooter pt thinks this has irritated her knee  . 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 This result she has significant increased pain. No significant redness, swelling, fluctuance is noted. It is painful over the medial and anterior aspect mostly of palpable thickening can be painful. Seizure the worst going up and down is painful but now is worse. Also noting some increased nasal congestion with some postnasal drip and some pain in her right ear for the last 2 days. No sore throat, chest pain, palpitations, fevers, chills, GI or GU complaints. She's not had any over-the-counter medications thus far.  Past Medical History  Diagnosis Date  . Obesity   . Esophageal reflux   . Fibromyalgia   . Asthma   . Allergy     rhinitis  . Sleep apnea, obstructive   . Bronchitis, acute 10/31/2010  . Otitis media of both ears 11/16/2010  . Depression with anxiety 01/08/2011  . Back pain 01/08/2011  . Sinusitis 06/11/2011  . Fatigue 06/11/2011  . Knee pain, right 09/20/2011  . Otitis media 09/20/2011    Past Surgical History  Procedure Date  . Bariatric surgery   . Lumbar disc surgery   . Bladder suspension     complicated by bowel nick/ clolostomy  . Gallbladder surgery 1978  . Appendectomy 1962  . Gastric bypass 2003    Patient also noted "gastric bypass resection - 2004"  . Cervical disc surgery     C-7 in 2004, C-4 and C-5 in 2010  . Colostomy bag     Confirm with patient. Listed under medical conditions on form dated 07/18/09.  [name]     Per medical history form dated 07/18/09.  [name] Hernia repair 2009    Two hernias and abdominal reconstruction  . Ivc filter     prophyllactically- no hx DVT    Family History  Problem Relation Age of Onset  . Alcohol abuse Mother   . Other Mother     accidental med overdose  . Emphysema Mother     smoked  . Bipolar disorder Mother   . Other Father     CHF  . Diabetes Father   . Cancer Father     throat ca/ smoked  . Alcohol abuse Father   . Stroke Father   . Hypertension Brother   . Hyperlipidemia Brother   . Obesity Brother   . Heart attack Maternal Grandfather   . Heart attack Paternal Grandmother   . Heart attack Paternal Grandfather   . Arthritis Son     psoriatic  . Arthritis Son     psoriatic  . Obesity Son     History   Social History  . Marital Status: Married    Spouse Name: N/A    Number of Children: N/A  . Years of Education: N/A   Occupational History  . Not on file.   Social History Main Topics  . Smoking status: Never Smoker   .  Smokeless tobacco: Never Used  . Alcohol Use: No     special occasions  . Drug Use: No  . Sexually Active: Yes   Other Topics Concern  . Not on file   Social History Narrative  . No narrative on file    Current Outpatient Prescriptions on File Prior to Visit  Medication Sig Dispense Refill  . ALPRAZolam (XANAX) 0.25 MG tablet Confirm dosage amount of 0.5 indicated on medical history form dated 07/18/09.  She reports she has prescription 0.25mg  tabs, 0.5 mg and 1 mg tabs and uses them alternately at 0.25 mg to 1 mg doses.  1 tablet  0  . bifidobacterium infantis (ALIGN) capsule Take 1 capsule by mouth daily. Confirm patient still taking. Not on history form dated 07/18/09.      . calcitRIOL (ROCALTROL) 0.5 MCG capsule Take 0.5 mcg by mouth daily. Confirm patient still taking. Not on history form dated 07/18/09.      . clonazePAM (KLONOPIN) 1 MG tablet Take 2 mg by mouth at bedtime. Confirm dosage, not  indicated on medical history form dated 07/18/09.      . DULoxetine (CYMBALTA) 60 MG capsule Not on history form dated 07/18/09.  30 mg in am and 60 mg in pm, po  1 capsule  0  . ipratropium (ATROVENT) 0.06 % nasal spray Place 2 sprays into the nose 2 (two) times daily.        [name] lamoTRIgine (LAMICTAL) 100 MG tablet       . levocetirizine (XYZAL) 5 MG tablet Take 1 tablet (5 mg total) by mouth every evening. As needed -antihistamine  30 tablet  prn  . methylphenidate (RITALIN) 10 MG tablet Take 2 tablets (20 mg total) by mouth daily.  30 tablet  0  . montelukast (SINGULAIR) 10 MG tablet Take 1 tablet (10 mg total) by mouth at bedtime.  30 tablet  prn  . NON FORMULARY once a week. Allergy vaccine 1:10 G.O. Confirm patient still taking. Not on history form dated 07/18/09.       . NON FORMULARY CPAP- set on 12 [employer]. Confirm patient still taking. Not on history form dated 07/18/09.       [name] Olopatadine HCl (PATANASE) 0.6 % SOLN Place 2 sprays into the nose as needed. Confirm patient still taking. Not on history form dated 07/18/09.       [name] pantoprazole (PROTONIX) 40 MG tablet Take 1 tablet (40 mg total) by mouth daily.  30 tablet  3  . ranitidine (ZANTAC) 150 MG tablet Take 2 tablets (300 mg total) by mouth at bedtime.  60 tablet  3  . traZODone (DESYREL) 50 MG tablet Take 50 mg by mouth at bedtime.       . TUBERCULIN SYR 1CC/27GX1/2" (B-D TB SYRINGE 1CC/27GX1/2") 27G X 1/2" 1 ML MISC by Does not apply route as directed. Confirm patient still taking. Not on history form dated 07/18/09.       [name] ADVAIR DISKUS 250-50 MCG/DOSE AEPB inhale 1 puff by mouth twice a day  180 each  3  . Olopatadine HCl 0.2 % SOLN Apply 1 drop to eye 2 (two) times daily.  2.5 mL  2  . solifenacin (VESICARE) 10 MG tablet Take 1 tablet (10 mg total) by mouth daily.  30 tablet  5    Allergies  Allergen Reactions  . Cephalexin   . Cephalexin     Ask patient severity and reaction. Not indicated on medical history dated 07/18/09.    [name]  Codeine   . Hydrocodone     Ask patient severity and reaction. Not indicated on medical history dated 07/18/09.  . Levofloxacin   . Meperidine Hcl   . Morphine And Related     Ask patient severity and reaction. Not indicated on medical history dated 07/18/09.  [name] Oxycodone-Acetaminophen   . Penicillins   . Sulfonamide Derivatives   . Vicodin (Hydrocodone-Acetaminophen)     Ask patient severity and reaction. Not indicated on medical history dated 07/18/09.    Review of Systems  Review of Systems  Constitutional: Positive for malaise/fatigue. Negative for fever.  HENT: Positive for ear pain and congestion. Negative for ear discharge.   Eyes: Negative for discharge.  Respiratory: Negative for shortness of breath.   Cardiovascular: Negative for chest pain, palpitations and leg swelling.  Gastrointestinal: Negative for nausea, abdominal pain and diarrhea.  Genitourinary: Negative for dysuria.  Musculoskeletal: Positive for joint pain. Negative for falls.       Right knee pain  Skin: Negative for rash.  Neurological: Positive for weakness. Negative for loss of consciousness and headaches.  Endo/Heme/Allergies: Negative for polydipsia.  Psychiatric/Behavioral: Negative for depression and suicidal ideas. The patient is not nervous/anxious and does not have insomnia.     Objective  BP 98/52  Pulse 59  Temp 97.3 F (36.3 C) (Temporal)  Ht 4\' 11"  (1.499 m)  Wt 217 lb 1.9 oz (98.485 kg)  BMI 43.85 kg/m2  SpO2 96%  Physical Exam  Physical Exam  Constitutional: She is oriented to person, place, and time and well-developed, well-nourished, and in no distress. No distress.  HENT:  Head: Normocephalic and atraumatic.       TMs erythematous b/l dull  Eyes: Conjunctivae are normal.  Neck: Neck supple. No thyromegaly present.  Cardiovascular: Normal rate, regular rhythm and normal heart sounds.   No murmur heard. Pulmonary/Chest: Effort normal and breath sounds normal. She has no  wheezes.  Abdominal: She exhibits no distension and no mass.  Musculoskeletal: She exhibits tenderness. She exhibits no edema.       Right knee tender over medial meniscus  Lymphadenopathy:    She has no cervical adenopathy.  Neurological: She is alert and oriented to person, place, and time. Gait normal.  Skin: Skin is warm and dry. No rash noted. She is not diaphoretic.  Psychiatric: Memory, affect and judgment normal.    Lab Results  Component Value Date   TSH 2.60 06/11/2011   Lab Results  Component Value Date   WBC 4.9 06/11/2011   HGB 12.3 06/11/2011   HCT 37.0 06/11/2011   MCV 92.9 06/11/2011   PLT 213.0 06/11/2011   Lab Results  Component Value Date   CREATININE 0.8 06/11/2011   BUN 16 06/11/2011   NA 143 06/11/2011   K 4.2 06/11/2011   CL 106 06/11/2011   CO2 28 06/11/2011   Lab Results  Component Value Date   ALT 13 07/17/2010   AST 16 07/17/2010   ALKPHOS 110 07/17/2010   BILITOT 0.3 07/17/2010    Assessment & Plan  Knee pain, right Try resting it and elevating the leg, brace the knee, ice and aspercreme, return for xray if symptoms persist  ESSENTIAL HYPERTENSION, BENIGN Mildly low today encouraged to increase her fluid intake  Otitis media of both ears Antibiotics, mucinex, tylenol

## 2011-09-20 NOTE — Assessment & Plan Note (Signed)
Mildly low today encouraged to increase her fluid intake

## 2011-09-20 NOTE — Assessment & Plan Note (Signed)
Try resting it and elevating the leg, brace the knee, ice and aspercreme, return for xray if symptoms persist

## 2011-09-24 ENCOUNTER — Encounter: Payer: Self-pay | Admitting: Family Medicine

## 2011-09-26 ENCOUNTER — Other Ambulatory Visit: Payer: Self-pay | Admitting: Internal Medicine

## 2011-10-01 ENCOUNTER — Encounter: Payer: Self-pay | Admitting: Family Medicine

## 2011-10-01 ENCOUNTER — Ambulatory Visit (INDEPENDENT_AMBULATORY_CARE_PROVIDER_SITE_OTHER)
Admission: RE | Admit: 2011-10-01 | Discharge: 2011-10-01 | Disposition: A | Discharge status: Home / Self Care | Payer: BC Managed Care – PPO | Source: Ambulatory Visit | Attending: Family Medicine | Admitting: Family Medicine

## 2011-10-01 ENCOUNTER — Ambulatory Visit (INDEPENDENT_AMBULATORY_CARE_PROVIDER_SITE_OTHER): Payer: BC Managed Care – PPO | Admitting: Family Medicine

## 2011-10-01 VITALS — BP 141/77 | HR 54 | Temp 97.7°F | Ht 59.0 in | Wt 217.4 lb

## 2011-10-01 DIAGNOSIS — M25569 Pain in unspecified knee: Secondary | ICD-10-CM

## 2011-10-01 DIAGNOSIS — M25561 Pain in right knee: Secondary | ICD-10-CM

## 2011-10-01 DIAGNOSIS — I10 Essential (primary) hypertension: Secondary | ICD-10-CM

## 2011-10-01 DIAGNOSIS — I8390 Asymptomatic varicose veins of unspecified lower extremity: Secondary | ICD-10-CM

## 2011-10-01 NOTE — Assessment & Plan Note (Signed)
Mild elevation with acute pain, no change in meds today 

## 2011-10-01 NOTE — Assessment & Plan Note (Signed)
No improvement with Tramadol prn and trying to rest. Pain is constant but worse with weight bearing, noted anteromedially. Xray confirms moderate medial osteoarthritis. Will refer to ortho for further evaluation if patient is willing. May try Tramadol prn and Voltaren gel prn

## 2011-10-01 NOTE — Progress Notes (Signed)
Patient ID: [name], female   DOB: [birth year], 66 y.o.   MRN: [id number] NAVAE FRANCZAK [id number] [birth year] 10/01/2011      Progress Note-Follow Up  Subjective  Chief Complaint  Chief Complaint  Patient presents with  . medication change    pain medication change- right knee xray    HPI  Is a 66 year old Caucasian female who is in today for reevaluation of knee pain. At her last visit she been struggling with increased knee pain unfortunately since that visit the knee pain is persistent and may be worse. No active,. Tramadol in the past has not tolerated opioids. She denies lesions, shortness of breath, GI or GU complaints noted at today's visit. She continues to struggle with depression and anxiety  Past Medical History  Diagnosis Date  . Obesity   . Esophageal reflux   . Fibromyalgia   . Asthma   . Allergy     rhinitis  . Sleep apnea, obstructive   . Bronchitis, acute 10/31/2010  . Otitis media of both ears 11/16/2010  . Depression with anxiety 01/08/2011  . Back pain 01/08/2011  . Sinusitis 06/11/2011  . Fatigue 06/11/2011  . Knee pain, right 09/20/2011  . Otitis media 09/20/2011    Past Surgical History  Procedure Date  . Bariatric surgery   . Lumbar disc surgery   . Bladder suspension     complicated by bowel nick/ clolostomy  . Gallbladder surgery 1978  . Appendectomy 1962  . Gastric bypass 2003    Patient also noted "gastric bypass resection - 2004"  . Cervical disc surgery     C-7 in 2004, C-4 and C-5 in 2010  . Colostomy bag     Confirm with patient. Listed under medical conditions on form dated 07/18/09.  [name]     Per medical history form dated 07/18/09.  [name] Hernia repair 2009    Two hernias and abdominal reconstruction  . Ivc filter     prophyllactically- no hx DVT    Family History  Problem Relation Age of Onset  . Alcohol abuse Mother   . Other Mother     accidental med overdose  . Emphysema Mother     smoked  . Bipolar disorder  Mother   . Other Father     CHF  . Diabetes Father   . Cancer Father     throat ca/ smoked  . Alcohol abuse Father   . Stroke Father   . Hypertension Brother   . Hyperlipidemia Brother   . Obesity Brother   . Heart attack Maternal Grandfather   . Heart attack Paternal Grandmother   . Heart attack Paternal Grandfather   . Arthritis Son     psoriatic  . Arthritis Son     psoriatic  . Obesity Son     History   Social History  . Marital Status: Married    Spouse Name: N/A    Number of Children: N/A  . Years of Education: N/A   Occupational History  . Not on file.   Social History Main Topics  . Smoking status: Never Smoker   . Smokeless tobacco: Never Used  . Alcohol Use: No     special occasions  . Drug Use: No  . Sexually Active: Yes   Other Topics Concern  . Not on file   Social History Narrative  . No narrative on file    Current Outpatient Prescriptions on File Prior to Visit  Medication Sig Dispense Refill  .  ADVAIR DISKUS 250-50 MCG/DOSE AEPB inhale 1 puff by mouth twice a day  180 each  3  . ALPRAZolam (XANAX) 0.25 MG tablet Confirm dosage amount of 0.5 indicated on medical history form dated 07/18/09.  She reports she has prescription 0.25mg  tabs, 0.5 mg and 1 mg tabs and uses them alternately at 0.25 mg to 1 mg doses.  1 tablet  0  . bifidobacterium infantis (ALIGN) capsule Take 1 capsule by mouth daily. Confirm patient still taking. Not on history form dated 07/18/09.      . calcitRIOL (ROCALTROL) 0.5 MCG capsule Take 0.5 mcg by mouth daily. Confirm patient still taking. Not on history form dated 07/18/09.      . clonazePAM (KLONOPIN) 1 MG tablet Take 2 mg by mouth at bedtime. Confirm dosage, not indicated on medical history form dated 07/18/09.      . DULoxetine (CYMBALTA) 60 MG capsule Not on history form dated 07/18/09.  30 mg in am and 60 mg in pm, po  1 capsule  0  . ipratropium (ATROVENT) 0.06 % nasal spray Place 2 sprays into the nose 2 (two) times  daily.        [name] lamoTRIgine (LAMICTAL) 100 MG tablet       . levocetirizine (XYZAL) 5 MG tablet take 1 tablet by mouth once daily  30 tablet  PRN  . methylphenidate (RITALIN) 10 MG tablet Take 2 tablets (20 mg total) by mouth daily.  30 tablet  0  . montelukast (SINGULAIR) 10 MG tablet Take 1 tablet (10 mg total) by mouth at bedtime.  30 tablet  prn  . NON FORMULARY once a week. Allergy vaccine 1:10 G.O. Confirm patient still taking. Not on history form dated 07/18/09.       . NON FORMULARY CPAP- set on [employer]. Confirm patient still taking. Not on history form dated 07/18/09.       [name] Olopatadine HCl (PATANASE) 0.6 % SOLN Place 2 sprays into the nose as needed. Confirm patient still taking. Not on history form dated 07/18/09.       [name] pantoprazole (PROTONIX) 40 MG tablet Take 1 tablet (40 mg total) by mouth daily.  30 tablet  3  . ranitidine (ZANTAC) 150 MG tablet Take 2 tablets (300 mg total) by mouth at bedtime.  60 tablet  3  . solifenacin (VESICARE) 10 MG tablet Take 1 tablet (10 mg total) by mouth daily.  30 tablet  5  . traZODone (DESYREL) 50 MG tablet Take 50 mg by mouth at bedtime.       . TUBERCULIN SYR 1CC/27GX1/2" (B-D TB SYRINGE 1CC/27GX1/2") 27G X 1/2" 1 ML MISC by Does not apply route as directed. Confirm patient still taking. Not on history form dated 07/18/09.       [name] Olopatadine HCl 0.2 % SOLN Apply 1 drop to eye 2 (two) times daily.  2.5 mL  2    Allergies  Allergen Reactions  . Cephalexin   . Cephalexin     Ask patient severity and reaction. Not indicated on medical history dated 07/18/09.  . Codeine   . Hydrocodone     Ask patient severity and reaction. Not indicated on medical history dated 07/18/09.  . Levofloxacin   . Meperidine Hcl   . Morphine And Related     Ask patient severity and reaction. Not indicated on medical history dated 07/18/09.  [name] Oxycodone-Acetaminophen   . Penicillins   . Sulfonamide Derivatives   . Vicodin (Hydrocodone-Acetaminophen)  Ask  patient severity and reaction. Not indicated on medical history dated 07/18/09.    Review of Systems  Review of Systems  Constitutional: Negative for fever and malaise/fatigue.  HENT: Negative for congestion.   Eyes: Negative for discharge.  Respiratory: Negative for shortness of breath.   Cardiovascular: Negative for chest pain, palpitations and leg swelling.  Gastrointestinal: Negative for nausea, abdominal pain and diarrhea.  Genitourinary: Negative for dysuria.  Musculoskeletal: Positive for joint pain. Negative for falls.       Right medial knee pain  Skin: Negative for rash.  Neurological: Negative for loss of consciousness and headaches.  Endo/Heme/Allergies: Negative for polydipsia.  Psychiatric/Behavioral: Positive for depression. Negative for suicidal ideas. The patient is nervous/anxious and has insomnia.     Objective  BP 141/77  Pulse 54  Temp 97.7 F (36.5 C) (Temporal)  Ht 4\' 11"  (1.499 m)  Wt 217 lb 6.4 oz (98.612 kg)  BMI 43.91 kg/m2  SpO2 96%   Physical Exam  Constitutional: She is oriented to person, place, and time and well-developed, well-nourished, and in no distress. No distress.  HENT:  Head: Normocephalic and atraumatic.  Eyes: Conjunctivae are normal.  Neck: Neck supple. No thyromegaly present.  Cardiovascular: Normal rate, regular rhythm and normal heart sounds.   Pulmonary/Chest: Effort normal and breath sounds normal. She has no wheezes.  Abdominal: She exhibits no distension and no mass.  Musculoskeletal: She exhibits edema and tenderness.       Right knee painful with palp over medial meniscus  Lymphadenopathy:    She has no cervical adenopathy.  Neurological: She is alert and oriented to person, place, and time.  Skin: Skin is warm and dry. No rash noted. She is not diaphoretic.  Psychiatric: Memory, affect and judgment normal.   Physical Exam  Lab Results  Component Value Date   TSH 2.60 06/11/2011   Lab Results  Component Value  Date   WBC 4.9 06/11/2011   HGB 12.3 06/11/2011   HCT 37.0 06/11/2011   MCV 92.9 06/11/2011   PLT 213.0 06/11/2011   Lab Results  Component Value Date   CREATININE 0.8 06/11/2011   BUN 16 06/11/2011   NA 143 06/11/2011   K 4.2 06/11/2011   CL 106 06/11/2011   CO2 28 06/11/2011   Lab Results  Component Value Date   ALT 13 07/17/2010   AST 16 07/17/2010   ALKPHOS 110 07/17/2010   BILITOT 0.3 07/17/2010     Assessment & Plan  Knee pain, right No improvement with Tramadol prn and trying to rest. Pain is constant but worse with weight bearing, noted anteromedially. Xray confirms moderate medial osteoarthritis. Will refer to ortho for further evaluation if patient is willing. May try Tramadol prn and Voltaren gel prn  ESSENTIAL HYPERTENSION, BENIGN Mild elevation with acute pain, no change in meds today

## 2011-10-01 NOTE — Patient Instructions (Addendum)
Knee Pain The knee is the complex joint between your thigh and your lower leg. It is made up of bones, tendons, ligaments, and cartilage. The bones that make up the knee are:  The femur in the thigh.   The tibia and fibula in the lower leg.   The patella or kneecap riding in the groove on the lower femur.  CAUSES  Knee pain is a common complaint with many causes. A few of these causes are:  Injury, such as:   A ruptured ligament or tendon injury.   Torn cartilage.   Medical conditions, such as:   Gout   Arthritis   Infections   Overuse, over training or overdoing a physical activity.  Knee pain can be minor or severe. Knee pain can accompany debilitating injury. Minor knee problems often respond well to self-care measures or get well on their own. More serious injuries may need medical intervention or even surgery. SYMPTOMS The knee is complex. Symptoms of knee problems can vary widely. Some of the problems are:  Pain with movement and weight bearing.   Swelling and tenderness.   Buckling of the knee.   Inability to straighten or extend your knee.   Your knee locks and you cannot straighten it.   Warmth and redness with pain and fever.   Deformity or dislocation of the kneecap.  DIAGNOSIS  Determining what is wrong may be very straight forward such as when there is an injury. It can also be challenging because of the complexity of the knee. Tests to make a diagnosis may include:  Your caregiver taking a history and doing a physical exam.   Routine X-rays can be used to rule out other problems. X-rays will not reveal a cartilage tear. Some injuries of the knee can be diagnosed by:   Arthroscopy a surgical technique by which a small video camera is inserted through tiny incisions on the sides of the knee. This procedure is used to examine and repair internal knee joint problems. Tiny instruments can be used during arthroscopy to repair the torn knee cartilage  (meniscus).   Arthrography is a radiology technique. A contrast liquid is directly injected into the knee joint. Internal structures of the knee joint then become visible on X-ray film.   An MRI scan is a non x-ray radiology procedure in which magnetic fields and a computer produce two- or three-dimensional images of the inside of the knee. Cartilage tears are often visible using an MRI scanner. MRI scans have largely replaced arthrography in diagnosing cartilage tears of the knee.   Blood work.   Examination of the fluid that helps to lubricate the knee joint (synovial fluid). This is done by taking a sample out using a needle and a syringe.  TREATMENT The treatment of knee problems depends on the cause. Some of these treatments are:  Depending on the injury, proper casting, splinting, surgery or physical therapy care will be needed.   Give yourself adequate recovery time. Do not overuse your joints. If you begin to get sore during workout routines, back off. Slow down or do fewer repetitions.   For repetitive activities such as cycling or running, maintain your strength and nutrition.   Alternate muscle groups. For example if you are a weight lifter, work the upper body on one day and the lower body the next.   Either tight or weak muscles do not give the proper support for your knee. Tight or weak muscles do not absorb the stress placed   on the knee joint. Keep the muscles surrounding the knee strong.   Take care of mechanical problems.   If you have flat feet, orthotics or special shoes may help. See your caregiver if you need help.   Arch supports, sometimes with wedges on the inner or outer aspect of the heel, can help. These can shift pressure away from the side of the knee most bothered by osteoarthritis.   A brace called an "unloader" brace also may be used to help ease the pressure on the most arthritic side of the knee.   If your caregiver has prescribed crutches, braces,  wraps or ice, use as directed. The acronym for this is PRICE. This means protection, rest, ice, compression and elevation.   Nonsteroidal anti-inflammatory drugs (NSAID's), can help relieve pain. But if taken immediately after an injury, they may actually increase swelling. Take NSAID's with food in your stomach. Stop them if you develop stomach problems. Do not take these if you have a history of ulcers, stomach pain or bleeding from the bowel. Do not take without your caregiver's approval if you have problems with fluid retention, heart failure, or kidney problems.   For ongoing knee problems, physical therapy may be helpful.   Glucosamine and chondroitin are over-the-counter dietary supplements. Both may help relieve the pain of osteoarthritis in the knee. These medicines are different from the usual anti-inflammatory drugs. Glucosamine may decrease the rate of cartilage destruction.   Injections of a corticosteroid drug into your knee joint may help reduce the symptoms of an arthritis flare-up. They may provide pain relief that lasts a few months. You may have to wait a few months between injections. The injections do have a small increased risk of infection, water retention and elevated blood sugar levels.   Hyaluronic acid injected into damaged joints may ease pain and provide lubrication. These injections may work by reducing inflammation. A series of shots may give relief for as long as 6 months.   Topical painkillers. Applying certain ointments to your skin may help relieve the pain and stiffness of osteoarthritis. Ask your pharmacist for suggestions. Many over the-counter products are approved for temporary relief of arthritis pain.   In some countries, doctors often prescribe topical NSAID's for relief of chronic conditions such as arthritis and tendinitis. A review of treatment with NSAID creams found that they worked as well as oral medications but without the serious side effects.    PREVENTION  Maintain a healthy weight. Extra pounds put more strain on your joints.   Get strong, stay limber. Weak muscles are a common cause of knee injuries. Stretching is important. Include flexibility exercises in your workouts.   Be smart about exercise. If you have osteoarthritis, chronic knee pain or recurring injuries, you may need to change the way you exercise. This does not mean you have to stop being active. If your knees ache after jogging or playing basketball, consider switching to swimming, water aerobics or other low-impact activities, at least for a few days a week. Sometimes limiting high-impact activities will provide relief.   Make sure your shoes fit well. Choose footwear that is right for your sport.   Protect your knees. Use the proper gear for knee-sensitive activities. Use kneepads when playing volleyball or laying carpet. Buckle your seat belt every time you drive. Most shattered kneecaps occur in car accidents.   Rest when you are tired.  SEEK MEDICAL CARE IF:  You have knee pain that is continual and does not   seem to be getting better.  SEEK IMMEDIATE MEDICAL CARE IF:  Your knee joint feels hot to the touch and you have a high fever. MAKE SURE YOU:   Understand these instructions.   Will watch your condition.   Will get help right away if you are not doing well or get worse.  Document Released: 11/05/2006 Document Revised: 12/28/2010 Document Reviewed: 11/05/2006 [facility] Patient Information 2012 [place], [place]. Voltaren gel 4 x day

## 2011-10-02 ENCOUNTER — Other Ambulatory Visit: Payer: Self-pay | Admitting: Family Medicine

## 2011-10-02 ENCOUNTER — Encounter: Payer: Self-pay | Admitting: Family Medicine

## 2011-10-02 DIAGNOSIS — I8390 Asymptomatic varicose veins of unspecified lower extremity: Secondary | ICD-10-CM

## 2011-10-02 DIAGNOSIS — M25562 Pain in left knee: Secondary | ICD-10-CM

## 2011-10-02 HISTORY — DX: Asymptomatic varicose veins of unspecified lower extremity: I83.90

## 2011-10-02 NOTE — Addendum Note (Signed)
Addended by: Danise Edge A on: 10/02/2011 01:10 PM   Modules accepted: Orders

## 2011-10-02 NOTE — Assessment & Plan Note (Signed)
Recurrent and inflamed right knee, start compression hose on in am off in pm, 15-25 mmhg and refer to vascular for further evaluation

## 2011-10-02 NOTE — Progress Notes (Signed)
Patient ID: [name], female   DOB: 1945/05/20, 66 y.o.   MRN: [id number] PE tender varicosities, medial right knee, no redness or warmth  A/P Varicose veins: right LE: referred back to vascular

## 2011-10-02 NOTE — Progress Notes (Signed)
Quick Note:  Patient Informed and voiced understanding.  Pt would like to proceed with the ortho referral.  Pt also stated that she would like to know what she should do about the veins that hurt in her leg? Please advise ______

## 2011-10-29 ENCOUNTER — Ambulatory Visit (INDEPENDENT_AMBULATORY_CARE_PROVIDER_SITE_OTHER): Payer: BC Managed Care – PPO | Admitting: Family Medicine

## 2011-10-29 ENCOUNTER — Encounter: Payer: Self-pay | Admitting: Family Medicine

## 2011-10-29 VITALS — BP 133/72 | HR 59 | Temp 98.4°F | Ht 59.0 in | Wt 217.0 lb

## 2011-10-29 DIAGNOSIS — M25569 Pain in unspecified knee: Secondary | ICD-10-CM

## 2011-10-29 DIAGNOSIS — H6692 Otitis media, unspecified, left ear: Secondary | ICD-10-CM

## 2011-10-29 DIAGNOSIS — I10 Essential (primary) hypertension: Secondary | ICD-10-CM

## 2011-10-29 DIAGNOSIS — R52 Pain, unspecified: Secondary | ICD-10-CM

## 2011-10-29 DIAGNOSIS — M25561 Pain in right knee: Secondary | ICD-10-CM

## 2011-10-29 DIAGNOSIS — H6690 Otitis media, unspecified, unspecified ear: Secondary | ICD-10-CM

## 2011-10-29 MED ORDER — DICLOFENAC SODIUM 3 % TD GEL: TRANSDERMAL | Status: DC

## 2011-10-29 MED ORDER — GUAIFENESIN ER 600 MG PO TB12: 600.0000 mg | ORAL_TABLET | Freq: Two times a day (BID) | ORAL | Status: DC

## 2011-10-29 MED ORDER — DOXYCYCLINE HYCLATE 100 MG PO TABS: 100.0000 mg | ORAL_TABLET | Freq: Two times a day (BID) | ORAL | Status: DC

## 2011-10-29 NOTE — Patient Instructions (Addendum)

## 2011-10-29 NOTE — Assessment & Plan Note (Signed)
Well controlled despite pain, no changes 

## 2011-10-29 NOTE — Assessment & Plan Note (Signed)
Restarted on Doxycycline 100 mg po bid with Mucinex bid x 14 days and call if no improvement. Patient would like to avoid referral to ENT is she is able

## 2011-10-29 NOTE — Progress Notes (Signed)
Patient ID: [name], female   DOB: 1945-02-04, 66 y.o.   MRN: [id number] [name] [id number] Dec 26, 1945 10/29/2011      Progress Note-Follow Up  Subjective  Chief Complaint  Chief Complaint  Patient presents with  . Jaw Pain    ear pain, left side more than right    HPI  Patient is a 66 year old Caucasian female who is in today with some concerns. Her ear pain is worsened once again. Left is worse than right. She notes anterior to her ear in the jaw she had significant pain yesterday to the point was difficult to open her mouth. She's got some popping in her ears as well. No obvious fevers chills but there is some nasal congestion with some postnasal drip noted no sore throat no epistaxis no chest pain, palpitations, shortness of breath, GI or GU complaints. She has been seen at [facility] or so they did give her a steroid injection in her left knee. They did recommend replacement but for now she would prefer not to proceed. A steroid injection helped her knee unfortunately she's been having more pain in her left knee and her right hip since then. Walking is increasingly uncomfortable.  Past Medical History  Diagnosis Date  . Obesity   . Esophageal reflux   . Fibromyalgia   . Asthma   . Allergy     rhinitis  . Sleep apnea, obstructive   . Bronchitis, acute 10/31/2010  . Otitis media of both ears 11/16/2010  . Depression with anxiety 01/08/2011  . Back pain 01/08/2011  . Sinusitis 06/11/2011  . Fatigue 06/11/2011  . Knee pain, right 09/20/2011  . Otitis media 09/20/2011  . Varicose veins of lower limb 10/02/2011  . Otitis media of left ear 09/19/2010    Past Surgical History  Procedure Date  . Bariatric surgery   . Lumbar disc surgery   . Bladder suspension     complicated by bowel nick/ clolostomy  . Gallbladder surgery 1978  . Appendectomy 1962  . Gastric bypass 2003    Patient also noted "gastric bypass resection - 2004"  . Cervical disc surgery     C-7 in  2004, C-4 and C-5 in 2010  . Colostomy bag     Confirm with patient. Listed under medical conditions on form dated 07/18/09.  [name]     Per medical history form dated 07/18/09.  [name] Hernia repair 2009    Two hernias and abdominal reconstruction  . Ivc filter     prophyllactically- no hx DVT    Family History  Problem Relation Age of Onset  . Alcohol abuse Mother   . Other Mother     accidental med overdose  . Emphysema Mother     smoked  . Bipolar disorder Mother   . Other Father     CHF  . Diabetes Father   . Cancer Father     throat ca/ smoked  . Alcohol abuse Father   . Stroke Father   . Hypertension Brother   . Hyperlipidemia Brother   . Obesity Brother   . Heart attack Maternal Grandfather   . Heart attack Paternal Grandmother   . Heart attack Paternal Grandfather   . Arthritis Son     psoriatic  . Arthritis Son     psoriatic  . Obesity Son     History   Social History  . Marital Status: Married    Spouse Name: N/A    Number of Children:  N/A  . Years of Education: N/A   Occupational History  . Not on file.   Social History Main Topics  . Smoking status: Never Smoker   . Smokeless tobacco: Never Used  . Alcohol Use: No     special occasions  . Drug Use: No  . Sexually Active: Yes   Other Topics Concern  . Not on file   Social History Narrative  . No narrative on file    Current Outpatient Prescriptions on File Prior to Visit  Medication Sig Dispense Refill  . ADVAIR DISKUS 250-50 MCG/DOSE AEPB inhale 1 puff by mouth twice a day  180 each  3  . ALPRAZolam (XANAX) 0.25 MG tablet Confirm dosage amount of 0.5 indicated on medical history form dated 07/18/09.  She reports she has prescription 0.25mg  tabs, 0.5 mg and 1 mg tabs and uses them alternately at 0.25 mg to 1 mg doses.  1 tablet  0  . bifidobacterium infantis (ALIGN) capsule Take 1 capsule by mouth daily. Confirm patient still taking. Not on history form dated 07/18/09.      .  calcitRIOL (ROCALTROL) 0.5 MCG capsule Take 0.5 mcg by mouth daily. Confirm patient still taking. Not on history form dated 07/18/09.      . clonazePAM (KLONOPIN) 1 MG tablet Take 2 mg by mouth at bedtime. Confirm dosage, not indicated on medical history form dated 07/18/09.      . DULoxetine (CYMBALTA) 60 MG capsule Not on history form dated 07/18/09.  30 mg in am and 60 mg in pm, po  1 capsule  0  . ipratropium (ATROVENT) 0.06 % nasal spray Place 2 sprays into the nose 2 (two) times daily.        [name] lamoTRIgine (LAMICTAL) 100 MG tablet       . levocetirizine (XYZAL) 5 MG tablet take 1 tablet by mouth once daily  30 tablet  PRN  . methylphenidate (RITALIN) 10 MG tablet Take 2 tablets (20 mg total) by mouth daily.  30 tablet  0  . montelukast (SINGULAIR) 10 MG tablet Take 1 tablet (10 mg total) by mouth at bedtime.  30 tablet  prn  . NON FORMULARY once a week. Allergy vaccine 1:10 G.O. Confirm patient still taking. Not on history form dated 07/18/09.       . NON FORMULARY CPAP- set on 12 [employer]. Confirm patient still taking. Not on history form dated 07/18/09.       [name] Olopatadine HCl (PATANASE) 0.6 % SOLN Place 2 sprays into the nose as needed. Confirm patient still taking. Not on history form dated 07/18/09.       [name] Olopatadine HCl 0.2 % SOLN Apply 1 drop to eye 2 (two) times daily.  2.5 mL  2  . pantoprazole (PROTONIX) 40 MG tablet Take 1 tablet (40 mg total) by mouth daily.  30 tablet  3  . ranitidine (ZANTAC) 150 MG tablet Take 2 tablets (300 mg total) by mouth at bedtime.  60 tablet  3  . solifenacin (VESICARE) 10 MG tablet Take 1 tablet (10 mg total) by mouth daily.  30 tablet  5  . traZODone (DESYREL) 50 MG tablet Take 50 mg by mouth at bedtime.       . TUBERCULIN SYR 1CC/27GX1/2" (B-D TB SYRINGE 1CC/27GX1/2") 27G X 1/2" 1 ML MISC by Does not apply route as directed. Confirm patient still taking. Not on history form dated 07/18/09.         Allergies  Allergen Reactions  .  Cephalexin   .  Cephalexin     Ask patient severity and reaction. Not indicated on medical history dated 07/18/09.  . Codeine   . Hydrocodone     Ask patient severity and reaction. Not indicated on medical history dated 07/18/09.  . Levofloxacin   . Meperidine Hcl   . Morphine And Related     Ask patient severity and reaction. Not indicated on medical history dated 07/18/09.  [name] Oxycodone-Acetaminophen   . Penicillins   . Sulfonamide Derivatives   . Vicodin (Hydrocodone-Acetaminophen)     Ask patient severity and reaction. Not indicated on medical history dated 07/18/09.    Review of Systems  Review of Systems  Constitutional: Negative for fever and malaise/fatigue.  HENT: Positive for ear pain and congestion. Negative for nosebleeds, sore throat and tinnitus.   Eyes: Negative for discharge.  Respiratory: Negative for cough, sputum production, shortness of breath and wheezing.   Cardiovascular: Negative for chest pain, palpitations and leg swelling.  Gastrointestinal: Negative for nausea, abdominal pain and diarrhea.  Genitourinary: Negative for dysuria.  Musculoskeletal: Positive for myalgias and joint pain. Negative for falls.       B/l knee pain, right hip pain, TMJ pain r>l  Skin: Negative for rash.  Neurological: Negative for loss of consciousness and headaches.  Endo/Heme/Allergies: Negative for polydipsia.  Psychiatric/Behavioral: Negative for depression and suicidal ideas. The patient is not nervous/anxious and does not have insomnia.     Objective  BP 133/72  Pulse 59  Temp 98.4 F (36.9 C) (Temporal)  Ht 4\' 11"  (1.499 m)  Wt 217 lb (98.431 kg)  BMI 43.83 kg/m2  SpO2 95%  Physical Exam  Physical Exam  Constitutional: She is oriented to person, place, and time and well-developed, well-nourished, and in no distress. No distress.  HENT:  Head: Normocephalic and atraumatic.       Left TM erythematous and retracted. Right TM clear. Left posterior auricular lymphadenopathy, tender to  palp noted.  Eyes: Conjunctivae normal are normal.  Neck: Neck supple. No thyromegaly present.  Cardiovascular: Normal rate, regular rhythm and normal heart sounds.   No murmur heard. Pulmonary/Chest: Effort normal and breath sounds normal. She has no wheezes.  Abdominal: She exhibits no distension and no mass.  Musculoskeletal: She exhibits no edema.  Lymphadenopathy:    She has no cervical adenopathy.  Neurological: She is alert and oriented to person, place, and time.  Skin: Skin is warm and dry. No rash noted. She is not diaphoretic.  Psychiatric: Memory, affect and judgment normal.    Lab Results  Component Value Date   TSH 2.60 06/11/2011   Lab Results  Component Value Date   WBC 4.9 06/11/2011   HGB 12.3 06/11/2011   HCT 37.0 06/11/2011   MCV 92.9 06/11/2011   PLT 213.0 06/11/2011   Lab Results  Component Value Date   CREATININE 0.8 06/11/2011   BUN 16 06/11/2011   NA 143 06/11/2011   K 4.2 06/11/2011   CL 106 06/11/2011   CO2 28 06/11/2011   Lab Results  Component Value Date   ALT 13 07/17/2010   AST 16 07/17/2010   ALKPHOS 110 07/17/2010   BILITOT 0.3 07/17/2010     Assessment & Plan  Otitis media of left ear Restarted on Doxycycline 100 mg po bid with Mucinex bid x 14 days and call if no improvement. Patient would like to avoid referral to ENT is she is able  Knee pain, right Has been seen by orthopedics  found to have a steroid injection in her right knee. Knees this is somewhat better but now she's having a lot of trouble with right hip pain with radicular symptoms. She is given a prescription for transdermal compounded advanced sports formula with diclofenac, baclofen, orphenadrine and bupivacaine in it. She may try this 3-4 times daily and if no improvement she will let us know  ESSENTIAL HYPERTENSION, BENIGN Well controlled despite pain, no changes

## 2011-10-29 NOTE — Assessment & Plan Note (Signed)
Has been seen by orthopedics found to have a steroid injection in her right knee. Knees this is somewhat better but now she's having a lot of trouble with right hip pain with radicular symptoms. She is given a prescription for transdermal compounded advanced sports formula with diclofenac, baclofen, orphenadrine and bupivacaine in it. She may try this 3-4 times daily and if no improvement she will let us know

## 2011-11-12 ENCOUNTER — Other Ambulatory Visit: Payer: Self-pay | Admitting: Internal Medicine

## 2011-12-01 ENCOUNTER — Other Ambulatory Visit: Payer: Self-pay | Admitting: Family Medicine

## 2011-12-03 NOTE — Telephone Encounter (Signed)
eScribe request for refill on PANTOPRAZOLE Last seen on - 10/29/11 Follow up - NOT NOTED Refill sent per [facility] refill protocol.

## 2011-12-07 ENCOUNTER — Emergency Department (HOSPITAL_COMMUNITY)
Admission: EM | Admit: 2011-12-07 | Discharge: 2011-12-07 | Disposition: A | Discharge status: Home / Self Care | Payer: BC Managed Care – PPO | Attending: Emergency Medicine | Admitting: Emergency Medicine

## 2011-12-07 ENCOUNTER — Emergency Department (HOSPITAL_COMMUNITY): Payer: BC Managed Care – PPO

## 2011-12-07 ENCOUNTER — Telehealth: Payer: Self-pay | Admitting: Family Medicine

## 2011-12-07 ENCOUNTER — Encounter (HOSPITAL_COMMUNITY): Payer: Self-pay | Admitting: Emergency Medicine

## 2011-12-07 DIAGNOSIS — Z79899 Other long term (current) drug therapy: Secondary | ICD-10-CM | POA: Insufficient documentation

## 2011-12-07 DIAGNOSIS — IMO0001 Reserved for inherently not codable concepts without codable children: Secondary | ICD-10-CM | POA: Insufficient documentation

## 2011-12-07 DIAGNOSIS — K219 Gastro-esophageal reflux disease without esophagitis: Secondary | ICD-10-CM | POA: Insufficient documentation

## 2011-12-07 DIAGNOSIS — E669 Obesity, unspecified: Secondary | ICD-10-CM | POA: Insufficient documentation

## 2011-12-07 DIAGNOSIS — J309 Allergic rhinitis, unspecified: Secondary | ICD-10-CM | POA: Insufficient documentation

## 2011-12-07 DIAGNOSIS — R32 Unspecified urinary incontinence: Secondary | ICD-10-CM | POA: Insufficient documentation

## 2011-12-07 DIAGNOSIS — J329 Chronic sinusitis, unspecified: Secondary | ICD-10-CM | POA: Insufficient documentation

## 2011-12-07 DIAGNOSIS — G4730 Sleep apnea, unspecified: Secondary | ICD-10-CM | POA: Insufficient documentation

## 2011-12-07 DIAGNOSIS — F341 Dysthymic disorder: Secondary | ICD-10-CM | POA: Insufficient documentation

## 2011-12-07 LAB — COMPREHENSIVE METABOLIC PANEL
ALT: 12 U/L (ref 0–35)
AST: 15 U/L (ref 0–37)
Albumin: 3.7 g/dL (ref 3.5–5.2)
Calcium: 8.5 mg/dL (ref 8.4–10.5)
Creatinine, Ser: 0.83 mg/dL (ref 0.50–1.10)
Sodium: 139 mEq/L (ref 135–145)
Total Protein: 6.4 g/dL (ref 6.0–8.3)

## 2011-12-07 LAB — CBC WITH DIFFERENTIAL/PLATELET
Basophils Absolute: 0 10*3/uL (ref 0.0–0.1)
Basophils Relative: 1 % (ref 0–1)
Eosinophils Absolute: 0.2 10*3/uL (ref 0.0–0.7)
Eosinophils Relative: 3 % (ref 0–5)
Lymphocytes Relative: 29 % (ref 12–46)
MCH: 30.9 pg (ref 26.0–34.0)
MCHC: 33.8 g/dL (ref 30.0–36.0)
MCV: 91.5 fL (ref 78.0–100.0)
Platelets: 232 10*3/uL (ref 150–400)
RDW: 13.5 % (ref 11.5–15.5)
WBC: 6.9 10*3/uL (ref 4.0–10.5)

## 2011-12-07 LAB — URINALYSIS, ROUTINE W REFLEX MICROSCOPIC
Bilirubin Urine: NEGATIVE
Hgb urine dipstick: NEGATIVE
Specific Gravity, Urine: 1.021 (ref 1.005–1.030)
pH: 5.5 (ref 5.0–8.0)

## 2011-12-07 LAB — PROTIME-INR
INR: 0.98 (ref 0.00–1.49)
Prothrombin Time: 12.9 seconds (ref 11.6–15.2)

## 2011-12-07 MED ORDER — ONDANSETRON HCL 4 MG/2ML IJ SOLN
4.0000 mg | Freq: Once | INTRAMUSCULAR | Status: AC
  Administered 2011-12-07: 4 mg via INTRAVENOUS
  Filled 2011-12-07: qty 2

## 2011-12-07 MED ORDER — HYDROMORPHONE HCL PF 1 MG/ML IJ SOLN
1.0000 mg | Freq: Once | INTRAMUSCULAR | Status: AC
  Administered 2011-12-07: 1 mg via INTRAVENOUS
  Filled 2011-12-07: qty 1

## 2011-12-07 MED ORDER — PREDNISONE 10 MG PO TABS: ORAL_TABLET | ORAL | Status: DC

## 2011-12-07 MED ORDER — GADOBENATE DIMEGLUMINE 529 MG/ML IV SOLN
20.0000 mL | Freq: Once | INTRAVENOUS | Status: AC | PRN
  Administered 2011-12-07: 20 mL via INTRAVENOUS

## 2011-12-07 NOTE — ED Notes (Addendum)
Pt c/o of right leg pain that was diagnosed by [facility] as sciatic nerve pain. Pt states pain has gotten worse to the point where she looses her bowel and urine.

## 2011-12-07 NOTE — ED Notes (Signed)
Patient transported to MRI 

## 2011-12-07 NOTE — ED Provider Notes (Signed)
History     CSN: [id number]  Arrival date & time 12/07/11  1636   First MD Initiated Contact with Patient 12/07/11 1739      Chief Complaint  Patient presents with  . Leg Pain    (Consider location/radiation/quality/duration/timing/severity/associated sxs/prior treatment) HPI  [name] is a 66 y.o. female complaining of right low back pain that radiates down the right leg this is chronic for her has been worsening over the course of several months. She was sent by her primary care doctor after she disclosed bowel and bladder incontinence for the past 2 weeks. Patient is followed by neurosurgeon Dr. Jordan Likes. She denies any trauma, numbness or paresthesia difficulty ambulating, weakness. She does report feeling more unsteady ambulating over the last month. Patient has a history of urge incontinence however, she reports that her urinary incontinence is like a "faucet being turned on."   Past Medical History  Diagnosis Date  . Obesity   . Esophageal reflux   . Fibromyalgia   . Asthma   . Allergy     rhinitis  . Sleep apnea, obstructive   . Bronchitis, acute 10/31/2010  . Otitis media of both ears 11/16/2010  . Depression with anxiety 01/08/2011  . Back pain 01/08/2011  . Sinusitis 06/11/2011  . Fatigue 06/11/2011  . Knee pain, right 09/20/2011  . Otitis media 09/20/2011  . Varicose veins of lower limb 10/02/2011  . Otitis media of left ear 09/19/2010    Past Surgical History  Procedure Date  . Bariatric surgery   . Lumbar disc surgery   . Bladder suspension     complicated by bowel nick/ clolostomy  . Gallbladder surgery 1978  . Appendectomy 1962  . Gastric bypass 2003    Patient also noted "gastric bypass resection - 2004"  . Cervical disc surgery     C-7 in 2004, C-4 and C-5 in 2010  . Colostomy bag     Confirm with patient. Listed under medical conditions on form dated 07/18/09.  [name]     Per medical history form dated 07/18/09.  [name] Hernia repair 2009   Two hernias and abdominal reconstruction  . Ivc filter     prophyllactically- no hx DVT    Family History  Problem Relation Age of Onset  . Alcohol abuse Mother   . Other Mother     accidental med overdose  . Emphysema Mother     smoked  . Bipolar disorder Mother   . Other Father     CHF  . Diabetes Father   . Cancer Father     throat ca/ smoked  . Alcohol abuse Father   . Stroke Father   . Hypertension Brother   . Hyperlipidemia Brother   . Obesity Brother   . Heart attack Maternal Grandfather   . Heart attack Paternal Grandmother   . Heart attack Paternal Grandfather   . Arthritis Son     psoriatic  . Arthritis Son     psoriatic  . Obesity Son     History  Substance Use Topics  . Smoking status: Never Smoker   . Smokeless tobacco: Never Used  . Alcohol Use: No     Comment: special occasions    OB History    Grav Para Term Preterm Abortions TAB SAB Ect Mult Living                  Review of Systems  Constitutional: Negative for fever.  Respiratory: Negative for shortness  of breath.   Cardiovascular: Negative for chest pain.  Gastrointestinal: Negative for nausea, vomiting, abdominal pain and diarrhea.  Genitourinary:       Urinary and bowel incontinence  Musculoskeletal: Positive for back pain.  All other systems reviewed and are negative.    Allergies  Cephalexin; Cephalexin; Codeine; Hydrocodone; Levofloxacin; Meperidine hcl; Morphine and related; Oxycodone-acetaminophen; Penicillins; Sulfonamide derivatives; and Vicodin  Home Medications   Current Outpatient Rx  Name  Route  Sig  Dispense  Refill  . ADVAIR DISKUS 250-50 MCG/DOSE IN AEPB      inhale 1 puff by mouth twice a day   180 each   3   . ALPRAZOLAM 0.25 MG PO TABS      Confirm dosage amount of 0.5 indicated on medical history form dated 07/18/09.  She reports she has prescription 0.25mg  tabs, 0.5 mg and 1 mg tabs and uses them alternately at 0.25 mg to 1 mg doses.   1 tablet    0   . ALIGN PO CAPS   Oral   Take 1 capsule by mouth daily.          [name] CALCITRIOL 0.5 MCG PO CAPS   Oral   Take 0.5 mcg by mouth daily. Confirm patient still taking. Not on history form dated 07/18/09.         [name] CLONAZEPAM 1 MG PO TABS   Oral   Take 2 mg by mouth at bedtime. Confirm dosage, not indicated on medical history form dated 07/18/09.         . DULOXETINE HCL 60 MG PO CPEP      Not on history form dated 07/18/09.  30 mg in am and 60 mg in pm, po   1 capsule   0   . IPRATROPIUM BROMIDE 0.06 % NA SOLN   Nasal   Place 2 sprays into the nose 2 (two) times daily.           [name] LAMOTRIGINE 100 MG PO TABS   Oral   Take 100 mg by mouth at bedtime.          [name] LEVOCETIRIZINE DIHYDROCHLORIDE 5 MG PO TABS   Oral   Take 5 mg by mouth every evening.         . METHYLPHENIDATE HCL 10 MG PO TABS   Oral   Take 2 tablets (20 mg total) by mouth daily.   30 tablet   0   . MONTELUKAST SODIUM 10 MG PO TABS      take 1 tablet by mouth once daily   30 tablet   4   . NON FORMULARY      once a week. Allergy vaccine 1:10 G.O. Confirm patient still taking. Not on history form dated 07/18/09.         . NON FORMULARY      CPAP- set on 12 [employer]. Confirm patient still taking. Not on history form dated 07/18/09.         [name] OLOPATADINE HCL 0.6 % NA SOLN   Nasal   Place 2 sprays into the nose as needed. Confirm patient still taking. Not on history form dated 07/18/09.          [name] PANTOPRAZOLE SODIUM 40 MG PO TBEC      take 1 tablet by mouth once daily   30 tablet   5   . RANITIDINE HCL 150 MG PO TABS   Oral   Take 2 tablets (300 mg total) by mouth  at bedtime.   60 tablet   3   . SOLIFENACIN SUCCINATE 10 MG PO TABS   Oral   Take 1 tablet (10 mg total) by mouth daily.   30 tablet   5   . TRAZODONE HCL 50 MG PO TABS   Oral   Take 50 mg by mouth at bedtime.          . TUBERCULIN SYRINGE 27G X 1/2" 1 ML MISC   Does not apply   by Does not apply route as  directed. Confirm patient still taking. Not on history form dated 07/18/09.         [name] MONTELUKAST SODIUM 10 MG PO TABS   Oral   Take 1 tablet (10 mg total) by mouth at bedtime.   30 tablet   prn     BP 143/58  Pulse 63  Temp 98.2 F (36.8 C) (Oral)  Resp 18  SpO2 96%  Physical Exam  Nursing note and vitals reviewed. Constitutional: She is oriented to person, place, and time. She appears well-developed and well-nourished. No distress.  HENT:  Head: Normocephalic and atraumatic.  Mouth/Throat: Oropharynx is clear and moist.  Eyes: Conjunctivae normal and EOM are normal. Pupils are equal, round, and reactive to light.  Neck: Normal range of motion.  Cardiovascular: Normal rate.   Pulmonary/Chest: Effort normal and breath sounds normal. No stridor.  Abdominal: Soft. Bowel sounds are normal.  Genitourinary:       Poor rectal tone on DRE. Guaiac negative  Musculoskeletal: Normal range of motion.  Neurological: She is alert and oriented to person, place, and time. She displays normal reflexes.       No saddle anesthesia. Strength is 5 out of 5x4 extremities. Patient was not ambulated. Lower extremity reflexes normal bilaterally.  Skin: Skin is warm.  Psychiatric: She has a normal mood and affect.    ED Course  Procedures (including critical care time)  Labs Reviewed  CBC WITH DIFFERENTIAL - Abnormal; Notable for the following:    HCT 35.5 (*)     All other components within normal limits  COMPREHENSIVE METABOLIC PANEL - Abnormal; Notable for the following:    Potassium 3.4 (*)     Glucose, Bld 105 (*)     Total Bilirubin 0.2 (*)     GFR calc non Af Amer 73 (*)     GFR calc Af Amer 85 (*)     All other components within normal limits  URINALYSIS, ROUTINE W REFLEX MICROSCOPIC  PROTIME-INR   No results found.   No diagnosis found.    MDM  Concern for cord compression. Lumbar MRI will be ordered. Case discussed with attending Dr. Fredderick Phenix. Patient will be given pain  control, she states that she will not be able to lie still for the MRI.  MRI changed to contrast based on the radiologist's recommendation.  Pt Signed out to PA [place] at shift change.       Wynetta Emery, PA-C 12/07/11 2047

## 2011-12-07 NOTE — ED Provider Notes (Signed)
Medical screening examination/treatment/procedure(s) were performed by non-physician practitioner and as supervising physician I was immediately available for consultation/collaboration.   Kartier Bennison, MD 12/07/11 2209 

## 2011-12-07 NOTE — ED Provider Notes (Signed)
Pt advised of results of MRI.   Radiologist reports mild central narrowing at l4 nerve root but not sufficently severe enough to cause cauda equina.  I spoke to Dr. Franky Macho who reviewed MRI.   He advised to have pt call office for follow up.   I will try prednisone taper to help with back pain.   I reviewed pt's labs.  No uti.    Lonia Skinner [place], [place] 12/07/11 2227

## 2011-12-07 NOTE — Telephone Encounter (Signed)
Caller: Gordie/Patient; Phone: [phone]; Reason for Call: Patient calling, Dr.  Ethelene Hal has told her to go to the ED today for evaluation and MRI due to total incontinence of bowel and bladder.

## 2011-12-07 NOTE — ED Provider Notes (Signed)
Medical screening examination/treatment/procedure(s) were performed by non-physician practitioner and as supervising physician I was immediately available for consultation/collaboration.   Rolan Bucco, MD 12/07/11 [phone]

## 2011-12-07 NOTE — ED Notes (Signed)
Scheduled for nerve block on 26th for sciatica with Dr. Ethelene Hal, called office today to tell them that has been incontinent of bowel and bladder- unable to tell if has to go, until standing up and then becomes incontinent. Was told to come to ED for MRI. Has had 3 back surgeries and 2 neck surgeries. 2 fusions,

## 2011-12-10 ENCOUNTER — Telehealth: Payer: Self-pay | Admitting: Family Medicine

## 2011-12-10 NOTE — Telephone Encounter (Signed)
Caller: [name]/Patient; Phone: [phone]; Reason for Call: Pt was sent by pain MD Dr.  Ethelene Hal for an MRI of bulging disc on Friday 11/15.  She was to f/u with neurosurgeon for management.  She wants Dr.  Abner Greenspan to look at MRI results and give her an opinion.  Please call pt back.

## 2011-12-10 NOTE — Telephone Encounter (Signed)
Please advise 

## 2011-12-10 NOTE — Telephone Encounter (Signed)
Pt informed and voiced understanding

## 2011-12-10 NOTE — Telephone Encounter (Signed)
Her L5 S1 hardware was red as stable but she does have a moderate sized disc protrusion at L3-4 , givent he level of her symptoms she should follow up with pain management

## 2011-12-24 ENCOUNTER — Ambulatory Visit (INDEPENDENT_AMBULATORY_CARE_PROVIDER_SITE_OTHER): Payer: BC Managed Care – PPO | Admitting: Family Medicine

## 2011-12-24 ENCOUNTER — Encounter: Payer: Self-pay | Admitting: Family Medicine

## 2011-12-24 VITALS — BP 140/82 | HR 56 | Temp 98.8°F | Ht 59.0 in | Wt 213.0 lb

## 2011-12-24 DIAGNOSIS — M25569 Pain in unspecified knee: Secondary | ICD-10-CM

## 2011-12-24 DIAGNOSIS — M549 Dorsalgia, unspecified: Secondary | ICD-10-CM

## 2011-12-24 DIAGNOSIS — R112 Nausea with vomiting, unspecified: Secondary | ICD-10-CM

## 2011-12-24 DIAGNOSIS — M25561 Pain in right knee: Secondary | ICD-10-CM

## 2011-12-24 DIAGNOSIS — T753XXA Motion sickness, initial encounter: Secondary | ICD-10-CM

## 2011-12-24 DIAGNOSIS — J329 Chronic sinusitis, unspecified: Secondary | ICD-10-CM

## 2011-12-24 HISTORY — DX: Nausea with vomiting, unspecified: R11.2

## 2011-12-24 HISTORY — DX: Motion sickness, initial encounter: T75.3XXA

## 2011-12-24 MED ORDER — DOXYCYCLINE HYCLATE 100 MG PO TABS: 100.0000 mg | ORAL_TABLET | Freq: Two times a day (BID) | ORAL | Status: DC

## 2011-12-24 MED ORDER — METHYLPREDNISOLONE 4 MG PO KIT: PACK | ORAL | Status: DC

## 2011-12-24 MED ORDER — ONDANSETRON 8 MG PO TBDP: 8.0000 mg | ORAL_TABLET | Freq: Three times a day (TID) | ORAL | Status: DC | PRN

## 2011-12-24 MED ORDER — SCOPOLAMINE 1 MG/3DAYS TD PT72: 1.0000 | MEDICATED_PATCH | TRANSDERMAL | Status: DC

## 2011-12-24 MED ORDER — ONDANSETRON HCL 8 MG PO TABS: 8.0000 mg | ORAL_TABLET | Freq: Three times a day (TID) | ORAL | Status: DC | PRN

## 2011-12-24 NOTE — Assessment & Plan Note (Signed)
May try Ondansetron prn and maintain adequate hydration. Cannot take Phenergan at present due to interaction with Scopolamine

## 2011-12-24 NOTE — Assessment & Plan Note (Signed)
Doxycycline 100 mg po bid x 15 days and a Medrol dosepak

## 2011-12-24 NOTE — Assessment & Plan Note (Signed)
Now with radiculopathy and pathology noted at L3 has appt with her surgeon later this week to discuss options, did have a steroid injection last week which helped some.

## 2011-12-24 NOTE — Patient Instructions (Addendum)
Motion Sickness Motion sickness is an unpleasant, temporary feeling of dizziness, nausea, and vomiting that occurs when a person is traveling. It can occur during travel by boat, car, airplane, or even on an amusement park ride. The symptoms of motion sickness usually get better once the motion or traveling stops, but problems may persist for hours or days. CAUSES  Motion of the body can cause fluid changes in your inner ear, which can result in motion sickness. Some people are more susceptible to motion sickness than others. Stress, other illnesses, or drinking too much alcohol may add to motion sickness. SYMPTOMS   Nausea.  Dizziness.  Unsteadiness when walking.  Vomiting. DIAGNOSIS  Motion sickness is diagnosed based on your symptoms while you are traveling or moving. TREATMENT  Most people improve rapidly after the motion stops. There are also over-the-counter medicines that can help with motion sickness. Your caregiver can prescribe motion sickness patches. These patches are placed behind your ear. PREVENTION   Avoid situations that cause your motion sickness, if possible.  Consider taking medicines such as meclizine or dimenhydrinate before going on a trip that may cause motion sickness.  Do not eat large meals or drink alcohol before or during travel.  Take small, frequent sips of liquids as needed.  Sit in an area of the airplane or boat with the least motion. On an airplane, sit near the wing. Lie back in your seat, if possible. When riding in a car, try to avoid sitting in the backseat.  Breathe slowly and deeply.  Do not read or focus on nearby objects, if possible, especially if the water or air is rough. However, watching the horizon or a distant object is sometimes helpful, especially in a boat.  Avoid areas where people are smoking, if possible.  Plan ahead for vacations. Your caregiver can help you with a prescription or suggestions for over-the-counter  medicines. HOME CARE INSTRUCTIONS   Only take over-the-counter or prescription medicines as directed by your caregiver.  If you use a motion sickness patch, wash your hands after you put the patch on. Touching your hands to your eyes after using the patch can enlarge (dilate) your pupils for 1 to 2 days and disturb your vision. SEEK MEDICAL CARE IF:   Your vomiting or nausea cannot be controlled with medicine and rest.  You notice blood in your vomit. This could be dark red or look like coffee grounds.  You faint or have severe dizziness or lightheadedness upon standing. These may be signs of dehydration.  You have a fever.  You have severe abdominal or chest pain.  You have trouble breathing.  You have a severe headache.  You develop weakness or numbness on one side of the body.  You have trouble speaking. MAKE SURE YOU:   Understand these instructions.  Will watch your condition.  Will get help right away if you are not doing well or get worse. Document Released: 01/08/2005 Document Revised: 04/02/2011 Document Reviewed: 08/08/2010 [facility] Patient Information 2013 [place], [place].   Sinusitis Sinusitis is redness, soreness, and swelling (inflammation) of the paranasal sinuses. Paranasal sinuses are air pockets within the bones of your face (beneath the eyes, the middle of the forehead, or above the eyes). In healthy paranasal sinuses, mucus is able to drain out, and air is able to circulate through them by way of your nose. However, when your paranasal sinuses are inflamed, mucus and air can become trapped. This can allow bacteria and other germs to grow and  cause infection. Sinusitis can develop quickly and last only a short time (acute) or continue over a long period (chronic). Sinusitis that lasts for more than 12 weeks is considered chronic.  CAUSES  Causes of sinusitis include:  Allergies.  Structural abnormalities, such as displacement of the cartilage that  separates your nostrils (deviated septum), which can decrease the air flow through your nose and sinuses and affect sinus drainage.  Functional abnormalities, such as when the small hairs (cilia) that line your sinuses and help remove mucus do not work properly or are not present. SYMPTOMS  Symptoms of acute and chronic sinusitis are the same. The primary symptoms are pain and pressure around the affected sinuses. Other symptoms include:  Upper toothache.  Earache.  Headache.  Bad breath.  Decreased sense of smell and taste.  A cough, which worsens when you are lying flat.  Fatigue.  Fever.  Thick drainage from your nose, which often is green and may contain pus (purulent).  Swelling and warmth over the affected sinuses. DIAGNOSIS  Your caregiver will perform a physical exam. During the exam, your caregiver may:  Look in your nose for signs of abnormal growths in your nostrils (nasal polyps).  Tap over the affected sinus to check for signs of infection.  View the inside of your sinuses (endoscopy) with a special imaging device with a light attached (endoscope), which is inserted into your sinuses. If your caregiver suspects that you have chronic sinusitis, one or more of the following tests may be recommended:  Allergy tests.  Nasal culture A sample of mucus is taken from your nose and sent to a lab and screened for bacteria.  Nasal cytology A sample of mucus is taken from your nose and examined by your caregiver to determine if your sinusitis is related to an allergy. TREATMENT  Most cases of acute sinusitis are related to a viral infection and will resolve on their own within 10 days. Sometimes medicines are prescribed to help relieve symptoms (pain medicine, decongestants, nasal steroid sprays, or saline sprays).  However, for sinusitis related to a bacterial infection, your caregiver will prescribe antibiotic medicines. These are medicines that will help kill the  bacteria causing the infection.  Rarely, sinusitis is caused by a fungal infection. In theses cases, your caregiver will prescribe antifungal medicine. For some cases of chronic sinusitis, surgery is needed. Generally, these are cases in which sinusitis recurs more than 3 times per year, despite other treatments. HOME CARE INSTRUCTIONS   Drink plenty of water. Water helps thin the mucus so your sinuses can drain more easily.  Use a humidifier.  Inhale steam 3 to 4 times a day (for example, sit in the bathroom with the shower running).  Apply a warm, moist washcloth to your face 3 to 4 times a day, or as directed by your caregiver.  Use saline nasal sprays to help moisten and clean your sinuses.  Take over-the-counter or prescription medicines for pain, discomfort, or fever only as directed by your caregiver. SEEK IMMEDIATE MEDICAL CARE IF:  You have increasing pain or severe headaches.  You have nausea, vomiting, or drowsiness.  You have swelling around your face.  You have vision problems.  You have a stiff neck.  You have difficulty breathing. MAKE SURE YOU:   Understand these instructions.  Will watch your condition.  Will get help right away if you are not doing well or get worse. Document Released: 01/08/2005 Document Revised: 04/02/2011 Document Reviewed: 01/23/2011 [employer] Patient Information  [address], [place].

## 2011-12-24 NOTE — Assessment & Plan Note (Addendum)
Given Scopolamine patch rx to use for her cruise later this week

## 2011-12-24 NOTE — Assessment & Plan Note (Signed)
Has appt with Dr Charlann Boxer later this week to discuss surgical correction in 2014

## 2011-12-24 NOTE — Progress Notes (Signed)
Patient ID: [name], female   DOB: 08/12/1945, 66 y.o.   MRN: [id number] [name] [id number] 1945-01-29 12/24/2011      Progress Note-Follow Up  Subjective  Chief Complaint  Chief Complaint  Patient presents with  . motion sickness    X 5 days, eats then it comes right up X 3 days    HPI  Patient is a 66 year old Caucasian female who is in today complaining of headache and a feeling of motion sickness. It's been going on for 3-5 days. About 3 days ago she started having some episodes of vomiting. She has a sensation of pain and pressure in her for head and when she eats anything after that she vomits. She's been vomiting roughly once daily. No diarrhea. Is this a partial low-grade fevers and some hot flashes but no obvious chills. No belly pain diarrhea. No chest pain, palpitations she does have some head congestion and pressure over her sinuses as well as some discomfort in and behind her ears. No sore throat no worsening myalgias although she does have persistent low back pain with radicular symptoms and a bulging disc at L3. Is scheduled to see her surgeons later this week for her bad knee as well as her back. Did have a steroid injection last week which she says helped her back pain marginally.  Past Medical History  Diagnosis Date  . Obesity   . Esophageal reflux   . Fibromyalgia   . Asthma   . Allergy     rhinitis  . Sleep apnea, obstructive   . Bronchitis, acute 10/31/2010  . Otitis media of both ears 11/16/2010  . Depression with anxiety 01/08/2011  . Back pain 01/08/2011  . Sinusitis 06/11/2011  . Fatigue 06/11/2011  . Knee pain, right 09/20/2011  . Otitis media 09/20/2011  . Varicose veins of lower limb 10/02/2011  . Otitis media of left ear 09/19/2010  . Motion sickness 12/24/2011  . Sinusitis 12/24/2011  . Nausea & vomiting 12/24/2011    Past Surgical History  Procedure Date  . Bariatric surgery   . Lumbar disc surgery   . Bladder suspension     complicated  by bowel nick/ clolostomy  . Gallbladder surgery 1978  . Appendectomy 1962  . Gastric bypass 2003    Patient also noted "gastric bypass resection - 2004"  . Cervical disc surgery     C-7 in 2004, C-4 and C-5 in 2010  . Colostomy bag     Confirm with patient. Listed under medical conditions on form dated 07/18/09.  [name]     Per medical history form dated 07/18/09.  [name] Hernia repair 2009    Two hernias and abdominal reconstruction  . Ivc filter     prophyllactically- no hx DVT    Family History  Problem Relation Age of Onset  . Alcohol abuse Mother   . Other Mother     accidental med overdose  . Emphysema Mother     smoked  . Bipolar disorder Mother   . Other Father     CHF  . Diabetes Father   . Cancer Father     throat ca/ smoked  . Alcohol abuse Father   . Stroke Father   . Hypertension Brother   . Hyperlipidemia Brother   . Obesity Brother   . Heart attack Maternal Grandfather   . Heart attack Paternal Grandmother   . Heart attack Paternal Grandfather   . Arthritis Son     psoriatic  .  Arthritis Son     psoriatic  . Obesity Son     History   Social History  . Marital Status: Married    Spouse Name: N/A    Number of Children: N/A  . Years of Education: N/A   Occupational History  . Not on file.   Social History Main Topics  . Smoking status: Never Smoker   . Smokeless tobacco: Never Used  . Alcohol Use: No     Comment: special occasions  . Drug Use: No  . Sexually Active: Yes   Other Topics Concern  . Not on file   Social History Narrative  . No narrative on file    Current Outpatient Prescriptions on File Prior to Visit  Medication Sig Dispense Refill  . ADVAIR DISKUS 250-50 MCG/DOSE AEPB inhale 1 puff by mouth twice a day  180 each  3  . ALPRAZolam (XANAX) 0.25 MG tablet Confirm dosage amount of 0.5 indicated on medical history form dated 07/18/09.  She reports she has prescription 0.25mg  tabs, 0.5 mg and 1 mg tabs and uses them  alternately at 0.25 mg to 1 mg doses.  1 tablet  0  . bifidobacterium infantis (ALIGN) capsule Take 1 capsule by mouth daily.       . calcitRIOL (ROCALTROL) 0.5 MCG capsule Take 0.5 mcg by mouth daily. Confirm patient still taking. Not on history form dated 07/18/09.      . clonazePAM (KLONOPIN) 1 MG tablet Take 2 mg by mouth at bedtime. Confirm dosage, not indicated on medical history form dated 07/18/09.      . DULoxetine (CYMBALTA) 60 MG capsule Not on history form dated 07/18/09.  30 mg in am and 60 mg in pm, po  1 capsule  0  . ipratropium (ATROVENT) 0.06 % nasal spray Place 2 sprays into the nose 2 (two) times daily.        [name] lamoTRIgine (LAMICTAL) 100 MG tablet Take 100 mg by mouth at bedtime.       [name] levocetirizine (XYZAL) 5 MG tablet Take 5 mg by mouth every evening.      . methylphenidate (RITALIN) 10 MG tablet Take 2 tablets (20 mg total) by mouth daily.  30 tablet  0  . montelukast (SINGULAIR) 10 MG tablet Take 1 tablet (10 mg total) by mouth at bedtime.  30 tablet  prn  . NON FORMULARY once a week. Allergy vaccine 1:10 G.O. Confirm patient still taking. Not on history form dated 07/18/09.      . NON FORMULARY CPAP- set on [employer]. Confirm patient still taking. Not on history form dated 07/18/09.      [name] Olopatadine HCl (PATANASE) 0.6 % SOLN Place 2 sprays into the nose as needed. Confirm patient still taking. Not on history form dated 07/18/09.       [name] pantoprazole (PROTONIX) 40 MG tablet take 1 tablet by mouth once daily  30 tablet  5  . ranitidine (ZANTAC) 150 MG tablet Take 2 tablets (300 mg total) by mouth at bedtime.  60 tablet  3  . solifenacin (VESICARE) 10 MG tablet Take 1 tablet (10 mg total) by mouth daily.  30 tablet  5  . traZODone (DESYREL) 50 MG tablet Take 50 mg by mouth at bedtime.       . TUBERCULIN SYR 1CC/27GX1/2" (B-D TB SYRINGE 1CC/27GX1/2") 27G X 1/2" 1 ML MISC by Does not apply route as directed. Confirm patient still taking. Not on history form dated 07/18/09.        . [  DISCONTINUED] montelukast (SINGULAIR) 10 MG tablet take 1 tablet by mouth once daily  30 tablet  4    Allergies  Allergen Reactions  . Cephalexin   . Cephalexin     Ask patient severity and reaction. Not indicated on medical history dated 07/18/09.  . Codeine   . Hydrocodone     Ask patient severity and reaction. Not indicated on medical history dated 07/18/09.  . Levofloxacin   . Meperidine Hcl   . Morphine And Related     Ask patient severity and reaction. Not indicated on medical history dated 07/18/09.  [name] Oxycodone-Acetaminophen   . Penicillins   . Sulfonamide Derivatives   . Vicodin (Hydrocodone-Acetaminophen)     Ask patient severity and reaction. Not indicated on medical history dated 07/18/09.    Review of Systems  Review of Systems  Constitutional: Positive for fever and malaise/fatigue. Negative for chills.  HENT: Positive for congestion. Negative for ear pain, sore throat and ear discharge.   Eyes: Negative for discharge.  Respiratory: Negative for cough, shortness of breath and wheezing.   Cardiovascular: Negative for chest pain, palpitations and leg swelling.  Gastrointestinal: Negative for nausea, abdominal pain and diarrhea.  Genitourinary: Negative for dysuria.  Musculoskeletal: Positive for back pain and joint pain. Negative for falls.       Right knee pain, low back pain  Skin: Negative for rash.  Neurological: Positive for dizziness and headaches. Negative for tingling, tremors and loss of consciousness.  Endo/Heme/Allergies: Negative for polydipsia.  Psychiatric/Behavioral: Negative for depression and suicidal ideas. The patient is not nervous/anxious and does not have insomnia.     Objective  BP 140/82  Pulse 56  Temp 98.8 F (37.1 C) (Temporal)  Ht 4\' 11"  (1.499 m)  Wt 213 lb (96.616 kg)  BMI 43.02 kg/m2  SpO2 96%  Physical Exam  Physical Exam  Constitutional: She is oriented to person, place, and time and well-developed, well-nourished,  and in no distress. No distress.  HENT:  Head: Normocephalic and atraumatic.       TMs dull, nasal mucosa boggy and erythematous.   Eyes: Conjunctivae normal are normal.  Neck: Neck supple. No thyromegaly present.  Cardiovascular: Normal rate and regular rhythm.   Murmur heard. Pulmonary/Chest: Effort normal and breath sounds normal. She has no wheezes.  Abdominal: She exhibits no distension and no mass.  Musculoskeletal: She exhibits no edema.  Lymphadenopathy:    She has cervical adenopathy.  Neurological: She is alert and oriented to person, place, and time. She has normal reflexes. No cranial nerve deficit. Gait normal. Coordination normal.  Skin: Skin is warm and dry. No rash noted. She is not diaphoretic.  Psychiatric: Memory, affect and judgment normal.    Lab Results  Component Value Date   TSH 2.60 06/11/2011   Lab Results  Component Value Date   WBC 6.9 12/07/2011   HGB 12.0 12/07/2011   HCT 35.5* 12/07/2011   MCV 91.5 12/07/2011   PLT 232 12/07/2011   Lab Results  Component Value Date   CREATININE 0.83 12/07/2011   BUN 13 12/07/2011   NA 139 12/07/2011   K 3.4* 12/07/2011   CL 103 12/07/2011   CO2 27 12/07/2011   Lab Results  Component Value Date   ALT 12 12/07/2011   AST 15 12/07/2011   ALKPHOS 95 12/07/2011   BILITOT 0.2* 12/07/2011     Assessment & Plan  Back pain Now with radiculopathy and pathology noted at L3 has appt with her surgeon  later this week to discuss options, did have a steroid injection last week which helped some.  Knee pain, right Has appt with Dr Charlann Boxer later this week to discuss surgical correction in 2014  Motion sickness Given Scopolamine patch rx to use for her cruise later this week  Nausea & vomiting May try Ondansetron prn and maintain adequate hydration. Cannot take Phenergan at present due to interaction with Scopolamine  Sinusitis Doxycycline 100 mg po bid x 15 days and a Medrol dosepak

## 2011-12-25 ENCOUNTER — Telehealth: Payer: Self-pay | Admitting: Family Medicine

## 2011-12-25 NOTE — Telephone Encounter (Signed)
Please contact patient. She is having trouble with insurance paying for her medrol.

## 2011-12-25 NOTE — Telephone Encounter (Signed)
We sent a new RX for Medrol over and informed pt

## 2012-01-24 DIAGNOSIS — I8310 Varicose veins of unspecified lower extremity with inflammation: Secondary | ICD-10-CM | POA: Diagnosis not present

## 2012-01-24 DIAGNOSIS — I872 Venous insufficiency (chronic) (peripheral): Secondary | ICD-10-CM | POA: Diagnosis not present

## 2012-01-28 DIAGNOSIS — I872 Venous insufficiency (chronic) (peripheral): Secondary | ICD-10-CM | POA: Diagnosis not present

## 2012-01-28 DIAGNOSIS — I8310 Varicose veins of unspecified lower extremity with inflammation: Secondary | ICD-10-CM | POA: Diagnosis not present

## 2012-01-29 DIAGNOSIS — M47817 Spondylosis without myelopathy or radiculopathy, lumbosacral region: Secondary | ICD-10-CM | POA: Diagnosis not present

## 2012-01-31 ENCOUNTER — Encounter (HOSPITAL_COMMUNITY): Payer: Self-pay | Admitting: Pharmacy Technician

## 2012-02-01 ENCOUNTER — Encounter (HOSPITAL_COMMUNITY)
Admission: RE | Admit: 2012-02-01 | Discharge: 2012-02-01 | Disposition: A | Discharge status: Home / Self Care | Payer: Medicare Other | Source: Ambulatory Visit | Attending: Orthopedic Surgery | Admitting: Orthopedic Surgery

## 2012-02-01 ENCOUNTER — Ambulatory Visit (HOSPITAL_COMMUNITY)
Admission: RE | Admit: 2012-02-01 | Discharge: 2012-02-01 | Disposition: A | Discharge status: Home / Self Care | Payer: Medicare Other | Source: Ambulatory Visit | Attending: Orthopedic Surgery | Admitting: Orthopedic Surgery

## 2012-02-01 ENCOUNTER — Encounter (HOSPITAL_COMMUNITY): Payer: Self-pay

## 2012-02-01 DIAGNOSIS — D649 Anemia, unspecified: Secondary | ICD-10-CM

## 2012-02-01 DIAGNOSIS — Z01810 Encounter for preprocedural cardiovascular examination: Secondary | ICD-10-CM | POA: Diagnosis not present

## 2012-02-01 DIAGNOSIS — I517 Cardiomegaly: Secondary | ICD-10-CM | POA: Insufficient documentation

## 2012-02-01 DIAGNOSIS — M1710 Unilateral primary osteoarthritis, unspecified knee: Secondary | ICD-10-CM | POA: Diagnosis not present

## 2012-02-01 DIAGNOSIS — Z01818 Encounter for other preprocedural examination: Secondary | ICD-10-CM | POA: Diagnosis not present

## 2012-02-01 DIAGNOSIS — Z01812 Encounter for preprocedural laboratory examination: Secondary | ICD-10-CM | POA: Insufficient documentation

## 2012-02-01 DIAGNOSIS — M1990 Unspecified osteoarthritis, unspecified site: Secondary | ICD-10-CM

## 2012-02-01 DIAGNOSIS — C801 Malignant (primary) neoplasm, unspecified: Secondary | ICD-10-CM

## 2012-02-01 HISTORY — DX: Anemia, unspecified: D64.9

## 2012-02-01 HISTORY — PX: ABDOMINAL SURGERY: SHX537

## 2012-02-01 HISTORY — DX: Malignant (primary) neoplasm, unspecified: C80.1

## 2012-02-01 HISTORY — DX: Unspecified osteoarthritis, unspecified site: M19.90

## 2012-02-01 HISTORY — DX: Panic disorder (episodic paroxysmal anxiety): F41.0

## 2012-02-01 HISTORY — PX: VULVA SURGERY: SHX837

## 2012-02-01 HISTORY — DX: Cardiac murmur, unspecified: R01.1

## 2012-02-01 HISTORY — PX: CARPAL TUNNEL RELEASE: SHX101

## 2012-02-01 HISTORY — PX: COLOSTOMY REVERSAL: SHX5782

## 2012-02-01 LAB — URINALYSIS, ROUTINE W REFLEX MICROSCOPIC
Glucose, UA: NEGATIVE mg/dL
Hgb urine dipstick: NEGATIVE
Leukocytes, UA: NEGATIVE
Protein, ur: NEGATIVE mg/dL
Specific Gravity, Urine: 1.012 (ref 1.005–1.030)
pH: 5.5 (ref 5.0–8.0)

## 2012-02-01 LAB — BASIC METABOLIC PANEL
CO2: 30 mEq/L (ref 19–32)
Calcium: 9.5 mg/dL (ref 8.4–10.5)
Chloride: 102 mEq/L (ref 96–112)
Glucose, Bld: 79 mg/dL (ref 70–99)
Sodium: 140 mEq/L (ref 135–145)

## 2012-02-01 LAB — CBC
HCT: 39.1 % (ref 36.0–46.0)
MCH: 30.3 pg (ref 26.0–34.0)
MCV: 94 fL (ref 78.0–100.0)
Platelets: 208 10*3/uL (ref 150–400)
RBC: 4.16 MIL/uL (ref 3.87–5.11)
WBC: 8.5 10*3/uL (ref 4.0–10.5)

## 2012-02-01 LAB — APTT: aPTT: 31 seconds (ref 24–37)

## 2012-02-01 NOTE — Patient Instructions (Addendum)
20 [name]  02/01/2012   Your procedure is scheduled on: 1-20  -2014  Report to [facility] at   0930     AM .  Call this number if you have problems the morning of surgery: [phone]  Or Presurgical Testing [phone](Jafeth Mustin)   Remember: Follow any bowel prep instructions per MD office. For Cpap use: Bring mask and tubing only.   Do not eat food:After Midnight.  .  Take these medicines the morning of surgery with A SIP OF WATER: Aprazolam. Calcitriol. Cymbalta. Guainefesin. Xyzal. Singulair. Ranitidine.  Use and bring Advair. Patanase, and Atrovent.   Do not wear jewelry, make-up or nail polish.  Do not wear lotions, powders, or perfumes. You may wear deodorant.  Do not shave 12 hours prior to first CHG shower(legs and under arms).(face and neck okay.)  Do not bring valuables to the hospital.  Contacts, dentures or bridgework,body piercing,  may not be worn into surgery.  Leave suitcase in the car. After surgery it may be brought to your room.  For patients admitted to the hospital, checkout time is 11:00 AM the day of discharge.   Patients discharged the day of surgery will not be allowed to drive home. Must have responsible person with you x 24 hours once discharged.  Name and phone number of your driver:[name], spouse [phone]   Special Instructions: CHG Shower Use Special Wash: see special instructions.(avoid face and genitals)   Please read over the following fact sheets that you were given: MRSA Information, Blood Transfusion fact sheet, Incentive Spirometry Instruction.    Failure to follow these instructions may result in Cancellation of your surgery.   Patient signature_______________________________________________________

## 2012-02-01 NOTE — Pre-Procedure Instructions (Signed)
02-01-12 EKG/ CXR done today.

## 2012-02-04 NOTE — Progress Notes (Signed)
Called dr Abner Greenspan pcp [facility] [name] , no old ekg available for pt

## 2012-02-06 DIAGNOSIS — M1710 Unilateral primary osteoarthritis, unspecified knee: Secondary | ICD-10-CM | POA: Diagnosis not present

## 2012-02-06 DIAGNOSIS — IMO0002 Reserved for concepts with insufficient information to code with codable children: Secondary | ICD-10-CM | POA: Diagnosis not present

## 2012-02-06 NOTE — H&P (Signed)
TOTAL KNEE ADMISSION H&P  Patient is being admitted for right total knee arthroplasty.  Subjective:  Chief Complaint:  right knee OA / pain.  HPI: [name], 67 y.o. female, has a history of pain and functional disability in the right knee due to arthritis and has failed non-surgical conservative treatments for greater than 12 weeks to includeNSAID's and/or analgesics, corticosteriod injections and activity modification.  Onset of symptoms was gradual, starting 5 years ago with gradually worsening course since that time. The patient noted no past surgery on the right knee(s).  Patient currently rates pain in the right knee(s) at 8 out of 10 with activity. Patient has night pain, worsening of pain with activity and weight bearing, pain that interferes with activities of daily living and pain with passive range of motion.  Patient has evidence of periarticular osteophytes and joint space narrowing by imaging studies.  There is no active infection. Risks, benefits and expectations were discussed with the patient. Patient understand the risks, benefits and expectations and wishes to proceed with surgery.   D/C Plans:   SNF  Vs Home with HHPT  Post-op Meds:   No Rx given   Tranexamic Acid:   To be given  Decadron:    To be given  FYI:    Dilaudid after surgery PO and IV  Uses CPAP at home, but never in hospital. Has feelings of claustrophobia  No Iron - doesn't absorb orally and causes severe nausea   Patient Active Problem List   Diagnosis Date Noted  . Motion sickness 12/24/2011  . Sinusitis 12/24/2011  . Nausea & vomiting 12/24/2011  . Varicose veins of lower limb 10/02/2011  . Knee pain, right 09/20/2011  . Fatigue 06/11/2011  . Depression with anxiety 01/08/2011  . Back pain 01/08/2011  . Allergic conjunctivitis 09/21/2010  . SCC (squamous cell carcinoma), face 09/19/2010  . Otitis media of left ear 09/19/2010  . Chills 07/12/2010  . Fever 07/12/2010  . Menopause 07/12/2010   . ABDOMINAL WALL HERNIA 04/06/2010  . COUGH 04/06/2010  . ABDOMINAL PAIN, GENERALIZED 04/06/2010  . ENCOPRESIS 03/21/2010  . DERMATITIS, ATOPIC 03/21/2010  . DM 03/07/2010  . DIABETIC  RETINOPATHY 03/07/2010  . OTHER HYPERPARATHYROIDISM 03/07/2010  . UNSPECIFIED VITAMIN D DEFICIENCY 03/07/2010  . HYPERLIPIDEMIA 03/07/2010  . HYPOCALCEMIA 03/07/2010  . ANEMIA, IRON DEFICIENCY 03/07/2010  . TREMOR, ESSENTIAL 03/07/2010  . MITRAL REGURGITATION, 0 (MILD) 03/07/2010  . TRICUSPID REGURGITATION, MILD 03/07/2010  . ESSENTIAL HYPERTENSION, BENIGN 03/07/2010  . CARDIOMEGALY, MILD 03/07/2010  . DEGENERATIVE JOINT DISEASE, LEFT HIP 03/07/2010  . OSTEOPENIA 03/07/2010  . DIARRHEA 03/07/2010  . UNSPECIFIED URINARY INCONTINENCE 03/07/2010  . PERSONAL HX OF METHICILLIN RESIST STAPH AUREUS 03/07/2010  . DYSPNEA 11/24/2007  . OBESITY 02/27/2007  . SLEEP APNEA, OBSTRUCTIVE 02/27/2007  . Seasonal and perennial allergic rhinitis 02/27/2007  . Asthma with bronchitis 02/27/2007  . ESOPHAGEAL REFLUX 02/27/2007  . FIBROMYALGIA 02/27/2007   Past Medical History  Diagnosis Date  . Obesity   . Esophageal reflux   . Fibromyalgia   . Asthma   . Allergy     rhinitis  . Sleep apnea, obstructive   . Bronchitis, acute 10/31/2010  . Otitis media of both ears 11/16/2010  . Depression with anxiety 01/08/2011  . Back pain 01/08/2011  . Sinusitis 06/11/2011  . Fatigue 06/11/2011  . Knee pain, right 09/20/2011  . Otitis media 09/20/2011  . Varicose veins of lower limb 10/02/2011  . Otitis media of left ear 09/19/2010  .  Motion sickness 12/24/2011  . Sinusitis 12/24/2011  . Nausea & vomiting 12/24/2011  . Heart murmur   . Panic attacks   . Arthritis 02-01-12    osteoarthritis, Back pain, spine surgeries ? retain hardware cervial and  lumbar.  . Anemia 02-01-12    iron absorption problem  . Cancer 02-01-12    squamous cell -face    Past Surgical History  Procedure Date  . Bariatric surgery   . Lumbar disc  surgery   . Bladder suspension     complicated by bowel nick/ clolostomy  . Gallbladder surgery 1978  . Appendectomy 1962  . Gastric bypass 2003    Patient also noted "gastric bypass resection - 2004"  . Cervical disc surgery     C-7 in 2004, C-4 and C-5 in 2010  . Colostomy bag     Confirm with patient. Listed under medical conditions on form dated 07/18/09.  [name]     Per medical history form dated 07/18/09.  [name] Hernia repair 2009    Two hernias and abdominal reconstruction  . Ivc filter     prophyllactically- no hx DVT  . Carpal tunnel release 02-01-12    right  . Colostomy reversal 02-01-12  . Abdominal surgery 02-01-12    7'08-" major abdominal surgery for correction of pannilectomy" -[facility]  . Vulva surgery 02-01-12    cyst removal-pt 8 months pregnant     Allergies  Allergen Reactions  . Hydrocodone     Ask patient severity and reaction. Not indicated on medical history dated 07/18/09.02-01-12 nausea and vomiting and headaches.  . Cephalexin     Ask patient severity and reaction. Not indicated on medical history dated 07/18/09.? Was this or Levaquin that caused Neuropathy  . Codeine     Crazy in the head, bp drops  . Levofloxacin Itching and Other (See Comments)    Neuropathy   . Meperidine Hcl     B/P drops  . Morphine And Related     Ask patient severity and reaction. Not indicated on medical history dated 07/18/09. 02-01-12 States" extreme migraines"  . Oxycodone-Acetaminophen Other (See Comments)    Crazy in head, drop in bp  . Penicillins Nausea And Vomiting  . Sulfonamide Derivatives Nausea And Vomiting    Stomach upset  . Vicodin (Hydrocodone-Acetaminophen)     Ask patient severity and reaction. Not indicated on medical history dated 07/18/09.02-01-12 same as Hydrocodone  . Adhesive (Tape) Rash    Latex in adhesive tape. Please use paper tape    History  Substance Use Topics  . Smoking status: Never Smoker   . Smokeless tobacco: Never  Used  . Alcohol Use: No     Comment: special occasions    Family History  Problem Relation Age of Onset  . Alcohol abuse Mother   . Other Mother     accidental med overdose  . Emphysema Mother     smoked  . Bipolar disorder Mother   . Other Father     CHF  . Diabetes Father   . Cancer Father     throat ca/ smoked  . Alcohol abuse Father   . Stroke Father   . Hypertension Brother   . Hyperlipidemia Brother   . Obesity Brother   . Heart attack Maternal Grandfather   . Heart attack Paternal Grandmother   . Heart attack Paternal Grandfather   . Arthritis Son     psoriatic  . Arthritis Son     psoriatic  .  Obesity Son      Review of Systems  Constitutional: Negative.   Eyes: Negative.   Respiratory: Negative.   Cardiovascular: Negative.   Gastrointestinal: Negative.   Genitourinary: Positive for urgency and frequency.  Musculoskeletal: Positive for myalgias, back pain and joint pain.  Skin: Positive for rash (eczema).  Neurological: Positive for headaches.  Endo/Heme/Allergies: Positive for environmental allergies.  Psychiatric/Behavioral: Negative.     Objective:  Physical Exam  Constitutional: She is oriented to person, place, and time. She appears well-developed and well-nourished.  HENT:  Head: Normocephalic and atraumatic.  Mouth/Throat: Oropharynx is clear and moist.  Eyes: Pupils are equal, round, and reactive to light.  Neck: Neck supple. No JVD present. No tracheal deviation present. No thyromegaly present.  Cardiovascular: Normal rate, regular rhythm, normal heart sounds and intact distal pulses.   Respiratory: Effort normal and breath sounds normal. No respiratory distress. She has no wheezes.  GI: Soft. There is no tenderness. There is no guarding.  Musculoskeletal:       Right knee: She exhibits decreased range of motion, swelling and bony tenderness. She exhibits no effusion, no ecchymosis, no deformity, no laceration and no erythema. tenderness  found.  Lymphadenopathy:    She has no cervical adenopathy.  Neurological: She is alert and oriented to person, place, and time.  Skin: Skin is warm and dry.  Psychiatric: She has a normal mood and affect.     Labs:  Estimated Body mass index is 43.02 kg/(m^2) as calculated from the following:   Height as of 12/24/11: 4\' 11" (1.499 m).   Weight as of 12/24/11: 213 lb(96.616 kg).   Imaging Review Plain radiographs demonstrate severe degenerative joint disease of the right knee(s). The overall alignment is neutral. The bone quality appears to be good for age and reported activity level.  Assessment/Plan:  End stage arthritis, right knee   The patient history, physical examination, clinical judgment of the provider and imaging studies are consistent with end stage degenerative joint disease of the right knee(s) and total knee arthroplasty is deemed medically necessary. The treatment options including medical management, injection therapy arthroscopy and arthroplasty were discussed at length. The risks and benefits of total knee arthroplasty were presented and reviewed. The risks due to aseptic loosening, infection, stiffness, patella tracking problems, thromboembolic complications and other imponderables were discussed. The patient acknowledged the explanation, agreed to proceed with the plan and consent was signed. Patient is being admitted for inpatient treatment for surgery, pain control, PT, OT, prophylactic antibiotics, VTE prophylaxis, progressive ambulation and ADL's and discharge planning. The patient is planning to be discharged to skilled nursing facility vs home.    Anastasio Auerbach Sreekar Broyhill   PAC  02/06/2012, 5:30 PM

## 2012-02-08 NOTE — Progress Notes (Addendum)
Writer called pt and left message she is to arrive 0830 on 02/11/12 for surgery to [facility] Stay. She is asked to call back and confirm she received this message.  Pt called back and verbalizes understanding to arrive 0830.

## 2012-02-11 ENCOUNTER — Inpatient Hospital Stay (HOSPITAL_COMMUNITY)
Admission: RE | Admit: 2012-02-11 | Discharge: 2012-02-14 | DRG: 470 | Disposition: A | Discharge status: Skilled Nursing Facility | Payer: Medicare Other | Source: Ambulatory Visit | Attending: Orthopedic Surgery | Admitting: Orthopedic Surgery

## 2012-02-11 ENCOUNTER — Encounter (HOSPITAL_COMMUNITY): Payer: Self-pay | Admitting: Anesthesiology

## 2012-02-11 ENCOUNTER — Encounter (HOSPITAL_COMMUNITY)
Admission: RE | Disposition: A | Discharge status: Skilled Nursing Facility | Payer: Self-pay | Source: Ambulatory Visit | Attending: Orthopedic Surgery

## 2012-02-11 ENCOUNTER — Inpatient Hospital Stay (HOSPITAL_COMMUNITY): Payer: Medicare Other | Admitting: Anesthesiology

## 2012-02-11 ENCOUNTER — Encounter (HOSPITAL_COMMUNITY): Payer: Self-pay | Admitting: *Deleted

## 2012-02-11 DIAGNOSIS — Z96659 Presence of unspecified artificial knee joint: Secondary | ICD-10-CM

## 2012-02-11 DIAGNOSIS — M549 Dorsalgia, unspecified: Secondary | ICD-10-CM | POA: Diagnosis not present

## 2012-02-11 DIAGNOSIS — Z471 Aftercare following joint replacement surgery: Secondary | ICD-10-CM | POA: Diagnosis not present

## 2012-02-11 DIAGNOSIS — I10 Essential (primary) hypertension: Secondary | ICD-10-CM | POA: Diagnosis present

## 2012-02-11 DIAGNOSIS — R269 Unspecified abnormalities of gait and mobility: Secondary | ICD-10-CM | POA: Diagnosis not present

## 2012-02-11 DIAGNOSIS — K219 Gastro-esophageal reflux disease without esophagitis: Secondary | ICD-10-CM | POA: Diagnosis present

## 2012-02-11 DIAGNOSIS — M1990 Unspecified osteoarthritis, unspecified site: Secondary | ICD-10-CM | POA: Diagnosis not present

## 2012-02-11 DIAGNOSIS — G4730 Sleep apnea, unspecified: Secondary | ICD-10-CM | POA: Diagnosis not present

## 2012-02-11 DIAGNOSIS — G4733 Obstructive sleep apnea (adult) (pediatric): Secondary | ICD-10-CM | POA: Diagnosis present

## 2012-02-11 DIAGNOSIS — I739 Peripheral vascular disease, unspecified: Secondary | ICD-10-CM | POA: Diagnosis present

## 2012-02-11 DIAGNOSIS — R279 Unspecified lack of coordination: Secondary | ICD-10-CM | POA: Diagnosis not present

## 2012-02-11 DIAGNOSIS — R011 Cardiac murmur, unspecified: Secondary | ICD-10-CM | POA: Diagnosis present

## 2012-02-11 DIAGNOSIS — G4700 Insomnia, unspecified: Secondary | ICD-10-CM | POA: Diagnosis not present

## 2012-02-11 DIAGNOSIS — D62 Acute posthemorrhagic anemia: Secondary | ICD-10-CM | POA: Diagnosis not present

## 2012-02-11 DIAGNOSIS — E669 Obesity, unspecified: Secondary | ICD-10-CM | POA: Diagnosis not present

## 2012-02-11 DIAGNOSIS — F3289 Other specified depressive episodes: Secondary | ICD-10-CM | POA: Diagnosis not present

## 2012-02-11 DIAGNOSIS — Z6841 Body Mass Index (BMI) 40.0 and over, adult: Secondary | ICD-10-CM

## 2012-02-11 DIAGNOSIS — R491 Aphonia: Secondary | ICD-10-CM | POA: Diagnosis not present

## 2012-02-11 DIAGNOSIS — M1710 Unilateral primary osteoarthritis, unspecified knee: Principal | ICD-10-CM | POA: Diagnosis present

## 2012-02-11 DIAGNOSIS — J45909 Unspecified asthma, uncomplicated: Secondary | ICD-10-CM | POA: Diagnosis not present

## 2012-02-11 DIAGNOSIS — IMO0002 Reserved for concepts with insufficient information to code with codable children: Secondary | ICD-10-CM | POA: Diagnosis not present

## 2012-02-11 DIAGNOSIS — IMO0001 Reserved for inherently not codable concepts without codable children: Secondary | ICD-10-CM | POA: Diagnosis not present

## 2012-02-11 DIAGNOSIS — E119 Type 2 diabetes mellitus without complications: Secondary | ICD-10-CM | POA: Diagnosis present

## 2012-02-11 DIAGNOSIS — F411 Generalized anxiety disorder: Secondary | ICD-10-CM | POA: Diagnosis not present

## 2012-02-11 DIAGNOSIS — F329 Major depressive disorder, single episode, unspecified: Secondary | ICD-10-CM | POA: Diagnosis not present

## 2012-02-11 DIAGNOSIS — N318 Other neuromuscular dysfunction of bladder: Secondary | ICD-10-CM | POA: Diagnosis not present

## 2012-02-11 DIAGNOSIS — S8990XA Unspecified injury of unspecified lower leg, initial encounter: Secondary | ICD-10-CM | POA: Diagnosis not present

## 2012-02-11 DIAGNOSIS — M25569 Pain in unspecified knee: Secondary | ICD-10-CM | POA: Diagnosis not present

## 2012-02-11 DIAGNOSIS — R1312 Dysphagia, oropharyngeal phase: Secondary | ICD-10-CM | POA: Diagnosis not present

## 2012-02-11 DIAGNOSIS — D500 Iron deficiency anemia secondary to blood loss (chronic): Secondary | ICD-10-CM | POA: Diagnosis not present

## 2012-02-11 DIAGNOSIS — M6281 Muscle weakness (generalized): Secondary | ICD-10-CM | POA: Diagnosis not present

## 2012-02-11 DIAGNOSIS — T7840XA Allergy, unspecified, initial encounter: Secondary | ICD-10-CM | POA: Diagnosis not present

## 2012-02-11 HISTORY — PX: TOTAL KNEE ARTHROPLASTY: SHX125

## 2012-02-11 LAB — TYPE AND SCREEN
ABO/RH(D): O POS
Antibody Screen: NEGATIVE

## 2012-02-11 SURGERY — ARTHROPLASTY, KNEE, TOTAL
Anesthesia: General | Site: Knee | Laterality: Right | Wound class: Clean

## 2012-02-11 MED ORDER — MENTHOL 3 MG MT LOZG
1.0000 | LOZENGE | OROMUCOSAL | Status: DC | PRN
Start: 2012-02-11 — End: 2012-02-14
  Filled 2012-02-11: qty 9

## 2012-02-11 MED ORDER — MONTELUKAST SODIUM 10 MG PO TABS
10.0000 mg | ORAL_TABLET | Freq: Every morning | ORAL | Status: DC
  Administered 2012-02-12 – 2012-02-14 (×2): 10 mg via ORAL
  Filled 2012-02-11 (×3): qty 1

## 2012-02-11 MED ORDER — METHOCARBAMOL 500 MG PO TABS
500.0000 mg | ORAL_TABLET | Freq: Four times a day (QID) | ORAL | Status: DC | PRN
  Administered 2012-02-11 – 2012-02-13 (×7): 500 mg via ORAL
  Filled 2012-02-11 (×7): qty 1

## 2012-02-11 MED ORDER — METHYLPHENIDATE HCL 10 MG PO TABS
10.0000 mg | ORAL_TABLET | Freq: Two times a day (BID) | ORAL | Status: DC
  Administered 2012-02-11 – 2012-02-14 (×6): 10 mg via ORAL
  Filled 2012-02-11 (×6): qty 1

## 2012-02-11 MED ORDER — MOMETASONE FURO-FORMOTEROL FUM 100-5 MCG/ACT IN AERO
2.0000 | INHALATION_SPRAY | Freq: Two times a day (BID) | RESPIRATORY_TRACT | Status: DC
  Administered 2012-02-11 – 2012-02-14 (×6): 2 via RESPIRATORY_TRACT
  Filled 2012-02-11: qty 8.8

## 2012-02-11 MED ORDER — BISACODYL 10 MG RE SUPP: 10.0000 mg | Freq: Every day | RECTAL | Status: DC | PRN

## 2012-02-11 MED ORDER — STERILE WATER FOR IRRIGATION IR SOLN
Status: DC | PRN
  Administered 2012-02-11: 3000 mL

## 2012-02-11 MED ORDER — SODIUM CHLORIDE 0.9 % IV SOLN
INTRAVENOUS | Status: DC
  Administered 2012-02-11 – 2012-02-12 (×2): via INTRAVENOUS
  Filled 2012-02-11 (×10): qty 1000

## 2012-02-11 MED ORDER — IPRATROPIUM BROMIDE 0.06 % NA SOLN
2.0000 | Freq: Two times a day (BID) | NASAL | Status: DC
  Administered 2012-02-14: 2 via NASAL

## 2012-02-11 MED ORDER — IPRATROPIUM BROMIDE 0.06 % NA SOLN
2.0000 | Freq: Two times a day (BID) | NASAL | Status: DC
  Filled 2012-02-11: qty 15

## 2012-02-11 MED ORDER — CALCITRIOL 0.5 MCG PO CAPS
0.5000 ug | ORAL_CAPSULE | Freq: Every day | ORAL | Status: DC
  Administered 2012-02-11 – 2012-02-14 (×4): 0.5 ug via ORAL
  Filled 2012-02-11 (×4): qty 1

## 2012-02-11 MED ORDER — CLINDAMYCIN PHOSPHATE 600 MG/50ML IV SOLN
600.0000 mg | Freq: Four times a day (QID) | INTRAVENOUS | Status: AC
  Administered 2012-02-11 (×2): 600 mg via INTRAVENOUS
  Filled 2012-02-11 (×2): qty 50

## 2012-02-11 MED ORDER — DULOXETINE HCL 60 MG PO CPEP
120.0000 mg | ORAL_CAPSULE | Freq: Every morning | ORAL | Status: DC
  Administered 2012-02-12 – 2012-02-14 (×3): 120 mg via ORAL
  Filled 2012-02-11 (×3): qty 2

## 2012-02-11 MED ORDER — TRAZODONE HCL 50 MG PO TABS
50.0000 mg | ORAL_TABLET | Freq: Every day | ORAL | Status: DC
  Administered 2012-02-11 – 2012-02-13 (×3): 50 mg via ORAL
  Filled 2012-02-11 (×4): qty 1

## 2012-02-11 MED ORDER — HYDROMORPHONE HCL PF 1 MG/ML IJ SOLN
0.5000 mg | INTRAMUSCULAR | Status: DC | PRN
  Administered 2012-02-12: 1 mg via INTRAVENOUS
  Administered 2012-02-12: 0.5 mg via INTRAVENOUS
  Administered 2012-02-13: 1 mg via INTRAVENOUS
  Filled 2012-02-11 (×4): qty 1

## 2012-02-11 MED ORDER — FLEET ENEMA 7-19 GM/118ML RE ENEM: 1.0000 | ENEMA | Freq: Once | RECTAL | Status: AC | PRN

## 2012-02-11 MED ORDER — ONDANSETRON HCL 4 MG PO TABS: 4.0000 mg | ORAL_TABLET | Freq: Four times a day (QID) | ORAL | Status: DC | PRN

## 2012-02-11 MED ORDER — BUPIVACAINE-EPINEPHRINE PF 0.25-1:200000 % IJ SOLN
INTRAMUSCULAR | Status: DC | PRN
  Administered 2012-02-11: 50 mL

## 2012-02-11 MED ORDER — PROMETHAZINE HCL 25 MG/ML IJ SOLN: 6.2500 mg | INTRAMUSCULAR | Status: DC | PRN

## 2012-02-11 MED ORDER — CLONAZEPAM 1 MG PO TABS
1.0000 mg | ORAL_TABLET | Freq: Every day | ORAL | Status: DC
  Administered 2012-02-11 – 2012-02-13 (×3): 1 mg via ORAL
  Filled 2012-02-11 (×3): qty 1

## 2012-02-11 MED ORDER — PROPOFOL INFUSION 10 MG/ML OPTIME
INTRAVENOUS | Status: DC | PRN
  Administered 2012-02-11: 75 ug/kg/min via INTRAVENOUS

## 2012-02-11 MED ORDER — KETOROLAC TROMETHAMINE 30 MG/ML IJ SOLN
INTRAMUSCULAR | Status: DC | PRN
  Administered 2012-02-11: 30 mg via INTRAVENOUS

## 2012-02-11 MED ORDER — MIDAZOLAM HCL 5 MG/5ML IJ SOLN
INTRAMUSCULAR | Status: DC | PRN
  Administered 2012-02-11: 2 mg via INTRAVENOUS

## 2012-02-11 MED ORDER — ALPRAZOLAM 0.25 MG PO TABS
0.2500 mg | ORAL_TABLET | Freq: Every day | ORAL | Status: DC | PRN
  Administered 2012-02-12: 0.25 mg via ORAL
  Filled 2012-02-11: qty 1

## 2012-02-11 MED ORDER — ONDANSETRON HCL 4 MG/2ML IJ SOLN: 4.0000 mg | Freq: Four times a day (QID) | INTRAMUSCULAR | Status: DC | PRN

## 2012-02-11 MED ORDER — DEXTROSE 5 % IV SOLN
500.0000 mg | Freq: Four times a day (QID) | INTRAVENOUS | Status: DC | PRN
  Administered 2012-02-11: 500 mg via INTRAVENOUS
  Filled 2012-02-11: qty 5

## 2012-02-11 MED ORDER — OLOPATADINE HCL 0.6 % NA SOLN
2.0000 [drp] | Freq: Every morning | NASAL | Status: DC
  Filled 2012-02-11 (×4): qty 0.4

## 2012-02-11 MED ORDER — ALUM & MAG HYDROXIDE-SIMETH 200-200-20 MG/5ML PO SUSP: 30.0000 mL | ORAL | Status: DC | PRN

## 2012-02-11 MED ORDER — CLINDAMYCIN PHOSPHATE 900 MG/50ML IV SOLN
900.0000 mg | INTRAVENOUS | Status: AC
  Administered 2012-02-11: 900 mg via INTRAVENOUS
  Filled 2012-02-11: qty 50

## 2012-02-11 MED ORDER — ZOLPIDEM TARTRATE 5 MG PO TABS: 5.0000 mg | ORAL_TABLET | Freq: Every evening | ORAL | Status: DC | PRN

## 2012-02-11 MED ORDER — LAMOTRIGINE 100 MG PO TABS
100.0000 mg | ORAL_TABLET | Freq: Every day | ORAL | Status: DC
  Administered 2012-02-11 – 2012-02-13 (×3): 100 mg via ORAL
  Filled 2012-02-11 (×4): qty 1

## 2012-02-11 MED ORDER — PANTOPRAZOLE SODIUM 40 MG PO TBEC
40.0000 mg | DELAYED_RELEASE_TABLET | Freq: Every day | ORAL | Status: DC
  Administered 2012-02-11 – 2012-02-13 (×4): 40 mg via ORAL
  Filled 2012-02-11 (×5): qty 1

## 2012-02-11 MED ORDER — FERROUS SULFATE 325 (65 FE) MG PO TABS
325.0000 mg | ORAL_TABLET | Freq: Three times a day (TID) | ORAL | Status: DC
  Administered 2012-02-12 – 2012-02-13 (×2): 325 mg via ORAL
  Filled 2012-02-11 (×11): qty 1

## 2012-02-11 MED ORDER — DEXAMETHASONE SODIUM PHOSPHATE 10 MG/ML IJ SOLN
INTRAMUSCULAR | Status: DC | PRN
  Administered 2012-02-11: 10 mg via INTRAVENOUS

## 2012-02-11 MED ORDER — VANCOMYCIN HCL 1000 MG IV SOLR
1000.0000 mg | INTRAVENOUS | Status: DC | PRN
  Administered 2012-02-11: 1000 mg via INTRAVENOUS

## 2012-02-11 MED ORDER — HYDROMORPHONE HCL PF 1 MG/ML IJ SOLN
0.2500 mg | INTRAMUSCULAR | Status: DC | PRN
  Administered 2012-02-11 (×2): 0.5 mg via INTRAVENOUS

## 2012-02-11 MED ORDER — VANCOMYCIN HCL 1000 MG IV SOLR: 1000.0000 mg | Freq: Once | INTRAVENOUS | Status: DC

## 2012-02-11 MED ORDER — DARIFENACIN HYDROBROMIDE ER 15 MG PO TB24
15.0000 mg | ORAL_TABLET | Freq: Every day | ORAL | Status: DC
  Administered 2012-02-12 – 2012-02-14 (×3): 15 mg via ORAL
  Filled 2012-02-11 (×3): qty 1

## 2012-02-11 MED ORDER — LACTATED RINGERS IV SOLN
INTRAVENOUS | Status: DC
  Administered 2012-02-11: 12:00:00 via INTRAVENOUS
  Administered 2012-02-11: 1000 mL via INTRAVENOUS

## 2012-02-11 MED ORDER — SODIUM CHLORIDE 0.9 % IR SOLN
Status: DC | PRN
  Administered 2012-02-11: 1000 mL

## 2012-02-11 MED ORDER — LEVOCETIRIZINE DIHYDROCHLORIDE 5 MG PO TABS: 5.0000 mg | ORAL_TABLET | Freq: Every morning | ORAL | Status: DC

## 2012-02-11 MED ORDER — IPRATROPIUM BROMIDE 0.03 % NA SOLN
2.0000 | Freq: Four times a day (QID) | NASAL | Status: DC
  Filled 2012-02-11: qty 30

## 2012-02-11 MED ORDER — LORATADINE 10 MG PO TABS
10.0000 mg | ORAL_TABLET | Freq: Every day | ORAL | Status: DC
  Administered 2012-02-12 – 2012-02-14 (×3): 10 mg via ORAL
  Filled 2012-02-11 (×3): qty 1

## 2012-02-11 MED ORDER — CHLORHEXIDINE GLUCONATE 4 % EX LIQD
60.0000 mL | Freq: Once | CUTANEOUS | Status: DC
  Filled 2012-02-11: qty 60

## 2012-02-11 MED ORDER — FENTANYL CITRATE 0.05 MG/ML IJ SOLN
INTRAMUSCULAR | Status: DC | PRN
  Administered 2012-02-11 (×2): 50 ug via INTRAVENOUS

## 2012-02-11 MED ORDER — DIPHENHYDRAMINE HCL 25 MG PO CAPS
25.0000 mg | ORAL_CAPSULE | Freq: Four times a day (QID) | ORAL | Status: DC | PRN
  Administered 2012-02-11 – 2012-02-13 (×4): 25 mg via ORAL
  Filled 2012-02-11 (×4): qty 1

## 2012-02-11 MED ORDER — DOCUSATE SODIUM 100 MG PO CAPS
100.0000 mg | ORAL_CAPSULE | Freq: Two times a day (BID) | ORAL | Status: DC
  Administered 2012-02-11 – 2012-02-14 (×6): 100 mg via ORAL

## 2012-02-11 MED ORDER — BUPIVACAINE IN DEXTROSE 0.75-8.25 % IT SOLN
INTRATHECAL | Status: DC | PRN
  Administered 2012-02-11: 2 mL via INTRATHECAL

## 2012-02-11 MED ORDER — FAMOTIDINE 20 MG PO TABS
20.0000 mg | ORAL_TABLET | Freq: Every day | ORAL | Status: DC
  Administered 2012-02-11 – 2012-02-14 (×4): 20 mg via ORAL
  Filled 2012-02-11 (×4): qty 1

## 2012-02-11 MED ORDER — 0.9 % SODIUM CHLORIDE (POUR BTL) OPTIME
TOPICAL | Status: DC | PRN
  Administered 2012-02-11: 1000 mL

## 2012-02-11 MED ORDER — ACETAMINOPHEN 10 MG/ML IV SOLN
INTRAVENOUS | Status: DC | PRN
  Administered 2012-02-11: 1000 mg via INTRAVENOUS

## 2012-02-11 MED ORDER — PHENOL 1.4 % MT LIQD
1.0000 | OROMUCOSAL | Status: DC | PRN
  Filled 2012-02-11: qty 177

## 2012-02-11 MED ORDER — POLYETHYLENE GLYCOL 3350 17 G PO PACK
17.0000 g | PACK | Freq: Two times a day (BID) | ORAL | Status: DC
  Administered 2012-02-11 – 2012-02-14 (×6): 17 g via ORAL

## 2012-02-11 MED ORDER — HYDROMORPHONE HCL 2 MG PO TABS
2.0000 mg | ORAL_TABLET | ORAL | Status: DC
  Administered 2012-02-11 (×2): 4 mg via ORAL
  Administered 2012-02-11: 2 mg via ORAL
  Administered 2012-02-12 (×2): 4 mg via ORAL
  Administered 2012-02-12: 2 mg via ORAL
  Administered 2012-02-12: 4 mg via ORAL
  Administered 2012-02-12 – 2012-02-13 (×3): 2 mg via ORAL
  Administered 2012-02-13: 4 mg via ORAL
  Administered 2012-02-13 – 2012-02-14 (×2): 2 mg via ORAL
  Administered 2012-02-14: 4 mg via ORAL
  Administered 2012-02-14: 2 mg via ORAL
  Filled 2012-02-11: qty 1
  Filled 2012-02-11: qty 2
  Filled 2012-02-11: qty 1
  Filled 2012-02-11 (×4): qty 2
  Filled 2012-02-11 (×3): qty 1
  Filled 2012-02-11: qty 2
  Filled 2012-02-11 (×3): qty 1
  Filled 2012-02-11 (×2): qty 2

## 2012-02-11 MED ORDER — RIVAROXABAN 10 MG PO TABS
10.0000 mg | ORAL_TABLET | ORAL | Status: DC
  Administered 2012-02-12 – 2012-02-14 (×3): 10 mg via ORAL
  Filled 2012-02-11 (×5): qty 1

## 2012-02-11 MED ORDER — GUAIFENESIN ER 600 MG PO TB12
600.0000 mg | ORAL_TABLET | Freq: Every day | ORAL | Status: DC
Start: 2012-02-11 — End: 2012-02-14
  Administered 2012-02-11 – 2012-02-14 (×4): 600 mg via ORAL
  Filled 2012-02-11 (×4): qty 1

## 2012-02-11 SURGICAL SUPPLY — 55 items
BAG ZIPLOCK 12X15 (MISCELLANEOUS) ×2 IMPLANT
BANDAGE ELASTIC 6 VELCRO ST LF (GAUZE/BANDAGES/DRESSINGS) ×2 IMPLANT
BANDAGE ESMARK 6X9 LF (GAUZE/BANDAGES/DRESSINGS) ×1 IMPLANT
BLADE SAW SGTL 13.0X1.19X90.0M (BLADE) ×2 IMPLANT
BNDG ESMARK 6X9 LF (GAUZE/BANDAGES/DRESSINGS) ×2
BOWL SMART MIX CTS (DISPOSABLE) ×2 IMPLANT
CEMENT HV SMART SET (Cement) ×4 IMPLANT
CLOTH BEACON ORANGE TIMEOUT ST (SAFETY) ×2 IMPLANT
CUFF TOURN SGL QUICK 34 (TOURNIQUET CUFF) ×1
CUFF TRNQT CYL 34X4X40X1 (TOURNIQUET CUFF) ×1 IMPLANT
DECANTER SPIKE VIAL GLASS SM (MISCELLANEOUS) ×2 IMPLANT
DERMABOND ADVANCED (GAUZE/BANDAGES/DRESSINGS) ×1
DERMABOND ADVANCED .7 DNX12 (GAUZE/BANDAGES/DRESSINGS) ×1 IMPLANT
DRAPE EXTREMITY T 121X128X90 (DRAPE) ×2 IMPLANT
DRAPE POUCH INSTRU U-SHP 10X18 (DRAPES) ×2 IMPLANT
DRAPE U-SHAPE 47X51 STRL (DRAPES) ×2 IMPLANT
DRSG AQUACEL AG ADV 3.5X10 (GAUZE/BANDAGES/DRESSINGS) ×2 IMPLANT
DRSG TEGADERM 4X4.75 (GAUZE/BANDAGES/DRESSINGS) ×2 IMPLANT
DURAPREP 26ML APPLICATOR (WOUND CARE) ×2 IMPLANT
ELECT REM PT RETURN 9FT ADLT (ELECTROSURGICAL) ×2
ELECTRODE REM PT RTRN 9FT ADLT (ELECTROSURGICAL) ×1 IMPLANT
EVACUATOR 1/8 PVC DRAIN (DRAIN) ×2 IMPLANT
FACESHIELD LNG OPTICON STERILE (SAFETY) ×10 IMPLANT
GAUZE SPONGE 2X2 8PLY STRL LF (GAUZE/BANDAGES/DRESSINGS) ×1 IMPLANT
GLOVE BIOGEL PI IND STRL 7.5 (GLOVE) ×1 IMPLANT
GLOVE BIOGEL PI IND STRL 8 (GLOVE) ×1 IMPLANT
GLOVE BIOGEL PI INDICATOR 7.5 (GLOVE) ×1
GLOVE BIOGEL PI INDICATOR 8 (GLOVE) ×1
GLOVE ECLIPSE 8.0 STRL XLNG CF (GLOVE) ×2 IMPLANT
GLOVE ORTHO TXT STRL SZ7.5 (GLOVE) ×4 IMPLANT
GOWN BRE IMP PREV XXLGXLNG (GOWN DISPOSABLE) ×4 IMPLANT
GOWN STRL NON-REIN LRG LVL3 (GOWN DISPOSABLE) ×2 IMPLANT
HANDPIECE INTERPULSE COAX TIP (DISPOSABLE) ×1
IMMOBILIZER KNEE 20 (SOFTGOODS)
IMMOBILIZER KNEE 20 THIGH 36 (SOFTGOODS) IMPLANT
KIT BASIN OR (CUSTOM PROCEDURE TRAY) ×2 IMPLANT
MANIFOLD NEPTUNE II (INSTRUMENTS) ×2 IMPLANT
NDL SAFETY ECLIPSE 18X1.5 (NEEDLE) ×1 IMPLANT
NEEDLE HYPO 18GX1.5 SHARP (NEEDLE) ×1
NS IRRIG 1000ML POUR BTL (IV SOLUTION) ×4 IMPLANT
PACK TOTAL JOINT (CUSTOM PROCEDURE TRAY) ×2 IMPLANT
POSITIONER SURGICAL ARM (MISCELLANEOUS) ×2 IMPLANT
SET HNDPC FAN SPRY TIP SCT (DISPOSABLE) ×1 IMPLANT
SET PAD KNEE POSITIONER (MISCELLANEOUS) ×2 IMPLANT
SPONGE GAUZE 2X2 STER 10/PKG (GAUZE/BANDAGES/DRESSINGS) ×1
SUCTION FRAZIER 12FR DISP (SUCTIONS) ×2 IMPLANT
SUT MNCRL AB 4-0 PS2 18 (SUTURE) ×2 IMPLANT
SUT VIC AB 1 CT1 36 (SUTURE) ×6 IMPLANT
SUT VIC AB 2-0 CT1 27 (SUTURE) ×3
SUT VIC AB 2-0 CT1 TAPERPNT 27 (SUTURE) ×3 IMPLANT
SYR 50ML LL SCALE MARK (SYRINGE) ×2 IMPLANT
TOWEL OR 17X26 10 PK STRL BLUE (TOWEL DISPOSABLE) ×4 IMPLANT
TRAY FOLEY CATH 14FRSI W/METER (CATHETERS) ×2 IMPLANT
WATER STERILE IRR 1500ML POUR (IV SOLUTION) ×2 IMPLANT
WRAP KNEE MAXI GEL POST OP (GAUZE/BANDAGES/DRESSINGS) ×2 IMPLANT

## 2012-02-11 NOTE — Anesthesia Preprocedure Evaluation (Addendum)
Anesthesia Evaluation  Patient identified by MRN, date of birth, ID band Patient awake    Reviewed: Allergy & Precautions, H&P , NPO status , Patient's Chart, lab work & pertinent test results, reviewed documented beta blocker date and time   Airway Mallampati: II TM Distance: >3 FB Neck ROM: full    Dental No notable dental hx.    Pulmonary shortness of breath, asthma , sleep apnea and Continuous Positive Airway Pressure Ventilation ,  breath sounds clear to auscultation  Pulmonary exam normal       Cardiovascular Exercise Tolerance: Good hypertension, + Peripheral Vascular Disease + Valvular Problems/Murmurs Rhythm:regular Rate:Normal     Neuro/Psych PSYCHIATRIC DISORDERS  Neuromuscular disease negative neurological ROS  negative psych ROS   GI/Hepatic Neg liver ROS, GERD-  Medicated,  Endo/Other  diabetes, Type obesity  Renal/GU negative Renal ROS  negative genitourinary   Musculoskeletal   Abdominal   Peds  Hematology negative hematology ROS (+) anemia ,   Anesthesia Other Findings Discussed risks/benefits of spinal including headache, backache, failure, bleeding, infection, and nerve damage. Patient consents to spinal. Questions answered. Coagulation studies and platelet count acceptable.   Reproductive/Obstetrics negative OB ROS                           Anesthesia Physical Anesthesia Plan  ASA: III  Anesthesia Plan: Spinal   Post-op Pain Management:    Induction:   Airway Management Planned:   Additional Equipment:   Intra-op Plan:   Post-operative Plan:   Informed Consent: I have reviewed the patients History and Physical, chart, labs and discussed the procedure including the risks, benefits and alternatives for the proposed anesthesia with the patient or authorized representative who has indicated his/her understanding and acceptance.   Dental Advisory Given  Plan  Discussed with: CRNA  Anesthesia Plan Comments: (Discussed risks/benefits of spinal including headache, backache, failure (she has had a lumbar fusion.) bleeding, infection, and nerve damage. Patient consents to spinal. Questions answered. Coagulation studies and platelet count acceptable.   She does currently have right sciatica which has recently (one week ago) been treated with epidural steroid injections with some success.  Patient prefers spinal anesthesia.)      Anesthesia Quick Evaluation

## 2012-02-11 NOTE — Progress Notes (Signed)
Spoke with pt about wearing cpap at night because I saw in her chart that she wears at home. She said she never wears while in the hospital & does fine. She just wants to wear her 2L [place] tonight.  Jacqulynn Cadet RRT

## 2012-02-11 NOTE — Preoperative (Signed)
Beta Blockers   Reason not to administer Beta Blockers:Not Applicable 

## 2012-02-11 NOTE — Anesthesia Postprocedure Evaluation (Signed)
  Anesthesia Post-op Note  [name]  Procedure(s) Performed: Procedure(s) (LRB): TOTAL KNEE ARTHROPLASTY (Right)  Patient Location: PACU  Anesthesia Type: Spinal  Level of Consciousness: awake and alert   Airway and Oxygen Therapy: Patient Spontanous Breathing  Post-op Pain: mild  Post-op Assessment: Post-op Vital signs reviewed, Patient's Cardiovascular Status Stable, Respiratory Function Stable, Patent Airway and No signs of Nausea or vomiting  Last Vitals:  Filed Vitals:   02/11/12 1345  BP: 133/57  Pulse: 69  Temp:   Resp: 16    Post-op Vital Signs: stable   Complications: No apparent anesthesia complications. Moving feet. No back pain.

## 2012-02-11 NOTE — Progress Notes (Signed)
Utilization review completed.  

## 2012-02-11 NOTE — Transfer of Care (Signed)
Immediate Anesthesia Transfer of Care Note  [name]  Procedure(s) Performed: Procedure(s) (LRB) with comments: TOTAL KNEE ARTHROPLASTY (Right)  Patient Location: PACU  Anesthesia Type:Spinal  Level of Consciousness: awake, alert , oriented and patient cooperative  Airway & Oxygen Therapy: Patient Spontanous Breathing and Patient connected to face mask oxygen  Post-op Assessment: Report given to PACU RN and Post -op Vital signs reviewed and stable  Post vital signs: stable  Complications: No apparent anesthesia complications  Spinal level T10

## 2012-02-11 NOTE — Op Note (Signed)
NAME:  [name]                      MEDICAL RECORD NO.:  [id number]                             FACILITY:  [facility]      PHYSICIAN:  Madlyn Frankel. Charlann Boxer, M.D.  DATE OF BIRTH:  11/08/1945      DATE OF PROCEDURE:  02/11/2012                                     OPERATIVE REPORT         PREOPERATIVE DIAGNOSIS:  Right knee osteoarthritis.      POSTOPERATIVE DIAGNOSIS:  Right knee osteoarthritis.      FINDINGS:  The patient was noted to have complete loss of cartilage and   bone-on-bone arthritis with associated osteophytes in the medial and patellofemoral compartments of   the knee with a significant synovitis and associated effusion.      PROCEDURE:  Right total knee replacement.      COMPONENTS USED:  DePuy rotating platform posterior stabilized knee   system, a size 3 femur, 3 tibia, 12.5 mm PS insert, and 38 patellar   button.      SURGEON:  Madlyn Frankel. Charlann Boxer, M.D.      ASSISTANT:  Lanney Gins, PA-C.      ANESTHESIA:  Spinal.      SPECIMENS:  None.      COMPLICATION:  None.      DRAINS:  One Hemovac.  EBL: <100cc      TOURNIQUET TIME:   Total Tourniquet Time Documented: Thigh (Right) - 39 minutes .      The patient was stable to the recovery room.      INDICATION FOR PROCEDURE:  [name] is a 67 y.o. female patient of   mine.  The patient had been seen, evaluated, and treated conservatively in the   office with medication, activity modification, and injections.  The patient had   radiographic changes of bone-on-bone arthritis with endplate sclerosis and osteophytes noted.      The patient failed conservative measures including medication, injections, and activity modification, and at this point was ready for more definitive measures.   Based on the radiographic changes and failed conservative measures, the patient   decided to proceed with total knee replacement.  Risks of infection,   DVT, component failure, need for revision surgery, postop course, and   expectations were all   discussed and reviewed.  Consent was obtained for benefit of pain   relief.      PROCEDURE IN DETAIL:  The patient was brought to the operative theater.   Once adequate anesthesia, preoperative antibiotics, 900mg  of Clindamycin and 1gm of Vancomycin due to positive MRSA screening administered, the patient was positioned supine with the right thigh tourniquet placed.  The  right lower extremity was prepped and draped in sterile fashion.  A time-   out was performed identifying the patient, planned procedure, and   extremity.      The right lower extremity was placed in the [facility] leg holder.  The leg was   exsanguinated, tourniquet elevated to 250 mmHg.  A midline incision was   made followed by median parapatellar arthrotomy.  Following initial   exposure, attention was  first directed to the patella.  Precut   measurement was noted to be 22 mm.  I resected down to 13-14 mm and used a   38 patellar button to restore patellar height as well as cover the cut   surface.      The lug holes were drilled and a metal shim was placed to protect the   patella from retractors and saw blades.      At this point, attention was now directed to the femur.  The femoral   canal was opened with a drill, irrigated to try to prevent fat emboli.  An   intramedullary rod was passed at 3 degrees valgus, 9 mm of bone was   resected off the distal femur due to exam pre-operatively reveals slight passive hyperextension.  Following this resection, the tibia was   subluxated anteriorly.  Using the extramedullary guide, 10 mm of bone was resected off   the proximal lateral tibia.  We confirmed the gap would be   stable medially and laterally with a 10 mm insert as well as confirmed   the cut was perpendicular in the coronal plane, checking with an alignment rod.      Once this was done, I sized the femur to be a size 3 in the anterior-   posterior dimension, chose a standard component based  on medial and   lateral dimension.  The size 3 rotation block was then pinned in   position anterior referenced using the C-clamp to set rotation.  The   anterior, posterior, and  chamfer cuts were made without difficulty nor   notching making certain that I was along the anterior cortex to help   with flexion gap stability.      The final box cut was made off the lateral aspect of distal femur.      At this point, the tibia was sized to be a size 3, the size 3 tray was   then pinned in position through the medial third of the tubercle,   drilled, and keel punched.  Trial reduction was now carried with a 3 femur,  3 tibia, a 12.5 mm insert, and the 38 patella botton.  The knee was brought to   extension, full extension with good flexion stability with the patella   tracking through the trochlea without application of pressure.  Given   all these findings, the trial components removed.  Final components were   opened and cement was mixed.  The knee was irrigated with normal saline   solution and pulse lavage.  The synovial lining was   then injected with 0.25% Marcaine with epinephrine and 1 cc of Toradol,   total of 61 cc.      The knee was irrigated.  Final implants were then cemented onto clean and   dried cut surfaces of bone with the knee brought to extension with a 12.67mm trial insert.      Once the cement had fully cured, the excess cement was removed   throughout the knee.  I confirmed I was satisfied with the range of   motion and stability, and the final 12.5 mm PS insert was chosen.  It was   placed into the knee.      The tourniquet had been let down at 38 minutes.  No significant   hemostasis required.  The medium Hemovac drain was placed deep.  The   extensor mechanism was then reapproximated using #1 Vicryl with the knee  in flexion.  The   remaining wound was closed with 2-0 Vicryl and running 4-0 Monocryl.   The knee was cleaned, dried, dressed sterilely using  Dermabond and   Aquacel dressing.  Drain site dressed separately.  The patient was then   brought to recovery room in stable condition, tolerating the procedure   well.   Please note that Physician Assistant, Lanney Gins, PA-C, was present for the entirety of the case, and was utilized for pre-operative positioning, peri-operative retractor management, general facilitation of the procedure.  He was also utilized for primary wound closure at the end of the case.              Madlyn Frankel Charlann Boxer, M.D.

## 2012-02-11 NOTE — Progress Notes (Signed)
Pt recovery care complete [id number].. Out of PACU [phone].Procedural care complete 1453

## 2012-02-11 NOTE — Interval H&P Note (Signed)
History and Physical Interval Note:  02/11/2012 10:32 AM  [name]  has presented today for surgery, with the diagnosis of right knee oa   The various methods of treatment have been discussed with the patient and family. After consideration of risks, benefits and other options for treatment, the patient has consented to  Procedure(s) (LRB) with comments: TOTAL KNEE ARTHROPLASTY (Right) as a surgical intervention .  The patient's history has been reviewed, patient examined, no change in status, stable for surgery.  I have reviewed the patient's chart and labs.  Questions were answered to the patient's satisfaction.     [name]

## 2012-02-11 NOTE — Anesthesia Procedure Notes (Signed)
Spinal  Patient location during procedure: OR Staffing Anesthesiologist: Azell Der Performed by: anesthesiologist  Preanesthetic Checklist Completed: patient identified, surgical consent, pre-op evaluation, timeout performed, IV checked, risks and benefits discussed and monitors and equipment checked Spinal Block Patient position: sitting Prep: Betadine Patient monitoring: heart rate, cardiac monitor, continuous pulse ox and blood pressure Approach: midline Location: L3-4 Injection technique: single-shot Needle Needle type: Sprotte  Needle gauge: 24 G Additional Notes Clear CSF obtained. No paresthesia. Tolerated well. Adequate level.

## 2012-02-12 ENCOUNTER — Other Ambulatory Visit: Payer: Self-pay | Admitting: Internal Medicine

## 2012-02-12 DIAGNOSIS — D500 Iron deficiency anemia secondary to blood loss (chronic): Secondary | ICD-10-CM | POA: Diagnosis not present

## 2012-02-12 DIAGNOSIS — Z6841 Body Mass Index (BMI) 40.0 and over, adult: Secondary | ICD-10-CM

## 2012-02-12 LAB — CBC
Hemoglobin: 10 g/dL — ABNORMAL LOW (ref 12.0–15.0)
Platelets: 222 10*3/uL (ref 150–400)
RBC: 3.26 MIL/uL — ABNORMAL LOW (ref 3.87–5.11)
WBC: 11.5 10*3/uL — ABNORMAL HIGH (ref 4.0–10.5)

## 2012-02-12 LAB — BASIC METABOLIC PANEL
CO2: 26 mEq/L (ref 19–32)
Glucose, Bld: 146 mg/dL — ABNORMAL HIGH (ref 70–99)
Potassium: 3.9 mEq/L (ref 3.5–5.1)
Sodium: 141 mEq/L (ref 135–145)

## 2012-02-12 MED ORDER — FLUTICASONE-SALMETEROL 250-50 MCG/DOSE IN AEPB: INHALATION_SPRAY | RESPIRATORY_TRACT | Status: DC

## 2012-02-12 NOTE — Progress Notes (Signed)
OT Cancellation Note  Patient Details Name: OKTOBER GLAZER MRN: [id number] DOB: 05-16-1945   Cancelled Treatment:    Reason Eval/Treat Not Completed:  (Pt screened - for SNF placement.  Will sign off.)  Joelie Schou M 02/12/2012, 2:26 PM

## 2012-02-12 NOTE — Progress Notes (Signed)
Pt did agree to try & wear cpap tonight, so setup at 12 cmH2O per home setting. She has her nasal prongs/circuit, so hooked it up to our machine with 2L O2 bleed in. Seems to be tolerating well at this time.  Jacqulynn Cadet RRT

## 2012-02-12 NOTE — Clinical Documentation Improvement (Signed)
BMI DOCUMENTATION CLARIFICATION QUERY  THIS DOCUMENT IS NOT A PERMANENT PART OF THE MEDICAL RECORD  TO RESPOND TO THE THIS QUERY, FOLLOW THE INSTRUCTIONS BELOW:  1. If needed, update documentation for the patient's encounter via the notes activity.  2. Access this query again and click edit on the In [employer].  3. After updating, or not, click F2 to complete all highlighted (required) fields concerning your review. Select "additional documentation in the medical record" OR "no additional documentation provided".  4. Click Sign note button.  5. The deficiency will fall out of your In Basket *Please let us know if you are not able to complete this workflow by phone or e-mail (listed below).         02/12/12 at 0928  Dear [name]  In an effort to better capture your patient's severity of illness, reflect appropriate length of stay and utilization of resources, a review of the patient medical record has revealed the following indicators.    Based on your clinical judgment, please clarify and document in a progress note and/or discharge summary the clinical condition associated with the following supporting information:  In responding to this query please exercise your independent judgment.  The fact that a query is asked, does not imply that any particular answer is desired or expected.   Pt's BMI=  43.02.  Please clarify whether or not BMI can be linked to one of he diagnoses listed below and document in pn  and d/c. Thank You!  [facility]: When linking BMI to a diagnosis please document both BMI and diagnosis together in pn for accuracy of severity of illness (SOI) and risk of mortality (ROM).    Possible Clinical conditions  Morbid Obesity W/ BMI= 43.02  Underweight w/BMI=  Other condition___________________  Cannot Clinically determine _____________  Risk Factors: Obesity, End stage arthritis  Sign & Symptoms:  Estimated Body mass index is  43.02 kg/(m^2) as calculated from the following:   Height as of 12/24/11: 4\' 11" (1.499 m).   Weight as of 12/24/11: 213 lb(96.616 kg).   Treatment Reg Diet   Reviewed: additional documentation in the medical record Which was put in the note and problem list once I was able to finish writing the note.  Thank You,  Enis Slipper  RN, BSN, MSN/Inf, CCDS Clinical Documentation Specialist [facility] Dept Pager: [phone] / E-mail: [email or web address]  Health Information Management [place]

## 2012-02-12 NOTE — Progress Notes (Signed)
Physical Therapy Treatment Patient Details Name: [name] MRN: [id number] DOB: Nov 06, 1945 Today's Date: 02/12/2012 Time: 7829-5621 PT Time Calculation (min): 24 min  PT Assessment / Plan / Recommendation Comments on Treatment Session  Pt progressing well with mobility, she is much more alert this session. She walked 83' with RW, excellent ROM R knee, approx 70* flexion.  Pt states she's planning to go [facility], but may be able to DC home based on her excellent progress.     Follow Up Recommendations  SNF;Home health PT (pt may be able to go home with HHPT depending on progress)     Does the patient have the potential to tolerate intense rehabilitation     Barriers to Discharge None      Equipment Recommendations  None recommended by PT    Recommendations for Other Services    Frequency 7X/week   Plan Discharge plan remains appropriate;Frequency remains appropriate    Precautions / Restrictions Precautions Precautions: Knee Required Braces or Orthoses: Knee Immobilizer - Right Knee Immobilizer - Right: Discontinue once straight leg raise with < 10 degree lag Restrictions Other Position/Activity Restrictions: WBAT   Pertinent Vitals/Pain *5/10  R knee, pain meds requested Ice applied to R knee after PT**    Mobility  Bed Mobility Details for Bed Mobility Assistance: min A RLE, pt 90% Transfers Transfers: Sit to Stand;Stand to Sit Sit to Stand: 4: Min guard;From chair/3-in-1 Stand to Sit: 4: Min guard;With armrests;With upper extremity assist;To bed Details for Transfer Assistance: VCs hand placement Ambulation/Gait Ambulation/Gait Assistance: 4: Min guard Ambulation Distance (Feet): 60 Feet Assistive device: Rolling walker Gait Pattern: Step-to pattern General Gait Details: VCs sequencing    Exercises Total Joint Exercises Ankle Circles/Pumps: AROM;Both;15 reps Quad Sets: AROM;Both;10 reps Short Arc Quad: Right;AROM;10 reps Heel Slides: AAROM;Right;10  reps Hip ABduction/ADduction: AAROM;Right;10 reps Straight Leg Raises: AROM;AAROM;Right;10 reps Goniometric ROM: approx 70* knee flexion AAROM   PT Diagnosis: Difficulty walking;Acute pain  PT Problem List: Decreased range of motion;Decreased strength;Decreased activity tolerance;Decreased mobility;Pain;Decreased knowledge of use of DME PT Treatment Interventions: Functional mobility training;Stair training;Gait training;DME instruction;Patient/family education;Therapeutic activities;Therapeutic exercise   PT Goals Acute Rehab PT Goals PT Goal Formulation: With patient Time For Goal Achievement: 02/12/12 Potential to Achieve Goals: Good Pt will go Supine/Side to Sit: with modified independence;with HOB 0 degrees PT Goal: Supine/Side to Sit - Progress: Goal set today Pt will go Sit to Stand: with modified independence;with upper extremity assist PT Goal: Sit to Stand - Progress: Progressing toward goal Pt will Ambulate: >150 feet;with modified independence PT Goal: Ambulate - Progress: Progressing toward goal Pt will Go Up / Down Stairs: 3-5 stairs;with min assist PT Goal: Up/Down Stairs - Progress: Goal set today Pt will Perform Home Exercise Program: Independently PT Goal: Perform Home Exercise Program - Progress: Progressing toward goal  Visit Information  Last PT Received On: 02/12/12 Assistance Needed: +1    Subjective Data  Subjective: I want to be able to get on the floor to play with my 18 m.o. grandaughter.  Patient Stated Goal: see above   Cognition  Overall Cognitive Status: Appears within functional limits for tasks assessed/performed Arousal/Alertness: Awake/alert Orientation Level: Appears intact for tasks assessed Behavior During Session: [facility] for tasks performed    Balance     End of Session PT - End of Session Equipment Utilized During Treatment: Right knee immobilizer Activity Tolerance: Patient tolerated treatment well Patient left: with call bell/phone  within reach;in bed;with family/visitor present Nurse Communication: Mobility status  GP     Ralene Bathe Kistler 02/12/2012, 1:37 PM [phone]

## 2012-02-12 NOTE — Clinical Documentation Improvement (Signed)
Anemia Blood Loss Clarification  THIS DOCUMENT IS NOT A PERMANENT PART OF THE MEDICAL RECORD  RESPOND TO THE THIS QUERY, FOLLOW THE INSTRUCTIONS BELOW:  1. If needed, update documentation for the patient's encounter via the notes activity.  2. Access this query again and click edit on the In [employer].  3. After updating, or not, click F2 to complete all highlighted (required) fields concerning your review. Select "additional documentation in the medical record" OR "no additional documentation provided".  4. Click Sign note button.  5. The deficiency will fall out of your In Basket *Please let us know if you are not able to complete this workflow by phone or e-mail (listed below).        02/12/12  Dear Gail Leep PA Marton Redwood  In an effort to better capture your patient's severity of illness, reflect appropriate length of stay and utilization of resources, a review of the patient medical record has revealed the following indicators.    Based on your clinical judgment, please clarify and document in a progress note and/or discharge summary the clinical condition associated with the following supporting information:  In responding to this query please exercise your independent judgment.  The fact that a query is asked, does not imply that any particular answer is desired or expected.    In this pt admitted for Right TKR a review of the medical record reveals the following:    Pre OP H/H=12.6/39.1  Post Op H/H= 10.0/30.3  EBL<100  H/O Anemia  Clarification Needed  Please clarify whether or not anemia in setting of recent R TKA can be further specified as one of the diagnoses listed below and document in pn or d/c summary.   Possible Clinical Conditions?   " Expected Acute Blood Loss Anemia  " Acute Blood Loss Anemia  " Acute on chronic blood loss anemia   " Other Condition________________  " Cannot Clinically Determine  Risk Factors: (recent surgery, pre op  anemia, EBL in OR)   Supporting Information:  Signs and Symptoms  End stage arthritis, right knee, H/O ANEMIA, IRON DEFICIENCY, Right TKA.  Diagnostics: ESL< 100cc  Pre Op H/H  Feb 01, 2012 Component     Latest Ref Rng 02/01/2012  Hemoglobin     12.0 - 15.0 g/dL 40.9  HCT     81.1 - 91.4 % 39.1   Post Op H/H Feb 12, 2012 Component     Latest Ref Rng 02/12/2012  Hemoglobin     12.0 - 15.0 g/dL 78.2 (L)  HCT     95.6 - 46.0 % 30.3 (L)    Treatments: Ferrous Sulfate NS IVF  Reviewed: additional documentation in the medical record. Which was put in the note and problem list once I was able to finish writing th note.  Thank You,  Enis Slipper  RN, BSN, MSN/Inf, CCDS Clinical Documentation Specialist [facility] Dept Pager: [phone] / E-mail: [email or web address]  Health Information Management [place]

## 2012-02-12 NOTE — Progress Notes (Signed)
   Subjective: 1 Day Post-Op Procedure(s) (LRB): TOTAL KNEE ARTHROPLASTY (Right)   Patient reports pain as mild, pain well controlled. Sleepy this morning, but otherwise no events throughout the night.   Objective:   VITALS:   Filed Vitals:   02/12/12 1016  BP: 110/66  Pulse: 60  Temp: 98.1 F (36.7 C)  Resp: 14    Neurovascular intact Dorsiflexion/Plantar flexion intact Incision: dressing C/D/I No cellulitis present Compartment soft  LABS  Basename 02/12/12 0524  HGB 10.0*  HCT 30.3*  WBC 11.5*  PLT 222     Basename 02/12/12 0524  NA 141  K 3.9  BUN 16  CREATININE 0.84  GLUCOSE 146*     Assessment/Plan: 1 Day Post-Op Procedure(s) (LRB): TOTAL KNEE ARTHROPLASTY (Right) HV drain d/c'ed Foley cath d/c'ed Advance diet Up with therapy D/C IV fluids Discharge to SNF eventually, when ready  Expected ABLA  Treated with iron and will observe  Morbid Obesity (BMI >40)  Estimated Body mass index is 42.03 kg/(m^2) as calculated from the following:   Height as of this encounter: 4' 11.843"[Documented 02/01/12[(1.52 m).   Weight as of this encounter: 214 lb 1.1 oz(97.1 kg). Patient also counseled that weight may inhibit the healing process Patient counseled that losing weight will help with future health issues      [name]. [name]   PAC  02/12/2012, 10:49 AM

## 2012-02-12 NOTE — Evaluation (Signed)
Physical Therapy Evaluation Patient Details Name: [name] MRN: [id number] DOB: May 24, 1945 Today's Date: 02/12/2012 Time: 4098-1191 PT Time Calculation (min): 22 min  PT Assessment / Plan / Recommendation Clinical Impression  67 y.o. female POD #1 for R TKA. Pt moving quite well, min A for bed mobility, transfers, and ambulating 4' with RW, distance limited by nausea. Expect good progress. Pt would benefit from acute PT to maximize safety and independence with mobility.     PT Assessment  Patient needs continued PT services    Follow Up Recommendations  SNF (pt may be able to go home with HHPT depending on progress)    Does the patient have the potential to tolerate intense rehabilitation      Barriers to Discharge None      Equipment Recommendations  None recommended by PT    Recommendations for Other Services     Frequency 7X/week    Precautions / Restrictions Precautions Precautions: Knee Required Braces or Orthoses: Knee Immobilizer - Right Knee Immobilizer - Right: Discontinue once straight leg raise with < 10 degree lag Restrictions Other Position/Activity Restrictions: WBAT   Pertinent Vitals/Pain *none at rest Ice applied after PT**      Mobility  Bed Mobility Bed Mobility: Supine to Sit Supine to Sit: 4: Min assist Details for Bed Mobility Assistance: min A RLE, pt 90% Transfers Transfers: Sit to Stand;Stand to Sit Sit to Stand: From bed;4: Min assist Stand to Sit: 4: Min guard;With armrests;To chair/3-in-1;With upper extremity assist Details for Transfer Assistance: VCs hand placement Ambulation/Gait Ambulation/Gait Assistance: 4: Min assist Ambulation Distance (Feet): 4 Feet Assistive device: Rolling walker Gait Pattern: Step-to pattern General Gait Details: VCs sequencing, distance limited by nausea    Shoulder Instructions     Exercises Total Joint Exercises Ankle Circles/Pumps: AROM;Both;15 reps Quad Sets: AROM;Both;10 reps Heel  Slides: AAROM;Right;10 reps Goniometric ROM: approx 65* R knee flexion   PT Diagnosis: Difficulty walking;Acute pain  PT Problem List: Decreased range of motion;Decreased strength;Decreased activity tolerance;Decreased mobility;Pain;Decreased knowledge of use of DME PT Treatment Interventions: Functional mobility training;Stair training;Gait training;DME instruction;Patient/family education;Therapeutic activities;Therapeutic exercise   PT Goals Acute Rehab PT Goals PT Goal Formulation: With patient Time For Goal Achievement: 02/12/12 Potential to Achieve Goals: Good Pt will go Supine/Side to Sit: with modified independence;with HOB 0 degrees PT Goal: Supine/Side to Sit - Progress: Goal set today Pt will go Sit to Stand: with modified independence;with upper extremity assist PT Goal: Sit to Stand - Progress: Goal set today Pt will Ambulate: >150 feet;with modified independence PT Goal: Ambulate - Progress: Goal set today Pt will Go Up / Down Stairs: 3-5 stairs;with min assist PT Goal: Up/Down Stairs - Progress: Goal set today Pt will Perform Home Exercise Program: Independently PT Goal: Perform Home Exercise Program - Progress: Goal set today  Visit Information  Last PT Received On: 02/12/12 Assistance Needed: +1    Subjective Data  Subjective: I used to walk 5 miles a day, so I'm not a couch potato.  Patient Stated Goal: return to work   Prior Functioning  Home Living Lives With: Spouse Available Help at Discharge: Family Type of Home: House Home Access: Stairs to enter Secretary/administrator of Steps: 3 Entrance Stairs-Rails: Right Home Layout: One level Bathroom Shower/Tub: Health visitor: Standard Home Adaptive Equipment: Bedside commode/3-in-1;Straight cane;Walker - rolling;Shower chair without back Prior Function Level of Independence: Independent with assistive device(s) Able to Take Stairs?: Yes Vocation: Full time employment Comments: used  straight cane but  didn't feel steady with it; works as Restaurant manager, 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    Cognition  Overall Cognitive Status: Appears within functional limits for tasks assessed/performed Arousal/Alertness: Awake/alert Orientation Level: Appears intact for tasks assessed Behavior During Session: [facility] for tasks performed    Extremity/Trunk Assessment Right Upper Extremity Assessment RUE ROM/Strength/Tone: Within functional levels Left Upper Extremity Assessment LUE ROM/Strength/Tone: Within functional levels Right Lower Extremity Assessment RLE ROM/Strength/Tone: Deficits RLE ROM/Strength/Tone Deficits: pt able to do SLR with RLE,  knee flexion AAROM approx 60*, ankle 5/5 RLE Sensation: WFL - Light Touch RLE Coordination: WFL - gross/fine motor Left Lower Extremity Assessment LLE ROM/Strength/Tone: Within functional levels LLE Sensation: WFL - Light Touch LLE Coordination: WFL - gross/fine motor Trunk Assessment Trunk Assessment: Normal   Balance    End of Session PT - End of Session Equipment Utilized During Treatment: Right knee immobilizer Activity Tolerance: Patient tolerated treatment well Patient left: in chair;with call bell/phone within reach Nurse Communication: Mobility status  GP     Ralene Bathe Kistler 02/12/2012, 9:42 AM  [phone]

## 2012-02-12 NOTE — Progress Notes (Signed)
Clinical Social Work Department BRIEF PSYCHOSOCIAL ASSESSMENT 02/12/2012  Patient:  [name], [name]     Account Number:  [id number]     Admit date:  02/11/2012  Clinical Social Worker:  Candie Chroman  Date/Time:  02/12/2012 01:27 PM  Referred by:  Physician  Date Referred:  02/12/2012 Referred for  SNF Placement   Other Referral:   Interview type:  Patient Other interview type:    PSYCHOSOCIAL DATA Living Status:  HUSBAND Admitted from facility:   Level of care:   Primary support name:  [name] Primary support relationship to patient:  CHILD, ADULT Degree of support available:   unclear    CURRENT CONCERNS Current Concerns  Post-Acute Placement   Other Concerns:    SOCIAL WORK ASSESSMENT / PLAN Pt is a 67 yr old female living at home prior to hospitalization. CSW met with pt / family to assist with d/c planning. Pt is interested in having ST Rehab at [facility]. SNF contacted and provided a bed offer. CSW will follow to assist with d/c planning to SNF.   Assessment/plan status:  Psychosocial Support/Ongoing Assessment of Needs Other assessment/ plan:   Information/referral to community resources:   None needed at this time.    PATIENT'S/FAMILY'S RESPONSE TO PLAN OF CARE: Pt would like to have rehab at [facility] following hospital d/c.    [name] LCSW [phone]

## 2012-02-12 NOTE — Progress Notes (Signed)
Clinical Social Work Department CLINICAL SOCIAL WORK PLACEMENT NOTE 02/12/2012  Patient:  [name], [name]  Account Number:  [id number] Admit date:  02/11/2012  Clinical Social Worker:  [name], LCSW  Date/time:  02/12/2012 01:47 PM  Clinical Social Work is seeking post-discharge placement for this patient at the following level of care:   SKILLED NURSING   (*CSW will update this form in Epic as items are completed)     Patient/family provided with [facility] Department of Clinical Social Work's list of facilities offering this level of care within the geographic area requested by the patient (or if unable, by the patient's family).    Patient/family informed of their freedom to choose among providers that offer the needed level of care, that participate in Medicare, Medicaid or managed care program needed by the patient, have an available bed and are willing to accept the patient.  02/12/2012  Patient/family informed of [facility]' ownership interest in [facility], as well as of the fact that they are under no obligation to receive care at this facility.  PASARR submitted to EDS on 02/12/2012 PASARR number received from [employer] on 02/12/2012  FL2 transmitted to all facilities in geographic area requested by pt/family on  02/12/2012 FL2 transmitted to all facilities within larger geographic area on   Patient informed that his/her managed care company has contracts with or will negotiate with  certain facilities, including the following:     Patient/family informed of bed offers received:   Patient chooses bed at  Physician recommends and patient chooses bed at    Patient to be transferred to  on   Patient to be transferred to facility by   The following physician request were entered in Epic:   Additional Comments:  [name] LCSW [phone]

## 2012-02-13 LAB — BASIC METABOLIC PANEL
Calcium: 8.7 mg/dL (ref 8.4–10.5)
GFR calc Af Amer: >90 mL/min (ref >90)
GFR calc non Af Amer: 86 mL/min — ABNORMAL LOW (ref >90)
Glucose, Bld: 133 mg/dL — ABNORMAL HIGH (ref 70–99)
Potassium: 3.6 mEq/L (ref 3.5–5.1)
Sodium: 134 mEq/L — ABNORMAL LOW (ref 135–145)

## 2012-02-13 LAB — CBC
Hemoglobin: 10.5 g/dL — ABNORMAL LOW (ref 12.0–15.0)
MCH: 30.5 pg (ref 26.0–34.0)
MCHC: 32.4 g/dL (ref 30.0–36.0)
Platelets: 206 10*3/uL (ref 150–400)

## 2012-02-13 MED ORDER — KETOROLAC TROMETHAMINE 15 MG/ML IJ SOLN
15.0000 mg | Freq: Four times a day (QID) | INTRAMUSCULAR | Status: DC
  Administered 2012-02-13 – 2012-02-14 (×5): 15 mg via INTRAVENOUS
  Filled 2012-02-13 (×9): qty 1

## 2012-02-13 NOTE — Progress Notes (Signed)
Placed patient on CPAP with home nasal pillows. Sterile water added to fill line , set to 12cm H2O per home settings. 2L of oxygen bled in. Husband at bedside patient states she is comfortable.

## 2012-02-13 NOTE — Progress Notes (Signed)
Physical Therapy Treatment Patient Details Name: [name] MRN: [id number] DOB: 1945/08/21 Today's Date: 02/13/2012 Time: 4098-1191 PT Time Calculation (min): 10 min  PT Assessment / Plan / Recommendation Comments on Treatment Session  Pt reports increased pain today. Medicated prior to session-very drowsy. Pt having trouble keeping eyes open. Deferred ambulation at this time.Will see a 2nd time for ambulation later today.     Follow Up Recommendations  SNF     Does the patient have the potential to tolerate intense rehabilitation     Barriers to Discharge        Equipment Recommendations  None recommended by PT    Recommendations for Other Services    Frequency 7X/week   Plan Discharge plan remains appropriate    Precautions / Restrictions Precautions Precautions: Knee Required Braces or Orthoses: Knee Immobilizer - Right Knee Immobilizer - Right: Discontinue once straight leg raise with < 10 degree lag Restrictions Weight Bearing Restrictions: No RLE Weight Bearing: Weight bearing as tolerated   Pertinent Vitals/Pain 9/10 R knee    Mobility       Exercises Total Joint Exercises Ankle Circles/Pumps: AROM;Both;10 reps;Seated Quad Sets:  (pt too drowsy to complete these) Short Arc Quad: AAROM;Right;10 reps;Seated Heel Slides: AAROM;Right;10 reps;Seated Hip ABduction/ADduction: AAROM;Right;10 reps;Seated Straight Leg Raises: AAROM;Right;10 reps;Seated   PT Diagnosis:    PT Problem List:   PT Treatment Interventions:     PT Goals Acute Rehab PT Goals Pt will Perform Home Exercise Program: with supervision, verbal cues required/provided PT Goal: Perform Home Exercise Program - Progress: Progressing toward goal  Visit Information  Last PT Received On: 02/13/12 Assistance Needed: +1    Subjective Data  Subjective: "I'm so sore today. I can't even lift this leg. I could on yesterday" Patient Stated Goal: Stronger. Less pain.    Cognition  Overall  Cognitive Status: Appears within functional limits for tasks assessed/performed Arousal/Alertness: Awake/alert Orientation Level: Appears intact for tasks assessed Behavior During Session: [facility] for tasks performed    Balance     End of Session PT - End of Session Activity Tolerance: Patient limited by pain;Patient limited by fatigue Patient left: in chair;with call bell/phone within reach;with family/visitor present   GP     Rebeca Alert [facility] 02/13/2012, 3:37 PM [phone]

## 2012-02-13 NOTE — Progress Notes (Signed)
Patient continues to report uncontrolled pain. Receive order to give toradol 15mg  IV every 6hrs.

## 2012-02-13 NOTE — Progress Notes (Signed)
   Subjective: 2 Days Post-Op Procedure(s) (LRB): TOTAL KNEE ARTHROPLASTY (Right)   Patient reports pain as moderate, continues to state that she is having a lot of pain. Still is very sleepy when seen this morning. No other events throughout the night.   Objective:   VITALS:   Filed Vitals:   02/13/12 0700  BP: 184/78  Pulse: 75  Temp: 98.7 F (37.1 C)  Resp: 16    Neurovascular intact Dorsiflexion/Plantar flexion intact Incision: dressing C/D/I No cellulitis present Compartment soft  LABS  Basename 02/13/12 0520 02/12/12 0524  HGB 10.5* 10.0*  HCT 32.4* 30.3*  WBC 10.1 11.5*  PLT 206 222     Basename 02/13/12 0520 02/12/12 0524  NA 134* 141  K 3.6 3.9  BUN 18 16  CREATININE 0.77 0.84  GLUCOSE 133* 146*     Assessment/Plan: 2 Days Post-Op Procedure(s) (LRB): TOTAL KNEE ARTHROPLASTY (Right) Was called over the night do to pain, added Toradol Up with therapy Discharge to SNF when ready, possibly tomorrow  Expected ABLA  Treated with iron and will observe   Morbid Obesity (BMI >40)  Estimated Body mass index is 42.03 kg/(m^2) as calculated from the following:     Height as of this encounter: 4' 11.843"[Documented 02/01/12[(1.52 m).     Weight as of this encounter: 214 lb 1.1 oz(97.1 kg).  Patient also counseled that weight may inhibit the healing process  Patient counseled that losing weight will help with future health issues    Anastasio Auerbach. Kathya Wilz   PAC  02/13/2012, 10:15 AM

## 2012-02-13 NOTE — Progress Notes (Signed)
Physical Therapy Treatment Patient Details Name: [name] MRN: [id number] DOB: 01-17-1946 Today's Date: 02/13/2012 Time: 7829-5621 PT Time Calculation (min): 24 min  PT Assessment / Plan / Recommendation Comments on Treatment Session  Increased difficulty ambulating on today due to pain. Moderate encouragement for progressing of mobility/ambulation distance.Recommend SNF.     Follow Up Recommendations  SNF     Does the patient have the potential to tolerate intense rehabilitation     Barriers to Discharge        Equipment Recommendations  None recommended by PT    Recommendations for Other Services    Frequency 7X/week   Plan Discharge plan remains appropriate    Precautions / Restrictions Precautions Precautions: Knee Required Braces or Orthoses: Knee Immobilizer - Right Knee Immobilizer - Right: Discontinue once straight leg raise with < 10 degree lag Restrictions Weight Bearing Restrictions: No RLE Weight Bearing: Weight bearing as tolerated   Pertinent Vitals/Pain 9/10 R knee    Mobility  Bed Mobility Bed Mobility: Supine to Sit;Sit to Supine Supine to Sit: 4: Min assist;HOB elevated;With rails Sit to Supine: 4: Min assist;HOB elevated;With rail Details for Bed Mobility Assistance: Assist for R LE off/onto bed.  Transfers Transfers: Sit to Stand;Stand to Sit Sit to Stand: 4: Min assist;From bed;From elevated surface Stand to Sit: 4: Min guard;With upper extremity assist;To bed Details for Transfer Assistance: VCs safety, technique, hand placement. Assist to rise, stabilize.  Ambulation/Gait Ambulation/Gait Assistance: 4: Min assist Ambulation Distance (Feet): 40 Feet Assistive device: Rolling walker Ambulation/Gait Assistance Details: Assist to stabilize intermittently during ambulation. Pt having increased difficulty due to pain. VCs safety, posture, distance from RW, and for pt to increase step length. 2-3 short standing rest breaks needed. Slow gait  speed.  Gait Pattern: Step-to pattern;Decreased stride length;Decreased step length - right;Decreased step length - left;Antalgic;Trunk flexed;Decreased stance time - right;Decreased weight shift to right    Exercises    PT Diagnosis:    PT Problem List:   PT Treatment Interventions:     PT Goals Acute Rehab PT Goals Pt will go Supine/Side to Sit: with modified independence PT Goal: Supine/Side to Sit - Progress: Progressing toward goal Pt will go Sit to Stand: with modified independence PT Goal: Sit to Stand - Progress: Progressing toward goal Pt will Ambulate: >150 feet;with modified independence;with least restrictive assistive device PT Goal: Ambulate - Progress: Progressing toward goal Pt will Perform Home Exercise Program: with supervision, verbal cues required/provided PT Goal: Perform Home Exercise Program - Progress: Progressing toward goal  Visit Information  Last PT Received On: 02/13/12 Assistance Needed: +1    Subjective Data  Subjective: "Its hard to do this when you're hurting" Patient Stated Goal: Less pain   Cognition  Overall Cognitive Status: Appears within functional limits for tasks assessed/performed Arousal/Alertness: Awake/alert Orientation Level: Appears intact for tasks assessed Behavior During Session: [facility] for tasks performed    Balance     End of Session PT - End of Session Equipment Utilized During Treatment: Gait belt;Right knee immobilizer Activity Tolerance: Patient limited by pain;Patient limited by fatigue Patient left: with call bell/phone within reach;in bed;with family/visitor present   GP     Rebeca Alert [facility] 02/13/2012, 3:42 PM [phone]

## 2012-02-14 DIAGNOSIS — R269 Unspecified abnormalities of gait and mobility: Secondary | ICD-10-CM | POA: Diagnosis not present

## 2012-02-14 DIAGNOSIS — D62 Acute posthemorrhagic anemia: Secondary | ICD-10-CM | POA: Diagnosis not present

## 2012-02-14 DIAGNOSIS — M79609 Pain in unspecified limb: Secondary | ICD-10-CM | POA: Diagnosis not present

## 2012-02-14 DIAGNOSIS — M1710 Unilateral primary osteoarthritis, unspecified knee: Secondary | ICD-10-CM | POA: Diagnosis not present

## 2012-02-14 DIAGNOSIS — R1312 Dysphagia, oropharyngeal phase: Secondary | ICD-10-CM | POA: Diagnosis not present

## 2012-02-14 DIAGNOSIS — F329 Major depressive disorder, single episode, unspecified: Secondary | ICD-10-CM | POA: Diagnosis not present

## 2012-02-14 DIAGNOSIS — M25569 Pain in unspecified knee: Secondary | ICD-10-CM | POA: Diagnosis not present

## 2012-02-14 DIAGNOSIS — M6281 Muscle weakness (generalized): Secondary | ICD-10-CM | POA: Diagnosis not present

## 2012-02-14 DIAGNOSIS — F3289 Other specified depressive episodes: Secondary | ICD-10-CM | POA: Diagnosis not present

## 2012-02-14 DIAGNOSIS — N318 Other neuromuscular dysfunction of bladder: Secondary | ICD-10-CM | POA: Diagnosis not present

## 2012-02-14 DIAGNOSIS — K5900 Constipation, unspecified: Secondary | ICD-10-CM | POA: Diagnosis not present

## 2012-02-14 DIAGNOSIS — F411 Generalized anxiety disorder: Secondary | ICD-10-CM | POA: Diagnosis not present

## 2012-02-14 DIAGNOSIS — S99929A Unspecified injury of unspecified foot, initial encounter: Secondary | ICD-10-CM | POA: Diagnosis not present

## 2012-02-14 DIAGNOSIS — R279 Unspecified lack of coordination: Secondary | ICD-10-CM | POA: Diagnosis not present

## 2012-02-14 DIAGNOSIS — T7840XA Allergy, unspecified, initial encounter: Secondary | ICD-10-CM | POA: Diagnosis not present

## 2012-02-14 DIAGNOSIS — M1990 Unspecified osteoarthritis, unspecified site: Secondary | ICD-10-CM | POA: Diagnosis not present

## 2012-02-14 DIAGNOSIS — G4700 Insomnia, unspecified: Secondary | ICD-10-CM | POA: Diagnosis not present

## 2012-02-14 DIAGNOSIS — R491 Aphonia: Secondary | ICD-10-CM | POA: Diagnosis not present

## 2012-02-14 DIAGNOSIS — G4730 Sleep apnea, unspecified: Secondary | ICD-10-CM | POA: Diagnosis not present

## 2012-02-14 DIAGNOSIS — K219 Gastro-esophageal reflux disease without esophagitis: Secondary | ICD-10-CM | POA: Diagnosis not present

## 2012-02-14 DIAGNOSIS — S8990XA Unspecified injury of unspecified lower leg, initial encounter: Secondary | ICD-10-CM | POA: Diagnosis not present

## 2012-02-14 DIAGNOSIS — Z471 Aftercare following joint replacement surgery: Secondary | ICD-10-CM | POA: Diagnosis not present

## 2012-02-14 DIAGNOSIS — Z96659 Presence of unspecified artificial knee joint: Secondary | ICD-10-CM | POA: Diagnosis not present

## 2012-02-14 DIAGNOSIS — J45909 Unspecified asthma, uncomplicated: Secondary | ICD-10-CM | POA: Diagnosis not present

## 2012-02-14 MED ORDER — HYDROMORPHONE HCL 2 MG PO TABS: 2.0000 mg | ORAL_TABLET | ORAL | Status: DC | PRN

## 2012-02-14 MED ORDER — POLYETHYLENE GLYCOL 3350 17 G PO PACK: 17.0000 g | PACK | Freq: Two times a day (BID) | ORAL | Status: DC

## 2012-02-14 MED ORDER — METHOCARBAMOL 500 MG PO TABS: 500.0000 mg | ORAL_TABLET | Freq: Four times a day (QID) | ORAL | Status: DC | PRN

## 2012-02-14 MED ORDER — ASPIRIN EC 325 MG PO TBEC: 325.0000 mg | DELAYED_RELEASE_TABLET | Freq: Two times a day (BID) | ORAL | Status: DC

## 2012-02-14 MED ORDER — DIPHENHYDRAMINE HCL 25 MG PO CAPS: 25.0000 mg | ORAL_CAPSULE | Freq: Four times a day (QID) | ORAL | Status: DC | PRN

## 2012-02-14 MED ORDER — DSS 100 MG PO CAPS: 100.0000 mg | ORAL_CAPSULE | Freq: Two times a day (BID) | ORAL | Status: DC

## 2012-02-14 NOTE — Progress Notes (Signed)
Physical Therapy Treatment Patient Details Name: ADDILYNE BACKS MRN: [id number] DOB: 1945-11-10 Today's Date: 02/14/2012 Time: 0865-7846 PT Time Calculation (min): 15 min  PT Assessment / Plan / Recommendation Comments on Treatment Session  Pt planning to d/c to [facility] on today. Performed some ROM exercises (limited due to pt wearing KI under pants leg). Short ambulation distance in hallway. Recommend SNF.     Follow Up Recommendations  SNF     Does the patient have the potential to tolerate intense rehabilitation     Barriers to Discharge        Equipment Recommendations  None recommended by PT    Recommendations for Other Services    Frequency 7X/week   Plan Discharge plan remains appropriate    Precautions / Restrictions Precautions Precautions: Knee Required Braces or Orthoses: Knee Immobilizer - Right Knee Immobilizer - Right: Discontinue once straight leg raise with < 10 degree lag Restrictions Weight Bearing Restrictions: No RLE Weight Bearing: Weight bearing as tolerated   Pertinent Vitals/Pain R knee-7/10 with activity. RN notified of need for pain meds.     Mobility  Transfers Transfers: Sit to Stand;Stand to Sit Sit to Stand: 4: Min assist;From chair/3-in-1 Stand to Sit: 4: Min assist;To chair/3-in-1 Details for Transfer Assistance: VCs safety, technique, hand placement. Assist to rise, stabilize, control descent.  Ambulation/Gait Ambulation Distance (Feet): 40 Feet Assistive device: Rolling walker Ambulation/Gait Assistance Details: Assist to stabilize intermittently during ambulation. Pt having increased difficulty with WBing on R LE due to pain. VCs safety, posture, distance from RW, and for pt to increase step length. 2 short standing rest breaks needed Gait Pattern: Step-to pattern;Decreased stride length;Decreased step length - right;Decreased step length - left;Antalgic    Exercises Total Joint Exercises Ankle Circles/Pumps: AROM;Both;10 reps;Seated Hip  ABduction/ADduction: AAROM;Right;10 reps;Seated Straight Leg Raises: AAROM;Right;10 reps;Seated   PT Diagnosis:    PT Problem List:   PT Treatment Interventions:     PT Goals Acute Rehab PT Goals Pt will go Sit to Stand: with modified independence PT Goal: Sit to Stand - Progress: Progressing toward goal Pt will Ambulate: >150 feet;with modified independence;with least restrictive assistive device PT Goal: Ambulate - Progress: Progressing toward goal Pt will Perform Home Exercise Program: with supervision, verbal cues required/provided PT Goal: Perform Home Exercise Program - Progress: Progressing toward goal  Visit Information  Last PT Received On: 02/14/12 Assistance Needed: +1    Subjective Data  Subjective: "I'll try" Patient Stated Goal: Less pain   Cognition       Balance     End of Session PT - End of Session Equipment Utilized During Treatment: Gait belt;Right knee immobilizer Activity Tolerance: Patient limited by pain;Patient limited by fatigue Patient left: in chair;with call bell/phone within reach;with family/visitor present   GP     Rebeca Alert [facility] 02/14/2012, 1:09 PM [phone]

## 2012-02-14 NOTE — Progress Notes (Signed)
Pt to be d/c to [facility] today. [facility] pick up has be arranged ( call placed at 2:30pm ). Due to heavy volume, [facility] is unable to respond immediately. NSG to alert pt/family.  [name] LCSW [phone]

## 2012-02-14 NOTE — Discharge Summary (Signed)
Physician Discharge Summary  Patient ID: [name] MRN: [id number] DOB/AGE: 1945/10/16 67 y.o.  Admit date: 02/11/2012 Discharge date:  02/14/2012  Procedures:  Procedure(s) (LRB): TOTAL KNEE ARTHROPLASTY (Right)  Attending Physician:  Dr. Durene Romans   Admission Diagnoses:   Right knee OA / pain  Discharge Diagnoses:  Principal Problem:  *S/P right TKA Active Problems:  Expected blood loss anemia  Morbid obesity with BMI of 40.0-44.9, adult  Diagnosis  . Obesity  . Esophageal reflux  . Fibromyalgia  . Asthma  . Allergy  . Sleep apnea, obstructive  . Bronchitis, acute  . Otitis media of both ears  . Depression with anxiety  . Back pain  . Sinusitis  . Fatigue  . Knee pain, right  . Otitis media  . Varicose veins of lower limb  . Otitis media of left ear  . Motion sickness  . Sinusitis  . Nausea & vomiting  . Heart murmur  . Panic attacks  . Arthritis  . Anemia  . Cancer    HPI: [name], 67 y.o. female, has a history of pain and functional disability in the right knee due to arthritis and has failed non-surgical conservative treatments for greater than 12 weeks to includeNSAID's and/or analgesics, corticosteriod injections and activity modification. Onset of symptoms was gradual, starting 5 years ago with gradually worsening course since that time. The patient noted no past surgery on the right knee(s). Patient currently rates pain in the right knee(s) at 8 out of 10 with activity. Patient has night pain, worsening of pain with activity and weight bearing, pain that interferes with activities of daily living and pain with passive range of motion. Patient has evidence of periarticular osteophytes and joint space narrowing by imaging studies. There is no active infection. Risks, benefits and expectations were discussed with the patient. Patient understand the risks, benefits and expectations and wishes to proceed with surgery.  PCP: Danise Edge, MD    Discharged Condition: good  Hospital Course:  Patient underwent the above stated procedure on 02/11/2012. Patient tolerated the procedure well and brought to the recovery room in good condition and subsequently to the floor.  POD #1 BP: 110/66 ; Pulse: 60 ; Temp: 98.1 F (36.7 C) ; Resp: 14  Pt's foley was removed, as well as the hemovac drain removed. IV was changed to a saline lock. Patient reports pain as mild, pain well controlled. Sleepy this morning, but otherwise no events throughout the night.  Neurovascular intact, dorsiflexion/plantar flexion intact, incision: dressing C/D/I, no cellulitis present and compartment soft.   LABS  Basename  02/12/12 0524   HGB  10.0  HCT  30.3   POD #2  BP: 184/78 ; Pulse: 75 ; Temp: 98.7 F (37.1 C) ; Resp: 16  Patient reports pain as moderate, continues to state that she is having a lot of pain. Still is very sleepy when seen this morning. No other events throughout the night. Neurovascular intact, dorsiflexion/plantar flexion intact, incision: dressing C/D/I, no cellulitis present and compartment soft.   LABS  Basename  02/13/12 0520   HGB  10.5  HCT  32.4   POD #3  BP: 125/83 ; Pulse: 77 ; Temp: 98.1 F (36.7 C) ; Resp: 17  Patient reports pain as mild at rest, but increases significantly with weight bearing. No other events throughout the night. Ready to be discharged to SNF. Neurovascular intact, dorsiflexion/plantar flexion intact, incision: dressing C/D/I, no cellulitis present and compartment soft.  LABS   No new labs   Discharge Exam: General appearance: alert, cooperative and no distress Extremities: Homans sign is negative, no sign of DVT, no edema, redness or tenderness in the calves or thighs and no ulcers, gangrene or trophic changes  Disposition:   SNF ([facility]) with follow up in 2 weeks   Follow-up Information    Follow up with [name], MD. Schedule an appointment as soon as possible for a visit in 2 weeks.    Contact information:   [address] [place] [address] [phone]          Discharge Orders    Future Appointments: Provider: Department: Dept Phone: Center:   08/01/2012 4:00 PM Waymon Budge, MD [facility] [phone] None     Future Orders Please Complete By Expires   Diet - low sodium heart healthy      Call MD / Call 911      Comments:   If you experience chest pain or shortness of breath, CALL 911 and be transported to the hospital emergency room.  If you develope a fever above 101 F, pus (white drainage) or increased drainage or redness at the wound, or calf pain, call your surgeon's office.   Discharge instructions      Comments:   Maintain surgical dressing for 10-14 days, then replace with gauze and tape. Keep the area dry and clean until follow up. Follow up in 2 weeks at [facility]. Call with any questions or concerns.   Constipation Prevention      Comments:   Drink plenty of fluids.  Prune juice may be helpful.  You may use a stool softener, such as Colace (over the counter) 100 mg twice a day.  Use MiraLax (over the counter) for constipation as needed.   Increase activity slowly as tolerated      Weight bearing as tolerated      TED hose      Comments:   Use stockings (TED hose) for 2 weeks on both leg(s).  You may remove them at night for sleeping.   Change dressing      Comments:   Maintain surgical dressing for 10-14 days, then change the dressing daily with sterile 4 x 4 inch gauze dressing and tape. Keep the area dry and clean.   Driving restrictions      Comments:   No driving for 4 weeks      Current Discharge Medication List    START taking these medications   Details  aspirin EC 325 MG tablet Take 1 tablet (325 mg total) by mouth 2 (two) times daily. X 4 weeks Qty: 60 tablet, Refills: 0    diphenhydrAMINE (BENADRYL) 25 mg capsule Take 1 capsule (25 mg total) by mouth every 6 (six) hours as needed for  itching, allergies or sleep. Qty: 30 capsule    docusate sodium 100 MG CAPS Take 100 mg by mouth 2 (two) times daily. Qty: 10 capsule    HYDROmorphone (DILAUDID) 2 MG tablet Take 1-2 tablets (2-4 mg total) by mouth every 4 (four) hours as needed for pain. Qty: 120 tablet, Refills: 0    methocarbamol (ROBAXIN) 500 MG tablet Take 1 tablet (500 mg total) by mouth every 6 (six) hours as needed (muscle spasms). Qty: 50 tablet, Refills: 0    polyethylene glycol (MIRALAX / GLYCOLAX) packet Take 17 g by mouth 2 (two) times daily. Qty: 14 each      CONTINUE these medications  which have NOT CHANGED   Details  ALPRAZolam (XANAX) 0.25 MG tablet Take 0.25 mg by mouth daily as needed. For anxiety    bifidobacterium infantis (ALIGN) capsule Take 1 capsule by mouth daily.     !! calcitRIOL (ROCALTROL) 0.25 MCG capsule Take 0.25 mcg by mouth daily.    clonazePAM (KLONOPIN) 1 MG tablet Take 1 mg by mouth at bedtime.     DULoxetine (CYMBALTA) 60 MG capsule Take 120 mg by mouth every morning.    guaiFENesin (MUCINEX) 600 MG 12 hr tablet Take 600 mg by mouth daily.    ipratropium (ATROVENT) 0.06 % nasal spray Place 2 sprays into the nose 2 (two) times daily.     lamoTRIgine (LAMICTAL) 100 MG tablet Take 100 mg by mouth at bedtime.     levocetirizine (XYZAL) 5 MG tablet Take 5 mg by mouth every morning.     methylphenidate (RITALIN) 10 MG tablet Take 10 mg by mouth 2 (two) times daily.    montelukast (SINGULAIR) 10 MG tablet Take 10 mg by mouth every morning.    !! NON FORMULARY once a week.     !! NON FORMULARY CPAP- set on [employer].    Olopatadine HCl (PATANASE) 0.6 % SOLN Place 2 sprays into the nose every morning.     pantoprazole (PROTONIX) 40 MG tablet Take 40 mg by mouth at bedtime.    ranitidine (ZANTAC) 150 MG tablet Take 150 mg by mouth every morning.    solifenacin (VESICARE) 10 MG tablet Take 10 mg by mouth every morning.    traZODone (DESYREL) 50 MG tablet Take 50 mg by  mouth at bedtime.     !! calcitRIOL (ROCALTROL) 0.5 MCG capsule Take 0.5 mcg by mouth daily.     Fluticasone-Salmeterol (ADVAIR DISKUS) 250-50 MCG/DOSE AEPB inhale 1 puff by mouth twice a day Qty: 180 each, Refills: 1     !! - Potential duplicate medications found. Please discuss with provider.       Signed: Anastasio Auerbach. Hardie Veltre   PAC  02/14/2012, 10:20 AM

## 2012-02-14 NOTE — Progress Notes (Signed)
   Subjective: 3 Days Post-Op Procedure(s) (LRB): TOTAL KNEE ARTHROPLASTY (Right)   Patient reports pain as mild at rest, but increases significantly with weight bearing. No other events throughout the night. Ready to be discharged to SNF.  Objective:   VITALS:   Filed Vitals:   02/14/12  BP: 125/83  Pulse: 77  Temp: 98.1 F (36.7 C)   Resp: 17    Neurovascular intact Dorsiflexion/Plantar flexion intact Incision: dressing C/D/I No cellulitis present Compartment soft  LABS  Basename 02/13/12 0520 02/12/12 0524  HGB 10.5* 10.0*  HCT 32.4* 30.3*  WBC 10.1 11.5*  PLT 206 222     Basename 02/13/12 0520 02/12/12 0524  NA 134* 141  K 3.6 3.9  BUN 18 16  CREATININE 0.77 0.84  GLUCOSE 133* 146*     Assessment/Plan: 3 Days Post-Op Procedure(s) (LRB): TOTAL KNEE ARTHROPLASTY (Right) Up with therapy Discharge to SNF today Follow up in 2 weeks at [facility]. Follow up with OLIN,Benjie Ricketson D in 2 weeks.  Contact information:  [facility] [address], [address] [place] [address] [phone]     Morbid Obesity (BMI >40)  Estimated Body mass index is 42.03 kg/(m^2) as calculated from the following:     Height as of this encounter: 4' 11.843"[Documented 02/01/12[(1.52 m).     Weight as of this encounter: 214 lb 1.1 oz(97.1 kg).  Patient also counseled that weight may inhibit the healing process  Patient counseled that losing weight will help with future health issues      Anastasio Auerbach. Darion Juhasz   PAC  02/14/2012, 10:09 AM

## 2012-02-14 NOTE — Progress Notes (Signed)
Attempted to call report to [facility], call transferred no answer,  [name]

## 2012-02-14 NOTE — Care Management Note (Signed)
    Page 1 of 1   02/14/2012     5:20:44 PM   CARE MANAGEMENT NOTE 02/14/2012  Patient:  [name], [name]   Account Number:  [id number]  Date Initiated:  02/14/2012  Documentation initiated by:  Colleen Can  Subjective/Objective Assessment:   dx rt total knee replacemnt     Action/Plan:   SNF rehab   Anticipated DC Date:  02/14/2012   Anticipated DC Plan:  SKILLED NURSING FACILITY  In-house referral  Clinical Social Worker      DC Planning Services  CM consult      Choice offered to / List presented to:             Status of service:  Completed, signed off Medicare Important Message given?  NA - LOS <3 / Initial given by admissions (If response is "NO", the following Medicare IM given date fields will be blank) Date Medicare IM given:   Date Additional Medicare IM given:    Discharge Disposition:  SKILLED NURSING FACILITY  Per UR Regulation:    If discussed at Long Length of Stay Meetings, dates discussed:    Comments:

## 2012-02-15 DIAGNOSIS — K5900 Constipation, unspecified: Secondary | ICD-10-CM | POA: Diagnosis not present

## 2012-02-15 DIAGNOSIS — J45909 Unspecified asthma, uncomplicated: Secondary | ICD-10-CM | POA: Diagnosis not present

## 2012-02-15 DIAGNOSIS — M1710 Unilateral primary osteoarthritis, unspecified knee: Secondary | ICD-10-CM | POA: Diagnosis not present

## 2012-02-15 DIAGNOSIS — D62 Acute posthemorrhagic anemia: Secondary | ICD-10-CM | POA: Diagnosis not present

## 2012-02-15 NOTE — Progress Notes (Signed)
Clinical Social Work Department CLINICAL SOCIAL WORK PLACEMENT NOTE 02/15/2012  Patient:  [name], [name]  Account Number:  [id number] Admit date:  02/11/2012  Clinical Social Worker:  [name], LCSW  Date/time:  02/12/2012 01:47 PM  Clinical Social Work is seeking post-discharge placement for this patient at the following level of care:   SKILLED NURSING   (*CSW will update this form in Epic as items are completed)     Patient/family provided with [facility] Department of Clinical Social Work's list of facilities offering this level of care within the geographic area requested by the patient (or if unable, by the patient's family).    Patient/family informed of their freedom to choose among providers that offer the needed level of care, that participate in Medicare, Medicaid or managed care program needed by the patient, have an available bed and are willing to accept the patient.  02/12/2012  Patient/family informed of [facility]' ownership interest in [facility], as well as of the fact that they are under no obligation to receive care at this facility.  PASARR submitted to EDS on 02/12/2012 PASARR number received from EDS on 02/12/2012  FL2 transmitted to all facilities in geographic area requested by pt/family on  02/12/2012 FL2 transmitted to all facilities within larger geographic area on   Patient informed that his/her managed care company has contracts with or will negotiate with  certain facilities, including the following:     Patient/family informed of bed offers received:  02/13/2012 Patient chooses bed at [facility] Physician recommends and patient chooses bed at    Patient to be transferred to [facility] on  02/14/2012 Patient to be transferred to facility by [facility]  The following physician request were entered in Epic:   Additional Comments:  [name] LCSW [phone]

## 2012-02-28 DIAGNOSIS — M1909 Primary osteoarthritis, other specified site: Secondary | ICD-10-CM | POA: Diagnosis not present

## 2012-02-28 DIAGNOSIS — M2559 Pain in other specified joint: Secondary | ICD-10-CM | POA: Diagnosis not present

## 2012-03-04 DIAGNOSIS — M1909 Primary osteoarthritis, other specified site: Secondary | ICD-10-CM | POA: Diagnosis not present

## 2012-03-04 DIAGNOSIS — M2559 Pain in other specified joint: Secondary | ICD-10-CM | POA: Diagnosis not present

## 2012-03-11 DIAGNOSIS — M2559 Pain in other specified joint: Secondary | ICD-10-CM | POA: Diagnosis not present

## 2012-03-11 DIAGNOSIS — M1909 Primary osteoarthritis, other specified site: Secondary | ICD-10-CM | POA: Diagnosis not present

## 2012-03-12 DIAGNOSIS — H18419 Arcus senilis, unspecified eye: Secondary | ICD-10-CM | POA: Diagnosis not present

## 2012-03-12 DIAGNOSIS — H11159 Pinguecula, unspecified eye: Secondary | ICD-10-CM | POA: Diagnosis not present

## 2012-03-12 DIAGNOSIS — H43399 Other vitreous opacities, unspecified eye: Secondary | ICD-10-CM | POA: Diagnosis not present

## 2012-03-12 DIAGNOSIS — H2510 Age-related nuclear cataract, unspecified eye: Secondary | ICD-10-CM | POA: Diagnosis not present

## 2012-03-13 DIAGNOSIS — M1909 Primary osteoarthritis, other specified site: Secondary | ICD-10-CM | POA: Diagnosis not present

## 2012-03-13 DIAGNOSIS — M2559 Pain in other specified joint: Secondary | ICD-10-CM | POA: Diagnosis not present

## 2012-03-15 DIAGNOSIS — M2559 Pain in other specified joint: Secondary | ICD-10-CM | POA: Diagnosis not present

## 2012-03-15 DIAGNOSIS — M1909 Primary osteoarthritis, other specified site: Secondary | ICD-10-CM | POA: Diagnosis not present

## 2012-03-18 ENCOUNTER — Ambulatory Visit (INDEPENDENT_AMBULATORY_CARE_PROVIDER_SITE_OTHER): Payer: Medicare Other

## 2012-03-18 DIAGNOSIS — M1909 Primary osteoarthritis, other specified site: Secondary | ICD-10-CM | POA: Diagnosis not present

## 2012-03-18 DIAGNOSIS — J309 Allergic rhinitis, unspecified: Secondary | ICD-10-CM | POA: Diagnosis not present

## 2012-03-18 DIAGNOSIS — M2559 Pain in other specified joint: Secondary | ICD-10-CM | POA: Diagnosis not present

## 2012-03-21 DIAGNOSIS — M1909 Primary osteoarthritis, other specified site: Secondary | ICD-10-CM | POA: Diagnosis not present

## 2012-03-21 DIAGNOSIS — M2559 Pain in other specified joint: Secondary | ICD-10-CM | POA: Diagnosis not present

## 2012-03-22 DIAGNOSIS — M2559 Pain in other specified joint: Secondary | ICD-10-CM | POA: Diagnosis not present

## 2012-03-22 DIAGNOSIS — M1909 Primary osteoarthritis, other specified site: Secondary | ICD-10-CM | POA: Diagnosis not present

## 2012-03-25 DIAGNOSIS — M1909 Primary osteoarthritis, other specified site: Secondary | ICD-10-CM | POA: Diagnosis not present

## 2012-03-25 DIAGNOSIS — M2559 Pain in other specified joint: Secondary | ICD-10-CM | POA: Diagnosis not present

## 2012-03-27 DIAGNOSIS — M1909 Primary osteoarthritis, other specified site: Secondary | ICD-10-CM | POA: Diagnosis not present

## 2012-03-27 DIAGNOSIS — M2559 Pain in other specified joint: Secondary | ICD-10-CM | POA: Diagnosis not present

## 2012-03-28 DIAGNOSIS — Z96659 Presence of unspecified artificial knee joint: Secondary | ICD-10-CM | POA: Diagnosis not present

## 2012-04-01 ENCOUNTER — Other Ambulatory Visit: Payer: Self-pay

## 2012-04-01 DIAGNOSIS — Z1231 Encounter for screening mammogram for malignant neoplasm of breast: Secondary | ICD-10-CM

## 2012-04-01 DIAGNOSIS — M47817 Spondylosis without myelopathy or radiculopathy, lumbosacral region: Secondary | ICD-10-CM | POA: Diagnosis not present

## 2012-04-02 DIAGNOSIS — M2559 Pain in other specified joint: Secondary | ICD-10-CM | POA: Diagnosis not present

## 2012-04-05 DIAGNOSIS — M2559 Pain in other specified joint: Secondary | ICD-10-CM | POA: Diagnosis not present

## 2012-04-07 DIAGNOSIS — M2559 Pain in other specified joint: Secondary | ICD-10-CM | POA: Diagnosis not present

## 2012-04-08 DIAGNOSIS — M2559 Pain in other specified joint: Secondary | ICD-10-CM | POA: Diagnosis not present

## 2012-04-10 DIAGNOSIS — M2559 Pain in other specified joint: Secondary | ICD-10-CM | POA: Diagnosis not present

## 2012-04-11 ENCOUNTER — Other Ambulatory Visit: Payer: Self-pay | Admitting: Family Medicine

## 2012-04-11 NOTE — Telephone Encounter (Signed)
Rx request to pharmacy/[facility]  

## 2012-04-15 DIAGNOSIS — M2559 Pain in other specified joint: Secondary | ICD-10-CM | POA: Diagnosis not present

## 2012-04-16 ENCOUNTER — Ambulatory Visit (INDEPENDENT_AMBULATORY_CARE_PROVIDER_SITE_OTHER): Payer: Medicare Other | Admitting: Family Medicine

## 2012-04-16 ENCOUNTER — Encounter: Payer: Self-pay | Admitting: Family Medicine

## 2012-04-16 VITALS — BP 146/94 | HR 70 | Temp 97.3°F | Ht 59.0 in | Wt 199.8 lb

## 2012-04-16 DIAGNOSIS — D509 Iron deficiency anemia, unspecified: Secondary | ICD-10-CM

## 2012-04-16 DIAGNOSIS — M25561 Pain in right knee: Secondary | ICD-10-CM

## 2012-04-16 DIAGNOSIS — I10 Essential (primary) hypertension: Secondary | ICD-10-CM

## 2012-04-16 DIAGNOSIS — M25569 Pain in unspecified knee: Secondary | ICD-10-CM

## 2012-04-16 DIAGNOSIS — E119 Type 2 diabetes mellitus without complications: Secondary | ICD-10-CM | POA: Diagnosis not present

## 2012-04-16 DIAGNOSIS — R5383 Other fatigue: Secondary | ICD-10-CM

## 2012-04-16 DIAGNOSIS — D649 Anemia, unspecified: Secondary | ICD-10-CM

## 2012-04-16 DIAGNOSIS — R5381 Other malaise: Secondary | ICD-10-CM

## 2012-04-16 DIAGNOSIS — G4733 Obstructive sleep apnea (adult) (pediatric): Secondary | ICD-10-CM

## 2012-04-16 LAB — CBC
Hemoglobin: 12.8 g/dL (ref 12.0–15.0)
MCHC: 33.4 g/dL (ref 30.0–36.0)
MCV: 91.4 fl (ref 78.0–100.0)
Platelets: 273 10*3/uL (ref 150.0–400.0)
RBC: 4.19 Mil/uL (ref 3.87–5.11)

## 2012-04-16 LAB — HEMOGLOBIN A1C: Hgb A1c MFr Bld: 5.4 % (ref 4.6–6.5)

## 2012-04-16 LAB — RENAL FUNCTION PANEL
Albumin: 4 g/dL (ref 3.5–5.2)
CO2: 30 mEq/L (ref 19–32)
Calcium: 9.3 mg/dL (ref 8.4–10.5)
Glucose, Bld: 76 mg/dL (ref 70–99)
Potassium: 4 mEq/L (ref 3.5–5.1)

## 2012-04-16 NOTE — Assessment & Plan Note (Signed)
Given rx for new mask today

## 2012-04-16 NOTE — Progress Notes (Signed)
Patient ID: [name], female   DOB: [birth year], 67 y.o.   MRN: [id number] [name] [id number] Jul 01, 1945 04/16/2012      Progress Note-Follow Up  Subjective  Chief Complaint  Chief Complaint  Patient presents with  . wants iron level checked and CPAP mask ordered    HPI  Patient is a 67 we'll Caucasian female who is in today complaining of fatigue worsening cold intolerance. She is afraid that her anemia is worsening and she's had similar symptoms in the past. She also needs a prescription for new CPAP mask. Is having ongoing trouble to her right hip as she recovers from her right knee replacement. Describes a sciatica type pain with posterior pain and radicular symptoms down the right leg. No numbness or tingling into the foot. No falls or other illness. No chest pain, palpitations, fevers, GI or GU concerns are noted today.  Past Medical History  Diagnosis Date  . Obesity   . Esophageal reflux   . Fibromyalgia   . Asthma   . Allergy     rhinitis  . Sleep apnea, obstructive   . Bronchitis, acute 10/31/2010  . Otitis media of both ears 11/16/2010  . Depression with anxiety 01/08/2011  . Back pain 01/08/2011  . Sinusitis 06/11/2011  . Fatigue 06/11/2011  . Knee pain, right 09/20/2011  . Otitis media 09/20/2011  . Varicose veins of lower limb 10/02/2011  . Otitis media of left ear 09/19/2010  . Motion sickness 12/24/2011  . Sinusitis 12/24/2011  . Nausea & vomiting 12/24/2011  . Heart murmur   . Panic attacks   . Arthritis 02-01-12    osteoarthritis, Back pain, spine surgeries ? retain hardware cervial and  lumbar.  . Anemia 02-01-12    iron absorption problem  . Cancer 02-01-12    squamous cell -face    Past Surgical History  Procedure Laterality Date  . Bariatric surgery    . Lumbar disc surgery    . Bladder suspension      complicated by bowel nick/ clolostomy  . Gallbladder surgery  1978  . Appendectomy  1962  . Gastric bypass  2003    Patient also noted  "gastric bypass resection - 2004"  . Cervical disc surgery      C-7 in 2004, C-4 and C-5 in 2010  . Colostomy bag      Confirm with patient. Listed under medical conditions on form dated 07/18/09.  [name]      Per medical history form dated 07/18/09.  [name] Hernia repair  2009    Two hernias and abdominal reconstruction  . Ivc filter      prophyllactically- no hx DVT  . Carpal tunnel release  02-01-12    right  . Colostomy reversal  02-01-12  . Abdominal surgery  02-01-12    7'08-" major abdominal surgery for correction of pannilectomy" -[facility]  . Vulva surgery  02-01-12    cyst removal-pt 8 months pregnant  . Total knee arthroplasty  02/11/2012    Procedure: TOTAL KNEE ARTHROPLASTY;  Surgeon: [name], MD;  Location: [facility];  Service: Orthopedics;  Laterality: Right;    Family History  Problem Relation Age of Onset  . Alcohol abuse Mother   . Other Mother     accidental med overdose  . Emphysema Mother     smoked  . Bipolar disorder Mother   . Other Father     CHF  . Diabetes Father   .  Cancer Father     throat ca/ smoked  . Alcohol abuse Father   . Stroke Father   . Hypertension Brother   . Hyperlipidemia Brother   . Obesity Brother   . Heart attack Maternal Grandfather   . Heart attack Paternal Grandmother   . Heart attack Paternal Grandfather   . Arthritis Son     psoriatic  . Arthritis Son     psoriatic  . Obesity Son     History   Social History  . Marital Status: Married    Spouse Name: N/A    Number of Children: N/A  . Years of Education: N/A   Occupational History  . Not on file.   Social History Main Topics  . Smoking status: Never Smoker   . Smokeless tobacco: Never Used  . Alcohol Use: No     Comment: special occasions  . Drug Use: No  . Sexually Active: Yes   Other Topics Concern  . Not on file   Social History Narrative  . No narrative on file    Current Outpatient Prescriptions on File Prior to Visit   Medication Sig Dispense Refill  . ALPRAZolam (XANAX) 0.25 MG tablet Take 0.25 mg by mouth daily as needed. For anxiety      . bifidobacterium infantis (ALIGN) capsule Take 1 capsule by mouth daily.       . calcitRIOL (ROCALTROL) 0.5 MCG capsule Take 0.5 mcg by mouth daily.       . clonazePAM (KLONOPIN) 1 MG tablet Take 1 mg by mouth at bedtime.       . DULoxetine (CYMBALTA) 60 MG capsule Take 120 mg by mouth every morning.      . Fluticasone-Salmeterol (ADVAIR DISKUS) 250-50 MCG/DOSE AEPB inhale 1 puff by mouth twice a day  180 each  1  . ipratropium (ATROVENT) 0.06 % nasal spray Place 2 sprays into the nose 2 (two) times daily.       [name] lamoTRIgine (LAMICTAL) 100 MG tablet Take 100 mg by mouth at bedtime.       [name] levocetirizine (XYZAL) 5 MG tablet Take 5 mg by mouth every morning.       . montelukast (SINGULAIR) 10 MG tablet Take 10 mg by mouth every morning.      . NON FORMULARY once a week.       . NON FORMULARY CPAP- set on [employer].      . Olopatadine HCl (PATANASE) 0.6 % SOLN Place 2 sprays into the nose every morning.       . pantoprazole (PROTONIX) 40 MG tablet Take 40 mg by mouth at bedtime.      . ranitidine (ZANTAC) 150 MG tablet Take 150 mg by mouth every morning.      . solifenacin (VESICARE) 10 MG tablet Take 10 mg by mouth every morning.      . traZODone (DESYREL) 50 MG tablet Take 50 mg by mouth at bedtime.       . VESICARE 10 MG tablet take 1 tablet by mouth once daily  30 tablet  1  . methocarbamol (ROBAXIN) 500 MG tablet Take 1 tablet (500 mg total) by mouth every 6 (six) hours as needed (muscle spasms).  50 tablet  0  . methylphenidate (RITALIN) 10 MG tablet Take 10 mg by mouth 2 (two) times daily.       No current facility-administered medications on file prior to visit.    Allergies  Allergen Reactions  . Hydrocodone  Ask patient severity and reaction. Not indicated on medical history dated 07/18/09.02-01-12 nausea and vomiting and headaches.  . Cephalexin      Ask patient severity and reaction. Not indicated on medical history dated 07/18/09.? Was this or Levaquin that caused Neuropathy  . Codeine     Crazy in the head, bp drops  . Levofloxacin Itching and Other (See Comments)    Neuropathy   . Meperidine Hcl     B/P drops  . Morphine And Related     Ask patient severity and reaction. Not indicated on medical history dated 07/18/09. 02-01-12 States" extreme migraines"  . Oxycodone-Acetaminophen Other (See Comments)    Crazy in head, drop in bp  . Penicillins Nausea And Vomiting  . Sulfonamide Derivatives Nausea And Vomiting    Stomach upset  . Vicodin (Hydrocodone-Acetaminophen)     Ask patient severity and reaction. Not indicated on medical history dated 07/18/09.02-01-12 same as Hydrocodone  . Adhesive (Tape) Rash    Latex in adhesive tape. Please use paper tape    Review of Systems  Review of Systems  Constitutional: Positive for chills and malaise/fatigue. Negative for fever.  HENT: Negative for congestion.   Eyes: Negative for discharge.  Respiratory: Negative for shortness of breath.   Cardiovascular: Negative for chest pain, palpitations and leg swelling.  Gastrointestinal: Negative for nausea, abdominal pain and diarrhea.  Genitourinary: Negative for dysuria.  Musculoskeletal: Positive for back pain and joint pain. Negative for falls.  Skin: Negative for rash.  Neurological: Negative for loss of consciousness and headaches.  Endo/Heme/Allergies: Negative for polydipsia.  Psychiatric/Behavioral: Negative for depression and suicidal ideas. The patient is nervous/anxious. The patient does not have insomnia.     Objective  BP 146/94  Pulse 70  Temp(Src) 97.3 F (36.3 C) (Temporal)  Ht 4\' 11"  (1.499 m)  Wt 199 lb 12.8 oz (90.629 kg)  BMI 40.33 kg/m2  SpO2 98%  Physical Exam  Physical Exam  Constitutional: She is oriented to person, place, and time and well-developed, well-nourished, and in no distress. No distress.  HENT:   Head: Normocephalic and atraumatic.  Eyes: Conjunctivae are normal.  Neck: Neck supple. No thyromegaly present.  Cardiovascular: Normal rate, regular rhythm and normal heart sounds.   Pulmonary/Chest: Effort normal and breath sounds normal. She has no wheezes.  Abdominal: She exhibits no distension and no mass.  Musculoskeletal: She exhibits no edema.  Lymphadenopathy:    She has no cervical adenopathy.  Neurological: She is alert and oriented to person, place, and time.  Skin: Skin is warm and dry. No rash noted. She is not diaphoretic.  Psychiatric: Memory, affect and judgment normal.    Lab Results  Component Value Date   TSH 2.60 06/11/2011   Lab Results  Component Value Date   WBC 10.1 02/13/2012   HGB 10.5* 02/13/2012   HCT 32.4* 02/13/2012   MCV 94.2 02/13/2012   PLT 206 02/13/2012   Lab Results  Component Value Date   CREATININE 0.77 02/13/2012   BUN 18 02/13/2012   NA 134* 02/13/2012   K 3.6 02/13/2012   CL 98 02/13/2012   CO2 24 02/13/2012   Lab Results  Component Value Date   ALT 12 12/07/2011   AST 15 12/07/2011   ALKPHOS 95 12/07/2011   BILITOT 0.2* 12/07/2011     Assessment & Plan  ESSENTIAL HYPERTENSION, BENIGN Mild elevation with pain, will monitor  SLEEP APNEA, OBSTRUCTIVE Given rx for new mask today  Fatigue Cold intolerance, check tsh,  cbc, ferritin  Knee pain, right Right hip and knee pain are flared presently, has had an injection with her orthopaedist which was helpful temporarily. Encouraged use of Robaxin and naproxen.   ANEMIA, IRON DEFICIENCY C/o excessive cold and fatigue but labs are unremarkable on check today

## 2012-04-16 NOTE — Assessment & Plan Note (Signed)
Cold intolerance, check tsh, cbc, ferritin

## 2012-04-16 NOTE — Assessment & Plan Note (Signed)
Right hip and knee pain are flared presently, has had an injection with her orthopaedist which was helpful temporarily. Encouraged use of Robaxin and naproxen.

## 2012-04-16 NOTE — Assessment & Plan Note (Signed)
Mild elevation with pain, will monitor 

## 2012-04-17 DIAGNOSIS — M2559 Pain in other specified joint: Secondary | ICD-10-CM | POA: Diagnosis not present

## 2012-04-17 LAB — TSH: TSH: 1.48 u[IU]/mL (ref 0.35–5.50)

## 2012-04-17 NOTE — Progress Notes (Signed)
Quick Note:  Patient Informed and voiced understanding ______ 

## 2012-04-18 DIAGNOSIS — M2559 Pain in other specified joint: Secondary | ICD-10-CM | POA: Diagnosis not present

## 2012-04-20 NOTE — Assessment & Plan Note (Signed)
C/o excessive cold and fatigue but labs are unremarkable on check today

## 2012-04-22 DIAGNOSIS — M2559 Pain in other specified joint: Secondary | ICD-10-CM | POA: Diagnosis not present

## 2012-04-22 DIAGNOSIS — M1909 Primary osteoarthritis, other specified site: Secondary | ICD-10-CM | POA: Diagnosis not present

## 2012-04-24 DIAGNOSIS — M2559 Pain in other specified joint: Secondary | ICD-10-CM | POA: Diagnosis not present

## 2012-04-29 DIAGNOSIS — M2559 Pain in other specified joint: Secondary | ICD-10-CM | POA: Diagnosis not present

## 2012-05-01 ENCOUNTER — Ambulatory Visit
Admission: RE | Admit: 2012-05-01 | Discharge: 2012-05-01 | Disposition: A | Discharge status: Home / Self Care | Payer: Medicare Other | Source: Ambulatory Visit

## 2012-05-01 DIAGNOSIS — Z1231 Encounter for screening mammogram for malignant neoplasm of breast: Secondary | ICD-10-CM

## 2012-05-02 DIAGNOSIS — M2559 Pain in other specified joint: Secondary | ICD-10-CM | POA: Diagnosis not present

## 2012-05-05 ENCOUNTER — Telehealth: Payer: Self-pay

## 2012-05-05 ENCOUNTER — Other Ambulatory Visit: Payer: Self-pay | Admitting: Family Medicine

## 2012-05-05 NOTE — Telephone Encounter (Signed)
I spoke to pt this morning about where the CPAP testing was done. Pt states it was done at [facility] over 20 yrs ago. Pt states she has the paperwork somewhere and will call back this week and let us know if she found it.

## 2012-05-06 DIAGNOSIS — M2559 Pain in other specified joint: Secondary | ICD-10-CM | POA: Diagnosis not present

## 2012-05-09 DIAGNOSIS — M2559 Pain in other specified joint: Secondary | ICD-10-CM | POA: Diagnosis not present

## 2012-05-13 DIAGNOSIS — M2559 Pain in other specified joint: Secondary | ICD-10-CM | POA: Diagnosis not present

## 2012-05-13 DIAGNOSIS — E559 Vitamin D deficiency, unspecified: Secondary | ICD-10-CM | POA: Diagnosis not present

## 2012-05-13 DIAGNOSIS — D649 Anemia, unspecified: Secondary | ICD-10-CM | POA: Diagnosis not present

## 2012-05-13 DIAGNOSIS — E211 Secondary hyperparathyroidism, not elsewhere classified: Secondary | ICD-10-CM | POA: Diagnosis not present

## 2012-05-15 DIAGNOSIS — M2559 Pain in other specified joint: Secondary | ICD-10-CM | POA: Diagnosis not present

## 2012-05-19 DIAGNOSIS — M961 Postlaminectomy syndrome, not elsewhere classified: Secondary | ICD-10-CM | POA: Diagnosis not present

## 2012-05-19 DIAGNOSIS — M5430 Sciatica, unspecified side: Secondary | ICD-10-CM | POA: Diagnosis not present

## 2012-05-20 ENCOUNTER — Ambulatory Visit: Payer: Medicare Other | Admitting: Family Medicine

## 2012-05-20 DIAGNOSIS — M899 Disorder of bone, unspecified: Secondary | ICD-10-CM | POA: Diagnosis not present

## 2012-05-20 DIAGNOSIS — M949 Disorder of cartilage, unspecified: Secondary | ICD-10-CM | POA: Diagnosis not present

## 2012-05-20 DIAGNOSIS — E211 Secondary hyperparathyroidism, not elsewhere classified: Secondary | ICD-10-CM | POA: Diagnosis not present

## 2012-05-20 DIAGNOSIS — D649 Anemia, unspecified: Secondary | ICD-10-CM | POA: Diagnosis not present

## 2012-05-20 DIAGNOSIS — M2559 Pain in other specified joint: Secondary | ICD-10-CM | POA: Diagnosis not present

## 2012-05-20 DIAGNOSIS — E559 Vitamin D deficiency, unspecified: Secondary | ICD-10-CM | POA: Diagnosis not present

## 2012-05-21 ENCOUNTER — Telehealth: Payer: Self-pay | Admitting: Internal Medicine

## 2012-05-21 ENCOUNTER — Other Ambulatory Visit: Payer: Self-pay | Admitting: Endocrinology

## 2012-05-21 DIAGNOSIS — M8580 Other specified disorders of bone density and structure, unspecified site: Secondary | ICD-10-CM

## 2012-05-21 MED ORDER — MONTELUKAST SODIUM 10 MG PO TABS: 10.0000 mg | ORAL_TABLET | Freq: Every day | ORAL | Status: DC

## 2012-05-21 NOTE — Telephone Encounter (Signed)
I spoke with pt. She stated she has changed over to [facility] and they do not have a copy of her sleep study. PCC's isn't this faxed with order's? Per pt medicare will not give her a new mask until they get a copy of this and per pt she had this in 2000. I advised pt will also send refill on singulair.

## 2012-05-21 NOTE — Telephone Encounter (Signed)
Spoke to [email or web address]  who informed me she has all the records she needs fron [facility] and pt does not have to repeat anything lmom telling pt this Tobe Sos

## 2012-05-21 NOTE — Telephone Encounter (Signed)
Patient also needing singulair refill--[facility]

## 2012-05-23 DIAGNOSIS — M2559 Pain in other specified joint: Secondary | ICD-10-CM | POA: Diagnosis not present

## 2012-05-27 DIAGNOSIS — M2559 Pain in other specified joint: Secondary | ICD-10-CM | POA: Diagnosis not present

## 2012-05-28 ENCOUNTER — Other Ambulatory Visit: Payer: Medicare Other

## 2012-05-28 DIAGNOSIS — Z96659 Presence of unspecified artificial knee joint: Secondary | ICD-10-CM | POA: Diagnosis not present

## 2012-05-29 ENCOUNTER — Ambulatory Visit (INDEPENDENT_AMBULATORY_CARE_PROVIDER_SITE_OTHER): Payer: Medicare Other | Admitting: Family Medicine

## 2012-05-29 ENCOUNTER — Encounter: Payer: Self-pay | Admitting: Family Medicine

## 2012-05-29 VITALS — BP 140/76 | HR 70 | Temp 98.8°F | Ht 59.0 in | Wt 200.1 lb

## 2012-05-29 DIAGNOSIS — M2559 Pain in other specified joint: Secondary | ICD-10-CM | POA: Diagnosis not present

## 2012-05-29 DIAGNOSIS — I10 Essential (primary) hypertension: Secondary | ICD-10-CM | POA: Diagnosis not present

## 2012-05-29 DIAGNOSIS — K219 Gastro-esophageal reflux disease without esophagitis: Secondary | ICD-10-CM | POA: Diagnosis not present

## 2012-05-29 DIAGNOSIS — M25569 Pain in unspecified knee: Secondary | ICD-10-CM | POA: Diagnosis not present

## 2012-05-29 DIAGNOSIS — M25561 Pain in right knee: Secondary | ICD-10-CM

## 2012-05-29 DIAGNOSIS — E669 Obesity, unspecified: Secondary | ICD-10-CM | POA: Diagnosis not present

## 2012-05-29 MED ORDER — PANTOPRAZOLE SODIUM 40 MG PO TBEC: 40.0000 mg | DELAYED_RELEASE_TABLET | Freq: Every day | ORAL | Status: DC

## 2012-05-29 NOTE — Progress Notes (Signed)
Patient ID: [name], female   DOB: August 14, 1945, 67 y.o.   MRN: [id number] [name] [id number] 1945/11/08 05/29/2012      Progress Note-Follow Up  Subjective  Chief Complaint  Chief Complaint  Patient presents with  . Follow-up    6 week    HPI  Patient is a 67 year old Caucasian female in today for followup. She continues to struggle with knee pain and stiffness status post her total knee replacement. Is about to start a course of physical therapy. No other acute complaints. No chest pain, palpitations, shortness, GI or GU complaints she has appointment with her gynecologist in for her annual exam. Also has an appointment with her cardiologist Dr. Hilario Quarry.   Past Medical History  Diagnosis Date  . Obesity   . Esophageal reflux   . Fibromyalgia   . Asthma   . Allergy     rhinitis  . Sleep apnea, obstructive   . Bronchitis, acute 10/31/2010  . Otitis media of both ears 11/16/2010  . Depression with anxiety 01/08/2011  . Back pain 01/08/2011  . Sinusitis 06/11/2011  . Fatigue 06/11/2011  . Knee pain, right 09/20/2011  . Otitis media 09/20/2011  . Varicose veins of lower limb 10/02/2011  . Otitis media of left ear 09/19/2010  . Motion sickness 12/24/2011  . Sinusitis 12/24/2011  . Nausea & vomiting 12/24/2011  . Heart murmur   . Panic attacks   . Arthritis 02-01-12    osteoarthritis, Back pain, spine surgeries ? retain hardware cervial and  lumbar.  . Anemia 02-01-12    iron absorption problem  . Cancer 02-01-12    squamous cell -face    Past Surgical History  Procedure Laterality Date  . Bariatric surgery    . Lumbar disc surgery    . Bladder suspension      complicated by bowel nick/ clolostomy  . Gallbladder surgery  1978  . Appendectomy  1962  . Gastric bypass  2003    Patient also noted "gastric bypass resection - 2004"  . Cervical disc surgery      C-7 in 2004, C-4 and C-5 in 2010  . Colostomy bag      Confirm with patient. Listed under medical conditions on  form dated 07/18/09.  [name]      Per medical history form dated 07/18/09.  [name] Hernia repair  2009    Two hernias and abdominal reconstruction  . Ivc filter      prophyllactically- no hx DVT  . Carpal tunnel release  02-01-12    right  . Colostomy reversal  02-01-12  . Abdominal surgery  02-01-12    7'08-" major abdominal surgery for correction of pannilectomy" -[facility]  . Vulva surgery  02-01-12    cyst removal-pt 8 months pregnant  . Total knee arthroplasty  02/11/2012    Procedure: TOTAL KNEE ARTHROPLASTY;  Surgeon: [name], MD;  Location: [facility];  Service: Orthopedics;  Laterality: Right;    Family History  Problem Relation Age of Onset  . Alcohol abuse Mother   . Other Mother     accidental med overdose  . Emphysema Mother     smoked  . Bipolar disorder Mother   . Other Father     CHF  . Diabetes Father   . Cancer Father     throat ca/ smoked  . Alcohol abuse Father   . Stroke Father   . Hypertension Brother   . Hyperlipidemia Brother   .  Obesity Brother   . Heart attack Maternal Grandfather   . Heart attack Paternal Grandmother   . Heart attack Paternal Grandfather   . Arthritis Son     psoriatic  . Arthritis Son     psoriatic  . Obesity Son     History   Social History  . Marital Status: Married    Spouse Name: N/A    Number of Children: N/A  . Years of Education: N/A   Occupational History  . Not on file.   Social History Main Topics  . Smoking status: Never Smoker   . Smokeless tobacco: Never Used  . Alcohol Use: No     Comment: special occasions  . Drug Use: No  . Sexually Active: Yes   Other Topics Concern  . Not on file   Social History Narrative  . No narrative on file    Current Outpatient Prescriptions on File Prior to Visit  Medication Sig Dispense Refill  . ALPRAZolam (XANAX) 0.25 MG tablet Take 0.25 mg by mouth daily as needed. For anxiety      . bifidobacterium infantis (ALIGN) capsule Take 1 capsule  by mouth daily.       . calcitRIOL (ROCALTROL) 0.5 MCG capsule Take 0.5 mcg by mouth daily.       . clonazePAM (KLONOPIN) 1 MG tablet Take 1 mg by mouth at bedtime.       . DULoxetine (CYMBALTA) 60 MG capsule Take 120 mg by mouth every morning.      . Fluticasone-Salmeterol (ADVAIR DISKUS) 250-50 MCG/DOSE AEPB inhale 1 puff by mouth twice a day  180 each  1  . ipratropium (ATROVENT) 0.06 % nasal spray Place 2 sprays into the nose 2 (two) times daily.       [name] lamoTRIgine (LAMICTAL) 100 MG tablet Take 100 mg by mouth at bedtime.       [name] levocetirizine (XYZAL) 5 MG tablet Take 5 mg by mouth every morning.       . methocarbamol (ROBAXIN) 500 MG tablet Take 1 tablet (500 mg total) by mouth every 6 (six) hours as needed (muscle spasms).  50 tablet  0  . methylphenidate (RITALIN) 10 MG tablet Take 10 mg by mouth 2 (two) times daily.      . montelukast (SINGULAIR) 10 MG tablet Take 1 tablet (10 mg total) by mouth daily.  30 tablet  3  . NON FORMULARY once a week.       . NON FORMULARY CPAP- set on [employer].      . Olopatadine HCl (PATANASE) 0.6 % SOLN Place 2 sprays into the nose every morning.       . pantoprazole (PROTONIX) 40 MG tablet Take 40 mg by mouth at bedtime.      . ranitidine (ZANTAC) 150 MG tablet take 1 tablet by mouth twice a day ALTERNATE WITH PANTOPRAZOLE  60 tablet  3  . traZODone (DESYREL) 50 MG tablet Take 50 mg by mouth at bedtime.       . VESICARE 10 MG tablet take 1 tablet by mouth once daily  30 tablet  1   No current facility-administered medications on file prior to visit.    Allergies  Allergen Reactions  . Hydrocodone     Ask patient severity and reaction. Not indicated on medical history dated 07/18/09.02-01-12 nausea and vomiting and headaches.  . Cephalexin     Ask patient severity and reaction. Not indicated on medical history dated 07/18/09.? Was this or Levaquin  that caused Neuropathy  . Codeine     Crazy in the head, bp drops  . Levofloxacin Itching and Other  (See Comments)    Neuropathy   . Meperidine Hcl     B/P drops  . Morphine And Related     Ask patient severity and reaction. Not indicated on medical history dated 07/18/09. 02-01-12 States" extreme migraines"  . Oxycodone-Acetaminophen Other (See Comments)    Crazy in head, drop in bp  . Penicillins Nausea And Vomiting  . Sulfonamide Derivatives Nausea And Vomiting    Stomach upset  . Vicodin (Hydrocodone-Acetaminophen)     Ask patient severity and reaction. Not indicated on medical history dated 07/18/09.02-01-12 same as Hydrocodone  . Adhesive (Tape) Rash    Latex in adhesive tape. Please use paper tape    Review of Systems  Review of Systems  Constitutional: Positive for malaise/fatigue. Negative for fever.  HENT: Negative for congestion.   Eyes: Negative for discharge.  Respiratory: Negative for shortness of breath.   Cardiovascular: Negative for chest pain, palpitations and leg swelling.  Gastrointestinal: Negative for nausea, abdominal pain and diarrhea.  Genitourinary: Negative for dysuria.  Musculoskeletal: Positive for myalgias, back pain and joint pain. Negative for falls.  Skin: Negative for rash.  Neurological: Negative for loss of consciousness and headaches.  Endo/Heme/Allergies: Negative for polydipsia.  Psychiatric/Behavioral: Negative for depression and suicidal ideas. The patient is not nervous/anxious and does not have insomnia.     Objective  BP 140/76  Pulse 70  Temp(Src) 98.8 F (37.1 C) (Oral)  Ht 4\' 11"  (1.499 m)  Wt 200 lb 1.3 oz (90.756 kg)  BMI 40.39 kg/m2  SpO2 97%  Physical Exam  Physical Exam  Constitutional: She is oriented to person, place, and time and well-developed, well-nourished, and in no distress. No distress.  HENT:  Head: Normocephalic and atraumatic.  Eyes: Conjunctivae are normal.  Neck: Neck supple. No thyromegaly present.  Cardiovascular: Normal rate and regular rhythm.   Murmur heard. Pulmonary/Chest: Effort normal and  breath sounds normal. She has no wheezes.  Abdominal: She exhibits no distension and no mass.  Musculoskeletal: She exhibits no edema.  Lymphadenopathy:    She has no cervical adenopathy.  Neurological: She is alert and oriented to person, place, and time.  Skin: Skin is warm and dry. No rash noted. She is not diaphoretic.  Psychiatric: Memory, affect and judgment normal.    Lab Results  Component Value Date   TSH 1.48 04/16/2012   Lab Results  Component Value Date   WBC 6.0 04/16/2012   HGB 12.8 04/16/2012   HCT 38.3 04/16/2012   MCV 91.4 04/16/2012   PLT 273.0 04/16/2012   Lab Results  Component Value Date   CREATININE 0.7 04/16/2012   BUN 15 04/16/2012   NA 142 04/16/2012   K 4.0 04/16/2012   CL 102 04/16/2012   CO2 30 04/16/2012   Lab Results  Component Value Date   ALT 12 12/07/2011   AST 15 12/07/2011   ALKPHOS 95 12/07/2011   BILITOT 0.2* 12/07/2011    Assessment & Plan  ESSENTIAL HYPERTENSION, BENIGN Well controlled, no changes.   OBESITY Reinforced DASH diet.  Increase exercise as tolerated.  Knee pain, right Still struggling with decreased ROM and pain, is about to start a second course of PT

## 2012-05-29 NOTE — Patient Instructions (Signed)
Rel of rec Dr Talmage Nap last office note and last 2 sets of labs  Gastroesophageal Reflux Disease, Adult Gastroesophageal reflux disease (GERD) happens when acid from your stomach flows up into the esophagus. When acid comes in contact with the esophagus, the acid causes soreness (inflammation) in the esophagus. Over time, GERD may create small holes (ulcers) in the lining of the esophagus. CAUSES   Increased body weight. This puts pressure on the stomach, making acid rise from the stomach into the esophagus.  Smoking. This increases acid production in the stomach.  Drinking alcohol. This causes decreased pressure in the lower esophageal sphincter (valve or ring of muscle between the esophagus and stomach), allowing acid from the stomach into the esophagus.  Late evening meals and a full stomach. This increases pressure and acid production in the stomach.  A malformed lower esophageal sphincter. Sometimes, no cause is found. SYMPTOMS   Burning pain in the lower part of the mid-chest behind the breastbone and in the mid-stomach area. This may occur twice a week or more often.  Trouble swallowing.  Sore throat.  Dry cough.  Asthma-like symptoms including chest tightness, shortness of breath, or wheezing. DIAGNOSIS  Your caregiver may be able to diagnose GERD based on your symptoms. In some cases, X-rays and other tests may be done to check for complications or to check the condition of your stomach and esophagus. TREATMENT  Your caregiver may recommend over-the-counter or prescription medicines to help decrease acid production. Ask your caregiver before starting or adding any new medicines.  HOME CARE INSTRUCTIONS   Change the factors that you can control. Ask your caregiver for guidance concerning weight loss, quitting smoking, and alcohol consumption.  Avoid foods and drinks that make your symptoms worse, such as:  Caffeine or alcoholic drinks.  Chocolate.  Peppermint or mint  flavorings.  Garlic and onions.  Spicy foods.  Citrus fruits, such as oranges, lemons, or limes.  Tomato-based foods such as sauce, chili, salsa, and pizza.  Fried and fatty foods.  Avoid lying down for the 3 hours prior to your bedtime or prior to taking a nap.  Eat small, frequent meals instead of large meals.  Wear loose-fitting clothing. Do not wear anything tight around your waist that causes pressure on your stomach.  Raise the head of your bed 6 to 8 inches with wood blocks to help you sleep. Extra pillows will not help.  Only take over-the-counter or prescription medicines for pain, discomfort, or fever as directed by your caregiver.  Do not take aspirin, ibuprofen, or other nonsteroidal anti-inflammatory drugs (NSAIDs). SEEK IMMEDIATE MEDICAL CARE IF:   You have pain in your arms, neck, jaw, teeth, or back.  Your pain increases or changes in intensity or duration.  You develop nausea, vomiting, or sweating (diaphoresis).  You develop shortness of breath, or you faint.  Your vomit is green, yellow, black, or looks like coffee grounds or blood.  Your stool is red, bloody, or black. These symptoms could be signs of other problems, such as heart disease, gastric bleeding, or esophageal bleeding. MAKE SURE YOU:   Understand these instructions.  Will watch your condition.  Will get help right away if you are not doing well or get worse. Document Released: 10/18/2004 Document Revised: 04/02/2011 Document Reviewed: 07/28/2010 [facility] Patient Information 2013 [place], [place].

## 2012-05-30 ENCOUNTER — Encounter: Payer: Self-pay | Admitting: Obstetrics and Gynecology

## 2012-05-30 ENCOUNTER — Ambulatory Visit
Admission: RE | Admit: 2012-05-30 | Discharge: 2012-05-30 | Disposition: A | Discharge status: Home / Self Care | Payer: Medicare Other | Source: Ambulatory Visit | Attending: Endocrinology | Admitting: Endocrinology

## 2012-05-30 DIAGNOSIS — M949 Disorder of cartilage, unspecified: Secondary | ICD-10-CM | POA: Diagnosis not present

## 2012-05-30 DIAGNOSIS — M899 Disorder of bone, unspecified: Secondary | ICD-10-CM | POA: Diagnosis not present

## 2012-05-30 DIAGNOSIS — M8580 Other specified disorders of bone density and structure, unspecified site: Secondary | ICD-10-CM

## 2012-06-01 NOTE — Assessment & Plan Note (Signed)
Still struggling with decreased ROM and pain, is about to start a second course of PT

## 2012-06-01 NOTE — Assessment & Plan Note (Signed)
Well controlled, no changes 

## 2012-06-01 NOTE — Assessment & Plan Note (Signed)
Reinforced DASH diet.  Increase exercise as tolerated.

## 2012-06-02 DIAGNOSIS — Z1289 Encounter for screening for malignant neoplasm of other sites: Secondary | ICD-10-CM | POA: Diagnosis not present

## 2012-06-02 DIAGNOSIS — IMO0002 Reserved for concepts with insufficient information to code with codable children: Secondary | ICD-10-CM | POA: Diagnosis not present

## 2012-06-02 DIAGNOSIS — Z1272 Encounter for screening for malignant neoplasm of vagina: Secondary | ICD-10-CM | POA: Diagnosis not present

## 2012-06-02 DIAGNOSIS — N952 Postmenopausal atrophic vaginitis: Secondary | ICD-10-CM | POA: Diagnosis not present

## 2012-06-02 DIAGNOSIS — R87810 Cervical high risk human papillomavirus (HPV) DNA test positive: Secondary | ICD-10-CM | POA: Diagnosis not present

## 2012-06-02 DIAGNOSIS — Z01419 Encounter for gynecological examination (general) (routine) without abnormal findings: Secondary | ICD-10-CM | POA: Diagnosis not present

## 2012-06-02 DIAGNOSIS — Z124 Encounter for screening for malignant neoplasm of cervix: Secondary | ICD-10-CM | POA: Diagnosis not present

## 2012-06-03 DIAGNOSIS — M2559 Pain in other specified joint: Secondary | ICD-10-CM | POA: Diagnosis not present

## 2012-06-04 DIAGNOSIS — M47817 Spondylosis without myelopathy or radiculopathy, lumbosacral region: Secondary | ICD-10-CM | POA: Diagnosis not present

## 2012-06-06 ENCOUNTER — Telehealth: Payer: Self-pay | Admitting: Family Medicine

## 2012-06-06 NOTE — Telephone Encounter (Signed)
Received medical records from [facility]-Dr. Talmage Nap  P: [phone] F: [phone]

## 2012-06-12 ENCOUNTER — Telehealth: Payer: Self-pay | Admitting: Family Medicine

## 2012-06-12 NOTE — Telephone Encounter (Signed)
vesicare 10mg 

## 2012-06-14 DIAGNOSIS — M2559 Pain in other specified joint: Secondary | ICD-10-CM | POA: Diagnosis not present

## 2012-06-17 ENCOUNTER — Other Ambulatory Visit: Payer: Self-pay

## 2012-06-17 ENCOUNTER — Encounter: Payer: Self-pay | Admitting: Family Medicine

## 2012-06-17 MED ORDER — SOLIFENACIN SUCCINATE 10 MG PO TABS: 10.0000 mg | ORAL_TABLET | Freq: Every day | ORAL | Status: DC

## 2012-06-17 NOTE — Telephone Encounter (Signed)
Pt left a message stating she would like her Vesicare sent to [employer]

## 2012-06-26 NOTE — Telephone Encounter (Signed)
Pt states that [facility] called and told her that everything was good and they received all the numbers from the last sleep study. Pt is going to contact them to have them send the paperwork to [place]

## 2012-06-27 NOTE — Telephone Encounter (Signed)
Spoke to Lafe at [place] [phone]) she is faxing the information to [place]

## 2012-06-30 ENCOUNTER — Telehealth: Payer: Self-pay

## 2012-06-30 NOTE — Telephone Encounter (Signed)
FYI: I spoke to [name] at [place] per MD to see if [facility] does sleep studies do to it being more than 13 years since pt has done one. Per [name] doesn't do it but they have a company that does home sleep studies for them.  [name] is faxing a paper for MD to sign

## 2012-06-30 NOTE — Telephone Encounter (Signed)
perfect

## 2012-07-05 DIAGNOSIS — M2559 Pain in other specified joint: Secondary | ICD-10-CM | POA: Diagnosis not present

## 2012-07-09 DIAGNOSIS — M2559 Pain in other specified joint: Secondary | ICD-10-CM | POA: Diagnosis not present

## 2012-07-17 DIAGNOSIS — M2559 Pain in other specified joint: Secondary | ICD-10-CM | POA: Diagnosis not present

## 2012-07-23 DIAGNOSIS — M2559 Pain in other specified joint: Secondary | ICD-10-CM | POA: Diagnosis not present

## 2012-07-29 DIAGNOSIS — G4710 Hypersomnia, unspecified: Secondary | ICD-10-CM | POA: Diagnosis not present

## 2012-07-29 DIAGNOSIS — G4730 Sleep apnea, unspecified: Secondary | ICD-10-CM | POA: Diagnosis not present

## 2012-07-29 DIAGNOSIS — M2559 Pain in other specified joint: Secondary | ICD-10-CM | POA: Diagnosis not present

## 2012-07-30 DIAGNOSIS — Z471 Aftercare following joint replacement surgery: Secondary | ICD-10-CM | POA: Diagnosis not present

## 2012-07-30 DIAGNOSIS — M5430 Sciatica, unspecified side: Secondary | ICD-10-CM | POA: Diagnosis not present

## 2012-07-30 DIAGNOSIS — Z96659 Presence of unspecified artificial knee joint: Secondary | ICD-10-CM | POA: Diagnosis not present

## 2012-08-01 ENCOUNTER — Ambulatory Visit: Payer: BC Managed Care – PPO | Admitting: Internal Medicine

## 2012-08-05 DIAGNOSIS — M545 Low back pain, unspecified: Secondary | ICD-10-CM | POA: Diagnosis not present

## 2012-08-05 DIAGNOSIS — M79609 Pain in unspecified limb: Secondary | ICD-10-CM | POA: Diagnosis not present

## 2012-08-12 ENCOUNTER — Encounter: Payer: Self-pay | Admitting: Family Medicine

## 2012-08-15 ENCOUNTER — Telehealth: Payer: Self-pay

## 2012-08-15 NOTE — Telephone Encounter (Signed)
So unfortunately the older cheaper stuff has a lot more side effects, we can try ditropan if she wants to but can lead to light headed, low bp issues and dry mouth

## 2012-08-15 NOTE — Telephone Encounter (Signed)
Patient left a message stating that her vesicare is going to be $170/monthly ([facility] [place]). Pt would like to know if there is anything else that is similar that is generic? Please advise?

## 2012-08-18 MED ORDER — OXYBUTYNIN CHLORIDE 5 MG PO TABS: 5.0000 mg | ORAL_TABLET | Freq: Two times a day (BID) | ORAL | Status: DC

## 2012-08-18 NOTE — Telephone Encounter (Signed)
Pt states she will try Ditropan because she can't afford the other. Please advise dose?

## 2012-08-18 NOTE — Telephone Encounter (Signed)
Notify 5 mg po bid, disp #60, 1 rf

## 2012-08-18 NOTE — Telephone Encounter (Signed)
LMOVM to return call concerning vm patient left.

## 2012-08-19 NOTE — Telephone Encounter (Signed)
Patient notified that rx was sent in to pharmacy 

## 2012-08-21 DIAGNOSIS — M961 Postlaminectomy syndrome, not elsewhere classified: Secondary | ICD-10-CM | POA: Diagnosis not present

## 2012-08-29 ENCOUNTER — Ambulatory Visit: Payer: Medicare Other | Admitting: Family Medicine

## 2012-08-29 DIAGNOSIS — I8310 Varicose veins of unspecified lower extremity with inflammation: Secondary | ICD-10-CM | POA: Diagnosis not present

## 2012-08-29 DIAGNOSIS — M79609 Pain in unspecified limb: Secondary | ICD-10-CM | POA: Diagnosis not present

## 2012-09-01 DIAGNOSIS — M79609 Pain in unspecified limb: Secondary | ICD-10-CM | POA: Diagnosis not present

## 2012-09-01 DIAGNOSIS — I8310 Varicose veins of unspecified lower extremity with inflammation: Secondary | ICD-10-CM | POA: Diagnosis not present

## 2012-09-09 DIAGNOSIS — I8310 Varicose veins of unspecified lower extremity with inflammation: Secondary | ICD-10-CM | POA: Diagnosis not present

## 2012-09-10 DIAGNOSIS — I4510 Unspecified right bundle-branch block: Secondary | ICD-10-CM | POA: Diagnosis not present

## 2012-09-10 DIAGNOSIS — R0602 Shortness of breath: Secondary | ICD-10-CM | POA: Diagnosis not present

## 2012-09-10 DIAGNOSIS — R0789 Other chest pain: Secondary | ICD-10-CM | POA: Diagnosis not present

## 2012-09-10 DIAGNOSIS — R0989 Other specified symptoms and signs involving the circulatory and respiratory systems: Secondary | ICD-10-CM | POA: Diagnosis not present

## 2012-09-23 ENCOUNTER — Ambulatory Visit (INDEPENDENT_AMBULATORY_CARE_PROVIDER_SITE_OTHER): Payer: Medicare Other | Admitting: Family Medicine

## 2012-09-23 ENCOUNTER — Encounter: Payer: Self-pay | Admitting: Family Medicine

## 2012-09-23 VITALS — BP 120/80 | HR 59 | Temp 98.5°F | Ht 59.0 in | Wt 205.0 lb

## 2012-09-23 DIAGNOSIS — D509 Iron deficiency anemia, unspecified: Secondary | ICD-10-CM | POA: Diagnosis not present

## 2012-09-23 DIAGNOSIS — I8390 Asymptomatic varicose veins of unspecified lower extremity: Secondary | ICD-10-CM | POA: Diagnosis not present

## 2012-09-23 DIAGNOSIS — I10 Essential (primary) hypertension: Secondary | ICD-10-CM | POA: Diagnosis not present

## 2012-09-23 DIAGNOSIS — E669 Obesity, unspecified: Secondary | ICD-10-CM

## 2012-09-23 DIAGNOSIS — I080 Rheumatic disorders of both mitral and aortic valves: Secondary | ICD-10-CM

## 2012-09-23 DIAGNOSIS — M25561 Pain in right knee: Secondary | ICD-10-CM

## 2012-09-23 DIAGNOSIS — M25569 Pain in unspecified knee: Secondary | ICD-10-CM

## 2012-09-23 NOTE — Assessment & Plan Note (Signed)
Resolved on last blood draw.

## 2012-09-23 NOTE — Assessment & Plan Note (Signed)
Persistent pain and crepitus 7  months after surgery encouraged to follow up with orthopaedics and call if still struggling

## 2012-09-23 NOTE — Assessment & Plan Note (Signed)
Dr Talmage Nap is following.

## 2012-09-23 NOTE — Assessment & Plan Note (Signed)
Encouraged DASH diet, increase exercise as tolerated.

## 2012-09-23 NOTE — Progress Notes (Signed)
Patient ID: [name], female   DOB: Sep 08, 1945, 67 y.o.   MRN: [id number] [name] [id number] May 15, 1945 09/23/2012      Progress Note-Follow Up  Subjective  Chief Complaint  Chief Complaint  Patient presents with  . Follow-up    3 month    HPI  Patient is a 67 year old Caucasian female who is in today for followup. She is struggling with persistent knee pain status post her total knee replacement back 7 months ago. She feels as if it is popping and near giving out at times. She is daily pain and limits her activity. She also notes persistent back pain and generalized myalgias and fatigue. No recent illness. No headache, fevers, chest pain, palpitations, shortness of breath, GI or GU concerns.  Past Medical History  Diagnosis Date  . Obesity   . Esophageal reflux   . Fibromyalgia   . Asthma   . Allergy     rhinitis  . Sleep apnea, obstructive   . Bronchitis, acute 10/31/2010  . Otitis media of both ears 11/16/2010  . Depression with anxiety 01/08/2011  . Back pain 01/08/2011  . Sinusitis 06/11/2011  . Fatigue 06/11/2011  . Knee pain, right 09/20/2011  . Otitis media 09/20/2011  . Varicose veins of lower limb 10/02/2011  . Otitis media of left ear 09/19/2010  . Motion sickness 12/24/2011  . Sinusitis 12/24/2011  . Nausea & vomiting 12/24/2011  . Heart murmur   . Panic attacks   . Arthritis 02-01-12    osteoarthritis, Back pain, spine surgeries ? retain hardware cervial and  lumbar.  . Anemia 02-01-12    iron absorption problem  . Cancer 02-01-12    squamous cell -face    Past Surgical History  Procedure Laterality Date  . Bariatric surgery    . Lumbar disc surgery    . Bladder suspension      complicated by bowel nick/ clolostomy  . Gallbladder surgery  1978  . Appendectomy  1962  . Gastric bypass  2003    Patient also noted "gastric bypass resection - 2004"  . Cervical disc surgery      C-7 in 2004, C-4 and C-5 in 2010  . Colostomy bag      Confirm with  patient. Listed under medical conditions on form dated 07/18/09.  [name]      Per medical history form dated 07/18/09.  [name] Hernia repair  2009    Two hernias and abdominal reconstruction  . Ivc filter      prophyllactically- no hx DVT  . Carpal tunnel release  02-01-12    right  . Colostomy reversal  02-01-12  . Abdominal surgery  02-01-12    ABDOMINAL SURGERY  . Vulva surgery  02-01-12    cyst removal-pt 8 months pregnant  . Total knee arthroplasty  02/11/2012    Procedure: TOTAL KNEE ARTHROPLASTY;  Surgeon: [name], MD;  Location: [facility];  Service: Orthopedics;  Laterality: Right;    Family History  Problem Relation Age of Onset  . Alcohol abuse Mother   . Other Mother     accidental med overdose  . Emphysema Mother     smoked  . Bipolar disorder Mother   . Other Father     CHF  . Diabetes Father   . Cancer Father     throat ca/ smoked  . Alcohol abuse Father   . Stroke Father   . Hypertension Brother   . Hyperlipidemia Brother   .  Obesity Brother   . Heart attack Maternal Grandfather   . Heart attack Paternal Grandmother   . Heart attack Paternal Grandfather   . Arthritis Son     psoriatic  . Arthritis Son     psoriatic  . Obesity Son     History   Social History  . Marital Status: Married    Spouse Name: N/A    Number of Children: N/A  . Years of Education: N/A   Occupational History  . Not on file.   Social History Main Topics  . Smoking status: Never Smoker   . Smokeless tobacco: Never Used  . Alcohol Use: No     Comment: special occasions  . Drug Use: No  . Sexual Activity: Yes   Other Topics Concern  . Not on file   Social History Narrative  . No narrative on file    Current Outpatient Prescriptions on File Prior to Visit  Medication Sig Dispense Refill  . ALPRAZolam (XANAX) 0.25 MG tablet Take 0.25 mg by mouth daily as needed. For anxiety      . bifidobacterium infantis (ALIGN) capsule Take 1 capsule by mouth daily.       .  calcitRIOL (ROCALTROL) 0.5 MCG capsule Take 0.5 mcg by mouth daily.       . clonazePAM (KLONOPIN) 1 MG tablet Take 1 mg by mouth at bedtime.       . DULoxetine (CYMBALTA) 60 MG capsule Take 120 mg by mouth every morning.      . Fluticasone-Salmeterol (ADVAIR DISKUS) 250-50 MCG/DOSE AEPB inhale 1 puff by mouth twice a day  180 each  1  . ipratropium (ATROVENT) 0.06 % nasal spray Place 2 sprays into the nose 2 (two) times daily.       [name] lamoTRIgine (LAMICTAL) 100 MG tablet Take 100 mg by mouth at bedtime.       [name] levocetirizine (XYZAL) 5 MG tablet Take 5 mg by mouth every morning.       . methocarbamol (ROBAXIN) 500 MG tablet Take 1 tablet (500 mg total) by mouth every 6 (six) hours as needed (muscle spasms).  50 tablet  0  . methylphenidate (RITALIN) 10 MG tablet Take 10 mg by mouth 2 (two) times daily.      . montelukast (SINGULAIR) 10 MG tablet Take 1 tablet (10 mg total) by mouth daily.  30 tablet  3  . NON FORMULARY once a week.       . NON FORMULARY CPAP- set on [employer].      . Olopatadine HCl (PATANASE) 0.6 % SOLN Place 2 sprays into the nose every morning.       [name] oxybutynin (DITROPAN) 5 MG tablet Take 1 tablet (5 mg total) by mouth 2 (two) times daily.  60 tablet  2  . pantoprazole (PROTONIX) 40 MG tablet Take 1 tablet (40 mg total) by mouth at bedtime.  30 tablet  5  . ranitidine (ZANTAC) 150 MG tablet take 1 tablet by mouth twice a day ALTERNATE WITH PANTOPRAZOLE  60 tablet  3  . traZODone (DESYREL) 50 MG tablet Take 50 mg by mouth at bedtime.        No current facility-administered medications on file prior to visit.    Allergies  Allergen Reactions  . Hydrocodone     Ask patient severity and reaction. Not indicated on medical history dated 07/18/09.02-01-12 nausea and vomiting and headaches.  . Cephalexin     Ask patient severity and reaction. Not  indicated on medical history dated 07/18/09.? Was this or Levaquin that caused Neuropathy  . Codeine     Crazy in the head, bp drops   . Levofloxacin Itching and Other (See Comments)    Neuropathy   . Meperidine Hcl     B/P drops  . Morphine And Related     Ask patient severity and reaction. Not indicated on medical history dated 07/18/09. 02-01-12 States" extreme migraines"  . Oxycodone-Acetaminophen Other (See Comments)    Crazy in head, drop in bp  . Penicillins Nausea And Vomiting  . Sulfonamide Derivatives Nausea And Vomiting    Stomach upset  . Vicodin [Hydrocodone-Acetaminophen]     Ask patient severity and reaction. Not indicated on medical history dated 07/18/09.02-01-12 same as Hydrocodone  . Adhesive [Tape] Rash    Latex in adhesive tape. Please use paper tape    Review of Systems  Review of Systems  Constitutional: Positive for malaise/fatigue. Negative for fever.  HENT: Negative for congestion.   Eyes: Negative for discharge.  Respiratory: Negative for shortness of breath.   Cardiovascular: Negative for chest pain, palpitations and leg swelling.  Gastrointestinal: Negative for nausea, abdominal pain and diarrhea.  Genitourinary: Negative for dysuria.  Musculoskeletal: Positive for myalgias, back pain and joint pain. Negative for falls.  Skin: Negative for rash.  Neurological: Negative for loss of consciousness and headaches.  Endo/Heme/Allergies: Negative for polydipsia.  Psychiatric/Behavioral: Negative for depression and suicidal ideas. The patient is not nervous/anxious and does not have insomnia.     Objective  BP 120/80  Pulse 59  Temp(Src) 98.5 F (36.9 C) (Oral)  Ht 4\' 11"  (1.499 m)  Wt 205 lb 0.6 oz (93.006 kg)  BMI 41.39 kg/m2  SpO2 97%  Physical Exam  Physical Exam  Constitutional: She is oriented to person, place, and time and well-developed, well-nourished, and in no distress. No distress.  HENT:  Head: Normocephalic and atraumatic.  Eyes: Conjunctivae are normal.  Neck: Neck supple. No thyromegaly present.  Cardiovascular: Normal rate and regular rhythm.   Murmur  heard. Pulmonary/Chest: Effort normal and breath sounds normal. She has no wheezes.  Abdominal: She exhibits no distension and no mass.  Musculoskeletal: She exhibits no edema.  Lymphadenopathy:    She has no cervical adenopathy.  Neurological: She is alert and oriented to person, place, and time.  Skin: Skin is warm and dry. No rash noted. She is not diaphoretic.  Psychiatric: Memory, affect and judgment normal.    Lab Results  Component Value Date   TSH 1.48 04/16/2012   Lab Results  Component Value Date   WBC 6.0 04/16/2012   HGB 12.8 04/16/2012   HCT 38.3 04/16/2012   MCV 91.4 04/16/2012   PLT 273.0 04/16/2012   Lab Results  Component Value Date   CREATININE 0.7 04/16/2012   BUN 15 04/16/2012   NA 142 04/16/2012   K 4.0 04/16/2012   CL 102 04/16/2012   CO2 30 04/16/2012   Lab Results  Component Value Date   ALT 12 12/07/2011   AST 15 12/07/2011   ALKPHOS 95 12/07/2011   BILITOT 0.2* 12/07/2011       Assessment & Plan  ANEMIA, IRON DEFICIENCY Resolved on last blood draw.  OBESITY Encouraged DASH diet, increase exercise as tolerated.  Varicose veins of lower limb Following with vascular surgery now. Is still symptomatic despite ablation in upper right leg.  ESSENTIAL HYPERTENSION, BENIGN Well controlled, no changes  Knee pain, right Persistent pain and crepitus 7  months after surgery encouraged to follow up with orthopaedics and call if still struggling  Hypocalcemia Dr Talmage Nap is following.   MITRAL REGURGITATION, 0 (MILD) Is seeing Dr Charleston Poot her cardiologist soon for echo and reevaluation

## 2012-09-23 NOTE — Assessment & Plan Note (Signed)
Well controlled, no changes 

## 2012-09-23 NOTE — Assessment & Plan Note (Signed)
Following with vascular surgery now. Is still symptomatic despite ablation in upper right leg.

## 2012-09-23 NOTE — Assessment & Plan Note (Signed)
Is seeing Dr Charleston Poot her cardiologist soon for echo and reevaluation

## 2012-09-23 NOTE — Patient Instructions (Addendum)
Knee Pain  The knee is the complex joint between your thigh and your lower leg. It is made up of bones, tendons, ligaments, and cartilage. The bones that make up the knee are:   The femur in the thigh.   The tibia and fibula in the lower leg.   The patella or kneecap riding in the groove on the lower femur.  CAUSES   Knee pain is a common complaint with many causes. A few of these causes are:   Injury, such as:   A ruptured ligament or tendon injury.   Torn cartilage.   Medical conditions, such as:   Gout   Arthritis   Infections   Overuse, over training or overdoing a physical activity.  Knee pain can be minor or severe. Knee pain can accompany debilitating injury. Minor knee problems often respond well to self-care measures or get well on their own. More serious injuries may need medical intervention or even surgery.  SYMPTOMS  The knee is complex. Symptoms of knee problems can vary widely. Some of the problems are:   Pain with movement and weight bearing.   Swelling and tenderness.   Buckling of the knee.   Inability to straighten or extend your knee.   Your knee locks and you cannot straighten it.   Warmth and redness with pain and fever.   Deformity or dislocation of the kneecap.  DIAGNOSIS   Determining what is wrong may be very straight forward such as when there is an injury. It can also be challenging because of the complexity of the knee. Tests to make a diagnosis may include:   Your caregiver taking a history and doing a physical exam.   Routine X-rays can be used to rule out other problems. X-rays will not reveal a cartilage tear. Some injuries of the knee can be diagnosed by:   Arthroscopy a surgical technique by which a small video camera is inserted through tiny incisions on the sides of the knee. This procedure is used to examine and repair internal knee joint problems. Tiny instruments can be used during arthroscopy to repair the torn knee cartilage (meniscus).   Arthrography  is a radiology technique. A contrast liquid is directly injected into the knee joint. Internal structures of the knee joint then become visible on X-ray film.   An MRI scan is a non x-ray radiology procedure in which magnetic fields and a computer produce two- or three-dimensional images of the inside of the knee. Cartilage tears are often visible using an MRI scanner. MRI scans have largely replaced arthrography in diagnosing cartilage tears of the knee.   Blood work.   Examination of the fluid that helps to lubricate the knee joint (synovial fluid). This is done by taking a sample out using a needle and a syringe.  TREATMENT  The treatment of knee problems depends on the cause. Some of these treatments are:   Depending on the injury, proper casting, splinting, surgery or physical therapy care will be needed.   Give yourself adequate recovery time. Do not overuse your joints. If you begin to get sore during workout routines, back off. Slow down or do fewer repetitions.   For repetitive activities such as cycling or running, maintain your strength and nutrition.   Alternate muscle groups. For example if you are a weight lifter, work the upper body on one day and the lower body the next.   Either tight or weak muscles do not give the proper support for your   knee. Tight or weak muscles do not absorb the stress placed on the knee joint. Keep the muscles surrounding the knee strong.   Take care of mechanical problems.   If you have flat feet, orthotics or special shoes may help. See your caregiver if you need help.   Arch supports, sometimes with wedges on the inner or outer aspect of the heel, can help. These can shift pressure away from the side of the knee most bothered by osteoarthritis.   A brace called an "unloader" brace also may be used to help ease the pressure on the most arthritic side of the knee.   If your caregiver has prescribed crutches, braces, wraps or ice, use as directed. The acronym for  this is PRICE. This means protection, rest, ice, compression and elevation.   Nonsteroidal anti-inflammatory drugs (NSAID's), can help relieve pain. But if taken immediately after an injury, they may actually increase swelling. Take NSAID's with food in your stomach. Stop them if you develop stomach problems. Do not take these if you have a history of ulcers, stomach pain or bleeding from the bowel. Do not take without your caregiver's approval if you have problems with fluid retention, heart failure, or kidney problems.   For ongoing knee problems, physical therapy may be helpful.   Glucosamine and chondroitin are over-the-counter dietary supplements. Both may help relieve the pain of osteoarthritis in the knee. These medicines are different from the usual anti-inflammatory drugs. Glucosamine may decrease the rate of cartilage destruction.   Injections of a corticosteroid drug into your knee joint may help reduce the symptoms of an arthritis flare-up. They may provide pain relief that lasts a few months. You may have to wait a few months between injections. The injections do have a small increased risk of infection, water retention and elevated blood sugar levels.   Hyaluronic acid injected into damaged joints may ease pain and provide lubrication. These injections may work by reducing inflammation. A series of shots may give relief for as long as 6 months.   Topical painkillers. Applying certain ointments to your skin may help relieve the pain and stiffness of osteoarthritis. Ask your pharmacist for suggestions. Many over the-counter products are approved for temporary relief of arthritis pain.   In some countries, doctors often prescribe topical NSAID's for relief of chronic conditions such as arthritis and tendinitis. A review of treatment with NSAID creams found that they worked as well as oral medications but without the serious side effects.  PREVENTION   Maintain a healthy weight. Extra pounds put  more strain on your joints.   Get strong, stay limber. Weak muscles are a common cause of knee injuries. Stretching is important. Include flexibility exercises in your workouts.   Be smart about exercise. If you have osteoarthritis, chronic knee pain or recurring injuries, you may need to change the way you exercise. This does not mean you have to stop being active. If your knees ache after jogging or playing basketball, consider switching to swimming, water aerobics or other low-impact activities, at least for a few days a week. Sometimes limiting high-impact activities will provide relief.   Make sure your shoes fit well. Choose footwear that is right for your sport.   Protect your knees. Use the proper gear for knee-sensitive activities. Use kneepads when playing volleyball or laying carpet. Buckle your seat belt every time you drive. Most shattered kneecaps occur in car accidents.   Rest when you are tired.  SEEK MEDICAL CARE IF:     You have knee pain that is continual and does not seem to be getting better.   SEEK IMMEDIATE MEDICAL CARE IF:   Your knee joint feels hot to the touch and you have a high fever.  MAKE SURE YOU:    Understand these instructions.   Will watch your condition.   Will get help right away if you are not doing well or get worse.  Document Released: 11/05/2006 Document Revised: 04/02/2011 Document Reviewed: 11/05/2006  [employer] Patient Information 2014 [employer].

## 2012-09-29 ENCOUNTER — Telehealth: Payer: Self-pay

## 2012-09-29 MED ORDER — AZITHROMYCIN 250 MG PO TABS: ORAL_TABLET | ORAL | Status: DC

## 2012-09-29 NOTE — Telephone Encounter (Signed)
Pt informed and RX sent 

## 2012-09-29 NOTE — Telephone Encounter (Signed)
Patient left a message stating that during her visit, she had stated her right ear was hurting a little bit. Pt stated in message that the right ear is really bothering her now and causing pain down her neck.  Pt would like to know if md will send a zpak into [facility] or does she need to come in?  Please advise?

## 2012-09-29 NOTE — Telephone Encounter (Signed)
OK to call her a Zpak if no improvement I should look again

## 2012-10-02 ENCOUNTER — Telehealth: Payer: Self-pay

## 2012-10-02 DIAGNOSIS — R0989 Other specified symptoms and signs involving the circulatory and respiratory systems: Secondary | ICD-10-CM | POA: Diagnosis not present

## 2012-10-02 NOTE — Telephone Encounter (Signed)
Per MD, [facility] test results show that pt doesn't need CPAP for now. Negative for CPAP.  Call [facility] and see if pt had CPAP on during testing?  [phone]

## 2012-10-06 NOTE — Telephone Encounter (Signed)
Patient informed and stated understanding.

## 2012-10-06 NOTE — Telephone Encounter (Signed)
This number is not the right number for [facility]. Will find correct number then

## 2012-10-07 ENCOUNTER — Other Ambulatory Visit: Payer: Self-pay | Admitting: Internal Medicine

## 2012-10-07 MED ORDER — MONTELUKAST SODIUM 10 MG PO TABS: 10.0000 mg | ORAL_TABLET | Freq: Every day | ORAL | Status: DC

## 2012-10-08 DIAGNOSIS — I8310 Varicose veins of unspecified lower extremity with inflammation: Secondary | ICD-10-CM | POA: Diagnosis not present

## 2012-10-08 DIAGNOSIS — M79609 Pain in unspecified limb: Secondary | ICD-10-CM | POA: Diagnosis not present

## 2012-10-09 ENCOUNTER — Ambulatory Visit: Payer: Self-pay | Admitting: Internal Medicine

## 2012-10-09 DIAGNOSIS — R0602 Shortness of breath: Secondary | ICD-10-CM | POA: Diagnosis not present

## 2012-10-09 DIAGNOSIS — I4510 Unspecified right bundle-branch block: Secondary | ICD-10-CM | POA: Diagnosis not present

## 2012-10-10 ENCOUNTER — Telehealth: Payer: Self-pay

## 2012-10-10 DIAGNOSIS — I8310 Varicose veins of unspecified lower extremity with inflammation: Secondary | ICD-10-CM | POA: Diagnosis not present

## 2012-10-10 DIAGNOSIS — M79609 Pain in unspecified limb: Secondary | ICD-10-CM | POA: Diagnosis not present

## 2012-10-10 NOTE — Telephone Encounter (Signed)
[facility] left a message stating they need to have the forms returned to them for patients CPAP.  I called the extension that was left and left a detailed message stating that pts CPAP was negative per MD so patient did not need this. And that patient has been notified

## 2012-10-16 DIAGNOSIS — I359 Nonrheumatic aortic valve disorder, unspecified: Secondary | ICD-10-CM | POA: Diagnosis not present

## 2012-10-16 DIAGNOSIS — R0789 Other chest pain: Secondary | ICD-10-CM | POA: Diagnosis not present

## 2012-10-16 DIAGNOSIS — I6529 Occlusion and stenosis of unspecified carotid artery: Secondary | ICD-10-CM | POA: Diagnosis not present

## 2012-10-16 DIAGNOSIS — Z79899 Other long term (current) drug therapy: Secondary | ICD-10-CM | POA: Diagnosis not present

## 2012-10-21 ENCOUNTER — Ambulatory Visit (INDEPENDENT_AMBULATORY_CARE_PROVIDER_SITE_OTHER): Payer: Medicare Other

## 2012-10-21 DIAGNOSIS — J309 Allergic rhinitis, unspecified: Secondary | ICD-10-CM | POA: Diagnosis not present

## 2012-10-23 ENCOUNTER — Ambulatory Visit (INDEPENDENT_AMBULATORY_CARE_PROVIDER_SITE_OTHER): Payer: Medicare Other | Admitting: Physician Assistant

## 2012-10-23 ENCOUNTER — Encounter: Payer: Self-pay | Admitting: Physician Assistant

## 2012-10-23 VITALS — BP 128/78 | HR 60 | Temp 98.0°F | Resp 18 | Ht 59.0 in | Wt 200.5 lb

## 2012-10-23 DIAGNOSIS — J209 Acute bronchitis, unspecified: Secondary | ICD-10-CM | POA: Diagnosis not present

## 2012-10-23 MED ORDER — AZITHROMYCIN 250 MG PO TABS: ORAL_TABLET | ORAL | Status: DC

## 2012-10-23 MED ORDER — BENZONATATE 100 MG PO CAPS: 100.0000 mg | ORAL_CAPSULE | Freq: Two times a day (BID) | ORAL | Status: DC | PRN

## 2012-10-23 MED ORDER — PREDNISONE 10 MG PO TABS: ORAL_TABLET | ORAL | Status: DC

## 2012-10-23 NOTE — Patient Instructions (Signed)
Please increase fluid intake.  Take Azithromycin as prescribed.  Take prednisone as prescribed until all tablets are gone.  Continue to get plenty of rest.  Cough can sometimes linger for a couple of weeks but should improve daily.  Please call if symptoms are not improving.

## 2012-10-26 DIAGNOSIS — J209 Acute bronchitis, unspecified: Secondary | ICD-10-CM | POA: Insufficient documentation

## 2012-10-26 NOTE — Assessment & Plan Note (Signed)
Rx'd Zithromax. Rx'd Tessalon Perles for cough. Rx prednisone taper. Increase fluid intake. Get plenty of rest. Daily probiotic. Continue asthma medications as prescribed. Please humidifier in bedroom. Educated patient that with any bronchitis whether viral or bacterial, cough is typically the last symptoms to dissipate.  Call or return to clinic if symptoms are not improving.

## 2012-10-26 NOTE — Progress Notes (Signed)
Patient ID: [name], female   DOB: 30-Jan-1945, 67 y.o.   MRN: [id number]  Patient presents to clinic today complaining of 2 weeks of a nonproductive, persistent cough. Cough is associated with some chest pressure and chest tightness. Patient has history of asthma requiring multiple medications. Endorses some wheeze slightly above baseline. Patient denies fever or, chills, or rash, recent sick contacts he does notice some fatigue secondary to constant coughing. Is currently using Advair and ipratropium for her asthmatic symptoms.  Had similar symptoms 3-4 weeks ago. PCP Dr. Abner Greenspan called her in a Z-Pak. Noticed improvement with her symptoms. Week later symptoms recurred.     Past Medical History  Diagnosis Date  . Obesity   . Esophageal reflux   . Fibromyalgia   . Asthma   . Allergy     rhinitis  . Sleep apnea, obstructive   . Bronchitis, acute 10/31/2010  . Otitis media of both ears 11/16/2010  . Depression with anxiety 01/08/2011  . Back pain 01/08/2011  . Sinusitis 06/11/2011  . Fatigue 06/11/2011  . Knee pain, right 09/20/2011  . Otitis media 09/20/2011  . Varicose veins of lower limb 10/02/2011  . Otitis media of left ear 09/19/2010  . Motion sickness 12/24/2011  . Sinusitis 12/24/2011  . Nausea & vomiting 12/24/2011  . Heart murmur   . Panic attacks   . Arthritis 02-01-12    osteoarthritis, Back pain, spine surgeries ? retain hardware cervial and  lumbar.  . Anemia 02-01-12    iron absorption problem  . Cancer 02-01-12    squamous cell -face    Current Outpatient Prescriptions on File Prior to Visit  Medication Sig Dispense Refill  . ALPRAZolam (XANAX) 0.25 MG tablet Take 0.25 mg by mouth daily as needed. For anxiety      . bifidobacterium infantis (ALIGN) capsule Take 1 capsule by mouth daily.       . calcitRIOL (ROCALTROL) 0.5 MCG capsule Take 0.5 mcg by mouth daily.       . clonazePAM (KLONOPIN) 1 MG tablet Take 1 mg by mouth at bedtime.       . DULoxetine (CYMBALTA) 60 MG  capsule Take 60 mg by mouth every morning.       . Fluticasone-Salmeterol (ADVAIR DISKUS) 250-50 MCG/DOSE AEPB inhale 1 puff by mouth twice a day  180 each  1  . ipratropium (ATROVENT) 0.06 % nasal spray Place 2 sprays into the nose 2 (two) times daily.       [name] lamoTRIgine (LAMICTAL) 100 MG tablet Take 50 mg by mouth at bedtime.       [name] levocetirizine (XYZAL) 5 MG tablet Take 5 mg by mouth every morning.       . methocarbamol (ROBAXIN) 500 MG tablet Take 1 tablet (500 mg total) by mouth every 6 (six) hours as needed (muscle spasms).  50 tablet  0  . methylphenidate (RITALIN) 10 MG tablet Take 10 mg by mouth 2 (two) times daily.      . montelukast (SINGULAIR) 10 MG tablet Take 1 tablet (10 mg total) by mouth daily.  30 tablet  3  . NON FORMULARY once a week.       . NON FORMULARY CPAP- set on [employer].      . Olopatadine HCl (PATANASE) 0.6 % SOLN Place 2 sprays into the nose every morning.       [name] oxybutynin (DITROPAN) 5 MG tablet Take 1 tablet (5 mg total) by mouth 2 (two) times daily.  60 tablet  2  . pantoprazole (PROTONIX) 40 MG tablet Take 1 tablet (40 mg total) by mouth at bedtime.  30 tablet  5  . ranitidine (ZANTAC) 150 MG tablet take 1 tablet by mouth twice a day ALTERNATE WITH PANTOPRAZOLE  60 tablet  3  . traZODone (DESYREL) 50 MG tablet Take 50 mg by mouth at bedtime.        No current facility-administered medications on file prior to visit.    Allergies  Allergen Reactions  . Hydrocodone     Ask patient severity and reaction. Not indicated on medical history dated 07/18/09.02-01-12 nausea and vomiting and headaches.  . Cephalexin     Ask patient severity and reaction. Not indicated on medical history dated 07/18/09.? Was this or Levaquin that caused Neuropathy  . Codeine     Crazy in the head, bp drops  . Levofloxacin Itching and Other (See Comments)    Neuropathy   . Meperidine Hcl     B/P drops  . Morphine And Related     Ask patient severity and reaction. Not indicated on  medical history dated 07/18/09. 02-01-12 States" extreme migraines"  . Oxycodone-Acetaminophen Other (See Comments)    Crazy in head, drop in bp  . Penicillins Nausea And Vomiting  . Sulfonamide Derivatives Nausea And Vomiting    Stomach upset  . Vicodin [Hydrocodone-Acetaminophen]     Ask patient severity and reaction. Not indicated on medical history dated 07/18/09.02-01-12 same as Hydrocodone  . Adhesive [Tape] Rash    Latex in adhesive tape. Please use paper tape    Family History  Problem Relation Age of Onset  . Alcohol abuse Mother   . Other Mother     accidental med overdose  . Emphysema Mother     smoked  . Bipolar disorder Mother   . Other Father     CHF  . Diabetes Father   . Cancer Father     throat ca/ smoked  . Alcohol abuse Father   . Stroke Father   . Hypertension Brother   . Hyperlipidemia Brother   . Obesity Brother   . Heart attack Maternal Grandfather   . Heart attack Paternal Grandmother   . Heart attack Paternal Grandfather   . Arthritis Son     psoriatic  . Arthritis Son     psoriatic  . Obesity Son     History   Social History  . Marital Status: Married    Spouse Name: N/A    Number of Children: N/A  . Years of Education: N/A   Social History Main Topics  . Smoking status: Never Smoker   . Smokeless tobacco: Never Used  . Alcohol Use: No     Comment: special occasions  . Drug Use: No  . Sexual Activity: Yes   Other Topics Concern  . None   Social History Narrative  . None   ROS See history of present illness. All other review of systems negative.   Filed Vitals:   10/23/12 1619  BP: 128/78  Pulse: 60  Temp: 98 F (36.7 C)  Resp: 18   Physical Exam  Vitals reviewed. Constitutional: She is oriented to person, place, and time and well-developed, well-nourished, and in no distress.  HENT:  Head: Normocephalic and atraumatic.  Right Ear: External ear normal.  Left Ear: External ear normal.  Nose: Nose normal.   Mouth/Throat: Oropharynx is clear and moist. No oropharyngeal exudate.  TM WNL bilaterally  Eyes: Conjunctivae are normal.  Neck: Neck supple.  Cardiovascular: Normal rate, regular rhythm, normal heart sounds and intact distal pulses.   Pulmonary/Chest: Effort normal and breath sounds normal. No respiratory distress. She has no wheezes. She has no rales. She exhibits no tenderness.  Lymphadenopathy:    She has no cervical adenopathy.  Neurological: She is alert and oriented to person, place, and time.  Skin: Skin is warm and dry. No rash noted.   Assessment/Plan: Acute bronchitis Rx'd Zithromax. Rx'd Tessalon Perles for cough. Rx prednisone taper. Increase fluid intake. Get plenty of rest. Daily probiotic. Continue asthma medications as prescribed. Please humidifier in bedroom. Educated patient that with any bronchitis whether viral or bacterial, cough is typically the last symptoms to dissipate.  Call or return to clinic if symptoms are not improving.

## 2012-10-27 ENCOUNTER — Ambulatory Visit: Payer: Medicare Other | Admitting: Family Medicine

## 2012-11-04 DIAGNOSIS — I8310 Varicose veins of unspecified lower extremity with inflammation: Secondary | ICD-10-CM | POA: Diagnosis not present

## 2012-11-04 DIAGNOSIS — M79609 Pain in unspecified limb: Secondary | ICD-10-CM | POA: Diagnosis not present

## 2012-11-07 ENCOUNTER — Encounter: Payer: Self-pay | Admitting: Internal Medicine

## 2012-11-07 ENCOUNTER — Ambulatory Visit (INDEPENDENT_AMBULATORY_CARE_PROVIDER_SITE_OTHER)
Admission: RE | Admit: 2012-11-07 | Discharge: 2012-11-07 | Disposition: A | Discharge status: Home / Self Care | Payer: Medicare Other | Source: Ambulatory Visit | Attending: Internal Medicine | Admitting: Internal Medicine

## 2012-11-07 ENCOUNTER — Ambulatory Visit (INDEPENDENT_AMBULATORY_CARE_PROVIDER_SITE_OTHER): Payer: Medicare Other | Admitting: Internal Medicine

## 2012-11-07 VITALS — BP 120/74 | HR 76 | Ht 59.0 in | Wt 209.0 lb

## 2012-11-07 DIAGNOSIS — J302 Other seasonal allergic rhinitis: Secondary | ICD-10-CM

## 2012-11-07 DIAGNOSIS — E669 Obesity, unspecified: Secondary | ICD-10-CM

## 2012-11-07 DIAGNOSIS — J309 Allergic rhinitis, unspecified: Secondary | ICD-10-CM

## 2012-11-07 DIAGNOSIS — R05 Cough: Secondary | ICD-10-CM

## 2012-11-07 DIAGNOSIS — R059 Cough, unspecified: Secondary | ICD-10-CM

## 2012-11-07 DIAGNOSIS — G4733 Obstructive sleep apnea (adult) (pediatric): Secondary | ICD-10-CM | POA: Diagnosis not present

## 2012-11-07 DIAGNOSIS — R053 Chronic cough: Secondary | ICD-10-CM

## 2012-11-07 DIAGNOSIS — Z23 Encounter for immunization: Secondary | ICD-10-CM

## 2012-11-07 DIAGNOSIS — J329 Chronic sinusitis, unspecified: Secondary | ICD-10-CM | POA: Diagnosis not present

## 2012-11-07 DIAGNOSIS — J45909 Unspecified asthma, uncomplicated: Secondary | ICD-10-CM

## 2012-11-07 MED ORDER — CLARITHROMYCIN 500 MG PO TABS: ORAL_TABLET | ORAL | Status: DC

## 2012-11-07 MED ORDER — HYDROCOD POLST-CHLORPHEN POLST 10-8 MG/5ML PO LQCR: 5.0000 mL | Freq: Two times a day (BID) | ORAL | Status: DC | PRN

## 2012-11-07 NOTE — Patient Instructions (Signed)
Script for tussionex to use occasionally  Script for biaxin antibiotic  Flu vax  Order- CXR-  Dx chronic cough  Order limited CT max fac  Dx sinusitis  Backoff on Xyzal- may be overdrying.   Try otc artificial tears- Tarri Glenn, [facility]

## 2012-11-07 NOTE — Progress Notes (Signed)
Subjective:    Patient ID: [name], female    DOB: 1945-10-09, 67 y.o.   MRN: [id number]  HPI 09/21/10- 67 year old female never smoker followed for allergic rhinitis, asthma, OSA, complicated by GERD, DM, HBP, obesity. Last here August 08, 2009 CPAP 12- Able to wear it all night, comfortable now. . Colostomy (colon nicked in a bladder sling procedure) was reversed. Had to revise the wound for staph and incidentally had a squamous cell skin cancer removed. Had iron infusion for anemia. No major respiratory problems through the past year. For the past 2-3 weeks has had burning and tearing of eyes with some postnasal drip, raspy throat.  Daily xyzal, patanase, singulair and ipratropium eye nasal spray..  Not aware of drafts into eye. Tried optivar, visine. Denies glaucoma.  08/02/11- 67 year old female never smoker followed for allergic rhinitis, asthma, OSA, complicated by GERD, DM, HBP, obesity. Still on vaccine, cough -productive-light yellow in color(finished Zpak today; both ears painful(full),and nasal drainage. Still using CPAP every night  and will get with DME for mask options. Bronchitis x5 days with dark yellow sputum. Color clearing with Z-Pak. Admits wheezing and frontal sinus pressure without headache, fever, sore throat or grossly purulent sputum.  11/07/12- 67 year old female never smoker followed for allergic rhinitis, asthma, OSA, complicated by GERD, DM, HBP, obesity. FOLLOWS FOR: still on allergy vaccine and no reactions; cough x 6 weeks(has had 2 rds of abx and prednisone)Finished the prednisone monday. NPSG 03/23/1999- AHI 156/ hr, weight 202lbs before bariatric surgery, CPAP recently 12. [facility] screen 07/24/12- AHI 2.0/ hr, weight 209lbs.  Bronchitis/sinusitis responded in July to a Z-Pak and prednisone from her primary physician. She then relapsed and a second round of Z-Pak and prednisone ended last week. Persistent dry hacking cough increased by  activity and temperature changes. She is using Advair Singulair and Tessalon. She says she can tolerate Tussionex but not other hydrocodone products.  Review of Systems-see HPI  Constitutional:   No-   weight loss, night sweats, fevers, chills, fatigue, lassitude. HEENT:   No-  headaches, difficulty swallowing, tooth/dental problems, sore throat,       No-  sneezing, itching, ear ache, + nasal congestion, post nasal drip,  CV:  No-   chest pain, orthopnea, PND, swelling in lower extremities, anasarca, dizziness, palpitations Resp: No-   shortness of breath with exertion or at rest.              No- productive cough,  + non-productive cough,  No-  coughing up of blood.              No-   change in color of mucus.  No- wheezing.   Skin: No-   rash or lesions. GI:  No-   heartburn, indigestion, abdominal pain, nausea, vomiting, GU:  MS:  No-   joint pain or swelling.   Neuro- nothing unusual Psych:  No- change in mood or affect. No depression or anxiety.  No memory loss.    Objective:   Physical Exam General- Alert, Oriented, Affect-appropriate, Distress- none acute  Very overweight Skin- rash-none, lesions- none, excoriation- none Lymphadenopathy- none Head- atraumatic            Eyes- Gross vision intact, PERRLA, conjunctivae clear, with watery thin secretions            Ears- Hearing, canals normal            Nose- Clear, No- Septal dev, mucus, polyps, erosion, perforation,  Throat- Mallampati II , mucosa clear , drainage- none, tonsils- atrophic Neck- flexible , trachea midline, no stridor , thyroid nl, carotid no bruit Chest - symmetrical excursion , unlabored           Heart/CV- RRR , no murmur , no gallop  , no rub, nl s1 s2                           - JVD- none , edema- none, stasis changes- none, varices- right calf           Lung-  Cough+ dry, wheeze- none , dullness-none, rub- none           Chest wall-  Abd-  Br/ Gen/ Rectal- Not done, not indicated Extrem-  cyanosis- none, clubbing, none, atrophy- none, strength- nl Neuro- grossly intact to observation  Assessment & Plan:

## 2012-11-10 ENCOUNTER — Telehealth: Payer: Self-pay | Admitting: Internal Medicine

## 2012-11-10 DIAGNOSIS — R079 Chest pain, unspecified: Secondary | ICD-10-CM | POA: Diagnosis not present

## 2012-11-10 DIAGNOSIS — R0602 Shortness of breath: Secondary | ICD-10-CM | POA: Diagnosis not present

## 2012-11-10 NOTE — Telephone Encounter (Signed)
Pt states she did not receive rx when she left office on Friday.  Spoke with Florentina Addison and she remembers giving pt rx's .  Pt si going to check her car and see if the yare in there and call us back.  If not we will need to reprint them and leave for pt to pick up.

## 2012-11-10 NOTE — Telephone Encounter (Signed)
Patient calling back states she found rx's in her car.  Sorry for the inconvenience.

## 2012-11-12 ENCOUNTER — Telehealth: Payer: Self-pay | Admitting: *Deleted

## 2012-11-12 ENCOUNTER — Other Ambulatory Visit: Payer: Medicare Other

## 2012-11-12 NOTE — Telephone Encounter (Signed)
Make sure she is taking a probiotic, a zinc containing product and some vitamin C as well as hydrating well

## 2012-11-12 NOTE — Telephone Encounter (Signed)
Patient reports that after completing [2] rounds of ABX for Bronchitis and having chronic sinus infections that she was seen by Dr. Maple Hudson in Pulmonary who ordered a Chest CT [which was normal] & Sinus CT [which she has not gotten the results back yet] and placed her on Biaxin and was told to D/C Lipitor [told she could not take at same time]. Patient reports that "she cannot stay awake, that she goes to work & when she gets home, she goes to bed" and inquires as to your recommendations to "shake the fatigue"/SLS Please Advise.

## 2012-11-14 ENCOUNTER — Ambulatory Visit (INDEPENDENT_AMBULATORY_CARE_PROVIDER_SITE_OTHER)
Admission: RE | Admit: 2012-11-14 | Discharge: 2012-11-14 | Disposition: A | Discharge status: Home / Self Care | Payer: Medicare Other | Source: Ambulatory Visit | Attending: Internal Medicine | Admitting: Internal Medicine

## 2012-11-14 DIAGNOSIS — J329 Chronic sinusitis, unspecified: Secondary | ICD-10-CM | POA: Diagnosis not present

## 2012-11-14 NOTE — Telephone Encounter (Signed)
I informed pt and she stated she can't take zinc or vitamin c. Pt stated that's why she has to get iron infusions. Pt stated she is taking Librarian, academic.

## 2012-11-15 NOTE — Telephone Encounter (Signed)
No other great thoughts over the phone, would need to come in if she continues to worsen

## 2012-11-17 NOTE — Telephone Encounter (Signed)
Patient informed and voiced understanding

## 2012-11-18 DIAGNOSIS — I8310 Varicose veins of unspecified lower extremity with inflammation: Secondary | ICD-10-CM | POA: Diagnosis not present

## 2012-11-18 DIAGNOSIS — M7981 Nontraumatic hematoma of soft tissue: Secondary | ICD-10-CM | POA: Diagnosis not present

## 2012-11-18 DIAGNOSIS — M79609 Pain in unspecified limb: Secondary | ICD-10-CM | POA: Diagnosis not present

## 2012-11-20 NOTE — Progress Notes (Signed)
Quick Note:  Advised pt of cxr results per CY. Pt verbalized understanding and has no further questions ______

## 2012-11-23 NOTE — Assessment & Plan Note (Addendum)
She had felt that allergy vaccine was working for her. We agreed to watch as the spring season starts Plan-limited CT scan of sinuses

## 2012-11-23 NOTE — Assessment & Plan Note (Addendum)
Continues Advair, Singulair, allergy vaccine Plan-discussed post nasal drip, upper airway cough syndrome and reflux as potential aggravating factors to watch. Flu vaccine. Agreed to trial of Tussionex, CXR

## 2012-11-23 NOTE — Assessment & Plan Note (Signed)
Large difference between 2 different recording systems, 13 years apart. We will look at options when we have her cough complaint under better control

## 2012-11-28 ENCOUNTER — Encounter: Payer: Self-pay | Admitting: Family Medicine

## 2012-11-28 ENCOUNTER — Ambulatory Visit (INDEPENDENT_AMBULATORY_CARE_PROVIDER_SITE_OTHER): Payer: Medicare Other | Admitting: Family Medicine

## 2012-11-28 VITALS — BP 138/88 | HR 98 | Temp 98.2°F | Ht 59.0 in | Wt 207.0 lb

## 2012-11-28 DIAGNOSIS — R109 Unspecified abdominal pain: Secondary | ICD-10-CM | POA: Diagnosis not present

## 2012-11-28 DIAGNOSIS — R5381 Other malaise: Secondary | ICD-10-CM

## 2012-11-28 DIAGNOSIS — I10 Essential (primary) hypertension: Secondary | ICD-10-CM | POA: Diagnosis not present

## 2012-11-28 DIAGNOSIS — R05 Cough: Secondary | ICD-10-CM | POA: Diagnosis not present

## 2012-11-28 DIAGNOSIS — G4733 Obstructive sleep apnea (adult) (pediatric): Secondary | ICD-10-CM

## 2012-11-28 DIAGNOSIS — F341 Dysthymic disorder: Secondary | ICD-10-CM

## 2012-11-28 DIAGNOSIS — IMO0001 Reserved for inherently not codable concepts without codable children: Secondary | ICD-10-CM

## 2012-11-28 DIAGNOSIS — E669 Obesity, unspecified: Secondary | ICD-10-CM

## 2012-11-28 DIAGNOSIS — R059 Cough, unspecified: Secondary | ICD-10-CM | POA: Diagnosis not present

## 2012-11-28 DIAGNOSIS — D649 Anemia, unspecified: Secondary | ICD-10-CM | POA: Diagnosis not present

## 2012-11-28 DIAGNOSIS — F418 Other specified anxiety disorders: Secondary | ICD-10-CM

## 2012-11-28 DIAGNOSIS — G4730 Sleep apnea, unspecified: Secondary | ICD-10-CM

## 2012-11-28 LAB — CBC
HCT: 35.5 % — ABNORMAL LOW (ref 36.0–46.0)
MCH: 29.7 pg (ref 26.0–34.0)
MCV: 89.4 fL (ref 78.0–100.0)
RBC: 3.97 MIL/uL (ref 3.87–5.11)
WBC: 6.5 10*3/uL (ref 4.0–10.5)

## 2012-11-28 LAB — RENAL FUNCTION PANEL
Albumin: 3.8 g/dL (ref 3.5–5.2)
BUN: 16 mg/dL (ref 6–23)
CO2: 27 mEq/L (ref 19–32)
Calcium: 8.9 mg/dL (ref 8.4–10.5)
Chloride: 106 mEq/L (ref 96–112)
Glucose, Bld: 88 mg/dL (ref 70–99)
Sodium: 141 mEq/L (ref 135–145)

## 2012-11-28 LAB — HEPATIC FUNCTION PANEL
ALT: 9 U/L (ref 0–35)
AST: 14 U/L (ref 0–37)
Alkaline Phosphatase: 91 U/L (ref 39–117)
Bilirubin, Direct: 0.1 mg/dL (ref 0.0–0.3)
Indirect Bilirubin: 0.3 mg/dL (ref 0.0–0.9)

## 2012-11-28 MED ORDER — HYDROCODONE-HOMATROPINE 5-1.5 MG/5ML PO SYRP: 5.0000 mL | ORAL_SOLUTION | Freq: Three times a day (TID) | ORAL | Status: DC | PRN

## 2012-11-28 NOTE — Patient Instructions (Signed)

## 2012-11-29 LAB — URINALYSIS
Bilirubin Urine: NEGATIVE
Glucose, UA: NEGATIVE mg/dL
Hgb urine dipstick: NEGATIVE
Ketones, ur: NEGATIVE mg/dL
Leukocytes, UA: NEGATIVE
Specific Gravity, Urine: 1.013 (ref 1.005–1.030)
pH: 5.5 (ref 5.0–8.0)

## 2012-11-29 LAB — URINE CULTURE: Colony Count: 7000

## 2012-12-01 ENCOUNTER — Telehealth: Payer: Self-pay

## 2012-12-01 NOTE — Progress Notes (Signed)
[name] [id number] [birth year] 12/01/2012      Progress Note-Follow Up  Subjective  Chief Complaint  Chief Complaint  Patient presents with  . Fatigue    can't stay awake    HPI  Patient is a 67 year old Caucasian female who is in today for complaints of fatigue. She reports being so tired with transferring to the point where she has difficulty staying asleep discussed in the afternoon. She does have a competent past medical history including allergies, diabetes, fibromyalgia. No recent fevers or chills. Denies chest pain or palpitations. No shortness of breath GI or GU concerns. Does have some cough with low-grade fevers and chills intermittently as well as complaints of dry burning eyes.   Past Medical History  Diagnosis Date  . Obesity   . Esophageal reflux   . Fibromyalgia   . Asthma   . Allergy     rhinitis  . Sleep apnea, obstructive   . Bronchitis, acute 10/31/2010  . Otitis media of both ears 11/16/2010  . Depression with anxiety 01/08/2011  . Back pain 01/08/2011  . Sinusitis 06/11/2011  . Fatigue 06/11/2011  . Knee pain, right 09/20/2011  . Otitis media 09/20/2011  . Varicose veins of lower limb 10/02/2011  . Otitis media of left ear 09/19/2010  . Motion sickness 12/24/2011  . Sinusitis 12/24/2011  . Nausea & vomiting 12/24/2011  . Heart murmur   . Panic attacks   . Arthritis 02-01-12    osteoarthritis, Back pain, spine surgeries ? retain hardware cervial and  lumbar.  . Anemia 02-01-12    iron absorption problem  . Cancer 02-01-12    squamous cell -face    Past Surgical History  Procedure Laterality Date  . Bariatric surgery    . Lumbar disc surgery    . Bladder suspension      complicated by bowel nick/ clolostomy  . Gallbladder surgery  1978  . Appendectomy  1962  . Gastric bypass  2003    Patient also noted "gastric bypass resection - 2004"  . Cervical disc surgery      C-7 in 2004, C-4 and C-5 in 2010  . Colostomy bag      Confirm with patient.  Listed under medical conditions on form dated 07/18/09.  [name]      Per medical history form dated 07/18/09.  [name] Hernia repair  2009    Two hernias and abdominal reconstruction  . Ivc filter      prophyllactically- no hx DVT  . Carpal tunnel release  02-01-12    right  . Colostomy reversal  02-01-12  . Abdominal surgery  02-01-12    ABDOMINAL SURGERY  . Vulva surgery  02-01-12    cyst removal-pt 8 months pregnant  . Total knee arthroplasty  02/11/2012    Procedure: TOTAL KNEE ARTHROPLASTY;  Surgeon: [name], MD;  Location: [facility];  Service: Orthopedics;  Laterality: Right;    Family History  Problem Relation Age of Onset  . Alcohol abuse Mother   . Other Mother     accidental med overdose  . Emphysema Mother     smoked  . Bipolar disorder Mother   . Other Father     CHF  . Diabetes Father   . Cancer Father     throat ca/ smoked  . Alcohol abuse Father   . Stroke Father   . Hypertension Brother   . Hyperlipidemia Brother   . Obesity Brother   . Heart attack Maternal  Grandfather   . Heart attack Paternal Grandmother   . Heart attack Paternal Grandfather   . Arthritis Son     psoriatic  . Arthritis Son     psoriatic  . Obesity Son     History   Social History  . Marital Status: Married    Spouse Name: N/A    Number of Children: N/A  . Years of Education: N/A   Occupational History  . Not on file.   Social History Main Topics  . Smoking status: Never Smoker   . Smokeless tobacco: Never Used  . Alcohol Use: No     Comment: special occasions  . Drug Use: No  . Sexual Activity: Yes   Other Topics Concern  . Not on file   Social History Narrative  . No narrative on file    Current Outpatient Prescriptions on File Prior to Visit  Medication Sig Dispense Refill  . ALPRAZolam (XANAX) 0.25 MG tablet Take 0.25 mg by mouth daily as needed. For anxiety      . aspirin 81 MG tablet Take 81 mg by mouth daily.      [name] atorvastatin (LIPITOR) 10 MG tablet  Take 10 mg by mouth daily.      . benzonatate (TESSALON) 100 MG capsule Take 1 capsule (100 mg total) by mouth 2 (two) times daily as needed for cough.  20 capsule  0  . bifidobacterium infantis (ALIGN) capsule Take 1 capsule by mouth daily.       . calcitRIOL (ROCALTROL) 0.5 MCG capsule Take 0.5 mcg by mouth daily.       . clonazePAM (KLONOPIN) 1 MG tablet Take 1 mg by mouth at bedtime.       . DULoxetine (CYMBALTA) 60 MG capsule Take 60 mg by mouth every morning.       . Fluticasone-Salmeterol (ADVAIR DISKUS) 250-50 MCG/DOSE AEPB inhale 1 puff by mouth twice a day  180 each  1  . ipratropium (ATROVENT) 0.06 % nasal spray Place 2 sprays into the nose 2 (two) times daily.       [name] lamoTRIgine (LAMICTAL) 100 MG tablet Take 50 mg by mouth at bedtime.       [name] levocetirizine (XYZAL) 5 MG tablet Take 5 mg by mouth every morning.       . methocarbamol (ROBAXIN) 500 MG tablet Take 1 tablet (500 mg total) by mouth every 6 (six) hours as needed (muscle spasms).  50 tablet  0  . methylphenidate (RITALIN) 10 MG tablet Take 10 mg by mouth 2 (two) times daily.      . montelukast (SINGULAIR) 10 MG tablet Take 1 tablet (10 mg total) by mouth daily.  30 tablet  3  . NON FORMULARY once a week.       . NON FORMULARY CPAP- set on [employer].      . Olopatadine HCl (PATANASE) 0.6 % SOLN Place 2 sprays into the nose every morning.       [name] oxybutynin (DITROPAN) 5 MG tablet Take 1 tablet (5 mg total) by mouth 2 (two) times daily.  60 tablet  2  . pantoprazole (PROTONIX) 40 MG tablet Take 1 tablet (40 mg total) by mouth at bedtime.  30 tablet  5  . ranitidine (ZANTAC) 150 MG tablet take 1 tablet by mouth twice a day ALTERNATE WITH PANTOPRAZOLE  60 tablet  3  . traZODone (DESYREL) 50 MG tablet Take 50 mg by mouth at bedtime.  No current facility-administered medications on file prior to visit.    Allergies  Allergen Reactions  . Hydrocodone     Ask patient severity and reaction. Not indicated on medical history  dated 07/18/09.02-01-12 nausea and vomiting and headaches.  . Cephalexin     Ask patient severity and reaction. Not indicated on medical history dated 07/18/09.? Was this or Levaquin that caused Neuropathy  . Codeine     Crazy in the head, bp drops  . Levofloxacin Itching and Other (See Comments)    Neuropathy   . Meperidine Hcl     B/P drops  . Morphine And Related     Ask patient severity and reaction. Not indicated on medical history dated 07/18/09. 02-01-12 States" extreme migraines"  . Oxycodone-Acetaminophen Other (See Comments)    Crazy in head, drop in bp  . Penicillins Nausea And Vomiting  . Sulfonamide Derivatives Nausea And Vomiting    Stomach upset  . Vicodin [Hydrocodone-Acetaminophen]     Ask patient severity and reaction. Not indicated on medical history dated 07/18/09.02-01-12 same as Hydrocodone  . Adhesive [Tape] Rash    Latex in adhesive tape. Please use paper tape    Review of Systems  Review of Systems  Constitutional: Positive for malaise/fatigue. Negative for fever.  HENT: Positive for congestion.   Eyes: Positive for blurred vision and pain. Negative for photophobia and discharge.  Respiratory: Positive for cough. Negative for shortness of breath.   Cardiovascular: Negative.  Negative for chest pain, palpitations and leg swelling.  Gastrointestinal: Negative for nausea, abdominal pain and diarrhea.  Genitourinary: Negative for dysuria.  Musculoskeletal: Positive for back pain, joint pain and myalgias. Negative for falls.  Skin: Negative for rash.  Neurological: Negative for loss of consciousness and headaches.  Endo/Heme/Allergies: Negative for polydipsia.  Psychiatric/Behavioral: Negative for depression and suicidal ideas. The patient is not nervous/anxious and does not have insomnia.     Objective  BP 138/88  Pulse 98  Temp(Src) 98.2 F (36.8 C) (Oral)  Ht 4\' 11"  (1.499 m)  Wt 207 lb (93.895 kg)  BMI 41.79 kg/m2  SpO2 96%  Physical Exam  Physical  Exam  Constitutional: She is oriented to person, place, and time and well-developed, well-nourished, and in no distress. No distress.  HENT:  Head: Normocephalic and atraumatic.  Eyes: Conjunctivae are normal.  Neck: Neck supple. No thyromegaly present.  Cardiovascular: Normal rate, regular rhythm and normal heart sounds.   No murmur heard. Pulmonary/Chest: Effort normal and breath sounds normal. She has no wheezes.  Abdominal: She exhibits no distension and no mass.  Musculoskeletal: She exhibits no edema.  Lymphadenopathy:    She has no cervical adenopathy.  Neurological: She is alert and oriented to person, place, and time.  Skin: Skin is warm and dry. No rash noted. She is not diaphoretic.  Psychiatric: Memory, affect and judgment normal.    Lab Results  Component Value Date   TSH 2.199 11/28/2012   Lab Results  Component Value Date   WBC 6.5 11/28/2012   HGB 11.8* 11/28/2012   HCT 35.5* 11/28/2012   MCV 89.4 11/28/2012   PLT 278 11/28/2012   Lab Results  Component Value Date   CREATININE 0.83 11/28/2012   BUN 16 11/28/2012   NA 141 11/28/2012   K 4.1 11/28/2012   CL 106 11/28/2012   CO2 27 11/28/2012   Lab Results  Component Value Date   ALT 9 11/28/2012   AST 14 11/28/2012   ALKPHOS 91 11/28/2012  BILITOT 0.4 11/28/2012   No results found for this basename: CHOL   No results found for this basename: HDL   No results found for this basename: LDLCALC   No results found for this basename: TRIG   No results found for this basename: CHOLHDL     Assessment & Plan  Depression with anxiety Patient very frustrated about her levelof fatigue and anxiety but still feels like Cymbalta works better for her than anything else  ESSENTIAL HYPERTENSION, BENIGN Well controlled, no changes today  FIBROMYALGIA Chronic pain persists despite meds, patient reminded to keep as busy as possible, activity as tolerated  SLEEP APNEA, OBSTRUCTIVE Recent home sleep study did not  register significant sleep apnea but she continues to use her CPAP because without it she feels even more tired and exhausted. Will refer back to pulmonology for further consideration.  OBESITY Encouraged DASH diet

## 2012-12-01 NOTE — Assessment & Plan Note (Signed)
Chronic pain persists despite meds, patient reminded to keep as busy as possible, activity as tolerated

## 2012-12-01 NOTE — Assessment & Plan Note (Signed)
Patient very frustrated about her levelof fatigue and anxiety but still feels like Cymbalta works better for her than anything else

## 2012-12-01 NOTE — Assessment & Plan Note (Signed)
Encouraged DASH diet 

## 2012-12-01 NOTE — Telephone Encounter (Signed)
Message copied by Eulis Manly on Mon Dec 01, 2012  1:28 PM ------      Message from: Danise Edge A      Created: Sat Nov 29, 2012  9:48 PM       Notify all normal except very mild anemia is back, take a multivitamin with iron daily ------

## 2012-12-01 NOTE — Assessment & Plan Note (Signed)
Well controlled, no changes today 

## 2012-12-01 NOTE — Telephone Encounter (Signed)
So it is very slight but if she really wants to see hematology she can. Also there are MV gummies with iron these days. She can also increase leafy greens and cook in cast iron. It also would not hurt for her to run an IFOB. Repeat a cbc in 1 month

## 2012-12-01 NOTE — Assessment & Plan Note (Signed)
Recent home sleep study did not register significant sleep apnea but she continues to use her CPAP because without it she feels even more tired and exhausted. Will refer back to pulmonology for further consideration.

## 2012-12-01 NOTE — Telephone Encounter (Signed)
Called patient to inform about result and instructed to start taking multivitamin with iron and patient sated that she is unable to takt those types of supplements. She stated that when her iron is low she has to go to the cancer center and have an infusion. Wanted to know what other options were available. Please advise,   Thanks!

## 2012-12-01 NOTE — Telephone Encounter (Signed)
Patient informed. Stated that she did not see the need to go to hematology for a mild case of anemia. Stated that she will schedule labs soon.

## 2012-12-03 ENCOUNTER — Other Ambulatory Visit: Payer: Self-pay | Admitting: Family Medicine

## 2012-12-04 DIAGNOSIS — M79609 Pain in unspecified limb: Secondary | ICD-10-CM | POA: Diagnosis not present

## 2012-12-04 DIAGNOSIS — M7981 Nontraumatic hematoma of soft tissue: Secondary | ICD-10-CM | POA: Diagnosis not present

## 2012-12-04 DIAGNOSIS — I8310 Varicose veins of unspecified lower extremity with inflammation: Secondary | ICD-10-CM | POA: Diagnosis not present

## 2012-12-09 DIAGNOSIS — M545 Low back pain, unspecified: Secondary | ICD-10-CM | POA: Diagnosis not present

## 2012-12-16 DIAGNOSIS — Z79899 Other long term (current) drug therapy: Secondary | ICD-10-CM | POA: Diagnosis not present

## 2012-12-16 DIAGNOSIS — R0789 Other chest pain: Secondary | ICD-10-CM | POA: Diagnosis not present

## 2012-12-25 ENCOUNTER — Ambulatory Visit (INDEPENDENT_AMBULATORY_CARE_PROVIDER_SITE_OTHER): Payer: Medicare Other | Admitting: Internal Medicine

## 2012-12-25 ENCOUNTER — Encounter: Payer: Self-pay | Admitting: Internal Medicine

## 2012-12-25 VITALS — BP 140/84 | HR 58 | Ht 59.0 in | Wt 207.6 lb

## 2012-12-25 DIAGNOSIS — G4710 Hypersomnia, unspecified: Secondary | ICD-10-CM

## 2012-12-25 DIAGNOSIS — J309 Allergic rhinitis, unspecified: Secondary | ICD-10-CM

## 2012-12-25 DIAGNOSIS — G47419 Narcolepsy without cataplexy: Secondary | ICD-10-CM

## 2012-12-25 DIAGNOSIS — G4733 Obstructive sleep apnea (adult) (pediatric): Secondary | ICD-10-CM | POA: Diagnosis not present

## 2012-12-25 DIAGNOSIS — J302 Other seasonal allergic rhinitis: Secondary | ICD-10-CM

## 2012-12-25 NOTE — Progress Notes (Signed)
Subjective:    Patient ID: [name], female    DOB: [birth year], 67 y.o.   MRN: [id number]  HPI 09/21/10- 67 year old female never smoker followed for allergic rhinitis, asthma, OSA, complicated by GERD, DM, HBP, obesity. Last here August 08, 2009 CPAP 12- Able to wear it all night, comfortable now. . Colostomy (colon nicked in a bladder sling procedure) was reversed. Had to revise the wound for staph and incidentally had a squamous cell skin cancer removed. Had iron infusion for anemia. No major respiratory problems through the past year. For the past 2-3 weeks has had burning and tearing of eyes with some postnasal drip, raspy throat.  Daily xyzal, patanase, singulair and ipratropium eye nasal spray..  Not aware of drafts into eye. Tried optivar, visine. Denies glaucoma.  08/02/11- 67 year old female never smoker followed for allergic rhinitis, asthma, OSA, complicated by GERD, DM, HBP, obesity. Still on vaccine, cough -productive-light yellow in color(finished Zpak today; both ears painful(full),and nasal drainage. Still using CPAP every night  and will get with DME for mask options. Bronchitis x5 days with dark yellow sputum. Color clearing with Z-Pak. Admits wheezing and frontal sinus pressure without headache, fever, sore throat or grossly purulent sputum.  11/07/12- 66 year old female never smoker followed for allergic rhinitis, asthma, OSA, complicated by GERD, DM, HBP, obesity. FOLLOWS FOR: still on allergy vaccine and no reactions; cough x 6 weeks(has had 2 rds of abx and prednisone)Finished the prednisone monday. NPSG 03/23/1999- AHI 156/ hr, weight 202lbs before bariatric surgery, CPAP recently 12. [facility] Sleep screen 07/24/12- AHI 2.0/ hr, weight 209lbs.  Bronchitis/sinusitis responded in July to a Z-Pak and prednisone from her primary physician. She then relapsed and a second round of Z-Pak and prednisone ended last week. Persistent dry hacking cough increased by  activity and temperature changes. She is using Advair Singulair and Tessalon. She says she can tolerate Tussionex but not other hydrocodone products.  12/25/12-67 year old female never smoker followed for allergic rhinitis, asthma, OSA, complicated by GERD, DM, HBP, obesity. Allergy vaccine 1:10 GO FOLLOWS FOR: still on vaccine and doing well; discuss ordering full NPSG Cough is finally better. Blames postnasal drip despite Patanase and ipratropium. No sinus pressure. Taking clonazepam 1 mg at bedtime for leg jerks. Ritalin only if can't stay awake at work. Always tired and thinks her CPAP is not working. Interested in update full sleep study to reevaluate. CXR 11/07/12 IMPRESSION:  No acute cardiopulmonary process. Left midlung atelectasis and or  scarring.  Electronically Signed  By: Annia Belt M.D.  On: 11/07/2012 17:12  Review of Systems-see HPI  Constitutional:   No-   weight loss, night sweats, fevers, chills, +fatigue, lassitude. HEENT:   No-  headaches, difficulty swallowing, tooth/dental problems, sore throat,       No-  sneezing, itching, ear ache, + nasal congestion, +post nasal drip,  CV:  No-   chest pain, orthopnea, PND, swelling in lower extremities, anasarca, dizziness, palpitations Resp: No-   shortness of breath with exertion or at rest.              No- productive cough,  + non-productive cough,  No-  coughing up of blood.              No-   change in color of mucus.  No- wheezing.   Skin: No-   rash or lesions. GI:  No-   heartburn, indigestion, abdominal pain, nausea, vomiting, GU:  MS:  No-   joint pain or swelling.  Neuro- nothing unusual Psych:  No- change in mood or affect. No depression or anxiety.  No memory loss.    Objective:   Physical Exam General- Alert, Oriented, Affect-appropriate, Distress- none acute  Very overweight Skin- rash-none, lesions- none, excoriation- none Lymphadenopathy- none Head- atraumatic            Eyes- Gross vision intact,  PERRLA, conjunctivae clear, with watery thin secretions            Ears- Hearing, canals normal            Nose- Clear, No- Septal dev, mucus, polyps, erosion, perforation,             Throat- Mallampati II , mucosa clear , drainage- none, tonsils- atrophic Neck- flexible , trachea midline, no stridor , thyroid nl, carotid no bruit Chest - symmetrical excursion , unlabored           Heart/CV- RRR , no murmur , no gallop  , no rub, nl s1 s2                           - JVD- none , edema- none, stasis changes- none, varices- right calf           Lung-  Clear chest, Cough-none, wheeze- none , dullness-none, rub- none           Chest wall-  Abd-  Br/ Gen/ Rectal- Not done, not indicated Extrem- cyanosis- none, clubbing, none, atrophy- none, strength- nl Neuro- grossly intact to observation  Assessment & Plan:

## 2012-12-25 NOTE — Patient Instructions (Signed)
Order- schedule NPSG split protocol for dx OSA                            To be followed next morning by a MSLT (Multiple Sleep Latency Test) for dx hypersomnia/ narcolepsy    You will not take clonazepam, ritalin or cough syrup on those two days

## 2012-12-26 ENCOUNTER — Encounter: Payer: Self-pay | Admitting: Family Medicine

## 2012-12-26 ENCOUNTER — Ambulatory Visit (INDEPENDENT_AMBULATORY_CARE_PROVIDER_SITE_OTHER): Payer: Medicare Other | Admitting: Family Medicine

## 2012-12-26 VITALS — BP 122/84 | HR 54 | Temp 99.2°F | Ht 59.0 in | Wt 205.0 lb

## 2012-12-26 DIAGNOSIS — E119 Type 2 diabetes mellitus without complications: Secondary | ICD-10-CM

## 2012-12-26 DIAGNOSIS — E669 Obesity, unspecified: Secondary | ICD-10-CM | POA: Diagnosis not present

## 2012-12-26 DIAGNOSIS — G4733 Obstructive sleep apnea (adult) (pediatric): Secondary | ICD-10-CM

## 2012-12-26 DIAGNOSIS — T7840XD Allergy, unspecified, subsequent encounter: Secondary | ICD-10-CM

## 2012-12-26 DIAGNOSIS — Z5189 Encounter for other specified aftercare: Secondary | ICD-10-CM

## 2012-12-26 DIAGNOSIS — K219 Gastro-esophageal reflux disease without esophagitis: Secondary | ICD-10-CM

## 2012-12-26 NOTE — Patient Instructions (Signed)

## 2012-12-26 NOTE — Progress Notes (Signed)
Pre visit review using our clinic review tool, if applicable. No additional management support is needed unless otherwise documented below in the visit note. 

## 2012-12-26 NOTE — Progress Notes (Signed)
Patient ID: [name], female   DOB: [birth year], 67 y.o.   MRN: [id number] [name] [id number] [birth year] 12/26/2012      Progress Note-Follow Up  Subjective  Chief Complaint  Chief Complaint  Patient presents with  . Follow-up    3 month    HPI  Patient is a 67 year old Caucasian female who is in today for followup. Continues to struggle with persistent congestion, postnasal drip and irritation/pressure in her ears. Her cough has improved. She is established with pulmonology and awaiting an overnight sleep study. Continues to benefit from her CPAP he feels less tired when she uses it. No other acute illness just persistent fatigue and malaise. No fevers, chest pain or palpitations. No shortness of breath GI or GU concerns. Is taking medications as prescribed  Past Medical History  Diagnosis Date  . Obesity   . Esophageal reflux   . Fibromyalgia   . Asthma   . Allergy     rhinitis  . Sleep apnea, obstructive   . Bronchitis, acute 10/31/2010  . Otitis media of both ears 11/16/2010  . Depression with anxiety 01/08/2011  . Back pain 01/08/2011  . Sinusitis 06/11/2011  . Fatigue 06/11/2011  . Knee pain, right 09/20/2011  . Otitis media 09/20/2011  . Varicose veins of lower limb 10/02/2011  . Otitis media of left ear 09/19/2010  . Motion sickness 12/24/2011  . Sinusitis 12/24/2011  . Nausea & vomiting 12/24/2011  . Heart murmur   . Panic attacks   . Arthritis 02-01-12    osteoarthritis, Back pain, spine surgeries ? retain hardware cervial and  lumbar.  . Anemia 02-01-12    iron absorption problem  . Cancer 02-01-12    squamous cell -face    Past Surgical History  Procedure Laterality Date  . Bariatric surgery    . Lumbar disc surgery    . Bladder suspension      complicated by bowel nick/ clolostomy  . Gallbladder surgery  1978  . Appendectomy  1962  . Gastric bypass  2003    Patient also noted "gastric bypass resection - 2004"  . Cervical disc surgery      C-7 in  2004, C-4 and C-5 in 2010  . Colostomy bag      Confirm with patient. Listed under medical conditions on form dated 07/18/09.  [name]      Per medical history form dated 07/18/09.  [name] Kitchen Hernia repair  2009    Two hernias and abdominal reconstruction  . Ivc filter      prophyllactically- no hx DVT  . Carpal tunnel release  02-01-12    right  . Colostomy reversal  02-01-12  . Abdominal surgery  02-01-12    ABDOMINAL SURGERY  . Vulva surgery  02-01-12    cyst removal-pt 8 months pregnant  . Total knee arthroplasty  02/11/2012    Procedure: TOTAL KNEE ARTHROPLASTY;  Surgeon: [name], MD;  Location: [facility];  Service: Orthopedics;  Laterality: Right;    Family History  Problem Relation Age of Onset  . Alcohol abuse Mother   . Other Mother     accidental med overdose  . Emphysema Mother     smoked  . Bipolar disorder Mother   . Other Father     CHF  . Diabetes Father   . Cancer Father     throat ca/ smoked  . Alcohol abuse Father   . Stroke Father   . Hypertension Brother   .  Hyperlipidemia Brother   . Obesity Brother   . Heart attack Maternal Grandfather   . Heart attack Paternal Grandmother   . Heart attack Paternal Grandfather   . Arthritis Son     psoriatic  . Arthritis Son     psoriatic  . Obesity Son     History   Social History  . Marital Status: Married    Spouse Name: N/A    Number of Children: N/A  . Years of Education: N/A   Occupational History  . Not on file.   Social History Main Topics  . Smoking status: Never Smoker   . Smokeless tobacco: Never Used  . Alcohol Use: No     Comment: special occasions  . Drug Use: No  . Sexual Activity: Yes   Other Topics Concern  . Not on file   Social History Narrative  . No narrative on file    Current Outpatient Prescriptions on File Prior to Visit  Medication Sig Dispense Refill  . ALPRAZolam (XANAX) 0.25 MG tablet Take 0.25 mg by mouth daily as needed. For anxiety      . aspirin 81 MG  tablet Take 81 mg by mouth daily.      [name] Kitchen atorvastatin (LIPITOR) 10 MG tablet Take 10 mg by mouth daily.      [name] Kitchen azithromycin (ZITHROMAX) 250 MG tablet Take as directed      . benzonatate (TESSALON) 100 MG capsule Take 1 capsule (100 mg total) by mouth 2 (two) times daily as needed for cough.  20 capsule  0  . bifidobacterium infantis (ALIGN) capsule Take 1 capsule by mouth daily.       . calcitRIOL (ROCALTROL) 0.5 MCG capsule Take 0.5 mcg by mouth daily.       . clonazePAM (KLONOPIN) 1 MG tablet Take 1 mg by mouth at bedtime.       . DULoxetine (CYMBALTA) 60 MG capsule Take 60 mg by mouth every morning.       . Fluticasone-Salmeterol (ADVAIR DISKUS) 250-50 MCG/DOSE AEPB inhale 1 puff by mouth twice a day  180 each  1  . HYDROcodone-homatropine (HYCODAN) 5-1.5 MG/5ML syrup Take 5 mLs by mouth every 8 (eight) hours as needed for cough.  120 mL  0  . ipratropium (ATROVENT) 0.06 % nasal spray Place 2 sprays into the nose 2 (two) times daily.       [name] Kitchen lamoTRIgine (LAMICTAL) 100 MG tablet Take 50 mg by mouth at bedtime.       [name] Kitchen levocetirizine (XYZAL) 5 MG tablet Take 5 mg by mouth every morning.       . methocarbamol (ROBAXIN) 500 MG tablet Take 1 tablet (500 mg total) by mouth every 6 (six) hours as needed (muscle spasms).  50 tablet  0  . methylphenidate (RITALIN) 10 MG tablet Take 10 mg by mouth 2 (two) times daily.      . montelukast (SINGULAIR) 10 MG tablet Take 1 tablet (10 mg total) by mouth daily.  30 tablet  3  . NON FORMULARY once a week.       . NON FORMULARY CPAP- set on [employer].      . Olopatadine HCl (PATANASE) 0.6 % SOLN Place 2 sprays into the nose every morning.       [name] Kitchen oxybutynin (DITROPAN) 5 MG tablet Take 1 tablet (5 mg total) by mouth 2 (two) times daily.  60 tablet  2  . pantoprazole (PROTONIX) 40 MG tablet take 1 tablet by mouth at  bedtime  30 tablet  4  . ranitidine (ZANTAC) 150 MG tablet take 1 tablet by mouth twice a day ALTERNATE WITH PANTOPRAZOLE  60 tablet  3  . traZODone  (DESYREL) 50 MG tablet Take 50 mg by mouth at bedtime.        No current facility-administered medications on file prior to visit.    Allergies  Allergen Reactions  . Hydrocodone     Ask patient severity and reaction. Not indicated on medical history dated 07/18/09.02-01-12 nausea and vomiting and headaches.  . Cephalexin     Ask patient severity and reaction. Not indicated on medical history dated 07/18/09.? Was this or Levaquin that caused Neuropathy  . Codeine     Crazy in the head, bp drops  . Levofloxacin Itching and Other (See Comments)    Neuropathy   . Meperidine Hcl     B/P drops  . Morphine And Related     Ask patient severity and reaction. Not indicated on medical history dated 07/18/09. 02-01-12 States" extreme migraines"  . Oxycodone-Acetaminophen Other (See Comments)    Crazy in head, drop in bp  . Penicillins Nausea And Vomiting  . Sulfonamide Derivatives Nausea And Vomiting    Stomach upset  . Vicodin [Hydrocodone-Acetaminophen]     Ask patient severity and reaction. Not indicated on medical history dated 07/18/09.02-01-12 same as Hydrocodone  . Adhesive [Tape] Rash    Latex in adhesive tape. Please use paper tape    Review of Systems  Review of Systems  Constitutional: Negative for fever and malaise/fatigue.  HENT: Negative for congestion.   Eyes: Negative for discharge.  Respiratory: Negative for shortness of breath.   Cardiovascular: Negative for chest pain, palpitations and leg swelling.  Gastrointestinal: Negative for nausea, abdominal pain and diarrhea.  Genitourinary: Negative for dysuria.  Musculoskeletal: Negative for falls.  Skin: Negative for rash.  Neurological: Negative for loss of consciousness and headaches.  Endo/Heme/Allergies: Negative for polydipsia.  Psychiatric/Behavioral: Negative for depression and suicidal ideas. The patient is not nervous/anxious and does not have insomnia.     Objective  BP 122/84  Pulse 54  Temp(Src) 99.2 F (37.3  C) (Oral)  Ht 4\' 11"  (1.499 m)  Wt 205 lb (92.987 kg)  BMI 41.38 kg/m2  SpO2 94%  Physical Exam  Physical Exam  Constitutional: She is oriented to person, place, and time and well-developed, well-nourished, and in no distress. No distress.  HENT:  Head: Normocephalic and atraumatic.  Eyes: Conjunctivae are normal.  Neck: Neck supple. No thyromegaly present.  Cardiovascular: Normal rate, regular rhythm and normal heart sounds.   No murmur heard. Pulmonary/Chest: Effort normal and breath sounds normal. She has no wheezes.  Abdominal: She exhibits no distension and no mass.  Musculoskeletal: She exhibits no edema.  Lymphadenopathy:    She has no cervical adenopathy.  Neurological: She is alert and oriented to person, place, and time.  Skin: Skin is warm and dry. No rash noted. She is not diaphoretic.  Psychiatric: Memory, affect and judgment normal.    Lab Results  Component Value Date   TSH 2.199 11/28/2012   Lab Results  Component Value Date   WBC 6.5 11/28/2012   HGB 11.8* 11/28/2012   HCT 35.5* 11/28/2012   MCV 89.4 11/28/2012   PLT 278 11/28/2012   Lab Results  Component Value Date   CREATININE 0.83 11/28/2012   BUN 16 11/28/2012   NA 141 11/28/2012   K 4.1 11/28/2012   CL 106 11/28/2012  CO2 27 11/28/2012   Lab Results  Component Value Date   ALT 9 11/28/2012   AST 14 11/28/2012   ALKPHOS 91 11/28/2012   BILITOT 0.4 11/28/2012     Assessment & Plan2  SLEEP APNEA, OBSTRUCTIVE Patient is now established with [facility]. Despite a home sleep study which did not confirm need for sleep apnea but she continues to benefit from CPAP with better sleep and less fatigue. They are going to proceed with an extended in house sleep study to furtherclarify her disease.   DM Repeat hgba1c prior to next visit.  OBESITY Encouraged DASH diet and activity as tolerated.  ESOPHAGEAL REFLUX Avoid offending foods, encouraged daily probiotics continue current meds

## 2012-12-27 ENCOUNTER — Encounter: Payer: Self-pay | Admitting: Family Medicine

## 2012-12-27 NOTE — Assessment & Plan Note (Signed)
Encouraged DASH diet and activity as tolerated 

## 2012-12-27 NOTE — Assessment & Plan Note (Signed)
Repeat hgba1c prior to next visit.

## 2012-12-27 NOTE — Assessment & Plan Note (Signed)
Persistent symptoms patient very frustrated, referred to ENT

## 2012-12-27 NOTE — Assessment & Plan Note (Signed)
Patient is now established with [facility]. Despite a home sleep study which did not confirm need for sleep apnea but she continues to benefit from CPAP with better sleep and less fatigue. They are going to proceed with an extended in house sleep study to furtherclarify her disease.

## 2012-12-27 NOTE — Assessment & Plan Note (Signed)
Avoid offending foods, encouraged daily probiotics continue current meds

## 2012-12-29 ENCOUNTER — Ambulatory Visit: Payer: Medicare Other | Admitting: Family Medicine

## 2012-12-30 DIAGNOSIS — M7981 Nontraumatic hematoma of soft tissue: Secondary | ICD-10-CM | POA: Diagnosis not present

## 2012-12-30 DIAGNOSIS — I8310 Varicose veins of unspecified lower extremity with inflammation: Secondary | ICD-10-CM | POA: Diagnosis not present

## 2012-12-30 DIAGNOSIS — M79609 Pain in unspecified limb: Secondary | ICD-10-CM | POA: Diagnosis not present

## 2013-01-07 ENCOUNTER — Other Ambulatory Visit: Payer: Self-pay | Admitting: Family Medicine

## 2013-01-08 NOTE — Telephone Encounter (Signed)
Please check with patient and see if she thought this helped or she wants it back, it is OK torefill if she is interested in more.

## 2013-01-08 NOTE — Telephone Encounter (Signed)
Please advise? I don't see this on med list? 

## 2013-01-08 NOTE — Telephone Encounter (Signed)
Pt states she is no longer taking Ditropan due to it not being as effective.   Pt would like to go back on Vesicare.  RX sent to pharmacy

## 2013-01-08 NOTE — Telephone Encounter (Signed)
Is now taking Ditropan so would need to stop the Ditropan to take the Vesicare again

## 2013-01-13 NOTE — Assessment & Plan Note (Signed)
Okay to continue allergy vaccine 1:10 GO

## 2013-01-13 NOTE — Assessment & Plan Note (Addendum)
Symptoms and body habitus certainly make her high-risk for sleep apnea, but unattended study was negative. Need to consider primary sleepiness/ narcolepsy Plan-schedule Sleep Study- NPSG

## 2013-01-25 ENCOUNTER — Ambulatory Visit (HOSPITAL_BASED_OUTPATIENT_CLINIC_OR_DEPARTMENT_OTHER): Payer: Medicare Other | Attending: Internal Medicine

## 2013-01-25 VITALS — Ht 59.0 in | Wt 213.0 lb

## 2013-01-25 DIAGNOSIS — R0609 Other forms of dyspnea: Secondary | ICD-10-CM | POA: Insufficient documentation

## 2013-01-25 DIAGNOSIS — G4733 Obstructive sleep apnea (adult) (pediatric): Secondary | ICD-10-CM | POA: Diagnosis not present

## 2013-01-25 DIAGNOSIS — Z9989 Dependence on other enabling machines and devices: Secondary | ICD-10-CM

## 2013-01-25 DIAGNOSIS — R0989 Other specified symptoms and signs involving the circulatory and respiratory systems: Secondary | ICD-10-CM | POA: Insufficient documentation

## 2013-01-26 ENCOUNTER — Ambulatory Visit (HOSPITAL_BASED_OUTPATIENT_CLINIC_OR_DEPARTMENT_OTHER): Payer: Medicare Other | Attending: Internal Medicine | Admitting: Radiology

## 2013-01-26 VITALS — Ht 64.0 in | Wt 213.0 lb

## 2013-01-26 DIAGNOSIS — G47419 Narcolepsy without cataplexy: Secondary | ICD-10-CM

## 2013-01-26 DIAGNOSIS — G4733 Obstructive sleep apnea (adult) (pediatric): Secondary | ICD-10-CM | POA: Diagnosis not present

## 2013-01-26 DIAGNOSIS — G4710 Hypersomnia, unspecified: Secondary | ICD-10-CM | POA: Insufficient documentation

## 2013-01-26 DIAGNOSIS — Z79899 Other long term (current) drug therapy: Secondary | ICD-10-CM | POA: Insufficient documentation

## 2013-01-29 DIAGNOSIS — K1120 Sialoadenitis, unspecified: Secondary | ICD-10-CM | POA: Diagnosis not present

## 2013-01-31 DIAGNOSIS — G47419 Narcolepsy without cataplexy: Secondary | ICD-10-CM | POA: Diagnosis not present

## 2013-01-31 DIAGNOSIS — G4710 Hypersomnia, unspecified: Secondary | ICD-10-CM

## 2013-01-31 DIAGNOSIS — G4733 Obstructive sleep apnea (adult) (pediatric): Secondary | ICD-10-CM

## 2013-01-31 NOTE — Sleep Study (Signed)
   NAME: [name] DATE OF BIRTH:  September 15, 1945 MEDICAL RECORD NUMBER [id number]  LOCATION: [facility]  PHYSICIAN: YOUNG,CLINTON D  DATE OF STUDY: 01/25/2013  SLEEP STUDY TYPE: Nocturnal Polysomnogram               REFERRING PHYSICIAN: Baird Lyons D, MD  INDICATION FOR STUDY: Hypersomnia with sleep apnea  EPWORTH SLEEPINESS SCORE:   HEIGHT: 4\' 11"  (149.9 cm)  WEIGHT: 96.616 kg (213 lb)    Body mass index is 43 kg/(m^2).  NECK SIZE: 14 in.  MEDICATIONS: Charted for review  SLEEP ARCHITECTURE: Total sleep time 331.5 minutes with sleep efficiency 82.7%. Stage I was 12.7%, stage II 77.2%, stage III absent, REM 10.1% of total sleep time. Sleep latency 10 minutes, REM latency 212 minutes, awake after sleep onset 59.5 minutes, arousal index 34.8. Bedtime medication: Lipitor, Klonopin, Atrovent, Lamictal, Patanase  RESPIRATORY DATA: Apnea hypopnea index (AHI) 7.6 per hour. A total of 42 events was scored including 34 obstructive apneas and 8 hypopneas. All events were with supine sleep position. REM AHI 59.1 per hour. She did not qualify for split protocol CPAP.  OXYGEN DATA: Loud snoring with oxygen desaturation nadir of 80% and mean oxygen saturation through the study of 91.2% on room air.  CARDIAC DATA: Sinus rhythm  MOVEMENT/PARASOMNIA: No significant movement disturbance. Bathroom x1  IMPRESSION/ RECOMMENDATION:   1) Mild obstructive sleep apnea/hypopneas syndrome, AHI 7.6 per hour with supine events. REM AHI 59.1 per hour reflecting association of events mostly with REM sleep. Loud snoring with oxygen desaturation to a nadir of 80% and mean oxygen saturation through the study of 91.2% on room air. 2) There were not enough for early events to qualify for split protocol CPAP titration 3) See report of MSLT following this study   [place], American Board of Sleep Medicine  ELECTRONICALLY SIGNED ON:  01/31/2013, 3:53 PM [place] PH: [phone]   FX: [phone] [place]

## 2013-01-31 NOTE — Sleep Study (Signed)
    NAME: [name] DATE OF BIRTH:  [birth year] MEDICAL RECORD NUMBER [id number]  LOCATION: [facility]  PHYSICIAN: Monquie Fulgham D  DATE OF STUDY: 01/26/2013  SLEEP STUDY TYPE: Multiple Sleep Latency Test               REFERRING PHYSICIAN: Deneise Lever, MD  INDICATION FOR STUDY: Hypersomnia.   EPWORTH SLEEPINESS SCORE:   19/24 HEIGHT: 5\' 4"  (162.6 cm)  WEIGHT: 96.616 kg (213 lb)    Body mass index is 36.54 kg/(m^2).  NECK SIZE: 14 in.  MEDICATIONS: Charted for review. Note: The patient has not taken Ritalin in 2 weeks. Bedtime medication during polysomnogram the night before included Lipitor, Klonopin, Atrovent, Lamictal, Patanase. Medications taken on the day of the present study included Rocaltrol, Cymbalta, Atrovent nasal spray, Xyzal. Interpretation recognizes that several of these medications may affect daytime sleep for this study.  NAP 1: 8 AM, sleep latency 1 minute, REM latency NA  NAP 2: 10 AM sleep latency 0, REM latency NA  NAP 3: 12 noon sleep latency 2 minutes, REM latency NA  NAP 4: 1400 p.m. sleep latency 1 minute, REM latency NA  NAP 5: 1600 p.m. sleep latency 0.5 minutes, REM latency 1 minute  MEAN SLEEP LATENCY: 0.9 minutes  NUMBER OF REM EPISODES:  1/5  naps  COMMENTS:   1) This study follows a nocturnal polysomnogram on the preceding night which showed total sleep time 331.5 minutes with 10.1% REM, AHI 7.6 per hour. The patient has been using CPAP at home based on a prior study, but does not use it for the polysomnogram or for the present sleep latency test.. 2) Bedtime medication the night before included Klonopin and Lamictal. Medication in the morning of the sleep latency test included Cymbalta and Xyzal. These medications are likely to affect sleep, including REM. They would not be predicted to produce the severe daytime hypersomnia demonstrated on this MSLT, but might have reduced the amount of REM.  IMPRESSION/  RECOMMENDATION:   1) Severe daytime hypersomnia, pathologic daytime sleepiness, with mean sleep latency 0.9 minutes and REM sleep on one of 5 naps. 2) This pattern is nonspecific because of the medications, but is considered not likely to be simple medication sedation. A disorder of primary hypersomnia including narcolepsy or idiopathic hypersomnia is considered a better explanation.  3) Mild obstructive sleep apnea was demonstrated on the polysomnogram on 01/25/2013 with an AHI of 7.6 per hour. Depending on  clinical assessment, resumption of her CPAP at night and an alerting medication such as Ritalin during the day are likely to be appropriate interventions.  Signed Baird Lyons M.D. Deneise Lever Diplomate, American Board of Sleep Medicine  ELECTRONICALLY SIGNED ON:  01/31/2013, 4:04 PM [place] PH: [phone]   FX: [phone] [place]

## 2013-02-02 ENCOUNTER — Ambulatory Visit (INDEPENDENT_AMBULATORY_CARE_PROVIDER_SITE_OTHER): Payer: Medicare Other | Admitting: Family

## 2013-02-02 ENCOUNTER — Encounter: Payer: Self-pay | Admitting: Family

## 2013-02-02 ENCOUNTER — Telehealth: Payer: Self-pay | Admitting: *Deleted

## 2013-02-02 ENCOUNTER — Ambulatory Visit (HOSPITAL_BASED_OUTPATIENT_CLINIC_OR_DEPARTMENT_OTHER)
Admission: RE | Admit: 2013-02-02 | Discharge: 2013-02-02 | Disposition: A | Discharge status: Home / Self Care | Payer: Medicare Other | Source: Ambulatory Visit | Attending: Family | Admitting: Family

## 2013-02-02 VITALS — BP 110/80 | HR 64 | Temp 99.4°F | Resp 20 | Ht 59.0 in | Wt 206.1 lb

## 2013-02-02 DIAGNOSIS — J209 Acute bronchitis, unspecified: Secondary | ICD-10-CM | POA: Diagnosis not present

## 2013-02-02 DIAGNOSIS — R509 Fever, unspecified: Secondary | ICD-10-CM | POA: Diagnosis not present

## 2013-02-02 DIAGNOSIS — R059 Cough, unspecified: Secondary | ICD-10-CM | POA: Insufficient documentation

## 2013-02-02 DIAGNOSIS — E785 Hyperlipidemia, unspecified: Secondary | ICD-10-CM

## 2013-02-02 DIAGNOSIS — D509 Iron deficiency anemia, unspecified: Secondary | ICD-10-CM

## 2013-02-02 DIAGNOSIS — R062 Wheezing: Secondary | ICD-10-CM

## 2013-02-02 DIAGNOSIS — E119 Type 2 diabetes mellitus without complications: Secondary | ICD-10-CM

## 2013-02-02 DIAGNOSIS — R05 Cough: Secondary | ICD-10-CM | POA: Insufficient documentation

## 2013-02-02 LAB — POCT INFLUENZA A/B
Influenza A, POC: NEGATIVE
Influenza B, POC: NEGATIVE

## 2013-02-02 MED ORDER — AZITHROMYCIN 250 MG PO TABS: ORAL_TABLET | ORAL | Status: DC

## 2013-02-02 MED ORDER — ALBUTEROL SULFATE HFA 108 (90 BASE) MCG/ACT IN AERS: 2.0000 | INHALATION_SPRAY | Freq: Four times a day (QID) | RESPIRATORY_TRACT | Status: DC | PRN

## 2013-02-02 MED ORDER — ALBUTEROL SULFATE (2.5 MG/3ML) 0.083% IN NEBU
2.5000 mg | INHALATION_SOLUTION | Freq: Once | RESPIRATORY_TRACT | Status: AC
  Administered 2013-02-02: 2.5 mg via RESPIRATORY_TRACT

## 2013-02-02 MED ORDER — PREDNISONE 10 MG PO TABS: ORAL_TABLET | ORAL | Status: DC

## 2013-02-02 NOTE — Progress Notes (Signed)
Subjective:    Patient ID: [name], female    DOB: July 23, 1945, 67 y.o.   MRN: [id number]  HPI  [name] is a 68 yr old female who presents today with chief complaint of cough. She reports mild cough Friday, worsened over the weekend.  Low grade temp.  Had some tussionex at night which helped her to sleep through the night.  Using tessalon perles through the day which has helped some. Reports some associated wheezing.  She has + hx of asthma.   Review of Systems See HPI  Past Medical History  Diagnosis Date  . Obesity   . Esophageal reflux   . Fibromyalgia   . Asthma   . Allergy     rhinitis  . Sleep apnea, obstructive   . Bronchitis, acute 10/31/2010  . Otitis media of both ears 11/16/2010  . Depression with anxiety 01/08/2011  . Back pain 01/08/2011  . Sinusitis 06/11/2011  . Fatigue 06/11/2011  . Knee pain, right 09/20/2011  . Otitis media 09/20/2011  . Varicose veins of lower limb 10/02/2011  . Otitis media of left ear 09/19/2010  . Motion sickness 12/24/2011  . Sinusitis 12/24/2011  . Nausea & vomiting 12/24/2011  . Heart murmur   . Panic attacks   . Arthritis 02-01-12    osteoarthritis, Back pain, spine surgeries ? retain hardware cervial and  lumbar.  . Anemia 02-01-12    iron absorption problem  . Cancer 02-01-12    squamous cell -face  . Allergic state 09/21/2010    History   Social History  . Marital Status: Married    Spouse Name: N/A    Number of Children: N/A  . Years of Education: N/A   Occupational History  . Not on file.   Social History Main Topics  . Smoking status: Never Smoker   . Smokeless tobacco: Never Used  . Alcohol Use: No     Comment: special occasions  . Drug Use: No  . Sexual Activity: Yes   Other Topics Concern  . Not on file   Social History Narrative  . No narrative on file    Past Surgical History  Procedure Laterality Date  . Bariatric surgery    . Lumbar disc surgery    . Bladder suspension      complicated by  bowel nick/ clolostomy  . Gallbladder surgery  1978  . Appendectomy  1962  . Gastric bypass  2003    Patient also noted "gastric bypass resection - 2004"  . Cervical disc surgery      C-7 in 2004, C-4 and C-5 in 2010  . Colostomy bag      Confirm with patient. Listed under medical conditions on form dated 07/18/09.  [name]      Per medical history form dated 07/18/09.  [name] Kitchen Hernia repair  2009    Two hernias and abdominal reconstruction  . Ivc filter      prophyllactically- no hx DVT  . Carpal tunnel release  02-01-12    right  . Colostomy reversal  02-01-12  . Abdominal surgery  02-01-12    ABDOMINAL SURGERY  . Vulva surgery  02-01-12    cyst removal-pt 8 months pregnant  . Total knee arthroplasty  02/11/2012    Procedure: TOTAL KNEE ARTHROPLASTY;  Surgeon: Mauri Pole, MD;  Location: [facility];  Service: Orthopedics;  Laterality: Right;    Family History  Problem Relation Age of Onset  . Alcohol abuse Mother   .  Other Mother     accidental med overdose  . Emphysema Mother     smoked  . Bipolar disorder Mother   . Other Father     CHF  . Diabetes Father   . Cancer Father     throat ca/ smoked  . Alcohol abuse Father   . Stroke Father   . Hypertension Brother   . Hyperlipidemia Brother   . Obesity Brother   . Heart attack Maternal Grandfather   . Heart attack Paternal Grandmother   . Heart attack Paternal Grandfather   . Arthritis Son     psoriatic  . Arthritis Son     psoriatic  . Obesity Son     Allergies  Allergen Reactions  . Hydrocodone     Ask patient severity and reaction. Not indicated on medical history dated 07/18/09.02-01-12 nausea and vomiting and headaches.  . Cephalexin     Ask patient severity and reaction. Not indicated on medical history dated 07/18/09.? Was this or Levaquin that caused Neuropathy  . Codeine     Crazy in the head, bp drops  . Levofloxacin Itching and Other (See Comments)    Neuropathy   . Meperidine Hcl     B/P drops  .  Morphine And Related     Ask patient severity and reaction. Not indicated on medical history dated 07/18/09. 02-01-12 States" extreme migraines"  . Oxycodone-Acetaminophen Other (See Comments)    Crazy in head, drop in bp  . Penicillins Nausea And Vomiting  . Sulfonamide Derivatives Nausea And Vomiting    Stomach upset  . Vicodin [Hydrocodone-Acetaminophen]     Ask patient severity and reaction. Not indicated on medical history dated 07/18/09.02-01-12 same as Hydrocodone  . Adhesive [Tape] Rash    Latex in adhesive tape. Please use paper tape    Current Outpatient Prescriptions on File Prior to Visit  Medication Sig Dispense Refill  . ALPRAZolam (XANAX) 0.25 MG tablet Take 0.25 mg by mouth daily as needed. For anxiety      . aspirin 81 MG tablet Take 81 mg by mouth daily.      [name] Kitchen atorvastatin (LIPITOR) 10 MG tablet Take 10 mg by mouth daily.      . benzonatate (TESSALON) 100 MG capsule Take 1 capsule (100 mg total) by mouth 2 (two) times daily as needed for cough.  20 capsule  0  . bifidobacterium infantis (ALIGN) capsule Take 1 capsule by mouth daily.       . calcitRIOL (ROCALTROL) 0.5 MCG capsule Take 0.5 mcg by mouth daily.       . clonazePAM (KLONOPIN) 1 MG tablet Take 1 mg by mouth at bedtime.       . DULoxetine (CYMBALTA) 60 MG capsule Take 60 mg by mouth every morning.       . Fluticasone-Salmeterol (ADVAIR DISKUS) 250-50 MCG/DOSE AEPB inhale 1 puff by mouth twice a day  180 each  1  . ipratropium (ATROVENT) 0.06 % nasal spray Place 2 sprays into the nose 2 (two) times daily.       [name] Kitchen lamoTRIgine (LAMICTAL) 100 MG tablet Take 50 mg by mouth at bedtime.       [name] Kitchen levocetirizine (XYZAL) 5 MG tablet Take 5 mg by mouth every morning.       . methocarbamol (ROBAXIN) 500 MG tablet Take 1 tablet (500 mg total) by mouth every 6 (six) hours as needed (muscle spasms).  50 tablet  0  . methylphenidate (RITALIN) 10 MG tablet Take 10  mg by mouth 2 (two) times daily.      . montelukast (SINGULAIR) 10  MG tablet Take 1 tablet (10 mg total) by mouth daily.  30 tablet  3  . NON FORMULARY once a week.       . NON FORMULARY CPAP- set on [facility] [employer].      . Olopatadine HCl (PATANASE) 0.6 % SOLN Place 2 sprays into the nose every morning.       . pantoprazole (PROTONIX) 40 MG tablet take 1 tablet by mouth at bedtime  30 tablet  4  . ranitidine (ZANTAC) 150 MG tablet take 1 tablet by mouth twice a day ALTERNATE WITH PANTOPRAZOLE  60 tablet  3  . solifenacin (VESICARE) 10 MG tablet Take 10 mg by mouth daily.      . traZODone (DESYREL) 50 MG tablet Take 50 mg by mouth at bedtime.       . VESICARE 10 MG tablet take 1 tablet by mouth once daily  30 tablet  3   No current facility-administered medications on file prior to visit.    BP 110/80  Pulse 64  Temp(Src) 99.4 F (37.4 C) (Oral)  Resp 20  Ht 4\' 11"  (1.499 m)  Wt 206 lb 1.9 oz (93.495 kg)  BMI 41.61 kg/m2  SpO2 96%       Objective:   Physical Exam  Constitutional: She is oriented to person, place, and time. She appears well-developed and well-nourished. No distress.  HENT:  Head: Normocephalic and atraumatic.  Cardiovascular: Normal rate.   No murmur heard. Pulmonary/Chest: Effort normal. No respiratory distress. She has wheezes. She has no rales. She exhibits no tenderness.  Musculoskeletal: She exhibits no edema.  Lymphadenopathy:    She has no cervical adenopathy.  Neurological: She is alert and oriented to person, place, and time.  Psychiatric: She has a normal mood and affect. Her behavior is normal. Judgment and thought content normal.          Assessment & Plan:

## 2013-02-02 NOTE — Assessment & Plan Note (Signed)
Start prednisone.  Use albuterol every 4-6 hours for the next few days. Start Z pak.  Complete chest x ray on the first floor.   Call if symptoms worsen or if not resolved in 1 week. Keep follow up tomorrow with Dr. Annamaria Boots.

## 2013-02-02 NOTE — Patient Instructions (Signed)
Start prednisone.  Use albuterol every 4-6 hours for the next few days. Start Z pak.  Complete your chest x ray on the first floor.  Please call if symptoms worsen or if not resolved in 1 week. Keep follow up tomorrow with Dr. Annamaria Boots.

## 2013-02-02 NOTE — Telephone Encounter (Signed)
Lab orders entered, forwarded to lab/SLS

## 2013-02-02 NOTE — Progress Notes (Signed)
Pre visit review using our clinic review tool, if applicable. No additional management support is needed unless otherwise documented below in the visit note. 

## 2013-02-03 ENCOUNTER — Other Ambulatory Visit: Payer: Medicare Other

## 2013-02-03 ENCOUNTER — Ambulatory Visit (INDEPENDENT_AMBULATORY_CARE_PROVIDER_SITE_OTHER): Payer: Medicare Other | Admitting: Internal Medicine

## 2013-02-03 ENCOUNTER — Encounter: Payer: Self-pay | Admitting: Internal Medicine

## 2013-02-03 VITALS — BP 136/74 | HR 82 | Ht 59.0 in | Wt 206.2 lb

## 2013-02-03 DIAGNOSIS — G47419 Narcolepsy without cataplexy: Secondary | ICD-10-CM | POA: Diagnosis not present

## 2013-02-03 DIAGNOSIS — G4733 Obstructive sleep apnea (adult) (pediatric): Secondary | ICD-10-CM | POA: Diagnosis not present

## 2013-02-03 DIAGNOSIS — D509 Iron deficiency anemia, unspecified: Secondary | ICD-10-CM | POA: Diagnosis not present

## 2013-02-03 DIAGNOSIS — J209 Acute bronchitis, unspecified: Secondary | ICD-10-CM | POA: Diagnosis not present

## 2013-02-03 DIAGNOSIS — E785 Hyperlipidemia, unspecified: Secondary | ICD-10-CM | POA: Diagnosis not present

## 2013-02-03 DIAGNOSIS — E119 Type 2 diabetes mellitus without complications: Secondary | ICD-10-CM | POA: Diagnosis not present

## 2013-02-03 LAB — CBC WITH DIFFERENTIAL/PLATELET
BASOS PCT: 1 % (ref 0–1)
Basophils Absolute: 0 10*3/uL (ref 0.0–0.1)
Eosinophils Absolute: 0.4 10*3/uL (ref 0.0–0.7)
Eosinophils Relative: 8 % — ABNORMAL HIGH (ref 0–5)
HEMATOCRIT: 36.8 % (ref 36.0–46.0)
Hemoglobin: 12.4 g/dL (ref 12.0–15.0)
Lymphocytes Relative: 23 % (ref 12–46)
Lymphs Abs: 1.3 10*3/uL (ref 0.7–4.0)
MCH: 29.7 pg (ref 26.0–34.0)
MCHC: 33.7 g/dL (ref 30.0–36.0)
MCV: 88.2 fL (ref 78.0–100.0)
MONOS PCT: 12 % (ref 3–12)
Monocytes Absolute: 0.7 10*3/uL (ref 0.1–1.0)
NEUTROS ABS: 3.2 10*3/uL (ref 1.7–7.7)
NEUTROS PCT: 56 % (ref 43–77)
Platelets: 205 10*3/uL (ref 150–400)
RBC: 4.17 MIL/uL (ref 3.87–5.11)
RDW: 14.2 % (ref 11.5–15.5)
WBC: 5.7 10*3/uL (ref 4.0–10.5)

## 2013-02-03 LAB — LIPID PANEL
CHOL/HDL RATIO: 2.4 ratio
CHOLESTEROL: 121 mg/dL (ref 0–200)
HDL: 51 mg/dL (ref >39)
LDL Cholesterol: 55 mg/dL (ref 0–99)
TRIGLYCERIDES: 76 mg/dL (ref <150)
VLDL: 15 mg/dL (ref 0–40)

## 2013-02-03 LAB — HEPATIC FUNCTION PANEL
ALBUMIN: 4.2 g/dL (ref 3.5–5.2)
ALT: 11 U/L (ref 0–35)
AST: 15 U/L (ref 0–37)
Alkaline Phosphatase: 105 U/L (ref 39–117)
Bilirubin, Direct: 0.2 mg/dL (ref 0.0–0.3)
Indirect Bilirubin: 0.6 mg/dL (ref 0.0–0.9)
TOTAL PROTEIN: 6.3 g/dL (ref 6.0–8.3)
Total Bilirubin: 0.8 mg/dL (ref 0.3–1.2)

## 2013-02-03 MED ORDER — LEVALBUTEROL HCL 0.63 MG/3ML IN NEBU
0.6300 mg | INHALATION_SOLUTION | Freq: Once | RESPIRATORY_TRACT | Status: AC
  Administered 2013-02-03: 0.63 mg via RESPIRATORY_TRACT

## 2013-02-03 MED ORDER — METHYLPREDNISOLONE ACETATE 80 MG/ML IJ SUSP
80.0000 mg | Freq: Once | INTRAMUSCULAR | Status: AC
  Administered 2013-02-03: 80 mg via INTRAMUSCULAR

## 2013-02-03 NOTE — Patient Instructions (Signed)
Order- DME [facility]- needs replacement CPAP supplies. Ok to continue 13 cwp, mask of choice, supplies, humidifier    Dx OSA  We can continue Ritalin as needed  Order- lab- Narcolepsy HLA DR2 panel    Dx narcolepsy  Neb xop 0.63  Depo 80

## 2013-02-03 NOTE — Progress Notes (Signed)
Subjective:    Patient ID: [name], female    DOB: 19-Jul-1945, 68 y.o.   MRN: [id number]  HPI 09/21/10- 68 year old female never smoker followed for allergic rhinitis, asthma, OSA, complicated by GERD, DM, HBP, obesity. Last here August 08, 2009 CPAP 12- Able to wear it all night, comfortable now. . Colostomy (colon nicked in a bladder sling procedure) was reversed. Had to revise the wound for staph and incidentally had a squamous cell skin cancer removed. Had iron infusion for anemia. No major respiratory problems through the past year. For the past 2-3 weeks has had burning and tearing of eyes with some postnasal drip, raspy throat.  Daily xyzal, patanase, singulair and ipratropium eye nasal spray..  Not aware of drafts into eye. Tried optivar, visine. Denies glaucoma.  08/02/11- 68 year old female never smoker followed for allergic rhinitis, asthma, OSA, complicated by GERD, DM, HBP, obesity. Still on vaccine, cough -productive-light yellow in color(finished Zpak today; both ears painful(full),and nasal drainage. Still using CPAP every night  and will get with DME for mask options. Bronchitis x5 days with dark yellow sputum. Color clearing with Z-Pak. Admits wheezing and frontal sinus pressure without headache, fever, sore throat or grossly purulent sputum.  11/07/12- 68 year old female never smoker followed for allergic rhinitis, asthma, OSA, complicated by GERD, DM, HBP, obesity. FOLLOWS FOR: still on allergy vaccine and no reactions; cough x 6 weeks(has had 2 rds of abx and prednisone)Finished the prednisone monday. NPSG 03/23/1999- AHI 156/ hr, weight 202lbs before bariatric surgery, CPAP recently 12. [facility] screen 07/24/12- AHI 2.0/ hr, weight 209lbs.  Bronchitis/sinusitis responded in July to a Z-Pak and prednisone from her primary physician. She then relapsed and a second round of Z-Pak and prednisone ended last week. Persistent dry hacking cough increased by  activity and temperature changes. She is using Advair Singulair and Tessalon. She says she can tolerate Tussionex but not other hydrocodone products.  12/25/12-68 year old female never smoker followed for allergic rhinitis, asthma, OSA, complicated by GERD, DM, HBP, obesity. Allergy vaccine 1:10 GO FOLLOWS FOR: still on vaccine and doing well; discuss ordering full NPSG Cough is finally better. Blames postnasal drip despite Patanase and ipratropium. No sinus pressure. Taking clonazepam 1 mg at bedtime for leg jerks. Ritalin only if can't stay awake at work. Always tired and thinks her CPAP is not working. Interested in update full sleep study to reevaluate. CXR 11/07/12 IMPRESSION:  No acute cardiopulmonary process. Left midlung atelectasis and or  scarring.  Electronically Signed  By: Lovey Newcomer M.D.  On: 11/07/2012 17:12  02/03/13-68 year old female never smoker followed for allergic rhinitis, asthma, OSA, complicated by GERD, DM, HBP, obesity. Allergy vaccine 1:10 GO FOLLOWS FOR: review sleep study and MSLT with patient; patient is having a cough since Friday-was tested for flu-neg. Acute bronchitis- Dr Randel Pigg yesterday gave neb, albuterol inhaler, Zpak. Prednisone taper was not started. OSA- has continued CPAP 13/[facility]. NPSG 01/25/13- AHI 7.6/ hr, mild OSA, weight 213 lbs. MSLT 01/26/13- mean latency 0.9 minutes, SOREM 1/5 naps. Pathologic daytime sleepiness consistent with narcolepsy or idiopathic, but nonspecific because she had taken Klonopin and Lamictal the night before, Cymbalta and Xyzal the day of the test. CXR 02/02/13 IMPRESSION:  Findings suggestive of bronchitis. No focal infiltrate to suggest  pneumonia identified.  Electronically Signed  By: Jeannine Boga M.D.  On: 02/02/2013 17:52   Review of Systems-see HPI  Constitutional:   No-   weight loss, night sweats, fevers, chills, +fatigue, lassitude. HEENT:   No-  headaches,  difficulty swallowing, tooth/dental  problems, sore throat,       No-  sneezing, itching, ear ache, + nasal congestion, +post nasal drip,  CV:  No-   chest pain, orthopnea, PND, swelling in lower extremities, anasarca, dizziness, palpitations Resp: No-   shortness of breath with exertion or at rest.              No- productive cough,  + non-productive cough,  No-  coughing up of blood.              No-   change in color of mucus.  No- wheezing.   Skin: No-   rash or lesions. GI:  No-   heartburn, indigestion, abdominal pain, nausea, vomiting, GU:  MS:  No-   joint pain or swelling.   Neuro- nothing unusual Psych:  No- change in mood or affect. No depression or anxiety.  No memory loss.    Objective:   Physical Exam General- Alert, Oriented, Affect-appropriate, Distress- none acute  Very overweight Skin- rash-none, lesions- none, excoriation- none Lymphadenopathy- none Head- atraumatic            Eyes- Gross vision intact, PERRLA, conjunctivae clear, with watery thin secretions            Ears- Hearing, canals normal            Nose- Clear, No- Septal dev, mucus, polyps, erosion, perforation,             Throat- Mallampati II , mucosa clear , drainage- none, tonsils- atrophic Neck- flexible , trachea midline, no stridor , thyroid nl, carotid no bruit Chest - symmetrical excursion , unlabored           Heart/CV- RRR , no murmur , no gallop  , no rub, nl s1 s2                           - JVD- none , edema- none, stasis changes- none, varices- right calf           Lung-  Clear chest, Cough+light, wheeze- none , dullness-none, rub- none           Chest wall-  Abd-  Br/ Gen/ Rectal- Not done, not indicated Extrem- cyanosis- none, clubbing, none, atrophy- none, strength- nl Neuro- grossly intact to observation  Assessment & Plan:

## 2013-02-04 LAB — HEMOGLOBIN A1C
Hgb A1c MFr Bld: 5.6 % (ref <5.7)
MEAN PLASMA GLUCOSE: 114 mg/dL (ref <117)

## 2013-02-08 ENCOUNTER — Encounter: Payer: Self-pay | Admitting: Internal Medicine

## 2013-02-08 DIAGNOSIS — G47419 Narcolepsy without cataplexy: Secondary | ICD-10-CM | POA: Insufficient documentation

## 2013-02-08 NOTE — Assessment & Plan Note (Signed)
NPSG 01/25/13- AHI 7.6/ hr, mild OSA, weight 213 lbs.  MSLT 01/26/13- mean latency 0.9 minutes, SOREM 1/5 naps. Pathologic daytime sleepiness consistent with narcolepsy or idiopathic, but nonspecific because she had taken Klonopin and Lamictal the night before, Cymbalta and Xyzal the day of the test Plan-she'll managed as narcolepsy while also keeping control of her mild sleep apnea. Plan-sleep hygiene emphasized. Medication options reviewed. Lab for HLA DR2

## 2013-02-08 NOTE — Assessment & Plan Note (Addendum)
NPSG 01/25/13- AHI 7.6/ hr, mild OSA, weight 213 lbs.  MSLT 01/26/13- mean latency 0.9 minutes, SOREM 1/5 naps. Pathologic daytime sleepiness consistent with narcolepsy or idiopathic, but nonspecific because she had taken Klonopin and Lamictal the night before, Cymbalta and Xyzal the day of the test Plan-encouraged weight loss. Continues CPAP 13/ [facility]

## 2013-02-12 ENCOUNTER — Ambulatory Visit (INDEPENDENT_AMBULATORY_CARE_PROVIDER_SITE_OTHER): Payer: Medicare Other | Admitting: Family Medicine

## 2013-02-12 ENCOUNTER — Encounter: Payer: Self-pay | Admitting: Family Medicine

## 2013-02-12 ENCOUNTER — Telehealth: Payer: Self-pay | Admitting: Family Medicine

## 2013-02-12 VITALS — BP 112/72 | HR 58 | Temp 98.6°F | Ht 59.0 in | Wt 203.1 lb

## 2013-02-12 DIAGNOSIS — E119 Type 2 diabetes mellitus without complications: Secondary | ICD-10-CM

## 2013-02-12 DIAGNOSIS — I10 Essential (primary) hypertension: Secondary | ICD-10-CM

## 2013-02-12 DIAGNOSIS — E785 Hyperlipidemia, unspecified: Secondary | ICD-10-CM

## 2013-02-12 DIAGNOSIS — J209 Acute bronchitis, unspecified: Secondary | ICD-10-CM

## 2013-02-12 DIAGNOSIS — G4733 Obstructive sleep apnea (adult) (pediatric): Secondary | ICD-10-CM

## 2013-02-12 DIAGNOSIS — E212 Other hyperparathyroidism: Secondary | ICD-10-CM

## 2013-02-12 DIAGNOSIS — E559 Vitamin D deficiency, unspecified: Secondary | ICD-10-CM

## 2013-02-12 NOTE — Telephone Encounter (Signed)
Labs ordered.

## 2013-02-12 NOTE — Telephone Encounter (Signed)
Need Labs prior lipid, renal, cbc, tsh, hgba1c, hepatic Prior to appt on 05/14/13

## 2013-02-12 NOTE — Patient Instructions (Signed)
Curcumin caps daily (Turmeric)  Basic Carbohydrate Counting Basic carbohydrate counting is a way to plan meals. It is done by counting the amount of carbohydrate in foods. Foods that have carbohydrates are starches (grains, beans, starchy vegetables) and sweets. Eating carbohydrates increases blood glucose (sugar) levels. People with diabetes use carbohydrate counting to help keep their blood glucose at a normal level.  COUNTING CARBOHYDRATES IN FOODS The first step in counting carbohydrates is to learn how many carbohydrate servings you should have in every meal. A dietitian can plan this for you. After learning the amount of carbohydrates to include in your meal plan, you can start to choose the carbohydrate-containing foods you want to eat.  There are 2 ways to identify the amount of carbohydrates in the foods you eat.  Read the Nutrition Facts panel on food labels. You need 2 pieces of information from the Nutrition Facts panel to count carbohydrates this way:  Serving size.  Total carbohydrate (in grams). Decide how many servings you will be eating. If it is 1 serving, you will be eating the amount of carbohydrate listed on the panel. If you will be eating 2 servings, you will be eating double the amount of carbohydrate listed on the panel.   Learn serving sizes. A serving size of most carbohydrate-containing foods is about 15 grams (g). Listed below are single serving sizes of common carbohydrate-containing foods:  1 slice bread.   cup unsweetened, dry cereal.   cup hot cereal.   cup rice.   cup mashed potatoes.   cup pasta.  1 cup fresh fruit.   cup canned fruit.  1 cup milk (whole, 2%, or skim).   cup starchy vegetables (peas, corn, or potatoes). Counting carbohydrates this way is similar to looking on the Nutrition Facts panel. Decide how many servings you will eat first. Multiply the number of servings you eat by 15 g. For example, if you have 2 cups of  strawberries, you had 2 servings. That means you had 30 g of carbohydrate (2 servings x 15 g = 30 g). CALCULATING CARBOHYDRATES IN A MEAL Sample dinner  3 oz chicken breast.   cup brown rice.   cup corn.  1 cup fat-free milk.  1 cup strawberries with sugar-free whipped topping. Carbohydrate calculation First, identify the foods that contain carbohydrate:  Rice.  Corn.  Milk.  Strawberries. Calculate the number of servings eaten:  2 servings rice.  1 serving corn.  1 serving milk.  1 serving strawberries. Multiply the number of servings by 15 g:  2 servings rice x 15 g = 30 g.  1 serving corn x 15 g = 15 g.  1 serving milk x 15 g = 15 g.  1 serving strawberries x 15 g = 15 g. Add the amounts to find the total carbohydrates eaten: 30 g + 15 g + 15 g + 15 g = 75 g carbohydrate eaten at dinner. Document Released: 01/08/2005 Document Revised: 04/02/2011 Document Reviewed: 11/24/2010 [facility] Patient Information 2014 [place], [place].

## 2013-02-12 NOTE — Progress Notes (Signed)
Pre visit review using our clinic review tool, if applicable. No additional management support is needed unless otherwise documented below in the visit note. 

## 2013-02-13 ENCOUNTER — Telehealth: Payer: Self-pay

## 2013-02-13 ENCOUNTER — Telehealth: Payer: Self-pay | Admitting: Family Medicine

## 2013-02-13 NOTE — Telephone Encounter (Signed)
Relevant patient education assigned to patient using Emmi. ° °

## 2013-02-16 ENCOUNTER — Encounter: Payer: Self-pay | Admitting: Family Medicine

## 2013-02-16 NOTE — Assessment & Plan Note (Signed)
hgba1c 5.6 avoid simple carbs, continue current meds

## 2013-02-16 NOTE — Assessment & Plan Note (Signed)
Well controlled, tolerating Atorvastatin. Avoid trans fats

## 2013-02-16 NOTE — Assessment & Plan Note (Signed)
Is following with pulmonology and they are treating with CPAP has been diagnosed wut Narcolepsy clinically. Will continue with pulmonology.

## 2013-02-16 NOTE — Assessment & Plan Note (Signed)
Did not tolerate Prednisone, does feel she is improving s/p Zpak, encouraged probiotics and increased rest and hydratioin.

## 2013-02-16 NOTE — Assessment & Plan Note (Signed)
Well controlled, no changes 

## 2013-02-16 NOTE — Progress Notes (Signed)
Patient ID: [name], female   DOB: July 02, 1945, 68 y.o.   MRN: [id number] BRAYLI KLINGBEIL [id number] 1945/10/27 02/16/2013      Progress Note-Follow Up  Subjective  Chief Complaint  Chief Complaint  Patient presents with  . Follow-up    7 week    HPI  Patient is a 68 year old Caucasian female who is in today for followup. She is recovering from a recent bronchitis. Did not tolerate prednisone well it caused flushing but does believe azithromycin helped. Have some congestion but no fevers at this point. Only a mild cough. No chest pain, palpitations or shortness of breath. No GI or GU complaints. Taking medications as prescribed. Following closely pulmonology. He is clinically been diagnosed with narcolepsy and continues to struggle with significant fatigue. Is using her CPAP. Depression and myalgias persist but are not worsening.  Past Medical History  Diagnosis Date  . Obesity   . Esophageal reflux   . Fibromyalgia   . Asthma   . Allergy     rhinitis  . Sleep apnea, obstructive   . Bronchitis, acute 10/31/2010  . Otitis media of both ears 11/16/2010  . Depression with anxiety 01/08/2011  . Back pain 01/08/2011  . Sinusitis 06/11/2011  . Fatigue 06/11/2011  . Knee pain, right 09/20/2011  . Otitis media 09/20/2011  . Varicose veins of lower limb 10/02/2011  . Otitis media of left ear 09/19/2010  . Motion sickness 12/24/2011  . Sinusitis 12/24/2011  . Nausea & vomiting 12/24/2011  . Heart murmur   . Panic attacks   . Arthritis 02-01-12    osteoarthritis, Back pain, spine surgeries ? retain hardware cervial and  lumbar.  . Anemia 02-01-12    iron absorption problem  . Cancer 02-01-12    squamous cell -face  . Allergic state 09/21/2010    Past Surgical History  Procedure Laterality Date  . Bariatric surgery    . Lumbar disc surgery    . Bladder suspension      complicated by bowel nick/ clolostomy  . Gallbladder surgery  1978  . Appendectomy  1962  . Gastric bypass  2003     Patient also noted "gastric bypass resection - 2004"  . Cervical disc surgery      C-7 in 2004, C-4 and C-5 in 2010  . Colostomy bag      Confirm with patient. Listed under medical conditions on form dated 07/18/09.  [name]      Per medical history form dated 07/18/09.  [name] Hernia repair  2009    Two hernias and abdominal reconstruction  . Ivc filter      prophyllactically- no hx DVT  . Carpal tunnel release  02-01-12    right  . Colostomy reversal  02-01-12  . Abdominal surgery  02-01-12    ABDOMINAL SURGERY  . Vulva surgery  02-01-12    cyst removal-pt 8 months pregnant  . Total knee arthroplasty  02/11/2012    Procedure: TOTAL KNEE ARTHROPLASTY;  Surgeon: Mauri Pole, MD;  Location: [facility];  Service: Orthopedics;  Laterality: Right;    Family History  Problem Relation Age of Onset  . Alcohol abuse Mother   . Other Mother     accidental med overdose  . Emphysema Mother     smoked  . Bipolar disorder Mother   . Other Father     CHF  . Diabetes Father   . Cancer Father     throat ca/ smoked  . Alcohol  abuse Father   . Stroke Father   . Hypertension Brother   . Hyperlipidemia Brother   . Obesity Brother   . Heart attack Maternal Grandfather   . Heart attack Paternal Grandmother   . Heart attack Paternal Grandfather   . Arthritis Son     psoriatic  . Arthritis Son     psoriatic  . Obesity Son     History   Social History  . Marital Status: Married    Spouse Name: N/A    Number of Children: N/A  . Years of Education: N/A   Occupational History  . Not on file.   Social History Main Topics  . Smoking status: Never Smoker   . Smokeless tobacco: Never Used  . Alcohol Use: No     Comment: special occasions  . Drug Use: No  . Sexual Activity: Yes   Other Topics Concern  . Not on file   Social History Narrative  . No narrative on file    Current Outpatient Prescriptions on File Prior to Visit  Medication Sig Dispense Refill  . albuterol  (PROVENTIL HFA;VENTOLIN HFA) 108 (90 BASE) MCG/ACT inhaler Inhale 2 puffs into the lungs every 6 (six) hours as needed for wheezing or shortness of breath.  1 Inhaler  0  . ALPRAZolam (XANAX) 0.25 MG tablet Take 0.25 mg by mouth daily as needed. For anxiety      . aspirin 81 MG tablet Take 81 mg by mouth daily.      [name] atorvastatin (LIPITOR) 10 MG tablet Take 10 mg by mouth daily.      [name] azithromycin (ZITHROMAX) 250 MG tablet 2 tabs by mouth today, then one tab by mouth once daily for 4 more days.  6 tablet  0  . bifidobacterium infantis (ALIGN) capsule Take 1 capsule by mouth daily.       . calcitRIOL (ROCALTROL) 0.5 MCG capsule Take 0.5 mcg by mouth daily.       . clonazePAM (KLONOPIN) 1 MG tablet Take 1 mg by mouth at bedtime.       . DULoxetine (CYMBALTA) 60 MG capsule Take 60 mg by mouth every morning.       . Fluticasone-Salmeterol (ADVAIR DISKUS) 250-50 MCG/DOSE AEPB inhale 1 puff by mouth twice a day  180 each  1  . ipratropium (ATROVENT) 0.06 % nasal spray Place 2 sprays into the nose 2 (two) times daily.       [name] lamoTRIgine (LAMICTAL) 100 MG tablet Take 50 mg by mouth at bedtime.       [name] levocetirizine (XYZAL) 5 MG tablet Take 5 mg by mouth every morning.       . methocarbamol (ROBAXIN) 500 MG tablet Take 1 tablet (500 mg total) by mouth every 6 (six) hours as needed (muscle spasms).  50 tablet  0  . methylphenidate (RITALIN) 10 MG tablet Take 10 mg by mouth 2 (two) times daily.      . montelukast (SINGULAIR) 10 MG tablet Take 1 tablet (10 mg total) by mouth daily.  30 tablet  3  . NON FORMULARY once a week.       . NON FORMULARY CPAP- set on [employer].      . Olopatadine HCl (PATANASE) 0.6 % SOLN Place 2 sprays into the nose every morning.       . pantoprazole (PROTONIX) 40 MG tablet take 1 tablet by mouth at bedtime  30 tablet  4  . ranitidine (ZANTAC) 150 MG tablet take  1 tablet by mouth twice a day ALTERNATE WITH PANTOPRAZOLE  60 tablet  3  . solifenacin (VESICARE) 10 MG tablet Take  10 mg by mouth daily.      . traZODone (DESYREL) 50 MG tablet Take 50 mg by mouth at bedtime.       . VESICARE 10 MG tablet take 1 tablet by mouth once daily  30 tablet  3   No current facility-administered medications on file prior to visit.    Allergies  Allergen Reactions  . Hydrocodone     Ask patient severity and reaction. Not indicated on medical history dated 07/18/09.02-01-12 nausea and vomiting and headaches.  . Cephalexin     Ask patient severity and reaction. Not indicated on medical history dated 07/18/09.? Was this or Levaquin that caused Neuropathy  . Codeine     Crazy in the head, bp drops  . Levofloxacin Itching and Other (See Comments)    Neuropathy   . Meperidine Hcl     B/P drops  . Morphine And Related     Ask patient severity and reaction. Not indicated on medical history dated 07/18/09. 02-01-12 States" extreme migraines"  . Oxycodone-Acetaminophen Other (See Comments)    Crazy in head, drop in bp  . Penicillins Nausea And Vomiting  . Sulfonamide Derivatives Nausea And Vomiting    Stomach upset  . Vicodin [Hydrocodone-Acetaminophen]     Ask patient severity and reaction. Not indicated on medical history dated 07/18/09.02-01-12 same as Hydrocodone  . Adhesive [Tape] Rash    Latex in adhesive tape. Please use paper tape    Review of Systems  Review of Systems  Constitutional: Positive for malaise/fatigue. Negative for fever.  HENT: Negative for congestion.   Eyes: Negative for discharge.  Respiratory: Negative for shortness of breath.   Cardiovascular: Negative for chest pain, palpitations and leg swelling.  Gastrointestinal: Negative for nausea, abdominal pain and diarrhea.  Genitourinary: Negative for dysuria.  Musculoskeletal: Positive for myalgias. Negative for falls.  Skin: Negative for rash.  Neurological: Negative for loss of consciousness and headaches.  Endo/Heme/Allergies: Negative for polydipsia.  Psychiatric/Behavioral: Positive for depression.  Negative for suicidal ideas. The patient is not nervous/anxious and does not have insomnia.     Objective  BP 112/72  Pulse 58  Temp(Src) 98.6 F (37 C) (Oral)  Ht 4\' 11"  (1.499 m)  Wt 203 lb 1.9 oz (92.135 kg)  BMI 41.00 kg/m2  SpO2 97%  Physical Exam  Physical Exam  Constitutional: She is oriented to person, place, and time and well-developed, well-nourished, and in no distress. No distress.  HENT:  Head: Normocephalic and atraumatic.  Eyes: Conjunctivae are normal.  Neck: Neck supple. No thyromegaly present.  Cardiovascular: Normal rate, regular rhythm and normal heart sounds.   No murmur heard. Pulmonary/Chest: Effort normal and breath sounds normal. She has no wheezes.  Abdominal: She exhibits no distension and no mass.  Musculoskeletal: She exhibits no edema.  Lymphadenopathy:    She has no cervical adenopathy.  Neurological: She is alert and oriented to person, place, and time.  Skin: Skin is warm and dry. No rash noted. She is not diaphoretic.  Psychiatric: Memory, affect and judgment normal.    Lab Results  Component Value Date   TSH 2.199 11/28/2012   Lab Results  Component Value Date   WBC 5.7 02/02/2013   HGB 12.4 02/02/2013   HCT 36.8 02/02/2013   MCV 88.2 02/02/2013   PLT 205 02/02/2013   Lab Results  Component Value  Date   CREATININE 0.83 11/28/2012   BUN 16 11/28/2012   NA 141 11/28/2012   K 4.1 11/28/2012   CL 106 11/28/2012   CO2 27 11/28/2012   Lab Results  Component Value Date   ALT 11 02/02/2013   AST 15 02/02/2013   ALKPHOS 105 02/02/2013   BILITOT 0.8 02/02/2013   Lab Results  Component Value Date   CHOL 121 02/02/2013   Lab Results  Component Value Date   HDL 51 02/02/2013   Lab Results  Component Value Date   LDLCALC 55 02/02/2013   Lab Results  Component Value Date   TRIG 76 02/02/2013   Lab Results  Component Value Date   CHOLHDL 2.4 02/02/2013     Assessment & Plan  ESSENTIAL HYPERTENSION, BENIGN Well controlled, no  changes  Acute bronchitis with bronchospasm Did not tolerate Prednisone, does feel she is improving s/p Zpak, encouraged probiotics and increased rest and hydratioin.  SLEEP APNEA, OBSTRUCTIVE Is following with pulmonology and they are treating with CPAP has been diagnosed wut Narcolepsy clinically. Will continue with pulmonology.  DM hgba1c 5.6 avoid simple carbs, continue current meds  HYPERLIPIDEMIA Well controlled, tolerating Atorvastatin. Avoid trans fats

## 2013-02-17 DIAGNOSIS — L819 Disorder of pigmentation, unspecified: Secondary | ICD-10-CM | POA: Diagnosis not present

## 2013-02-17 DIAGNOSIS — D1801 Hemangioma of skin and subcutaneous tissue: Secondary | ICD-10-CM | POA: Diagnosis not present

## 2013-02-17 DIAGNOSIS — L821 Other seborrheic keratosis: Secondary | ICD-10-CM | POA: Diagnosis not present

## 2013-02-17 DIAGNOSIS — L820 Inflamed seborrheic keratosis: Secondary | ICD-10-CM | POA: Diagnosis not present

## 2013-02-17 DIAGNOSIS — D239 Other benign neoplasm of skin, unspecified: Secondary | ICD-10-CM | POA: Diagnosis not present

## 2013-02-17 DIAGNOSIS — Z85828 Personal history of other malignant neoplasm of skin: Secondary | ICD-10-CM | POA: Diagnosis not present

## 2013-02-19 ENCOUNTER — Other Ambulatory Visit: Payer: Self-pay | Admitting: Family Medicine

## 2013-02-19 NOTE — Telephone Encounter (Signed)
Rx request to pharmacy/[facility]  

## 2013-03-11 DIAGNOSIS — H11159 Pinguecula, unspecified eye: Secondary | ICD-10-CM | POA: Diagnosis not present

## 2013-03-11 DIAGNOSIS — H18419 Arcus senilis, unspecified eye: Secondary | ICD-10-CM | POA: Diagnosis not present

## 2013-03-11 DIAGNOSIS — H43399 Other vitreous opacities, unspecified eye: Secondary | ICD-10-CM | POA: Diagnosis not present

## 2013-03-11 DIAGNOSIS — H2510 Age-related nuclear cataract, unspecified eye: Secondary | ICD-10-CM | POA: Diagnosis not present

## 2013-03-23 DIAGNOSIS — M7981 Nontraumatic hematoma of soft tissue: Secondary | ICD-10-CM | POA: Diagnosis not present

## 2013-03-23 DIAGNOSIS — M79609 Pain in unspecified limb: Secondary | ICD-10-CM | POA: Diagnosis not present

## 2013-03-23 DIAGNOSIS — I8310 Varicose veins of unspecified lower extremity with inflammation: Secondary | ICD-10-CM | POA: Diagnosis not present

## 2013-03-26 DIAGNOSIS — I6529 Occlusion and stenosis of unspecified carotid artery: Secondary | ICD-10-CM | POA: Diagnosis not present

## 2013-03-31 ENCOUNTER — Encounter: Payer: Self-pay | Admitting: Internal Medicine

## 2013-04-06 DIAGNOSIS — I6529 Occlusion and stenosis of unspecified carotid artery: Secondary | ICD-10-CM | POA: Diagnosis not present

## 2013-04-06 DIAGNOSIS — I359 Nonrheumatic aortic valve disorder, unspecified: Secondary | ICD-10-CM | POA: Diagnosis not present

## 2013-04-06 DIAGNOSIS — R0602 Shortness of breath: Secondary | ICD-10-CM | POA: Diagnosis not present

## 2013-04-06 DIAGNOSIS — E785 Hyperlipidemia, unspecified: Secondary | ICD-10-CM | POA: Diagnosis not present

## 2013-04-21 DIAGNOSIS — M79609 Pain in unspecified limb: Secondary | ICD-10-CM | POA: Diagnosis not present

## 2013-04-21 DIAGNOSIS — I8310 Varicose veins of unspecified lower extremity with inflammation: Secondary | ICD-10-CM | POA: Diagnosis not present

## 2013-04-21 DIAGNOSIS — M7981 Nontraumatic hematoma of soft tissue: Secondary | ICD-10-CM | POA: Diagnosis not present

## 2013-04-28 ENCOUNTER — Ambulatory Visit (INDEPENDENT_AMBULATORY_CARE_PROVIDER_SITE_OTHER): Payer: Medicare Other

## 2013-04-28 DIAGNOSIS — J309 Allergic rhinitis, unspecified: Secondary | ICD-10-CM | POA: Diagnosis not present

## 2013-05-04 ENCOUNTER — Ambulatory Visit: Payer: Medicare Other | Admitting: Internal Medicine

## 2013-05-06 ENCOUNTER — Other Ambulatory Visit: Payer: Self-pay | Admitting: Family Medicine

## 2013-05-13 ENCOUNTER — Encounter: Payer: Self-pay | Admitting: Internal Medicine

## 2013-05-13 ENCOUNTER — Ambulatory Visit (INDEPENDENT_AMBULATORY_CARE_PROVIDER_SITE_OTHER): Payer: Medicare Other | Admitting: Internal Medicine

## 2013-05-13 VITALS — BP 144/78 | HR 64 | Temp 99.8°F | Ht 59.0 in | Wt 208.0 lb

## 2013-05-13 DIAGNOSIS — G4733 Obstructive sleep apnea (adult) (pediatric): Secondary | ICD-10-CM

## 2013-05-13 DIAGNOSIS — H1010 Acute atopic conjunctivitis, unspecified eye: Secondary | ICD-10-CM

## 2013-05-13 DIAGNOSIS — J209 Acute bronchitis, unspecified: Secondary | ICD-10-CM | POA: Diagnosis not present

## 2013-05-13 DIAGNOSIS — J3089 Other allergic rhinitis: Secondary | ICD-10-CM

## 2013-05-13 DIAGNOSIS — J302 Other seasonal allergic rhinitis: Secondary | ICD-10-CM

## 2013-05-13 DIAGNOSIS — G47419 Narcolepsy without cataplexy: Secondary | ICD-10-CM

## 2013-05-13 DIAGNOSIS — I079 Rheumatic tricuspid valve disease, unspecified: Secondary | ICD-10-CM

## 2013-05-13 MED ORDER — METHYLPREDNISOLONE ACETATE 80 MG/ML IJ SUSP
80.0000 mg | Freq: Once | INTRAMUSCULAR | Status: AC
Start: 2013-05-13 — End: 2013-05-13
  Administered 2013-05-13: 80 mg via INTRAMUSCULAR

## 2013-05-13 MED ORDER — HYDROCOD POLST-CHLORPHEN POLST 10-8 MG/5ML PO LQCR: ORAL | Status: DC

## 2013-05-13 MED ORDER — AZITHROMYCIN 250 MG PO TABS: ORAL_TABLET | ORAL | Status: DC

## 2013-05-13 NOTE — Assessment & Plan Note (Addendum)
Faint, coarse R upper sternal border systolic heart murmur. She reports known chronic murmur.

## 2013-05-13 NOTE — Assessment & Plan Note (Signed)
Controlled.  

## 2013-05-13 NOTE — Progress Notes (Signed)
Subjective:    Patient ID: [name], female    DOB: 10/02/1945, 68 y.o.   MRN: [id number]  HPI 09/21/10- 68 year old female never smoker followed for allergic rhinitis, asthma, OSA, complicated by GERD, DM, HBP, obesity. Last here August 08, 2009 CPAP 12- Able to wear it all night, comfortable now. . Colostomy (colon nicked in a bladder sling procedure) was reversed. Had to revise the wound for staph and incidentally had a squamous cell skin cancer removed. Had iron infusion for anemia. No major respiratory problems through the past year. For the past 2-3 weeks has had burning and tearing of eyes with some postnasal drip, raspy throat.  Daily xyzal, patanase, singulair and ipratropium eye nasal spray..  Not aware of drafts into eye. Tried optivar, visine. Denies glaucoma.  08/02/11- 68 year old female never smoker followed for allergic rhinitis, asthma, OSA, complicated by GERD, DM, HBP, obesity. Still on vaccine, cough -productive-light yellow in color(finished Zpak today; both ears painful(full),and nasal drainage. Still using CPAP every night  and will get with DME for mask options. Bronchitis x5 days with dark yellow sputum. Color clearing with Z-Pak. Admits wheezing and frontal sinus pressure without headache, fever, sore throat or grossly purulent sputum.  11/07/12- 68 year old female never smoker followed for allergic rhinitis, asthma, OSA, complicated by GERD, DM, HBP, obesity. FOLLOWS FOR: still on allergy vaccine and no reactions; cough x 6 weeks(has had 2 rds of abx and prednisone)Finished the prednisone monday. NPSG 03/23/1999- AHI 156/ hr, weight 202lbs before bariatric surgery, CPAP recently 12. [facility] screen 07/24/12- AHI 2.0/ hr, weight 209lbs.  Bronchitis/sinusitis responded in July to a Z-Pak and prednisone from her primary physician. She then relapsed and a second round of Z-Pak and prednisone ended last week. Persistent dry hacking cough increased by  activity and temperature changes. She is using Advair Singulair and Tessalon. She says she can tolerate Tussionex but not other hydrocodone products.  12/25/12-68 year old female never smoker followed for allergic rhinitis, asthma, OSA, complicated by GERD, DM, HBP, obesity. Allergy vaccine 1:10 GO FOLLOWS FOR: still on vaccine and doing well; discuss ordering full NPSG Cough is finally better. Blames postnasal drip despite Patanase and ipratropium. No sinus pressure. Taking clonazepam 1 mg at bedtime for leg jerks. Ritalin only if can't stay awake at work. Always tired and thinks her CPAP is not working. Interested in update full sleep study to reevaluate. CXR 11/07/12 IMPRESSION:  No acute cardiopulmonary process. Left midlung atelectasis and or  scarring.  Electronically Signed  By: Lovey Newcomer M.D.  On: 11/07/2012 17:12  02/03/13-68 year old female never smoker followed for allergic rhinitis, asthma, OSA, complicated by GERD, DM, HBP, obesity. Allergy vaccine 1:10 GO FOLLOWS FOR: review sleep study and MSLT with patient; patient is having a cough since Friday-was tested for flu-neg. Acute bronchitis- Dr Randel Pigg yesterday gave neb, albuterol inhaler, Zpak. Prednisone taper was not started. OSA- has continued CPAP 13/[employer]. NPSG 01/25/13- AHI 7.6/ hr, mild OSA, weight 213 lbs. MSLT 01/26/13- mean latency 0.9 minutes, SOREM 1/5 naps. Pathologic daytime sleepiness consistent with narcolepsy or idiopathic, but nonspecific because she had taken Klonopin and Lamictal the night before, Cymbalta and Xyzal the day of the test. CXR 02/02/13 IMPRESSION:  Findings suggestive of bronchitis. No focal infiltrate to suggest  pneumonia identified.  Electronically Signed  By: Jeannine Boga M.D.  On: 02/02/2013 17:52  05/13/13- 68 yoF never smoker followed for allergic rhinitis, asthma, OSA/ Narcolepsy , complicated by GERD, DM, HBP, obesity/ pace,maker. OSA- has continued CPAP 13/[facility]  Allergy  vaccine 1:10 GO Presents for- runny nose, sinus congestion, vocal changes, right ear pain X5 days.  pt has an infant grandchild in the hospital, wants to be sure she is not contagious.   Cough started after being outside- pollen. No sick exposure. Eyes watering.  Yellow, frontal HA, cough hurts when she coughs. Hurts behind angle R jaw- eustachian distribution. She says she can tolerate tussionex occasionally, otherwise avoids narcotics.  Review of Systems-see HPI  Constitutional:   No-   weight loss, night sweats, fevers, chills, +fatigue, lassitude. HEENT:   No-  headaches, difficulty swallowing, tooth/dental problems, sore throat,       No-  sneezing, itching, +ear ache, + nasal congestion, +post nasal drip,  CV:  No-   chest pain, orthopnea, PND, swelling in lower extremities, anasarca, dizziness, palpitations Resp: No-   shortness of breath with exertion or at rest.              No- productive cough,  + non-productive cough,  No-  coughing up of blood.              No-   change in color of mucus.  No- wheezing.   Skin: No-   rash or lesions. GI:  No-   heartburn, indigestion, abdominal pain, nausea, vomiting, GU:  MS:  No-   joint pain or swelling.   Neuro- nothing unusual Psych:  No- change in mood or affect. No depression or anxiety.  No memory loss.    Objective:   Physical Exam General- Alert, Oriented, Affect-appropriate, Distress- none acute  Very overweight Skin- rash-none, lesions- none, excoriation- none Lymphadenopathy- none Head- atraumatic            Eyes- Gross vision intact, PERRLA, conjunctivae clear, with watery thin secretions            Ears- Hearing, canals normal, TMs normal            Nose- Clear, No- Septal dev, mucus, polyps, erosion, perforation,             Throat- Mallampati II , mucosa clear , drainage- none, tonsils- atrophic Neck- flexible , trachea midline, no stridor , thyroid nl, carotid no bruit Chest - symmetrical excursion , unlabored            Heart/CV- RRR , +trace systolic murmur? TI or AS , no gallop  , no rub, nl s1 s2                           - JVD- none , edema- none, stasis changes- none, varices- right calf           Lung-  Clear chest, Cough+harsh, wheeze- none , dullness-none, rub- none           Chest wall-  Abd-  Br/ Gen/ Rectal- Not done, not indicated Extrem- cyanosis- none, clubbing, none, atrophy- none, strength- nl Neuro- grossly intact to observation  Assessment & Plan:

## 2013-05-13 NOTE — Assessment & Plan Note (Signed)
Continues CPAP with good compliance and control

## 2013-05-13 NOTE — Patient Instructions (Signed)
Neb neo nasal  Depo 80  Script printed for tussionex cough syrup to use if needed  Script sent for Zpak antibiotic  Throat lozenges as needed

## 2013-05-13 NOTE — Assessment & Plan Note (Addendum)
Acute exacerbation- allergy vs URI. Discussed her desire to visit sick grandchild. Assume may be contagious. Plan depomedrol, tussionex (she says she CAN use this occasionally), Zpak, neb nasal decongestant.

## 2013-05-13 NOTE — Assessment & Plan Note (Addendum)
Acute exacerbation- allergy vs URI. Discussed her desire to visit sick grandchild. Assume may be contagious. Plan depomedrol, tussionex (she says she CAN use this occasionally.  Plan- Zpak, depomedrol

## 2013-05-14 ENCOUNTER — Ambulatory Visit: Payer: Medicare Other | Admitting: Family Medicine

## 2013-05-15 ENCOUNTER — Other Ambulatory Visit: Payer: Self-pay | Admitting: Family Medicine

## 2013-05-15 NOTE — Telephone Encounter (Signed)
Rx request to pharmacy/[facility]  

## 2013-05-19 ENCOUNTER — Telehealth: Payer: Self-pay | Admitting: Family Medicine

## 2013-05-19 DIAGNOSIS — E119 Type 2 diabetes mellitus without complications: Secondary | ICD-10-CM

## 2013-05-19 DIAGNOSIS — I10 Essential (primary) hypertension: Secondary | ICD-10-CM

## 2013-05-19 DIAGNOSIS — E559 Vitamin D deficiency, unspecified: Secondary | ICD-10-CM

## 2013-05-19 DIAGNOSIS — E212 Other hyperparathyroidism: Secondary | ICD-10-CM

## 2013-05-19 DIAGNOSIS — E785 Hyperlipidemia, unspecified: Secondary | ICD-10-CM

## 2013-05-19 NOTE — Telephone Encounter (Signed)
Patient has appointment on 06/08/13 and will need labs prior. She will be going to the [facility] lab

## 2013-05-21 ENCOUNTER — Other Ambulatory Visit: Payer: Self-pay | Admitting: Obstetrics and Gynecology

## 2013-05-21 DIAGNOSIS — Z1231 Encounter for screening mammogram for malignant neoplasm of breast: Secondary | ICD-10-CM

## 2013-05-27 ENCOUNTER — Ambulatory Visit
Admission: RE | Admit: 2013-05-27 | Discharge: 2013-05-27 | Disposition: A | Discharge status: Home / Self Care | Payer: Medicare Other | Source: Ambulatory Visit | Attending: Obstetrics and Gynecology | Admitting: Obstetrics and Gynecology

## 2013-05-27 DIAGNOSIS — Z1231 Encounter for screening mammogram for malignant neoplasm of breast: Secondary | ICD-10-CM

## 2013-06-01 ENCOUNTER — Other Ambulatory Visit (INDEPENDENT_AMBULATORY_CARE_PROVIDER_SITE_OTHER): Payer: Medicare Other

## 2013-06-01 DIAGNOSIS — I10 Essential (primary) hypertension: Secondary | ICD-10-CM | POA: Diagnosis not present

## 2013-06-01 DIAGNOSIS — E212 Other hyperparathyroidism: Secondary | ICD-10-CM

## 2013-06-01 DIAGNOSIS — E785 Hyperlipidemia, unspecified: Secondary | ICD-10-CM | POA: Diagnosis not present

## 2013-06-01 DIAGNOSIS — E119 Type 2 diabetes mellitus without complications: Secondary | ICD-10-CM

## 2013-06-01 LAB — RENAL FUNCTION PANEL
Albumin: 3.7 g/dL (ref 3.5–5.2)
BUN: 21 mg/dL (ref 6–23)
CALCIUM: 8.7 mg/dL (ref 8.4–10.5)
CO2: 28 mEq/L (ref 19–32)
CREATININE: 0.7 mg/dL (ref 0.4–1.2)
Chloride: 106 mEq/L (ref 96–112)
GFR: 88.9 mL/min (ref >60.00)
Glucose, Bld: 84 mg/dL (ref 70–99)
Phosphorus: 4.4 mg/dL (ref 2.3–4.6)
Potassium: 4.2 mEq/L (ref 3.5–5.1)
Sodium: 142 mEq/L (ref 135–145)

## 2013-06-01 LAB — LIPID PANEL
Cholesterol: 114 mg/dL (ref 0–200)
HDL: 53.1 mg/dL (ref >39.00)
LDL Cholesterol: 49 mg/dL (ref 0–99)
TRIGLYCERIDES: 59 mg/dL (ref 0.0–149.0)
Total CHOL/HDL Ratio: 2
VLDL: 11.8 mg/dL (ref 0.0–40.0)

## 2013-06-01 LAB — TSH: TSH: 1.38 u[IU]/mL (ref 0.35–4.50)

## 2013-06-01 LAB — HEPATIC FUNCTION PANEL
ALK PHOS: 108 U/L (ref 39–117)
ALT: 17 U/L (ref 0–35)
AST: 19 U/L (ref 0–37)
Albumin: 3.7 g/dL (ref 3.5–5.2)
Bilirubin, Direct: 0.1 mg/dL (ref 0.0–0.3)
Total Bilirubin: 0.8 mg/dL (ref 0.2–1.2)
Total Protein: 6.5 g/dL (ref 6.0–8.3)

## 2013-06-01 LAB — CBC
HEMATOCRIT: 37.8 % (ref 36.0–46.0)
HEMOGLOBIN: 12.4 g/dL (ref 12.0–15.0)
MCHC: 32.9 g/dL (ref 30.0–36.0)
MCV: 92.4 fl (ref 78.0–100.0)
Platelets: 242 10*3/uL (ref 150.0–400.0)
RBC: 4.09 Mil/uL (ref 3.87–5.11)
RDW: 14.4 % (ref 11.5–15.5)
WBC: 7.1 10*3/uL (ref 4.0–10.5)

## 2013-06-01 LAB — HEMOGLOBIN A1C: HEMOGLOBIN A1C: 5.7 % (ref 4.6–6.5)

## 2013-06-03 DIAGNOSIS — R109 Unspecified abdominal pain: Secondary | ICD-10-CM | POA: Diagnosis not present

## 2013-06-03 DIAGNOSIS — N390 Urinary tract infection, site not specified: Secondary | ICD-10-CM | POA: Diagnosis not present

## 2013-06-03 DIAGNOSIS — Z124 Encounter for screening for malignant neoplasm of cervix: Secondary | ICD-10-CM | POA: Diagnosis not present

## 2013-06-08 ENCOUNTER — Ambulatory Visit (INDEPENDENT_AMBULATORY_CARE_PROVIDER_SITE_OTHER): Payer: Medicare Other | Admitting: Family Medicine

## 2013-06-08 ENCOUNTER — Encounter: Payer: Self-pay | Admitting: Family Medicine

## 2013-06-08 VITALS — BP 124/74 | HR 57 | Temp 98.3°F | Ht 59.0 in | Wt 207.0 lb

## 2013-06-08 DIAGNOSIS — R197 Diarrhea, unspecified: Secondary | ICD-10-CM | POA: Diagnosis not present

## 2013-06-08 DIAGNOSIS — E785 Hyperlipidemia, unspecified: Secondary | ICD-10-CM

## 2013-06-08 DIAGNOSIS — E119 Type 2 diabetes mellitus without complications: Secondary | ICD-10-CM

## 2013-06-08 DIAGNOSIS — Z23 Encounter for immunization: Secondary | ICD-10-CM

## 2013-06-08 DIAGNOSIS — I10 Essential (primary) hypertension: Secondary | ICD-10-CM

## 2013-06-08 DIAGNOSIS — IMO0001 Reserved for inherently not codable concepts without codable children: Secondary | ICD-10-CM

## 2013-06-08 DIAGNOSIS — R05 Cough: Secondary | ICD-10-CM

## 2013-06-08 DIAGNOSIS — D509 Iron deficiency anemia, unspecified: Secondary | ICD-10-CM

## 2013-06-08 DIAGNOSIS — K219 Gastro-esophageal reflux disease without esophagitis: Secondary | ICD-10-CM

## 2013-06-08 DIAGNOSIS — R059 Cough, unspecified: Secondary | ICD-10-CM

## 2013-06-08 DIAGNOSIS — E212 Other hyperparathyroidism: Secondary | ICD-10-CM

## 2013-06-08 MED ORDER — CHOLESTYRAMINE 4 G PO PACK: 4.0000 g | PACK | Freq: Three times a day (TID) | ORAL | Status: DC

## 2013-06-08 MED ORDER — PNEUMOCOCCAL 13-VAL CONJ VACC IM SUSP: 0.5000 mL | Freq: Once | INTRAMUSCULAR | Status: DC

## 2013-06-08 NOTE — Assessment & Plan Note (Signed)
Roughly 3 loose stool, no bloody or tarry stool, try switching probiotics, maybe [facility].

## 2013-06-08 NOTE — Patient Instructions (Signed)
Consider [facility] Lomotil daily   Diet for Diarrhea, Adult Frequent, runny stools (diarrhea) may be caused or worsened by food or drink. Diarrhea may be relieved by changing your diet. Since diarrhea can last up to 7 days, it is easy for you to lose too much fluid from the body and become dehydrated. Fluids that are lost need to be replaced. Along with a modified diet, make sure you drink enough fluids to keep your urine clear or pale yellow. DIET INSTRUCTIONS  Ensure adequate fluid intake (hydration): have 1 cup (8 oz) of fluid for each diarrhea episode. Avoid fluids that contain simple sugars or sports drinks, fruit juices, whole milk products, and sodas. Your urine should be clear or pale yellow if you are drinking enough fluids. Hydrate with an oral rehydration solution that you can purchase at pharmacies, retail stores, and online. You can prepare an oral rehydration solution at home by mixing the following ingredients together:    tsp table salt.   tsp baking soda.   tsp salt substitute containing potassium chloride.  1  tablespoons sugar.  1 L (34 oz) of water.  Certain foods and beverages may increase the speed at which food moves through the gastrointestinal (GI) tract. These foods and beverages should be avoided and include:  Caffeinated and alcoholic beverages.  High-fiber foods, such as raw fruits and vegetables, nuts, seeds, and whole grain breads and cereals.  Foods and beverages sweetened with sugar alcohols, such as xylitol, sorbitol, and mannitol.  Some foods may be well tolerated and may help thicken stool including:  Starchy foods, such as rice, toast, pasta, low-sugar cereal, oatmeal, grits, baked potatoes, crackers, and bagels.   Bananas.   Applesauce.  Add probiotic-rich foods to help increase healthy bacteria in the GI tract, such as yogurt and fermented milk products. RECOMMENDED FOODS AND BEVERAGES Starches Choose foods with less than 2  g of fiber per serving.  Recommended:  White, [place], and pita breads, plain rolls, buns, bagels. Plain muffins, matzo. Soda, saltine, or graham crackers. Pretzels, melba toast, zwieback. Cooked cereals made with water: cornmeal, farina, cream cereals. Dry cereals: refined corn, wheat, rice. Potatoes prepared any way without skins, refined macaroni, spaghetti, noodles, refined rice.  Avoid:  Bread, rolls, or crackers made with whole wheat, multi-grains, rye, bran seeds, nuts, or coconut. Corn tortillas or taco shells. Cereals containing whole grains, multi-grains, bran, coconut, nuts, raisins. Cooked or dry oatmeal. Coarse wheat cereals, granola. Cereals advertised as "high-fiber." Potato skins. Whole grain pasta, wild or brown rice. Popcorn. Sweet potatoes, yams. Sweet rolls, doughnuts, waffles, pancakes, sweet breads. Vegetables  Recommended: Strained tomato and vegetable juices. Most well-cooked and canned vegetables without seeds. Fresh: Tender lettuce, cucumber without the skin, cabbage, spinach, bean sprouts.  Avoid: Fresh, cooked, or canned: Artichokes, baked beans, beet greens, broccoli, Brussels sprouts, corn, kale, legumes, peas, sweet potatoes. Cooked: Green or red cabbage, spinach. Avoid large servings of any vegetables because vegetables shrink when cooked, and they contain more fiber per serving than fresh vegetables. Fruit  Recommended: Cooked or canned: Apricots, applesauce, cantaloupe, cherries, fruit cocktail, grapefruit, grapes, kiwi, mandarin oranges, peaches, pears, plums, watermelon. Fresh: Apples without skin, ripe banana, grapes, cantaloupe, cherries, grapefruit, peaches, oranges, plums. Keep servings limited to  cup or 1 piece.  Avoid: Fresh: Apples with skin, apricots, mangoes, pears, raspberries, strawberries. Prune juice, stewed or dried prunes. Dried fruits, raisins, dates. Large servings of all fresh fruits. Protein  Recommended: Ground or well-cooked tender beef, ham,  veal,  lamb, pork, or poultry. Eggs. Fish, oysters, shrimp, lobster, other seafoods. Liver, organ meats.  Avoid: Tough, fibrous meats with gristle. Peanut butter, smooth or chunky. Cheese, nuts, seeds, legumes, dried peas, beans, lentils. Dairy  Recommended: Yogurt, lactose-free milk, kefir, drinkable yogurt, buttermilk, soy milk, or plain hard cheese.  Avoid: Milk, chocolate milk, beverages made with milk, such as milkshakes. Soups  Recommended: Bouillon, broth, or soups made from allowed foods. Any strained soup.  Avoid: Soups made from vegetables that are not allowed, cream or milk-based soups. Desserts and Sweets  Recommended: Sugar-free gelatin, sugar-free frozen ice pops made without sugar alcohol.  Avoid: Plain cakes and cookies, pie made with fruit, pudding, custard, cream pie. Gelatin, fruit, ice, sherbet, frozen ice pops. Ice cream, ice milk without nuts. Plain hard candy, honey, jelly, molasses, syrup, sugar, chocolate syrup, gumdrops, marshmallows. Fats and Oils  Recommended: Limit fats to less than 8 tsp per day.  Avoid: Seeds, nuts, olives, avocados. Margarine, butter, cream, mayonnaise, salad oils, plain salad dressings. Plain gravy, crisp bacon without rind. Beverages  Recommended: Water, decaffeinated teas, oral rehydration solutions, sugar-free beverages not sweetened with sugar alcohols.  Avoid: Fruit juices, caffeinated beverages (coffee, tea, soda), alcohol, sports drinks, or lemon-lime soda. Condiments  Recommended: Ketchup, mustard, horseradish, vinegar, cocoa powder. Spices in moderation: allspice, basil, bay leaves, celery powder or leaves, cinnamon, cumin powder, curry powder, ginger, mace, marjoram, onion or garlic powder, oregano, paprika, parsley flakes, ground pepper, rosemary, sage, savory, tarragon, thyme, turmeric.  Avoid: Coconut, honey. Document Released: 03/31/2003 Document Revised: 10/03/2011 Document Reviewed: 05/25/2011 [facility] Patient  Information 2014 [place].

## 2013-06-08 NOTE — Assessment & Plan Note (Signed)
Finally resolved after several weeks.

## 2013-06-08 NOTE — Progress Notes (Signed)
Pre visit review using our clinic review tool, if applicable. No additional management support is needed unless otherwise documented below in the visit note. 

## 2013-06-10 ENCOUNTER — Encounter: Payer: Self-pay | Admitting: Family Medicine

## 2013-06-10 NOTE — Progress Notes (Signed)
Patient ID: [name], female   DOB: [birth year], 68 y.o.   MRN: [id number] DEANETTE TULLIUS [id number] 03-19-1945 06/10/2013      Progress Note-Follow Up  Subjective  Chief Complaint  Chief Complaint  Patient presents with  . Follow-up    3 month  . Injections    prevnar    HPI  Patient is a 68 year old female in today for routine medical care. She is in today for followup. He continues to struggle with chronic pain and fatigue. She's still working but is having a difficult time. Continues to struggle with low mood but no suicidal ideation. And also struggling with diarrhea. Roughly 3 loose stool daily with cramping. Is noting frequent and well. She's had some increased congestion secondary to allergies. Denies CP/palp/SOBfevers or GU c/o. Taking meds as prescribed  Past Medical History  Diagnosis Date  . Obesity   . Esophageal reflux   . Fibromyalgia   . Asthma   . Allergy     rhinitis  . Sleep apnea, obstructive   . Bronchitis, acute 10/31/2010  . Otitis media of both ears 11/16/2010  . Depression with anxiety 01/08/2011  . Back pain 01/08/2011  . Sinusitis 06/11/2011  . Fatigue 06/11/2011  . Knee pain, right 09/20/2011  . Otitis media 09/20/2011  . Varicose veins of lower limb 10/02/2011  . Otitis media of left ear 09/19/2010  . Motion sickness 12/24/2011  . Sinusitis 12/24/2011  . Nausea & vomiting 12/24/2011  . Heart murmur   . Panic attacks   . Arthritis 02-01-12    osteoarthritis, Back pain, spine surgeries ? retain hardware cervial and  lumbar.  . Anemia 02-01-12    iron absorption problem  . Cancer 02-01-12    squamous cell -face  . Allergic state 09/21/2010    Past Surgical History  Procedure Laterality Date  . Bariatric surgery    . Lumbar disc surgery    . Bladder suspension      complicated by bowel nick/ clolostomy  . Gallbladder surgery  1978  . Appendectomy  1962  . Gastric bypass  2003    Patient also noted "gastric bypass resection - 2004"  .  Cervical disc surgery      C-7 in 2004, C-4 and C-5 in 2010  . Colostomy bag      Confirm with patient. Listed under medical conditions on form dated 07/18/09.  [name]      Per medical history form dated 07/18/09.  [name] Hernia repair  2009    Two hernias and abdominal reconstruction  . Ivc filter      prophyllactically- no hx DVT  . Carpal tunnel release  02-01-12    right  . Colostomy reversal  02-01-12  . Abdominal surgery  02-01-12    ABDOMINAL SURGERY  . Vulva surgery  02-01-12    cyst removal-pt 8 months pregnant  . Total knee arthroplasty  02/11/2012    Procedure: TOTAL KNEE ARTHROPLASTY;  Surgeon: Mauri Pole, MD;  Location: [facility];  Service: Orthopedics;  Laterality: Right;    Family History  Problem Relation Age of Onset  . Alcohol abuse Mother   . Other Mother     accidental med overdose  . Emphysema Mother     smoked  . Bipolar disorder Mother   . Other Father     CHF  . Diabetes Father   . Cancer Father     throat ca/ smoked  . Alcohol abuse Father   .  Stroke Father   . Hypertension Brother   . Hyperlipidemia Brother   . Obesity Brother   . Heart attack Maternal Grandfather   . Heart attack Paternal Grandmother   . Heart attack Paternal Grandfather   . Arthritis Son     psoriatic  . Arthritis Son     psoriatic  . Obesity Son     History   Social History  . Marital Status: Married    Spouse Name: N/A    Number of Children: N/A  . Years of Education: N/A   Occupational History  . Not on file.   Social History Main Topics  . Smoking status: Never Smoker   . Smokeless tobacco: Never Used  . Alcohol Use: No     Comment: special occasions  . Drug Use: No  . Sexual Activity: Yes   Other Topics Concern  . Not on file   Social History Narrative  . No narrative on file    Current Outpatient Prescriptions on File Prior to Visit  Medication Sig Dispense Refill  . albuterol (PROVENTIL HFA;VENTOLIN HFA) 108 (90 BASE) MCG/ACT inhaler Inhale 2  puffs into the lungs every 6 (six) hours as needed for wheezing or shortness of breath.  1 Inhaler  0  . ALPRAZolam (XANAX) 0.25 MG tablet Take 0.25 mg by mouth daily as needed. For anxiety      . aspirin 81 MG tablet Take 81 mg by mouth daily.      [name] atorvastatin (LIPITOR) 10 MG tablet Take 10 mg by mouth daily.      . bifidobacterium infantis (ALIGN) capsule Take 1 capsule by mouth daily.       . calcitRIOL (ROCALTROL) 0.5 MCG capsule Take 0.5 mcg by mouth daily.       . clonazePAM (KLONOPIN) 1 MG tablet Take 1 mg by mouth at bedtime.       . DULoxetine (CYMBALTA) 60 MG capsule Take 60 mg by mouth every morning.       [name] ipratropium (ATROVENT) 0.06 % nasal spray Place 2 sprays into the nose 2 (two) times daily.       [name] lamoTRIgine (LAMICTAL) 100 MG tablet Take 50 mg by mouth at bedtime.       . methylphenidate (RITALIN) 10 MG tablet Take 10 mg by mouth 2 (two) times daily.      . NON FORMULARY once a week.       . NON FORMULARY CPAP- set on 12 [employer].      . Olopatadine HCl (PATANASE) 0.6 % SOLN Place 2 sprays into the nose every morning.       . pantoprazole (PROTONIX) 40 MG tablet take 1 tablet by mouth at bedtime  30 tablet  4  . ranitidine (ZANTAC) 150 MG tablet 1 tablet qam      . traZODone (DESYREL) 50 MG tablet Take 50 mg by mouth at bedtime.       . VESICARE 10 MG tablet take 1 tablet by mouth once daily  30 tablet  3   No current facility-administered medications on file prior to visit.    Allergies  Allergen Reactions  . Hydrocodone      nausea and vomiting and headaches.  . Cephalexin      Neuropathy  . Codeine     Crazy in the head, bp drops  . Levofloxacin Itching  . Meperidine Hcl     B/P drops  . Morphine And Related      States" extreme  migraines"  . Oxycodone-Acetaminophen Other (See Comments)    Crazy in head, drop in bp  . Sulfonamide Derivatives Nausea And Vomiting    Stomach upset  . Adhesive [Tape] Rash    Latex in adhesive tape. Please use paper tape  .  Penicillins Nausea And Vomiting and Rash    Review of Systems  Review of Systems  Constitutional: Positive for malaise/fatigue. Negative for fever.  HENT: Negative for congestion.   Eyes: Negative for discharge.  Respiratory: Negative for shortness of breath.   Cardiovascular: Negative for chest pain, palpitations and leg swelling.  Gastrointestinal: Positive for abdominal pain and diarrhea. Negative for nausea.  Genitourinary: Negative for dysuria.  Musculoskeletal: Positive for back pain and joint pain. Negative for falls.  Skin: Negative for rash.  Neurological: Positive for headaches. Negative for loss of consciousness.  Endo/Heme/Allergies: Negative for polydipsia.  Psychiatric/Behavioral: Negative for depression and suicidal ideas. The patient is not nervous/anxious and does not have insomnia.     Objective  BP 124/74  Pulse 57  Temp(Src) 98.3 F (36.8 C) (Oral)  Ht 4\' 11"  (1.499 m)  Wt 207 lb 0.6 oz (93.913 kg)  BMI 41.79 kg/m2  SpO2 97%  Physical Exam  Physical Exam  Constitutional: She is oriented to person, place, and time and well-developed, well-nourished, and in no distress. No distress.  HENT:  Head: Normocephalic and atraumatic.  Eyes: Conjunctivae are normal.  Neck: Neck supple. No thyromegaly present.  Cardiovascular: Normal rate, regular rhythm and normal heart sounds.   Pulmonary/Chest: Effort normal and breath sounds normal. She has no wheezes.  Abdominal: She exhibits no distension and no mass.  Musculoskeletal: She exhibits no edema.  Lymphadenopathy:    She has no cervical adenopathy.  Neurological: She is alert and oriented to person, place, and time.  Skin: Skin is warm and dry. No rash noted. She is not diaphoretic.  Psychiatric: Memory, affect and judgment normal.    Lab Results  Component Value Date   TSH 1.38 06/01/2013   Lab Results  Component Value Date   WBC 7.1 06/01/2013   HGB 12.4 06/01/2013   HCT 37.8 06/01/2013   MCV 92.4  06/01/2013   PLT 242.0 06/01/2013   Lab Results  Component Value Date   CREATININE 0.7 06/01/2013   BUN 21 06/01/2013   NA 142 06/01/2013   K 4.2 06/01/2013   CL 106 06/01/2013   CO2 28 06/01/2013   Lab Results  Component Value Date   ALT 17 06/01/2013   AST 19 06/01/2013   ALKPHOS 108 06/01/2013   BILITOT 0.8 06/01/2013   Lab Results  Component Value Date   CHOL 114 06/01/2013   Lab Results  Component Value Date   HDL 53.10 06/01/2013   Lab Results  Component Value Date   LDLCALC 49 06/01/2013   Lab Results  Component Value Date   TRIG 59.0 06/01/2013   Lab Results  Component Value Date   CHOLHDL 2 06/01/2013     Assessment & Plan  COUGH Finally resolved after several weeks.  Diarrhea Roughly 3 loose stool, no bloody or tarry stool, try switching probiotics, maybe [facility].   ANEMIA, IRON DEFICIENCY resolved  ESSENTIAL HYPERTENSION, BENIGN Denies CP/palp/SOB/HA/congestion/fevers/GI or GU c/o. Taking meds as prescribed  ESOPHAGEAL REFLUX Avoid offending foods, start probiotics. Do not eat large meals in late evening and consider raising head of bed. Continue Zantac, encouraged bland diet and report if loose stool does not improve.  OTHER HYPERPARATHYROIDISM Does not  see endocrinology routinely, will prescribe Calcitriol for patient  DM hgba1c acceptable, minimize simple carbs. Increase exercise as tolerated. No changes  FIBROMYALGIA Struggles with chronic pain, does not feel ready to change meds. Encouraged to increase exercise as tolerated  HYPERLIPIDEMIA Tolerating statin, encouraged heart healthy diet, avoid trans fats, minimize simple carbs and saturated fats. Increase exercise as tolerated

## 2013-06-10 NOTE — Assessment & Plan Note (Signed)
Denies CP/palp/SOB/HA/congestion/fevers/GI or GU c/o. Taking meds as prescribed 

## 2013-06-10 NOTE — Assessment & Plan Note (Signed)
Struggles with chronic pain, does not feel ready to change meds. Encouraged to increase exercise as tolerated

## 2013-06-10 NOTE — Assessment & Plan Note (Signed)
Tolerating statin, encouraged heart healthy diet, avoid trans fats, minimize simple carbs and saturated fats. Increase exercise as tolerated 

## 2013-06-10 NOTE — Assessment & Plan Note (Signed)
resolved 

## 2013-06-10 NOTE — Assessment & Plan Note (Signed)
hgba1c acceptable, minimize simple carbs. Increase exercise as tolerated. No changes 

## 2013-06-10 NOTE — Assessment & Plan Note (Signed)
Avoid offending foods, start probiotics. Do not eat large meals in late evening and consider raising head of bed. Continue Zantac, encouraged bland diet and report if loose stool does not improve.

## 2013-06-10 NOTE — Assessment & Plan Note (Signed)
Does not see endocrinology routinely, will prescribe Calcitriol for patient

## 2013-07-01 DIAGNOSIS — I8310 Varicose veins of unspecified lower extremity with inflammation: Secondary | ICD-10-CM | POA: Diagnosis not present

## 2013-07-03 DIAGNOSIS — M7981 Nontraumatic hematoma of soft tissue: Secondary | ICD-10-CM | POA: Diagnosis not present

## 2013-07-03 DIAGNOSIS — M79609 Pain in unspecified limb: Secondary | ICD-10-CM | POA: Diagnosis not present

## 2013-07-03 DIAGNOSIS — I8310 Varicose veins of unspecified lower extremity with inflammation: Secondary | ICD-10-CM | POA: Diagnosis not present

## 2013-07-06 ENCOUNTER — Ambulatory Visit: Payer: Medicare Other | Admitting: Internal Medicine

## 2013-07-13 DIAGNOSIS — L509 Urticaria, unspecified: Secondary | ICD-10-CM | POA: Diagnosis not present

## 2013-07-13 DIAGNOSIS — R300 Dysuria: Secondary | ICD-10-CM | POA: Diagnosis not present

## 2013-07-15 ENCOUNTER — Encounter: Payer: Self-pay | Admitting: Internal Medicine

## 2013-07-15 ENCOUNTER — Telehealth: Payer: Self-pay | Admitting: Internal Medicine

## 2013-07-15 ENCOUNTER — Ambulatory Visit (INDEPENDENT_AMBULATORY_CARE_PROVIDER_SITE_OTHER): Payer: Medicare Other | Admitting: Internal Medicine

## 2013-07-15 VITALS — BP 120/72 | HR 63 | Ht 59.0 in | Wt 211.0 lb

## 2013-07-15 DIAGNOSIS — G47419 Narcolepsy without cataplexy: Secondary | ICD-10-CM | POA: Diagnosis not present

## 2013-07-15 DIAGNOSIS — L299 Pruritus, unspecified: Secondary | ICD-10-CM

## 2013-07-15 DIAGNOSIS — J302 Other seasonal allergic rhinitis: Secondary | ICD-10-CM

## 2013-07-15 DIAGNOSIS — J3089 Other allergic rhinitis: Secondary | ICD-10-CM | POA: Diagnosis not present

## 2013-07-15 DIAGNOSIS — G4733 Obstructive sleep apnea (adult) (pediatric): Secondary | ICD-10-CM

## 2013-07-15 MED ORDER — METHYLPREDNISOLONE ACETATE 80 MG/ML IJ SUSP
80.0000 mg | Freq: Once | INTRAMUSCULAR | Status: AC
  Administered 2013-07-15: 80 mg via INTRAMUSCULAR

## 2013-07-15 NOTE — Patient Instructions (Addendum)
Suggest Allegra / fexofenadine 180     1 daily    Take Zantac twice daily  Depo 80  You can also take occasional benadryl if needed  If this doesn't get better soon we will draw some labs and try other meds.

## 2013-07-15 NOTE — Progress Notes (Signed)
Subjective:    Patient ID: [name], female    DOB: 10/02/1945, 68 y.o.   MRN: [id number]  HPI 09/21/10- 68 year old female never smoker followed for allergic rhinitis, asthma, OSA, complicated by GERD, DM, HBP, obesity. Last here August 08, 2009 CPAP 12- Able to wear it all night, comfortable now. . Colostomy (colon nicked in a bladder sling procedure) was reversed. Had to revise the wound for staph and incidentally had a squamous cell skin cancer removed. Had iron infusion for anemia. No major respiratory problems through the past year. For the past 2-3 weeks has had burning and tearing of eyes with some postnasal drip, raspy throat.  Daily xyzal, patanase, singulair and ipratropium eye nasal spray..  Not aware of drafts into eye. Tried optivar, visine. Denies glaucoma.  08/02/11- 68 year old female never smoker followed for allergic rhinitis, asthma, OSA, complicated by GERD, DM, HBP, obesity. Still on vaccine, cough -productive-light yellow in color(finished Zpak today; both ears painful(full),and nasal drainage. Still using CPAP every night  and will get with DME for mask options. Bronchitis x5 days with dark yellow sputum. Color clearing with Z-Pak. Admits wheezing and frontal sinus pressure without headache, fever, sore throat or grossly purulent sputum.  11/07/12- 68 year old female never smoker followed for allergic rhinitis, asthma, OSA, complicated by GERD, DM, HBP, obesity. FOLLOWS FOR: still on allergy vaccine and no reactions; cough x 6 weeks(has had 2 rds of abx and prednisone)Finished the prednisone monday. NPSG 03/23/1999- AHI 156/ hr, weight 202lbs before bariatric surgery, CPAP recently 12. [facility] screen 07/24/12- AHI 2.0/ hr, weight 209lbs.  Bronchitis/sinusitis responded in July to a Z-Pak and prednisone from her primary physician. She then relapsed and a second round of Z-Pak and prednisone ended last week. Persistent dry hacking cough increased by  activity and temperature changes. She is using Advair Singulair and Tessalon. She says she can tolerate Tussionex but not other hydrocodone products.  12/25/12-68 year old female never smoker followed for allergic rhinitis, asthma, OSA, complicated by GERD, DM, HBP, obesity. Allergy vaccine 1:10 GO FOLLOWS FOR: still on vaccine and doing well; discuss ordering full NPSG Cough is finally better. Blames postnasal drip despite Patanase and ipratropium. No sinus pressure. Taking clonazepam 1 mg at bedtime for leg jerks. Ritalin only if can't stay awake at work. Always tired and thinks her CPAP is not working. Interested in update full sleep study to reevaluate. CXR 11/07/12 IMPRESSION:  No acute cardiopulmonary process. Left midlung atelectasis and or  scarring.  Electronically Signed  By: Lovey Newcomer M.D.  On: 11/07/2012 17:12  02/03/13-68 year old female never smoker followed for allergic rhinitis, asthma, OSA, complicated by GERD, DM, HBP, obesity. Allergy vaccine 1:10 GO FOLLOWS FOR: review sleep study and MSLT with patient; patient is having a cough since Friday-was tested for flu-neg. Acute bronchitis- Dr Randel Pigg yesterday gave neb, albuterol inhaler, Zpak. Prednisone taper was not started. OSA- has continued CPAP 13/[facility]. NPSG 01/25/13- AHI 7.6/ hr, mild OSA, weight 213 lbs. MSLT 01/26/13- mean latency 0.9 minutes, SOREM 1/5 naps. Pathologic daytime sleepiness consistent with narcolepsy or idiopathic, but nonspecific because she had taken Klonopin and Lamictal the night before, Cymbalta and Xyzal the day of the test. CXR 02/02/13 IMPRESSION:  Findings suggestive of bronchitis. No focal infiltrate to suggest  pneumonia identified.  Electronically Signed  By: Jeannine Boga M.D.  On: 02/02/2013 17:52  05/13/13- 68 yoF never smoker followed for allergic rhinitis, asthma, OSA/ Narcolepsy , complicated by GERD, DM, HBP, obesity/ pace,maker. OSA- has continued CPAP 13/[facility]  Allergy  vaccine 1:10 GO Presents for- runny nose, sinus congestion, vocal changes, right ear pain X5 days.  pt has an infant grandchild in the hospital, wants to be sure she is not contagious.   Cough started after being outside- pollen. No sick exposure. Eyes watering.  Yellow, frontal HA, cough hurts when she coughs. Hurts behind angle R jaw- eustachian distribution. She says she can tolerate tussionex occasionally, otherwise avoids narcotics.  07/15/13- 68 yoF never smoker followed for allergic rhinitis, asthma, OSA/ Narcolepsy , complicated by GERD, DM, HBP, obesity/ pacemaker. ACUTE VISIT:  C/O:  itching from head to toe over the past week.  Reports skin is raw from itching so much Generalized pruritus over the past week with no similar experience in the past. No visible rash. Does not recognize any change of exposure. Has not been outside much. Already on Zantac plus Allegra and continues allergy vaccine 1:10 GO CPAP 13/ [facility]- doing fine  Review of Systems-see HPI  Constitutional:   No-   weight loss, night sweats, fevers, chills, +fatigue, lassitude. HEENT:   No-  headaches, difficulty swallowing, tooth/dental problems, sore throat,       No-  sneezing, +itching, +ear ache, + nasal congestion, +post nasal drip,  CV:  No-   chest pain, orthopnea, PND, swelling in lower extremities, anasarca, dizziness, palpitations Resp: No-   shortness of breath with exertion or at rest.              No- productive cough,  + non-productive cough,  No-  coughing up of blood.              No-   change in color of mucus.  No- wheezing.   Skin: No-   rash or lesions. GI:  No-   heartburn, indigestion, abdominal pain, nausea, vomiting, GU:  MS:  No-   joint pain or swelling.   Neuro- nothing unusual Psych:  No- change in mood or affect. No depression or anxiety.  No memory loss.    Objective:   Physical Exam General- Alert, Oriented, Affect-appropriate, Distress- none acute  Very overweight Skin- rash-none,  lesions- none, excoriation+ few areas Lymphadenopathy- none Head- atraumatic            Eyes- Gross vision intact, PERRLA, conjunctivae clear, with watery thin secretions            Ears- Hearing, canals normal, TMs normal            Nose- Clear, No- Septal dev, mucus, polyps, erosion, perforation,             Throat- Mallampati II , mucosa clear , drainage- none, tonsils- atrophic Neck- flexible , trachea midline, no stridor , thyroid nl, carotid no bruit Chest - symmetrical excursion , unlabored           Heart/CV- RRR , +trace systolic murmur? TI or AS , no gallop  , no rub, nl s1 s2                           - JVD- none , edema- none, stasis changes- none, varices- right calf           Lung-  Clear chest, Cough-none, wheeze- none , dullness-none, rub- none           Chest wall-  Abd-  Br/ Gen/ Rectal- Not done, not indicated Extrem- cyanosis- none, clubbing, none, atrophy- none, strength- nl Neuro- grossly intact to observation  Assessment & Plan:

## 2013-07-15 NOTE — Telephone Encounter (Signed)
MADE PT APPT WITH YOUNG AT 10:15 PT AWARE/CB

## 2013-07-16 ENCOUNTER — Other Ambulatory Visit (INDEPENDENT_AMBULATORY_CARE_PROVIDER_SITE_OTHER): Payer: Medicare Other

## 2013-07-16 ENCOUNTER — Telehealth: Payer: Self-pay | Admitting: *Deleted

## 2013-07-16 ENCOUNTER — Telehealth: Payer: Self-pay | Admitting: Internal Medicine

## 2013-07-16 DIAGNOSIS — L509 Urticaria, unspecified: Secondary | ICD-10-CM

## 2013-07-16 LAB — CBC WITH DIFFERENTIAL/PLATELET
Basophils Absolute: 0 10*3/uL (ref 0.0–0.1)
Basophils Relative: 0.3 % (ref 0.0–3.0)
EOS ABS: 0.3 10*3/uL (ref 0.0–0.7)
EOS PCT: 3.5 % (ref 0.0–5.0)
HCT: 34.7 % — ABNORMAL LOW (ref 36.0–46.0)
Hemoglobin: 11.6 g/dL — ABNORMAL LOW (ref 12.0–15.0)
LYMPHS PCT: 15.8 % (ref 12.0–46.0)
Lymphs Abs: 1.2 10*3/uL (ref 0.7–4.0)
MCHC: 33.5 g/dL (ref 30.0–36.0)
MCV: 91.6 fl (ref 78.0–100.0)
Monocytes Absolute: 0.5 10*3/uL (ref 0.1–1.0)
Monocytes Relative: 5.9 % (ref 3.0–12.0)
NEUTROS PCT: 74.5 % (ref 43.0–77.0)
Neutro Abs: 5.9 10*3/uL (ref 1.4–7.7)
Platelets: 232 10*3/uL (ref 150.0–400.0)
RBC: 3.79 Mil/uL — ABNORMAL LOW (ref 3.87–5.11)
RDW: 14.1 % (ref 11.5–15.5)
WBC: 7.9 10*3/uL (ref 4.0–10.5)

## 2013-07-16 LAB — SEDIMENTATION RATE: SED RATE: 21 mm/h (ref 0–22)

## 2013-07-16 LAB — C-REACTIVE PROTEIN: CRP: 0.9 mg/dL (ref 0.5–20.0)

## 2013-07-16 MED ORDER — PREDNISONE 10 MG PO TABS: ORAL_TABLET | ORAL | Status: DC

## 2013-07-16 NOTE — Telephone Encounter (Signed)
LMTCB

## 2013-07-16 NOTE — Telephone Encounter (Signed)
Spoke with the pt and advised of recs. Rx sent to [facility] and lab orders placed. [place] Bing, CMA

## 2013-07-16 NOTE — Telephone Encounter (Signed)
Pt just seen yesterday 07/15/13 by Adonis Housekeeper: Patient Instructions     Suggest Allegra / fexofenadine 180 1 daily  Take Zantac twice daily  Depo 80  You can also take occasional benadryl if needed  If this doesn't get better soon we will draw some labs and try other meds.    Rash has spread since yesterday to backs of legs/knees, face, wrist, and now has "red raised skin" -- was told to call today if no better.  Pt stated she called [facility] b/c she just recently began filling her meds there and had noticed her Lamictal looked different ie from a different manufacuter (filled this about 2.5wks ago).  Pt stated that her [facility] pharmacist will contact her previous [facility] to check with them and see if there is any difference in the "binding of the medication."  Pt has been following CY's recs to take the Zantac twice daily and Benadryl prn with no relief - she had already been taking Allegra.  Dr Annamaria Boots please advise, thank you.

## 2013-07-16 NOTE — Telephone Encounter (Signed)
duplicate

## 2013-07-16 NOTE — Telephone Encounter (Signed)
Order- lab- Allergy profile, Food IgE profile, Sed rate, CRP, CMET, CBC w diff      Dx urticaria  Offer prednisone taper 10 mg, # 20, 4 X 2 DAYS, 3 X 2 DAYS, 2 X 2 DAYS, 1 X 2 DAYS

## 2013-07-17 LAB — ALLERGY FULL PROFILE
Allergen, D pternoyssinus,d7: <0.1 kU/L
Allergen,Goose feathers, e70: <0.1 kU/L
Aspergillus fumigatus, m3: 0.13 kU/L — ABNORMAL HIGH
Bahia Grass: <0.1 kU/L
Bermuda Grass: <0.1 kU/L
Candida Albicans: <0.1 kU/L
Common Ragweed: <0.1 kU/L
D. farinae: <0.1 kU/L
Dog Dander: <0.1 kU/L
ELM IGE: 0.12 kU/L — AB
Goldenrod: <0.1 kU/L
Helminthosporium halodes: <0.1 kU/L
House Dust Hollister: <0.1 kU/L
Lamb's Quarters: <0.1 kU/L
Oak: <0.1 kU/L
Plantain: <0.1 kU/L
Stemphylium Botryosum: <0.1 kU/L
Timothy Grass: <0.1 kU/L

## 2013-07-17 LAB — ALLERGEN FOOD PROFILE SPECIFIC IGE
Apple: <0.1 kU/L
CORN: 0.26 kU/L — AB
Chicken IgE: 0.15 kU/L — ABNORMAL HIGH
Egg White IgE: 0.27 kU/L — ABNORMAL HIGH
FISH COD: 0.75 kU/L — AB
IGE (IMMUNOGLOBULIN E), SERUM: 146.9 [IU]/mL (ref 0.0–180.0)
Milk IgE: <0.1 kU/L
Orange: <0.1 kU/L
PEANUT IGE: 0.87 kU/L — AB
Shrimp IgE: <0.1 kU/L
Tomato IgE: <0.1 kU/L
Tuna IgE: <0.1 kU/L
WHEAT IGE: 0.41 kU/L — AB

## 2013-07-20 ENCOUNTER — Telehealth: Payer: Self-pay | Admitting: Internal Medicine

## 2013-07-20 NOTE — Telephone Encounter (Signed)
Spoke with the pt and notified of recs per CDY  She verbalized understanding  Nothing further needed 

## 2013-07-20 NOTE — Telephone Encounter (Signed)
            Result Notes    Notes Recorded by Deneise Lever, MD on 07/18/2013 at 8:41 PM Allergy antibodies elevated for several foods, a mold and oak pollen. Not sure if any are related to the itching. We will discuss at next ov.   Spoke with patient-states that the itching is ongoing and wakes her up-even raises up red bumps on her skin; ice packs on the site helps relieve this(numbs the area). This is happening for about 4 weeks now and patient is unsure what to do next or where to go. Pt has OV with CY in 10-2013 and feels that she should not wait this long. CY please advise the next step in care for patient. Thanks.

## 2013-07-20 NOTE — Telephone Encounter (Signed)
Suggest- skip allergy shots and any medicines she thinks she can do without for 2 weeks. See what happens. Meanwhile, go ahead and make a dermatology appointment, to get on the list. It can be cancelled later if not needed.

## 2013-07-22 DIAGNOSIS — L500 Allergic urticaria: Secondary | ICD-10-CM | POA: Diagnosis not present

## 2013-07-22 DIAGNOSIS — L738 Other specified follicular disorders: Secondary | ICD-10-CM | POA: Diagnosis not present

## 2013-07-22 DIAGNOSIS — L259 Unspecified contact dermatitis, unspecified cause: Secondary | ICD-10-CM | POA: Diagnosis not present

## 2013-07-29 DIAGNOSIS — M79609 Pain in unspecified limb: Secondary | ICD-10-CM | POA: Diagnosis not present

## 2013-07-29 DIAGNOSIS — I8310 Varicose veins of unspecified lower extremity with inflammation: Secondary | ICD-10-CM | POA: Diagnosis not present

## 2013-08-13 ENCOUNTER — Telehealth: Payer: Self-pay | Admitting: Family Medicine

## 2013-08-13 ENCOUNTER — Encounter: Payer: Self-pay | Admitting: Physician Assistant

## 2013-08-13 ENCOUNTER — Ambulatory Visit: Payer: Medicare Other | Admitting: Physician Assistant

## 2013-08-13 ENCOUNTER — Ambulatory Visit (INDEPENDENT_AMBULATORY_CARE_PROVIDER_SITE_OTHER): Payer: Medicare Other | Admitting: Physician Assistant

## 2013-08-13 VITALS — BP 126/88 | HR 61 | Temp 98.3°F | Resp 16 | Ht 59.0 in | Wt 208.5 lb

## 2013-08-13 DIAGNOSIS — H65199 Other acute nonsuppurative otitis media, unspecified ear: Secondary | ICD-10-CM | POA: Diagnosis not present

## 2013-08-13 DIAGNOSIS — M7981 Nontraumatic hematoma of soft tissue: Secondary | ICD-10-CM | POA: Diagnosis not present

## 2013-08-13 DIAGNOSIS — H65192 Other acute nonsuppurative otitis media, left ear: Secondary | ICD-10-CM

## 2013-08-13 DIAGNOSIS — I8310 Varicose veins of unspecified lower extremity with inflammation: Secondary | ICD-10-CM | POA: Diagnosis not present

## 2013-08-13 DIAGNOSIS — M79609 Pain in unspecified limb: Secondary | ICD-10-CM | POA: Diagnosis not present

## 2013-08-13 MED ORDER — AZITHROMYCIN 250 MG PO TABS: ORAL_TABLET | ORAL | Status: DC

## 2013-08-13 NOTE — Assessment & Plan Note (Signed)
Likely fluid is causing dizziness and subsequent mild headache.  Rx Azithromycin.  Flonase daily.  Continue Zyrtec.  Use Klonopin to help with dizziness if needed.

## 2013-08-13 NOTE — Progress Notes (Signed)
Pre visit review using our clinic review tool, if applicable. No additional management support is needed unless otherwise documented below in the visit note/SLS  

## 2013-08-13 NOTE — Patient Instructions (Signed)
Please take antibiotic as directed.  Increase fluid intake.  Take FLonase daily. Restart your Klonopin.  Call or return to clinic if symptoms are not improving.

## 2013-08-13 NOTE — Progress Notes (Signed)
Patient presents to clinic today c/o right ear fullness with pressure and discomfort, associated with mild dizziness.  Patient endorses occasional mild headache and nausea 2/2 dizziness.  Dizziness only present intermittently, lasting for a few seconds.  Patient does endorse some nasal congestion but denies other URI symptoms.  Denies photophobia, phonophobia or vision change.   Past Medical History  Diagnosis Date  . Obesity   . Esophageal reflux   . Fibromyalgia   . Asthma   . Allergy     rhinitis  . Sleep apnea, obstructive   . Bronchitis, acute 10/31/2010  . Otitis media of both ears 11/16/2010  . Depression with anxiety 01/08/2011  . Back pain 01/08/2011  . Sinusitis 06/11/2011  . Fatigue 06/11/2011  . Knee pain, right 09/20/2011  . Otitis media 09/20/2011  . Varicose veins of lower limb 10/02/2011  . Otitis media of left ear 09/19/2010  . Motion sickness 12/24/2011  . Sinusitis 12/24/2011  . Nausea & vomiting 12/24/2011  . Heart murmur   . Panic attacks   . Arthritis 02-01-12    osteoarthritis, Back pain, spine surgeries ? retain hardware cervial and  lumbar.  . Anemia 02-01-12    iron absorption problem  . Cancer 02-01-12    squamous cell -face  . Allergic state 09/21/2010    Current Outpatient Prescriptions on File Prior to Visit  Medication Sig Dispense Refill  . ALPRAZolam (XANAX) 0.25 MG tablet Take 0.25 mg by mouth daily as needed. For anxiety      . aspirin 81 MG tablet Take 81 mg by mouth daily.      [name] atorvastatin (LIPITOR) 10 MG tablet Take 10 mg by mouth daily.      . bifidobacterium infantis (ALIGN) capsule Take 1 capsule by mouth daily.       . calcitRIOL (ROCALTROL) 0.5 MCG capsule Take 0.5 mcg by mouth daily.       . DULoxetine (CYMBALTA) 60 MG capsule Take 60 mg by mouth every morning.       . methylphenidate (RITALIN) 10 MG tablet Take 10 mg by mouth 2 (two) times daily.      . NON FORMULARY once a week.       . NON FORMULARY CPAP- set on [employer].      .  ranitidine (ZANTAC) 150 MG tablet 1 tablet qam      . traZODone (DESYREL) 50 MG tablet Take 50 mg by mouth at bedtime.       . VESICARE 10 MG tablet take 1 tablet by mouth once daily  30 tablet  3   No current facility-administered medications on file prior to visit.    Allergies  Allergen Reactions  . Hydrocodone      nausea and vomiting and headaches.  . Cephalexin      Neuropathy  . Codeine     Crazy in the head, bp drops  . Levofloxacin Itching  . Meperidine Hcl     B/P drops  . Morphine And Related      States" extreme migraines"  . Oxycodone-Acetaminophen Other (See Comments)    Crazy in head, drop in bp  . Sulfonamide Derivatives Nausea And Vomiting    Stomach upset  . Adhesive [Tape] Rash    Latex in adhesive tape. Please use paper tape  . Penicillins Nausea And Vomiting and Rash    Family History  Problem Relation Age of Onset  . Alcohol abuse Mother   . Other Mother  accidental med overdose  . Emphysema Mother     smoked  . Bipolar disorder Mother   . Other Father     CHF  . Diabetes Father   . Cancer Father     throat ca/ smoked  . Alcohol abuse Father   . Stroke Father   . Hypertension Brother   . Hyperlipidemia Brother   . Obesity Brother   . Heart attack Maternal Grandfather   . Heart attack Paternal Grandmother   . Heart attack Paternal Grandfather   . Arthritis Son     psoriatic  . Arthritis Son     psoriatic  . Obesity Son     History   Social History  . Marital Status: Married    Spouse Name: N/A    Number of Children: N/A  . Years of Education: N/A   Social History Main Topics  . Smoking status: Never Smoker   . Smokeless tobacco: Never Used  . Alcohol Use: No     Comment: special occasions  . Drug Use: No  . Sexual Activity: Yes   Other Topics Concern  . None   Social History Narrative  . None   Review of Systems - See HPI.  All other ROS are negative.  BP 126/88  Pulse 61  Temp(Src) 98.3 F (36.8 C) (Oral)   Resp 16  Ht 4\' 11"  (1.499 m)  Wt 208 lb 8 oz (94.575 kg)  BMI 42.09 kg/m2  SpO2 95%  Physical Exam  Vitals reviewed. Constitutional: She is oriented to person, place, and time and well-developed, well-nourished, and in no distress.  HENT:  Head: Normocephalic and atraumatic.  Right Ear: External ear and ear canal normal. Tympanic membrane is erythematous and retracted. A middle ear effusion is present.  Left Ear: External ear and ear canal normal. Tympanic membrane is retracted. Tympanic membrane is not erythematous.  No middle ear effusion.  Eyes: Conjunctivae and EOM are normal. Pupils are equal, round, and reactive to light.  Neck: Neck supple.  Cardiovascular: Normal rate, regular rhythm, normal heart sounds and intact distal pulses.   Pulmonary/Chest: Effort normal and breath sounds normal. No respiratory distress. She has no wheezes. She has no rales. She exhibits no tenderness.  Lymphadenopathy:    She has no cervical adenopathy.  Neurological: She is alert and oriented to person, place, and time.  Skin: Skin is warm and dry. No rash noted.  Psychiatric: Affect normal.    Recent Results (from the past 2160 hour(s))  HEMOGLOBIN A1C     Status: None   Collection Time    06/01/13  7:45 AM      Result Value Ref Range   Hemoglobin A1C 5.7  4.6 - 6.5 %   Comment: Glycemic Control Guidelines for People with Diabetes:Non Diabetic:  <6%Goal of Therapy: <7%Additional Action Suggested:  >8%   LIPID PANEL     Status: None   Collection Time    06/01/13  7:45 AM      Result Value Ref Range   Cholesterol 114  0 - 200 mg/dL   Comment: ATP III Classification       Desirable:  < 200 mg/dL               Borderline High:  200 - 239 mg/dL          High:  > = 240 mg/dL   Triglycerides 59.0  0.0 - 149.0 mg/dL   Comment: Normal:  <150 mg/dLBorderline High:  150 -  199 mg/dL   HDL 53.10  >39.00 mg/dL   VLDL 11.8  0.0 - 40.0 mg/dL   LDL Cholesterol 49  0 - 99 mg/dL   Total CHOL/HDL Ratio 2      Comment:                Men          Women1/2 Average Risk     3.4          3.3Average Risk          5.0          4.42X Average Risk          9.6          7.13X Average Risk          15.0          11.0                      RENAL FUNCTION PANEL     Status: None   Collection Time    06/01/13  7:45 AM      Result Value Ref Range   Sodium 142  135 - 145 mEq/L   Potassium 4.2  3.5 - 5.1 mEq/L   Chloride 106  96 - 112 mEq/L   CO2 28  19 - 32 mEq/L   Calcium 8.7  8.4 - 10.5 mg/dL   Albumin 3.7  3.5 - 5.2 g/dL   BUN 21  6 - 23 mg/dL   Creatinine, Ser 0.7  0.4 - 1.2 mg/dL   Glucose, Bld 84  70 - 99 mg/dL   Phosphorus 4.4  2.3 - 4.6 mg/dL   GFR 88.90  >60.00 mL/min  CBC     Status: None   Collection Time    06/01/13  7:45 AM      Result Value Ref Range   WBC 7.1  4.0 - 10.5 K/uL   RBC 4.09  3.87 - 5.11 Mil/uL   Platelets 242.0  150.0 - 400.0 K/uL   Hemoglobin 12.4  12.0 - 15.0 g/dL   HCT 37.8  36.0 - 46.0 %   MCV 92.4  78.0 - 100.0 fl   MCHC 32.9  30.0 - 36.0 g/dL   RDW 14.4  11.5 - 15.5 %  TSH     Status: None   Collection Time    06/01/13  7:45 AM      Result Value Ref Range   TSH 1.38  0.35 - 4.50 uIU/mL  HEPATIC FUNCTION PANEL     Status: None   Collection Time    06/01/13  7:45 AM      Result Value Ref Range   Total Bilirubin 0.8  0.2 - 1.2 mg/dL   Bilirubin, Direct 0.1  0.0 - 0.3 mg/dL   Alkaline Phosphatase 108  39 - 117 U/L   AST 19  0 - 37 U/L   ALT 17  0 - 35 U/L   Total Protein 6.5  6.0 - 8.3 g/dL   Albumin 3.7  3.5 - 5.2 g/dL  ALLERGY FULL PROFILE     Status: Abnormal   Collection Time    07/16/13  4:51 PM      Result Value Ref Range   Allergen, D pternoyssinus,d7 <0.10     D. farinae <0.10     Cat Dander <0.10     Dog Dander <0.10     Goose Feathers <0.10     [place]  Grass <0.10     Fescue <0.10     G005 Rye, Perennial <0.10     Timothy Grass <0.10     G009 Red Top <0.10     Congo Grass <0.10     House Dust Hollister <0.10     Aspergillus fumigatus, m3  0.13 (*)    Alternaria Alternata <0.10     Helminthosporium halodes <0.10     Stemphylium Botryosum <0.10     Candida Albicans <0.10     Curvularia lunata <0.10     Box Elder IgE <0.10     Oak <0.10     Elm IgE 0.12 (*)    Sycamore Tree <0.10     Common Ragweed <0.10     Plantain <0.10     Lamb's Quarters <0.10     Goldenrod <0.10     Comment:            ----------------------------------------------------          Class   Specific IgE (kU/L)   Level          ----------------------------------------------------          0       <0.10                 Absent or undetectable          0/1     0.10  -   0.34        Equivocal/Borderline          1       0.35  -   0.69        Low          2       0.70  -   3.49        Moderate          3       3.50  -  17.49        High          4       17.50 -  49.99        Very High          5       50.00 - 100.00        Very High          6       >100.00               Very High  ALLERGEN FOOD PROFILE SPECIFIC IGE     Status: Abnormal   Collection Time    07/16/13  4:51 PM      Result Value Ref Range   Egg White IgE 0.27 (*)    Milk IgE <0.10     Fish Cod 0.75 (*)    Wheat IgE 0.41 (*)    Peanut IgE 0.87 (*)    Soybean IgE <0.10     Corn 0.26 (*)    Tomato IgE <0.10     Orange <0.10     Chicken IgE 0.15 (*)    Apple <0.10     Shrimp IgE <0.10     Tuna IgE <0.10     IgE (Immunoglobulin E), Serum 146.9  0.0 - 180.0 IU/mL   Comment:            ----------------------------------------------------          Class   Specific IgE (kU/L)   Level          ----------------------------------------------------  0       <0.10                 Absent or undetectable          0/1     0.10  -   0.34        Equivocal/Borderline          1       0.35  -   0.69        Low          2       0.70  -   3.49        Moderate          3       3.50  -  17.49        High          4       17.50 -  49.99        Very High          5       50.00 - 100.00         Very High          6       >100.00               Very High  SEDIMENTATION RATE     Status: None   Collection Time    07/16/13  4:51 PM      Result Value Ref Range   Sed Rate 21  0 - 22 mm/hr  C-REACTIVE PROTEIN     Status: None   Collection Time    07/16/13  4:51 PM      Result Value Ref Range   CRP 0.9  0.5 - 20.0 mg/dL  CBC WITH DIFFERENTIAL     Status: Abnormal   Collection Time    07/16/13  4:51 PM      Result Value Ref Range   WBC 7.9  4.0 - 10.5 K/uL   RBC 3.79 (*) 3.87 - 5.11 Mil/uL   Hemoglobin 11.6 (*) 12.0 - 15.0 g/dL   HCT 34.7 (*) 36.0 - 46.0 %   MCV 91.6  78.0 - 100.0 fl   MCHC 33.5  30.0 - 36.0 g/dL   RDW 14.1  11.5 - 15.5 %   Platelets 232.0  150.0 - 400.0 K/uL   Neutrophils Relative % 74.5  43.0 - 77.0 %   Lymphocytes Relative 15.8  12.0 - 46.0 %   Monocytes Relative 5.9  3.0 - 12.0 %   Eosinophils Relative 3.5  0.0 - 5.0 %   Basophils Relative 0.3  0.0 - 3.0 %   Neutro Abs 5.9  1.4 - 7.7 K/uL   Lymphs Abs 1.2  0.7 - 4.0 K/uL   Monocytes Absolute 0.5  0.1 - 1.0 K/uL   Eosinophils Absolute 0.3  0.0 - 0.7 K/uL   Basophils Absolute 0.0  0.0 - 0.1 K/uL    Assessment/Plan: Otitis media of left ear Likely fluid is causing dizziness and subsequent mild headache.  Rx Azithromycin.  Flonase daily.  Continue Zyrtec.  Use Klonopin to help with dizziness if needed.

## 2013-08-13 NOTE — Telephone Encounter (Signed)
Pt sch for acute visit today  Did not come

## 2013-08-13 NOTE — Telephone Encounter (Signed)
ERROR-patient was overlooked in lobby [after signing in] until after 12:00pm; was was seen immediately afterward/SLS

## 2013-09-14 ENCOUNTER — Other Ambulatory Visit: Payer: Self-pay | Admitting: Family Medicine

## 2013-09-14 NOTE — Telephone Encounter (Signed)
Rx sent to pharmacy. LDM 

## 2013-09-24 DIAGNOSIS — I6529 Occlusion and stenosis of unspecified carotid artery: Secondary | ICD-10-CM | POA: Diagnosis not present

## 2013-10-03 DIAGNOSIS — L299 Pruritus, unspecified: Secondary | ICD-10-CM | POA: Insufficient documentation

## 2013-10-03 NOTE — Assessment & Plan Note (Signed)
Continued emphasis on good sleep hygiene and appropriate naps

## 2013-10-03 NOTE — Assessment & Plan Note (Signed)
Nonspecific pruritus. Hopefully with symptom control, able resolve soon Plan-continue Zantac, Allegra, Depo-Medrol

## 2013-10-03 NOTE — Assessment & Plan Note (Signed)
Good compliance and control with no changes needed. Weight loss encouraged

## 2013-10-05 DIAGNOSIS — M7981 Nontraumatic hematoma of soft tissue: Secondary | ICD-10-CM | POA: Diagnosis not present

## 2013-10-05 DIAGNOSIS — I8310 Varicose veins of unspecified lower extremity with inflammation: Secondary | ICD-10-CM | POA: Diagnosis not present

## 2013-10-05 DIAGNOSIS — M79609 Pain in unspecified limb: Secondary | ICD-10-CM | POA: Diagnosis not present

## 2013-10-13 ENCOUNTER — Other Ambulatory Visit: Payer: Self-pay | Admitting: Family Medicine

## 2013-10-21 DIAGNOSIS — I6529 Occlusion and stenosis of unspecified carotid artery: Secondary | ICD-10-CM | POA: Diagnosis not present

## 2013-10-21 DIAGNOSIS — R0602 Shortness of breath: Secondary | ICD-10-CM | POA: Diagnosis not present

## 2013-10-21 DIAGNOSIS — I359 Nonrheumatic aortic valve disorder, unspecified: Secondary | ICD-10-CM | POA: Diagnosis not present

## 2013-10-21 DIAGNOSIS — R42 Dizziness and giddiness: Secondary | ICD-10-CM | POA: Diagnosis not present

## 2013-10-22 DIAGNOSIS — Z23 Encounter for immunization: Secondary | ICD-10-CM | POA: Diagnosis not present

## 2013-10-26 ENCOUNTER — Ambulatory Visit: Payer: Medicare Other | Admitting: Family Medicine

## 2013-11-12 ENCOUNTER — Ambulatory Visit: Payer: Medicare Other | Admitting: Internal Medicine

## 2013-11-16 ENCOUNTER — Other Ambulatory Visit: Payer: Self-pay | Admitting: Family Medicine

## 2013-11-17 ENCOUNTER — Encounter: Payer: Self-pay | Admitting: Physician Assistant

## 2013-11-17 ENCOUNTER — Ambulatory Visit (INDEPENDENT_AMBULATORY_CARE_PROVIDER_SITE_OTHER): Payer: Medicare Other | Admitting: Physician Assistant

## 2013-11-17 VITALS — BP 154/44 | HR 55 | Temp 98.5°F | Wt 213.2 lb

## 2013-11-17 DIAGNOSIS — Z23 Encounter for immunization: Secondary | ICD-10-CM | POA: Diagnosis not present

## 2013-11-17 DIAGNOSIS — M722 Plantar fascial fibromatosis: Secondary | ICD-10-CM | POA: Insufficient documentation

## 2013-11-17 NOTE — Progress Notes (Signed)
Patient presents to clinic today c/o pain in the left heel x 2 months, becoming more intense over the past 2 weeks.  Denies trauma or injury.  Worst with first step in the morning.  Wears supportive shoes most of the time.  Denies history of similar pain.  There is some pain with weightbearing. Has taken some Advil with little relief of symptoms.  Patient requesting vaccination booster for pertussis as there is a new baby in the family.  Past Medical History  Diagnosis Date  . Obesity   . Esophageal reflux   . Fibromyalgia   . Asthma   . Allergy     rhinitis  . Sleep apnea, obstructive   . Bronchitis, acute 10/31/2010  . Otitis media of both ears 11/16/2010  . Depression with anxiety 01/08/2011  . Back pain 01/08/2011  . Sinusitis 06/11/2011  . Fatigue 06/11/2011  . Knee pain, right 09/20/2011  . Otitis media 09/20/2011  . Varicose veins of lower limb 10/02/2011  . Otitis media of left ear 09/19/2010  . Motion sickness 12/24/2011  . Sinusitis 12/24/2011  . Nausea & vomiting 12/24/2011  . Heart murmur   . Panic attacks   . Arthritis 02-01-12    osteoarthritis, Back pain, spine surgeries ? retain hardware cervial and  lumbar.  . Anemia 02-01-12    iron absorption problem  . Cancer 02-01-12    squamous cell -face  . Allergic state 09/21/2010    Current Outpatient Prescriptions on File Prior to Visit  Medication Sig Dispense Refill  . ALPRAZolam (XANAX) 0.25 MG tablet Take 0.25 mg by mouth daily as needed. For anxiety      . aspirin 81 MG tablet Take 81 mg by mouth daily.      [name] atorvastatin (LIPITOR) 10 MG tablet Take 10 mg by mouth daily.      . bifidobacterium infantis (ALIGN) capsule Take 1 capsule by mouth daily.       . calcitRIOL (ROCALTROL) 0.5 MCG capsule Take 0.5 mcg by mouth daily.       . DULoxetine (CYMBALTA) 60 MG capsule Take 60 mg by mouth every morning.       . methylphenidate (RITALIN) 10 MG tablet Take 10 mg by mouth 2 (two) times daily.      . NON FORMULARY once a  week.       . NON FORMULARY CPAP- set on [employer].      . ranitidine (ZANTAC) 150 MG tablet 1 tablet qam      . traZODone (DESYREL) 50 MG tablet Take 50 mg by mouth at bedtime.       . VESICARE 10 MG tablet TAKE ONE TABLET BY MOUTH ONCE DAILY  30 tablet  5  . azithromycin (ZITHROMAX) 250 MG tablet Take 2 tablets on Day 1.  Then take 1 tablet daily.  6 tablet  0   No current facility-administered medications on file prior to visit.    Allergies  Allergen Reactions  . Hydrocodone      nausea and vomiting and headaches.  . Cephalexin      Neuropathy  . Codeine     Crazy in the head, bp drops  . Levofloxacin Itching  . Meperidine Hcl     B/P drops  . Morphine And Related      States" extreme migraines"  . Oxycodone-Acetaminophen Other (See Comments)    Crazy in head, drop in bp  . Sulfonamide Derivatives Nausea And Vomiting    Stomach upset  .  Adhesive [Tape] Rash    Latex in adhesive tape. Please use paper tape  . Penicillins Nausea And Vomiting and Rash    Family History  Problem Relation Age of Onset  . Alcohol abuse Mother   . Other Mother     accidental med overdose  . Emphysema Mother     smoked  . Bipolar disorder Mother   . Other Father     CHF  . Diabetes Father   . Cancer Father     throat ca/ smoked  . Alcohol abuse Father   . Stroke Father   . Hypertension Brother   . Hyperlipidemia Brother   . Obesity Brother   . Heart attack Maternal Grandfather   . Heart attack Paternal Grandmother   . Heart attack Paternal Grandfather   . Arthritis Son     psoriatic  . Arthritis Son     psoriatic  . Obesity Son     History   Social History  . Marital Status: Married    Spouse Name: N/A    Number of Children: N/A  . Years of Education: N/A   Social History Main Topics  . Smoking status: Never Smoker   . Smokeless tobacco: Never Used  . Alcohol Use: No     Comment: special occasions  . Drug Use: No  . Sexual Activity: Yes   Other Topics Concern  .  None   Social History Narrative  . None   Review of Systems - See HPI.  All other ROS are negative.  BP 154/44  Pulse 55  Temp(Src) 98.5 F (36.9 C) (Oral)  Wt 213 lb 3.2 oz (96.707 kg)  SpO2 100%  Physical Exam  Vitals reviewed. Constitutional: She is oriented to person, place, and time and well-developed, well-nourished, and in no distress.  HENT:  Head: Normocephalic and atraumatic.  Eyes: Conjunctivae are normal.  Neck: Neck supple.  Cardiovascular: Normal rate, regular rhythm, normal heart sounds and intact distal pulses.   Pulmonary/Chest: Effort normal and breath sounds normal. No respiratory distress. She has no wheezes. She has no rales. She exhibits no tenderness.  Musculoskeletal:  Tenderness of heel and plantar foot with palpation. No change in ROM.  Extremity is neurovascularly intact.  Neurological: She is alert and oriented to person, place, and time.  Skin: Skin is warm and dry. No rash noted.  Psychiatric: Affect normal.   Assessment/Plan: Plantar fasciitis of left foot Ibuprofen.  RICE. Frozen can roll under feet as directed. Supportive shows.  Stretching exercises given.  Follow-up if no improvement.

## 2013-11-17 NOTE — Patient Instructions (Signed)
Take Ibuprofen as directed.  Apply ACE wrap to foot.  Roll ice can under foot.  Wear supportive shoes. Try exercises below to help alleviate pain.   Plantar Fasciitis (Heel Spur Syndrome) with Rehab The plantar fascia is a fibrous, ligament-like, soft-tissue structure that spans the bottom of the foot. Plantar fasciitis is a condition that causes pain in the foot due to inflammation of the tissue. SYMPTOMS   Pain and tenderness on the underneath side of the foot.  Pain that worsens with standing or walking. CAUSES  Plantar fasciitis is caused by irritation and injury to the plantar fascia on the underneath side of the foot. Common mechanisms of injury include:  Direct trauma to bottom of the foot.  Damage to a small nerve that runs under the foot where the main fascia attaches to the heel bone.  Stress placed on the plantar fascia due to bone spurs. RISK INCREASES WITH:   Activities that place stress on the plantar fascia (running, jumping, pivoting, or cutting).  Poor strength and flexibility.  Improperly fitted shoes.  Tight calf muscles.  Flat feet.  Failure to warm-up properly before activity.  Obesity. PREVENTION  Warm up and stretch properly before activity.  Allow for adequate recovery between workouts.  Maintain physical fitness:  Strength, flexibility, and endurance.  Cardiovascular fitness.  Maintain a health body weight.  Avoid stress on the plantar fascia.  Wear properly fitted shoes, including arch supports for individuals who have flat feet. PROGNOSIS  If treated properly, then the symptoms of plantar fasciitis usually resolve without surgery. However, occasionally surgery is necessary. RELATED COMPLICATIONS   Recurrent symptoms that may result in a chronic condition.  Problems of the lower back that are caused by compensating for the injury, such as limping.  Pain or weakness of the foot during push-off following surgery.  Chronic  inflammation, scarring, and partial or complete fascia tear, occurring more often from repeated injections. TREATMENT  Treatment initially involves the use of ice and medication to help reduce pain and inflammation. The use of strengthening and stretching exercises may help reduce pain with activity, especially stretches of the Achilles tendon. These exercises may be performed at home or with a therapist. Your caregiver may recommend that you use heel cups of arch supports to help reduce stress on the plantar fascia. Occasionally, corticosteroid injections are given to reduce inflammation. If symptoms persist for greater than 6 months despite non-surgical (conservative), then surgery may be recommended.  MEDICATION   If pain medication is necessary, then nonsteroidal anti-inflammatory medications, such as aspirin and ibuprofen, or other minor pain relievers, such as acetaminophen, are often recommended.  Do not take pain medication within 7 days before surgery.  Prescription pain relievers may be given if deemed necessary by your caregiver. Use only as directed and only as much as you need.  Corticosteroid injections may be given by your caregiver. These injections should be reserved for the most serious cases, because they may only be given a certain number of times. HEAT AND COLD  Cold treatment (icing) relieves pain and reduces inflammation. Cold treatment should be applied for 10 to 15 minutes every 2 to 3 hours for inflammation and pain and immediately after any activity that aggravates your symptoms. Use ice packs or massage the area with a piece of ice (ice massage).  Heat treatment may be used prior to performing the stretching and strengthening activities prescribed by your caregiver, physical therapist, or athletic trainer. Use a heat pack or soak the  injury in warm water. SEEK IMMEDIATE MEDICAL CARE IF:  Treatment seems to offer no benefit, or the condition worsens.  Any medications  produce adverse side effects. EXERCISES RANGE OF MOTION (ROM) AND STRETCHING EXERCISES - Plantar Fasciitis (Heel Spur Syndrome) These exercises may help you when beginning to rehabilitate your injury. Your symptoms may resolve with or without further involvement from your physician, physical therapist or athletic trainer. While completing these exercises, remember:   Restoring tissue flexibility helps normal motion to return to the joints. This allows healthier, less painful movement and activity.  An effective stretch should be held for at least 30 seconds.  A stretch should never be painful. You should only feel a gentle lengthening or release in the stretched tissue. RANGE OF MOTION - Toe Extension, Flexion  Sit with your right / left leg crossed over your opposite knee.  Grasp your toes and gently pull them back toward the top of your foot. You should feel a stretch on the bottom of your toes and/or foot.  Hold this stretch for __________ seconds.  Now, gently pull your toes toward the bottom of your foot. You should feel a stretch on the top of your toes and or foot.  Hold this stretch for __________ seconds. Repeat __________ times. Complete this stretch __________ times per day.  RANGE OF MOTION - Ankle Dorsiflexion, Active Assisted  Remove shoes and sit on a chair that is preferably not on a carpeted surface.  Place right / left foot under knee. Extend your opposite leg for support.  Keeping your heel down, slide your right / left foot back toward the chair until you feel a stretch at your ankle or calf. If you do not feel a stretch, slide your bottom forward to the edge of the chair, while still keeping your heel down.  Hold this stretch for __________ seconds. Repeat __________ times. Complete this stretch __________ times per day.  STRETCH - Gastroc, Standing  Place hands on wall.  Extend right / left leg, keeping the front knee somewhat bent.  Slightly point your  toes inward on your back foot.  Keeping your right / left heel on the floor and your knee straight, shift your weight toward the wall, not allowing your back to arch.  You should feel a gentle stretch in the right / left calf. Hold this position for __________ seconds. Repeat __________ times. Complete this stretch __________ times per day. STRETCH - Soleus, Standing  Place hands on wall.  Extend right / left leg, keeping the other knee somewhat bent.  Slightly point your toes inward on your back foot.  Keep your right / left heel on the floor, bend your back knee, and slightly shift your weight over the back leg so that you feel a gentle stretch deep in your back calf.  Hold this position for __________ seconds. Repeat __________ times. Complete this stretch __________ times per day. STRETCH - Gastrocsoleus, Standing  Note: This exercise can place a lot of stress on your foot and ankle. Please complete this exercise only if specifically instructed by your caregiver.   Place the ball of your right / left foot on a step, keeping your other foot firmly on the same step.  Hold on to the wall or a rail for balance.  Slowly lift your other foot, allowing your body weight to press your heel down over the edge of the step.  You should feel a stretch in your right / left calf.  Hold  this position for __________ seconds.  Repeat this exercise with a slight bend in your right / left knee. Repeat __________ times. Complete this stretch __________ times per day.  STRENGTHENING EXERCISES - Plantar Fasciitis (Heel Spur Syndrome)  These exercises may help you when beginning to rehabilitate your injury. They may resolve your symptoms with or without further involvement from your physician, physical therapist or athletic trainer. While completing these exercises, remember:   Muscles can gain both the endurance and the strength needed for everyday activities through controlled  exercises.  Complete these exercises as instructed by your physician, physical therapist or athletic trainer. Progress the resistance and repetitions only as guided. STRENGTH - Towel Curls  Sit in a chair positioned on a non-carpeted surface.  Place your foot on a towel, keeping your heel on the floor.  Pull the towel toward your heel by only curling your toes. Keep your heel on the floor.  If instructed by your physician, physical therapist or athletic trainer, add ____________________ at the end of the towel. Repeat __________ times. Complete this exercise __________ times per day. STRENGTH - Ankle Inversion  Secure one end of a rubber exercise band/tubing to a fixed object (table, pole). Loop the other end around your foot just before your toes.  Place your fists between your knees. This will focus your strengthening at your ankle.  Slowly, pull your big toe up and in, making sure the band/tubing is positioned to resist the entire motion.  Hold this position for __________ seconds.  Have your muscles resist the band/tubing as it slowly pulls your foot back to the starting position. Repeat __________ times. Complete this exercises __________ times per day.  Document Released: 01/08/2005 Document Revised: 04/02/2011 Document Reviewed: 04/22/2008 [facility] Patient Information 2015 [place], [place]. This information is not intended to replace advice given to you by your health care provider. Make sure you discuss any questions you have with your health care provider.

## 2013-11-17 NOTE — Addendum Note (Signed)
Addended by: Harl Bowie on: 11/17/2013 05:11 PM   Modules accepted: Orders

## 2013-11-17 NOTE — Assessment & Plan Note (Signed)
Ibuprofen.  RICE. Frozen can roll under feet as directed. Supportive shows.  Stretching exercises given.  Follow-up if no improvement.

## 2013-11-17 NOTE — Progress Notes (Signed)
Pre visit review using our clinic review tool, if applicable. No additional management support is needed unless otherwise documented below in the visit note. 

## 2013-11-25 ENCOUNTER — Ambulatory Visit (INDEPENDENT_AMBULATORY_CARE_PROVIDER_SITE_OTHER): Payer: Medicare Other

## 2013-11-25 DIAGNOSIS — J309 Allergic rhinitis, unspecified: Secondary | ICD-10-CM

## 2013-12-04 ENCOUNTER — Encounter: Payer: Self-pay | Admitting: Internal Medicine

## 2014-01-11 ENCOUNTER — Encounter: Payer: Self-pay | Admitting: Internal Medicine

## 2014-01-11 ENCOUNTER — Ambulatory Visit (INDEPENDENT_AMBULATORY_CARE_PROVIDER_SITE_OTHER): Payer: Medicare Other | Admitting: Internal Medicine

## 2014-01-11 VITALS — BP 120/70 | HR 63 | Ht 59.0 in | Wt 212.8 lb

## 2014-01-11 DIAGNOSIS — J309 Allergic rhinitis, unspecified: Secondary | ICD-10-CM | POA: Diagnosis not present

## 2014-01-11 DIAGNOSIS — J302 Other seasonal allergic rhinitis: Secondary | ICD-10-CM

## 2014-01-11 DIAGNOSIS — G4733 Obstructive sleep apnea (adult) (pediatric): Secondary | ICD-10-CM

## 2014-01-11 DIAGNOSIS — J3089 Other allergic rhinitis: Secondary | ICD-10-CM

## 2014-01-11 DIAGNOSIS — H1010 Acute atopic conjunctivitis, unspecified eye: Secondary | ICD-10-CM | POA: Diagnosis not present

## 2014-01-11 MED ORDER — TRAZODONE HCL 100 MG PO TABS: ORAL_TABLET | ORAL | Status: DC

## 2014-01-11 NOTE — Patient Instructions (Addendum)
Script sent refilling trazodone  Order- DME [facility]- replacement for worn out CPAP machine, 13 cwp , mask of choice, supplies, humidifier     Dx OSA  We can continue Allergy vaccine 1:10 GO  Please call as needed

## 2014-01-11 NOTE — Assessment & Plan Note (Signed)
Okay to continue allergy vaccine 1:10 GO as discussed

## 2014-01-11 NOTE — Progress Notes (Signed)
Subjective:    Patient ID: [name], female    DOB: 10/02/1945, 68 y.o.   MRN: [id number]  HPI 09/21/10- 68 year old female never smoker followed for allergic rhinitis, asthma, OSA, complicated by GERD, DM, HBP, obesity. Last here August 08, 2009 CPAP 12- Able to wear it all night, comfortable now. . Colostomy (colon nicked in a bladder sling procedure) was reversed. Had to revise the wound for staph and incidentally had a squamous cell skin cancer removed. Had iron infusion for anemia. No major respiratory problems through the past year. For the past 2-3 weeks has had burning and tearing of eyes with some postnasal drip, raspy throat.  Daily xyzal, patanase, singulair and ipratropium eye nasal spray..  Not aware of drafts into eye. Tried optivar, visine. Denies glaucoma.  08/02/11- 68 year old female never smoker followed for allergic rhinitis, asthma, OSA, complicated by GERD, DM, HBP, obesity. Still on vaccine, cough -productive-light yellow in color(finished Zpak today; both ears painful(full),and nasal drainage. Still using CPAP every night  and will get with DME for mask options. Bronchitis x5 days with dark yellow sputum. Color clearing with Z-Pak. Admits wheezing and frontal sinus pressure without headache, fever, sore throat or grossly purulent sputum.  11/07/12- 68 year old female never smoker followed for allergic rhinitis, asthma, OSA, complicated by GERD, DM, HBP, obesity. FOLLOWS FOR: still on allergy vaccine and no reactions; cough x 6 weeks(has had 2 rds of abx and prednisone)Finished the prednisone monday. NPSG 03/23/1999- AHI 156/ hr, weight 202lbs before bariatric surgery, CPAP recently 12. [facility] screen 07/24/12- AHI 2.0/ hr, weight 209lbs.  Bronchitis/sinusitis responded in July to a Z-Pak and prednisone from her primary physician. She then relapsed and a second round of Z-Pak and prednisone ended last week. Persistent dry hacking cough increased by  activity and temperature changes. She is using Advair Singulair and Tessalon. She says she can tolerate Tussionex but not other hydrocodone products.  12/25/12-68 year old female never smoker followed for allergic rhinitis, asthma, OSA, complicated by GERD, DM, HBP, obesity. Allergy vaccine 1:10 GO FOLLOWS FOR: still on vaccine and doing well; discuss ordering full NPSG Cough is finally better. Blames postnasal drip despite Patanase and ipratropium. No sinus pressure. Taking clonazepam 1 mg at bedtime for leg jerks. Ritalin only if can't stay awake at work. Always tired and thinks her CPAP is not working. Interested in update full sleep study to reevaluate. CXR 11/07/12 IMPRESSION:  No acute cardiopulmonary process. Left midlung atelectasis and or  scarring.  Electronically Signed  By: Lovey Newcomer M.D.  On: 11/07/2012 17:12  02/03/13-68 year old female never smoker followed for allergic rhinitis, asthma, OSA, complicated by GERD, DM, HBP, obesity. Allergy vaccine 1:10 GO FOLLOWS FOR: review sleep study and MSLT with patient; patient is having a cough since Friday-was tested for flu-neg. Acute bronchitis- Dr Randel Pigg yesterday gave neb, albuterol inhaler, Zpak. Prednisone taper was not started. OSA- has continued CPAP 13/[facility]. NPSG 01/25/13- AHI 7.6/ hr, mild OSA, weight 213 lbs. MSLT 01/26/13- mean latency 0.9 minutes, SOREM 1/5 naps. Pathologic daytime sleepiness consistent with narcolepsy or idiopathic, but nonspecific because she had taken Klonopin and Lamictal the night before, Cymbalta and Xyzal the day of the test. CXR 02/02/13 IMPRESSION:  Findings suggestive of bronchitis. No focal infiltrate to suggest  pneumonia identified.  Electronically Signed  By: Jeannine Boga M.D.  On: 02/02/2013 17:52  05/13/13- 68 yoF never smoker followed for allergic rhinitis, asthma, OSA/ Narcolepsy , complicated by GERD, DM, HBP, obesity/ pace,maker. OSA- has continued CPAP 13/[facility]  Allergy  vaccine 1:10 GO Presents for- runny nose, sinus congestion, vocal changes, right ear pain X5 days.  pt has an infant grandchild in the hospital, wants to be sure she is not contagious.   Cough started after being outside- pollen. No sick exposure. Eyes watering.  Yellow, frontal HA, cough hurts when she coughs. Hurts behind angle R jaw- eustachian distribution. She says she can tolerate tussionex occasionally, otherwise avoids narcotics.  07/15/13- 68 yoF never smoker followed for allergic rhinitis, asthma, OSA/ Narcolepsy , complicated by GERD, DM, HBP, obesity/ pacemaker. ACUTE VISIT:  C/O:  itching from head to toe over the past week.  Reports skin is raw from itching so much Generalized pruritus over the past week with no similar experience in the past. No visible rash. Does not recognize any change of exposure. Has not been outside much. Already on Zantac plus Allegra and continues allergy vaccine 1:10 GO CPAP 13/ [facility]- doing fine  01/10/14- 68 yoF never smoker followed for allergic rhinitis, asthma, OSA/ Narcolepsy , complicated by GERD, DM, HBP, obesity/ pacemaker. FOLLOWS FOR:  needs new rx for cpap 13/ [facility].  old is not fixable. has decided that she is allergic to advil.  still taking her allergy vaccine 1:10 GO.  Does well with trazodone 50 mg for sleep most nights, sometimes 100 mg. [facility] and needs prescription for replacement of her worn CPAP machine. She uses it all night every night and pressure seems good.  Review of Systems-see HPI  Constitutional:   No-   weight loss, night sweats, fevers, chills, +fatigue, lassitude. HEENT:   No-  headaches, difficulty swallowing, tooth/dental problems, sore throat,       No-  sneezing, +itching, +ear ache, + nasal congestion, +post nasal drip,  CV:  No-   chest pain, orthopnea, PND, swelling in lower extremities, anasarca, dizziness, palpitations Resp: No-   shortness of breath with exertion or at rest.              No- productive  cough,  + non-productive cough,  No-  coughing up of blood.              No-   change in color of mucus.  No- wheezing.   Skin: No-   rash or lesions. GI:  No-   heartburn, indigestion, abdominal pain, nausea, vomiting, GU:  MS:  No-   joint pain or swelling.   Neuro- nothing unusual Psych:  No- change in mood or affect. No depression or anxiety.  No memory loss.    Objective:   Physical Exam General- Alert, Oriented, Affect-appropriate, Distress- none acute  Very overweight Skin- rash-none, lesions- none, excoriation-none Lymphadenopathy- none Head- atraumatic            Eyes- Gross vision intact, PERRLA, conjunctivae clear, with watery thin secretions            Ears- Hearing, canals normal, TMs normal            Nose- Clear, No- Septal dev, mucus, polyps, erosion, perforation,             Throat- Mallampati III , mucosa clear , drainage- none, tonsils- atrophic Neck- flexible , trachea midline, no stridor , thyroid nl, carotid no bruit Chest - symmetrical excursion , unlabored           Heart/CV- RRR , +trace systolic murmur? TI or AS , no gallop  , no rub, nl s1  s2                           - JVD- none , edema- none, stasis changes- none, varices- right calf           Lung-  Clear chest, Cough-none, wheeze- none , dullness-none, rub- none           Chest wall-  Abd-  Br/ Gen/ Rectal- Not done, not indicated Extrem- cyanosis- none, clubbing, none, atrophy- none, strength- nl Neuro- grossly intact to observation  Assessment & Plan:

## 2014-01-11 NOTE — Assessment & Plan Note (Signed)
Good compliance and control with CPAP 13. Old machine is worn out, but does don't work right. Plan-replacement CPAP machine keeping pressure at 13

## 2014-01-25 ENCOUNTER — Other Ambulatory Visit: Payer: Self-pay | Admitting: *Deleted

## 2014-01-25 MED ORDER — CALCITRIOL 0.5 MCG PO CAPS: 0.5000 ug | ORAL_CAPSULE | Freq: Every day | ORAL | Status: DC

## 2014-01-25 NOTE — Telephone Encounter (Signed)
Calcitriol refilled. Pt scheduled appt. JG//CMA

## 2014-02-16 ENCOUNTER — Ambulatory Visit (INDEPENDENT_AMBULATORY_CARE_PROVIDER_SITE_OTHER): Payer: Medicare Other | Admitting: Family Medicine

## 2014-02-16 ENCOUNTER — Encounter: Payer: Self-pay | Admitting: Family Medicine

## 2014-02-16 VITALS — BP 124/76 | HR 59 | Temp 98.0°F | Resp 18 | Ht 59.0 in | Wt 209.0 lb

## 2014-02-16 DIAGNOSIS — M79672 Pain in left foot: Secondary | ICD-10-CM | POA: Diagnosis not present

## 2014-02-16 DIAGNOSIS — E669 Obesity, unspecified: Secondary | ICD-10-CM

## 2014-02-16 DIAGNOSIS — M722 Plantar fascial fibromatosis: Secondary | ICD-10-CM

## 2014-02-16 DIAGNOSIS — D649 Anemia, unspecified: Secondary | ICD-10-CM

## 2014-02-16 DIAGNOSIS — E782 Mixed hyperlipidemia: Secondary | ICD-10-CM | POA: Diagnosis not present

## 2014-02-16 DIAGNOSIS — E119 Type 2 diabetes mellitus without complications: Secondary | ICD-10-CM

## 2014-02-16 DIAGNOSIS — Z6841 Body Mass Index (BMI) 40.0 and over, adult: Secondary | ICD-10-CM | POA: Diagnosis not present

## 2014-02-16 DIAGNOSIS — E785 Hyperlipidemia, unspecified: Secondary | ICD-10-CM | POA: Diagnosis not present

## 2014-02-16 DIAGNOSIS — E1169 Type 2 diabetes mellitus with other specified complication: Secondary | ICD-10-CM

## 2014-02-16 DIAGNOSIS — F418 Other specified anxiety disorders: Secondary | ICD-10-CM

## 2014-02-16 DIAGNOSIS — G4733 Obstructive sleep apnea (adult) (pediatric): Secondary | ICD-10-CM | POA: Diagnosis not present

## 2014-02-16 DIAGNOSIS — I10 Essential (primary) hypertension: Secondary | ICD-10-CM | POA: Diagnosis not present

## 2014-02-16 LAB — LIPID PANEL
CHOL/HDL RATIO: 2
CHOLESTEROL: 108 mg/dL (ref 0–200)
HDL: 46.3 mg/dL (ref >39.00)
LDL Cholesterol: 49 mg/dL (ref 0–99)
NonHDL: 61.7
Triglycerides: 65 mg/dL (ref 0.0–149.0)
VLDL: 13 mg/dL (ref 0.0–40.0)

## 2014-02-16 LAB — HEMOGLOBIN A1C: Hgb A1c MFr Bld: 5.9 % (ref 4.6–6.5)

## 2014-02-16 LAB — COMPREHENSIVE METABOLIC PANEL
ALT: 13 U/L (ref 0–35)
AST: 15 U/L (ref 0–37)
Albumin: 3.9 g/dL (ref 3.5–5.2)
Alkaline Phosphatase: 105 U/L (ref 39–117)
BUN: 15 mg/dL (ref 6–23)
CO2: 27 mEq/L (ref 19–32)
Calcium: 8.8 mg/dL (ref 8.4–10.5)
Chloride: 107 mEq/L (ref 96–112)
Creatinine, Ser: 0.79 mg/dL (ref 0.40–1.20)
GFR: 77.15 mL/min (ref >60.00)
GLUCOSE: 91 mg/dL (ref 70–99)
Potassium: 3.8 mEq/L (ref 3.5–5.1)
Sodium: 141 mEq/L (ref 135–145)
Total Bilirubin: 0.6 mg/dL (ref 0.2–1.2)
Total Protein: 6.5 g/dL (ref 6.0–8.3)

## 2014-02-16 LAB — CBC
HCT: 36.9 % (ref 36.0–46.0)
Hemoglobin: 12.3 g/dL (ref 12.0–15.0)
MCHC: 33.4 g/dL (ref 30.0–36.0)
MCV: 90.1 fl (ref 78.0–100.0)
Platelets: 220 10*3/uL (ref 150.0–400.0)
RBC: 4.09 Mil/uL (ref 3.87–5.11)
RDW: 14.2 % (ref 11.5–15.5)
WBC: 5.9 10*3/uL (ref 4.0–10.5)

## 2014-02-16 LAB — TSH: TSH: 2.76 u[IU]/mL (ref 0.35–4.50)

## 2014-02-16 LAB — FERRITIN: FERRITIN: 18.6 ng/mL (ref 10.0–291.0)

## 2014-02-16 MED ORDER — TRAZODONE HCL 100 MG PO TABS: ORAL_TABLET | ORAL | Status: DC

## 2014-02-16 MED ORDER — DULOXETINE HCL 60 MG PO CPEP: 60.0000 mg | ORAL_CAPSULE | Freq: Every day | ORAL | Status: DC

## 2014-02-16 MED ORDER — AZITHROMYCIN 250 MG PO TABS: ORAL_TABLET | ORAL | Status: DC

## 2014-02-16 NOTE — Assessment & Plan Note (Signed)
Well controlled, no changes to meds. Encouraged heart healthy diet such as the DASH diet and exercise as tolerated.  °

## 2014-02-16 NOTE — Patient Instructions (Addendum)
Start Curcumen/Turmeric caps daily Ice and aspercreme twice daily   [employer], Elderberry, Zinc liquid Mucinex twice daily  Achilles Tendinitis Achilles tendinitis is inflammation of the tough, cord-like band that attaches the lower muscles of your leg to your heel (Achilles tendon). It is usually caused by overusing the tendon and joint involved.  CAUSES Achilles tendinitis can happen because of:  A sudden increase in exercise or activity (such as running).  Doing the same exercises or activities (such as jumping) over and over.  Not warming up calf muscles before exercising.  Exercising in shoes that are worn out or not made for exercise.  Having arthritis or a bone growth on the back of the heel bone. This can rub against the tendon and hurt the tendon. SIGNS AND SYMPTOMS The most common symptoms are:  Pain in the back of the leg, just above the heel. The pain usually gets worse with exercise and better with rest.  Stiffness or soreness in the back of the leg, especially in the morning.  Swelling of the skin over the Achilles tendon.  Trouble standing on tiptoe. Sometimes, an Achilles tendon tears (ruptures). Symptoms of an Achilles tendon rupture can include:  Sudden, severe pain in the back of the leg.  Trouble putting weight on the foot or walking normally. DIAGNOSIS Achilles tendinitis will be diagnosed based on symptoms and a physical examination. An X-ray may be done to check if another condition is causing your symptoms. An MRI may be ordered if your health care provider suspects you may have completely torn your tendon, which is called an Achilles tendon rupture.  TREATMENT  Achilles tendinitis usually gets better over time. It can take weeks to months to heal completely. Treatment focuses on treating the symptoms and helping the injury heal. HOME CARE INSTRUCTIONS   Rest your Achilles tendon and avoid activities that cause pain.  Apply ice  to the injured area:  Put ice in a plastic bag.  Place a towel between your skin and the bag.  Leave the ice on for 20 minutes, 2-3 times a day  Try to avoid using the tendon (other than gentle range of motion) while the tendon is painful. Do not resume use until instructed by your health care provider. Then begin use gradually. Do not increase use to the point of pain. If pain does develop, decrease use and continue the above measures. Gradually increase activities that do not cause discomfort until you achieve normal use.  Do exercises to make your calf muscles stronger and more flexible. Your health care provider or physical therapist can recommend exercises for you to do.  Wrap your ankle with an elastic bandage or other wrap. This can help keep your tendon from moving too much. Your health care provider will show you how to wrap your ankle correctly.  Only take over-the-counter or prescription medicines for pain, discomfort, or fever as directed by your health care provider. SEEK MEDICAL CARE IF:   Your pain and swelling increase or pain is uncontrolled with medicines.  You develop new, unexplained symptoms or your symptoms get worse.  You are unable to move your toes or foot.  You develop warmth and swelling in your foot.  You have an unexplained temperature. MAKE SURE YOU:   Understand these instructions.  Will watch your condition.  Will get help right away if you are not doing well or get worse. Document Released: 10/18/2004 Document Revised: 10/29/2012 Document Reviewed: 08/20/2012 [employer] Patient Information 2015  [employer]. This information is not intended to replace advice given to you by your health care provider. Make sure you discuss any questions you have with your health care provider.

## 2014-02-16 NOTE — Progress Notes (Signed)
Pre visit review using our clinic review tool, if applicable. No additional management support is needed unless otherwise documented below in the visit note. 

## 2014-02-18 DIAGNOSIS — L821 Other seborrheic keratosis: Secondary | ICD-10-CM | POA: Diagnosis not present

## 2014-02-18 DIAGNOSIS — L570 Actinic keratosis: Secondary | ICD-10-CM | POA: Diagnosis not present

## 2014-02-18 DIAGNOSIS — Z85828 Personal history of other malignant neoplasm of skin: Secondary | ICD-10-CM | POA: Diagnosis not present

## 2014-02-18 DIAGNOSIS — L409 Psoriasis, unspecified: Secondary | ICD-10-CM | POA: Diagnosis not present

## 2014-02-18 DIAGNOSIS — L820 Inflamed seborrheic keratosis: Secondary | ICD-10-CM | POA: Diagnosis not present

## 2014-02-19 ENCOUNTER — Encounter: Payer: Self-pay | Admitting: *Deleted

## 2014-02-23 ENCOUNTER — Encounter: Payer: Self-pay | Admitting: Family Medicine

## 2014-02-23 ENCOUNTER — Ambulatory Visit (INDEPENDENT_AMBULATORY_CARE_PROVIDER_SITE_OTHER): Payer: Medicare Other | Admitting: Family Medicine

## 2014-02-23 ENCOUNTER — Other Ambulatory Visit (INDEPENDENT_AMBULATORY_CARE_PROVIDER_SITE_OTHER): Payer: Medicare Other

## 2014-02-23 VITALS — BP 122/82 | HR 56 | Ht 59.0 in | Wt 206.0 lb

## 2014-02-23 DIAGNOSIS — M79672 Pain in left foot: Secondary | ICD-10-CM

## 2014-02-23 DIAGNOSIS — M7752 Other enthesopathy of left foot: Secondary | ICD-10-CM

## 2014-02-23 DIAGNOSIS — M2170 Unequal limb length (acquired), unspecified site: Secondary | ICD-10-CM

## 2014-02-23 DIAGNOSIS — M7750 Other enthesopathy of unspecified foot: Secondary | ICD-10-CM | POA: Insufficient documentation

## 2014-02-23 MED ORDER — VITAMIN D (ERGOCALCIFEROL) 1.25 MG (50000 UNIT) PO CAPS: 50000.0000 [IU] | ORAL_CAPSULE | ORAL | Status: DC

## 2014-02-23 NOTE — Assessment & Plan Note (Signed)
Patient does have more of a posterior capsulitis. Discussed icing protocol and home exercises. We discussed in great detail with the advice trainer on proper technique. Patient will do and icing protocol and patient was given some topical anti-inflammatories a try. We are also addressing patient's leg length discrepancy. He should also given a pneumatic compression brace to see if this will be helpful. Patient will try these changes and come back and see me again in 3 weeks for further evaluation.

## 2014-02-23 NOTE — Patient Instructions (Addendum)
Very nice to meet you Ice bath 10 minutes at night.  Heel lift in shoe.  Exercises 3 times a week.  Try the pennsaid twice daily as needed.  Vitamin D 50000 IU daily.  Turmeric 500mg  twice daily.  See me again in 3 weeks.

## 2014-02-23 NOTE — Progress Notes (Signed)
[name] [place], [place] [address] Phone: [phone] Subjective:    I'm seeing this patient by the request  of:  BLYTH, Erline Levine, MD   CC: Left heel pain  GEX:[id number] [name] is a 69 y.o. female coming in with complaint of left heel pain. Patient states that she has had this pain for quite some time. Patient has seen another provider and was diagnosed with Achilles tendinosis. Patient states that she's been doing the exercises fairly frequently states that she has not made any significant improvement. Patient states this does seem to have worsened since she had a knee replacement last year on the contralateral knee. Patient denies any numbness. States that sometimes the first steps in the morning can be worse. Patient describes the pain as a sharp pain followed by dull aching sensation. Patient states that the pain at night can be sometimes unbearable. Patient rates the severity of pain a 7 out of 10. Patient unable to take anti-inflammatories secondary to her history of allergy to Advil.     Past medical history, social, surgical and family history all reviewed in electronic medical record.   Review of Systems: No headache, visual changes, nausea, vomiting, diarrhea, constipation, dizziness, abdominal pain, skin rash, fevers, chills, night sweats, weight loss, swollen lymph nodes, body aches, joint swelling, muscle aches, chest pain, shortness of breath, mood changes.   Objective Blood pressure 122/82, pulse 56, height 4\' 11"  (1.499 m), weight 206 lb (93.441 kg), SpO2 96 %.  General: No apparent distress alert and oriented x3 mood and affect normal, dressed appropriately.  HEENT: Pupils equal, extraocular movements intact  Respiratory: Patient's speak in full sentences and does not appear short of breath  Cardiovascular: No lower extremity edema, non tender, no erythema  Skin: Warm dry intact with no signs of infection or rash on  extremities or on axial skeleton.  Abdomen: Soft nontender  Neuro: Cranial nerves II through XII are intact, neurovascularly intact in all extremities with 2+ DTRs and 2+ pulses.  Lymph: No lymphadenopathy of posterior or anterior cervical chain or axillae bilaterally.  Gait antalgic gait MSK:  Non tender with full range of motion and good stability and symmetric strength and tone of shoulders, elbows, wrist, hip, knee and ankles bilaterally.  Patient does have a leg length discrepancy with left leg being quarter inch shorter than the right Ankle: Left No visible erythema or swelling. Patient is having mild breakdown of the longitudinal and transverse arch Range of motion is full in all directions. Strength is 5/5 in all directions. Stable lateral and medial ligaments; squeeze test and kleiger test unremarkable; Talar dome nontender; No pain at base of 5th MT; No tenderness over cuboid; No tenderness over N spot or navicular prominence No tenderness on posterior aspects of lateral and medial malleolus No sign of peroneal tendon subluxations or tenderness to palpation Negative tarsal tunnel tinel's Able to walk 4 steps. Patient is somewhat tender over the medial calcaneal region where the plantar fascia inserts as well as at the insertion of the Achilles. Contralateral ankle unremarkable  MSK US performed of: Left ankle This study was ordered, performed, and interpreted by Charlann Boxer D.O.  Foot/Ankle:   All structures visualized.   Talar dome unremarkable  Ankle mortise without effusion. Peroneus longus and brevis tendons unremarkable on long and transverse views without sheath effusions. Posterior tibialis, flexor hallucis longus, and flexor digitorum longus tendons unremarkable on long and transverse views without sheath effusions.  Achilles tendon visualized with very minimal hypoechoic changes. Anterior Talofibular Ligament and Calcaneofibular Ligaments unremarkable and  intact. Deltoid Ligament unremarkable and intact. Plantar fascia has some mild hypoechoic changes as well. Power doppler signal normal.  IMPRESSION:  Mild Achilles tendinosis and plantar fasciitis with posterior capsulitis  Procedure note 76734; 15 minutes spent for Therapeutic exercises as stated in above notes.  This included exercises focusing on stretching, strengthening, with significant focus on eccentric aspects.   Proper technique shown and discussed handout in great detail with ATC.  All questions were discussed and answered.       Impression and Recommendations:     This case required medical decision making of moderate complexity.

## 2014-02-23 NOTE — Progress Notes (Signed)
Pre visit review using our clinic review tool, if applicable. No additional management support is needed unless otherwise documented below in the visit note. 

## 2014-02-28 NOTE — Assessment & Plan Note (Signed)
Patient with persistent pain despite conservative measures is referred to sports med for further consideration. Continue icing, stretching and encouraged topical treatments.

## 2014-02-28 NOTE — Progress Notes (Signed)
[name]  [id number] [birth year] 02/28/2014      Progress Note-Follow Up  Subjective  Chief Complaint  Chief Complaint  Patient presents with  . Foot Pain    left heel pain  . sinus drainage    no cough,   . Headache    HPI  Patient is a 69 y.o. female in today for routine medical care. Patient in today for follow-up with numerous concerns. One is of worsening left heel pain. She has been diagnosed with plantar fasciitis and has been using the conservative treatment including icing and stretching and good shoes. Unfortunately her pain persists and is on the bottom of her feet as well as her posterior leg. She denies any trauma right before this worsened. No other acute complaint. She continues to struggle with congestion and fatigue. She continues to struggle with fatigue, malaise and depression but feels she manages fairly well with current medications. Denies CP/palp/SOB/HA/congestion/fevers/GI or GU c/o. Taking meds as prescribed  Past Medical History  Diagnosis Date  . Obesity   . Esophageal reflux   . Fibromyalgia   . Asthma   . Allergy     rhinitis  . Sleep apnea, obstructive   . Bronchitis, acute 10/31/2010  . Otitis media of both ears 11/16/2010  . Depression with anxiety 01/08/2011  . Back pain 01/08/2011  . Sinusitis 06/11/2011  . Fatigue 06/11/2011  . Knee pain, right 09/20/2011  . Otitis media 09/20/2011  . Varicose veins of lower limb 10/02/2011  . Otitis media of left ear 09/19/2010  . Motion sickness 12/24/2011  . Sinusitis 12/24/2011  . Nausea & vomiting 12/24/2011  . Heart murmur   . Panic attacks   . Arthritis 02-01-12    osteoarthritis, Back pain, spine surgeries ? retain hardware cervial and  lumbar.  . Anemia 02-01-12    iron absorption problem  . Cancer 02-01-12    squamous cell -face  . Allergic state 09/21/2010    Past Surgical History  Procedure Laterality Date  . Bariatric surgery    . Lumbar disc surgery    . Bladder suspension     complicated by bowel nick/ clolostomy  . Gallbladder surgery  1978  . Appendectomy  1962  . Gastric bypass  2003    Patient also noted "gastric bypass resection - 2004"  . Cervical disc surgery      C-7 in 2004, C-4 and C-5 in 2010  . Colostomy bag      Confirm with patient. Listed under medical conditions on form dated 07/18/09.  [name]      Per medical history form dated 07/18/09.  [name] Hernia repair  2009    Two hernias and abdominal reconstruction  . Ivc filter      prophyllactically- no hx DVT  . Carpal tunnel release  02-01-12    right  . Colostomy reversal  02-01-12  . Abdominal surgery  02-01-12    ABDOMINAL SURGERY  . Vulva surgery  02-01-12    cyst removal-pt 8 months pregnant  . Total knee arthroplasty  02/11/2012    Procedure: TOTAL KNEE ARTHROPLASTY;  Surgeon: Mauri Pole, MD;  Location: [facility];  Service: Orthopedics;  Laterality: Right;    Family History  Problem Relation Age of Onset  . Alcohol abuse Mother   . Other Mother     accidental med overdose  . Emphysema Mother     smoked  . Bipolar disorder Mother   . Other Father  CHF  . Diabetes Father   . Cancer Father     throat ca/ smoked  . Alcohol abuse Father   . Stroke Father   . Hypertension Brother   . Hyperlipidemia Brother   . Obesity Brother   . Heart attack Maternal Grandfather   . Heart attack Paternal Grandmother   . Heart attack Paternal Grandfather   . Arthritis Son     psoriatic  . Arthritis Son     psoriatic  . Obesity Son     History   Social History  . Marital Status: Married    Spouse Name: N/A    Number of Children: N/A  . Years of Education: N/A   Occupational History  . Not on file.   Social History Main Topics  . Smoking status: Never Smoker   . Smokeless tobacco: Never Used  . Alcohol Use: No     Comment: special occasions  . Drug Use: No  . Sexual Activity: Yes   Other Topics Concern  . Not on file   Social History Narrative    Current Outpatient  Prescriptions on File Prior to Visit  Medication Sig Dispense Refill  . ALPRAZolam (XANAX) 0.25 MG tablet Take 0.25 mg by mouth daily as needed. For anxiety    . aspirin 81 MG tablet Take 81 mg by mouth daily.    [name] atorvastatin (LIPITOR) 10 MG tablet Take 10 mg by mouth daily.    . bifidobacterium infantis (ALIGN) capsule Take 1 capsule by mouth daily.     . DULoxetine (CYMBALTA) 60 MG capsule Take 60 mg by mouth every morning.     . methylphenidate (RITALIN) 10 MG tablet Take 10 mg by mouth 2 (two) times daily.    . NON FORMULARY once a week.     . NON FORMULARY CPAP- set on [employer].    . ranitidine (ZANTAC) 150 MG tablet 1 tablet qam    . VESICARE 10 MG tablet TAKE ONE TABLET BY MOUTH ONCE DAILY 30 tablet 5   No current facility-administered medications on file prior to visit.    Allergies  Allergen Reactions  . Hydrocodone      nausea and vomiting and headaches.  . Advil [Ibuprofen]     itching  . Cephalexin      Neuropathy  . Codeine     Crazy in the head, bp drops  . Levofloxacin Itching  . Meperidine Hcl     B/P drops  . Morphine And Related      States" extreme migraines"  . Oxycodone-Acetaminophen Other (See Comments)    Crazy in head, drop in bp  . Sulfonamide Derivatives Nausea And Vomiting    Stomach upset  . Adhesive [Tape] Rash    Latex in adhesive tape. Please use paper tape  . Penicillins Nausea And Vomiting and Rash    Review of Systems  Review of Systems  Constitutional: Positive for malaise/fatigue. Negative for fever.  HENT: Positive for congestion.   Eyes: Negative for discharge.  Respiratory: Negative for shortness of breath.   Cardiovascular: Negative for chest pain, palpitations and leg swelling.  Gastrointestinal: Negative for nausea, abdominal pain and diarrhea.  Genitourinary: Negative for dysuria.  Musculoskeletal: Positive for joint pain. Negative for falls.       Foot and heal pain  Skin: Negative for rash.  Neurological: Negative for  loss of consciousness and headaches.  Endo/Heme/Allergies: Negative for polydipsia.  Psychiatric/Behavioral: Positive for depression. Negative for suicidal ideas. The patient is  not nervous/anxious and does not have insomnia.     Objective  BP 124/76 mmHg  Pulse 59  Temp(Src) 98 F (36.7 C) (Oral)  Resp 18  Ht 4\' 11"  (1.499 m)  Wt 209 lb (94.802 kg)  BMI 42.19 kg/m2  SpO2 97%  Physical Exam  Physical Exam  Constitutional: She is oriented to person, place, and time and well-developed, well-nourished, and in no distress. No distress.  HENT:  Head: Normocephalic and atraumatic.  Eyes: Conjunctivae are normal.  Neck: Neck supple. No thyromegaly present.  Cardiovascular: Normal rate, regular rhythm and normal heart sounds.   No murmur heard. Pulmonary/Chest: Effort normal and breath sounds normal. She has no wheezes.  Abdominal: She exhibits no distension and no mass.  Musculoskeletal: She exhibits no edema.  Lymphadenopathy:    She has no cervical adenopathy.  Neurological: She is alert and oriented to person, place, and time.  Skin: Skin is warm and dry. No rash noted. She is not diaphoretic.  Psychiatric: Memory, affect and judgment normal.    Lab Results  Component Value Date   TSH 2.76 02/16/2014   Lab Results  Component Value Date   WBC 5.9 02/16/2014   HGB 12.3 02/16/2014   HCT 36.9 02/16/2014   MCV 90.1 02/16/2014   PLT 220.0 02/16/2014   Lab Results  Component Value Date   CREATININE 0.79 02/16/2014   BUN 15 02/16/2014   NA 141 02/16/2014   K 3.8 02/16/2014   CL 107 02/16/2014   CO2 27 02/16/2014   Lab Results  Component Value Date   ALT 13 02/16/2014   AST 15 02/16/2014   ALKPHOS 105 02/16/2014   BILITOT 0.6 02/16/2014   Lab Results  Component Value Date   CHOL 108 02/16/2014   Lab Results  Component Value Date   HDL 46.30 02/16/2014   Lab Results  Component Value Date   LDLCALC 49 02/16/2014   Lab Results  Component Value Date    TRIG 65.0 02/16/2014   Lab Results  Component Value Date   CHOLHDL 2 02/16/2014     Assessment & Plan  ESSENTIAL HYPERTENSION, BENIGN Well controlled, no changes to meds. Encouraged heart healthy diet such as the DASH diet and exercise as tolerated.    Obstructive sleep apnea Using CPAP routinely   Hyperlipidemia, mixed Encouraged heart healthy diet, increase exercise, avoid trans fats, consider a krill oil cap daily   Morbid obesity with BMI of 40.0-44.9, adult Encouraged DASH diet, decrease po intake and increase exercise as tolerated. Needs 7-8 hours of sleep nightly. Avoid trans fats, eat small, frequent meals every 4-5 hours with lean proteins, complex carbs and healthy fats. Minimize simple carbs   Depression with anxiety Doing fairly well on Sertraline, uses Alprazolam very infrequently. May continue the same   Plantar fasciitis of left foot Patient with persistent pain despite conservative measures is referred to sports med for further consideration. Continue icing, stretching and encouraged topical treatments.

## 2014-02-28 NOTE — Assessment & Plan Note (Signed)
Doing fairly well on Sertraline, uses Alprazolam very infrequently. May continue the same

## 2014-02-28 NOTE — Assessment & Plan Note (Signed)
Encouraged heart healthy diet, increase exercise, avoid trans fats, consider a krill oil cap daily 

## 2014-02-28 NOTE — Assessment & Plan Note (Signed)
Encouraged DASH diet, decrease po intake and increase exercise as tolerated. Needs 7-8 hours of sleep nightly. Avoid trans fats, eat small, frequent meals every 4-5 hours with lean proteins, complex carbs and healthy fats. Minimize simple carbs 

## 2014-02-28 NOTE — Assessment & Plan Note (Signed)
Using CPAP routinely 

## 2014-03-10 ENCOUNTER — Telehealth: Payer: Self-pay | Admitting: Family Medicine

## 2014-03-10 NOTE — Telephone Encounter (Signed)
Spoke to, scheduled her for 2.19.16 @ 3pm.

## 2014-03-10 NOTE — Telephone Encounter (Signed)
Pt called stated that her foot is not better since last time she saw Dr. Tamala Julian, Please advise on what to do because she has to keep her grandson this weekend.

## 2014-03-10 NOTE — Telephone Encounter (Signed)
Probably should see her.  We can consider injection.

## 2014-03-12 ENCOUNTER — Ambulatory Visit (INDEPENDENT_AMBULATORY_CARE_PROVIDER_SITE_OTHER): Payer: Medicare Other | Admitting: Family Medicine

## 2014-03-12 ENCOUNTER — Other Ambulatory Visit (INDEPENDENT_AMBULATORY_CARE_PROVIDER_SITE_OTHER): Payer: Medicare Other

## 2014-03-12 ENCOUNTER — Encounter: Payer: Self-pay | Admitting: Family Medicine

## 2014-03-12 VITALS — BP 124/78 | HR 75 | Ht 59.0 in | Wt 206.0 lb

## 2014-03-12 DIAGNOSIS — M722 Plantar fascial fibromatosis: Secondary | ICD-10-CM | POA: Diagnosis not present

## 2014-03-12 DIAGNOSIS — M79672 Pain in left foot: Secondary | ICD-10-CM | POA: Diagnosis not present

## 2014-03-12 MED ORDER — TRAMADOL HCL 50 MG PO TABS: 50.0000 mg | ORAL_TABLET | Freq: Every evening | ORAL | Status: DC | PRN

## 2014-03-12 NOTE — Progress Notes (Signed)
[name] [place], [place] [address] Phone: [phone] Subjective:    I'm seeing this patient by the request  of:  BLYTH, Erline Levine, MD   CC: Left heel pain  GMW:[id number] [name] is a 69 y.o. female coming in with complaint of left heel pain. Patient was seen previously and had more of a posterior capsulitis. Patient had somewhat of the Achilles tendinosis as well as a plantar fasciitis. Patient was to address her leg length discrepancy by getting a pneumatic compression brace, we discussed icing regimen as well as topical anti-inflammatories. Patient was given a prescription for vitamin D supplementation to see if this will help. Patient has been doing this for 2 weeks and not have noticed any significant improvement. Patient states she continues to have the discomfort. Seems to be more on the lateral aspect of the heel. Patient has been doing the exercises religiously as well as the icing. Patient states no improvement with the topical anti-inflammatories.     Past medical history, social, surgical and family history all reviewed in electronic medical record.   Review of Systems: No headache, visual changes, nausea, vomiting, diarrhea, constipation, dizziness, abdominal pain, skin rash, fevers, chills, night sweats, weight loss, swollen lymph nodes, body aches, joint swelling, muscle aches, chest pain, shortness of breath, mood changes.   Objective Blood pressure 124/78, pulse 75, height 4\' 11"  (1.499 m), weight 206 lb (93.441 kg), SpO2 94 %.  General: No apparent distress alert and oriented x3 mood and affect normal, dressed appropriately.  HEENT: Pupils equal, extraocular movements intact  Respiratory: Patient's speak in full sentences and does not appear short of breath  Cardiovascular: No lower extremity edema, non tender, no erythema  Skin: Warm dry intact with no signs of infection or rash on extremities or on axial skeleton.    Abdomen: Soft nontender  Neuro: Cranial nerves II through XII are intact, neurovascularly intact in all extremities with 2+ DTRs and 2+ pulses.  Lymph: No lymphadenopathy of posterior or anterior cervical chain or axillae bilaterally.  Gait antalgic gait MSK:  Non tender with full range of motion and good stability and symmetric strength and tone of shoulders, elbows, wrist, hip, knee and ankles bilaterally.  Patient does have a leg length discrepancy with left leg being quarter inch shorter than the right Ankle: Left No visible erythema or swelling. Patient is having mild breakdown of the longitudinal and transverse arch Range of motion is full in all directions. Strength is 5/5 in all directions. Stable lateral and medial ligaments; squeeze test and kleiger test unremarkable; Talar dome nontender; No pain at base of 5th MT; No tenderness over cuboid; No tenderness over N spot or navicular prominence No tenderness on posterior aspects of lateral and medial malleolus No sign of peroneal tendon subluxations or tenderness to palpation Negative tarsal tunnel tinel's Able to walk 4 steps. Patient is somewhat tender over the medial calcaneal region where the plantar fascia inserts as well as at the insertion of the Achilles. Contralateral ankle unremarkable  MSK US performed of: Left ankle This study was ordered, performed, and interpreted by Charlann Boxer D.O.  Foot/Ankle:   All structures visualized.   Talar dome unremarkable  Ankle mortise without effusion. Peroneus longus and brevis tendons unremarkable on long and transverse views without sheath effusions. Posterior tibialis, flexor hallucis longus, and flexor digitorum longus tendons unremarkable on long and transverse views without sheath effusions. Achilles tendon visualized with very minimal hypoechoic changes. Anterior  Talofibular Ligament and Calcaneofibular Ligaments unremarkable and intact. Deltoid Ligament unremarkable and  intact. Plantar fascia has some mild hypoechoic changes as well measure 0.97cm. Power doppler signal normal.  IMPRESSION:  Mild Achilles tendinosis with spur and plantar fasciitis with posterior capsulitis still present.    Procedure: Real-time Ultrasound Guided Injection of plantar fasciitis on left foot Device: GE Logiq E  Ultrasound guided injection is preferred based studies that show increased duration, increased effect, greater accuracy, decreased procedural pain, increased response rate, and decreased cost with ultrasound guided versus blind injection.  Verbal informed consent obtained.  Time-out conducted.  Noted no overlying erythema, induration, or other signs of local infection.  Skin prepped in a sterile fashion.  Local anesthesia: Topical Ethyl chloride.  With sterile technique and under real time ultrasound guidance: The 21-gauge 2 inch needle patient was injected with 0.5 mL of 0.5% Marcaine and 0.5 mL of Kenalog 40 mg/dL. This was done under ultrasound guidance   Completed without difficulty  Pain immediately resolved suggesting accurate placement of the medication.  Advised to call if fevers/chills, erythema, induration, drainage, or persistent bleeding.  Images permanently stored and available for review in the ultrasound unit.  Impression: Technically successful ultrasound guided injection.        Impression and Recommendations:     This case required medical decision making of moderate complexity.

## 2014-03-12 NOTE — Assessment & Plan Note (Signed)
Patient was given an injection today. Patient tolerated the procedure very well. We discussed icing regimen and home exercises. We discussed a Cam Walker and patient was fitted with this today. Patient then will come back and see me again in 2 weeks to make sure that she is improving. Patient continues to have discomfort we will need to consider further imaging.

## 2014-03-12 NOTE — Progress Notes (Signed)
Pre visit review using our clinic review tool, if applicable. No additional management support is needed unless otherwise documented below in the visit note. 

## 2014-03-12 NOTE — Patient Instructions (Addendum)
[name] [name] is your friend.  Have fun with your grandkid! Wear the boot for next 2 weeks.  Tramadol at night See me again in within 2 weeks.

## 2014-03-19 ENCOUNTER — Ambulatory Visit: Payer: Medicare Other | Admitting: Family Medicine

## 2014-03-26 ENCOUNTER — Ambulatory Visit (INDEPENDENT_AMBULATORY_CARE_PROVIDER_SITE_OTHER): Payer: Medicare Other | Admitting: Family Medicine

## 2014-03-26 ENCOUNTER — Encounter: Payer: Self-pay | Admitting: Family Medicine

## 2014-03-26 ENCOUNTER — Other Ambulatory Visit (INDEPENDENT_AMBULATORY_CARE_PROVIDER_SITE_OTHER): Payer: Medicare Other

## 2014-03-26 ENCOUNTER — Ambulatory Visit: Payer: Medicare Other | Admitting: Family Medicine

## 2014-03-26 VITALS — BP 124/82 | HR 63 | Ht 59.0 in | Wt 206.0 lb

## 2014-03-26 DIAGNOSIS — M7752 Other enthesopathy of left foot: Secondary | ICD-10-CM | POA: Diagnosis not present

## 2014-03-26 DIAGNOSIS — M79672 Pain in left foot: Secondary | ICD-10-CM | POA: Diagnosis not present

## 2014-03-26 NOTE — Progress Notes (Signed)
[name] [address], [place] [address] Phone: [phone] Subjective:    I'm seeing this patient by the request  of:  BLYTH, Erline Levine, MD   CC: Left heel pain  EUM:[id number] [name] is a 69 y.o. female coming in with complaint of left heel pain. Patient was seen previously and had more of a posterior capsulitis.. Patient was given a prescription for vitamin D supplementation to see if this will help. Patient actually had an injection within the plantar fascia and is doing significant better. Patient states that she can ambulate significantly better and is continuing to wear the boot. Patient denies any new symptoms. Some very minimal discomfort noted over the calcaneal region itself.     Past medical history, social, surgical and family history all reviewed in electronic medical record.   Review of Systems: No headache, visual changes, nausea, vomiting, diarrhea, constipation, dizziness, abdominal pain, skin rash, fevers, chills, night sweats, weight loss, swollen lymph nodes, body aches, joint swelling, muscle aches, chest pain, shortness of breath, mood changes.   Objective Blood pressure 124/82, pulse 63, height 4\' 11"  (1.499 m), weight 206 lb (93.441 kg), SpO2 95 %.  General: No apparent distress alert and oriented x3 mood and affect normal, dressed appropriately.  HEENT: Pupils equal, extraocular movements intact  Respiratory: Patient's speak in full sentences and does not appear short of breath  Cardiovascular: No lower extremity edema, non tender, no erythema  Skin: Warm dry intact with no signs of infection or rash on extremities or on axial skeleton.  Abdomen: Soft nontender  Neuro: Cranial nerves II through XII are intact, neurovascularly intact in all extremities with 2+ DTRs and 2+ pulses.  Lymph: No lymphadenopathy of posterior or anterior cervical chain or axillae bilaterally.  Gait antalgic gait MSK:  Non tender with full  range of motion and good stability and symmetric strength and tone of shoulders, elbows, wrist, hip, knee and ankles bilaterally.  Patient does have a leg length discrepancy with left leg being quarter inch shorter than the right Ankle: Left No visible erythema or swelling. Patient is having mild breakdown of the longitudinal and transverse arch Range of motion is full in all directions. Strength is 5/5 in all directions. Stable lateral and medial ligaments; squeeze test and kleiger test unremarkable; Talar dome nontender; No pain at base of 5th MT; No tenderness over cuboid; No tenderness over N spot or navicular prominence No tenderness on posterior aspects of lateral and medial malleolus No sign of peroneal tendon subluxations or tenderness to palpation Negative tarsal tunnel tinel's Able to walk 4 steps. Patient is minimal tender over the medial calcaneal region where the plantar fascia inserts as well as at the insertion of the Achilles which is significantly improved from previous exam. Contralateral ankle unremarkable  MSK US performed of: Left ankle This study was ordered, performed, and interpreted by Charlann Boxer D.O.  Foot/Ankle:   All structures visualized.   Talar dome unremarkable  Ankle mortise without effusion. Peroneus longus and brevis tendons unremarkable on long and transverse views without sheath effusions. Posterior tibialis, flexor hallucis longus, and flexor digitorum longus tendons unremarkable on long and transverse views without sheath effusions. Achilles tendon visualized with very minimal hypoechoic changes. Anterior Talofibular Ligament and Calcaneofibular Ligaments unremarkable and intact. Deltoid Ligament unremarkable and intact. Plantar fascia has some mild hypoechoic changes as well measure 0.67cm  From  0.97cm previous Power doppler signal normal.  IMPRESSION: Significant decreasing hypoechoic changes from  the posterior capsulitis          Impression and Recommendations:     This case required medical decision making of moderate complexity.

## 2014-03-26 NOTE — Patient Instructions (Signed)
[name] In 10 days can walk 2 times a week.  Can still bike, elliptical or swimmas much as you want OK to wear shoe now at home and work.  Still wear boot out and about for another 10 days 2 weeks we will get you orthotics See me again in 4 weeks.

## 2014-03-26 NOTE — Progress Notes (Signed)
Pre visit review using our clinic review tool, if applicable. No additional management support is needed unless otherwise documented below in the visit note. 

## 2014-03-26 NOTE — Assessment & Plan Note (Signed)
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 Patient did respond very well to the injection is improving. Patient is going to be set up for custom orthotics in the near future. We discussed continuing the icing and to transition into the shoes slowly over the course the next several days. Patient and will come back and see me again in 1 month to make sure that she is doing very well.

## 2014-03-30 DIAGNOSIS — I6529 Occlusion and stenosis of unspecified carotid artery: Secondary | ICD-10-CM | POA: Diagnosis not present

## 2014-04-09 ENCOUNTER — Ambulatory Visit (INDEPENDENT_AMBULATORY_CARE_PROVIDER_SITE_OTHER): Payer: Medicare Other | Admitting: Family Medicine

## 2014-04-09 ENCOUNTER — Encounter: Payer: Self-pay | Admitting: Family Medicine

## 2014-04-09 DIAGNOSIS — M722 Plantar fascial fibromatosis: Secondary | ICD-10-CM

## 2014-04-09 NOTE — Progress Notes (Signed)
Pre visit review using our clinic review tool, if applicable. No additional management support is needed unless otherwise documented below in the visit note. 

## 2014-04-09 NOTE — Progress Notes (Signed)
Patient was fitted for a : standard, cushioned, semi-rigid orthotic. The orthotic was heated and afterward the patient was in a seated position and the orthotic molded. The patient was positioned in subtalar neutral position and 10 degrees of ankle dorsiflexion in a non-weight bearing stance. After completion of molding, patient did have orthotic management which included instructions on acclimating to the orthotics, signs of ill fit as well as care for the orthotic.   The blank was ground to a stable position for weight bearing. Size: 6 ([employer])  Base: Carbon fiber Additional Posting and Padding: The following postings were fitted onto the molded orthotics to help maintain a talar neutral position - Wedge posting for transverse arch:  None    Silicone posting for longitudinal arch:  250/100   We also did a modified heel lift using her original shoe insole on her left side. I instructed her if this were causing any issues whatsoever to remove it.  The patient ambulated these, and they were very comfortable and supportive.

## 2014-04-09 NOTE — Assessment & Plan Note (Signed)
Patient made orthotics today. Please see patient instructions. We discussed slowly wearing an over the course of time. Patient come back in 2-4 weeks for further evaluation and treatment.

## 2014-04-09 NOTE — Patient Instructions (Signed)

## 2014-04-12 DIAGNOSIS — E785 Hyperlipidemia, unspecified: Secondary | ICD-10-CM | POA: Diagnosis not present

## 2014-04-12 DIAGNOSIS — I350 Nonrheumatic aortic (valve) stenosis: Secondary | ICD-10-CM | POA: Diagnosis not present

## 2014-04-12 DIAGNOSIS — I6522 Occlusion and stenosis of left carotid artery: Secondary | ICD-10-CM | POA: Diagnosis not present

## 2014-04-12 DIAGNOSIS — E662 Morbid (severe) obesity with alveolar hypoventilation: Secondary | ICD-10-CM | POA: Diagnosis not present

## 2014-05-20 ENCOUNTER — Ambulatory Visit (INDEPENDENT_AMBULATORY_CARE_PROVIDER_SITE_OTHER): Payer: Medicare Other

## 2014-05-20 DIAGNOSIS — J309 Allergic rhinitis, unspecified: Secondary | ICD-10-CM

## 2014-05-23 ENCOUNTER — Other Ambulatory Visit: Payer: Self-pay | Admitting: Family Medicine

## 2014-07-12 ENCOUNTER — Ambulatory Visit: Payer: Medicare Other | Admitting: Internal Medicine

## 2014-08-05 ENCOUNTER — Ambulatory Visit (INDEPENDENT_AMBULATORY_CARE_PROVIDER_SITE_OTHER): Payer: Medicare Other | Admitting: Family Medicine

## 2014-08-05 ENCOUNTER — Encounter: Payer: Self-pay | Admitting: Family Medicine

## 2014-08-05 VITALS — BP 110/70 | HR 63 | Temp 98.7°F | Ht 59.0 in | Wt 207.1 lb

## 2014-08-05 DIAGNOSIS — Z8639 Personal history of other endocrine, nutritional and metabolic disease: Secondary | ICD-10-CM

## 2014-08-05 DIAGNOSIS — E669 Obesity, unspecified: Secondary | ICD-10-CM

## 2014-08-05 DIAGNOSIS — E559 Vitamin D deficiency, unspecified: Secondary | ICD-10-CM | POA: Diagnosis not present

## 2014-08-05 DIAGNOSIS — M899 Disorder of bone, unspecified: Secondary | ICD-10-CM | POA: Diagnosis not present

## 2014-08-05 DIAGNOSIS — D509 Iron deficiency anemia, unspecified: Secondary | ICD-10-CM | POA: Diagnosis not present

## 2014-08-05 DIAGNOSIS — R109 Unspecified abdominal pain: Secondary | ICD-10-CM

## 2014-08-05 DIAGNOSIS — M949 Disorder of cartilage, unspecified: Secondary | ICD-10-CM

## 2014-08-05 DIAGNOSIS — Z0000 Encounter for general adult medical examination without abnormal findings: Secondary | ICD-10-CM | POA: Diagnosis not present

## 2014-08-05 DIAGNOSIS — M25561 Pain in right knee: Secondary | ICD-10-CM

## 2014-08-05 DIAGNOSIS — E1169 Type 2 diabetes mellitus with other specified complication: Secondary | ICD-10-CM

## 2014-08-05 DIAGNOSIS — K439 Ventral hernia without obstruction or gangrene: Secondary | ICD-10-CM

## 2014-08-05 DIAGNOSIS — I10 Essential (primary) hypertension: Secondary | ICD-10-CM

## 2014-08-05 DIAGNOSIS — E782 Mixed hyperlipidemia: Secondary | ICD-10-CM | POA: Diagnosis not present

## 2014-08-05 DIAGNOSIS — E119 Type 2 diabetes mellitus without complications: Secondary | ICD-10-CM

## 2014-08-05 DIAGNOSIS — M8580 Other specified disorders of bone density and structure, unspecified site: Secondary | ICD-10-CM

## 2014-08-05 DIAGNOSIS — E213 Hyperparathyroidism, unspecified: Secondary | ICD-10-CM

## 2014-08-05 DIAGNOSIS — K219 Gastro-esophageal reflux disease without esophagitis: Secondary | ICD-10-CM

## 2014-08-05 DIAGNOSIS — G4733 Obstructive sleep apnea (adult) (pediatric): Secondary | ICD-10-CM

## 2014-08-05 HISTORY — DX: Other specified disorders of bone density and structure, unspecified site: M85.80

## 2014-08-05 LAB — TSH: TSH: 1.84 u[IU]/mL (ref 0.35–4.50)

## 2014-08-05 LAB — COMPREHENSIVE METABOLIC PANEL
ALT: 14 U/L (ref 0–35)
AST: 18 U/L (ref 0–37)
Albumin: 4 g/dL (ref 3.5–5.2)
Alkaline Phosphatase: 103 U/L (ref 39–117)
BILIRUBIN TOTAL: 0.5 mg/dL (ref 0.2–1.2)
BUN: 15 mg/dL (ref 6–23)
CALCIUM: 9 mg/dL (ref 8.4–10.5)
CO2: 28 mEq/L (ref 19–32)
CREATININE: 0.7 mg/dL (ref 0.40–1.20)
Chloride: 106 mEq/L (ref 96–112)
GFR: 88.58 mL/min (ref >60.00)
GLUCOSE: 85 mg/dL (ref 70–99)
POTASSIUM: 3.8 meq/L (ref 3.5–5.1)
Sodium: 142 mEq/L (ref 135–145)
TOTAL PROTEIN: 6.7 g/dL (ref 6.0–8.3)

## 2014-08-05 LAB — LIPID PANEL
CHOL/HDL RATIO: 3
CHOLESTEROL: 121 mg/dL (ref 0–200)
HDL: 45.7 mg/dL (ref >39.00)
LDL CALC: 61 mg/dL (ref 0–99)
NonHDL: 75.3
TRIGLYCERIDES: 71 mg/dL (ref 0.0–149.0)
VLDL: 14.2 mg/dL (ref 0.0–40.0)

## 2014-08-05 LAB — FERRITIN: Ferritin: 15.5 ng/mL (ref 10.0–291.0)

## 2014-08-05 LAB — CBC
HEMATOCRIT: 37.5 % (ref 36.0–46.0)
Hemoglobin: 12.4 g/dL (ref 12.0–15.0)
MCHC: 33 g/dL (ref 30.0–36.0)
MCV: 91.9 fl (ref 78.0–100.0)
Platelets: 252 10*3/uL (ref 150.0–400.0)
RBC: 4.09 Mil/uL (ref 3.87–5.11)
RDW: 13.8 % (ref 11.5–15.5)
WBC: 7 10*3/uL (ref 4.0–10.5)

## 2014-08-05 LAB — VITAMIN D 25 HYDROXY (VIT D DEFICIENCY, FRACTURES): VITD: 9.76 ng/mL — AB (ref 30.00–100.00)

## 2014-08-05 LAB — HEMOGLOBIN A1C: Hgb A1c MFr Bld: 5.6 % (ref 4.6–6.5)

## 2014-08-05 NOTE — Patient Instructions (Signed)
Preventive Care for Adults A healthy lifestyle and preventive care can promote health and wellness. Preventive health guidelines for women include the following key practices.  A routine yearly physical is a good way to check with your health care provider about your health and preventive screening. It is a chance to share any concerns and updates on your health and to receive a thorough exam.  Visit your dentist for a routine exam and preventive care every 6 months. Brush your teeth twice a day and floss once a day. Good oral hygiene prevents tooth decay and gum disease.  The frequency of eye exams is based on your age, health, family medical history, use of contact lenses, and other factors. Follow your health care provider's recommendations for frequency of eye exams.  Eat a healthy diet. Foods like vegetables, fruits, whole grains, low-fat dairy products, and lean protein foods contain the nutrients you need without too many calories. Decrease your intake of foods high in solid fats, added sugars, and salt. Eat the right amount of calories for you.Get information about a proper diet from your health care provider, if necessary.  Regular physical exercise is one of the most important things you can do for your health. Most adults should get at least 150 minutes of moderate-intensity exercise (any activity that increases your heart rate and causes you to sweat) each week. In addition, most adults need muscle-strengthening exercises on 2 or more days a week.  Maintain a healthy weight. The body mass index (BMI) is a screening tool to identify possible weight problems. It provides an estimate of body fat based on height and weight. Your health care provider can find your BMI and can help you achieve or maintain a healthy weight.For adults 20 years and older:  A BMI below 18.5 is considered underweight.  A BMI of 18.5 to 24.9 is normal.  A BMI of 25 to 29.9 is considered overweight.  A BMI of  30 and above is considered obese.  Maintain normal blood lipids and cholesterol levels by exercising and minimizing your intake of saturated fat. Eat a balanced diet with plenty of fruit and vegetables. Blood tests for lipids and cholesterol should begin at age 76 and be repeated every 5 years. If your lipid or cholesterol levels are high, you are over 50, or you are at high risk for heart disease, you may need your cholesterol levels checked more frequently.Ongoing high lipid and cholesterol levels should be treated with medicines if diet and exercise are not working.  If you smoke, find out from your health care provider how to quit. If you do not use tobacco, do not start.  Lung cancer screening is recommended for adults aged 22-80 years who are at high risk for developing lung cancer because of a history of smoking. A yearly low-dose CT scan of the lungs is recommended for people who have at least a 30-pack-year history of smoking and are a current smoker or have quit within the past 15 years. A pack year of smoking is smoking an average of 1 pack of cigarettes a day for 1 year (for example: 1 pack a day for 30 years or 2 packs a day for 15 years). Yearly screening should continue until the smoker has stopped smoking for at least 15 years. Yearly screening should be stopped for people who develop a health problem that would prevent them from having lung cancer treatment.  If you are pregnant, do not drink alcohol. If you are breastfeeding,  be very cautious about drinking alcohol. If you are not pregnant and choose to drink alcohol, do not have more than 1 drink per day. One drink is considered to be 12 ounces (355 mL) of beer, 5 ounces (148 mL) of wine, or 1.5 ounces (44 mL) of liquor.  Avoid use of street drugs. Do not share needles with anyone. Ask for help if you need support or instructions about stopping the use of drugs.  High blood pressure causes heart disease and increases the risk of  stroke. Your blood pressure should be checked at least every 1 to 2 years. Ongoing high blood pressure should be treated with medicines if weight loss and exercise do not work.  If you are 75-52 years old, ask your health care provider if you should take aspirin to prevent strokes.  Diabetes screening involves taking a blood sample to check your fasting blood sugar level. This should be done once every 3 years, after age 15, if you are within normal weight and without risk factors for diabetes. Testing should be considered at a younger age or be carried out more frequently if you are overweight and have at least 1 risk factor for diabetes.  Breast cancer screening is essential preventive care for women. You should practice "breast self-awareness." This means understanding the normal appearance and feel of your breasts and may include breast self-examination. Any changes detected, no matter how small, should be reported to a health care provider. Women in their 58s and 30s should have a clinical breast exam (CBE) by a health care provider as part of a regular health exam every 1 to 3 years. After age 16, women should have a CBE every year. Starting at age 53, women should consider having a mammogram (breast X-ray test) every year. Women who have a family history of breast cancer should talk to their health care provider about genetic screening. Women at a high risk of breast cancer should talk to their health care providers about having an MRI and a mammogram every year.  Breast cancer gene (BRCA)-related cancer risk assessment is recommended for women who have family members with BRCA-related cancers. BRCA-related cancers include breast, ovarian, tubal, and peritoneal cancers. Having family members with these cancers may be associated with an increased risk for harmful changes (mutations) in the breast cancer genes BRCA1 and BRCA2. Results of the assessment will determine the need for genetic counseling and  BRCA1 and BRCA2 testing.  Routine pelvic exams to screen for cancer are no longer recommended for nonpregnant women who are considered low risk for cancer of the pelvic organs (ovaries, uterus, and vagina) and who do not have symptoms. Ask your health care provider if a screening pelvic exam is right for you.  If you have had past treatment for cervical cancer or a condition that could lead to cancer, you need Pap tests and screening for cancer for at least 20 years after your treatment. If Pap tests have been discontinued, your risk factors (such as having a new sexual partner) need to be reassessed to determine if screening should be resumed. Some women have medical problems that increase the chance of getting cervical cancer. In these cases, your health care provider may recommend more frequent screening and Pap tests.  The HPV test is an additional test that may be used for cervical cancer screening. The HPV test looks for the virus that can cause the cell changes on the cervix. The cells collected during the Pap test can be  tested for HPV. The HPV test could be used to screen women aged 30 years and older, and should be used in women of any age who have unclear Pap test results. After the age of 30, women should have HPV testing at the same frequency as a Pap test.  Colorectal cancer can be detected and often prevented. Most routine colorectal cancer screening begins at the age of 50 years and continues through age 75 years. However, your health care provider may recommend screening at an earlier age if you have risk factors for colon cancer. On a yearly basis, your health care provider may provide home test kits to check for hidden blood in the stool. Use of a small camera at the end of a tube, to directly examine the colon (sigmoidoscopy or colonoscopy), can detect the earliest forms of colorectal cancer. Talk to your health care provider about this at age 50, when routine screening begins. Direct  exam of the colon should be repeated every 5-10 years through age 75 years, unless early forms of pre-cancerous polyps or small growths are found.  People who are at an increased risk for hepatitis B should be screened for this virus. You are considered at high risk for hepatitis B if:  You were born in a country where hepatitis B occurs often. Talk with your health care provider about which countries are considered high risk.  Your parents were born in a high-risk country and you have not received a shot to protect against hepatitis B (hepatitis B vaccine).  You have HIV or AIDS.  You use needles to inject street drugs.  You live with, or have sex with, someone who has hepatitis B.  You get hemodialysis treatment.  You take certain medicines for conditions like cancer, organ transplantation, and autoimmune conditions.  Hepatitis C blood testing is recommended for all people born from 1945 through 1965 and any individual with known risks for hepatitis C.  Practice safe sex. Use condoms and avoid high-risk sexual practices to reduce the spread of sexually transmitted infections (STIs). STIs include gonorrhea, chlamydia, syphilis, trichomonas, herpes, HPV, and human immunodeficiency virus (HIV). Herpes, HIV, and HPV are viral illnesses that have no cure. They can result in disability, cancer, and death.  You should be screened for sexually transmitted illnesses (STIs) including gonorrhea and chlamydia if:  You are sexually active and are younger than 24 years.  You are older than 24 years and your health care provider tells you that you are at risk for this type of infection.  Your sexual activity has changed since you were last screened and you are at an increased risk for chlamydia or gonorrhea. Ask your health care provider if you are at risk.  If you are at risk of being infected with HIV, it is recommended that you take a prescription medicine daily to prevent HIV infection. This is  called preexposure prophylaxis (PrEP). You are considered at risk if:  You are a heterosexual woman, are sexually active, and are at increased risk for HIV infection.  You take drugs by injection.  You are sexually active with a partner who has HIV.  Talk with your health care provider about whether you are at high risk of being infected with HIV. If you choose to begin PrEP, you should first be tested for HIV. You should then be tested every 3 months for as long as you are taking PrEP.  Osteoporosis is a disease in which the bones lose minerals and strength   with aging. This can result in serious bone fractures or breaks. The risk of osteoporosis can be identified using a bone density scan. Women ages 65 years and over and women at risk for fractures or osteoporosis should discuss screening with their health care providers. Ask your health care provider whether you should take a calcium supplement or vitamin D to reduce the rate of osteoporosis.  Menopause can be associated with physical symptoms and risks. Hormone replacement therapy is available to decrease symptoms and risks. You should talk to your health care provider about whether hormone replacement therapy is right for you.  Use sunscreen. Apply sunscreen liberally and repeatedly throughout the day. You should seek shade when your shadow is shorter than you. Protect yourself by wearing long sleeves, pants, a wide-brimmed hat, and sunglasses year round, whenever you are outdoors.  Once a month, do a whole body skin exam, using a mirror to look at the skin on your back. Tell your health care provider of new moles, moles that have irregular borders, moles that are larger than a pencil eraser, or moles that have changed in shape or color.  Stay current with required vaccines (immunizations).  Influenza vaccine. All adults should be immunized every year.  Tetanus, diphtheria, and acellular pertussis (Td, Tdap) vaccine. Pregnant women should  receive 1 dose of Tdap vaccine during each pregnancy. The dose should be obtained regardless of the length of time since the last dose. Immunization is preferred during the 27th-36th week of gestation. An adult who has not previously received Tdap or who does not know her vaccine status should receive 1 dose of Tdap. This initial dose should be followed by tetanus and diphtheria toxoids (Td) booster doses every 10 years. Adults with an unknown or incomplete history of completing a 3-dose immunization series with Td-containing vaccines should begin or complete a primary immunization series including a Tdap dose. Adults should receive a Td booster every 10 years.  Varicella vaccine. An adult without evidence of immunity to varicella should receive 2 doses or a second dose if she has previously received 1 dose. Pregnant females who do not have evidence of immunity should receive the first dose after pregnancy. This first dose should be obtained before leaving the health care facility. The second dose should be obtained 4-8 weeks after the first dose.  Human papillomavirus (HPV) vaccine. Females aged 13-26 years who have not received the vaccine previously should obtain the 3-dose series. The vaccine is not recommended for use in pregnant females. However, pregnancy testing is not needed before receiving a dose. If a female is found to be pregnant after receiving a dose, no treatment is needed. In that case, the remaining doses should be delayed until after the pregnancy. Immunization is recommended for any person with an immunocompromised condition through the age of 26 years if she did not get any or all doses earlier. During the 3-dose series, the second dose should be obtained 4-8 weeks after the first dose. The third dose should be obtained 24 weeks after the first dose and 16 weeks after the second dose.  Zoster vaccine. One dose is recommended for adults aged 60 years or older unless certain conditions are  present.  Measles, mumps, and rubella (MMR) vaccine. Adults born before 1957 generally are considered immune to measles and mumps. Adults born in 1957 or later should have 1 or more doses of MMR vaccine unless there is a contraindication to the vaccine or there is laboratory evidence of immunity to   each of the three diseases. A routine second dose of MMR vaccine should be obtained at least 28 days after the first dose for students attending postsecondary schools, health care workers, or international travelers. People who received inactivated measles vaccine or an unknown type of measles vaccine during 1963-1967 should receive 2 doses of MMR vaccine. People who received inactivated mumps vaccine or an unknown type of mumps vaccine before 1979 and are at high risk for mumps infection should consider immunization with 2 doses of MMR vaccine. For females of childbearing age, rubella immunity should be determined. If there is no evidence of immunity, females who are not pregnant should be vaccinated. If there is no evidence of immunity, females who are pregnant should delay immunization until after pregnancy. Unvaccinated health care workers born before 1957 who lack laboratory evidence of measles, mumps, or rubella immunity or laboratory confirmation of disease should consider measles and mumps immunization with 2 doses of MMR vaccine or rubella immunization with 1 dose of MMR vaccine.  Pneumococcal 13-valent conjugate (PCV13) vaccine. When indicated, a person who is uncertain of her immunization history and has no record of immunization should receive the PCV13 vaccine. An adult aged 19 years or older who has certain medical conditions and has not been previously immunized should receive 1 dose of PCV13 vaccine. This PCV13 should be followed with a dose of pneumococcal polysaccharide (PPSV23) vaccine. The PPSV23 vaccine dose should be obtained at least 8 weeks after the dose of PCV13 vaccine. An adult aged 19  years or older who has certain medical conditions and previously received 1 or more doses of PPSV23 vaccine should receive 1 dose of PCV13. The PCV13 vaccine dose should be obtained 1 or more years after the last PPSV23 vaccine dose.  Pneumococcal polysaccharide (PPSV23) vaccine. When PCV13 is also indicated, PCV13 should be obtained first. All adults aged 65 years and older should be immunized. An adult younger than age 65 years who has certain medical conditions should be immunized. Any person who resides in a nursing home or long-term care facility should be immunized. An adult smoker should be immunized. People with an immunocompromised condition and certain other conditions should receive both PCV13 and PPSV23 vaccines. People with human immunodeficiency virus (HIV) infection should be immunized as soon as possible after diagnosis. Immunization during chemotherapy or radiation therapy should be avoided. Routine use of PPSV23 vaccine is not recommended for American Indians, [place] Natives, or people younger than 65 years unless there are medical conditions that require PPSV23 vaccine. When indicated, people who have unknown immunization and have no record of immunization should receive PPSV23 vaccine. One-time revaccination 5 years after the first dose of PPSV23 is recommended for people aged 19-64 years who have chronic kidney failure, nephrotic syndrome, asplenia, or immunocompromised conditions. People who received 1-2 doses of PPSV23 before age 65 years should receive another dose of PPSV23 vaccine at age 65 years or later if at least 5 years have passed since the previous dose. Doses of PPSV23 are not needed for people immunized with PPSV23 at or after age 65 years.  Meningococcal vaccine. Adults with asplenia or persistent complement component deficiencies should receive 2 doses of quadrivalent meningococcal conjugate (MenACWY-D) vaccine. The doses should be obtained at least 2 months apart.  Microbiologists working with certain meningococcal bacteria, military recruits, people at risk during an outbreak, and people who travel to or live in countries with a high rate of meningitis should be immunized. A first-year college student up through age   21 years who is living in a residence hall should receive a dose if she did not receive a dose on or after her 16th birthday. Adults who have certain high-risk conditions should receive one or more doses of vaccine.  Hepatitis A vaccine. Adults who wish to be protected from this disease, have certain high-risk conditions, work with hepatitis A-infected animals, work in hepatitis A research labs, or travel to or work in countries with a high rate of hepatitis A should be immunized. Adults who were previously unvaccinated and who anticipate close contact with an international adoptee during the first 60 days after arrival in the [place] from a country with a high rate of hepatitis A should be immunized.  Hepatitis B vaccine. Adults who wish to be protected from this disease, have certain high-risk conditions, may be exposed to blood or other infectious body fluids, are household contacts or sex partners of hepatitis B positive people, are clients or workers in certain care facilities, or travel to or work in countries with a high rate of hepatitis B should be immunized.  Haemophilus influenzae type b (Hib) vaccine. A previously unvaccinated person with asplenia or sickle cell disease or having a scheduled splenectomy should receive 1 dose of Hib vaccine. Regardless of previous immunization, a recipient of a hematopoietic stem cell transplant should receive a 3-dose series 6-12 months after her successful transplant. Hib vaccine is not recommended for adults with HIV infection. Preventive Services / Frequency Ages 64 to 68 years  Blood pressure check.** / Every 1 to 2 years.  Lipid and cholesterol check.** / Every 5 years beginning at age  22.  Clinical breast exam.** / Every 3 years for women in their 88s and 53s.  BRCA-related cancer risk assessment.** / For women who have family members with a BRCA-related cancer (breast, ovarian, tubal, or peritoneal cancers).  Pap test.** / Every 2 years from ages 90 through 51. Every 3 years starting at age 21 through age 56 or 3 with a history of 3 consecutive normal Pap tests.  HPV screening.** / Every 3 years from ages 24 through ages 1 to 46 with a history of 3 consecutive normal Pap tests.  Hepatitis C blood test.** / For any individual with known risks for hepatitis C.  Skin self-exam. / Monthly.  Influenza vaccine. / Every year.  Tetanus, diphtheria, and acellular pertussis (Tdap, Td) vaccine.** / Consult your health care provider. Pregnant women should receive 1 dose of Tdap vaccine during each pregnancy. 1 dose of Td every 10 years.  Varicella vaccine.** / Consult your health care provider. Pregnant females who do not have evidence of immunity should receive the first dose after pregnancy.  HPV vaccine. / 3 doses over 6 months, if 72 and younger. The vaccine is not recommended for use in pregnant females. However, pregnancy testing is not needed before receiving a dose.  Measles, mumps, rubella (MMR) vaccine.** / You need at least 1 dose of MMR if you were born in 1957 or later. You may also need a 2nd dose. For females of childbearing age, rubella immunity should be determined. If there is no evidence of immunity, females who are not pregnant should be vaccinated. If there is no evidence of immunity, females who are pregnant should delay immunization until after pregnancy.  Pneumococcal 13-valent conjugate (PCV13) vaccine.** / Consult your health care provider.  Pneumococcal polysaccharide (PPSV23) vaccine.** / 1 to 2 doses if you smoke cigarettes or if you have certain conditions.  Meningococcal vaccine.** /  1 dose if you are age 19 to 21 years and a first-year college  student living in a residence hall, or have one of several medical conditions, you need to get vaccinated against meningococcal disease. You may also need additional booster doses.  Hepatitis A vaccine.** / Consult your health care provider.  Hepatitis B vaccine.** / Consult your health care provider.  Haemophilus influenzae type b (Hib) vaccine.** / Consult your health care provider. Ages 40 to 64 years  Blood pressure check.** / Every 1 to 2 years.  Lipid and cholesterol check.** / Every 5 years beginning at age 20 years.  Lung cancer screening. / Every year if you are aged 55-80 years and have a 30-pack-year history of smoking and currently smoke or have quit within the past 15 years. Yearly screening is stopped once you have quit smoking for at least 15 years or develop a health problem that would prevent you from having lung cancer treatment.  Clinical breast exam.** / Every year after age 40 years.  BRCA-related cancer risk assessment.** / For women who have family members with a BRCA-related cancer (breast, ovarian, tubal, or peritoneal cancers).  Mammogram.** / Every year beginning at age 40 years and continuing for as long as you are in good health. Consult with your health care provider.  Pap test.** / Every 3 years starting at age 30 years through age 65 or 70 years with a history of 3 consecutive normal Pap tests.  HPV screening.** / Every 3 years from ages 30 years through ages 65 to 70 years with a history of 3 consecutive normal Pap tests.  Fecal occult blood test (FOBT) of stool. / Every year beginning at age 50 years and continuing until age 75 years. You may not need to do this test if you get a colonoscopy every 10 years.  Flexible sigmoidoscopy or colonoscopy.** / Every 5 years for a flexible sigmoidoscopy or every 10 years for a colonoscopy beginning at age 50 years and continuing until age 75 years.  Hepatitis C blood test.** / For all people born from 1945 through  1965 and any individual with known risks for hepatitis C.  Skin self-exam. / Monthly.  Influenza vaccine. / Every year.  Tetanus, diphtheria, and acellular pertussis (Tdap/Td) vaccine.** / Consult your health care provider. Pregnant women should receive 1 dose of Tdap vaccine during each pregnancy. 1 dose of Td every 10 years.  Varicella vaccine.** / Consult your health care provider. Pregnant females who do not have evidence of immunity should receive the first dose after pregnancy.  Zoster vaccine.** / 1 dose for adults aged 60 years or older.  Measles, mumps, rubella (MMR) vaccine.** / You need at least 1 dose of MMR if you were born in 1957 or later. You may also need a 2nd dose. For females of childbearing age, rubella immunity should be determined. If there is no evidence of immunity, females who are not pregnant should be vaccinated. If there is no evidence of immunity, females who are pregnant should delay immunization until after pregnancy.  Pneumococcal 13-valent conjugate (PCV13) vaccine.** / Consult your health care provider.  Pneumococcal polysaccharide (PPSV23) vaccine.** / 1 to 2 doses if you smoke cigarettes or if you have certain conditions.  Meningococcal vaccine.** / Consult your health care provider.  Hepatitis A vaccine.** / Consult your health care provider.  Hepatitis B vaccine.** / Consult your health care provider.  Haemophilus influenzae type b (Hib) vaccine.** / Consult your health care provider. Ages 65   years and over  Blood pressure check.** / Every 1 to 2 years.  Lipid and cholesterol check.** / Every 5 years beginning at age 22 years.  Lung cancer screening. / Every year if you are aged 73-80 years and have a 30-pack-year history of smoking and currently smoke or have quit within the past 15 years. Yearly screening is stopped once you have quit smoking for at least 15 years or develop a health problem that would prevent you from having lung cancer  treatment.  Clinical breast exam.** / Every year after age 4 years.  BRCA-related cancer risk assessment.** / For women who have family members with a BRCA-related cancer (breast, ovarian, tubal, or peritoneal cancers).  Mammogram.** / Every year beginning at age 40 years and continuing for as long as you are in good health. Consult with your health care provider.  Pap test.** / Every 3 years starting at age 9 years through age 34 or 91 years with 3 consecutive normal Pap tests. Testing can be stopped between 65 and 70 years with 3 consecutive normal Pap tests and no abnormal Pap or HPV tests in the past 10 years.  HPV screening.** / Every 3 years from ages 57 years through ages 64 or 45 years with a history of 3 consecutive normal Pap tests. Testing can be stopped between 65 and 70 years with 3 consecutive normal Pap tests and no abnormal Pap or HPV tests in the past 10 years.  Fecal occult blood test (FOBT) of stool. / Every year beginning at age 15 years and continuing until age 17 years. You may not need to do this test if you get a colonoscopy every 10 years.  Flexible sigmoidoscopy or colonoscopy.** / Every 5 years for a flexible sigmoidoscopy or every 10 years for a colonoscopy beginning at age 86 years and continuing until age 71 years.  Hepatitis C blood test.** / For all people born from [birth year] through 1963 and any individual with known risks for hepatitis C.  Osteoporosis screening.** / A one-time screening for women ages 83 years and over and women at risk for fractures or osteoporosis.  Skin self-exam. / Monthly.  Influenza vaccine. / Every year.  Tetanus, diphtheria, and acellular pertussis (Tdap/Td) vaccine.** / 1 dose of Td every 10 years.  Varicella vaccine.** / Consult your health care provider.  Zoster vaccine.** / 1 dose for adults aged 61 years or older.  Pneumococcal 13-valent conjugate (PCV13) vaccine.** / Consult your health care provider.  Pneumococcal  polysaccharide (PPSV23) vaccine.** / 1 dose for all adults aged 28 years and older.  Meningococcal vaccine.** / Consult your health care provider.  Hepatitis A vaccine.** / Consult your health care provider.  Hepatitis B vaccine.** / Consult your health care provider.  Haemophilus influenzae type b (Hib) vaccine.** / Consult your health care provider. ** Family history and personal history of risk and conditions may change your health care provider's recommendations. Document Released: 03/06/2001 Document Revised: 05/25/2013 Document Reviewed: 06/05/2010 [facility] Patient Information 2015 [place], [place]. This information is not intended to replace advice given to you by your health care provider. Make sure you discuss any questions you have with your health care provider.

## 2014-08-05 NOTE — Progress Notes (Signed)
BOBETTE LEYH  [id number] 11-28-1945 08/05/2014      Progress Note-Follow Up  Subjective  Chief Complaint  Chief Complaint  Patient presents with  . Medicare Wellness    HPI  Patient is a 69 y.o. female in today for routine medical care. atient is in today for annual exam. She the swollen cause pain. She also  She is having abdominal pa Does feel her ventral No bloody or tarry stool No recent illness or emergency room visits. Continues to work full time. Follows with Dr Leo Grosser with GYN and Dr Renda Rolls of dermatology. Denies CP/palp/SOB/HA/congestion/fevers/GI or GU c/o. Taking meds as prescribed  Past Medical History  Diagnosis Date  . Obesity   . Esophageal reflux   . Fibromyalgia   . Asthma   . Allergy     rhinitis  . Sleep apnea, obstructive   . Bronchitis, acute 10/31/2010  . Otitis media of both ears 11/16/2010  . Depression with anxiety 01/08/2011  . Back pain 01/08/2011  . Sinusitis 06/11/2011  . Fatigue 06/11/2011  . Knee pain, right 09/20/2011  . Otitis media 09/20/2011  . Varicose veins of lower limb 10/02/2011  . Otitis media of left ear 09/19/2010  . Motion sickness 12/24/2011  . Sinusitis 12/24/2011  . Nausea & vomiting 12/24/2011  . Heart murmur   . Panic attacks   . Arthritis 02-01-12    osteoarthritis, Back pain, spine surgeries ? retain hardware cervial and  lumbar.  . Anemia 02-01-12    iron absorption problem  . Cancer 02-01-12    squamous cell -face  . Allergic state 09/21/2010    Past Surgical History  Procedure Laterality Date  . Bariatric surgery    . Lumbar disc surgery    . Bladder suspension      complicated by bowel nick/ clolostomy  . Gallbladder surgery  1978  . Appendectomy  1962  . Gastric bypass  2003    Patient also noted "gastric bypass resection - 2004"  . Cervical disc surgery      C-7 in 2004, C-4 and C-5 in 2010  . Colostomy bag      Confirm with patient. Listed under medical conditions on form dated 07/18/09.  [name]      Per medical history form dated 07/18/09.  [name] Hernia repair  2009    Two hernias and abdominal reconstruction  . Ivc filter      prophyllactically- no hx DVT  . Carpal tunnel release  02-01-12    right  . Colostomy reversal  02-01-12  . Abdominal surgery  02-01-12    ABDOMINAL SURGERY  . Vulva surgery  02-01-12    cyst removal-pt 8 months pregnant  . Total knee arthroplasty  02/11/2012    Procedure: TOTAL KNEE ARTHROPLASTY;  Surgeon: Mauri Pole, MD;  Location: [facility];  Service: Orthopedics;  Laterality: Right;    Family History  Problem Relation Age of Onset  . Alcohol abuse Mother   . Other Mother     accidental med overdose  . Emphysema Mother     smoked  . Bipolar disorder Mother   . Other Father     CHF  . Diabetes Father   . Cancer Father     throat ca/ smoked  . Alcohol abuse Father   . Stroke Father   . Hypertension Brother   . Hyperlipidemia Brother   . Obesity Brother   . Heart attack Maternal Grandfather   . Heart attack Paternal Grandmother   .  Heart attack Paternal Grandfather   . Arthritis Son     psoriatic  . Arthritis Son     psoriatic  . Obesity Son     History   Social History  . Marital Status: Married    Spouse Name: N/A  . Number of Children: N/A  . Years of Education: N/A   Occupational History  . Not on file.   Social History Main Topics  . Smoking status: Never Smoker   . Smokeless tobacco: Never Used  . Alcohol Use: No     Comment: special occasions  . Drug Use: No  . Sexual Activity: Yes   Other Topics Concern  . Not on file   Social History Narrative    Current Outpatient Prescriptions on File Prior to Visit  Medication Sig Dispense Refill  . ALPRAZolam (XANAX) 0.25 MG tablet Take 0.25 mg by mouth daily as needed. For anxiety    . aspirin 81 MG tablet Take 81 mg by mouth daily.    [name] atorvastatin (LIPITOR) 10 MG tablet Take 10 mg by mouth daily.    . bifidobacterium infantis (ALIGN) capsule Take 1  capsule by mouth daily.     . DULoxetine (CYMBALTA) 60 MG capsule Take 60 mg by mouth every morning.     . methylphenidate (RITALIN) 10 MG tablet Take 10 mg by mouth 2 (two) times daily.    . NON FORMULARY once a week.     . NON FORMULARY CPAP- set on [employer].    [name] omeprazole (PRILOSEC OTC) 20 MG tablet Take 20 mg by mouth daily.    . ranitidine (ZANTAC) 150 MG tablet 1 tablet qam    . traZODone (DESYREL) 100 MG tablet 1/2 - 1 tab for sleep as directed 90 tablet 1  . VESICARE 10 MG tablet TAKE ONE TABLET BY MOUTH ONCE DAILY 30 tablet 4  . traMADol (ULTRAM) 50 MG tablet Take 1 tablet (50 mg total) by mouth at bedtime as needed. (Patient not taking: Reported on 08/05/2014) 30 tablet 0  . Vitamin D, Ergocalciferol, (DRISDOL) 50000 UNITS CAPS capsule Take 1 capsule (50,000 Units total) by mouth every 7 (seven) days. (Patient not taking: Reported on 08/05/2014) 8 capsule 0   No current facility-administered medications on file prior to visit.    Allergies  Allergen Reactions  . Hydrocodone      nausea and vomiting and headaches.  . Advil [Ibuprofen]     itching  . Cephalexin      Neuropathy  . Codeine     Crazy in the head, bp drops  . Levofloxacin Itching  . Meperidine Hcl     B/P drops  . Morphine And Related      States" extreme migraines"  . Oxycodone-Acetaminophen Other (See Comments)    Crazy in head, drop in bp  . Sulfonamide Derivatives Nausea And Vomiting    Stomach upset  . Adhesive [Tape] Rash    Latex in adhesive tape. Please use paper tape  . Penicillins Nausea And Vomiting and Rash    Review of Systems  Review of Systems  Constitutional: Positive for malaise/fatigue. Negative for fever and chills.  HENT: Negative for congestion, hearing loss and nosebleeds.   Eyes: Negative for discharge.  Respiratory: Negative for cough, sputum production, shortness of breath and wheezing.   Cardiovascular: Negative for chest pain, palpitations and leg swelling.    Gastrointestinal: Positive for nausea, vomiting, abdominal pain and diarrhea. Negative for heartburn, constipation, blood in stool and melena.  Genitourinary: Negative for dysuria, urgency, frequency and hematuria.  Musculoskeletal: Positive for myalgias, back pain and joint pain. Negative for falls.  Skin: Negative for rash.  Neurological: Negative for dizziness, tremors, sensory change, focal weakness, loss of consciousness, weakness and headaches.  Endo/Heme/Allergies: Negative for polydipsia. Does not bruise/bleed easily.  Psychiatric/Behavioral: Positive for depression. Negative for suicidal ideas. The patient is not nervous/anxious and does not have insomnia.     Objective  BP 110/70 mmHg  Pulse 63  Temp(Src) 98.7 F (37.1 C) (Oral)  Ht 4\' 11"  (1.499 m)  Wt 207 lb 2 oz (93.951 kg)  BMI 41.81 kg/m2  SpO2 95%  Physical Exam  Physical Exam  Constitutional: She is oriented to person, place, and time and well-developed, well-nourished, and in no distress. No distress.  HENT:  Head: Normocephalic and atraumatic.  Right Ear: External ear normal.  Left Ear: External ear normal.  Nose: Nose normal.  Mouth/Throat: Oropharynx is clear and moist. No oropharyngeal exudate.  Eyes: Conjunctivae are normal. Pupils are equal, round, and reactive to light. Right eye exhibits no discharge. Left eye exhibits no discharge. No scleral icterus.  Neck: Normal range of motion. Neck supple. No thyromegaly present.  Cardiovascular: Normal rate, regular rhythm, normal heart sounds and intact distal pulses.   No murmur heard. Pulmonary/Chest: Effort normal and breath sounds normal. No respiratory distress. She has no wheezes. She has no rales.  Abdominal: Soft. Bowel sounds are normal. She exhibits no distension and no mass. There is tenderness. There is no rebound and no guarding.  Scar tissue along midline scar, tender with palp. No redness or warmth.  Musculoskeletal: Normal range of motion. She  exhibits no edema or tenderness.  Lymphadenopathy:    She has no cervical adenopathy.  Neurological: She is alert and oriented to person, place, and time. She has normal reflexes. No cranial nerve deficit. Coordination normal.  Skin: Skin is warm and dry. No rash noted. She is not diaphoretic.  Psychiatric: Mood, memory and affect normal.    Lab Results  Component Value Date   TSH 2.76 02/16/2014   Lab Results  Component Value Date   WBC 5.9 02/16/2014   HGB 12.3 02/16/2014   HCT 36.9 02/16/2014   MCV 90.1 02/16/2014   PLT 220.0 02/16/2014   Lab Results  Component Value Date   CREATININE 0.79 02/16/2014   BUN 15 02/16/2014   NA 141 02/16/2014   K 3.8 02/16/2014   CL 107 02/16/2014   CO2 27 02/16/2014   Lab Results  Component Value Date   ALT 13 02/16/2014   AST 15 02/16/2014   ALKPHOS 105 02/16/2014   BILITOT 0.6 02/16/2014   Lab Results  Component Value Date   CHOL 108 02/16/2014   Lab Results  Component Value Date   HDL 46.30 02/16/2014   Lab Results  Component Value Date   LDLCALC 49 02/16/2014   Lab Results  Component Value Date   TRIG 65.0 02/16/2014   Lab Results  Component Value Date   CHOLHDL 2 02/16/2014     Assessment & Plan  ESSENTIAL HYPERTENSION, BENIGN Well controlled, no changes to meds. Encouraged heart healthy diet such as the DASH diet and exercise as tolerated.   Hyperparathyroidism Referred to endocrinology for further consideration  Obesity Encouraged DASH diet, decrease po intake and increase exercise as tolerated. Needs 7-8 hours of sleep nightly. Avoid trans fats, eat small, frequent meals every 4-5 hours with lean proteins, complex carbs and healthy fats. Minimize simple  carbs, GMO foods.  Vitamin D deficiency Very low, is started on Vitamin D 50000 IU q week and a daily OTC tab, recheck in 3 months  Hyperlipidemia, mixed Encouraged heart healthy diet, increase exercise, avoid trans fats, consider a krill oil cap  daily  Diabetes mellitus type 2 in obese hgba1c acceptable, minimize simple carbs. Increase exercise as tolerated. Continue current meds  Hypocalcemia Normal today  Abdominal pain Persistent pain, intermittently intolerable at 9 of 10 pain level, also notes nausea, vomiting and diarrhea at times. Will proceed CT scan of abdomen due to persistence and severity of symptoms  ESOPHAGEAL REFLUX Avoid offending foods, start probiotics. Do not eat large meals in late evening and consider raising head of bed.   Knee pain, right Continues to struggle with daily pain, follows with Dr Alvan Dame, ortho  Medicare annual wellness visit, subsequent Dr Annamaria Boots of pulmonology Dr [place] Calix Dr [facility] derm Dr Tamala Julian sports med Pap 2012, declines MGM may 2015, repeat q 2years Colonoscopy 2011, repeat in 10 years  Patient denies any difficulties at home. No trouble with ADLs, depression or falls. No recent changes to vision or hearing. Is UTD with immunizations. Is UTD with screening. Discussed Advanced Directives, patient agrees to bring [place] copies of documents if can. Encouraged heart healthy diet, exercise as tolerated and adequate sleep See problem list for risk factors See AVS for preventative health recommendations  Obstructive sleep apnea Using CPAP  Ventral hernia With worsening abdominal pain, some nausea, vomiting and diarrhea. Will proceed with CT scan to further investigate

## 2014-08-05 NOTE — Progress Notes (Signed)
Pre visit review using our clinic review tool, if applicable. No additional management support is needed unless otherwise documented below in the visit note. 

## 2014-08-06 ENCOUNTER — Other Ambulatory Visit: Payer: Self-pay | Admitting: Family Medicine

## 2014-08-06 DIAGNOSIS — E559 Vitamin D deficiency, unspecified: Secondary | ICD-10-CM

## 2014-08-06 LAB — PTH, INTACT AND CALCIUM
Calcium: 8.7 mg/dL (ref 8.4–10.5)
PTH: 108 pg/mL — ABNORMAL HIGH (ref 14–64)

## 2014-08-06 MED ORDER — VITAMIN D (ERGOCALCIFEROL) 1.25 MG (50000 UNIT) PO CAPS: 50000.0000 [IU] | ORAL_CAPSULE | ORAL | Status: DC

## 2014-08-06 NOTE — Telephone Encounter (Signed)
Lab entered at [facility] in 3 months to check Vitamin D. Also sent in Vitamin D 50,000 to pharmacy.

## 2014-08-09 ENCOUNTER — Ambulatory Visit (INDEPENDENT_AMBULATORY_CARE_PROVIDER_SITE_OTHER)
Admission: RE | Admit: 2014-08-09 | Discharge: 2014-08-09 | Disposition: A | Discharge status: Home / Self Care | Payer: Medicare Other | Source: Ambulatory Visit | Attending: Family Medicine | Admitting: Family Medicine

## 2014-08-09 ENCOUNTER — Other Ambulatory Visit: Payer: Medicare Other

## 2014-08-09 ENCOUNTER — Other Ambulatory Visit: Payer: Self-pay | Admitting: Family Medicine

## 2014-08-09 ENCOUNTER — Ambulatory Visit (INDEPENDENT_AMBULATORY_CARE_PROVIDER_SITE_OTHER): Payer: Medicare Other | Admitting: Family Medicine

## 2014-08-09 ENCOUNTER — Encounter: Payer: Self-pay | Admitting: Family Medicine

## 2014-08-09 VITALS — BP 116/76 | HR 62 | Wt 208.0 lb

## 2014-08-09 DIAGNOSIS — M25561 Pain in right knee: Secondary | ICD-10-CM

## 2014-08-09 DIAGNOSIS — E213 Hyperparathyroidism, unspecified: Secondary | ICD-10-CM

## 2014-08-09 DIAGNOSIS — M7989 Other specified soft tissue disorders: Secondary | ICD-10-CM | POA: Diagnosis not present

## 2014-08-09 MED ORDER — MONTELUKAST SODIUM 10 MG PO TABS: 10.0000 mg | ORAL_TABLET | Freq: Every day | ORAL | Status: DC

## 2014-08-09 NOTE — Patient Instructions (Signed)
[name] Ice can help pennsaid pinkie amount topically 2 times daily as needed.  Singulair 10 mg daily Conitnue the zyrtec Xrays and blood draw today I will call you if anything needs to change See me again in 3-4 weeks.

## 2014-08-09 NOTE — Progress Notes (Signed)
  [name] [address] [place], [place] [address] Phone: [phone] Subjective:     CC: Right knee pain  BWI:[id number] KIYANNA Wood is a 69 y.o. female coming in with complaint of right knee pain. Patient has had a past medical history significant for moderate arthritis of this right knee. Patient's x-rays in 2013 were reviewed by me and does justify this diagnosis. Patient states soon after 2013 patient did have a knee replacement. Patient had this 2-1/2 years ago. Patient states she continues to have pain. Has not been back to follow-up with her orthopedic surgeon. Patient states that the full of dense was significantly painful and was not a good experience. Patient still has pain mostly on the anterior and medial aspects of the knee. Sometimes does have clicking that is very painful. Denies any instability.     Past medical history, social, surgical and family history all reviewed in electronic medical record.   Review of Systems: No headache, visual changes, nausea, vomiting, diarrhea, constipation, dizziness, abdominal pain, skin rash, fevers, chills, night sweats, weight loss, swollen lymph nodes, body aches, joint swelling, muscle aches, chest pain, shortness of breath, mood changes.   Objective Blood pressure 116/76, pulse 62, weight 208 lb (94.348 kg), SpO2 97 %.  General: No apparent distress alert and oriented x3 mood and affect normal, dressed appropriately.  HEENT: Pupils equal, extraocular movements intact  Respiratory: Patient's speak in full sentences and does not appear short of breath  Cardiovascular: No lower extremity edema, non tender, no erythema  Skin: Warm dry intact with no signs of infection or rash on extremities or on axial skeleton.  Abdomen: Soft nontender  Neuro: Cranial nerves II through XII are intact, neurovascularly intact in all extremities with 2+ DTRs and 2+ pulses.  Lymph: No lymphadenopathy of posterior or anterior  cervical chain or axillae bilaterally.  Gait normal with good balance and coordination.  MSK:  Non tender with full range of motion and good stability and symmetric strength and tone of shoulders, elbows, wrist, hip, and ankles bilaterally. Mild osteophytic changes of multiple joints  Knee: Right Incision from patient's previous surgery well-healed.. Range of motion lacks last 2 of extension and has flexion to 95 Ligaments with solid consistent endpoints including ACL, PCL, LCL, MCL. Patient does have generalized tenderness to palpation but no erythema or warmth Non painful patellar compression. Patellar glide without crepitus. Patellar and quadriceps tendons unremarkable. Hamstring and quadriceps strength is normal.        Impression and Recommendations:     This case required medical decision making of moderate complexity.

## 2014-08-09 NOTE — Assessment & Plan Note (Signed)
Patient continues to have pain even though patient has had a total knee replacement. Do have a clicking sensation on range of motion that seems to give patient some pain. X-rays ordered today. We will also get labs to rule out any chromium or cobalt that would be consistent with a loosening. Patient could be having some mild allergy as well as possible scar tissue formation. Patient given range of motion exercises as well as patient was given a prescription for Singulair to see if this will be beneficial. Patient will try these different changes and come back and see me again in 4 weeks for further evaluation.

## 2014-08-12 LAB — CHROMIUM AND COBALT, WB (MOM)
Chromium: <1 ng/mL (ref <3.0)
Cobalt: <1 ng/mL (ref <3.0)

## 2014-08-17 ENCOUNTER — Telehealth: Payer: Self-pay | Admitting: Family Medicine

## 2014-08-17 NOTE — Telephone Encounter (Signed)
Caller name: Lorain Fettes Relationship to patient: self Can be reached: [phone]  Reason for call: Pt called to follow up on CT of abdomen. She said she was supposed to be contacted to schedule and she hasn't heard from anyone. I did not see order. Please notify pt of status.

## 2014-08-18 ENCOUNTER — Encounter: Payer: Self-pay | Admitting: Family Medicine

## 2014-08-18 DIAGNOSIS — Z0000 Encounter for general adult medical examination without abnormal findings: Secondary | ICD-10-CM | POA: Insufficient documentation

## 2014-08-18 DIAGNOSIS — E213 Hyperparathyroidism, unspecified: Secondary | ICD-10-CM

## 2014-08-18 DIAGNOSIS — R109 Unspecified abdominal pain: Secondary | ICD-10-CM | POA: Insufficient documentation

## 2014-08-18 HISTORY — DX: Hyperparathyroidism, unspecified: E21.3

## 2014-08-18 NOTE — Assessment & Plan Note (Signed)
Encouraged DASH diet, decrease po intake and increase exercise as tolerated. Needs 7-8 hours of sleep nightly. Avoid trans fats, eat small, frequent meals every 4-5 hours with lean proteins, complex carbs and healthy fats. Minimize simple carbs, GMO foods. 

## 2014-08-18 NOTE — Assessment & Plan Note (Signed)
Referred to endocrinology for further consideration

## 2014-08-18 NOTE — Assessment & Plan Note (Signed)
Normal today

## 2014-08-18 NOTE — Assessment & Plan Note (Signed)
Encouraged heart healthy diet, increase exercise, avoid trans fats, consider a krill oil cap daily 

## 2014-08-18 NOTE — Assessment & Plan Note (Signed)
Very low, is started on Vitamin D 50000 IU q week and a daily OTC tab, recheck in 3 months

## 2014-08-18 NOTE — Assessment & Plan Note (Signed)
Avoid offending foods, start probiotics. Do not eat large meals in late evening and consider raising head of bed.  

## 2014-08-18 NOTE — Assessment & Plan Note (Addendum)
Dr Annamaria Boots of pulmonology Dr Arlington Calix Dr Renda Rolls derm Dr Tamala Julian sports med Pap 2012, declines MGM may 2015, repeat q 2years Colonoscopy 2011, repeat in 10 years  Patient denies any difficulties at home. No trouble with ADLs, depression or falls. No recent changes to vision or hearing. Is UTD with immunizations. Is UTD with screening. Discussed Advanced Directives, patient agrees to bring [place] copies of documents if can. Encouraged heart healthy diet, exercise as tolerated and adequate sleep See problem list for risk factors See AVS for preventative health recommendations

## 2014-08-18 NOTE — Assessment & Plan Note (Signed)
hgba1c acceptable, minimize simple carbs. Increase exercise as tolerated. Continue current meds 

## 2014-08-18 NOTE — Assessment & Plan Note (Signed)
Persistent pain, intermittently intolerable at 9 of 10 pain level, also notes nausea, vomiting and diarrhea at times. Will proceed CT scan of abdomen due to persistence and severity of symptoms

## 2014-08-18 NOTE — Assessment & Plan Note (Signed)
Well controlled, no changes to meds. Encouraged heart healthy diet such as the DASH diet and exercise as tolerated.  °

## 2014-08-18 NOTE — Assessment & Plan Note (Signed)
Continues to struggle with daily pain, follows with Dr Alvan Dame, ortho

## 2014-08-20 ENCOUNTER — Ambulatory Visit (HOSPITAL_BASED_OUTPATIENT_CLINIC_OR_DEPARTMENT_OTHER)
Admission: RE | Admit: 2014-08-20 | Discharge: 2014-08-20 | Disposition: A | Discharge status: Home / Self Care | Payer: Medicare Other | Source: Ambulatory Visit | Attending: Family Medicine | Admitting: Family Medicine

## 2014-08-20 DIAGNOSIS — K838 Other specified diseases of biliary tract: Secondary | ICD-10-CM | POA: Insufficient documentation

## 2014-08-20 DIAGNOSIS — K439 Ventral hernia without obstruction or gangrene: Secondary | ICD-10-CM | POA: Insufficient documentation

## 2014-08-20 DIAGNOSIS — Z9884 Bariatric surgery status: Secondary | ICD-10-CM | POA: Insufficient documentation

## 2014-08-20 DIAGNOSIS — I700 Atherosclerosis of aorta: Secondary | ICD-10-CM | POA: Diagnosis not present

## 2014-08-20 DIAGNOSIS — R109 Unspecified abdominal pain: Secondary | ICD-10-CM

## 2014-08-20 MED ORDER — IOHEXOL 300 MG/ML  SOLN
100.0000 mL | Freq: Once | INTRAMUSCULAR | Status: AC | PRN
  Administered 2014-08-20: 100 mL via INTRAVENOUS

## 2014-08-22 NOTE — Assessment & Plan Note (Signed)
With worsening abdominal pain, some nausea, vomiting and diarrhea. Will proceed with CT scan to further investigate

## 2014-08-22 NOTE — Assessment & Plan Note (Signed)
Using CPAP 

## 2014-08-30 ENCOUNTER — Ambulatory Visit (INDEPENDENT_AMBULATORY_CARE_PROVIDER_SITE_OTHER): Payer: Medicare Other | Admitting: Family Medicine

## 2014-08-30 ENCOUNTER — Encounter: Payer: Self-pay | Admitting: Family Medicine

## 2014-08-30 VITALS — BP 108/68 | HR 58 | Ht 59.0 in | Wt 205.0 lb

## 2014-08-30 DIAGNOSIS — M25561 Pain in right knee: Secondary | ICD-10-CM

## 2014-08-30 NOTE — Patient Instructions (Signed)
[name] Continue the singulair [name] when you need it.  Continue the vitamins We will get you in to PT and try a couple things to get it better See me again in 4 weeks and if not better we may try an injection in  The bursae.

## 2014-08-30 NOTE — Progress Notes (Signed)
  [name] [place], [place] [address] Phone: [phone] Subjective:     CC: Right knee pain  KGY:[id number] [name] is a 69 y.o. female coming in with complaint of right knee pain. Patient has had a past medical history significant for moderate arthritis of this right knee. Patient's x-rays in 2013 were reviewed by me and does justify this diagnosis. Patient states soon after 2013 patient did have a knee replacement. Patient had this 2-1/2 years ago. Patient continued to have pain. We did get repeat x-rays as well as labs that did not show any significant loosening of the replacement. There is a concern the patient was may be having an allergy to the metal and patient was started on Singulair as well. Patient given some range of motion exercises. Patient states continues to have pain. Patient states though that she has been able to do a little more activity with less pain. Patient still states though that she still has the uncomfortable feeling nearly at all times. Patient denies though any locking or giving out on her or any instability.    Past medical history, social, surgical and family history all reviewed in electronic medical record.   Review of Systems: No headache, visual changes, nausea, vomiting, diarrhea, constipation, dizziness, abdominal pain, skin rash, fevers, chills, night sweats, weight loss, swollen lymph nodes, body aches, joint swelling, muscle aches, chest pain, shortness of breath, mood changes.   Objective Blood pressure 108/68, pulse 58, height 4\' 11"  (1.499 m), weight 205 lb (92.987 kg), SpO2 98 %.  General: No apparent distress alert and oriented x3 mood and affect normal, dressed appropriately.  HEENT: Pupils equal, extraocular movements intact  Respiratory: Patient's speak in full sentences and does not appear short of breath  Cardiovascular: No lower extremity edema, non tender, no erythema  Skin: Warm dry intact  with no signs of infection or rash on extremities or on axial skeleton.  Abdomen: Soft nontender  Neuro: Cranial nerves II through XII are intact, neurovascularly intact in all extremities with 2+ DTRs and 2+ pulses.  Lymph: No lymphadenopathy of posterior or anterior cervical chain or axillae bilaterally.  Gait normal with good balance and coordination.  MSK:  Non tender with full range of motion and good stability and symmetric strength and tone of shoulders, elbows, wrist, hip, and ankles bilaterally. Mild osteophytic changes of multiple joints  Knee: Right Incision from patient's previous surgery well-healed.. Range of motion lacks last 2 of extension and has flexion to 95 Ligaments with solid consistent endpoints including ACL, PCL, LCL, MCL. Continued generalized tenderness but more over the pes anserine area today. Non painful patellar compression. Patellar glide without crepitus. Patellar and quadriceps tendons unremarkable. Hamstring and quadriceps strength is normal.  Contralateral knee unremarkable      Impression and Recommendations:     This case required medical decision making of moderate complexity.

## 2014-08-30 NOTE — Assessment & Plan Note (Signed)
Patient continues to have pain on this right knee. I believe that there is some scar tissue formation that is likely contributing. Differential also includes a bursitis. There appears to be no loosening based on x-ray and labs. Patient will start with formal physical therapy and continue with the conservative therapy. We discussed different activities in proper shoe choices. Patient will come back and see me again in 4 weeks. At that time if continuing to have pain we would consider pes anserine injection.

## 2014-08-30 NOTE — Progress Notes (Signed)
Pre visit review using our clinic review tool, if applicable. No additional management support is needed unless otherwise documented below in the visit note. 

## 2014-09-03 ENCOUNTER — Other Ambulatory Visit: Payer: Self-pay

## 2014-09-03 DIAGNOSIS — Z1231 Encounter for screening mammogram for malignant neoplasm of breast: Secondary | ICD-10-CM

## 2014-09-06 ENCOUNTER — Other Ambulatory Visit: Payer: Self-pay | Admitting: Family Medicine

## 2014-09-07 ENCOUNTER — Ambulatory Visit: Payer: Medicare Other | Attending: Family Medicine | Admitting: Physical Therapy

## 2014-09-07 DIAGNOSIS — R6889 Other general symptoms and signs: Secondary | ICD-10-CM

## 2014-09-07 DIAGNOSIS — M25561 Pain in right knee: Secondary | ICD-10-CM | POA: Diagnosis not present

## 2014-09-07 DIAGNOSIS — R531 Weakness: Secondary | ICD-10-CM | POA: Insufficient documentation

## 2014-09-07 DIAGNOSIS — M25661 Stiffness of right knee, not elsewhere classified: Secondary | ICD-10-CM

## 2014-09-07 NOTE — Therapy (Signed)
[place], [place], [address] Phone: [phone]   Fax:  [phone]  Physical Therapy Evaluation  Patient Details  Name: [name] MRN: [id number] Date of Birth: [birth year] Referring Provider:  Mosie Lukes, MD  Encounter Date: 09/07/2014      PT End of Session - 09/07/14 0847    Visit Number 1   Number of Visits 8   Date for PT Re-Evaluation 10/05/14   PT Start Time 5462   PT Stop Time 0941   PT Time Calculation (min) 54 min   Activity Tolerance Patient tolerated treatment well   Behavior During Therapy [facility] for tasks assessed/performed      Past Medical History  Diagnosis Date  . Obesity   . Esophageal reflux   . Fibromyalgia   . Asthma   . Allergy     rhinitis  . Sleep apnea, obstructive   . Bronchitis, acute 10/31/2010  . Otitis media of both ears 11/16/2010  . Depression with anxiety 01/08/2011  . Back pain 01/08/2011  . Sinusitis 06/11/2011  . Fatigue 06/11/2011  . Knee pain, right 09/20/2011  . Otitis media 09/20/2011  . Varicose veins of lower limb 10/02/2011  . Otitis media of left ear 09/19/2010  . Motion sickness 12/24/2011  . Sinusitis 12/24/2011  . Nausea & vomiting 12/24/2011  . Heart murmur   . Panic attacks   . Arthritis 02-01-12    osteoarthritis, Back pain, spine surgeries ? retain hardware cervial and  lumbar.  . Anemia 02-01-12    iron absorption problem  . Cancer 02-01-12    squamous cell -face  . Allergic state 09/21/2010  . Osteopenia 08/05/2014  . Hyperparathyroidism 08/18/2014  . Abdominal pain 08/18/2014    Past Surgical History  Procedure Laterality Date  . Bariatric surgery    . Lumbar disc surgery    . Bladder suspension      complicated by bowel nick/ clolostomy  . Gallbladder surgery  1978  . Appendectomy  1962  . Gastric bypass  2003    Patient also noted "gastric bypass resection - 2004"  . Cervical disc surgery      C-7 in 2004, C-4 and C-5 in  2010  . Colostomy bag      Confirm with patient. Listed under medical conditions on form dated 07/18/09.  [name]      Per medical history form dated 07/18/09.  [name] Hernia repair  2009    Two hernias and abdominal reconstruction  . Ivc filter      prophyllactically- no hx DVT  . Carpal tunnel release  02-01-12    right  . Colostomy reversal  02-01-12  . Abdominal surgery  02-01-12    ABDOMINAL SURGERY  . Vulva surgery  02-01-12    cyst removal-pt 8 months pregnant  . Total knee arthroplasty  02/11/2012    Procedure: TOTAL KNEE ARTHROPLASTY;  Surgeon: Mauri Pole, MD;  Location: [facility];  Service: Orthopedics;  Laterality: Right;    There were no vitals filed for this visit.  Visit Diagnosis:  Right knee pain - Plan: PT PLAN OF CARE CERT/RE-CERT  Generalized weakness - Plan: PT PLAN OF CARE CERT/RE-CERT  Activity intolerance - Plan: PT PLAN OF CARE CERT/RE-CERT  Stiffness of right knee - Plan: PT PLAN OF CARE CERT/RE-CERT      Subjective Assessment - 09/07/14 0851    Subjective Patient had  Rt TKR 02/13/12. She had a very rough rehab (  5 months). She had to RTW and she works full time now. She can't walk two blocks. 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 Steps "kill" her.  Unable to carry grandson 16 mo. Mentally she is very depressed  about it. She was walking miles prior to it getting bad.  Patient occassionally uses a walker when the swelling is really bad  1-2x every six weeks. She may have leg length discrepancy and has a heel lift.   Pertinent History ventral hernia, RT TKR 02/13/12, two cervical and 3-4 lumbar disc surgeries   Limitations Walking   How long can you sit comfortably? one hour   How long can you walk comfortably? less than two blocks   Patient Stated Goals able to wak and do steps   Currently in Pain? Yes   Pain Score 6    Pain Location Knee   Pain Orientation Right   Pain Descriptors / Indicators Aching   Pain Type Chronic  pain   Pain Onset More than a month ago   Pain Frequency Constant   Aggravating Factors  walking, stairs   Pain Relieving Factors meds, ice    Effect of Pain on Daily Activities step to gait on stairs, can't walk or get down on floor            [facility] PT Assessment - 09/07/14 0001    Assessment   Medical Diagnosis Rt knee pain   Onset Date/Surgical Date 02/13/12   Next MD Visit 3 weeks   Prior Therapy in 2014   Precautions   Precautions None   Restrictions   Weight Bearing Restrictions No   Balance Screen   Has the patient fallen in the past 6 months Yes   How many times? 1   Has the patient had a decrease in activity level because of a fear of falling?  Yes   Is the patient reluctant to leave their home because of a fear of falling?  No   Home Ecologist residence   Living Arrangements Spouse/significant other   Home Layout One level  at sons a lot who has 2 level   Prior Function   Level of Independence Independent   Vocation Full time employment   Vocation Requirements sitting   Observation/Other Assessments   Focus on Therapeutic Outcomes (FOTO)  66% limited   ROM / Strength   AROM / PROM / Strength AROM;Strength   AROM   AROM Assessment Site Knee   Right/Left Knee Right;Left   Right Knee Extension 0   Right Knee Flexion 109  112 passive   Strength   Strength Assessment Site Hip;Knee   Right/Left Hip Right;Left   Right Hip Flexion 4-/5   Right Hip Extension 4-/5   Right Hip ABduction 4-/5   Left Hip Flexion 5/5   Left Hip Extension 4+/5   Left Hip ABduction 4-/5   Right/Left Knee Right;Left   Right Knee Flexion 5/5   Right Knee Extension 5/5   Left Knee Flexion 5/5   Left Knee Extension 5/5   Flexibility   Soft Tissue Assessment /Muscle Length --  Rt piriformis and ITB tightness   Palpation   Patella mobility decreased lateral glide  patella feels tilted right   Palpation comment marked tenderness at quad tendon  insertion and medial knee.   Special Tests    Special Tests --  + Ober on RT  [facility] PT Treatment/Exercise - 2014/09/28 0001    Modalities   Modalities Electrical Stimulation   Electrical Stimulation   Electrical Stimulation Location right knee   Electrical Stimulation Action ifc x 15 min   Electrical Stimulation Parameters 80-150 hz   Electrical Stimulation Goals Pain                PT Education - 09/28/14 1557    Education provided Yes   Education Details hep: HS, ITB with strap and piriformis stretch   Person(s) Educated Patient   Methods Explanation;Demonstration;Handout   Comprehension Returned demonstration;Verbalized understanding          PT Short Term Goals - 09-28-14 1604    PT SHORT TERM GOAL #1   Title I with initial HEP   Time 2   Period Weeks   Status New           PT Long Term Goals - 09/28/14 1605    PT LONG TERM GOAL #1   Title I with advanced HEP   Time 4   Period Weeks   Status New   PT LONG TERM GOAL #2   Title improved R hip strength to 4+/5 or better to improve funciton   Time 4   Period Weeks   Status New   PT LONG TERM GOAL #3   Title decreased pain with ambulation by 40%   Time 4   Period Weeks   Status New   PT LONG TERM GOAL #4   Title able to climb stairs with 25% less pain               Plan - 09-28-14 1559    Clinical Impression Statement [name] is a 69 year old female who had a R TKR 02/13/12. She has had pain ever since, mainly with walking and stairs. Patient was very active prior to surgery and also works full time. She lives in a one story home, but is at her sons a lot and will be moving there eventually. He has a two story home. She is unable to walk for exercise and is limited to two blocks. She has decreased R hip strength and tight R piriformis, HS and ITB. Patient will benefit from manual PT and strengthening to correct these deficits as well as modalities for pain. If  she reponds well to estim a TENS unit would be indicated.   Pt will benefit from skilled therapeutic intervention in order to improve on the following deficits Decreased range of motion;Decreased endurance;Decreased activity tolerance;Pain;Impaired flexibility;Decreased strength;Increased edema   Rehab Potential Good   PT Frequency 2x / week   PT Duration 4 weeks   PT Treatment/Interventions ADLs/Self Care Home Management;Electrical Stimulation;Cryotherapy;Iontophoresis 4mg /ml Dexamethasone;Ultrasound;Gait training;Stair training;Therapeutic exercise;Manual techniques;Patient/family education;Neuromuscular re-education;Passive range of motion;Dry needling;Taping;Vasopneumatic Device   PT Next Visit Plan review HEP, hip strengthening, assess estim, STW to ITB   Consulted and Agree with Plan of Care Patient          G-Codes - 28-Sep-2014 1608    Functional Assessment Tool Used foto   Functional Limitation Mobility: Walking and moving around   Mobility: Walking and Moving Around Current Status [phone]) At least 60 percent but less than 80 percent impaired, limited or restricted   Mobility: Walking and Moving Around Goal Status [phone]) At least 40 percent but less than 60 percent impaired, limited or restricted       Problem List Patient Active Problem List   Diagnosis Date Noted  . Hyperparathyroidism 08/18/2014  .  Abdominal pain 08/18/2014  . Medicare annual wellness visit, subsequent 08/18/2014  . Osteopenia 08/05/2014  . Capsulitis of ankle 02/23/2014  . Leg length discrepancy 02/23/2014  . Plantar fasciitis of left foot 11/17/2013  . Pruritus 10/03/2013  . Narcolepsy without cataplexy 02/08/2013  . S/P right TKA 02/11/2012  . Motion sickness 12/24/2011  . Nausea & vomiting 12/24/2011  . Varicose veins of lower limb 10/02/2011  . Knee pain, right 09/20/2011  . Depression with anxiety 01/08/2011  . Back pain 01/08/2011  . Allergic rhinoconjunctivitis, seasonal and perennial  09/21/2010  . SCC (squamous cell carcinoma), face 09/19/2010  . Otitis media of left ear 09/19/2010  . Chills 07/12/2010  . Menopause 07/12/2010  . Ventral hernia 04/06/2010  . COUGH 04/06/2010  . ABDOMINAL PAIN, GENERALIZED 04/06/2010  . ENCOPRESIS 03/21/2010  . DERMATITIS, ATOPIC 03/21/2010  . Diabetes mellitus type 2 in obese 03/07/2010  . DIABETIC  RETINOPATHY 03/07/2010  . OTHER HYPERPARATHYROIDISM 03/07/2010  . Vitamin D deficiency 03/07/2010  . Hyperlipidemia, mixed 03/07/2010  . Hypocalcemia 03/07/2010  . Iron deficiency anemia 03/07/2010  . TREMOR, ESSENTIAL 03/07/2010  . MITRAL REGURGITATION, 0 (MILD) 03/07/2010  . TRICUSPID REGURGITATION, MILD 03/07/2010  . ESSENTIAL HYPERTENSION, BENIGN 03/07/2010  . CARDIOMEGALY, MILD 03/07/2010  . DEGENERATIVE JOINT DISEASE, LEFT HIP 03/07/2010  . Disorder of bone and cartilage 03/07/2010  . Diarrhea 03/07/2010  . UNSPECIFIED URINARY INCONTINENCE 03/07/2010  . PERSONAL HX OF METHICILLIN RESIST STAPH AUREUS 03/07/2010  . DYSPNEA 11/24/2007  . Obesity 02/27/2007  . Obstructive sleep apnea 02/27/2007  . Seasonal and perennial allergic rhinitis 02/27/2007  . ESOPHAGEAL REFLUX 02/27/2007  . FIBROMYALGIA 02/27/2007   Madelyn Flavors PT  09/07/2014, 4:12 PM  [facility] [address] [place], [place], [address] Phone: [phone]   Fax:  [phone]

## 2014-09-07 NOTE — Patient Instructions (Signed)
  Hamstring Stretch, Reclined (Strap, Doorframe)   Lengthen bottom leg on floor. Extend top leg along edge of doorframe or press foot up into yoga strap. Hold for 30____ breaths. Repeat 3____ times each leg.   Outer Hip Stretch: Reclined IT Band Stretch (Strap)   Strap around opposite foot, pull across only as far as possible with shoulders on mat. Hold for __30__ secondss. Repeat __3__ times each leg.  Copyright  VHI. All rights reserved.   Hip Stretch  Put right ankle over left knee. Let right knee fall downward, but keep ankle in place. Feel the stretch in hip. May push down gently with hand to feel stretch. Hold _30_ seconds while counting out loud. Repeat with other leg. Repeat __3__ times. Do __2-3_ sessions per day.   Stretching: Piriformis (Supine)  Pull right knee toward opposite shoulder. Hold __30__ seconds. Relax. Repeat __3 times per set. Do ____ sets per session. Do _2-3__ sessions per day.   Madelyn Flavors, PT 09/07/2014 9:29 AM [place]

## 2014-09-13 ENCOUNTER — Ambulatory Visit: Payer: Medicare Other | Admitting: Physical Therapy

## 2014-09-13 ENCOUNTER — Encounter: Payer: Medicare Other | Admitting: Physical Therapy

## 2014-09-13 DIAGNOSIS — M25661 Stiffness of right knee, not elsewhere classified: Secondary | ICD-10-CM

## 2014-09-13 DIAGNOSIS — R6889 Other general symptoms and signs: Secondary | ICD-10-CM | POA: Diagnosis not present

## 2014-09-13 DIAGNOSIS — M25561 Pain in right knee: Secondary | ICD-10-CM | POA: Diagnosis not present

## 2014-09-13 DIAGNOSIS — R531 Weakness: Secondary | ICD-10-CM | POA: Diagnosis not present

## 2014-09-13 NOTE — Therapy (Signed)
[place], [place], [address] Phone: [phone]   Fax:  [phone]  Physical Therapy Treatment  Patient Details  Name: [name] MRN: [id number] Date of Birth: September 25, 1945 Referring Provider:  Mosie Lukes, MD  Encounter Date: 09/13/2014      PT End of Session - 09/13/14 0854    Visit Number 2   Number of Visits 8   Date for PT Re-Evaluation 10/05/14   PT Start Time 0732   PT Stop Time 0801   PT Time Calculation (min) 29 min   Activity Tolerance Patient tolerated treatment well;No increased pain   Behavior During Therapy [facility] for tasks assessed/performed      Past Medical History  Diagnosis Date  . Obesity   . Esophageal reflux   . Fibromyalgia   . Asthma   . Allergy     rhinitis  . Sleep apnea, obstructive   . Bronchitis, acute 10/31/2010  . Otitis media of both ears 11/16/2010  . Depression with anxiety 01/08/2011  . Back pain 01/08/2011  . Sinusitis 06/11/2011  . Fatigue 06/11/2011  . Knee pain, right 09/20/2011  . Otitis media 09/20/2011  . Varicose veins of lower limb 10/02/2011  . Otitis media of left ear 09/19/2010  . Motion sickness 12/24/2011  . Sinusitis 12/24/2011  . Nausea & vomiting 12/24/2011  . Heart murmur   . Panic attacks   . Arthritis 02-01-12    osteoarthritis, Back pain, spine surgeries ? retain hardware cervial and  lumbar.  . Anemia 02-01-12    iron absorption problem  . Cancer 02-01-12    squamous cell -face  . Allergic state 09/21/2010  . Osteopenia 08/05/2014  . Hyperparathyroidism 08/18/2014  . Abdominal pain 08/18/2014    Past Surgical History  Procedure Laterality Date  . Bariatric surgery    . Lumbar disc surgery    . Bladder suspension      complicated by bowel nick/ clolostomy  . Gallbladder surgery  1978  . Appendectomy  1962  . Gastric bypass  2003    Patient also noted "gastric bypass resection - 2004"  . Cervical disc surgery      C-7 in 2004,  C-4 and C-5 in 2010  . Colostomy bag      Confirm with patient. Listed under medical conditions on form dated 07/18/09.  [name]      Per medical history form dated 07/18/09.  [name] Hernia repair  2009    Two hernias and abdominal reconstruction  . Ivc filter      prophyllactically- no hx DVT  . Carpal tunnel release  02-01-12    right  . Colostomy reversal  02-01-12  . Abdominal surgery  02-01-12    ABDOMINAL SURGERY  . Vulva surgery  02-01-12    cyst removal-pt 8 months pregnant  . Total knee arthroplasty  02/11/2012    Procedure: TOTAL KNEE ARTHROPLASTY;  Surgeon: Mauri Pole, MD;  Location: [facility];  Service: Orthopedics;  Laterality: Right;    There were no vitals filed for this visit.  Visit Diagnosis:  Right knee pain  Generalized weakness  Activity intolerance  Stiffness of right knee      Subjective Assessment - 09/13/14 0740    Subjective 7/10  Stinging burning.  Knee has been swelling.  Nerve pain.  Heavy feeling  Tens helped  but it came back even stronger.  [facility] Adult PT Treatment/Exercise - 09/13/14 0734    Self-Care   ADL's Encouraged good posture with ADL's.  Some modifications reviewed.     Knee/Hip Exercises: Supine   Other Supine Knee/Hip Exercises gluteal sets 10 reps 5 seconds.   Manual Therapy   Manual therapy comments Soft tissue work RT IT band with foam roller and manually.  Quads and medial hamstrings also treated manuallt.  Very tender.  Tissue softened proximal quads and other areas.  Tape kinesiotex for  edema Patellar tendon support and to create space to distal IT band.                  PT Education - 09/13/14 [phone]    Education provided Yes   Education Details ADL handout.  No bending unsupproted.   Person(s) Educated Patient   Methods Explanation;Handout   Comprehension Verbalized understanding          PT Short Term Goals - 09/13/14 0946    PT SHORT TERM GOAL #1   Title I with  initial HEP   Time 2   Period Weeks   Status On-going           PT Long Term Goals - 09/07/14 1605    PT LONG TERM GOAL #1   Title I with advanced HEP   Time 4   Period Weeks   Status New   PT LONG TERM GOAL #2   Title improved R hip strength to 4+/5 or better to improve funciton   Time 4   Period Weeks   Status New   PT LONG TERM GOAL #3   Title decreased pain with ambulation by 40%   Time 4   Period Weeks   Status New   PT LONG TERM GOAL #4   Title able to climb stairs with 25% less pain               Plan - 09/13/14 0945    PT Next Visit Plan Home exercise progression.  Answer ADL questions.   Consulted and Agree with Plan of Care Patient        Problem List Patient Active Problem List   Diagnosis Date Noted  . Hyperparathyroidism 08/18/2014  . Abdominal pain 08/18/2014  . Medicare annual wellness visit, subsequent 08/18/2014  . Osteopenia 08/05/2014  . Capsulitis of ankle 02/23/2014  . Leg length discrepancy 02/23/2014  . Plantar fasciitis of left foot 11/17/2013  . Pruritus 10/03/2013  . Narcolepsy without cataplexy 02/08/2013  . S/P right TKA 02/11/2012  . Motion sickness 12/24/2011  . Nausea & vomiting 12/24/2011  . Varicose veins of lower limb 10/02/2011  . Knee pain, right 09/20/2011  . Depression with anxiety 01/08/2011  . Back pain 01/08/2011  . Allergic rhinoconjunctivitis, seasonal and perennial 09/21/2010  . SCC (squamous cell carcinoma), face 09/19/2010  . Otitis media of left ear 09/19/2010  . Chills 07/12/2010  . Menopause 07/12/2010  . Ventral hernia 04/06/2010  . COUGH 04/06/2010  . ABDOMINAL PAIN, GENERALIZED 04/06/2010  . ENCOPRESIS 03/21/2010  . DERMATITIS, ATOPIC 03/21/2010  . Diabetes mellitus type 2 in obese 03/07/2010  . DIABETIC  RETINOPATHY 03/07/2010  . OTHER HYPERPARATHYROIDISM 03/07/2010  . Vitamin D deficiency 03/07/2010  . Hyperlipidemia, mixed 03/07/2010  . Hypocalcemia 03/07/2010  . Iron deficiency  anemia 03/07/2010  . TREMOR, ESSENTIAL 03/07/2010  . MITRAL REGURGITATION, 0 (MILD) 03/07/2010  . TRICUSPID REGURGITATION, MILD 03/07/2010  . ESSENTIAL HYPERTENSION, BENIGN 03/07/2010  . CARDIOMEGALY, MILD 03/07/2010  . DEGENERATIVE  JOINT DISEASE, LEFT HIP 03/07/2010  . Disorder of bone and cartilage 03/07/2010  . Diarrhea 03/07/2010  . UNSPECIFIED URINARY INCONTINENCE 03/07/2010  . PERSONAL HX OF METHICILLIN RESIST STAPH AUREUS 03/07/2010  . DYSPNEA 11/24/2007  . Obesity 02/27/2007  . Obstructive sleep apnea 02/27/2007  . Seasonal and perennial allergic rhinitis 02/27/2007  . ESOPHAGEAL REFLUX 02/27/2007  . FIBROMYALGIA 02/27/2007    HARRIS,KAREN 09/13/2014, 9:48 AM  [facility] [address] [place], [place], [address] Phone: [phone]   Fax:  [phone]     Melvenia Needles, PTA 09/13/2014 9:48 AM Phone: [phone] Fax: [phone]

## 2014-09-13 NOTE — Patient Instructions (Signed)

## 2014-09-16 ENCOUNTER — Ambulatory Visit: Payer: Medicare Other | Admitting: Physical Therapy

## 2014-09-16 DIAGNOSIS — M25561 Pain in right knee: Secondary | ICD-10-CM

## 2014-09-16 DIAGNOSIS — R531 Weakness: Secondary | ICD-10-CM | POA: Diagnosis not present

## 2014-09-16 DIAGNOSIS — R6889 Other general symptoms and signs: Secondary | ICD-10-CM

## 2014-09-16 DIAGNOSIS — M25661 Stiffness of right knee, not elsewhere classified: Secondary | ICD-10-CM

## 2014-09-16 NOTE — Therapy (Signed)
[place], [place], [address] Phone: [phone]   Fax:  [phone]  Physical Therapy Treatment  Patient Details  Name: [name] MRN: [id number] Date of Birth: [birth year] Referring Provider:  Mosie Lukes, MD  Encounter Date: 09/16/2014      PT End of Session - 09/16/14 1729    Visit Number 3   Date for PT Re-Evaluation 10/05/14   PT Start Time 7169   PT Stop Time 6789   PT Time Calculation (min) 31 min   Activity Tolerance Patient tolerated treatment well;No increased pain      Past Medical History  Diagnosis Date  . Obesity   . Esophageal reflux   . Fibromyalgia   . Asthma   . Allergy     rhinitis  . Sleep apnea, obstructive   . Bronchitis, acute 10/31/2010  . Otitis media of both ears 11/16/2010  . Depression with anxiety 01/08/2011  . Back pain 01/08/2011  . Sinusitis 06/11/2011  . Fatigue 06/11/2011  . Knee pain, right 09/20/2011  . Otitis media 09/20/2011  . Varicose veins of lower limb 10/02/2011  . Otitis media of left ear 09/19/2010  . Motion sickness 12/24/2011  . Sinusitis 12/24/2011  . Nausea & vomiting 12/24/2011  . Heart murmur   . Panic attacks   . Arthritis 02-01-12    osteoarthritis, Back pain, spine surgeries ? retain hardware cervial and  lumbar.  . Anemia 02-01-12    iron absorption problem  . Cancer 02-01-12    squamous cell -face  . Allergic state 09/21/2010  . Osteopenia 08/05/2014  . Hyperparathyroidism 08/18/2014  . Abdominal pain 08/18/2014    Past Surgical History  Procedure Laterality Date  . Bariatric surgery    . Lumbar disc surgery    . Bladder suspension      complicated by bowel nick/ clolostomy  . Gallbladder surgery  1978  . Appendectomy  1962  . Gastric bypass  2003    Patient also noted "gastric bypass resection - 2004"  . Cervical disc surgery      C-7 in 2004, C-4 and C-5 in 2010  . Colostomy bag      Confirm with patient. Listed under  medical conditions on form dated 07/18/09.  [name]      Per medical history form dated 07/18/09.  [name] Hernia repair  2009    Two hernias and abdominal reconstruction  . Ivc filter      prophyllactically- no hx DVT  . Carpal tunnel release  02-01-12    right  . Colostomy reversal  02-01-12  . Abdominal surgery  02-01-12    ABDOMINAL SURGERY  . Vulva surgery  02-01-12    cyst removal-pt 8 months pregnant  . Total knee arthroplasty  02/11/2012    Procedure: TOTAL KNEE ARTHROPLASTY;  Surgeon: Mauri Pole, MD;  Location: [facility];  Service: Orthopedics;  Laterality: Right;    There were no vitals filed for this visit.  Visit Diagnosis:  Right knee pain  Generalized weakness  Activity intolerance  Stiffness of right knee      Subjective Assessment - 09/16/14 1726    Subjective 8/10 pain.  I have been standing all day. T ape helped.  Manual helped.  I have to go to the hospital after this. (Shorter session)                         [facility]  PT Treatment/Exercise - 09/16/14 1635    Knee/Hip Exercises: Aerobic   Nustep L5 7 minutes   Knee/Hip Exercises: Supine   Heel Slides 5 reps   Bridges Limitations 10 X   Manual Therapy   Manual therapy comments Soft tissue work RT IT band with foam roller and manually.  Quads and medial hamstrings also treated manuallt.  Very tender.  Tissue softened proximal quads and other areas.  Tape kinesiotex for  edema Patellar tendon support and to create space to distal IT band.    also taped hamstrings and retrograde to hamstrings for edema                  PT Short Term Goals - 09/13/14 0946    PT SHORT TERM GOAL #1   Title I with initial HEP   Time 2   Period Weeks   Status On-going           PT Long Term Goals - 09/07/14 1605    PT LONG TERM GOAL #1   Title I with advanced HEP   Time 4   Period Weeks   Status New   PT LONG TERM GOAL #2   Title improved R hip strength to 4+/5 or better to improve  funciton   Time 4   Period Weeks   Status New   PT LONG TERM GOAL #3   Title decreased pain with ambulation by 40%   Time 4   Period Weeks   Status New   PT LONG TERM GOAL #4   Title able to climb stairs with 25% less pain               Plan - 09/16/14 1730    Clinical Impression Statement Shorter session dur to family emergency.  Gentle exercises and manual needed today.  Will be ready for more exercise next visit.   PT Next Visit Plan Home exercise progression.  Answer ADL questions.Knee strength level 2.   Consulted and Agree with Plan of Care Patient        Problem List Patient Active Problem List   Diagnosis Date Noted  . Hyperparathyroidism 08/18/2014  . Abdominal pain 08/18/2014  . Medicare annual wellness visit, subsequent 08/18/2014  . Osteopenia 08/05/2014  . Capsulitis of ankle 02/23/2014  . Leg length discrepancy 02/23/2014  . Plantar fasciitis of left foot 11/17/2013  . Pruritus 10/03/2013  . Narcolepsy without cataplexy 02/08/2013  . S/P right TKA 02/11/2012  . Motion sickness 12/24/2011  . Nausea & vomiting 12/24/2011  . Varicose veins of lower limb 10/02/2011  . Knee pain, right 09/20/2011  . Depression with anxiety 01/08/2011  . Back pain 01/08/2011  . Allergic rhinoconjunctivitis, seasonal and perennial 09/21/2010  . SCC (squamous cell carcinoma), face 09/19/2010  . Otitis media of left ear 09/19/2010  . Chills 07/12/2010  . Menopause 07/12/2010  . Ventral hernia 04/06/2010  . COUGH 04/06/2010  . ABDOMINAL PAIN, GENERALIZED 04/06/2010  . ENCOPRESIS 03/21/2010  . DERMATITIS, ATOPIC 03/21/2010  . Diabetes mellitus type 2 in obese 03/07/2010  . DIABETIC  RETINOPATHY 03/07/2010  . OTHER HYPERPARATHYROIDISM 03/07/2010  . Vitamin D deficiency 03/07/2010  . Hyperlipidemia, mixed 03/07/2010  . Hypocalcemia 03/07/2010  . Iron deficiency anemia 03/07/2010  . TREMOR, ESSENTIAL 03/07/2010  . MITRAL REGURGITATION, 0 (MILD) 03/07/2010  .  TRICUSPID REGURGITATION, MILD 03/07/2010  . ESSENTIAL HYPERTENSION, BENIGN 03/07/2010  . CARDIOMEGALY, MILD 03/07/2010  . DEGENERATIVE JOINT DISEASE, LEFT HIP 03/07/2010  . Disorder of bone  and cartilage 03/07/2010  . Diarrhea 03/07/2010  . UNSPECIFIED URINARY INCONTINENCE 03/07/2010  . PERSONAL HX OF METHICILLIN RESIST STAPH AUREUS 03/07/2010  . DYSPNEA 11/24/2007  . Obesity 02/27/2007  . Obstructive sleep apnea 02/27/2007  . Seasonal and perennial allergic rhinitis 02/27/2007  . ESOPHAGEAL REFLUX 02/27/2007  . FIBROMYALGIA 02/27/2007    Micalah Cabezas 09/16/2014, 5:33 PM  [facility] [address] [place], [place], [address] Phone: [phone]   Fax:  [phone]     Melvenia Needles, PTA 09/16/2014 5:33 PM Phone: [phone] Fax: [phone]

## 2014-09-20 ENCOUNTER — Encounter: Payer: Medicare Other | Admitting: Physical Therapy

## 2014-09-20 ENCOUNTER — Ambulatory Visit: Payer: Medicare Other | Admitting: Physical Therapy

## 2014-09-20 DIAGNOSIS — M25661 Stiffness of right knee, not elsewhere classified: Secondary | ICD-10-CM | POA: Diagnosis not present

## 2014-09-20 DIAGNOSIS — M25561 Pain in right knee: Secondary | ICD-10-CM | POA: Diagnosis not present

## 2014-09-20 DIAGNOSIS — R6889 Other general symptoms and signs: Secondary | ICD-10-CM

## 2014-09-20 DIAGNOSIS — R531 Weakness: Secondary | ICD-10-CM

## 2014-09-20 NOTE — Therapy (Signed)
[place], [place], [address] Phone: [phone]   Fax:  [phone]  Physical Therapy Treatment  Patient Details  Name: [name] MRN: [id number] Date of Birth: 14-May-1945 Referring Provider:  Mosie Lukes, MD  Encounter Date: 09/20/2014      PT End of Session - 09/20/14 0759    Visit Number 4   Date for PT Re-Evaluation 10/05/14   PT Start Time 0735   PT Stop Time 0800   PT Time Calculation (min) 25 min   Activity Tolerance Patient tolerated treatment well;Patient limited by pain   Behavior During Therapy [facility] for tasks assessed/performed      Past Medical History  Diagnosis Date  . Obesity   . Esophageal reflux   . Fibromyalgia   . Asthma   . Allergy     rhinitis  . Sleep apnea, obstructive   . Bronchitis, acute 10/31/2010  . Otitis media of both ears 11/16/2010  . Depression with anxiety 01/08/2011  . Back pain 01/08/2011  . Sinusitis 06/11/2011  . Fatigue 06/11/2011  . Knee pain, right 09/20/2011  . Otitis media 09/20/2011  . Varicose veins of lower limb 10/02/2011  . Otitis media of left ear 09/19/2010  . Motion sickness 12/24/2011  . Sinusitis 12/24/2011  . Nausea & vomiting 12/24/2011  . Heart murmur   . Panic attacks   . Arthritis 02-01-12    osteoarthritis, Back pain, spine surgeries ? retain hardware cervial and  lumbar.  . Anemia 02-01-12    iron absorption problem  . Cancer 02-01-12    squamous cell -face  . Allergic state 09/21/2010  . Osteopenia 08/05/2014  . Hyperparathyroidism 08/18/2014  . Abdominal pain 08/18/2014    Past Surgical History  Procedure Laterality Date  . Bariatric surgery    . Lumbar disc surgery    . Bladder suspension      complicated by bowel nick/ clolostomy  . Gallbladder surgery  1978  . Appendectomy  1962  . Gastric bypass  2003    Patient also noted "gastric bypass resection - 2004"  . Cervical disc surgery      C-7 in 2004, C-4 and C-5 in  2010  . Colostomy bag      Confirm with patient. Listed under medical conditions on form dated 07/18/09.  [name]      Per medical history form dated 07/18/09.  [name] Hernia repair  2009    Two hernias and abdominal reconstruction  . Ivc filter      prophyllactically- no hx DVT  . Carpal tunnel release  02-01-12    right  . Colostomy reversal  02-01-12  . Abdominal surgery  02-01-12    ABDOMINAL SURGERY  . Vulva surgery  02-01-12    cyst removal-pt 8 months pregnant  . Total knee arthroplasty  02/11/2012    Procedure: TOTAL KNEE ARTHROPLASTY;  Surgeon: Mauri Pole, MD;  Location: [facility];  Service: Orthopedics;  Laterality: Right;    There were no vitals filed for this visit.  Visit Diagnosis:  Right knee pain  Generalized weakness  Activity intolerance  Stiffness of right knee      Subjective Assessment - 09/20/14 0737    Subjective Pain real bad after getting in the pool.  I felt I was kicking sideways.   It hurt way up there.  I had aching after I left the other day.  I just got through it.   Currently in Pain?  No/denies   Pain Score --  Way up there   Pain Location Knee   Pain Orientation Right   Pain Descriptors / Indicators --  No number gived when asked several times.  No pain descrption  when asked.   Aggravating Factors  sitting in car,  Swimming kicks   Pain Relieving Factors motrin.  Took 4 yesterday                         [facility] Adult PT Treatment/Exercise - 09/20/14 0741    Lumbar Exercises: Seated   Long Arc Quad on Chair 10 reps  2 with 4 LBS rest 0 LBS de to pain 10 second hold eccentreic   LAQ on Chair Weights (lbs) 4   LAQ on Chair Limitations AA lift   Knee/Hip Exercises: Aerobic   Nustep L 5 , 5 minutes.   Knee/Hip Exercises: Supine   Quad Sets AROM;Right;1 set;10 reps   Quad Sets Limitations 1 set of 10 with ball squeeze  noted lateral tracking patrlla. Wedge used to help back pain   Bridges Limitations 10 X   Knee/Hip  Exercises: Sidelying   Hip ABduction 10 reps   Clams 10 repsRt                  PT Short Term Goals - 09/20/14 0753    PT SHORT TERM GOAL #1   Title I with initial HEP   Baseline has memorized   Time 2   Period Weeks   Status Achieved           PT Long Term Goals - 09/07/14 1605    PT LONG TERM GOAL #1   Title I with advanced HEP   Time 4   Period Weeks   Status New   PT LONG TERM GOAL #2   Title improved R hip strength to 4+/5 or better to improve funciton   Time 4   Period Weeks   Status New   PT LONG TERM GOAL #3   Title decreased pain with ambulation by 40%   Time 4   Period Weeks   Status New   PT LONG TERM GOAL #4   Title able to climb stairs with 25% less pain               Plan - 09/20/14 0800    Clinical Impression Statement 5/10 achey post session.  Declined cold pack.  Patella laterally tracking.  Needs more strengthening.  STG # 1 met.   PT Next Visit Plan hip abd for home.  Tape lateral ttracking?     Consulted and Agree with Plan of Care Patient        Problem List Patient Active Problem List   Diagnosis Date Noted  . Hyperparathyroidism 08/18/2014  . Abdominal pain 08/18/2014  . Medicare annual wellness visit, subsequent 08/18/2014  . Osteopenia 08/05/2014  . Capsulitis of ankle 02/23/2014  . Leg length discrepancy 02/23/2014  . Plantar fasciitis of left foot 11/17/2013  . Pruritus 10/03/2013  . Narcolepsy without cataplexy 02/08/2013  . S/P right TKA 02/11/2012  . Motion sickness 12/24/2011  . Nausea & vomiting 12/24/2011  . Varicose veins of lower limb 10/02/2011  . Knee pain, right 09/20/2011  . Depression with anxiety 01/08/2011  . Back pain 01/08/2011  . Allergic rhinoconjunctivitis, seasonal and perennial 09/21/2010  . SCC (squamous cell carcinoma), face 09/19/2010  . Otitis media of left ear 09/19/2010  . Chills 07/12/2010  .  Menopause 07/12/2010  . Ventral hernia 04/06/2010  . COUGH 04/06/2010  .  ABDOMINAL PAIN, GENERALIZED 04/06/2010  . ENCOPRESIS 03/21/2010  . DERMATITIS, ATOPIC 03/21/2010  . Diabetes mellitus type 2 in obese 03/07/2010  . DIABETIC  RETINOPATHY 03/07/2010  . OTHER HYPERPARATHYROIDISM 03/07/2010  . Vitamin D deficiency 03/07/2010  . Hyperlipidemia, mixed 03/07/2010  . Hypocalcemia 03/07/2010  . Iron deficiency anemia 03/07/2010  . TREMOR, ESSENTIAL 03/07/2010  . MITRAL REGURGITATION, 0 (MILD) 03/07/2010  . TRICUSPID REGURGITATION, MILD 03/07/2010  . ESSENTIAL HYPERTENSION, BENIGN 03/07/2010  . CARDIOMEGALY, MILD 03/07/2010  . DEGENERATIVE JOINT DISEASE, LEFT HIP 03/07/2010  . Disorder of bone and cartilage 03/07/2010  . Diarrhea 03/07/2010  . UNSPECIFIED URINARY INCONTINENCE 03/07/2010  . PERSONAL HX OF METHICILLIN RESIST STAPH AUREUS 03/07/2010  . DYSPNEA 11/24/2007  . Obesity 02/27/2007  . Obstructive sleep apnea 02/27/2007  . Seasonal and perennial allergic rhinitis 02/27/2007  . ESOPHAGEAL REFLUX 02/27/2007  . FIBROMYALGIA 02/27/2007    HARRIS,KAREN 09/20/2014, 8:02 AM  [facility] [address] [place], [place], [address] Phone: [phone]   Fax:  [phone]     Melvenia Needles, PTA 09/20/2014 8:02 AM Phone: [phone] Fax: [phone]

## 2014-09-22 ENCOUNTER — Ambulatory Visit: Payer: Medicare Other | Admitting: Physical Therapy

## 2014-09-22 ENCOUNTER — Encounter: Payer: Medicare Other | Admitting: Physical Therapy

## 2014-09-22 DIAGNOSIS — R6889 Other general symptoms and signs: Secondary | ICD-10-CM | POA: Diagnosis not present

## 2014-09-22 DIAGNOSIS — M25661 Stiffness of right knee, not elsewhere classified: Secondary | ICD-10-CM

## 2014-09-22 DIAGNOSIS — R531 Weakness: Secondary | ICD-10-CM | POA: Diagnosis not present

## 2014-09-22 DIAGNOSIS — M25561 Pain in right knee: Secondary | ICD-10-CM | POA: Diagnosis not present

## 2014-09-22 NOTE — Therapy (Signed)
[place], [place], [address] Phone: [phone]   Fax:  [phone]  Physical Therapy Treatment  Patient Details  Name: [name] MRN: [id number] Date of Birth: Jun 13, 1945 Referring Provider:  Mosie Lukes, MD  Encounter Date: 09/22/2014      PT End of Session - 09/22/14 0902    Visit Number 5   Number of Visits 8   Date for PT Re-Evaluation 10/05/14   PT Start Time 0733   PT Stop Time 0802   PT Time Calculation (min) 29 min   Activity Tolerance Patient tolerated treatment well;Patient limited by pain   Behavior During Therapy [facility] for tasks assessed/performed      Past Medical History  Diagnosis Date  . Obesity   . Esophageal reflux   . Fibromyalgia   . Asthma   . Allergy     rhinitis  . Sleep apnea, obstructive   . Bronchitis, acute 10/31/2010  . Otitis media of both ears 11/16/2010  . Depression with anxiety 01/08/2011  . Back pain 01/08/2011  . Sinusitis 06/11/2011  . Fatigue 06/11/2011  . Knee pain, right 09/20/2011  . Otitis media 09/20/2011  . Varicose veins of lower limb 10/02/2011  . Otitis media of left ear 09/19/2010  . Motion sickness 12/24/2011  . Sinusitis 12/24/2011  . Nausea & vomiting 12/24/2011  . Heart murmur   . Panic attacks   . Arthritis 02-01-12    osteoarthritis, Back pain, spine surgeries ? retain hardware cervial and  lumbar.  . Anemia 02-01-12    iron absorption problem  . Cancer 02-01-12    squamous cell -face  . Allergic state 09/21/2010  . Osteopenia 08/05/2014  . Hyperparathyroidism 08/18/2014  . Abdominal pain 08/18/2014    Past Surgical History  Procedure Laterality Date  . Bariatric surgery    . Lumbar disc surgery    . Bladder suspension      complicated by bowel nick/ clolostomy  . Gallbladder surgery  1978  . Appendectomy  1962  . Gastric bypass  2003    Patient also noted "gastric bypass resection - 2004"  . Cervical disc surgery      C-7 in  2004, C-4 and C-5 in 2010  . Colostomy bag      Confirm with patient. Listed under medical conditions on form dated 07/18/09.  [name]      Per medical history form dated 07/18/09.  [name] Hernia repair  2009    Two hernias and abdominal reconstruction  . Ivc filter      prophyllactically- no hx DVT  . Carpal tunnel release  02-01-12    right  . Colostomy reversal  02-01-12  . Abdominal surgery  02-01-12    ABDOMINAL SURGERY  . Vulva surgery  02-01-12    cyst removal-pt 8 months pregnant  . Total knee arthroplasty  02/11/2012    Procedure: TOTAL KNEE ARTHROPLASTY;  Surgeon: Mauri Pole, MD;  Location: [facility];  Service: Orthopedics;  Laterality: Right;    There were no vitals filed for this visit.  Visit Diagnosis:  Right knee pain  Generalized weakness  Activity intolerance  Stiffness of right knee      Subjective Assessment - 09/22/14 0735    Subjective Some exercises make my knee sore.  The ones where I lift my knee.  Tape did noe help much.   Currently in Pain? Yes   Pain Score 4    Pain Location Knee  nerve pain   Pain Orientation Right   Pain Descriptors / Indicators Aching  nerve pain   Pain Frequency Constant   Aggravating Factors  steps, bending   Pain Relieving Factors not sure                         [facility] Adult PT Treatment/Exercise - 09/22/14 0738    Knee/Hip Exercises: Standing   Heel Raises Left;1 set;10 reps   Other Standing Knee Exercises RT stand moving pillowcase with LT.  side movements irritated nerve pain radiating to foot. 3 way   Knee/Hip Exercises: Seated   Long Arc Quad 10 reps  5 LBS painful, 0 LBS 10 second hold eccentric   Hamstring Curl Strengthening;Right;1 set   Hamstring Limitations green band,  Radiating pain to toes so stopped    Sit to [employer] 5 reps;2 sets   Knee/Hip Exercises: Supine   Quad Sets 10 reps   Short Arc Quad Sets 10 reps  3 LBS.  arm pain to thumb rt                PT Education -  09/22/14 0901    Education provided Yes   Education Details what pain to work through and what pain to stop and try to lessen   Person(s) Educated Patient   Methods Explanation   Comprehension Verbalized understanding          PT Short Term Goals - 09/20/14 0753    PT SHORT TERM GOAL #1   Title I with initial HEP   Baseline has memorized   Time 2   Period Weeks   Status Achieved           PT Long Term Goals - 09/07/14 1605    PT LONG TERM GOAL #1   Title I with advanced HEP   Time 4   Period Weeks   Status New   PT LONG TERM GOAL #2   Title improved R hip strength to 4+/5 or better to improve funciton   Time 4   Period Weeks   Status New   PT LONG TERM GOAL #3   Title decreased pain with ambulation by 40%   Time 4   Period Weeks   Status New   PT LONG TERM GOAL #4   Title able to climb stairs with 25% less pain               Plan - 09/22/14 0903    Clinical Impression Statement Radiating pain RT arm, RT leg limited exercise today.  Does not tolerate latex so no patellar tracking tape today.  4-/5 strength RT quads.   PT Next Visit Plan Hip abduction to home.  MD note?   Consulted and Agree with Plan of Care Patient        Problem List Patient Active Problem List   Diagnosis Date Noted  . Hyperparathyroidism 08/18/2014  . Abdominal pain 08/18/2014  . Medicare annual wellness visit, subsequent 08/18/2014  . Osteopenia 08/05/2014  . Capsulitis of ankle 02/23/2014  . Leg length discrepancy 02/23/2014  . Plantar fasciitis of left foot 11/17/2013  . Pruritus 10/03/2013  . Narcolepsy without cataplexy 02/08/2013  . S/P right TKA 02/11/2012  . Motion sickness 12/24/2011  . Nausea & vomiting 12/24/2011  . Varicose veins of lower limb 10/02/2011  . Knee pain, right 09/20/2011  . Depression with anxiety 01/08/2011  . Back pain 01/08/2011  . Allergic rhinoconjunctivitis, seasonal and  perennial 09/21/2010  . SCC (squamous cell carcinoma), face  09/19/2010  . Otitis media of left ear 09/19/2010  . Chills 07/12/2010  . Menopause 07/12/2010  . Ventral hernia 04/06/2010  . COUGH 04/06/2010  . ABDOMINAL PAIN, GENERALIZED 04/06/2010  . ENCOPRESIS 03/21/2010  . DERMATITIS, ATOPIC 03/21/2010  . Diabetes mellitus type 2 in obese 03/07/2010  . DIABETIC  RETINOPATHY 03/07/2010  . OTHER HYPERPARATHYROIDISM 03/07/2010  . Vitamin D deficiency 03/07/2010  . Hyperlipidemia, mixed 03/07/2010  . Hypocalcemia 03/07/2010  . Iron deficiency anemia 03/07/2010  . TREMOR, ESSENTIAL 03/07/2010  . MITRAL REGURGITATION, 0 (MILD) 03/07/2010  . TRICUSPID REGURGITATION, MILD 03/07/2010  . ESSENTIAL HYPERTENSION, BENIGN 03/07/2010  . CARDIOMEGALY, MILD 03/07/2010  . DEGENERATIVE JOINT DISEASE, LEFT HIP 03/07/2010  . Disorder of bone and cartilage 03/07/2010  . Diarrhea 03/07/2010  . UNSPECIFIED URINARY INCONTINENCE 03/07/2010  . PERSONAL HX OF METHICILLIN RESIST STAPH AUREUS 03/07/2010  . DYSPNEA 11/24/2007  . Obesity 02/27/2007  . Obstructive sleep apnea 02/27/2007  . Seasonal and perennial allergic rhinitis 02/27/2007  . ESOPHAGEAL REFLUX 02/27/2007  . FIBROMYALGIA 02/27/2007    Austen Wygant 09/22/2014, 9:06 AM  [facility] [address] [place], [place], [address] Phone: [phone]   Fax:  [phone]     Melvenia Needles, PTA 09/22/2014 9:06 AM Phone: [phone] Fax: [phone]

## 2014-09-23 ENCOUNTER — Ambulatory Visit (INDEPENDENT_AMBULATORY_CARE_PROVIDER_SITE_OTHER): Payer: Medicare Other | Admitting: Internal Medicine

## 2014-09-23 ENCOUNTER — Encounter: Payer: Self-pay | Admitting: Internal Medicine

## 2014-09-23 ENCOUNTER — Ambulatory Visit: Payer: Medicare Other | Admitting: Internal Medicine

## 2014-09-23 VITALS — BP 124/62 | HR 60 | Temp 98.7°F | Resp 12 | Ht 59.5 in | Wt 206.8 lb

## 2014-09-23 DIAGNOSIS — E213 Hyperparathyroidism, unspecified: Secondary | ICD-10-CM | POA: Diagnosis not present

## 2014-09-23 DIAGNOSIS — E559 Vitamin D deficiency, unspecified: Secondary | ICD-10-CM

## 2014-09-23 MED ORDER — VITAMIN D (ERGOCALCIFEROL) 1.25 MG (50000 UNIT) PO CAPS: 50000.0000 [IU] | ORAL_CAPSULE | ORAL | Status: DC

## 2014-09-23 NOTE — Patient Instructions (Addendum)
Please restart Ergocalciferol 50,000 units once a week.  Start a Multivitamin (for men).  Return for labs in 2-2.5 months.  Please return in 6 months with your sugar log.   Dietary sources of calcium and vitamin D:  Calcium content (mg) - http://www.niams.MoviePins.co.za  Fortified oatmeal, 1 packet 350  Sardines, canned in oil, with edible bones, 3 oz. 324  Cheddar cheese, 1 oz. shredded 306  Milk, nonfat, 1 cup 302  Milkshake, 1 cup 300  Yogurt, plain, low-fat, 1 cup 300  Soybeans, cooked, 1 cup 261  Tofu, firm, with calcium,  cup 204  Orange juice, fortified with calcium, 6 oz. 200-260 (varies)  Salmon, canned, with edible bones, 3 oz. 181  Pudding, instant, made with 2% milk,  cup 153  Baked beans, 1 cup [place], 1% milk fat, 1 cup 138  Spaghetti, lasagna, 1 cup 125  Frozen yogurt, vanilla, soft-serve,  cup 103  Ready-to-eat cereal, fortified with calcium, 1 cup 100-1,000 (varies)  Cheese pizza, 1 slice 354  Fortified waffles, 2 100  Turnip greens, boiled,  cup 99  Broccoli, raw, 1 cup 90  Ice cream, vanilla,  cup 85  Soy or rice milk, fortified with calcium, 1 cup 80-500 (varies)   Vitamin D content (International Units, IU) - https://www.ars.usda.gov Cod liver oil, 1 tablespoon 1,360  Swordfish, cooked, 3 oz 566  Salmon (sockeye), cooked, 3 oz 447  Tuna fish, canned in water, drained, 3 oz 154  Orange juice fortified with vitamin D, 1 cup (check product labels, as amount of added vitamin D varies) 137  Milk, nonfat, reduced fat, and whole, vitamin D-fortified, 1 cup 115-124  Yogurt, fortified with 20% of the daily value for vitamin D, 6 oz 80  Margarine, fortified, 1 tablespoon 60  Sardines, canned in oil, drained, 2 sardines 46  Liver, beef, cooked, 3 oz 42  Egg, 1 large (vitamin D is found in yolk) 41  Ready-to-eat cereal, fortified with 10% of the daily value for vitamin D, 0.75-1 cup  40  Cheese, Swiss, 1 oz 6

## 2014-09-23 NOTE — Progress Notes (Signed)
Patient ID: [name], female   DOB: [birth year], 69 y.o.   MRN: [id number]   HPI  [name] is a 69 y.o.-year-old female, referred by her PCP, Dr. Charlett Wood, for evaluation for hyperparathyroidism with normo/hypo-calcemia.  Pt was dx with hyperparathyroidism in 07/2014. She has a h/o low-normal calcium level. I reviewed pt's pertinent labs: Lab Results  Component Value Date   PTH 108* 08/05/2014   CALCIUM 9.0 08/05/2014   CALCIUM 8.7 08/05/2014   CALCIUM 8.8 02/16/2014   CALCIUM 8.7 06/01/2013   CALCIUM 8.9 11/28/2012   CALCIUM 9.3 04/16/2012   CALCIUM 8.7 02/13/2012   CALCIUM 8.3* 02/12/2012   CALCIUM 9.5 02/01/2012   CALCIUM 8.5 12/07/2011   I reviewed pt's DEXA scans: Date L1-L3 T score FN T score 33% distal Radius  05/30/2012 -1.5 -2.3 (L)    No fractures or falls.   No h/o kidney stones.  No h/o CKD. Last BUN/Cr: Lab Results  Component Value Date   BUN 15 08/05/2014   CREATININE 0.70 08/05/2014   Pt is not on HCTZ.  She has a  h/o vitamin D deficiency. She was on Calcitriol "for years" >> stopped 1 year ago. Cannot tolerate MVI or calcium/iron >> nausea, stomach cramps.  Last vit D level was: Component     Latest Ref Rng 08/05/2014  VITD     30.00 - 100.00 ng/mL 9.76 (L)  She was started on Ergocalciferol 50,000 IU by PCP recently - x 12 weeks. She has nausea and AP, but took it. After this, she started 2000 units, stopped. Milk >> lactose intolerant.  Of note, she has a h/o GBP - biliopancreatic diversion, 2003. She had reintervention - small bowel extension. She has malabsorption >> low calcium, very low ferritin >> IV iron infusion.   Pt does not have a FH of hypercalcemia, kidney stones, or osteoporosis.   ROS: Constitutional: +  Fatigue, increased appetite, weight gain, feeling excessively cold, poor sleep  Eyes: + blurry vision, no xerophthalmia ENT: no sore throat, no nodules palpated in throat, no dysphagia/odynophagia, no  hoarseness Cardiovascular: no CP/SOB/palpitations/leg swelling Respiratory: no cough/SOB Gastrointestinal: no N/V/D/C, + heartburn Musculoskeletal: + muscle aches/no joint aches Skin: no rashes, + Easy bruising Neurological: + tremors/no numbness/tingling/dizziness, +  headache Psychiatric: + depression/+ anxiety  Past Medical History  Diagnosis Date  . Obesity   . Esophageal reflux   . Fibromyalgia   . Asthma   . Allergy     rhinitis  . Sleep apnea, obstructive   . Bronchitis, acute 10/31/2010  . Otitis media of both ears 11/16/2010  . Depression with anxiety 01/08/2011  . Back pain 01/08/2011  . Sinusitis 06/11/2011  . Fatigue 06/11/2011  . Knee pain, right 09/20/2011  . Otitis media 09/20/2011  . Varicose veins of lower limb 10/02/2011  . Otitis media of left ear 09/19/2010  . Motion sickness 12/24/2011  . Sinusitis 12/24/2011  . Nausea & vomiting 12/24/2011  . Heart murmur   . Panic attacks   . Arthritis 02-01-12    osteoarthritis, Back pain, spine surgeries ? retain hardware cervial and  lumbar.  . Anemia 02-01-12    iron absorption problem  . Cancer 02-01-12    squamous cell -face  . Allergic state 09/21/2010  . Osteopenia 08/05/2014  . Hyperparathyroidism 08/18/2014  . Abdominal pain 08/18/2014   Past Surgical History  Procedure Laterality Date  . Bariatric surgery    . Lumbar disc surgery    . Bladder suspension  complicated by bowel nick/ clolostomy  . Gallbladder surgery  1978  . Appendectomy  1962  . Gastric bypass  2003    Patient also noted "gastric bypass resection - 2004"  . Cervical disc surgery      C-7 in 2004, C-4 and C-5 in 2010  . Colostomy bag      Confirm with patient. Listed under medical conditions on form dated 07/18/09.  [name]      Per medical history form dated 07/18/09.  [name] Hernia repair  2009    Two hernias and abdominal reconstruction  . Ivc filter      prophyllactically- no hx DVT  . Carpal tunnel release  02-01-12    right  .  Colostomy reversal  02-01-12  . Abdominal surgery  02-01-12    ABDOMINAL SURGERY  . Vulva surgery  02-01-12    cyst removal-pt 8 months pregnant  . Total knee arthroplasty  02/11/2012    Procedure: TOTAL KNEE ARTHROPLASTY;  Surgeon: Gail Pole, MD;  Location: [facility];  Service: Orthopedics;  Laterality: Right;   Social History   Social History  . Marital Status: Married    Spouse Name: N/A  . Number of Children: 3   Occupational History  .  Development worker, [employer]   Social History Main Topics  . Smoking status: Never Smoker   . Smokeless tobacco: Never Used  . Alcohol Use: No     Comment: special occasions  . Drug Use: No   Current Outpatient Prescriptions on File Prior to Visit  Medication Sig Dispense Refill  . ALPRAZolam (XANAX) 0.25 MG tablet Take 0.25 mg by mouth daily as needed. For anxiety    . aspirin 81 MG tablet Take 81 mg by mouth daily.    [name] atorvastatin (LIPITOR) 10 MG tablet Take 10 mg by mouth daily.    . bifidobacterium infantis (ALIGN) capsule Take 1 capsule by mouth daily.     . DULoxetine (CYMBALTA) 60 MG capsule Take 60 mg by mouth every morning.     . DULoxetine (CYMBALTA) 60 MG capsule TAKE ONE CAPSULE BY MOUTH ONCE DAILY 90 capsule 0  . methylphenidate (RITALIN) 10 MG tablet Take 10 mg by mouth 2 (two) times daily.    . montelukast (SINGULAIR) 10 MG tablet Take 1 tablet (10 mg total) by mouth at bedtime. 90 tablet 3  . NON FORMULARY once a week.     . NON FORMULARY CPAP- set on 12 [employer].    [name] omeprazole (PRILOSEC OTC) 20 MG tablet Take 20 mg by mouth daily.    . ranitidine (ZANTAC) 150 MG tablet 1 tablet qam    . traMADol (ULTRAM) 50 MG tablet Take 1 tablet (50 mg total) by mouth at bedtime as needed. 30 tablet 0  . traZODone (DESYREL) 100 MG tablet 1/2 - 1 tab for sleep as directed 90 tablet 1  . VESICARE 10 MG tablet TAKE ONE TABLET BY MOUTH ONCE DAILY 30 tablet 4   No current facility-administered medications on file prior to visit.    Allergies  Allergen Reactions  . Hydrocodone      nausea and vomiting and headaches.  . Advil [Ibuprofen]     itching  . Cephalexin      Neuropathy  . Codeine     Crazy in the head, bp drops  . Levofloxacin Itching  . Meperidine Hcl     B/P drops  . Morphine And Related      States" extreme migraines"  .  Oxycodone-Acetaminophen Other (See Comments)    Crazy in head, drop in bp  . Sulfonamide Derivatives Nausea And Vomiting    Stomach upset  . Adhesive [Tape] Rash    Latex in adhesive tape. Please use paper tape  . Penicillins Nausea And Vomiting and Rash   Family History  Problem Relation Age of Onset  . Alcohol abuse Mother   . Other Mother     accidental med overdose  . Emphysema Mother     smoked  . Bipolar disorder Mother   . Other Father     CHF  . Diabetes Father   . Cancer Father     throat ca/ smoked  . Alcohol abuse Father   . Stroke Father   . Hypertension Brother   . Hyperlipidemia Brother   . Obesity Brother   . Heart attack Maternal Grandfather   . Heart attack Paternal Grandmother   . Heart attack Paternal Grandfather   . Arthritis Son     psoriatic  . Arthritis Son     psoriatic  . Obesity Son    PE: BP 124/62 mmHg  Pulse 60  Temp(Src) 98.7 F (37.1 C) (Oral)  Resp 12  Ht 4' 11.5" (1.511 m)  Wt 206 lb 12.8 oz (93.804 kg)  BMI 41.09 kg/m2  SpO2 95% Wt Readings from Last 3 Encounters:  09/23/14 206 lb 12.8 oz (93.804 kg)  08/30/14 205 lb (92.987 kg)  08/09/14 208 lb (94.348 kg)   Constitutional: obese, in NAD.  Pale. Eyes: PERRLA, EOMI, no exophthalmos ENT: moist mucous membranes, no thyromegaly, no cervical lymphadenopathy Cardiovascular: RRR, + 2/6 SEM, no RG Respiratory: CTA B Gastrointestinal: abdomen soft, NT, ND, BS+ Musculoskeletal: no deformities, strength intact in all 4 Skin: moist, warm, no rashes Neurological: no tremor with outstretched hands, DTR normal in all 4  Assessment: 1.  Hypo/normo-calcemia/hyperparathyroidism  2.  Status post gastric bypass  Plan: 1. Patient has had several instances of low calcium in the past, but had normocalcemia more recently.  In  07/2014, she was found to have a high parathyroid hormone obtained in the setting of a normal calcium level. A vitamin D level obtained at that time was very low, at 9.7. Of note, patient has a significant  PMH of biliary diversion gastric bypass with subsequent malabsorption. She cannot take multivitamins because of abdominal pain and nausea. She was on IV iron in the past, not recently. She cannot tolerate po iron, she tells me.  I believe that her history of gastric bypass and subsequently vitamin D deficiency is the cause for her elevated PTH and history of hypocalcemia. - I discussed with the patient about the physiology of calcium -  Vitamin D - parathyroid hormone and the fact that the PTH being elevated in the setting of low vitamin D means that her parathyroid glands are functioning  appropriately. However, we need to find a way to raise her vitamin D so that her PTH will  normalize.  I suggested that she starts ergocalciferol 50,000 units once a week. She will probably need to stay on this for life. I would have preferred to start this twice a week, but she has some GI discomfort when she takes it. However, she understands the importance of raising her vitamin D to maintain the health of her bones and her well-being , and we'll start  Taking the weekly supplement. We will then recheck the following labs in 2-2.5 months after she starts the high-dose vitamin D : Orders  Placed This Encounter  Procedures  . Vitamin D (25 hydroxy)  . Vitamin D 1,25 dihydroxy  . PTH, Intact and Calcium  -  If the PTH remains high after vitamin D normalizes, will need further investigation  2.  Status post gastric bypass -  This was complicated with malabsorption of multiple nutrients - I strongly advised the patient to start a  multivitamin, recommended a men's multivitamin since she cannot tolerate iron -  We will also start ergocalciferol once a week -  With the purpose to stay on this dose from now on Return in about 6 months (around 03/23/2015).  - time spent with the patient: 1 hour, of which >50% was spent in obtaining information about her symptoms, reviewing her previous labs, evaluations, and treatments, counseling her about her condition (please see the discussed topics above), and developing a plan to further investigate it; she had a number of questions which I addressed.

## 2014-09-28 ENCOUNTER — Encounter: Payer: Medicare Other | Admitting: Physical Therapy

## 2014-09-28 ENCOUNTER — Ambulatory Visit: Payer: Medicare Other | Attending: Family Medicine | Admitting: Physical Therapy

## 2014-09-28 DIAGNOSIS — M25661 Stiffness of right knee, not elsewhere classified: Secondary | ICD-10-CM | POA: Diagnosis not present

## 2014-09-28 DIAGNOSIS — R6889 Other general symptoms and signs: Secondary | ICD-10-CM | POA: Diagnosis not present

## 2014-09-28 DIAGNOSIS — M25561 Pain in right knee: Secondary | ICD-10-CM | POA: Insufficient documentation

## 2014-09-28 DIAGNOSIS — R531 Weakness: Secondary | ICD-10-CM | POA: Diagnosis not present

## 2014-09-28 NOTE — Therapy (Signed)
[place], [place], [address] Phone: [phone]   Fax:  [phone]  Physical Therapy Treatment  Patient Details  Name: [name] MRN: [id number] Date of Birth: January 30, 1945 Referring Provider:  Mosie Lukes, MD  Encounter Date: 09/28/2014      PT End of Session - 09/28/14 1009    Visit Number 6   Number of Visits 8   Date for PT Re-Evaluation 10/05/14   PT Start Time 0731   PT Stop Time 0800   PT Time Calculation (min) 29 min   Activity Tolerance Patient tolerated treatment well;No increased pain   Behavior During Therapy [facility] for tasks assessed/performed      Past Medical History  Diagnosis Date  . Obesity   . Esophageal reflux   . Fibromyalgia   . Asthma   . Allergy     rhinitis  . Sleep apnea, obstructive   . Bronchitis, acute 10/31/2010  . Otitis media of both ears 11/16/2010  . Depression with anxiety 01/08/2011  . Back pain 01/08/2011  . Sinusitis 06/11/2011  . Fatigue 06/11/2011  . Knee pain, right 09/20/2011  . Otitis media 09/20/2011  . Varicose veins of lower limb 10/02/2011  . Otitis media of left ear 09/19/2010  . Motion sickness 12/24/2011  . Sinusitis 12/24/2011  . Nausea & vomiting 12/24/2011  . Heart murmur   . Panic attacks   . Arthritis 02-01-12    osteoarthritis, Back pain, spine surgeries ? retain hardware cervial and  lumbar.  . Anemia 02-01-12    iron absorption problem  . Cancer 02-01-12    squamous cell -face  . Allergic state 09/21/2010  . Osteopenia 08/05/2014  . Hyperparathyroidism 08/18/2014  . Abdominal pain 08/18/2014    Past Surgical History  Procedure Laterality Date  . Bariatric surgery    . Lumbar disc surgery    . Bladder suspension      complicated by bowel nick/ clolostomy  . Gallbladder surgery  1978  . Appendectomy  1962  . Gastric bypass  2003    Patient also noted "gastric bypass resection - 2004"  . Cervical disc surgery      C-7 in 2004,  C-4 and C-5 in 2010  . Colostomy bag      Confirm with patient. Listed under medical conditions on form dated 07/18/09.  [name]      Per medical history form dated 07/18/09.  [name] Hernia repair  2009    Two hernias and abdominal reconstruction  . Ivc filter      prophyllactically- no hx DVT  . Carpal tunnel release  02-01-12    right  . Colostomy reversal  02-01-12  . Abdominal surgery  02-01-12    ABDOMINAL SURGERY  . Vulva surgery  02-01-12    cyst removal-pt 8 months pregnant  . Total knee arthroplasty  02/11/2012    Procedure: TOTAL KNEE ARTHROPLASTY;  Surgeon: Mauri Pole, MD;  Location: [facility];  Service: Orthopedics;  Laterality: Right;    There were no vitals filed for this visit.  Visit Diagnosis:  Right knee pain  Generalized weakness  Activity intolerance  Stiffness of right knee      Subjective Assessment - 09/28/14 0751    Subjective Walked 20 minutes over the weekend.  Hurt when she was walking and then her back pain kicked in.  She did not answer question about pain number saying it is not a sharp.  just achey.  Both knees.  Felt her RT knee was really weak (lasted a few minutes.  It was not associated with back pain, she was just walking up the sidewalk.She stopped and has to hold onto her husband ) yesterday she was oustde pulling weeds.  Bending over to pull.    She knows she should not be pulling weeds that way.                           [place] Adult PT Treatment/Exercise - 09/28/14 0746    Knee/Hip Exercises: Aerobic   Nustep L5, 5 minutes   Knee/Hip Exercises: Seated   Sit to Sand 20 reps   Knee/Hip Exercises: Supine   Quad Sets 10 reps;2 sets   Heel Prop for Knee Extension Limitations --   Straight Leg Raises 1 set;15 reps   Straight Leg Raises Limitations Patient asked to do 20 reps, unable.  MMT 4/5 Both   Knee/Hip Exercises: Sidelying   Hip ABduction AROM;Both;1 set;15 reps   Hip ABduction Limitations 4/5 MMT   Hip ADduction 15  reps   Hip ADduction Limitations MMT 3/5 Adduction RT. Lt  LT 4/5 Adduction                PT Education - 09/28/14 1007    Education provided Yes   Education Details Again needed instruction about activity and pain.  Alternate suggestions for recumbant bike she has at home VS walking for now, Pool walking OK.   Person(s) Educated Patient   Methods Explanation   Comprehension Verbalized understanding          PT Short Term Goals - 09/28/14 1013    PT SHORT TERM GOAL #1   Title I with initial HEP   Time 2   Period Weeks   Status Achieved           PT Long Term Goals - 09/28/14 1013    PT LONG TERM GOAL #1   Title I with advanced HEP   Time 4   Period Weeks   Status On-going   PT LONG TERM GOAL #2   Title improved R hip strength to 4+/5 or better to improve funciton   Baseline 4/5   PT LONG TERM GOAL #3   Title decreased pain with ambulation by 40%   Baseline Not yet improved.  Walked 20 minutes over weekend and was up most of the night with pain.   Time 4   Period Weeks   Status On-going   PT LONG TERM GOAL #4   Title able to climb stairs with 25% less pain   Baseline not yet with less pain   Period Weeks   Status On-going               Plan - 09/28/14 1009    Clinical Impression Statement Patient declined need of modalities after today's session.  She has been trying to walk for exercise and has been suffering all night.  She is making choices for exercise that flare her leg and back pain.  [place]  and stair pain have not yet improved with PT.  Strength Hips Bpoth 4/5 except for  RT adductor 3/5.        Problem List Patient Active Problem List   Diagnosis Date Noted  . Hyperparathyroidism 08/18/2014  . Abdominal pain 08/18/2014  . Medicare annual wellness visit, subsequent 08/18/2014  . Osteopenia 08/05/2014  . Capsulitis of ankle 02/23/2014  . Leg  length discrepancy 02/23/2014  . Plantar fasciitis of left foot 11/17/2013  . Pruritus  10/03/2013  . Narcolepsy without cataplexy 02/08/2013  . S/P right TKA 02/11/2012  . Motion sickness 12/24/2011  . Nausea & vomiting 12/24/2011  . Varicose veins of lower limb 10/02/2011  . Knee pain, right 09/20/2011  . Depression with anxiety 01/08/2011  . Back pain 01/08/2011  . Allergic rhinoconjunctivitis, seasonal and perennial 09/21/2010  . SCC (squamous cell carcinoma), face 09/19/2010  . Otitis media of left ear 09/19/2010  . Chills 07/12/2010  . Menopause 07/12/2010  . Ventral hernia 04/06/2010  . COUGH 04/06/2010  . ABDOMINAL PAIN, GENERALIZED 04/06/2010  . ENCOPRESIS 03/21/2010  . DERMATITIS, ATOPIC 03/21/2010  . Diabetes mellitus type 2 in obese 03/07/2010  . DIABETIC  RETINOPATHY 03/07/2010  . OTHER HYPERPARATHYROIDISM 03/07/2010  . Vitamin D deficiency 03/07/2010  . Hyperlipidemia, mixed 03/07/2010  . Hypocalcemia 03/07/2010  . Iron deficiency anemia 03/07/2010  . TREMOR, ESSENTIAL 03/07/2010  . MITRAL REGURGITATION, 0 (MILD) 03/07/2010  . TRICUSPID REGURGITATION, MILD 03/07/2010  . ESSENTIAL HYPERTENSION, BENIGN 03/07/2010  . CARDIOMEGALY, MILD 03/07/2010  . DEGENERATIVE JOINT DISEASE, LEFT HIP 03/07/2010  . Disorder of bone and cartilage 03/07/2010  . Diarrhea 03/07/2010  . UNSPECIFIED URINARY INCONTINENCE 03/07/2010  . PERSONAL HX OF METHICILLIN RESIST STAPH AUREUS 03/07/2010  . DYSPNEA 11/24/2007  . Obesity 02/27/2007  . Obstructive sleep apnea 02/27/2007  . Seasonal and perennial allergic rhinitis 02/27/2007  . ESOPHAGEAL REFLUX 02/27/2007  . FIBROMYALGIA 02/27/2007    Batoul Limes 09/28/2014, 10:19 AM  [facility] [address] [place], [place], [address] Phone: [phone]   Fax:  [phone]     Melvenia Needles, PTA 09/28/2014 10:19 AM Phone: [phone] Fax: [phone]

## 2014-09-30 ENCOUNTER — Other Ambulatory Visit (INDEPENDENT_AMBULATORY_CARE_PROVIDER_SITE_OTHER): Payer: Medicare Other

## 2014-09-30 ENCOUNTER — Encounter: Payer: Self-pay | Admitting: Family Medicine

## 2014-09-30 ENCOUNTER — Ambulatory Visit (INDEPENDENT_AMBULATORY_CARE_PROVIDER_SITE_OTHER): Payer: Medicare Other | Admitting: Family Medicine

## 2014-09-30 VITALS — BP 118/62 | HR 77 | Ht 59.5 in | Wt 207.0 lb

## 2014-09-30 DIAGNOSIS — M7050 Other bursitis of knee, unspecified knee: Secondary | ICD-10-CM

## 2014-09-30 DIAGNOSIS — M7150 Other bursitis, not elsewhere classified, unspecified site: Secondary | ICD-10-CM | POA: Diagnosis not present

## 2014-09-30 DIAGNOSIS — M25561 Pain in right knee: Secondary | ICD-10-CM | POA: Diagnosis not present

## 2014-09-30 NOTE — Assessment & Plan Note (Signed)
Patient was given an injection today and tolerated the procedure well. The patient continues to have pain fibromyalgia is likely the primary diagnosis. I do think that she did well and tolerated the procedure. Patient given new exercises and work with Product/process development scientist. We discussed icing regimen as well as a possible compression sleeve that could be beneficial. Patient come back again in 3 weeks. If continued have pain out consider changing patient's Cymbalta to Effexor and we will discuss at follow-up.

## 2014-09-30 NOTE — Progress Notes (Signed)
[name] [address], [place] [address] Phone: [phone] Subjective:     CC: Right knee pain follow-up  SLH:[id number] KEMONIE [name] is a 69 y.o. female coming in with complaint of right knee pain. Patient has had a past medical history significant for moderate arthritis of this right knee. Patient's x-rays in 2013 were reviewed by me and does justify this diagnosis. Patient states soon after 2013 patient did have a knee replacement. Patient continued to have pain and it appears the patient had more of a pes anserine bursitis. Patient elected try conservative therapy as well as formal physical therapy. Patient states she is not noticing significant improvement. States that going from a seated to stated position after sitting a long amount of time seems to be the most severe. Patient states going up and down stairs can be difficult going downstairs seems to be more. Denies any instability. Patient feels that she has gained some strength in the leg but still continues to have the pain mostly on the anterior medial aspect of the knee.  Past medical history, social, surgical and family history all reviewed in electronic medical record.   Review of Systems: No headache, visual changes, nausea, vomiting, diarrhea, constipation, dizziness, abdominal pain, skin rash, fevers, chills, night sweats, weight loss, swollen lymph nodes, body aches, joint swelling, muscle aches, chest pain, shortness of breath, mood changes.   Objective Blood pressure 118/62, pulse 77, height 4' 11.5" (1.511 m), weight 207 lb (93.895 kg), SpO2 94 %.  General: No apparent distress alert and oriented x3 mood and affect normal, dressed appropriately.  HEENT: Pupils equal, extraocular movements intact  Respiratory: Patient's speak in full sentences and does not appear short of breath  Cardiovascular: No lower extremity edema, non tender, no erythema  Skin: Warm dry intact with no signs  of infection or rash on extremities or on axial skeleton.  Abdomen: Soft nontender  Neuro: Cranial nerves II through XII are intact, neurovascularly intact in all extremities with 2+ DTRs and 2+ pulses.  Lymph: No lymphadenopathy of posterior or anterior cervical chain or axillae bilaterally.  Gait normal with good balance and coordination.  MSK:  Non tender with full range of motion and good stability and symmetric strength and tone of shoulders, elbows, wrist, hip, and ankles bilaterally. Mild osteophytic changes of multiple joints  Knee: Right Incision from patient's previous surgery well-healed.. Range of motion lacks last 2 of extension and has flexion to 98 degrees which is a mild improvement Ligaments with solid consistent endpoints including ACL, PCL, LCL, MCL. Continued severe tenderness over the pes anserine area Non painful patellar compression. Patellar glide without crepitus. Patellar and quadriceps tendons unremarkable. Hamstring and quadriceps strength is normal.  Contralateral knee unremarkable  Procedure: Real-time Ultrasound Guided Injection of pes anserine area and distal tendon hamstring sheath injection Device: GE Logiq E  Ultrasound guided injection is preferred based studies that show increased duration, increased effect, greater accuracy, decreased procedural pain, increased response rate, and decreased cost with ultrasound guided versus blind injection.  Verbal informed consent obtained.  Time-out conducted.  Noted no overlying erythema, induration, or other signs of local infection.  Skin prepped in a sterile fashion.  Local anesthesia: Topical Ethyl chloride.  With sterile technique and under real time ultrasound guidance:  With a 25-gauge 1 inch needle patient was injected with a total of 0.5 mL of 0.5% Marcaine and 0.5 mL of Kenalog 41 g/dL. Completed without difficulty  Pain immediately  resolved suggesting accurate placement of the medication.  Advised to  call if fevers/chills, erythema, induration, drainage, or persistent bleeding.  Images permanently stored and available for review in the ultrasound unit.  Impression: Technically successful ultrasound guided injection.     procedure note 56812; 15 minutes spent for Therapeutic exercises as stated in above notes.  This included exercises focusing on stretching, strengthening, with significant focus on eccentric aspects.   Patient given hamstring stretching exercises as well as eccentric's including  Nordic and modified askling. Proper technique shown and discussed handout in great detail with ATC.  All questions were discussed and answered.       Impression and Recommendations:     This case required medical decision making of moderate complexity.

## 2014-09-30 NOTE — Progress Notes (Signed)
Pre visit review using our clinic review tool, if applicable. No additional management support is needed unless otherwise documented below in the visit note. 

## 2014-09-30 NOTE — Patient Instructions (Addendum)
[name] Ice when you need it especially at end of day New exercises 3 times a week Thigh compression sleeve at [place] or [employer] daily We will consider changing to effexor from cymbalta at next follow up if needed See me again in 3 weeks.

## 2014-10-01 ENCOUNTER — Ambulatory Visit: Payer: Medicare Other | Admitting: Physical Therapy

## 2014-10-01 DIAGNOSIS — R531 Weakness: Secondary | ICD-10-CM | POA: Diagnosis not present

## 2014-10-01 DIAGNOSIS — M25661 Stiffness of right knee, not elsewhere classified: Secondary | ICD-10-CM

## 2014-10-01 DIAGNOSIS — R6889 Other general symptoms and signs: Secondary | ICD-10-CM

## 2014-10-01 DIAGNOSIS — M25561 Pain in right knee: Secondary | ICD-10-CM | POA: Diagnosis not present

## 2014-10-01 NOTE — Therapy (Signed)
[place], [place], [address] Phone: [phone]   Fax:  [phone]  Physical Therapy Treatment  Patient Details  Name: [name] MRN: [id number] Date of Birth: 1945-02-02 Referring Provider:  Mosie Lukes, MD  Encounter Date: 10/01/2014      PT End of Session - 10/01/14 0803    Visit Number 7   Number of Visits 8   Date for PT Re-Evaluation 10/05/14   PT Start Time 0704   PT Stop Time 0759   PT Time Calculation (min) 55 min   Activity Tolerance Patient tolerated treatment well      Past Medical History  Diagnosis Date  . Obesity   . Esophageal reflux   . Fibromyalgia   . Asthma   . Allergy     rhinitis  . Sleep apnea, obstructive   . Bronchitis, acute 10/31/2010  . Otitis media of both ears 11/16/2010  . Depression with anxiety 01/08/2011  . Back pain 01/08/2011  . Sinusitis 06/11/2011  . Fatigue 06/11/2011  . Knee pain, right 09/20/2011  . Otitis media 09/20/2011  . Varicose veins of lower limb 10/02/2011  . Otitis media of left ear 09/19/2010  . Motion sickness 12/24/2011  . Sinusitis 12/24/2011  . Nausea & vomiting 12/24/2011  . Heart murmur   . Panic attacks   . Arthritis 02-01-12    osteoarthritis, Back pain, spine surgeries ? retain hardware cervial and  lumbar.  . Anemia 02-01-12    iron absorption problem  . Cancer 02-01-12    squamous cell -face  . Allergic state 09/21/2010  . Osteopenia 08/05/2014  . Hyperparathyroidism 08/18/2014  . Abdominal pain 08/18/2014    Past Surgical History  Procedure Laterality Date  . Bariatric surgery    . Lumbar disc surgery    . Bladder suspension      complicated by bowel nick/ clolostomy  . Gallbladder surgery  1978  . Appendectomy  1962  . Gastric bypass  2003    Patient also noted "gastric bypass resection - 2004"  . Cervical disc surgery      C-7 in 2004, C-4 and C-5 in 2010  . Colostomy bag      Confirm with patient. Listed under  medical conditions on form dated 07/18/09.  [name]      Per medical history form dated 07/18/09.  [name] Hernia repair  2009    Two hernias and abdominal reconstruction  . Ivc filter      prophyllactically- no hx DVT  . Carpal tunnel release  02-01-12    right  . Colostomy reversal  02-01-12  . Abdominal surgery  02-01-12    ABDOMINAL SURGERY  . Vulva surgery  02-01-12    cyst removal-pt 8 months pregnant  . Total knee arthroplasty  02/11/2012    Procedure: TOTAL KNEE ARTHROPLASTY;  Surgeon: Mauri Pole, MD;  Location: [facility];  Service: Orthopedics;  Laterality: Right;    There were no vitals filed for this visit.  Visit Diagnosis:  Right knee pain  Generalized weakness  Activity intolerance  Stiffness of right knee      Subjective Assessment - 10/01/14 0707    Subjective Saw the doctor yesterday and got an injection in the bursa.  Dr. Tamala Julian thinks it is partly this and partly nerve damage from the surgery.   Hurts with prolonged walking and with rising from sit to stand.     Pertinent History ventral hernia, RT TKR  02/13/12, two cervical and 3-4 lumbar disc surgeries   Currently in Pain? Yes   Pain Location Knee   Pain Orientation Right   Pain Descriptors / Indicators Aching   Aggravating Factors  walking, sit to stand, getting down on knees to play with grandchild.                           [facility] Adult PT Treatment/Exercise - 10/01/14 0711    Knee/Hip Exercises: Aerobic   Nustep L5 6 min UEs/LEs   Knee/Hip Exercises: Standing   Lateral Step Up Right;Left;1 set;10 reps;Hand Hold: 2;Step Height: 4"   Other Standing Knee Exercises R/ L hip extension isometrics against wall 5x 5 sec hold   Other Standing Knee Exercises hip abd isometrics 5x R/L 5 sec holds   Knee/Hip Exercises: Seated   Long Arc Quad Strengthening;Right;Left;1 set;10 reps  red band (low lift)   Clamshell with TheraBand Red   Knee/Hip Flexion red band around feet 10x right left with  ankle DF   Hamstring Curl Strengthening;Right;Left;1 set;10 reps  red band   Knee/Hip Exercises: Supine   Other Supine Knee/Hip Exercises ab brace with bent knee raise 10x B   Knee/Hip Exercises: Sidelying   Clams 15 red band right   Electrical Stimulation   Electrical Stimulation Location right knee   Electrical Stimulation Action IFC   Electrical Stimulation Parameters 16 ma   Electrical Stimulation Goals Pain                PT Education - 10/01/14 0753    Education provided Yes   Education Details seated red band;  request sent to Dr. Tamala Julian for TENS   Person(s) Educated Patient   Methods Explanation          PT Short Term Goals - 10/01/14 0804    PT SHORT TERM GOAL #1   Title I with initial HEP   Status Achieved           PT Long Term Goals - 10/01/14 0804    PT LONG TERM GOAL #1   Title I with advanced HEP   Time 4   Period Weeks   Status On-going   PT LONG TERM GOAL #2   Title improved R hip strength to 4+/5 or better to improve funciton   Time 4   Period Weeks   Status On-going   PT LONG TERM GOAL #3   Title decreased pain with ambulation by 40%   Time 4   Period Weeks   Status On-going   PT LONG TERM GOAL #4   Title able to climb stairs with 25% less pain   Time 4   Period Weeks   Status On-going               Plan - 10/01/14 0758    Clinical Impression Statement The patient reports primarily soreness today vs usual achiness (which she attributes to yesterday's injection).  Able to perform supine, seated and low level standing exercises with fatigue but no exacerbation of pain.  Reports good pain reduction and improved sensation with IFC.  She feels this would be really helpful to have while she is working.  Order request faxed to Dr. Tamala Julian.     PT Next Visit Plan Do recertification next visit; check progress toward goals;  instruct and dispense TENS if order received        Problem List Patient Active Problem List   Diagnosis  Date Noted  . Pes anserine bursitis 09/30/2014  . Hyperparathyroidism 08/18/2014  . Abdominal pain 08/18/2014  . Medicare annual wellness visit, subsequent 08/18/2014  . Osteopenia 08/05/2014  . Capsulitis of ankle 02/23/2014  . Leg length discrepancy 02/23/2014  . Plantar fasciitis of left foot 11/17/2013  . Pruritus 10/03/2013  . Narcolepsy without cataplexy 02/08/2013  . S/P right TKA 02/11/2012  . Motion sickness 12/24/2011  . Nausea & vomiting 12/24/2011  . Varicose veins of lower limb 10/02/2011  . Knee pain, right 09/20/2011  . Depression with anxiety 01/08/2011  . Back pain 01/08/2011  . Allergic rhinoconjunctivitis, seasonal and perennial 09/21/2010  . SCC (squamous cell carcinoma), face 09/19/2010  . Otitis media of left ear 09/19/2010  . Chills 07/12/2010  . Menopause 07/12/2010  . Ventral hernia 04/06/2010  . COUGH 04/06/2010  . ABDOMINAL PAIN, GENERALIZED 04/06/2010  . ENCOPRESIS 03/21/2010  . DERMATITIS, ATOPIC 03/21/2010  . Diabetes mellitus type 2 in obese 03/07/2010  . DIABETIC  RETINOPATHY 03/07/2010  . OTHER HYPERPARATHYROIDISM 03/07/2010  . Vitamin D deficiency 03/07/2010  . Hyperlipidemia, mixed 03/07/2010  . Hypocalcemia 03/07/2010  . Iron deficiency anemia 03/07/2010  . TREMOR, ESSENTIAL 03/07/2010  . MITRAL REGURGITATION, 0 (MILD) 03/07/2010  . TRICUSPID REGURGITATION, MILD 03/07/2010  . ESSENTIAL HYPERTENSION, BENIGN 03/07/2010  . CARDIOMEGALY, MILD 03/07/2010  . DEGENERATIVE JOINT DISEASE, LEFT HIP 03/07/2010  . Disorder of bone and cartilage 03/07/2010  . Diarrhea 03/07/2010  . UNSPECIFIED URINARY INCONTINENCE 03/07/2010  . PERSONAL HX OF METHICILLIN RESIST STAPH AUREUS 03/07/2010  . DYSPNEA 11/24/2007  . Obesity 02/27/2007  . Obstructive sleep apnea 02/27/2007  . Seasonal and perennial allergic rhinitis 02/27/2007  . ESOPHAGEAL REFLUX 02/27/2007  . FIBROMYALGIA 02/27/2007    Alvera Singh 10/01/2014, 8:05 AM  [facility] [address] [place], [place], [address] Phone: [phone]   Fax:  [phone]  Ruben Im, PT 10/01/2014 8:06 AM Phone: [phone] Fax: [phone]

## 2014-10-01 NOTE — Patient Instructions (Signed)
Seated red band exercises:  Knee extension press 10x 5-7x/week; hip/knee lift 10x 5-7x/week; seated clams

## 2014-10-05 ENCOUNTER — Ambulatory Visit: Payer: Medicare Other | Admitting: Physical Therapy

## 2014-10-05 DIAGNOSIS — R6889 Other general symptoms and signs: Secondary | ICD-10-CM | POA: Diagnosis not present

## 2014-10-05 DIAGNOSIS — M25661 Stiffness of right knee, not elsewhere classified: Secondary | ICD-10-CM | POA: Diagnosis not present

## 2014-10-05 DIAGNOSIS — R531 Weakness: Secondary | ICD-10-CM

## 2014-10-05 DIAGNOSIS — M25561 Pain in right knee: Secondary | ICD-10-CM | POA: Diagnosis not present

## 2014-10-05 NOTE — Therapy (Signed)
[place], [place], [address] Phone: [phone]   Fax:  [phone]  Physical Therapy Treatment/Recertification  Patient Details  Name: [name] MRN: [id number] Date of Birth: 25-Jan-1945 Referring Provider:  Mosie Lukes, MD  Encounter Date: 10/05/2014      PT End of Session - 10/05/14 1637    Visit Number 8   Number of Visits 16   Date for PT Re-Evaluation 11/02/14   Authorization Type Medicare; G code at visit 10 and progress note, FOTO done at visit 8; KX at visit 15   PT Start Time 1625   PT Stop Time 1730   PT Time Calculation (min) 65 min   Activity Tolerance Patient tolerated treatment well      Past Medical History  Diagnosis Date  . Obesity   . Esophageal reflux   . Fibromyalgia   . Asthma   . Allergy     rhinitis  . Sleep apnea, obstructive   . Bronchitis, acute 10/31/2010  . Otitis media of both ears 11/16/2010  . Depression with anxiety 01/08/2011  . Back pain 01/08/2011  . Sinusitis 06/11/2011  . Fatigue 06/11/2011  . Knee pain, right 09/20/2011  . Otitis media 09/20/2011  . Varicose veins of lower limb 10/02/2011  . Otitis media of left ear 09/19/2010  . Motion sickness 12/24/2011  . Sinusitis 12/24/2011  . Nausea & vomiting 12/24/2011  . Heart murmur   . Panic attacks   . Arthritis 02-01-12    osteoarthritis, Back pain, spine surgeries ? retain hardware cervial and  lumbar.  . Anemia 02-01-12    iron absorption problem  . Cancer 02-01-12    squamous cell -face  . Allergic state 09/21/2010  . Osteopenia 08/05/2014  . Hyperparathyroidism 08/18/2014  . Abdominal pain 08/18/2014    Past Surgical History  Procedure Laterality Date  . Bariatric surgery    . Lumbar disc surgery    . Bladder suspension      complicated by bowel nick/ clolostomy  . Gallbladder surgery  1978  . Appendectomy  1962  . Gastric bypass  2003    Patient also noted "gastric bypass resection - 2004"   . Cervical disc surgery      C-7 in 2004, C-4 and C-5 in 2010  . Colostomy bag      Confirm with patient. Listed under medical conditions on form dated 07/18/09.  [name]      Per medical history form dated 07/18/09.  [name] Hernia repair  2009    Two hernias and abdominal reconstruction  . Ivc filter      prophyllactically- no hx DVT  . Carpal tunnel release  02-01-12    right  . Colostomy reversal  02-01-12  . Abdominal surgery  02-01-12    ABDOMINAL SURGERY  . Vulva surgery  02-01-12    cyst removal-pt 8 months pregnant  . Total knee arthroplasty  02/11/2012    Procedure: TOTAL KNEE ARTHROPLASTY;  Surgeon: Mauri Pole, MD;  Location: [facility];  Service: Orthopedics;  Laterality: Right;    There were no vitals filed for this visit.  Visit Diagnosis:  Right knee pain - Plan: PT plan of care cert/re-cert  Generalized weakness - Plan: PT plan of care cert/re-cert  Activity intolerance - Plan: PT plan of care cert/re-cert  Stiffness of right knee - Plan: PT plan of care cert/re-cert      Subjective Assessment - 10/05/14 1627  Subjective Sore after last visit but the TENS did help.  Still doing one at a time on steps.  A little less pain walking now.     Currently in Pain? Yes   Pain Score 4    Pain Location Knee   Pain Orientation Right   Pain Descriptors / Indicators Aching   Pain Type Chronic pain   Pain Onset More than a month ago   Pain Frequency Constant   Aggravating Factors  walking, sit to stand, getting down on knees to play with grandchild            [facility] PT Assessment - 10/05/14 1634    Observation/Other Assessments   Focus on Therapeutic Outcomes (FOTO)  62% limit   AROM   Right Knee Extension 0  pain with terminal extension   Right Knee Flexion 124  pain   Strength   Right Hip Extension 4/5   Right Hip ABduction 4/5  inner thigh pull                     [facility] Adult PT Treatment/Exercise - 10/05/14 1633    Knee/Hip Exercises:  Aerobic   Nustep L4 8 min UEs/LEs   Knee/Hip Exercises: Standing   Lateral Step Up Right;Left;1 set;10 reps;Hand Hold: 2;Step Height: 4"   Step Down Right;1 set   Lunge Walking - Round Trips mini lunges with UE support 10xR/L   Walking with Sports Cord side stepping no resist 5x   Other Standing Knee Exercises R/ L hip extension isometrics against wall 5x 5 sec hold   Other Standing Knee Exercises hip abd isometrics 5x R/L 5 sec holds   Knee/Hip Exercises: Seated   Clamshell with TheraBand Red   Knee/Hip Flexion red band around feet 10x right left with ankle DF   Knee/Hip Exercises: Sidelying   Clams 15 red band right   Electrical Stimulation   Electrical Stimulation Location right knee   Electrical Stimulation Action IFC   Electrical Stimulation Parameters 21 ma   Electrical Stimulation Goals Pain                  PT Short Term Goals - 10/05/14 1630    PT SHORT TERM GOAL #1   Title I with initial HEP   Status Achieved           PT Long Term Goals - 10/05/14 1630    PT LONG TERM GOAL #1   Title I with advanced HEP   Time 4   Period Weeks   Status On-going   PT LONG TERM GOAL #2   Title improved R hip strength to 4+/5 or better to improve funciton   Time 4   Period Weeks   Status On-going   PT LONG TERM GOAL #3   Title decreased pain with ambulation by 40%   Time 4   Period Weeks   Status Partially Met   PT LONG TERM GOAL #4   Title able to climb stairs with 25% less pain   Time 4   Period Weeks   Status On-going               Plan - 10/05/14 1704    Clinical Impression Statement Good improvement in knee AROM and gradual strengthening of right quads and gluteals.  FOTO functional outcome score improved from 66% to 62%.  Therapist closely monitoring pain response with all exercises (open and closed chain strengthening).  Good pain relief with IFC and  she would benefit from a home TENS unit for pain control.  Request faxed to MD last week.   Progressing with STGS and LTGs.  Recommend continuation of PT for 4 weeks for further progress toward remaining goals.     Pt will benefit from skilled therapeutic intervention in order to improve on the following deficits Decreased range of motion;Decreased endurance;Decreased activity tolerance;Pain;Impaired flexibility;Decreased strength;Increased edema   Rehab Potential Good   PT Frequency 2x / week   PT Duration 4 weeks   PT Treatment/Interventions ADLs/Self Care Home Management;Electrical Stimulation;Cryotherapy;Iontophoresis 63m/ml Dexamethasone;Ultrasound;Gait training;Stair training;Therapeutic exercise;Manual techniques;Patient/family education;Neuromuscular re-education;Passive range of motion;Dry needling;Taping;Vasopneumatic Device   PT Next Visit Plan Recertification completed including FOTO recheck;  G code needed in 2 more visits;awaiting TENS order from MD, continue IFC in clinic for pain control;  continue strengthening of right LE as tolerated.          Problem List Patient Active Problem List   Diagnosis Date Noted  . Pes anserine bursitis 09/30/2014  . Hyperparathyroidism 08/18/2014  . Abdominal pain 08/18/2014  . Medicare annual wellness visit, subsequent 08/18/2014  . Osteopenia 08/05/2014  . Capsulitis of ankle 02/23/2014  . Leg length discrepancy 02/23/2014  . Plantar fasciitis of left foot 11/17/2013  . Pruritus 10/03/2013  . Narcolepsy without cataplexy 02/08/2013  . S/P right TKA 02/11/2012  . Motion sickness 12/24/2011  . Nausea & vomiting 12/24/2011  . Varicose veins of lower limb 10/02/2011  . Knee pain, right 09/20/2011  . Depression with anxiety 01/08/2011  . Back pain 01/08/2011  . Allergic rhinoconjunctivitis, seasonal and perennial 09/21/2010  . SCC (squamous cell carcinoma), face 09/19/2010  . Otitis media of left ear 09/19/2010  . Chills 07/12/2010  . Menopause 07/12/2010  . Ventral hernia 04/06/2010  . COUGH 04/06/2010  . ABDOMINAL PAIN,  GENERALIZED 04/06/2010  . ENCOPRESIS 03/21/2010  . DERMATITIS, ATOPIC 03/21/2010  . Diabetes mellitus type 2 in obese 03/07/2010  . DIABETIC  RETINOPATHY 03/07/2010  . OTHER HYPERPARATHYROIDISM 03/07/2010  . Vitamin D deficiency 03/07/2010  . Hyperlipidemia, mixed 03/07/2010  . Hypocalcemia 03/07/2010  . Iron deficiency anemia 03/07/2010  . TREMOR, ESSENTIAL 03/07/2010  . MITRAL REGURGITATION, 0 (MILD) 03/07/2010  . TRICUSPID REGURGITATION, MILD 03/07/2010  . ESSENTIAL HYPERTENSION, BENIGN 03/07/2010  . CARDIOMEGALY, MILD 03/07/2010  . DEGENERATIVE JOINT DISEASE, LEFT HIP 03/07/2010  . Disorder of bone and cartilage 03/07/2010  . Diarrhea 03/07/2010  . UNSPECIFIED URINARY INCONTINENCE 03/07/2010  . PERSONAL HX OF METHICILLIN RESIST STAPH AUREUS 03/07/2010  . DYSPNEA 11/24/2007  . Obesity 02/27/2007  . Obstructive sleep apnea 02/27/2007  . Seasonal and perennial allergic rhinitis 02/27/2007  . ESOPHAGEAL REFLUX 02/27/2007  . FIBROMYALGIA 02/27/2007    SAlvera Singh9/13/2016, 5:14 PM  [facility] [place] [address]Phone: [phone]  Fax:  [phone]   SRuben Im PT 10/05/2014 5:14 PM Phone: [phone]: [phone]

## 2014-10-06 ENCOUNTER — Encounter: Payer: Self-pay | Admitting: Internal Medicine

## 2014-10-07 ENCOUNTER — Encounter: Payer: Medicare Other | Admitting: Physical Therapy

## 2014-10-07 ENCOUNTER — Ambulatory Visit: Payer: Medicare Other | Admitting: Physical Therapy

## 2014-10-07 DIAGNOSIS — M25561 Pain in right knee: Secondary | ICD-10-CM | POA: Diagnosis not present

## 2014-10-07 DIAGNOSIS — M25661 Stiffness of right knee, not elsewhere classified: Secondary | ICD-10-CM | POA: Diagnosis not present

## 2014-10-07 DIAGNOSIS — R6889 Other general symptoms and signs: Secondary | ICD-10-CM

## 2014-10-07 DIAGNOSIS — R531 Weakness: Secondary | ICD-10-CM | POA: Diagnosis not present

## 2014-10-07 NOTE — Therapy (Signed)
[place], [place], [address] Phone: [phone]   Fax:  [phone]  Physical Therapy Treatment  Patient Details  Name: [name] MRN: [id number] Date of Birth: [birth year] Referring Provider:  Mosie Lukes, MD  Encounter Date: 10/07/2014      PT End of Session - 10/07/14 1348    Visit Number 9   Number of Visits 16   Date for PT Re-Evaluation 11/02/14   PT Start Time 0731   PT Stop Time 0758   PT Time Calculation (min) 27 min   Activity Tolerance Patient tolerated treatment well;No increased pain   Behavior During Therapy [facility] for tasks assessed/performed      Past Medical History  Diagnosis Date  . Obesity   . Esophageal reflux   . Fibromyalgia   . Asthma   . Allergy     rhinitis  . Sleep apnea, obstructive   . Bronchitis, acute 10/31/2010  . Otitis media of both ears 11/16/2010  . Depression with anxiety 01/08/2011  . Back pain 01/08/2011  . Sinusitis 06/11/2011  . Fatigue 06/11/2011  . Knee pain, right 09/20/2011  . Otitis media 09/20/2011  . Varicose veins of lower limb 10/02/2011  . Otitis media of left ear 09/19/2010  . Motion sickness 12/24/2011  . Sinusitis 12/24/2011  . Nausea & vomiting 12/24/2011  . Heart murmur   . Panic attacks   . Arthritis 02-01-12    osteoarthritis, Back pain, spine surgeries ? retain hardware cervial and  lumbar.  . Anemia 02-01-12    iron absorption problem  . Cancer 02-01-12    squamous cell -face  . Allergic state 09/21/2010  . Osteopenia 08/05/2014  . Hyperparathyroidism 08/18/2014  . Abdominal pain 08/18/2014    Past Surgical History  Procedure Laterality Date  . Bariatric surgery    . Lumbar disc surgery    . Bladder suspension      complicated by bowel nick/ clolostomy  . Gallbladder surgery  1978  . Appendectomy  1962  . Gastric bypass  2003    Patient also noted "gastric bypass resection - 2004"  . Cervical disc surgery      C-7 in 2004,  C-4 and C-5 in 2010  . Colostomy bag      Confirm with patient. Listed under medical conditions on form dated 07/18/09.  [name]      Per medical history form dated 07/18/09.  [name] Hernia repair  2009    Two hernias and abdominal reconstruction  . Ivc filter      prophyllactically- no hx DVT  . Carpal tunnel release  02-01-12    right  . Colostomy reversal  02-01-12  . Abdominal surgery  02-01-12    ABDOMINAL SURGERY  . Vulva surgery  02-01-12    cyst removal-pt 8 months pregnant  . Total knee arthroplasty  02/11/2012    Procedure: TOTAL KNEE ARTHROPLASTY;  Surgeon: Mauri Pole, MD;  Location: [facility];  Service: Orthopedics;  Laterality: Right;    There were no vitals filed for this visit.  Visit Diagnosis:  Right knee pain  Activity intolerance      Subjective Assessment - 10/07/14 0806    Subjective I can definately tell I'm getting better.  I see changes.  I am doing a lot more walking and don't notice I don't have pain until I get pain. Less pain with first gettin up from standing, likes the band exercises the most.  No  pain now.   Currently in Pain? No/denies                         [facility] Adult PT Treatment/Exercise - 10/07/14 0733    Self-Care   Self-Care --  TENS education/ issued                PT Education - 10/07/14 0810    Education provided Yes   Education Details TENS info use, precautions.   Person(s) Educated Patient   Methods Explanation;Demonstration;Verbal cues;Other (comment)  TENS info in unit   Comprehension Verbalized understanding;Returned demonstration          PT Short Term Goals - 10/05/14 1630    PT SHORT TERM GOAL #1   Title I with initial HEP   Status Achieved           PT Long Term Goals - 10/07/14 1428    PT LONG TERM GOAL #1   Title I with advanced HEP   Time 4   Period Weeks   Status On-going   PT LONG TERM GOAL #2   Title improved R hip strength to 4+/5 or better to improve funciton   Time  4   Period Weeks   Status Unable to assess   PT LONG TERM GOAL #3   Title decreased pain with ambulation by 40%   Baseline Improved, sometimes goes grocery sto               Plan - 10/07/14 1425    Clinical Impression Statement TENS issued today.  Should be ready for exercises next visit.  Pain and function improving.  G CODE NEXT visit   FOTO already done.     PT Next Visit Plan Needs GCode, FOTO already done.  Knee strengthening.   Consulted and Agree with Plan of Care Patient        Problem List Patient Active Problem List   Diagnosis Date Noted  . Pes anserine bursitis 09/30/2014  . Hyperparathyroidism 08/18/2014  . Abdominal pain 08/18/2014  . Medicare annual wellness visit, subsequent 08/18/2014  . Osteopenia 08/05/2014  . Capsulitis of ankle 02/23/2014  . Leg length discrepancy 02/23/2014  . Plantar fasciitis of left foot 11/17/2013  . Pruritus 10/03/2013  . Narcolepsy without cataplexy 02/08/2013  . S/P right TKA 02/11/2012  . Motion sickness 12/24/2011  . Nausea & vomiting 12/24/2011  . Varicose veins of lower limb 10/02/2011  . Knee pain, right 09/20/2011  . Depression with anxiety 01/08/2011  . Back pain 01/08/2011  . Allergic rhinoconjunctivitis, seasonal and perennial 09/21/2010  . SCC (squamous cell carcinoma), face 09/19/2010  . Otitis media of left ear 09/19/2010  . Chills 07/12/2010  . Menopause 07/12/2010  . Ventral hernia 04/06/2010  . COUGH 04/06/2010  . ABDOMINAL PAIN, GENERALIZED 04/06/2010  . ENCOPRESIS 03/21/2010  . DERMATITIS, ATOPIC 03/21/2010  . Diabetes mellitus type 2 in obese 03/07/2010  . DIABETIC  RETINOPATHY 03/07/2010  . OTHER HYPERPARATHYROIDISM 03/07/2010  . Vitamin D deficiency 03/07/2010  . Hyperlipidemia, mixed 03/07/2010  . Hypocalcemia 03/07/2010  . Iron deficiency anemia 03/07/2010  . TREMOR, ESSENTIAL 03/07/2010  . MITRAL REGURGITATION, 0 (MILD) 03/07/2010  . TRICUSPID REGURGITATION, MILD 03/07/2010  .  ESSENTIAL HYPERTENSION, BENIGN 03/07/2010  . CARDIOMEGALY, MILD 03/07/2010  . DEGENERATIVE JOINT DISEASE, LEFT HIP 03/07/2010  . Disorder of bone and cartilage 03/07/2010  . Diarrhea 03/07/2010  . UNSPECIFIED URINARY INCONTINENCE 03/07/2010  . PERSONAL HX OF METHICILLIN RESIST STAPH  AUREUS 03/07/2010  . DYSPNEA 11/24/2007  . Obesity 02/27/2007  . Obstructive sleep apnea 02/27/2007  . Seasonal and perennial allergic rhinitis 02/27/2007  . ESOPHAGEAL REFLUX 02/27/2007  . FIBROMYALGIA 02/27/2007    Delvonte Berenson  PTA 10/07/2014, 5:26 PM  [facility] [address] [place], [place], [address] Phone: [phone]   Fax:  [phone]

## 2014-10-12 ENCOUNTER — Ambulatory Visit
Admission: RE | Admit: 2014-10-12 | Discharge: 2014-10-12 | Disposition: A | Discharge status: Home / Self Care | Payer: Medicare Other | Source: Ambulatory Visit

## 2014-10-12 DIAGNOSIS — Z1231 Encounter for screening mammogram for malignant neoplasm of breast: Secondary | ICD-10-CM | POA: Diagnosis not present

## 2014-10-14 ENCOUNTER — Ambulatory Visit: Payer: Medicare Other | Admitting: Physical Therapy

## 2014-10-14 DIAGNOSIS — R6889 Other general symptoms and signs: Secondary | ICD-10-CM

## 2014-10-14 DIAGNOSIS — R531 Weakness: Secondary | ICD-10-CM | POA: Diagnosis not present

## 2014-10-14 DIAGNOSIS — M25561 Pain in right knee: Secondary | ICD-10-CM | POA: Diagnosis not present

## 2014-10-14 DIAGNOSIS — M25661 Stiffness of right knee, not elsewhere classified: Secondary | ICD-10-CM

## 2014-10-14 NOTE — Therapy (Addendum)
[place], [place], [address] Phone: [phone]   Fax:  [phone]  Physical Therapy Treatment/Discharge summary  Patient Details  Name: [name] MRN: [id number] Date of Birth: Feb 11, 1945 Referring Provider:  Mosie Lukes, MD  Encounter Date: 10/14/2014      PT End of Session - 10/14/14 0800    Visit Number 10   Number of Visits 16   Date for PT Re-Evaluation 11/02/14   PT Start Time 0731   PT Stop Time 0800   PT Time Calculation (min) 29 min   Activity Tolerance Patient tolerated treatment well   Behavior During Therapy [facility] for tasks assessed/performed      Past Medical History  Diagnosis Date  . Obesity   . Esophageal reflux   . Fibromyalgia   . Asthma   . Allergy     rhinitis  . Sleep apnea, obstructive   . Bronchitis, acute 10/31/2010  . Otitis media of both ears 11/16/2010  . Depression with anxiety 01/08/2011  . Back pain 01/08/2011  . Sinusitis 06/11/2011  . Fatigue 06/11/2011  . Knee pain, right 09/20/2011  . Otitis media 09/20/2011  . Varicose veins of lower limb 10/02/2011  . Otitis media of left ear 09/19/2010  . Motion sickness 12/24/2011  . Sinusitis 12/24/2011  . Nausea & vomiting 12/24/2011  . Heart murmur   . Panic attacks   . Arthritis 02-01-12    osteoarthritis, Back pain, spine surgeries ? retain hardware cervial and  lumbar.  . Anemia 02-01-12    iron absorption problem  . Cancer 02-01-12    squamous cell -face  . Allergic state 09/21/2010  . Osteopenia 08/05/2014  . Hyperparathyroidism 08/18/2014  . Abdominal pain 08/18/2014    Past Surgical History  Procedure Laterality Date  . Bariatric surgery    . Lumbar disc surgery    . Bladder suspension      complicated by bowel nick/ clolostomy  . Gallbladder surgery  1978  . Appendectomy  1962  . Gastric bypass  2003    Patient also noted "gastric bypass resection - 2004"  . Cervical disc surgery      C-7 in  2004, C-4 and C-5 in 2010  . Colostomy bag      Confirm with patient. Listed under medical conditions on form dated 07/18/09.  [name]      Per medical history form dated 07/18/09.  [name] Hernia repair  2009    Two hernias and abdominal reconstruction  . Ivc filter      prophyllactically- no hx DVT  . Carpal tunnel release  02-01-12    right  . Colostomy reversal  02-01-12  . Abdominal surgery  02-01-12    ABDOMINAL SURGERY  . Vulva surgery  02-01-12    cyst removal-pt 8 months pregnant  . Total knee arthroplasty  02/11/2012    Procedure: TOTAL KNEE ARTHROPLASTY;  Surgeon: Mauri Pole, MD;  Location: [facility];  Service: Orthopedics;  Laterality: Right;    There were no vitals filed for this visit.  Visit Diagnosis:  Activity intolerance  Right knee pain  Generalized weakness  Stiffness of right knee      Subjective Assessment - 10/14/14 0735    Subjective Doing better on the steps.  No pain, uses TENS some.  Knees wake her up every once in awhile. Much less pain meds. Able to get down on the floor to play with granddaughter.   Currently in  Pain? No/denies                         [facility] Adult PT Treatment/Exercise - 10/14/14 0743    Knee/Hip Exercises: Machines for Strengthening   Cybex Knee Flexion 2 plates 10 X each   Cybex Leg Press 1 plate 10 X each   Knee/Hip Exercises: Standing   Forward Step Up Right;Both;10 reps;Step Height: 6"   Knee/Hip Exercises: Supine   Heel Slides --  BOTH   Heel Slides Limitations 10 X RT hurts    Straight Leg Raises Limitations 10 X each  easier LT cues bracing abdominals    Hip Flexion RT 4/5   Knee/Hip Exercises: Sidelying   Hip ABduction Limitations 10X cues each   Clams 10 X each, cues some substitution                  PT Short Term Goals - 10/05/14 1630    PT SHORT TERM GOAL #1   Title I with initial HEP   Status Achieved           PT Long Term Goals - 10/14/14 0801    PT LONG TERM GOAL #1    Status On-going   PT LONG TERM GOAL #2   Title improved R hip strength to 4+/5 or better to improve funciton   Baseline hip flexion 4/5 both   Time 4   Period Weeks   Status On-going   PT LONG TERM GOAL #3   Title decreased pain with ambulation by 40%   Baseline at  least 40%   Time 4   Period Weeks   Status Achieved   PT LONG TERM GOAL #4   Title able to climb stairs with 25% less pain   Baseline doing better   Time 4   Period Weeks   Status On-going               Plan - 10/14/14 0800    Clinical Impression Statement Pain with walking goal,  starting to think about discharge   PT Next Visit Plan Prone knee flexion.   Consulted and Agree with Plan of Care Patient    G codes:  Mobility walking moving around  Goal CK Discharge CL   PHYSICAL THERAPY DISCHARGE SUMMARY  Visits from Start of Care: 10  Current functional level related to goals / functional outcomes: The patient made partial progress with goals.  She had good pain relief from TENS and now has a unit for home use.  She cancelled her last scheduled appointment (expected discharge) to follow up with the MD regarding the need for additional PT.  She has not called to resume and her chart has been inactive for 2 months.        Remaining deficits: See above   Education / Equipment: HEP; TENs  Plan: Patient agrees to discharge.  Patient goals were partially met. Patient is being discharged due to not returning since the last visit.  ?????        Problem List Patient Active Problem List   Diagnosis Date Noted  . Pes anserine bursitis 09/30/2014  . Hyperparathyroidism 08/18/2014  . Abdominal pain 08/18/2014  . Medicare annual wellness visit, subsequent 08/18/2014  . Osteopenia 08/05/2014  . Capsulitis of ankle 02/23/2014  . Leg length discrepancy 02/23/2014  . Plantar fasciitis of left foot 11/17/2013  . Pruritus 10/03/2013  . Narcolepsy without cataplexy 02/08/2013  . S/P right TKA 02/11/2012   .  Motion sickness 12/24/2011  . Nausea & vomiting 12/24/2011  . Varicose veins of lower limb 10/02/2011  . Knee pain, right 09/20/2011  . Depression with anxiety 01/08/2011  . Back pain 01/08/2011  . Allergic rhinoconjunctivitis, seasonal and perennial 09/21/2010  . SCC (squamous cell carcinoma), face 09/19/2010  . Otitis media of left ear 09/19/2010  . Chills 07/12/2010  . Menopause 07/12/2010  . Ventral hernia 04/06/2010  . COUGH 04/06/2010  . ABDOMINAL PAIN, GENERALIZED 04/06/2010  . ENCOPRESIS 03/21/2010  . DERMATITIS, ATOPIC 03/21/2010  . Diabetes mellitus type 2 in obese 03/07/2010  . DIABETIC  RETINOPATHY 03/07/2010  . OTHER HYPERPARATHYROIDISM 03/07/2010  . Vitamin D deficiency 03/07/2010  . Hyperlipidemia, mixed 03/07/2010  . Hypocalcemia 03/07/2010  . Iron deficiency anemia 03/07/2010  . TREMOR, ESSENTIAL 03/07/2010  . MITRAL REGURGITATION, 0 (MILD) 03/07/2010  . TRICUSPID REGURGITATION, MILD 03/07/2010  . ESSENTIAL HYPERTENSION, BENIGN 03/07/2010  . CARDIOMEGALY, MILD 03/07/2010  . DEGENERATIVE JOINT DISEASE, LEFT HIP 03/07/2010  . Disorder of bone and cartilage 03/07/2010  . Diarrhea 03/07/2010  . UNSPECIFIED URINARY INCONTINENCE 03/07/2010  . PERSONAL HX OF METHICILLIN RESIST STAPH AUREUS 03/07/2010  . DYSPNEA 11/24/2007  . Obesity 02/27/2007  . Obstructive sleep apnea 02/27/2007  . Seasonal and perennial allergic rhinitis 02/27/2007  . ESOPHAGEAL REFLUX 02/27/2007  . FIBROMYALGIA 02/27/2007    Ruben Im, PT 12/14/2014 5:38 PM Phone: [phone] Fax: [phone]  [facility] 10/14/2014, 8:56 AM  [facility] [address] [place], [place], [address] Phone: [phone]   Fax:  [phone]     Melvenia Needles, PTA 10/14/2014 8:56 AM Phone: [phone] Fax: [phone]

## 2014-10-18 ENCOUNTER — Ambulatory Visit (INDEPENDENT_AMBULATORY_CARE_PROVIDER_SITE_OTHER): Payer: Medicare Other | Admitting: Family Medicine

## 2014-10-18 ENCOUNTER — Encounter: Payer: Self-pay | Admitting: Family Medicine

## 2014-10-18 ENCOUNTER — Telehealth: Payer: Self-pay | Admitting: Family Medicine

## 2014-10-18 ENCOUNTER — Other Ambulatory Visit (INDEPENDENT_AMBULATORY_CARE_PROVIDER_SITE_OTHER): Payer: Medicare Other

## 2014-10-18 VITALS — BP 124/84 | HR 58 | Ht 59.5 in | Wt 207.0 lb

## 2014-10-18 DIAGNOSIS — M79672 Pain in left foot: Secondary | ICD-10-CM

## 2014-10-18 DIAGNOSIS — M722 Plantar fascial fibromatosis: Secondary | ICD-10-CM

## 2014-10-18 NOTE — Assessment & Plan Note (Signed)
With the acute nature of this I am concerned the patient may have a tear that is not appreciated at this time. Patient was put back in a Cam Walker. We discussed the possibility of injection versus prednisone. Patient has elected try more of an icing regimen. Patient will call if she changes her mind in the next 48 hours. Patient will continue all the other medications on a regular basis. Patient and will come back and see me again in 2 weeks for further evaluation and treatment.

## 2014-10-18 NOTE — Patient Instructions (Signed)
[name] I am glad to see you [name], especially at night Wear boot for a week.  Call me in 24- 48 hours and if not better we will do prednisone

## 2014-10-18 NOTE — Progress Notes (Signed)
[name] [address] [place], [place] [address] Phone: [phone] Subjective:     CC: Left heel pain  [id number] [name] is a 69 y.o. female coming in with complaint of left heel pain. Patient was found to have more of a posterior capsulitis previously. Patient has been doing very well and we had been seen her more for her right knee pain. Patient states starting over the course last 12 hours patient has had so much pain in this heel that she has not been able to ambulate. Patient states she has been feeling very good with the knee but unfortunately now has trouble with the heel. Patient states it is more of a dull aching sensation. Describes it as a throbbing as well. Rates the severity of 9 out of 10. Patient states she does not remember any injury. Was on her feet a lot on Saturday but no pain on Sunday.     Past medical history, social, surgical and family history all reviewed in electronic medical record.   Review of Systems: No headache, visual changes, nausea, vomiting, diarrhea, constipation, dizziness, abdominal pain, skin rash, fevers, chills, night sweats, weight loss, swollen lymph nodes, body aches, joint swelling, muscle aches, chest pain, shortness of breath, mood changes.   Objective Blood pressure 124/84, pulse 58, height 4' 11.5" (1.511 m), weight 207 lb (93.895 kg), SpO2 95 %.  General: No apparent distress alert and oriented x3 mood and affect normal, dressed appropriately.  HEENT: Pupils equal, extraocular movements intact  Respiratory: Patient's speak in full sentences and does not appear short of breath  Cardiovascular: No lower extremity edema, non tender, no erythema  Skin: Warm dry intact with no signs of infection or rash on extremities or on axial skeleton.  Abdomen: Soft nontender  Neuro: Cranial nerves II through XII are intact, neurovascularly intact in all extremities with 2+ DTRs and 2+ pulses.  Lymph: No  lymphadenopathy of posterior or anterior cervical chain or axillae bilaterally.  Gait antalgic gait MSK:  Non tender with full range of motion and good stability and symmetric strength and tone of shoulders, elbows, wrist, hip, knee and ankles bilaterally.  Patient does have a leg length discrepancy with left leg being quarter inch shorter than the right Ankle: Left No visible erythema or swelling. Patient is having mild breakdown of the longitudinal and transverse arch Range of motion is full in all directions. Strength is 5/5 in all directions. Stable lateral and medial ligaments; squeeze test and kleiger test unremarkable; Talar dome nontender; No pain at base of 5th MT; No tenderness over cuboid; No tenderness over N spot or navicular prominence No tenderness on posterior aspects of lateral and medial malleolus No sign of peroneal tendon subluxations or tenderness to palpation Negative tarsal tunnel tinel's Able to walk 4 steps. Patient is somewhat tender over the medial calcaneal region where the plantar fascia inserts as well as at the insertion of the Achilles. Contralateral ankle unremarkable  MSK US performed of: Left ankle This study was ordered, performed, and interpreted by Charlann Boxer D.O.  Foot/Ankle:   All structures visualized.   Talar dome unremarkable  Ankle mortise without effusion. Peroneus longus and brevis tendons unremarkable on long and transverse views without sheath effusions. Posterior tibialis, flexor hallucis longus, and flexor digitorum longus tendons unremarkable on long and transverse views without sheath effusions. Achilles tendon visualized with very minimal hypoechoic changes. Anterior Talofibular Ligament and Calcaneofibular Ligaments unremarkable and intact. Deltoid Ligament unremarkable  and intact. Plantar fascia has significant hypoechoic changes but no increasing Doppler flow noted. Patient does have a layer of hypoechoic changes around the  calcaneus itself.  IMPRESSION:  Heel contusion with possible plantar fasciitis        Impression and Recommendations:     This case required medical decision making of moderate complexity.

## 2014-10-18 NOTE — Progress Notes (Signed)
Pre visit review using our clinic review tool, if applicable. No additional management support is needed unless otherwise documented below in the visit note. 

## 2014-10-18 NOTE — Telephone Encounter (Signed)
Spoke to pt, she is coming in at [name] today.

## 2014-10-18 NOTE — Telephone Encounter (Signed)
Patient states she can not stand on left and is in pain.  She is requesting to be worked in today or if not tomorrow afternoon.

## 2014-10-20 ENCOUNTER — Other Ambulatory Visit: Payer: Self-pay | Admitting: Family Medicine

## 2014-10-21 ENCOUNTER — Ambulatory Visit: Payer: Medicare Other | Admitting: Family Medicine

## 2014-10-22 ENCOUNTER — Encounter: Payer: Medicare Other | Admitting: Physical Therapy

## 2014-10-23 DIAGNOSIS — Z23 Encounter for immunization: Secondary | ICD-10-CM | POA: Diagnosis not present

## 2014-11-01 ENCOUNTER — Other Ambulatory Visit (INDEPENDENT_AMBULATORY_CARE_PROVIDER_SITE_OTHER): Payer: Medicare Other

## 2014-11-01 ENCOUNTER — Ambulatory Visit (INDEPENDENT_AMBULATORY_CARE_PROVIDER_SITE_OTHER): Payer: Medicare Other | Admitting: Family Medicine

## 2014-11-01 ENCOUNTER — Encounter: Payer: Self-pay | Admitting: Family Medicine

## 2014-11-01 VITALS — BP 126/80 | HR 65 | Ht 59.5 in | Wt 209.0 lb

## 2014-11-01 DIAGNOSIS — M722 Plantar fascial fibromatosis: Secondary | ICD-10-CM | POA: Diagnosis not present

## 2014-11-01 DIAGNOSIS — M79672 Pain in left foot: Secondary | ICD-10-CM

## 2014-11-01 NOTE — Assessment & Plan Note (Signed)
Discussed with patient again about treatment options. Patient has elected try conservative therapy. Patient will continue with the vitamin D supplementation as well as the proper shoewear as well as the heel lift. We discussed that we should repeat an injection if she does not make improvement in the next 2-3 weeks. Some of her other underlying problem such as vitamin D deficiency as well as the possibility of iron deficiency. Patient has given the history of needing transfusions previously. Depending on lambs we will discuss further treatment option. Spent  25 minutes with patient face-to-face and had greater than 50% of counseling including as described above in assessment and plan.

## 2014-11-01 NOTE — Progress Notes (Signed)
Pre visit review using our clinic review tool, if applicable. No additional management support is needed unless otherwise documented below in the visit note. 

## 2014-11-01 NOTE — Progress Notes (Signed)
  [name] [address] [place], [place] [address] Phone: [phone] Subjective:     CC: Left heel pain  [id number] [name] is a 69 y.o. female coming in with complaint of left heel pain. Patient was found to have more of a posterior capsulitis previously. Patient was also found to have a heel contusion as well as plantar fasciitis. Patient was put in the boot for approximate 6 days and states that the pain seemed to resolve. Has been back in the shoe for short course of time. Seems to be doing fairly well but then continues to have some discomfort. Patient is more concerned because she thinks the pain is causing more fatigued as well as cramping in her legs as well as her hands intermittently. When asked patient states that she has had trouble with iron previously. Awaiting lambs in 2 days. Has had low vitamin D supplementation previously as well.     Past medical history, social, surgical and family history all reviewed in electronic medical record.   Review of Systems: No headache, visual changes, nausea, vomiting, diarrhea, constipation, dizziness, abdominal pain, skin rash, fevers, chills, night sweats, weight loss, swollen lymph nodes, body aches, joint swelling, muscle aches, chest pain, shortness of breath, mood changes.   Objective Blood pressure 126/80, pulse 65, height 4' 11.5" (1.511 m), weight 209 lb (94.802 kg), SpO2 95 %.  General: No apparent distress alert and oriented x3 mood and affect normal, dressed appropriately.  HEENT: Pupils equal, extraocular movements intact  Respiratory: Patient's speak in full sentences and does not appear short of breath  Cardiovascular: No lower extremity edema, non tender, no erythema  Skin: Warm dry intact with no signs of infection or rash on extremities or on axial skeleton.  Abdomen: Soft nontender  Neuro: Cranial nerves II through XII are intact, neurovascularly intact in all extremities with 2+  DTRs and 2+ pulses.  Lymph: No lymphadenopathy of posterior or anterior cervical chain or axillae bilaterally.  Gait antalgic gait MSK:  Non tender with full range of motion and good stability and symmetric strength and tone of shoulders, elbows, wrist, hip, knee and ankles bilaterally. Arthritic changes of multiple joints. Patient does have a leg length discrepancy with left leg being quarter inch shorter than the right   MSK US performed of: Left ankle This study was ordered, performed, and interpreted by Charlann Boxer D.O.  Foot/Ankle:   All structures visualized.   Talar dome unremarkable  Ankle mortise without effusion. Peroneus longus and brevis tendons unremarkable on long and transverse views without sheath effusions. Posterior tibialis, flexor hallucis longus, and flexor digitorum longus tendons unremarkable on long and transverse views without sheath effusions. Achilles tendon visualized with very minimal hypoechoic changes. Anterior Talofibular Ligament and Calcaneofibular Ligaments unremarkable and intact. Deltoid Ligament unremarkable and intact. Plantar fascia continues to have significant hypoechoic changes. Underlying bone involvement of seen previously is completely resolved.  IMPRESSION:  Healed heel contusion with continued plantar fasciitis with posterior capsulitis        Impression and Recommendations:     This case required medical decision making of moderate complexity.

## 2014-11-01 NOTE — Patient Instructions (Signed)
[name] [name] still  We will get labs and if iron is low i would like to know  I will discuss results and we may need to do a infusion Otherwise if foot is not better in 2-3 weeks or worsen then I want to do an injection.

## 2014-11-03 ENCOUNTER — Other Ambulatory Visit: Payer: Medicare Other

## 2014-11-03 ENCOUNTER — Ambulatory Visit: Payer: Medicare Other | Admitting: Internal Medicine

## 2014-11-04 ENCOUNTER — Telehealth: Payer: Self-pay | Admitting: Family Medicine

## 2014-11-04 DIAGNOSIS — D509 Iron deficiency anemia, unspecified: Secondary | ICD-10-CM

## 2014-11-04 NOTE — Telephone Encounter (Signed)
Pt said that there was suppose to be some type blood work put in .  She went to lab and they told her they did not have an order for anything from Dr Tamala Julian ?

## 2014-11-08 NOTE — Telephone Encounter (Signed)
Order entered. Pt made aware.

## 2014-11-10 ENCOUNTER — Other Ambulatory Visit (INDEPENDENT_AMBULATORY_CARE_PROVIDER_SITE_OTHER): Payer: Medicare Other

## 2014-11-10 DIAGNOSIS — E559 Vitamin D deficiency, unspecified: Secondary | ICD-10-CM | POA: Diagnosis not present

## 2014-11-10 DIAGNOSIS — D509 Iron deficiency anemia, unspecified: Secondary | ICD-10-CM | POA: Diagnosis not present

## 2014-11-10 DIAGNOSIS — E213 Hyperparathyroidism, unspecified: Secondary | ICD-10-CM | POA: Diagnosis not present

## 2014-11-10 LAB — CBC WITH DIFFERENTIAL/PLATELET
BASOS PCT: 0.7 % (ref 0.0–3.0)
Basophils Absolute: 0.1 10*3/uL (ref 0.0–0.1)
Eosinophils Absolute: 0.3 10*3/uL (ref 0.0–0.7)
Eosinophils Relative: 3.8 % (ref 0.0–5.0)
HEMATOCRIT: 37.5 % (ref 36.0–46.0)
Hemoglobin: 12.3 g/dL (ref 12.0–15.0)
LYMPHS PCT: 18.9 % (ref 12.0–46.0)
Lymphs Abs: 1.3 10*3/uL (ref 0.7–4.0)
MCHC: 32.8 g/dL (ref 30.0–36.0)
MCV: 90.7 fl (ref 78.0–100.0)
Monocytes Absolute: 0.6 10*3/uL (ref 0.1–1.0)
Monocytes Relative: 8.1 % (ref 3.0–12.0)
NEUTROS PCT: 68.5 % (ref 43.0–77.0)
Neutro Abs: 4.8 10*3/uL (ref 1.4–7.7)
PLATELETS: 247 10*3/uL (ref 150.0–400.0)
RBC: 4.13 Mil/uL (ref 3.87–5.11)
RDW: 13.9 % (ref 11.5–15.5)
WBC: 7 10*3/uL (ref 4.0–10.5)

## 2014-11-10 LAB — SEDIMENTATION RATE: Sed Rate: 5 mm/hr (ref 0–22)

## 2014-11-10 LAB — C-REACTIVE PROTEIN: CRP: 0.1 mg/dL — ABNORMAL LOW (ref 0.5–20.0)

## 2014-11-10 LAB — IBC PANEL
Iron: 71 ug/dL (ref 42–145)
Saturation Ratios: 19.2 % — ABNORMAL LOW (ref 20.0–50.0)
TRANSFERRIN: 264 mg/dL (ref 212.0–360.0)

## 2014-11-10 LAB — VITAMIN D 25 HYDROXY (VIT D DEFICIENCY, FRACTURES): VITD: 15.12 ng/mL — ABNORMAL LOW (ref 30.00–100.00)

## 2014-11-11 LAB — PTH, INTACT AND CALCIUM
Calcium: 8.5 mg/dL — ABNORMAL LOW (ref 8.7–10.3)
PTH: 47 pg/mL (ref 15–65)

## 2014-11-12 ENCOUNTER — Other Ambulatory Visit: Payer: Self-pay | Admitting: Family Medicine

## 2014-11-12 DIAGNOSIS — E213 Hyperparathyroidism, unspecified: Secondary | ICD-10-CM

## 2014-11-12 DIAGNOSIS — IMO0002 Reserved for concepts with insufficient information to code with codable children: Secondary | ICD-10-CM

## 2014-11-12 DIAGNOSIS — E559 Vitamin D deficiency, unspecified: Secondary | ICD-10-CM

## 2014-11-12 MED ORDER — VITAMIN D (ERGOCALCIFEROL) 1.25 MG (50000 UNIT) PO CAPS: 50000.0000 [IU] | ORAL_CAPSULE | ORAL | Status: DC

## 2014-11-15 LAB — VITAMIN D 1,25 DIHYDROXY
VITAMIN D 1, 25 (OH) TOTAL: 78 pg/mL — AB (ref 18–72)
Vitamin D2 1, 25 (OH)2: 51 pg/mL
Vitamin D3 1, 25 (OH)2: 27 pg/mL

## 2014-11-19 ENCOUNTER — Telehealth: Payer: Self-pay | Admitting: Internal Medicine

## 2014-11-19 NOTE — Telephone Encounter (Signed)
That's fine. Thank you for verifying her address. I will make it up and mail it out either Mon. Or Tues.. I will call the pt. And let her know. Nothing further needed.

## 2014-11-19 NOTE — Telephone Encounter (Signed)
Spoke with pt, states that pt does not have any vaccine request forms but pt needs refill on allergy vaccine.  Verified address on file.    Tammy Scott please advise.  Thanks!

## 2014-11-22 ENCOUNTER — Ambulatory Visit (INDEPENDENT_AMBULATORY_CARE_PROVIDER_SITE_OTHER): Payer: Medicare Other

## 2014-11-22 ENCOUNTER — Telehealth: Payer: Self-pay | Admitting: Internal Medicine

## 2014-11-22 DIAGNOSIS — J309 Allergic rhinitis, unspecified: Secondary | ICD-10-CM

## 2014-11-22 NOTE — Telephone Encounter (Signed)
Allergy Serum Extract Date Mixed: 11/22/14 Vial: 2 Strength: 1:10 Here/Mail/Pick Up: mail Mixed By: Laurette Schimke

## 2014-11-23 ENCOUNTER — Other Ambulatory Visit (INDEPENDENT_AMBULATORY_CARE_PROVIDER_SITE_OTHER): Payer: Medicare Other

## 2014-11-23 ENCOUNTER — Ambulatory Visit (INDEPENDENT_AMBULATORY_CARE_PROVIDER_SITE_OTHER): Payer: Medicare Other | Admitting: Family Medicine

## 2014-11-23 ENCOUNTER — Encounter: Payer: Self-pay | Admitting: Family Medicine

## 2014-11-23 VITALS — BP 124/80 | HR 65 | Ht 59.5 in | Wt 214.0 lb

## 2014-11-23 DIAGNOSIS — M79672 Pain in left foot: Secondary | ICD-10-CM | POA: Diagnosis not present

## 2014-11-23 DIAGNOSIS — M722 Plantar fascial fibromatosis: Secondary | ICD-10-CM

## 2014-11-23 NOTE — Assessment & Plan Note (Signed)
Patient given another injection at this time. I do think because patient is not absorbing vitamin D and iron well that I think that this is contributing to the slow healing aspect. We discussed with patient about if patient does not make any significant improvement of bisphosphonates may be necessary. Patient's not willing to wear tennis shoes or Cam Walker at work. Patient will continue the home exercises, icing, and topical anti-inflammatories patient come back and see me again in 4 weeks for further evaluation and treatment. Please see patient instructions otherwise.

## 2014-11-23 NOTE — Progress Notes (Signed)
[name] [address] [place], [place] [address] Phone: [phone] Subjective:     CC: Left heel pain  [id number] [name] is a 69 y.o. female coming in with complaint of left heel pain. Patient did have more of a heel contusion as well as a plantar fasciitis. Patient states that unfortunately it seems to be worsening not improving. Did have some results with formal physical therapy but does not want to do that again. Patient is becoming frustrated with no significant improvement. Patient is increasing her vitamin D supplementation. Patient did have her iron checked and has low ferritin but does not feel that she needs a transfusion at this time. Continues all other medications. States that it is affecting some of her daily activities. He is unable to wear any other shoes at work. Patient is becoming very frustrated.     Past medical history, social, surgical and family history all reviewed in electronic medical record.   Review of Systems: No headache, visual changes, nausea, vomiting, diarrhea, constipation, dizziness, abdominal pain, skin rash, fevers, chills, night sweats, weight loss, swollen lymph nodes, body aches, joint swelling, muscle aches, chest pain, shortness of breath, mood changes.   Objective Blood pressure 124/80, pulse 65, height 4' 11.5" (1.511 m), weight 214 lb (97.07 kg), SpO2 95 %.  General: No apparent distress alert and oriented x3 mood and affect normal, dressed appropriately.  HEENT: Pupils equal, extraocular movements intact  Respiratory: Patient's speak in full sentences and does not appear short of breath  Cardiovascular: No lower extremity edema, non tender, no erythema  Skin: Warm dry intact with no signs of infection or rash on extremities or on axial skeleton.  Abdomen: Soft nontender  Neuro: Cranial nerves II through XII are intact, neurovascularly intact in all extremities with 2+ DTRs and 2+ pulses.  Lymph: No  lymphadenopathy of posterior or anterior cervical chain or axillae bilaterally.  Gait antalgic gait MSK:  Non tender with full range of motion and good stability and symmetric strength and tone of shoulders, elbows, wrist, hip, knee and ankles bilaterally. Arthritic changes of multiple joints. Patient does have a leg length discrepancy with left leg being quarter inch shorter than the right  Exam still gives patient severe tenderness over the origin of the plantar fascial.   MSK US performed of: Left ankle This study was ordered, performed, and interpreted by Charlann Boxer D.O.  Foot/Ankle:   All structures visualized.   Talar dome unremarkable  Ankle mortise without effusion. Peroneus longus and brevis tendons unremarkable on long and transverse views without sheath effusions. Posterior tibialis, flexor hallucis longus, and flexor digitorum longus tendons unremarkable on long and transverse views without sheath effusions. Achilles tendon visualized with very minimal hypoechoic changes. Anterior Talofibular Ligament and Calcaneofibular Ligaments unremarkable and intact. Deltoid Ligament unremarkable and intact. Plantar fascia continues to have significant hypoechoic changes an worse than previous exam.  IMPRESSION:  Worsening plantar fascia  Procedure: Real-time Ultrasound Guided Injection of left plantar fascial Device: GE Logiq E  Ultrasound guided injection is preferred based studies that show increased duration, increased effect, greater accuracy, decreased procedural pain, increased response rate, and decreased cost with ultrasound guided versus blind injection.  Verbal informed consent obtained.  Time-out conducted.  Noted no overlying erythema, induration, or other signs of local infection.  Skin prepped in a sterile fashion.  Local anesthesia: Topical Ethyl chloride.  With sterile technique and under real time ultrasound guidance:  With a 25-gauge 1  inch needle patient was  injected with a total of 0.5 mL of 0.5% Marcaine and 0.5 mL of Kenalog 40 mg/dL from the medial calcaneal area to the origin of the plantar fascial. Patient tolerated procedure very well. Completed without difficulty  Pain immediately resolved suggesting accurate placement of the medication.  Advised to call if fevers/chills, erythema, induration, drainage, or persistent bleeding.  Images permanently stored and available for review in the ultrasound unit.  Impression: Technically successful ultrasound guided injection.        Impression and Recommendations:     This case required medical decision making of moderate complexity.

## 2014-11-23 NOTE — Progress Notes (Signed)
Pre visit review using our clinic review tool, if applicable. No additional management support is needed unless otherwise documented below in the visit note. 

## 2014-11-23 NOTE — Patient Instructions (Addendum)
[name] [name] is your friend Stay active and do the exercises Injected today and hope it helps Email me in 2 weeks and if not better we should try fosamax

## 2014-11-24 ENCOUNTER — Other Ambulatory Visit: Payer: Medicare Other

## 2014-12-06 ENCOUNTER — Ambulatory Visit (HOSPITAL_BASED_OUTPATIENT_CLINIC_OR_DEPARTMENT_OTHER)
Admission: RE | Admit: 2014-12-06 | Discharge: 2014-12-06 | Disposition: A | Discharge status: Home / Self Care | Payer: Medicare Other | Source: Ambulatory Visit | Attending: Family | Admitting: Family

## 2014-12-06 ENCOUNTER — Ambulatory Visit (INDEPENDENT_AMBULATORY_CARE_PROVIDER_SITE_OTHER): Payer: Medicare Other | Admitting: Family

## 2014-12-06 ENCOUNTER — Encounter (INDEPENDENT_AMBULATORY_CARE_PROVIDER_SITE_OTHER): Payer: Self-pay

## 2014-12-06 ENCOUNTER — Encounter: Payer: Self-pay | Admitting: Family

## 2014-12-06 VITALS — BP 150/90 | HR 54 | Temp 98.3°F | Resp 18 | Ht 59.0 in | Wt 208.4 lb

## 2014-12-06 DIAGNOSIS — R197 Diarrhea, unspecified: Secondary | ICD-10-CM

## 2014-12-06 DIAGNOSIS — J029 Acute pharyngitis, unspecified: Secondary | ICD-10-CM | POA: Diagnosis not present

## 2014-12-06 DIAGNOSIS — M79651 Pain in right thigh: Secondary | ICD-10-CM | POA: Insufficient documentation

## 2014-12-06 DIAGNOSIS — M25551 Pain in right hip: Secondary | ICD-10-CM

## 2014-12-06 LAB — POCT RAPID STREP A (OFFICE): Rapid Strep A Screen: NEGATIVE

## 2014-12-06 NOTE — Progress Notes (Signed)
Subjective:    Patient ID: [name], female    DOB: [birth year], 69 y.o.   MRN: [id number]  HPI  [name] is a 69 yr old female who presents today with several concerns.    Reports HA x 1 week.  Had N/V and sore throat yesterday.  + ear pain. Feels miserable.  Took advil for HA yesterday which helped a bit.  Also reports + thigh pain/cramping x 2 weeks.  Reports 2 week hx of diarrhea- stopped vit D supplement with only slight improvement in her diarrhea symptoms. Reports 1 stool every 2 hours last few days.   Review of Systems    see HPI  Past Medical History  Diagnosis Date  . Obesity   . Esophageal reflux   . Fibromyalgia   . Asthma   . Allergy     rhinitis  . Sleep apnea, obstructive   . Bronchitis, acute 10/31/2010  . Otitis media of both ears 11/16/2010  . Depression with anxiety 01/08/2011  . Back pain 01/08/2011  . Sinusitis 06/11/2011  . Fatigue 06/11/2011  . Knee pain, right 09/20/2011  . Otitis media 09/20/2011  . Varicose veins of lower limb 10/02/2011  . Otitis media of left ear 09/19/2010  . Motion sickness 12/24/2011  . Sinusitis 12/24/2011  . Nausea & vomiting 12/24/2011  . Heart murmur   . Panic attacks   . Arthritis 02-01-12    osteoarthritis, Back pain, spine surgeries ? retain hardware cervial and  lumbar.  . Anemia 02-01-12    iron absorption problem  . Cancer ([place]) 02-01-12    squamous cell -face  . Allergic state 09/21/2010  . Osteopenia 08/05/2014  . Hyperparathyroidism ([place]) 08/18/2014  . Abdominal pain 08/18/2014    Social History   Social History  . Marital Status: Married    Spouse Name: N/A  . Number of Children: N/A  . Years of Education: N/A   Occupational History  . Not on file.   Social History Main Topics  . Smoking status: Never Smoker   . Smokeless tobacco: Never Used  . Alcohol Use: No     Comment: special occasions  . Drug Use: No  . Sexual Activity: Yes   Other Topics Concern  . Not on file   Social History  Narrative    Past Surgical History  Procedure Laterality Date  . Bariatric surgery    . Lumbar disc surgery    . Bladder suspension      complicated by bowel nick/ clolostomy  . Gallbladder surgery  1978  . Appendectomy  1962  . Gastric bypass  2003    Patient also noted "gastric bypass resection - 2004"  . Cervical disc surgery      C-7 in 2004, C-4 and C-5 in 2010  . Colostomy bag      Confirm with patient. Listed under medical conditions on form dated 07/18/09.  [name]      Per medical history form dated 07/18/09.  [name] Hernia repair  2009    Two hernias and abdominal reconstruction  . Ivc filter      prophyllactically- no hx DVT  . Carpal tunnel release  02-01-12    right  . Colostomy reversal  02-01-12  . Abdominal surgery  02-01-12    ABDOMINAL SURGERY  . Vulva surgery  02-01-12    cyst removal-pt 8 months pregnant  . Total knee arthroplasty  02/11/2012    Procedure: TOTAL KNEE ARTHROPLASTY;  Surgeon: Mauri Pole,  MD;  Location: [facility];  Service: Orthopedics;  Laterality: Right;    Family History  Problem Relation Age of Onset  . Alcohol abuse Mother   . Other Mother     accidental med overdose  . Emphysema Mother     smoked  . Bipolar disorder Mother   . Other Father     CHF  . Diabetes Father   . Cancer Father     throat ca/ smoked  . Alcohol abuse Father   . Stroke Father   . Hypertension Brother   . Hyperlipidemia Brother   . Obesity Brother   . Heart attack Maternal Grandfather   . Heart attack Paternal Grandmother   . Heart attack Paternal Grandfather   . Arthritis Son     psoriatic  . Arthritis Son     psoriatic  . Obesity Son     Allergies  Allergen Reactions  . Hydrocodone      nausea and vomiting and headaches.  . Advil [Ibuprofen]     itching  . Cephalexin      Neuropathy  . Codeine     Crazy in the head, bp drops  . Levofloxacin Itching  . Meperidine Hcl     B/P drops  . Morphine And Related      States" extreme migraines"   . Oxycodone-Acetaminophen Other (See Comments)    Crazy in head, drop in bp  . Sulfonamide Derivatives Nausea And Vomiting    Stomach upset  . Adhesive [Tape] Rash    Latex in adhesive tape. Please use paper tape  . Erythromycin Diarrhea and Rash  . Penicillins Nausea And Vomiting and Rash    Current Outpatient Prescriptions on File Prior to Visit  Medication Sig Dispense Refill  . ALPRAZolam (XANAX) 0.25 MG tablet Take 0.25 mg by mouth daily as needed. For anxiety    . aspirin 81 MG tablet Take 81 mg by mouth daily.    [name] atorvastatin (LIPITOR) 10 MG tablet Take 10 mg by mouth daily.    . bifidobacterium infantis (ALIGN) capsule Take 1 capsule by mouth daily.     . DULoxetine (CYMBALTA) 60 MG capsule Take 60 mg by mouth every morning.     . DULoxetine (CYMBALTA) 60 MG capsule TAKE ONE CAPSULE BY MOUTH ONCE DAILY 90 capsule 0  . methylphenidate (RITALIN) 10 MG tablet Take 10 mg by mouth 2 (two) times daily.    . montelukast (SINGULAIR) 10 MG tablet Take 1 tablet (10 mg total) by mouth at bedtime. 90 tablet 3  . NON FORMULARY once a week.     . NON FORMULARY CPAP- set on [facility] [employer].    . NONFORMULARY OR COMPOUNDED ITEM Allergy Vaccine 1:10 Given at Home    . omeprazole (PRILOSEC OTC) 20 MG tablet Take 20 mg by mouth daily.    . ranitidine (ZANTAC) 150 MG tablet 1 tablet qam    . traMADol (ULTRAM) 50 MG tablet Take 1 tablet (50 mg total) by mouth at bedtime as needed. 30 tablet 0  . traZODone (DESYREL) 100 MG tablet 1/2 - 1 tab for sleep as directed 90 tablet 1  . VESICARE 10 MG tablet TAKE ONE TABLET BY MOUTH ONCE DAILY 30 tablet 5  . Vitamin D, Ergocalciferol, (DRISDOL) 50000 UNITS CAPS capsule Take 1 capsule (50,000 Units total) by mouth 2 (two) times a week. 20 capsule 2   No current facility-administered medications on file prior to visit.    BP 150/90 mmHg  Pulse  54  Temp(Src) 98.3 F (36.8 C) (Oral)  Resp 18  Ht 4\' 11"  (1.499 m)  Wt 208 lb 6.4 oz (94.53 kg)  BMI 42.07  kg/m2  SpO2 98%    Objective:   Physical Exam  Constitutional: [name] is oriented to person, place, and time. [name] appears well-developed and well-nourished.  HENT:  Head: Normocephalic and atraumatic.  Right Ear: Tympanic membrane and ear canal normal.  Left Ear: Tympanic membrane and ear canal normal.  Mouth/Throat: No oropharyngeal exudate, posterior oropharyngeal edema or posterior oropharyngeal erythema.  Cardiovascular: Normal rate, regular rhythm and normal heart sounds.   No murmur heard. Pulmonary/Chest: Effort normal and breath sounds normal. No respiratory distress. [name] has no wheezes.  Abdominal: Soft. Bowel sounds are normal. [name] exhibits no distension. There is no tenderness. There is no rebound.  Musculoskeletal: [name] exhibits no edema.  Lymphadenopathy:    [name] has no cervical adenopathy.  Neurological: [name] is alert and oriented to person, place, and time.  Skin: Skin is warm and dry.  Psychiatric: [name] has a normal mood and affect. Her behavior is normal. Judgment and thought content normal.          Assessment & Plan:  Sore throat- rapid strep is negative.  R thigh pain- LE doppler negative for DVT  Diarrhea- will obtain stool studies to further evaluate.  Overall malaise could be viral in etiology. Obtain CMET, CBC, UA to exclude UTI.  Advised pt on importance of maintaining hydration.

## 2014-12-06 NOTE — Patient Instructions (Addendum)
Please complete lab work prior to leaving.  Complete ultrasound on the first floor.  Call if symptoms worsen or if not improved in 3 days.

## 2014-12-07 ENCOUNTER — Other Ambulatory Visit: Payer: Self-pay | Admitting: Family

## 2014-12-07 ENCOUNTER — Encounter: Payer: Self-pay | Admitting: Family

## 2014-12-07 DIAGNOSIS — R197 Diarrhea, unspecified: Secondary | ICD-10-CM | POA: Diagnosis not present

## 2014-12-07 LAB — CBC WITH DIFFERENTIAL/PLATELET
BASOS ABS: 0 10*3/uL (ref 0.0–0.1)
BASOS PCT: 0.6 % (ref 0.0–3.0)
EOS ABS: 0.2 10*3/uL (ref 0.0–0.7)
Eosinophils Relative: 3.1 % (ref 0.0–5.0)
HEMATOCRIT: 37.5 % (ref 36.0–46.0)
HEMOGLOBIN: 12.1 g/dL (ref 12.0–15.0)
LYMPHS PCT: 16.8 % (ref 12.0–46.0)
Lymphs Abs: 1.3 10*3/uL (ref 0.7–4.0)
MCHC: 32.3 g/dL (ref 30.0–36.0)
MCV: 91.6 fl (ref 78.0–100.0)
MONOS PCT: 7.5 % (ref 3.0–12.0)
Monocytes Absolute: 0.6 10*3/uL (ref 0.1–1.0)
NEUTROS ABS: 5.6 10*3/uL (ref 1.4–7.7)
Neutrophils Relative %: 72 % (ref 43.0–77.0)
PLATELETS: 242 10*3/uL (ref 150.0–400.0)
RBC: 4.09 Mil/uL (ref 3.87–5.11)
RDW: 14.3 % (ref 11.5–15.5)
WBC: 7.8 10*3/uL (ref 4.0–10.5)

## 2014-12-07 LAB — COMPREHENSIVE METABOLIC PANEL
ALBUMIN: 4 g/dL (ref 3.5–5.2)
ALT: 15 U/L (ref 0–35)
AST: 16 U/L (ref 0–37)
Alkaline Phosphatase: 112 U/L (ref 39–117)
BILIRUBIN TOTAL: 0.5 mg/dL (ref 0.2–1.2)
BUN: 11 mg/dL (ref 6–23)
CALCIUM: 9.1 mg/dL (ref 8.4–10.5)
CO2: 30 meq/L (ref 19–32)
Chloride: 108 mEq/L (ref 96–112)
Creatinine, Ser: 0.77 mg/dL (ref 0.40–1.20)
GFR: 79.28 mL/min (ref >60.00)
Glucose, Bld: 104 mg/dL — ABNORMAL HIGH (ref 70–99)
Potassium: 4.1 mEq/L (ref 3.5–5.1)
Sodium: 145 mEq/L (ref 135–145)
Total Protein: 6.6 g/dL (ref 6.0–8.3)

## 2014-12-07 LAB — URINALYSIS, ROUTINE W REFLEX MICROSCOPIC
BILIRUBIN URINE: NEGATIVE
HGB URINE DIPSTICK: NEGATIVE
LEUKOCYTES UA: NEGATIVE
Nitrite: NEGATIVE
RBC / HPF: NONE SEEN (ref 0–?)
Specific Gravity, Urine: >=1.03 — AB (ref 1.000–1.030)
Total Protein, Urine: NEGATIVE
UROBILINOGEN UA: 0.2 (ref 0.0–1.0)
Urine Glucose: NEGATIVE
pH: 5.5 (ref 5.0–8.0)

## 2014-12-08 LAB — OVA AND PARASITE EXAMINATION: OP: NONE SEEN

## 2014-12-09 ENCOUNTER — Other Ambulatory Visit: Payer: Self-pay | Admitting: Family Medicine

## 2014-12-11 LAB — STOOL CULTURE

## 2014-12-21 NOTE — Telephone Encounter (Signed)
Please contact pt re: unread mychart message. 

## 2014-12-22 NOTE — Telephone Encounter (Signed)
Letter mailed to pt.  

## 2014-12-30 ENCOUNTER — Encounter: Payer: Self-pay | Admitting: Internal Medicine

## 2015-01-03 ENCOUNTER — Encounter: Payer: Self-pay | Admitting: Family

## 2015-01-07 ENCOUNTER — Other Ambulatory Visit: Payer: Self-pay | Admitting: Family Medicine

## 2015-01-10 ENCOUNTER — Ambulatory Visit: Payer: Medicare Other | Admitting: Internal Medicine

## 2015-01-10 ENCOUNTER — Encounter: Payer: Self-pay | Admitting: Internal Medicine

## 2015-01-10 VITALS — BP 114/76 | HR 57 | Ht 59.0 in | Wt 209.0 lb

## 2015-01-10 DIAGNOSIS — G4733 Obstructive sleep apnea (adult) (pediatric): Secondary | ICD-10-CM

## 2015-01-10 MED ORDER — AZELASTINE-FLUTICASONE 137-50 MCG/ACT NA SUSP: 1.0000 | Freq: Every day | NASAL | Status: DC

## 2015-01-10 NOTE — Progress Notes (Signed)
Subjective:    Patient ID: [name], female    DOB: 10/02/1945, 69 y.o.   MRN: [id number]  HPI 09/21/10- 69 year old female never smoker followed for allergic rhinitis, asthma, OSA, complicated by GERD, DM, HBP, obesity. Last here August 08, 2009 CPAP 12- Able to wear it all night, comfortable now. . Colostomy (colon nicked in a bladder sling procedure) was reversed. Had to revise the wound for staph and incidentally had a squamous cell skin cancer removed. Had iron infusion for anemia. No major respiratory problems through the past year. For the past 2-3 weeks has had burning and tearing of eyes with some postnasal drip, raspy throat.  Daily xyzal, patanase, singulair and ipratropium eye nasal spray..  Not aware of drafts into eye. Tried optivar, visine. Denies glaucoma.  08/02/11- 69 year old female never smoker followed for allergic rhinitis, asthma, OSA, complicated by GERD, DM, HBP, obesity. Still on vaccine, cough -productive-light yellow in color(finished Zpak today; both ears painful(full),and nasal drainage. Still using CPAP every night  and will get with DME for mask options. Bronchitis x5 days with dark yellow sputum. Color clearing with Z-Pak. Admits wheezing and frontal sinus pressure without headache, fever, sore throat or grossly purulent sputum.  11/07/12- 69 year old female never smoker followed for allergic rhinitis, asthma, OSA, complicated by GERD, DM, HBP, obesity. FOLLOWS FOR: still on allergy vaccine and no reactions; cough x 6 weeks(has had 2 rds of abx and prednisone)Finished the prednisone monday. NPSG 03/23/1999- AHI 156/ hr, weight 202lbs before bariatric surgery, CPAP recently 12. [facility] screen 07/24/12- AHI 2.0/ hr, weight 209lbs.  Bronchitis/sinusitis responded in July to a Z-Pak and prednisone from her primary physician. She then relapsed and a second round of Z-Pak and prednisone ended last week. Persistent dry hacking cough increased by  activity and temperature changes. She is using Advair Singulair and Tessalon. She says she can tolerate Tussionex but not other hydrocodone products.  12/25/12-69 year old female never smoker followed for allergic rhinitis, asthma, OSA, complicated by GERD, DM, HBP, obesity. Allergy vaccine 1:10 GO FOLLOWS FOR: still on vaccine and doing well; discuss ordering full NPSG Cough is finally better. Blames postnasal drip despite Patanase and ipratropium. No sinus pressure. Taking clonazepam 1 mg at bedtime for leg jerks. Ritalin only if can't stay awake at work. Always tired and thinks her CPAP is not working. Interested in update full sleep study to reevaluate. CXR 11/07/12 IMPRESSION:  No acute cardiopulmonary process. Left midlung atelectasis and or  scarring.  Electronically Signed  By: Lovey Newcomer M.D.  On: 11/07/2012 17:12  02/03/13-69 year old female never smoker followed for allergic rhinitis, asthma, OSA, complicated by GERD, DM, HBP, obesity. Allergy vaccine 1:10 GO FOLLOWS FOR: review sleep study and MSLT with patient; patient is having a cough since Friday-was tested for flu-neg. Acute bronchitis- Dr Randel Pigg yesterday gave neb, albuterol inhaler, Zpak. Prednisone taper was not started. OSA- has continued CPAP 13/[facility]. NPSG 01/25/13- AHI 7.6/ hr, mild OSA, weight 213 lbs. MSLT 01/26/13- mean latency 0.9 minutes, SOREM 1/5 naps. Pathologic daytime sleepiness consistent with narcolepsy or idiopathic, but nonspecific because she had taken Klonopin and Lamictal the night before, Cymbalta and Xyzal the day of the test. CXR 02/02/13 IMPRESSION:  Findings suggestive of bronchitis. No focal infiltrate to suggest  pneumonia identified.  Electronically Signed  By: Jeannine Boga M.D.  On: 02/02/2013 17:52  05/13/13- 68 yoF never smoker followed for allergic rhinitis, asthma, OSA/ Narcolepsy , complicated by GERD, DM, HBP, obesity/ pace,maker. OSA- has continued CPAP 13/[facility]  Allergy  vaccine 1:10 GO Presents for- runny nose, sinus congestion, vocal changes, right ear pain X5 days.  pt has an infant grandchild in the hospital, wants to be sure she is not contagious.   Cough started after being outside- pollen. No sick exposure. Eyes watering.  Yellow, frontal HA, cough hurts when she coughs. Hurts behind angle R jaw- eustachian distribution. She says she can tolerate tussionex occasionally, otherwise avoids narcotics.  07/15/13- 68 yoF never smoker followed for allergic rhinitis, asthma, OSA/ Narcolepsy , complicated by GERD, DM, HBP, obesity/ pacemaker. ACUTE VISIT:  C/O:  itching from head to toe over the past week.  Reports skin is raw from itching so much Generalized pruritus over the past week with no similar experience in the past. No visible rash. Does not recognize any change of exposure. Has not been outside much. Already on Zantac plus Allegra and continues allergy vaccine 1:10 GO CPAP 13/ [facility]- doing fine  01/10/14- 68 yoF never smoker followed for allergic rhinitis, asthma, OSA/ Narcolepsy , complicated by GERD, DM, HBP, obesity/ pacemaker. FOLLOWS FOR:  needs new rx for cpap 13/ [facility].  old is not fixable. has decided that she is allergic to advil.  still taking her allergy vaccine 1:10 GO.  Does well with trazodone 50 mg for sleep most nights, sometimes 100 mg. [facility] and needs prescription for replacement of her worn CPAP machine. She uses it all night every night and pressure seems good.  01/10/2015-69 year old female never smoker followed for allergic rhinitis, asthma, OSA/Narcolepsy, complicated by GERD, DM, HBP, obesity, pacemaker Allergy Vaccine 1:10 G0 CPAP 13/[facility] FOLLOWS FOR: Still on vaccine and no reactions; having hard time with clearing thraot and PND-take Sudafed and it goes away(does not take on regular basis due to BP issues). Continues to wear CPAP machine nightly through [facility](current DME).   Review of Systems-see HPI   Constitutional:   No-   weight loss, night sweats, fevers, chills, +fatigue, lassitude. HEENT:   No-  headaches, difficulty swallowing, tooth/dental problems, sore throat,       No-  sneezing, +itching, +ear ache, + nasal congestion, +post nasal drip,  CV:  No-   chest pain, orthopnea, PND, swelling in lower extremities, anasarca, dizziness, palpitations Resp: No-   shortness of breath with exertion or at rest.              No- productive cough,  + non-productive cough,  No-  coughing up of blood.              No-   change in color of mucus.  No- wheezing.   Skin: No-   rash or lesions. GI:  No-   heartburn, indigestion, abdominal pain, nausea, vomiting, GU:  MS:  No-   joint pain or swelling.   Neuro- nothing unusual Psych:  No- change in mood or affect. No depression or anxiety.  No memory loss.    Objective:   Physical Exam General- Alert, Oriented, Affect-appropriate, Distress- none acute  Very overweight Skin- rash-none, lesions- none, excoriation-none Lymphadenopathy- none Head- atraumatic            Eyes- Gross vision intact, PERRLA, conjunctivae clear, with watery thin secretions            Ears- Hearing, canals normal, TMs normal            Nose- Clear, No- Septal dev, mucus, polyps, erosion, perforation,             Throat- Mallampati III , mucosa  clear , drainage- none, tonsils- atrophic Neck- flexible , trachea midline, no stridor , thyroid nl, carotid no bruit Chest - symmetrical excursion , unlabored           Heart/CV- RRR , +trace systolic murmur? TI or AS , no gallop  , no rub, nl s1               s2                           - JVD- none , edema- none, stasis changes- none, varices- right calf           Lung-  Clear chest, Cough-none, wheeze- none , dullness-none, rub- none           Chest wall-  Abd-  Br/ Gen/ Rectal- Not done, not indicated Extrem- cyanosis- none, clubbing, none, atrophy- none, strength- nl Neuro- grossly intact to observation  Assessment &  Plan:

## 2015-01-10 NOTE — Patient Instructions (Signed)
Sample and printed script for Dymista nasal spray    1-2 puffs each nostril once daily at bedtime  We can continue allergy vaccine 1:10 GO another year  We can continue CPAP auto 5-20/ [facility]

## 2015-01-28 ENCOUNTER — Telehealth: Payer: Self-pay | Admitting: Internal Medicine

## 2015-01-28 MED ORDER — DOXYCYCLINE HYCLATE 100 MG PO TABS: ORAL_TABLET | ORAL | Status: DC

## 2015-01-28 NOTE — Telephone Encounter (Signed)
Spoke with pt. Reports dry cough and chest congestion. Onset was 4 days ago. Denies chest tightness, wheezing, SOB or fever at this time. Has been taking all allergy medications, Nyquil and Tessalon perles with no relief. Would like to have something called in. CY - please advise. Thanks.  Allergies  Allergen Reactions  . Hydrocodone      nausea and vomiting and headaches.  . Advil [Ibuprofen]     itching  . Cephalexin      Neuropathy  . Codeine     Crazy in the head, bp drops  . Levofloxacin Itching  . Meperidine Hcl     B/P drops  . Morphine And Related      States" extreme migraines"  . Oxycodone-Acetaminophen Other (See Comments)    Crazy in head, drop in bp  . Sulfonamide Derivatives Nausea And Vomiting    Stomach upset  . Adhesive [Tape] Rash    Latex in adhesive tape. Please use paper tape  . Erythromycin Diarrhea and Rash  . Penicillins Nausea And Vomiting and Rash   Current Outpatient Prescriptions on File Prior to Visit  Medication Sig Dispense Refill  . ALPRAZolam (XANAX) 0.25 MG tablet Take 0.25 mg by mouth daily as needed. For anxiety    . aspirin 81 MG tablet Take 81 mg by mouth daily.    [name] atorvastatin (LIPITOR) 10 MG tablet Take 10 mg by mouth daily.    . Azelastine-Fluticasone (DYMISTA) 137-50 MCG/ACT SUSP Place 1-2 puffs into the nose at bedtime. 1 Bottle prn  . bifidobacterium infantis (ALIGN) capsule Take 1 capsule by mouth daily.     . DULoxetine (CYMBALTA) 60 MG capsule TAKE ONE CAPSULE BY MOUTH ONCE DAILY 90 capsule 1  . Lactobacillus (PROBIOTIC ACIDOPHILUS PO) Take by mouth.    . methylphenidate (RITALIN) 10 MG tablet Take 10 mg by mouth 2 (two) times daily.    . montelukast (SINGULAIR) 10 MG tablet Take 1 tablet (10 mg total) by mouth at bedtime. (Patient not taking: Reported on 01/10/2015) 90 tablet 3  . NON FORMULARY CPAP- set on [facility] [employer].    . NONFORMULARY OR COMPOUNDED ITEM Allergy Vaccine 1:10 Given at Home    . omeprazole (PRILOSEC OTC) 20  MG tablet Take 20 mg by mouth daily.    . ranitidine (ZANTAC) 150 MG tablet 1 tablet qam    . traMADol (ULTRAM) 50 MG tablet Take 1 tablet (50 mg total) by mouth at bedtime as needed. 30 tablet 0  . traZODone (DESYREL) 100 MG tablet TAKE ONE-HALF TO ONE TABLET BY MOUTH ONCE DAILY FOR SLEEP AS DIRECTED 90 tablet 0  . VESICARE 10 MG tablet TAKE ONE TABLET BY MOUTH ONCE DAILY 30 tablet 5  . Vitamin D, Ergocalciferol, (DRISDOL) 50000 UNITS CAPS capsule Take 1 capsule (50,000 Units total) by mouth 2 (two) times a week. (Patient taking differently: Take 50,000 Units by mouth every 7 (seven) days. ) 20 capsule 2   No current facility-administered medications on file prior to visit.

## 2015-01-28 NOTE — Telephone Encounter (Signed)
Offer doxycycline 100 mg, # 8, 2 today then one daily  Hydration and Mucinex-DM may help

## 2015-01-28 NOTE — Telephone Encounter (Signed)
Pt is aware of CY's recommendation. Rx has been sent in. Nothing further was needed. 

## 2015-02-03 ENCOUNTER — Telehealth: Payer: Self-pay | Admitting: Family Medicine

## 2015-02-03 DIAGNOSIS — I10 Essential (primary) hypertension: Secondary | ICD-10-CM

## 2015-02-03 DIAGNOSIS — E213 Hyperparathyroidism, unspecified: Secondary | ICD-10-CM

## 2015-02-03 DIAGNOSIS — E785 Hyperlipidemia, unspecified: Secondary | ICD-10-CM

## 2015-02-03 DIAGNOSIS — E119 Type 2 diabetes mellitus without complications: Secondary | ICD-10-CM

## 2015-02-03 DIAGNOSIS — E559 Vitamin D deficiency, unspecified: Secondary | ICD-10-CM

## 2015-02-03 NOTE — Telephone Encounter (Signed)
Labs entered and patient aware and will go to the [facility] to have done.

## 2015-02-03 NOTE — Telephone Encounter (Signed)
Relation to [id number] Call back number:[phone] Pharmacy:  Reason for call:  Patient not sure if she needs pre visit labs prior to 02/22/15 appointment please advise

## 2015-02-03 NOTE — Telephone Encounter (Signed)
Labs vit D, cbc, iron level, cmp, tsh, lipid, hgba1c, microalb, PTH for hyerparathyroidism, vitamin D def, DM2 in obese, hyperlipidemia, mixed, HTN

## 2015-02-04 ENCOUNTER — Ambulatory Visit: Payer: Medicare Other | Admitting: Family Medicine

## 2015-02-04 ENCOUNTER — Other Ambulatory Visit (INDEPENDENT_AMBULATORY_CARE_PROVIDER_SITE_OTHER): Payer: Medicare Other

## 2015-02-04 DIAGNOSIS — E559 Vitamin D deficiency, unspecified: Secondary | ICD-10-CM

## 2015-02-04 DIAGNOSIS — E213 Hyperparathyroidism, unspecified: Secondary | ICD-10-CM | POA: Diagnosis not present

## 2015-02-04 DIAGNOSIS — E119 Type 2 diabetes mellitus without complications: Secondary | ICD-10-CM | POA: Diagnosis not present

## 2015-02-04 DIAGNOSIS — I10 Essential (primary) hypertension: Secondary | ICD-10-CM

## 2015-02-04 DIAGNOSIS — E785 Hyperlipidemia, unspecified: Secondary | ICD-10-CM

## 2015-02-04 LAB — LIPID PANEL
CHOLESTEROL: 125 mg/dL (ref 0–200)
HDL: 45.8 mg/dL (ref >39.00)
LDL Cholesterol: 63 mg/dL (ref 0–99)
NonHDL: 78.73
TRIGLYCERIDES: 78 mg/dL (ref 0.0–149.0)
Total CHOL/HDL Ratio: 3
VLDL: 15.6 mg/dL (ref 0.0–40.0)

## 2015-02-04 LAB — COMPREHENSIVE METABOLIC PANEL
ALBUMIN: 3.8 g/dL (ref 3.5–5.2)
ALT: 16 U/L (ref 0–35)
AST: 17 U/L (ref 0–37)
Alkaline Phosphatase: 99 U/L (ref 39–117)
BUN: 15 mg/dL (ref 6–23)
CALCIUM: 9 mg/dL (ref 8.4–10.5)
CHLORIDE: 106 meq/L (ref 96–112)
CO2: 28 mEq/L (ref 19–32)
Creatinine, Ser: 0.71 mg/dL (ref 0.40–1.20)
GFR: 87.01 mL/min (ref >60.00)
Glucose, Bld: 96 mg/dL (ref 70–99)
POTASSIUM: 3.7 meq/L (ref 3.5–5.1)
SODIUM: 144 meq/L (ref 135–145)
Total Bilirubin: 0.5 mg/dL (ref 0.2–1.2)
Total Protein: 6.5 g/dL (ref 6.0–8.3)

## 2015-02-04 LAB — IBC PANEL
Iron: 66 ug/dL (ref 42–145)
Saturation Ratios: 17.3 % — ABNORMAL LOW (ref 20.0–50.0)
Transferrin: 273 mg/dL (ref 212.0–360.0)

## 2015-02-04 LAB — VITAMIN D 25 HYDROXY (VIT D DEFICIENCY, FRACTURES): VITD: 21.73 ng/mL — ABNORMAL LOW (ref 30.00–100.00)

## 2015-02-04 LAB — TSH: TSH: 2.18 u[IU]/mL (ref 0.35–4.50)

## 2015-02-04 LAB — CBC WITH DIFFERENTIAL/PLATELET
BASOS ABS: 0 10*3/uL (ref 0.0–0.1)
BASOS PCT: 0.6 % (ref 0.0–3.0)
EOS ABS: 0.4 10*3/uL (ref 0.0–0.7)
Eosinophils Relative: 5.8 % — ABNORMAL HIGH (ref 0.0–5.0)
HCT: 38.1 % (ref 36.0–46.0)
HEMOGLOBIN: 12.4 g/dL (ref 12.0–15.0)
Lymphocytes Relative: 28.1 % (ref 12.0–46.0)
Lymphs Abs: 1.7 10*3/uL (ref 0.7–4.0)
MCHC: 32.6 g/dL (ref 30.0–36.0)
MCV: 90 fl (ref 78.0–100.0)
MONO ABS: 0.4 10*3/uL (ref 0.1–1.0)
Monocytes Relative: 7.1 % (ref 3.0–12.0)
Neutro Abs: 3.6 10*3/uL (ref 1.4–7.7)
Neutrophils Relative %: 58.4 % (ref 43.0–77.0)
PLATELETS: 260 10*3/uL (ref 150.0–400.0)
RBC: 4.23 Mil/uL (ref 3.87–5.11)
RDW: 14.4 % (ref 11.5–15.5)
WBC: 6.2 10*3/uL (ref 4.0–10.5)

## 2015-02-04 LAB — MICROALBUMIN / CREATININE URINE RATIO
CREATININE, U: 111.8 mg/dL
MICROALB UR: 0.7 mg/dL (ref 0.0–1.9)
Microalb Creat Ratio: 0.6 mg/g (ref 0.0–30.0)

## 2015-02-04 LAB — HEMOGLOBIN A1C: HEMOGLOBIN A1C: 5.8 % (ref 4.6–6.5)

## 2015-02-07 ENCOUNTER — Other Ambulatory Visit: Payer: Self-pay | Admitting: Family Medicine

## 2015-02-07 MED ORDER — VITAMIN D3 1.25 MG (50000 UT) PO CAPS: 2.0000 | ORAL_CAPSULE | ORAL | Status: DC

## 2015-02-08 LAB — PTH, INTACT AND CALCIUM
CALCIUM: 8.9 mg/dL (ref 8.4–10.5)
PTH: 95 pg/mL — AB (ref 14–64)

## 2015-02-22 ENCOUNTER — Encounter: Payer: Self-pay | Admitting: Family Medicine

## 2015-02-22 ENCOUNTER — Ambulatory Visit (INDEPENDENT_AMBULATORY_CARE_PROVIDER_SITE_OTHER): Payer: Medicare Other | Admitting: Family Medicine

## 2015-02-22 VITALS — BP 140/78 | HR 62 | Temp 99.2°F | Ht 59.0 in | Wt 211.0 lb

## 2015-02-22 DIAGNOSIS — I10 Essential (primary) hypertension: Secondary | ICD-10-CM

## 2015-02-22 DIAGNOSIS — E119 Type 2 diabetes mellitus without complications: Secondary | ICD-10-CM | POA: Diagnosis not present

## 2015-02-22 DIAGNOSIS — E782 Mixed hyperlipidemia: Secondary | ICD-10-CM | POA: Diagnosis not present

## 2015-02-22 DIAGNOSIS — G4733 Obstructive sleep apnea (adult) (pediatric): Secondary | ICD-10-CM

## 2015-02-22 DIAGNOSIS — J329 Chronic sinusitis, unspecified: Secondary | ICD-10-CM | POA: Diagnosis not present

## 2015-02-22 DIAGNOSIS — E1169 Type 2 diabetes mellitus with other specified complication: Secondary | ICD-10-CM

## 2015-02-22 DIAGNOSIS — E669 Obesity, unspecified: Secondary | ICD-10-CM | POA: Diagnosis not present

## 2015-02-22 MED ORDER — BENZONATATE 100 MG PO CAPS: 100.0000 mg | ORAL_CAPSULE | Freq: Three times a day (TID) | ORAL | Status: DC | PRN

## 2015-02-22 MED ORDER — METHYLPHENIDATE HCL 10 MG PO TABS: 10.0000 mg | ORAL_TABLET | Freq: Two times a day (BID) | ORAL | Status: DC

## 2015-02-22 MED ORDER — DOXYCYCLINE HYCLATE 100 MG PO CAPS
100.0000 mg | ORAL_CAPSULE | Freq: Two times a day (BID) | ORAL | Status: AC
Start: 2015-02-22 — End: 2015-03-08

## 2015-02-22 MED ORDER — FLUTICASONE PROPIONATE 50 MCG/ACT NA SUSP: 2.0000 | Freq: Every day | NASAL | Status: DC

## 2015-02-22 MED ORDER — AZELASTINE HCL 0.1 % NA SOLN: 2.0000 | Freq: Two times a day (BID) | NASAL | Status: DC

## 2015-02-22 MED ORDER — TRAMADOL HCL 50 MG PO TABS: 50.0000 mg | ORAL_TABLET | Freq: Three times a day (TID) | ORAL | Status: DC | PRN

## 2015-02-22 NOTE — Progress Notes (Signed)
Subjective:    Patient ID: [name], female    DOB: Jul 16, 1945, 70 y.o.   MRN: [id number]  Chief Complaint  Patient presents with  . Follow-up    HPI Patient is in today for follow up. Continues to struggle with diffuse chronic pain, malaise and myalgias. Is noting congestion and PND. Using CPAP most days. Acknowledges anhedonia but denies suicidal ideation. Denies CP/palp/SOB/HA/fevers/GI or GU c/o. Taking meds as prescribed  Past Medical History  Diagnosis Date  . Obesity   . Esophageal reflux   . Fibromyalgia   . Asthma   . Allergy     rhinitis  . Sleep apnea, obstructive   . Bronchitis, acute 10/31/2010  . Otitis media of both ears 11/16/2010  . Depression with anxiety 01/08/2011  . Back pain 01/08/2011  . Sinusitis 06/11/2011  . Fatigue 06/11/2011  . Knee pain, right 09/20/2011  . Otitis media 09/20/2011  . Varicose veins of lower limb 10/02/2011  . Otitis media of left ear 09/19/2010  . Motion sickness 12/24/2011  . Sinusitis 12/24/2011  . Nausea & vomiting 12/24/2011  . Heart murmur   . Panic attacks   . Arthritis 02-01-12    osteoarthritis, Back pain, spine surgeries ? retain hardware cervial and  lumbar.  . Anemia 02-01-12    iron absorption problem  . Cancer ([place]) 02-01-12    squamous cell -face  . Allergic state 09/21/2010  . Osteopenia 08/05/2014  . Hyperparathyroidism ([place]) 08/18/2014  . Abdominal pain 08/18/2014    Past Surgical History  Procedure Laterality Date  . Bariatric surgery    . Lumbar disc surgery    . Bladder suspension      complicated by bowel nick/ clolostomy  . Gallbladder surgery  1978  . Appendectomy  1962  . Gastric bypass  2003    Patient also noted "gastric bypass resection - 2004"  . Cervical disc surgery      C-7 in 2004, C-4 and C-5 in 2010  . Colostomy bag      Confirm with patient. Listed under medical conditions on form dated 07/18/09.  [name]      Per medical history form dated 07/18/09.  [name] Hernia repair  2009   Two hernias and abdominal reconstruction  . Ivc filter      prophyllactically- no hx DVT  . Carpal tunnel release  02-01-12    right  . Colostomy reversal  02-01-12  . Abdominal surgery  02-01-12    ABDOMINAL SURGERY  . Vulva surgery  02-01-12    cyst removal-pt 8 months pregnant  . Total knee arthroplasty  02/11/2012    Procedure: TOTAL KNEE ARTHROPLASTY;  Surgeon: Mauri Pole, MD;  Location: [facility];  Service: Orthopedics;  Laterality: Right;    Family History  Problem Relation Age of Onset  . Alcohol abuse Mother   . Other Mother     accidental med overdose  . Emphysema Mother     smoked  . Bipolar disorder Mother   . Other Father     CHF  . Diabetes Father   . Cancer Father     throat ca/ smoked  . Alcohol abuse Father   . Stroke Father   . Hypertension Brother   . Hyperlipidemia Brother   . Obesity Brother   . Heart attack Maternal Grandfather   . Heart attack Paternal Grandmother   . Heart attack Paternal Grandfather   . Arthritis Son     psoriatic  . Arthritis  Son     psoriatic  . Obesity Son     Social History   Social History  . Marital Status: Married    Spouse Name: N/A  . Number of Children: N/A  . Years of Education: N/A   Occupational History  . Not on file.   Social History Main Topics  . Smoking status: Never Smoker   . Smokeless tobacco: Never Used  . Alcohol Use: No     Comment: special occasions  . Drug Use: No  . Sexual Activity: Yes   Other Topics Concern  . Not on file   Social History Narrative    Outpatient Prescriptions Prior to Visit  Medication Sig Dispense Refill  . ALPRAZolam (XANAX) 0.25 MG tablet Take 0.25 mg by mouth daily as needed. For anxiety    . aspirin 81 MG tablet Take 81 mg by mouth daily.    [name] atorvastatin (LIPITOR) 10 MG tablet Take 10 mg by mouth daily.    . Azelastine-Fluticasone (DYMISTA) 137-50 MCG/ACT SUSP Place 1-2 puffs into the nose at bedtime. 1 Bottle prn  . bifidobacterium infantis (ALIGN)  capsule Take 1 capsule by mouth daily.     . Cholecalciferol (VITAMIN D3) 50000 units CAPS Take 2 capsules by mouth once a week. 8 capsule 4  . doxycycline (VIBRA-TABS) 100 MG tablet Take 2 tablets today, then 1 tablet daily until gone 8 tablet 0  . DULoxetine (CYMBALTA) 60 MG capsule TAKE ONE CAPSULE BY MOUTH ONCE DAILY 90 capsule 1  . Lactobacillus (PROBIOTIC ACIDOPHILUS PO) Take by mouth.    . montelukast (SINGULAIR) 10 MG tablet Take 1 tablet (10 mg total) by mouth at bedtime. 90 tablet 3  . NON FORMULARY CPAP- set on 12 [employer].    . NONFORMULARY OR COMPOUNDED ITEM Allergy Vaccine 1:10 Given at Home    . omeprazole (PRILOSEC OTC) 20 MG tablet Take 20 mg by mouth daily.    . ranitidine (ZANTAC) 150 MG tablet 1 tablet qam    . traMADol (ULTRAM) 50 MG tablet Take 1 tablet (50 mg total) by mouth at bedtime as needed. 30 tablet 0  . traZODone (DESYREL) 100 MG tablet TAKE ONE-HALF TO ONE TABLET BY MOUTH ONCE DAILY FOR SLEEP AS DIRECTED 90 tablet 0  . VESICARE 10 MG tablet TAKE ONE TABLET BY MOUTH ONCE DAILY 30 tablet 5  . Vitamin D, Ergocalciferol, (DRISDOL) 50000 UNITS CAPS capsule Take 1 capsule (50,000 Units total) by mouth 2 (two) times a week. (Patient taking differently: Take 50,000 Units by mouth every 7 (seven) days. ) 20 capsule 2  . methylphenidate (RITALIN) 10 MG tablet Take 10 mg by mouth 2 (two) times daily.     No facility-administered medications prior to visit.    Allergies  Allergen Reactions  . Hydrocodone      nausea and vomiting and headaches.  . Advil [Ibuprofen]     itching  . Cephalexin      Neuropathy  . Codeine     Crazy in the head, bp drops  . Levofloxacin Itching  . Meperidine Hcl     B/P drops  . Morphine And Related      States" extreme migraines"  . Oxycodone-Acetaminophen Other (See Comments)    Crazy in head, drop in bp  . Sulfonamide Derivatives Nausea And Vomiting    Stomach upset  . Adhesive [Tape] Rash    Latex in adhesive tape. Please use  paper tape  . Erythromycin Diarrhea and Rash  . Penicillins  Nausea And Vomiting and Rash    Review of Systems  Constitutional: Positive for malaise/fatigue.  Gastrointestinal: Positive for abdominal pain. Negative for nausea, vomiting, diarrhea and constipation.  Musculoskeletal: Positive for myalgias, back pain, joint pain and neck pain.  Neurological: Positive for tremors.       Radiating nerve pain that travels down the leg  Psychiatric/Behavioral: Positive for depression. The patient is nervous/anxious.        Objective:    Physical Exam  Constitutional: She is oriented to person, place, and time. She appears well-developed and well-nourished. No distress.  HENT:  Head: Normocephalic and atraumatic.  Nose: Nose normal.  Eyes: Right eye exhibits no discharge. Left eye exhibits no discharge.  Neck: Normal range of motion. Neck supple.  Cardiovascular: Normal rate and regular rhythm.   Murmur heard. Pulmonary/Chest: Effort normal and breath sounds normal.  Abdominal: Soft. Bowel sounds are normal. There is no tenderness.  Musculoskeletal: She exhibits no edema.  Neurological: She is alert and oriented to person, place, and time.  Skin: Skin is warm and dry.  Psychiatric: She has a normal mood and affect.  Nursing note and vitals reviewed.   BP 140/78 mmHg  Pulse 62  Temp(Src) 99.2 F (37.3 C) (Oral)  Ht 4\' 11"  (1.499 m)  Wt 211 lb (95.709 kg)  BMI 42.59 kg/m2  SpO2 98% Wt Readings from Last 3 Encounters:  02/22/15 211 lb (95.709 kg)  01/10/15 209 lb (94.802 kg)  12/06/14 208 lb 6.4 oz (94.53 kg)     Lab Results  Component Value Date   WBC 6.2 02/04/2015   HGB 12.4 02/04/2015   HCT 38.1 02/04/2015   PLT 260.0 02/04/2015   GLUCOSE 96 02/04/2015   CHOL 125 02/04/2015   TRIG 78.0 02/04/2015   HDL 45.80 02/04/2015   LDLCALC 63 02/04/2015   ALT 16 02/04/2015   AST 17 02/04/2015   NA 144 02/04/2015   K 3.7 02/04/2015   CL 106 02/04/2015   CREATININE 0.71  02/04/2015   BUN 15 02/04/2015   CO2 28 02/04/2015   TSH 2.18 02/04/2015   INR 0.98 02/01/2012   HGBA1C 5.8 02/04/2015   MICROALBUR 0.7 02/04/2015    Lab Results  Component Value Date   TSH 2.18 02/04/2015   Lab Results  Component Value Date   WBC 6.2 02/04/2015   HGB 12.4 02/04/2015   HCT 38.1 02/04/2015   MCV 90.0 02/04/2015   PLT 260.0 02/04/2015   Lab Results  Component Value Date   NA 144 02/04/2015   K 3.7 02/04/2015   CO2 28 02/04/2015   GLUCOSE 96 02/04/2015   BUN 15 02/04/2015   CREATININE 0.71 02/04/2015   BILITOT 0.5 02/04/2015   ALKPHOS 99 02/04/2015   AST 17 02/04/2015   ALT 16 02/04/2015   PROT 6.5 02/04/2015   ALBUMIN 3.8 02/04/2015   CALCIUM 9.0 02/04/2015   CALCIUM 8.9 02/04/2015   GFR 87.01 02/04/2015   Lab Results  Component Value Date   CHOL 125 02/04/2015   Lab Results  Component Value Date   HDL 45.80 02/04/2015   Lab Results  Component Value Date   LDLCALC 63 02/04/2015   Lab Results  Component Value Date   TRIG 78.0 02/04/2015   Lab Results  Component Value Date   CHOLHDL 3 02/04/2015   Lab Results  Component Value Date   HGBA1C 5.8 02/04/2015       Assessment & Plan:   Problem List Items Addressed This Visit  Diabetes mellitus type 2 in obese (HCC)    hgba1c acceptable, minimize simple carbs. Increase exercise as tolerated. Continue current meds      ESSENTIAL HYPERTENSION, BENIGN    Well controlled, no changes to meds. Encouraged heart healthy diet such as the DASH diet and exercise as tolerated.       Hyperlipidemia, mixed    Tolerating statin, encouraged heart healthy diet, avoid trans fats, minimize simple carbs and saturated fats. Increase exercise as tolerated      Obesity    Encouraged DASH diet, decrease po intake and increase exercise as tolerated. Needs 7-8 hours of sleep nightly. Avoid trans fats, eat small, frequent meals every 4-5 hours with lean proteins, complex carbs and healthy fats. Minimize  simple carbs      Relevant Medications   methylphenidate (RITALIN) 10 MG tablet   Obstructive sleep apnea    Using CPAP, follows with pulmonology       Other Visit Diagnoses    Chronic sinusitis, unspecified location    -  Primary    Relevant Medications    fluticasone (FLONASE) 50 MCG/ACT nasal spray    azelastine (ASTELIN) 0.1 % nasal spray    doxycycline (VIBRAMYCIN) 100 MG capsule    benzonatate (TESSALON) 100 MG capsule       I have changed [name]'s methylphenidate. I am also having her start on fluticasone, azelastine, doxycycline, benzonatate, and traMADol. Additionally, I am having her maintain her bifidobacterium infantis, NON FORMULARY, ALPRAZolam, atorvastatin, aspirin, ranitidine, omeprazole, traMADol, montelukast, NONFORMULARY OR COMPOUNDED ITEM, VESICARE, Vitamin D (Ergocalciferol), DULoxetine, traZODone, Lactobacillus (PROBIOTIC ACIDOPHILUS PO), Azelastine-Fluticasone, doxycycline, and Vitamin D3.  Meds ordered this encounter  Medications  . methylphenidate (RITALIN) 10 MG tablet    Sig: Take 1 tablet (10 mg total) by mouth 2 (two) times daily.    Dispense:  60 tablet    Refill:  0  . fluticasone (FLONASE) 50 MCG/ACT nasal spray    Sig: Place 2 sprays into both nostrils daily.    Dispense:  16 g    Refill:  3  . azelastine (ASTELIN) 0.1 % nasal spray    Sig: Place 2 sprays into both nostrils 2 (two) times daily. Use in each nostril as directed    Dispense:  30 mL    Refill:  3  . doxycycline (VIBRAMYCIN) 100 MG capsule    Sig: Take 1 capsule (100 mg total) by mouth 2 (two) times daily.    Dispense:  28 capsule    Refill:  0  . benzonatate (TESSALON) 100 MG capsule    Sig: Take 1 capsule (100 mg total) by mouth 3 (three) times daily as needed for cough.    Dispense:  60 capsule    Refill:  2  . traMADol (ULTRAM) 50 MG tablet    Sig: Take 1 tablet (50 mg total) by mouth 3 (three) times daily as needed.    Dispense:  90 tablet    Refill:  0      Willette Alma, MD

## 2015-02-22 NOTE — Progress Notes (Signed)
Pre visit review using our clinic review tool, if applicable. No additional management support is needed unless otherwise documented below in the visit note. 

## 2015-02-22 NOTE — Patient Instructions (Addendum)

## 2015-02-24 DIAGNOSIS — L821 Other seborrheic keratosis: Secondary | ICD-10-CM | POA: Diagnosis not present

## 2015-02-24 DIAGNOSIS — Z85828 Personal history of other malignant neoplasm of skin: Secondary | ICD-10-CM | POA: Diagnosis not present

## 2015-02-24 DIAGNOSIS — D1800 Hemangioma unspecified site: Secondary | ICD-10-CM | POA: Diagnosis not present

## 2015-02-24 DIAGNOSIS — L814 Other melanin hyperpigmentation: Secondary | ICD-10-CM | POA: Diagnosis not present

## 2015-02-24 DIAGNOSIS — Z23 Encounter for immunization: Secondary | ICD-10-CM | POA: Diagnosis not present

## 2015-02-24 DIAGNOSIS — L570 Actinic keratosis: Secondary | ICD-10-CM | POA: Diagnosis not present

## 2015-02-24 DIAGNOSIS — L820 Inflamed seborrheic keratosis: Secondary | ICD-10-CM | POA: Diagnosis not present

## 2015-02-24 DIAGNOSIS — D225 Melanocytic nevi of trunk: Secondary | ICD-10-CM | POA: Diagnosis not present

## 2015-02-27 ENCOUNTER — Encounter: Payer: Self-pay | Admitting: Family Medicine

## 2015-02-27 NOTE — Assessment & Plan Note (Signed)
Using CPAP, follows with pulmonology

## 2015-02-27 NOTE — Assessment & Plan Note (Signed)
Encouraged DASH diet, decrease po intake and increase exercise as tolerated. Needs 7-8 hours of sleep nightly. Avoid trans fats, eat small, frequent meals every 4-5 hours with lean proteins, complex carbs and healthy fats. Minimize simple carbs 

## 2015-02-27 NOTE — Assessment & Plan Note (Signed)
Continues to struggle with chronic pain uses meds prn and continues to work, encouraged to stay as active as possible

## 2015-02-27 NOTE — Assessment & Plan Note (Signed)
Well controlled, no changes to meds. Encouraged heart healthy diet such as the DASH diet and exercise as tolerated.  °

## 2015-02-27 NOTE — Assessment & Plan Note (Signed)
Tolerating statin, encouraged heart healthy diet, avoid trans fats, minimize simple carbs and saturated fats. Increase exercise as tolerated 

## 2015-02-27 NOTE — Assessment & Plan Note (Signed)
hgba1c acceptable, minimize simple carbs. Increase exercise as tolerated. Continue current meds 

## 2015-03-07 ENCOUNTER — Telehealth: Payer: Self-pay | Admitting: Family Medicine

## 2015-03-07 NOTE — Telephone Encounter (Signed)
Caller name: Self  Can be reached: [phone]   Reason for call: Patient states that pain medication she was given has not worked and she would like to move forward with an X-Ray

## 2015-03-08 ENCOUNTER — Other Ambulatory Visit: Payer: Self-pay | Admitting: Family Medicine

## 2015-03-08 DIAGNOSIS — M5441 Lumbago with sciatica, right side: Secondary | ICD-10-CM

## 2015-03-08 NOTE — Telephone Encounter (Signed)
Patient states pain is  at the base of spine and around right butt to around top of right leg, down right leg on both sides. She states it feels more like nerve pain.  She states the pain overlaps so is hard to explain where/what it is.  Tramadol has not worked.  She tried to do advil regularly and started gettting hives.  She is unable to drive, if in the bed on her back unable to turn over due to pain.

## 2015-03-08 NOTE — Telephone Encounter (Signed)
Right hip pain? Or is there more pain in low back? Etc? So I can order the xray

## 2015-03-08 NOTE — Telephone Encounter (Signed)
xrays ordered please let her know. She can try lidocaine patches, [employer] makes them now.

## 2015-03-09 ENCOUNTER — Ambulatory Visit (INDEPENDENT_AMBULATORY_CARE_PROVIDER_SITE_OTHER)
Admission: RE | Admit: 2015-03-09 | Discharge: 2015-03-09 | Disposition: A | Discharge status: Home / Self Care | Payer: Medicare Other | Source: Ambulatory Visit | Attending: Family Medicine | Admitting: Family Medicine

## 2015-03-09 DIAGNOSIS — M5441 Lumbago with sciatica, right side: Secondary | ICD-10-CM | POA: Diagnosis not present

## 2015-03-09 DIAGNOSIS — M4326 Fusion of spine, lumbar region: Secondary | ICD-10-CM | POA: Diagnosis not present

## 2015-03-09 NOTE — Telephone Encounter (Signed)
Pt notified that she can get the xrays completed at her convenience. Pt expressed an understanding.

## 2015-03-10 ENCOUNTER — Other Ambulatory Visit: Payer: Self-pay | Admitting: Family Medicine

## 2015-03-10 DIAGNOSIS — M5441 Lumbago with sciatica, right side: Secondary | ICD-10-CM

## 2015-03-10 NOTE — Telephone Encounter (Signed)
Xray at [facility] called to inform hip xray was entered incorrectly and due to that it did not show up in their work cue to do. It should be DG hip unilateral hip (or bilateral) with or without pelvis.  Let me know what to order I can do and let patient know to return if that is what you want.

## 2015-03-12 ENCOUNTER — Ambulatory Visit (HOSPITAL_BASED_OUTPATIENT_CLINIC_OR_DEPARTMENT_OTHER)
Admission: RE | Admit: 2015-03-12 | Discharge: 2015-03-12 | Disposition: A | Discharge status: Home / Self Care | Payer: Medicare Other | Source: Ambulatory Visit | Attending: Family Medicine | Admitting: Family Medicine

## 2015-03-12 DIAGNOSIS — Z981 Arthrodesis status: Secondary | ICD-10-CM | POA: Diagnosis not present

## 2015-03-12 DIAGNOSIS — M4806 Spinal stenosis, lumbar region: Secondary | ICD-10-CM | POA: Diagnosis not present

## 2015-03-12 DIAGNOSIS — M545 Low back pain: Secondary | ICD-10-CM | POA: Diagnosis present

## 2015-03-12 DIAGNOSIS — M5441 Lumbago with sciatica, right side: Secondary | ICD-10-CM | POA: Insufficient documentation

## 2015-03-12 DIAGNOSIS — M25551 Pain in right hip: Secondary | ICD-10-CM | POA: Diagnosis not present

## 2015-03-14 ENCOUNTER — Other Ambulatory Visit: Payer: Self-pay

## 2015-03-14 DIAGNOSIS — M5136 Other intervertebral disc degeneration, lumbar region: Secondary | ICD-10-CM

## 2015-03-14 DIAGNOSIS — M5126 Other intervertebral disc displacement, lumbar region: Secondary | ICD-10-CM

## 2015-03-14 DIAGNOSIS — M48061 Spinal stenosis, lumbar region without neurogenic claudication: Secondary | ICD-10-CM

## 2015-03-14 DIAGNOSIS — M4800 Spinal stenosis, site unspecified: Secondary | ICD-10-CM

## 2015-03-17 ENCOUNTER — Encounter: Payer: Self-pay | Admitting: Internal Medicine

## 2015-03-23 ENCOUNTER — Ambulatory Visit (INDEPENDENT_AMBULATORY_CARE_PROVIDER_SITE_OTHER): Payer: Medicare Other | Admitting: Internal Medicine

## 2015-03-23 ENCOUNTER — Encounter: Payer: Self-pay | Admitting: Internal Medicine

## 2015-03-23 VITALS — BP 104/62 | HR 70 | Temp 98.6°F | Resp 12 | Wt 209.6 lb

## 2015-03-23 DIAGNOSIS — E213 Hyperparathyroidism, unspecified: Secondary | ICD-10-CM | POA: Diagnosis not present

## 2015-03-23 DIAGNOSIS — E559 Vitamin D deficiency, unspecified: Secondary | ICD-10-CM

## 2015-03-23 NOTE — Patient Instructions (Signed)
Please come back for a vitamin D re-check in 1 month.  Continue Cholecalciferol 100,000 units weekly.

## 2015-03-23 NOTE — Progress Notes (Signed)
Patient ID: [name], female   DOB: 1946/01/17, 70 y.o.   MRN: [id number]   HPI  [name] is a 70 y.o.-year-old female, returning for follow-up for (likely secondary) hyperparathyroidism with normo/hypo-calcemia and vitamin D deficiency. Last visit 5 mo ago.  Pt has a h/o GBP - biliopancreatic diversion, 2003. She had reintervention - small bowel extension. She has malabsorption >> low calcium, very low ferritin >> IV iron infusion.   Reviewed history: Pt was dx with hyperparathyroidism in 07/2014. She has a h/o low-normal calcium level. I reviewed pt's pertinent labs: Lab Results  Component Value Date   PTH 95* 02/04/2015   PTH 47 11/10/2014   PTH Comment 11/10/2014   PTH 108* 08/05/2014   CALCIUM 9.0 02/04/2015   CALCIUM 8.9 02/04/2015   CALCIUM 9.1 12/06/2014   CALCIUM 8.5* 11/10/2014   CALCIUM 9.0 08/05/2014   CALCIUM 8.7 08/05/2014   CALCIUM 8.8 02/16/2014   CALCIUM 8.7 06/01/2013   CALCIUM 8.9 11/28/2012   CALCIUM 9.3 04/16/2012   I reviewed pt's DEXA scans: Date L1-L3 T score FN T score 33% distal Radius  05/30/2012 -1.5 -2.3 (L)    No fractures or falls.   No h/o kidney stones.  No h/o CKD. Last BUN/Cr: Lab Results  Component Value Date   BUN 15 02/04/2015   CREATININE 0.71 02/04/2015   Pt is not on HCTZ.  She has a  h/o vitamin D deficiency. At last visit, she mentioned that she was on Calcitriol "for years" >> stopped 1.5 years ago. Cannot tolerate MVI or calcium/iron >> nausea, stomach cramps.  At last visit, we checked her vitamin D level, and this was very low: Component     Latest Ref Rng 08/05/2014  VITD     30.00 - 100.00 ng/mL 9.76 (L)  She was started on Ergocalciferol 50,000 IU by PCP right before the test - she continue this for 12 weeks. She did have nausea and AP, but was able to take it. After this, she switched to vitamin D over-the-counter 2000 units, but then switched back to ergocalciferol once a week Of note, she cannot eat dairy  >> lactose intolerant.  Her vitamin D continued to improve - next level was 15, after which the dose of ergocalciferol was increased to twice a week. On this dose, vitamin D improved to 21. Calcitriol was slightly above the upper limit of normal: She was then switched to vitamin D3 50,000 units weekly.  Component     Latest Ref Rng 11/10/2014 02/04/2015  Vitamin D 1, 25 (OH) Total     18 - 72 pg/mL 78 (H)   Vitamin D3 1, 25 (OH)      27   Vitamin D2 1, 25 (OH)      51   VITD     30.00 - 100.00 ng/mL 15.12 (L) 21.73 (L)   Pt does not have a FH of hypercalcemia, kidney stones, or osteoporosis.   She has spinal stenosis and degenerative arthritis.   ROS: Constitutional: + Fatigue, no weight gain, no heat/cold intolerance Eyes: + blurry vision, no xerophthalmia ENT: no sore throat, no nodules palpated in throat, no dysphagia/odynophagia, no hoarseness Cardiovascular: no CP/SOB/palpitations/leg swelling Respiratory: no cough/SOB Gastrointestinal: no N/V/D/C/heartburn Musculoskeletal: no muscle aches/+ joint aches Skin: no rashes Neurological: + tremors/no numbness/tingling/dizziness  I reviewed pt's medications, allergies, PMH, social hx, family hx, and changes were documented in the history of present illness. Otherwise, unchanged from my initial visit note.  Past Medical  History  Diagnosis Date  . Obesity   . Esophageal reflux   . Fibromyalgia   . Asthma   . Allergy     rhinitis  . Sleep apnea, obstructive   . Bronchitis, acute 10/31/2010  . Otitis media of both ears 11/16/2010  . Depression with anxiety 01/08/2011  . Back pain 01/08/2011  . Sinusitis 06/11/2011  . Fatigue 06/11/2011  . Knee pain, right 09/20/2011  . Otitis media 09/20/2011  . Varicose veins of lower limb 10/02/2011  . Otitis media of left ear 09/19/2010  . Motion sickness 12/24/2011  . Sinusitis 12/24/2011  . Nausea & vomiting 12/24/2011  . Heart murmur   . Panic attacks   . Arthritis 02-01-12     osteoarthritis, Back pain, spine surgeries ? retain hardware cervial and  lumbar.  . Anemia 02-01-12    iron absorption problem  . Cancer ([place]) 02-01-12    squamous cell -face  . Allergic state 09/21/2010  . Osteopenia 08/05/2014  . Hyperparathyroidism ([place]) 08/18/2014  . Abdominal pain 08/18/2014   Past Surgical History  Procedure Laterality Date  . Bariatric surgery    . Lumbar disc surgery    . Bladder suspension      complicated by bowel nick/ clolostomy  . Gallbladder surgery  1978  . Appendectomy  1962  . Gastric bypass  2003    Patient also noted "gastric bypass resection - 2004"  . Cervical disc surgery      C-7 in 2004, C-4 and C-5 in 2010  . Colostomy bag      Confirm with patient. Listed under medical conditions on form dated 07/18/09.  [name]      Per medical history form dated 07/18/09.  [name] Hernia repair  2009    Two hernias and abdominal reconstruction  . Ivc filter      prophyllactically- no hx DVT  . Carpal tunnel release  02-01-12    right  . Colostomy reversal  02-01-12  . Abdominal surgery  02-01-12    ABDOMINAL SURGERY  . Vulva surgery  02-01-12    cyst removal-pt 8 months pregnant  . Total knee arthroplasty  02/11/2012    Procedure: TOTAL KNEE ARTHROPLASTY;  Surgeon: Mauri Pole, MD;  Location: [facility];  Service: Orthopedics;  Laterality: Right;   Social History   Social History  . Marital Status: Married    Spouse Name: N/A  . Number of Children: 3   Occupational History  .  Development worker, [employer]   Social History Main Topics  . Smoking status: Never Smoker   . Smokeless tobacco: Never Used  . Alcohol Use: No     Comment: special occasions  . Drug Use: No   Current Outpatient Prescriptions on File Prior to Visit  Medication Sig Dispense Refill  . ALPRAZolam (XANAX) 0.25 MG tablet Take 0.25 mg by mouth daily as needed. For anxiety    . aspirin 81 MG tablet Take 81 mg by mouth daily.    [name] atorvastatin (LIPITOR) 10 MG tablet Take 10 mg  by mouth daily.    [name] azelastine (ASTELIN) 0.1 % nasal spray Place 2 sprays into both nostrils 2 (two) times daily. Use in each nostril as directed 30 mL 3  . Azelastine-Fluticasone (DYMISTA) 137-50 MCG/ACT SUSP Place 1-2 puffs into the nose at bedtime. 1 Bottle prn  . benzonatate (TESSALON) 100 MG capsule Take 1 capsule (100 mg total) by mouth 3 (three) times daily as needed for cough. 60 capsule 2  .  bifidobacterium infantis (ALIGN) capsule Take 1 capsule by mouth daily.     . Cholecalciferol (VITAMIN D3) 50000 units CAPS Take 2 capsules by mouth once a week. 8 capsule 4  . doxycycline (VIBRA-TABS) 100 MG tablet Take 2 tablets today, then 1 tablet daily until gone 8 tablet 0  . DULoxetine (CYMBALTA) 60 MG capsule TAKE ONE CAPSULE BY MOUTH ONCE DAILY 90 capsule 1  . fluticasone (FLONASE) 50 MCG/ACT nasal spray Place 2 sprays into both nostrils daily. 16 g 3  . Lactobacillus (PROBIOTIC ACIDOPHILUS PO) Take by mouth.    . methylphenidate (RITALIN) 10 MG tablet Take 1 tablet (10 mg total) by mouth 2 (two) times daily. 60 tablet 0  . montelukast (SINGULAIR) 10 MG tablet Take 1 tablet (10 mg total) by mouth at bedtime. 90 tablet 3  . NON FORMULARY CPAP- set on 12 [employer].    . NONFORMULARY OR COMPOUNDED ITEM Allergy Vaccine 1:10 Given at Home    . omeprazole (PRILOSEC OTC) 20 MG tablet Take 20 mg by mouth daily.    . ranitidine (ZANTAC) 150 MG tablet 1 tablet qam    . traMADol (ULTRAM) 50 MG tablet Take 1 tablet (50 mg total) by mouth at bedtime as needed. 30 tablet 0  . traMADol (ULTRAM) 50 MG tablet Take 1 tablet (50 mg total) by mouth 3 (three) times daily as needed. 90 tablet 0  . traZODone (DESYREL) 100 MG tablet TAKE ONE-HALF TO ONE TABLET BY MOUTH ONCE DAILY FOR SLEEP AS DIRECTED 90 tablet 0  . VESICARE 10 MG tablet TAKE ONE TABLET BY MOUTH ONCE DAILY 30 tablet 5  .  No current facility-administered medications on file prior to visit.   Allergies  Allergen Reactions  . Hydrocodone       nausea and vomiting and headaches.  . Advil [Ibuprofen]     itching  . Cephalexin      Neuropathy  . Codeine     Crazy in the head, bp drops  . Levofloxacin Itching  . Meperidine Hcl     B/P drops  . Morphine And Related      States" extreme migraines"  . Oxycodone-Acetaminophen Other (See Comments)    Crazy in head, drop in bp  . Sulfonamide Derivatives Nausea And Vomiting    Stomach upset  . Adhesive [Tape] Rash    Latex in adhesive tape. Please use paper tape  . Erythromycin Diarrhea and Rash  . Penicillins Nausea And Vomiting and Rash   Family History  Problem Relation Age of Onset  . Alcohol abuse Mother   . Other Mother     accidental med overdose  . Emphysema Mother     smoked  . Bipolar disorder Mother   . Other Father     CHF  . Diabetes Father   . Cancer Father     throat ca/ smoked  . Alcohol abuse Father   . Stroke Father   . Hypertension Brother   . Hyperlipidemia Brother   . Obesity Brother   . Heart attack Maternal Grandfather   . Heart attack Paternal Grandmother   . Heart attack Paternal Grandfather   . Arthritis Son     psoriatic  . Arthritis Son     psoriatic  . Obesity Son    PE: BP 104/62 mmHg  Pulse 70  Temp(Src) 98.6 F (37 C) (Oral)  Resp 12  Wt 209 lb 9.6 oz (95.074 kg)  SpO2 97% Body mass index is 42.31 kg/(m^2). Wt Readings  from Last 3 Encounters:  03/23/15 209 lb 9.6 oz (95.074 kg)  02/22/15 211 lb (95.709 kg)  01/10/15 209 lb (94.802 kg)   Constitutional: obese, in NAD.  Pale. Eyes: PERRLA, EOMI, no exophthalmos ENT: moist mucous membranes, no thyromegaly, no cervical lymphadenopathy Cardiovascular: RRR, + 2/6 SEM, no RG Respiratory: CTA B Gastrointestinal: abdomen soft, NT, ND, BS+ Musculoskeletal: no deformities, strength intact in all 4 Skin: moist, warm, no rashes Neurological: no tremor with outstretched hands, DTR normal in all 4  Assessment: 1. Hypo/normo-calcemia/hyperparathyroidism - Likely secondary to  calcium/vitamin D malabsorption  2. Status post gastric bypass - with malabsorption   Plan: 1. Patient has had several instances of low calcium in the past, but had normocalcemia more recently.  In  07/2014, she was found to have a high parathyroid hormone obtained in the setting of a normal calcium level. A vitamin D level obtained at that time was very low, at 9.7. Of note, patient has a significant  PMH of biliary diversion gastric bypass with subsequent malabsorption. She cannot take multivitamins because of abdominal pain and nausea. She was on IV iron in the past, as she cannot tolerate po iron.  I believe that her history of gastric bypass and subsequently vitamin D deficiency is the cause for her elevated PTH and history of hypocalcemia. - I  again discussed with the patient about the physiology of calcium -  Vitamin D - parathyroid hormone and the fact that the PTH being elevated in the setting of low vitamin D means that her parathyroid glands are functioning  appropriately. Her vitamin D is improving, and I plan to recheck her PTH only after the vitamin D is stable in the normal range. For now, continue the Cholecalciferol 100,000 units weekly >> needs a recheck of the vitamin D level in ~1 mo. If normal, will need a recheck of the vitamin D (adding a PTH - [facility] - and Calcium) in 1-2 mo after this lab result.  -  If the PTH remains high after vitamin D normalizes, will need further investigation  2.  Status post gastric bypass - This was complicated with malabsorption of multiple nutrients -  at last visit,I strongly advised the patient to start a multivitamin, recommended a men's multivitamin since she cannot tolerate iron >> she tells me she could not tolerate these

## 2015-03-24 DIAGNOSIS — M5416 Radiculopathy, lumbar region: Secondary | ICD-10-CM | POA: Diagnosis not present

## 2015-03-25 ENCOUNTER — Other Ambulatory Visit: Payer: Self-pay | Admitting: Neurosurgery

## 2015-03-25 DIAGNOSIS — M5416 Radiculopathy, lumbar region: Secondary | ICD-10-CM

## 2015-03-28 DIAGNOSIS — H02833 Dermatochalasis of right eye, unspecified eyelid: Secondary | ICD-10-CM | POA: Diagnosis not present

## 2015-03-28 DIAGNOSIS — H25813 Combined forms of age-related cataract, bilateral: Secondary | ICD-10-CM | POA: Diagnosis not present

## 2015-03-28 DIAGNOSIS — H18463 Peripheral corneal degeneration, bilateral: Secondary | ICD-10-CM | POA: Diagnosis not present

## 2015-03-29 DIAGNOSIS — I6522 Occlusion and stenosis of left carotid artery: Secondary | ICD-10-CM | POA: Diagnosis not present

## 2015-03-29 DIAGNOSIS — R0989 Other specified symptoms and signs involving the circulatory and respiratory systems: Secondary | ICD-10-CM | POA: Diagnosis not present

## 2015-03-31 ENCOUNTER — Telehealth: Payer: Self-pay | Admitting: Family Medicine

## 2015-03-31 NOTE — Telephone Encounter (Signed)
Patient Name: JETTA MEER DOB: 1945/06/03 Initial Comment Caller states wife is having fever 99.5 with meds her normal temp is 97.5 chills -vomiting-Cannot walk-Fainted ealier today Nurse Assessment Nurse: Ronnald Ramp, RN, Miranda Date/Time (Eastern Time): 03/31/2015 11:16:56 AM Confirm and document reason for call. If symptomatic, describe symptoms. You must click the next button to save text entered. ---Caller states his wife has been vomiting, has a fever, weakness, and fainted while she was on the toilet. He states she is not able to be seen because she will pass out. Has the patient traveled out of the country within the last 30 days? ---Not Applicable Does the patient have any new or worsening symptoms? ---Yes Will a triage be completed? ---Yes Related visit to physician within the last 2 weeks? ---No Does the PT have any chronic conditions? (i.e. diabetes, asthma, etc.) ---Yes List chronic conditions. ---GERD, Anxiety, High Cholesterol, Is this a behavioral health or substance abuse call? ---No Guidelines Guideline Title Affirmed Question Affirmed Notes Vomiting Shock suspected (e.g., cold/pale/clammy skin, too weak to stand, low BP, rapid pulse) Final Disposition User Call EMS 911 Now Ronnald Ramp, RN, Miranda Referrals GO TO FACILITY REFUSED Disagree/Comply: Disagree Disagree/Comply Reason: Wait and see

## 2015-03-31 NOTE — Telephone Encounter (Signed)
Husband Dominica Severin) called stating that patient woke up this morning with a fever of 101. She is having diarrhea, vomiting, chills and nausea. Transferred to [facility]. Spoke with [employer]

## 2015-03-31 NOTE — Telephone Encounter (Signed)
As long as she is hydrating decent she can take 1-2 Advil with a cracker

## 2015-03-31 NOTE — Telephone Encounter (Signed)
Patient in feeling better and taking 2 advil evey 4 hours, due to having a bad headache, is that ok or can she take more?? The patient did verbalize understanding and agreement of all instructions.[name]

## 2015-03-31 NOTE — Telephone Encounter (Signed)
Patient informed of PCP instructions. 

## 2015-03-31 NOTE — Telephone Encounter (Signed)
Please check in with patient and see how she is doing. Likely she is struggling with gastroenteritis (stomach bug) but make sure she knows with her chronic conditions she is at high risk from getting very sick very quickly so she needs to be seen if she worsens, keeps loosing fluids with diarrhea and vomiting and or if she she has mental status changes or worsening fever. Make sure she hydrates well with gatorade, ginger ale, sprite etc. Maintain clear liquids x 24 hours then progress to [employer] (bananas, rice, applesauce, toast) for 24 more hours and progress as needed.

## 2015-03-31 NOTE — Telephone Encounter (Signed)
Call from [facility] with [facility] that pt Refused to call 911 or go to ER/facility - informed Freight forwarder LBSW

## 2015-04-03 ENCOUNTER — Other Ambulatory Visit: Payer: Medicare Other

## 2015-04-06 ENCOUNTER — Ambulatory Visit
Admission: RE | Admit: 2015-04-06 | Discharge: 2015-04-06 | Disposition: A | Discharge status: Home / Self Care | Payer: Medicare Other | Source: Ambulatory Visit | Attending: Neurosurgery | Admitting: Neurosurgery

## 2015-04-06 DIAGNOSIS — H18713 Corneal ectasia, bilateral: Secondary | ICD-10-CM | POA: Diagnosis not present

## 2015-04-06 DIAGNOSIS — M5416 Radiculopathy, lumbar region: Secondary | ICD-10-CM

## 2015-04-06 DIAGNOSIS — M4806 Spinal stenosis, lumbar region: Secondary | ICD-10-CM | POA: Diagnosis not present

## 2015-04-06 MED ORDER — GADOBENATE DIMEGLUMINE 529 MG/ML IV SOLN
20.0000 mL | Freq: Once | INTRAVENOUS | Status: AC | PRN
Start: 2015-04-06 — End: 2015-04-06
  Administered 2015-04-06: 20 mL via INTRAVENOUS

## 2015-04-20 DIAGNOSIS — M5416 Radiculopathy, lumbar region: Secondary | ICD-10-CM | POA: Diagnosis not present

## 2015-04-20 IMAGING — CR DG CHEST 2V
2 series · 2 of 2 positions shown · non-contrast
Comparison: Chest radiograph 02/01/2012.

CLINICAL DATA: Cough.

EXAM:
CHEST  2 VIEW

[view not recorded (1 of 2)]
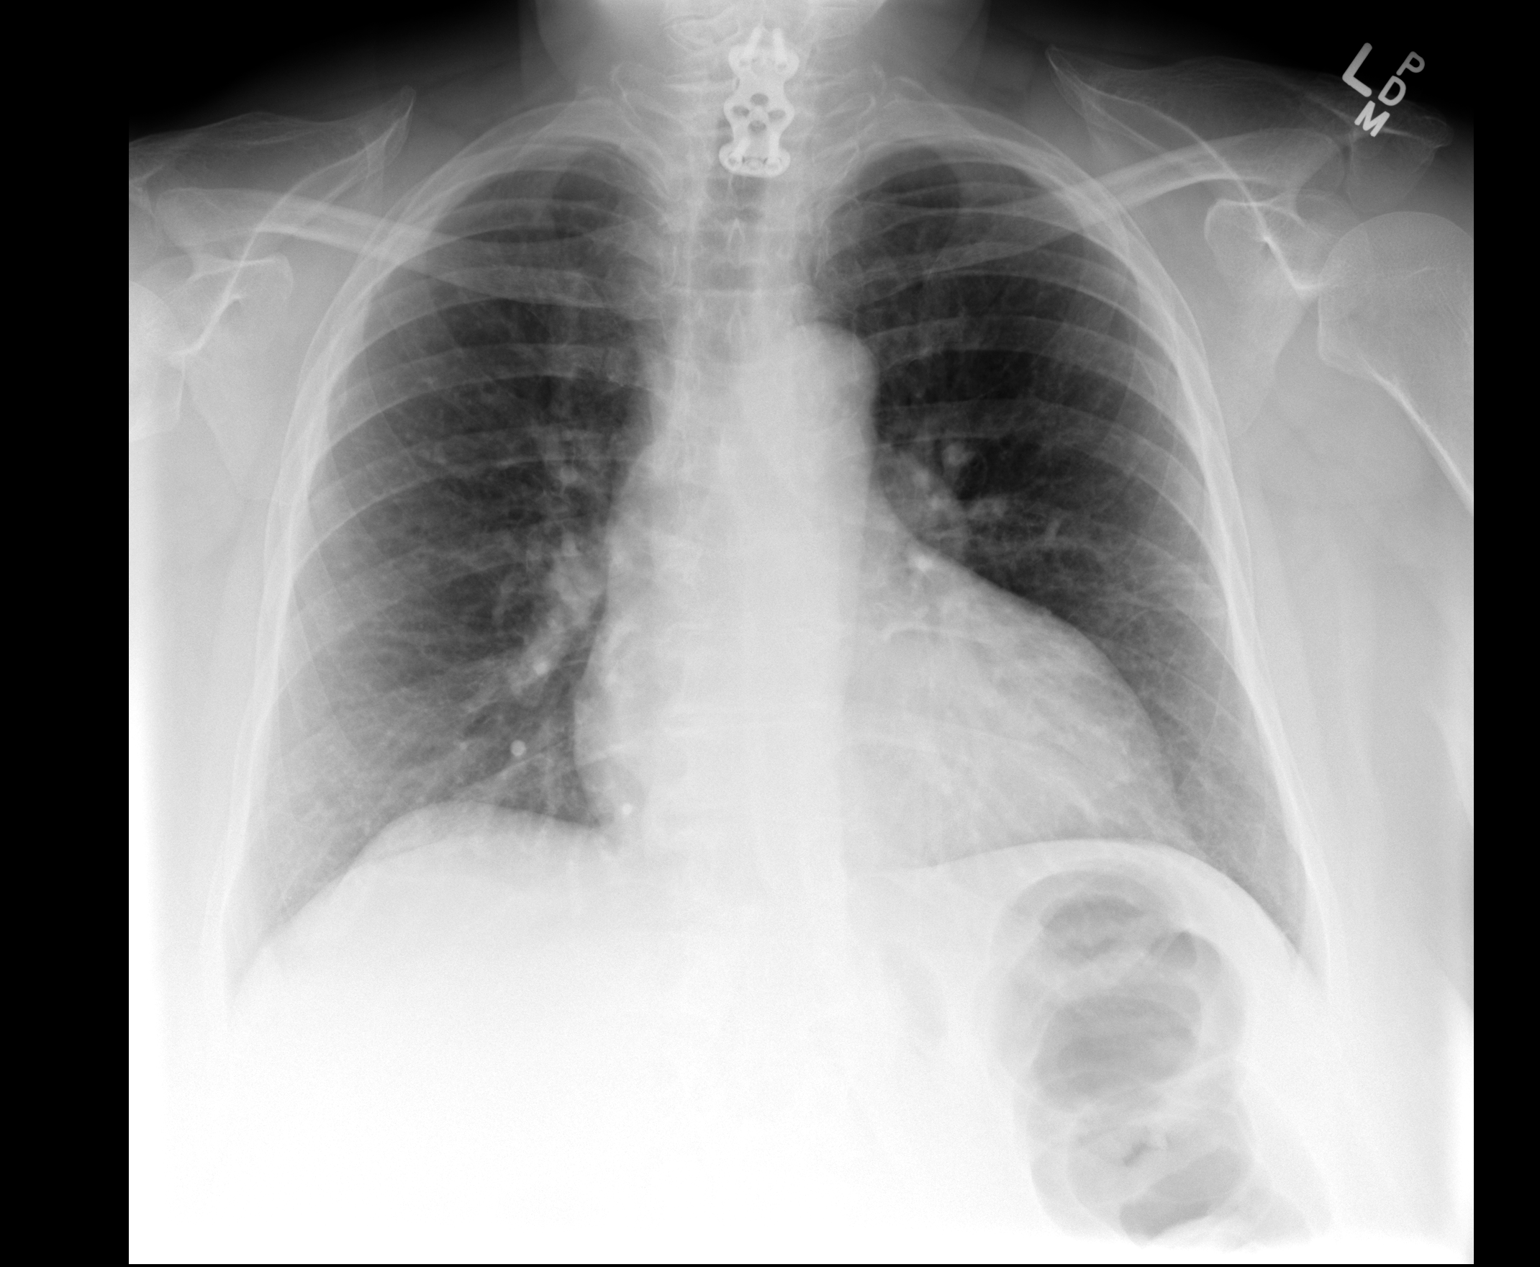

[view not recorded (2 of 2)]
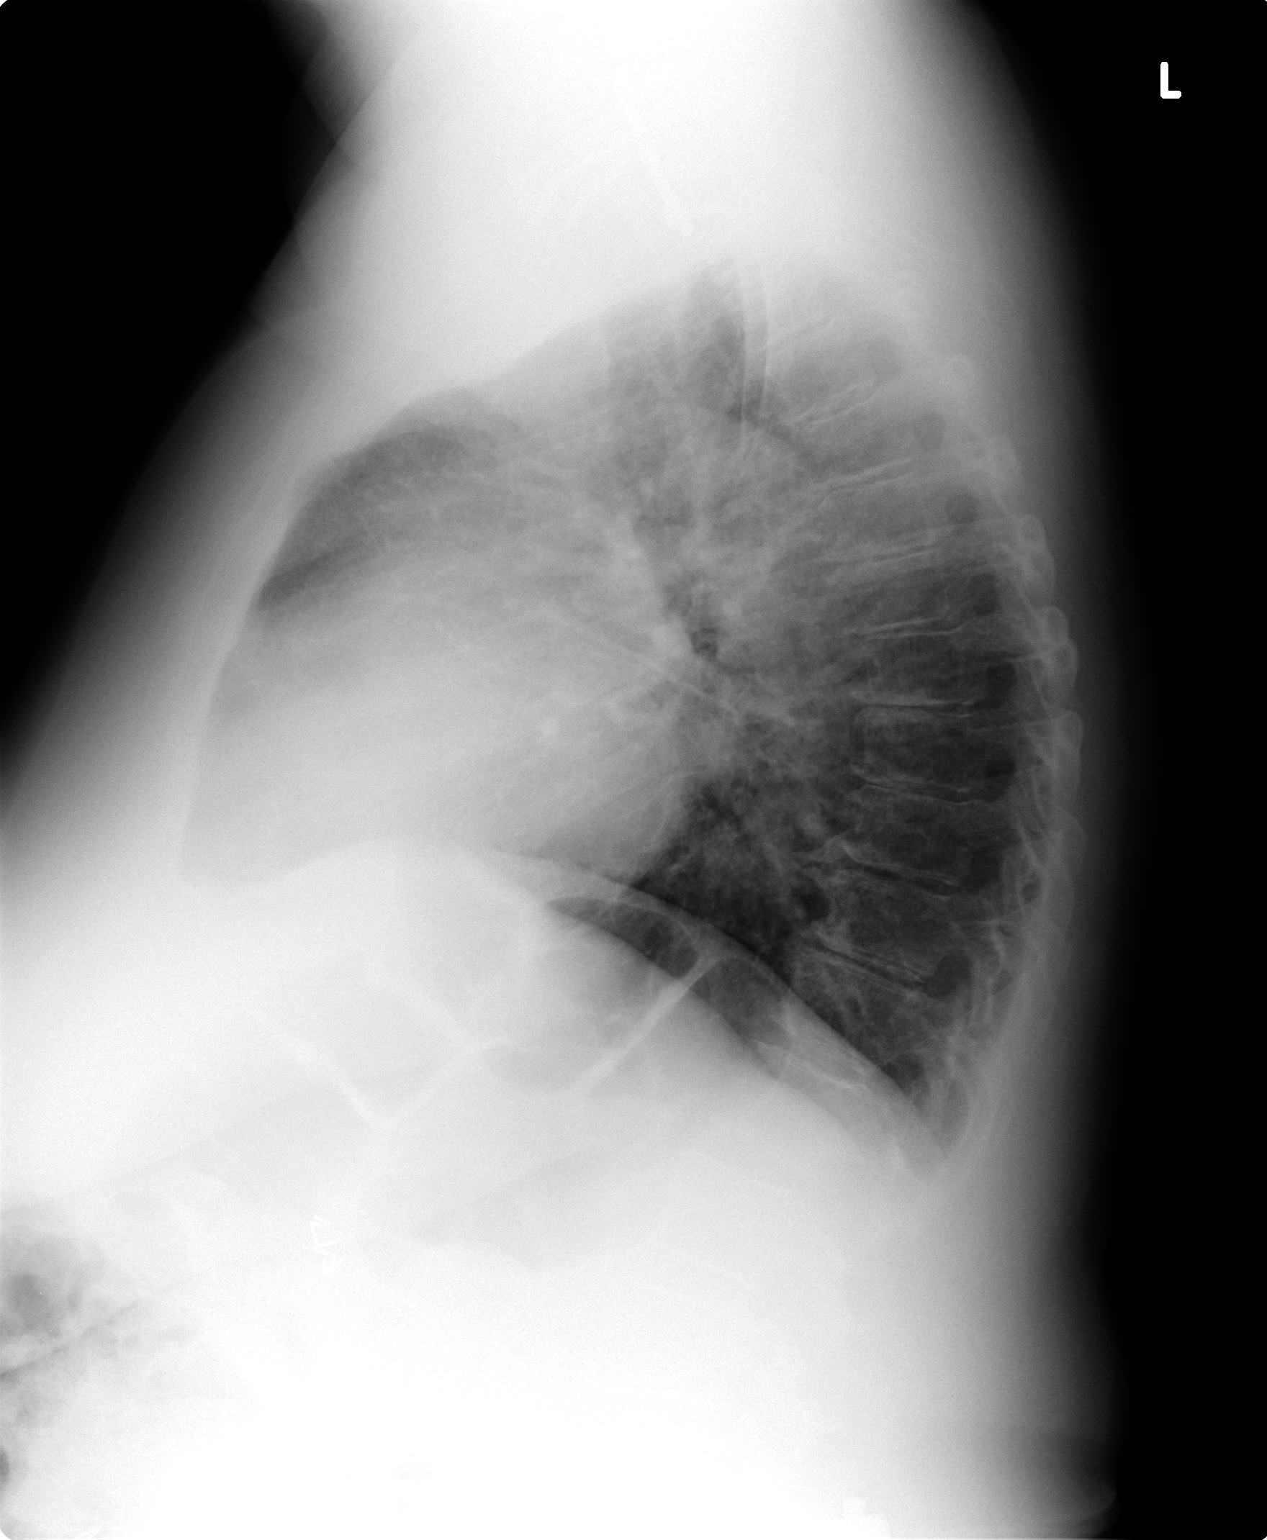

[2 of 2 positions shown; findings below may reference images not displayed]

FINDINGS: Anterior cervical spinal fusion hardware. Stable cardiomegaly. No
consolidative pulmonary opacities. No pleural effusion or
pneumothorax. Minimal linear opacities within the left mid lung
could represent atelectasis and or scarring. Elevation of the right
hemidiaphragm. Mid thoracic spine degenerative change.
Cholecystectomy clips.
IMPRESSION: No acute cardiopulmonary process. Left midlung atelectasis and or
scarring.

## 2015-04-25 ENCOUNTER — Other Ambulatory Visit: Payer: Self-pay | Admitting: Family Medicine

## 2015-04-28 DIAGNOSIS — I350 Nonrheumatic aortic (valve) stenosis: Secondary | ICD-10-CM | POA: Diagnosis not present

## 2015-04-28 DIAGNOSIS — Z01810 Encounter for preprocedural cardiovascular examination: Secondary | ICD-10-CM | POA: Diagnosis not present

## 2015-04-28 DIAGNOSIS — E662 Morbid (severe) obesity with alveolar hypoventilation: Secondary | ICD-10-CM | POA: Diagnosis not present

## 2015-04-28 DIAGNOSIS — I6522 Occlusion and stenosis of left carotid artery: Secondary | ICD-10-CM | POA: Diagnosis not present

## 2015-04-29 DIAGNOSIS — M5126 Other intervertebral disc displacement, lumbar region: Secondary | ICD-10-CM | POA: Diagnosis not present

## 2015-04-29 DIAGNOSIS — M5116 Intervertebral disc disorders with radiculopathy, lumbar region: Secondary | ICD-10-CM | POA: Diagnosis not present

## 2015-05-05 ENCOUNTER — Ambulatory Visit (INDEPENDENT_AMBULATORY_CARE_PROVIDER_SITE_OTHER): Payer: Medicare Other | Admitting: Family Medicine

## 2015-05-05 ENCOUNTER — Encounter: Payer: Self-pay | Admitting: Family Medicine

## 2015-05-05 VITALS — BP 115/60 | HR 70 | Temp 98.4°F | Ht 60.0 in | Wt 206.6 lb

## 2015-05-05 DIAGNOSIS — I950 Idiopathic hypotension: Secondary | ICD-10-CM | POA: Diagnosis not present

## 2015-05-05 DIAGNOSIS — Z9889 Other specified postprocedural states: Secondary | ICD-10-CM

## 2015-05-05 DIAGNOSIS — K120 Recurrent oral aphthae: Secondary | ICD-10-CM | POA: Diagnosis not present

## 2015-05-05 MED ORDER — FLUOCINONIDE 0.05 % EX GEL: CUTANEOUS | Status: DC

## 2015-05-05 NOTE — Patient Instructions (Addendum)
It apears that you have aphthous ulcers in your mouth - these can be caused by viral illness, stress, irritation or certain drugs Use the fluocinonide gel on your finger or a q-tip on the ulcers 2-4x a day, and then cover with an OTC oral pain reliever such as orabase Try some soft foods such as smoothies until your mouth heals gatorade may also help keep your blood pressure up  Let me know if you are not feeling better soon!  I do not see any sign of a dangerous allergic reaction but if you have any widespread hives, swelling of the lips or tongue seek care right away

## 2015-05-05 NOTE — Progress Notes (Signed)
Pre visit review using our clinic review tool, if applicable. No additional management support is needed unless otherwise documented below in the visit note. 

## 2015-05-05 NOTE — Progress Notes (Signed)
[place] at [facility] [address] [place], [place] [address] [id number] [phone]  Date:  05/05/2015   Name:  [name]   DOB:  August 09, 1945   MRN:  [id number]  PCP:  Penni Homans, MD    Chief Complaint: Thrush   History of Present Illness:  [name] is a 70 y.o. very pleasant female patient who presents with the following:  She had surgery for a ruptured disc last Friday- she was given an IV abx in the OR, she does not know what exactly.  The next day she noted that her mouth hurt, she figured this was due to her intubation. However this has gotten worse, she now has painful blisters in her mouth. She thinks that she may have thrush or could be allergic to the abx that she received.   She is using tramadol and advil for her post- op pain.  Excessive advil will give her hives.  No lip or tongue swelling, no SOB, no hives on her body   Her operation was done by Dr. Annette Stable at the outpt surgical center, she was able to go home the same day.   She has tried some OTC mouth pain treatments but they have not helped her much   BP Readings from Last 3 Encounters:  05/05/15 112/54  03/23/15 104/62  02/22/15 140/78   She admits that she is not eating much- her mouth hurts too much Noted that her BP is low today- she has not felt lightheaded but she has felt tired and worn out since her operation.  She does not take any BP medication   Patient Active Problem List   Diagnosis Date Noted  . Pes anserine bursitis 09/30/2014  . Hyperparathyroidism ([place]) 08/18/2014  . Abdominal pain 08/18/2014  . Medicare annual wellness visit, subsequent 08/18/2014  . Osteopenia 08/05/2014  . Capsulitis of ankle 02/23/2014  . Leg length discrepancy 02/23/2014  . Plantar fasciitis of left foot 11/17/2013  . Pruritus 10/03/2013  . Narcolepsy without cataplexy 02/08/2013  . S/P right TKA 02/11/2012  . Motion sickness 12/24/2011  . Nausea & vomiting  12/24/2011  . Varicose veins of lower limb 10/02/2011  . Knee pain, right 09/20/2011  . Depression with anxiety 01/08/2011  . Back pain 01/08/2011  . Allergic rhinoconjunctivitis, seasonal and perennial 09/21/2010  . SCC (squamous cell carcinoma), face 09/19/2010  . Otitis media of left ear 09/19/2010  . Chills 07/12/2010  . Menopause 07/12/2010  . Ventral hernia 04/06/2010  . COUGH 04/06/2010  . ABDOMINAL PAIN, GENERALIZED 04/06/2010  . ENCOPRESIS 03/21/2010  . DERMATITIS, ATOPIC 03/21/2010  . Diabetes mellitus type 2 in obese ([place]) 03/07/2010  . DIABETIC  RETINOPATHY 03/07/2010  . OTHER HYPERPARATHYROIDISM 03/07/2010  . Vitamin D deficiency 03/07/2010  . Hyperlipidemia, mixed 03/07/2010  . Hypocalcemia 03/07/2010  . Iron deficiency anemia 03/07/2010  . TREMOR, ESSENTIAL 03/07/2010  . MITRAL REGURGITATION, 0 (MILD) 03/07/2010  . TRICUSPID REGURGITATION, MILD 03/07/2010  . ESSENTIAL HYPERTENSION, BENIGN 03/07/2010  . CARDIOMEGALY, MILD 03/07/2010  . DEGENERATIVE JOINT DISEASE, LEFT HIP 03/07/2010  . Disorder of bone and cartilage 03/07/2010  . Diarrhea 03/07/2010  . UNSPECIFIED URINARY INCONTINENCE 03/07/2010  . PERSONAL HX OF METHICILLIN RESIST STAPH AUREUS 03/07/2010  . DYSPNEA 11/24/2007  . Obesity 02/27/2007  . Obstructive sleep apnea 02/27/2007  . Seasonal and perennial allergic rhinitis 02/27/2007  . ESOPHAGEAL REFLUX 02/27/2007  . FIBROMYALGIA 02/27/2007    Past Medical History  Diagnosis Date  . Obesity   . Esophageal reflux   . Fibromyalgia   . Asthma   . Allergy     rhinitis  . Sleep apnea, obstructive   . Bronchitis, acute 10/31/2010  . Otitis media of both ears 11/16/2010  . Depression with anxiety 01/08/2011  . Back pain 01/08/2011  . Sinusitis 06/11/2011  . Fatigue 06/11/2011  . Knee pain, right 09/20/2011  . Otitis media 09/20/2011  . Varicose veins of lower limb 10/02/2011  . Otitis media of left ear 09/19/2010  . Motion sickness 12/24/2011  .  Sinusitis 12/24/2011  . Nausea & vomiting 12/24/2011  . Heart murmur   . Panic attacks   . Arthritis 02-01-12    osteoarthritis, Back pain, spine surgeries ? retain hardware cervial and  lumbar.  . Anemia 02-01-12    iron absorption problem  . Cancer ([place]) 02-01-12    squamous cell -face  . Allergic state 09/21/2010  . Osteopenia 08/05/2014  . Hyperparathyroidism ([place]) 08/18/2014  . Abdominal pain 08/18/2014    Past Surgical History  Procedure Laterality Date  . Bariatric surgery    . Lumbar disc surgery    . Bladder suspension      complicated by bowel nick/ clolostomy  . Gallbladder surgery  1978  . Appendectomy  1962  . Gastric bypass  2003    Patient also noted "gastric bypass resection - 2004"  . Cervical disc surgery      C-7 in 2004, C-4 and C-5 in 2010  . Colostomy bag      Confirm with patient. Listed under medical conditions on form dated 07/18/09.  [name]      Per medical history form dated 07/18/09.  [name] Hernia repair  2009    Two hernias and abdominal reconstruction  . Ivc filter      prophyllactically- no hx DVT  . Carpal tunnel release  02-01-12    right  . Colostomy reversal  02-01-12  . Abdominal surgery  02-01-12    ABDOMINAL SURGERY  . Vulva surgery  02-01-12    cyst removal-pt 8 months pregnant  . Total knee arthroplasty  02/11/2012    Procedure: TOTAL KNEE ARTHROPLASTY;  Surgeon: Mauri Pole, MD;  Location: [facility];  Service: Orthopedics;  Laterality: Right;    Social History  Substance Use Topics  . Smoking status: Never Smoker   . Smokeless tobacco: Never Used  . Alcohol Use: No     Comment: special occasions    Family History  Problem Relation Age of Onset  . Alcohol abuse Mother   . Other Mother     accidental med overdose  . Emphysema Mother     smoked  . Bipolar disorder Mother   . Other Father     CHF  . Diabetes Father   . Cancer Father     throat ca/ smoked  . Alcohol abuse Father   . Stroke Father   . Hypertension Brother    . Hyperlipidemia Brother   . Obesity Brother   . Heart attack Maternal Grandfather   . Heart attack Paternal Grandmother   . Heart attack Paternal Grandfather   . Arthritis Son     psoriatic  . Arthritis Son     psoriatic  . Obesity Son     Allergies  Allergen Reactions  . Hydrocodone      nausea and vomiting and headaches.  . Advil [Ibuprofen]     itching  . Cephalexin  Neuropathy  . Codeine     Crazy in the head, bp drops  . Levofloxacin Itching  . Meperidine Hcl     B/P drops  . Morphine And Related      States" extreme migraines"  . Oxycodone-Acetaminophen Other (See Comments)    Crazy in head, drop in bp  . Sulfonamide Derivatives Nausea And Vomiting    Stomach upset  . Adhesive [Tape] Rash    Latex in adhesive tape. Please use paper tape  . Erythromycin Diarrhea and Rash  . Penicillins Nausea And Vomiting and Rash    Medication list has been reviewed and updated.  Current Outpatient Prescriptions on File Prior to Visit  Medication Sig Dispense Refill  . ALPRAZolam (XANAX) 0.25 MG tablet Take 0.25 mg by mouth daily as needed. For anxiety    . aspirin 81 MG tablet Take 81 mg by mouth daily.    [name] atorvastatin (LIPITOR) 10 MG tablet Take 10 mg by mouth daily.    [name] azelastine (ASTELIN) 0.1 % nasal spray Place 2 sprays into both nostrils 2 (two) times daily. Use in each nostril as directed 30 mL 3  . Azelastine-Fluticasone (DYMISTA) 137-50 MCG/ACT SUSP Place 1-2 puffs into the nose at bedtime. 1 Bottle prn  . bifidobacterium infantis (ALIGN) capsule Take 1 capsule by mouth daily.     . Cholecalciferol (VITAMIN D3) 50000 units CAPS Take 2 capsules by mouth once a week. 8 capsule 4  . DULoxetine (CYMBALTA) 60 MG capsule TAKE ONE CAPSULE BY MOUTH ONCE DAILY 90 capsule 1  . fluticasone (FLONASE) 50 MCG/ACT nasal spray Place 2 sprays into both nostrils daily. 16 g 3  . methylphenidate (RITALIN) 10 MG tablet Take 1 tablet (10 mg total) by mouth 2 (two) times daily.  (Patient taking differently: Take 10 mg by mouth as needed. ) 60 tablet 0  . NON FORMULARY CPAP- set on 12 [employer].    . NONFORMULARY OR COMPOUNDED ITEM Allergy Vaccine 1:10 Given at Home    . omeprazole (PRILOSEC OTC) 20 MG tablet Take 20 mg by mouth daily.    . ranitidine (ZANTAC) 150 MG tablet 1 tablet qam    . traMADol (ULTRAM) 50 MG tablet Take 1 tablet (50 mg total) by mouth 3 (three) times daily as needed. 90 tablet 0  . traZODone (DESYREL) 100 MG tablet TAKE ONE-HALF TO ONE TABLET BY MOUTH ONCE DAILY FOR SLEEP AS DIRECTED 90 tablet 0  . VESICARE 10 MG tablet TAKE ONE TABLET BY MOUTH ONCE DAILY 30 tablet 2   No current facility-administered medications on file prior to visit.    Review of Systems:  As per HPI- otherwise negative.   Physical Examination: Filed Vitals:   05/05/15 1457  BP: 112/54  Pulse: 70  Temp: 98.4 F (36.9 C)   Filed Vitals:   05/05/15 1457  Height: 5' (1.524 m)  Weight: 206 lb 9.6 oz (93.713 kg)   Body mass index is 40.35 kg/(m^2). Ideal Body Weight: Weight in (lb) to have BMI = 25: 127.7  GEN: WDWN, NAD, Non-toxic, A & O x 3, obese, looks well HEENT: Atraumatic, Normocephalic. Neck supple. No masses, No LAD.  Bilateral TM wnl, oropharynx normal.  PEERL,EOMI.   aphthous ulcers on her gums and palate  Ears and Nose: No external deformity. CV: RRR, No M/G/R. No JVD. No thrill. No extra heart sounds. PULM: CTA B, no wheezes, crackles, rhonchi. No retractions. No resp. distress. No accessory muscle use. Incision on her lower back appears  to be healing well, no sign of infection.   EXTR: No c/c/e NEURO Normal gait.  PSYCH: Normally interactive. Conversant. Not depressed or anxious appearing.  Calm demeanor.   Recheck BP improved to 115/60.  Her BP is generally on the low side  BP Readings from Last 3 Encounters:  05/05/15 115/60  03/23/15 104/62  02/22/15 140/78    Assessment and Plan: Aphthous ulcer - Plan: fluocinonide gel (LIDEX) 0.05  %  Idiopathic hypotension  Recent major surgery  aphthous ulcers, may be due to stress, surgery, viral illness or sensitivity to medication. In any case will treat with fluocinonide gel and OTC orabase or oragel. She will let me know if this is not helpful It apears that you have aphthous ulcers in your mouth - these can be caused by viral illness, stress, irritation or certain drugs Use the fluocinonide gel on your finger or a q-tip on the ulcers 2-4x a day, and then cover with an OTC oral pain reliever such as orabase Try some soft foods such as smoothies until your mouth heals gatorade may also help keep your blood pressure up  Let me know if you are not feeling better soon!  I do not see any sign of a dangerous allergic reaction but if you have any widespread hives, swelling of the lips or tongue seek care right away  Low BP likely due to inactivity and poor oral intake. Encouraged gatorade and soft foods.    Signed Lamar Blinks, MD

## 2015-05-12 DIAGNOSIS — H2511 Age-related nuclear cataract, right eye: Secondary | ICD-10-CM | POA: Diagnosis not present

## 2015-05-12 DIAGNOSIS — H25811 Combined forms of age-related cataract, right eye: Secondary | ICD-10-CM | POA: Diagnosis not present

## 2015-05-13 DIAGNOSIS — H43813 Vitreous degeneration, bilateral: Secondary | ICD-10-CM | POA: Diagnosis not present

## 2015-05-23 ENCOUNTER — Telehealth: Payer: Self-pay | Admitting: Internal Medicine

## 2015-05-23 DIAGNOSIS — J309 Allergic rhinitis, unspecified: Secondary | ICD-10-CM | POA: Diagnosis not present

## 2015-05-23 NOTE — Telephone Encounter (Signed)
Allergy Serum Extract Date Mixed: 05/23/2015 Vial: AB Strength: 1:10 Here/Mail/Pick Up: Mail Mixed By: Desmond Dike, CMA Last OV: 01/10/2015 Pending OV: 01/13/2016

## 2015-05-30 ENCOUNTER — Other Ambulatory Visit: Payer: Self-pay | Admitting: Family Medicine

## 2015-05-30 DIAGNOSIS — H2512 Age-related nuclear cataract, left eye: Secondary | ICD-10-CM | POA: Diagnosis not present

## 2015-05-30 NOTE — Telephone Encounter (Signed)
Last refill 01/07/15  #90 with 0 refills Last office visit 03/23/2015

## 2015-06-07 ENCOUNTER — Encounter: Payer: Self-pay | Admitting: Family Medicine

## 2015-06-07 ENCOUNTER — Ambulatory Visit (INDEPENDENT_AMBULATORY_CARE_PROVIDER_SITE_OTHER): Payer: Medicare Other | Admitting: Family Medicine

## 2015-06-07 VITALS — BP 124/82 | HR 57 | Ht 60.0 in | Wt 195.0 lb

## 2015-06-07 DIAGNOSIS — M5416 Radiculopathy, lumbar region: Secondary | ICD-10-CM | POA: Diagnosis not present

## 2015-06-07 MED ORDER — NORTRIPTYLINE HCL 10 MG PO CAPS: 10.0000 mg | ORAL_CAPSULE | Freq: Every day | ORAL | Status: DC

## 2015-06-07 NOTE — Progress Notes (Signed)
Pre visit review using our clinic review tool, if applicable. No additional management support is needed unless otherwise documented below in the visit note. 

## 2015-06-07 NOTE — Patient Instructions (Signed)
Good to se eyo u You look great  Try to avoid twisting as much as you can  Try to change position to standing every 2 hours at worl No heavy lifting if you can help it.  Nortriptyline at night B12 1051mcg daily  B6 200mg  daily  Tart cherry extract any dose at night can help with arthritis pain .  TENS unit can help and use up to 2 times a day  Ice 20 minutes 2 times daily. Usually after activity and before bed. See me again in 6 weeks to make sure you are making progress.

## 2015-06-07 NOTE — Assessment & Plan Note (Signed)
I still believe the patient is having some radicular symptoms going down the right side. This could still be post surgical inflammation that could be contribute in. Seems in patient states that she is better than what she was prior to surgery and this can continue to get better with her only being 5 weeks out. We discussed over-the-counter medications and was given a trial of nortriptyline. Warned of potential side effects with patient having numerous side effects to other medications. Given some mild range of motion exercises and encouraged her to stay active.encouraged patient to continue to monitor weight. Follow-up with me again in 3 weeks for further evaluation and treatment. I do believe that patient has used her TENS unit previously and has been beneficial. Encourage her to do this 2 times a day and given icing protocol.

## 2015-06-07 NOTE — Progress Notes (Signed)
[name] [place], [place] [address] Phone: [phone] Subjective:    I'm seeing this patient by the request  of:  BLYTH, Erline Levine, MD   CC: right hip and back pain   [id number] [name] is a 70 y.o. female coming in with complaint of right hip and back pain. Patient does have a long past medical history of back issues. Has had multiple fusions and recently did have a microdiscectomy at  L3-L4 by Dr. Annette Stable. This is 5 weeks ago. States that it has helped the pain mostly. Approximately 50% better. Still going down the lateral aspect the aspect of the hip.patient states his is not ankle down her laying but does sometimes go down to the knee. Patient states that it is annoying but not stopping her from anything. Patient is wondering anything else that can be done. Notices when she tries to increase her activity she is still having 15. States that certain did not want her to do physical therapy this time that she has done a numerous times previously.has used her TENS unit previously and has responded to this. Using it somewhat and is wondering if she can continue to do this.   most recent MRI of the back showed a broad-based disc herniation with compression of the right L3 nerve root. Patient does have some mild adjacent segment health for L5 but no nerve compression and patient fusion at L5-S1 seems to be normal. This was independently reviewed by me. Date of imaging was 04/06/2015.  Past Medical History  Diagnosis Date  . Obesity   . Esophageal reflux   . Fibromyalgia   . Asthma   . Allergy     rhinitis  . Sleep apnea, obstructive   . Bronchitis, acute 10/31/2010  . Otitis media of both ears 11/16/2010  . Depression with anxiety 01/08/2011  . Back pain 01/08/2011  . Sinusitis 06/11/2011  . Fatigue 06/11/2011  . Knee pain, right 09/20/2011  . Otitis media 09/20/2011  . Varicose veins of lower limb 10/02/2011  . Otitis media of left ear  09/19/2010  . Motion sickness 12/24/2011  . Sinusitis 12/24/2011  . Nausea & vomiting 12/24/2011  . Heart murmur   . Panic attacks   . Arthritis 02-01-12    osteoarthritis, Back pain, spine surgeries ? retain hardware cervial and  lumbar.  . Anemia 02-01-12    iron absorption problem  . Cancer ([place]) 02-01-12    squamous cell -face  . Allergic state 09/21/2010  . Osteopenia 08/05/2014  . Hyperparathyroidism ([place]) 08/18/2014  . Abdominal pain 08/18/2014   Past Surgical History  Procedure Laterality Date  . Bariatric surgery    . Lumbar disc surgery    . Bladder suspension      complicated by bowel nick/ clolostomy  . Gallbladder surgery  1978  . Appendectomy  1962  . Gastric bypass  2003    Patient also noted "gastric bypass resection - 2004"  . Cervical disc surgery      C-7 in 2004, C-4 and C-5 in 2010  . Colostomy bag      Confirm with patient. Listed under medical conditions on form dated 07/18/09.  [name]      Per medical history form dated 07/18/09.  [name] Hernia repair  2009    Two hernias and abdominal reconstruction  . Ivc filter      prophyllactically- no hx DVT  . Carpal tunnel release  02-01-12  right  . Colostomy reversal  02-01-12  . Abdominal surgery  02-01-12    ABDOMINAL SURGERY  . Vulva surgery  02-01-12    cyst removal-pt 8 months pregnant  . Total knee arthroplasty  02/11/2012    Procedure: TOTAL KNEE ARTHROPLASTY;  Surgeon: Mauri Pole, MD;  Location: [facility];  Service: Orthopedics;  Laterality: Right;   Social History   Social History  . Marital Status: Married    Spouse Name: N/A  . Number of Children: N/A  . Years of Education: N/A   Social History Main Topics  . Smoking status: Never Smoker   . Smokeless tobacco: Never Used  . Alcohol Use: No     Comment: special occasions  . Drug Use: No  . Sexual Activity: Yes   Other Topics Concern  . None   Social History Narrative   Allergies  Allergen Reactions  . Hydrocodone      nausea and  vomiting and headaches.  . Advil [Ibuprofen]     itching  . Cephalexin      Neuropathy  . Codeine     Crazy in the head, bp drops  . Levofloxacin Itching  . Meperidine Hcl     B/P drops  . Morphine And Related      States" extreme migraines"  . Oxycodone-Acetaminophen Other (See Comments)    Crazy in head, drop in bp  . Sulfonamide Derivatives Nausea And Vomiting    Stomach upset  . Adhesive [Tape] Rash    Latex in adhesive tape. Please use paper tape  . Erythromycin Diarrhea and Rash  . Penicillins Nausea And Vomiting and Rash   Family History  Problem Relation Age of Onset  . Alcohol abuse Mother   . Other Mother     accidental med overdose  . Emphysema Mother     smoked  . Bipolar disorder Mother   . Other Father     CHF  . Diabetes Father   . Cancer Father     throat ca/ smoked  . Alcohol abuse Father   . Stroke Father   . Hypertension Brother   . Hyperlipidemia Brother   . Obesity Brother   . Heart attack Maternal Grandfather   . Heart attack Paternal Grandmother   . Heart attack Paternal Grandfather   . Arthritis Son     psoriatic  . Arthritis Son     psoriatic  . Obesity Son     Past medical history, social, surgical and family history all reviewed in electronic medical record.  No pertanent information unless stated regarding to the chief complaint.   Review of Systems: No headache, visual changes, nausea, vomiting, diarrhea, constipation, dizziness, abdominal pain, skin rash, fevers, chills, night sweats, weight loss, swollen lymph nodes, body aches, joint swelling, muscle aches, chest pain, shortness of breath, mood changes.   Objective Blood pressure 124/82, pulse 57, height 5' (1.524 m), weight 195 lb (88.451 kg), SpO2 95 %.  General: No apparent distress alert and oriented x3 mood and affect normal, dressed appropriately.  HEENT: Pupils equal, extraocular movements intact  Respiratory: Patient's speak in full sentences and does not appear short of  breath  Cardiovascular: No lower extremity edema, non tender, no erythema  Skin: Warm dry intact with no signs of infection or rash on extremities or on axial skeleton.  Abdomen: Soft nontender  Neuro: Cranial nerves II through XII are intact, neurovascularly intact in all extremities with 2+ DTRs and 2+ pulses.  Lymph: No lymphadenopathy  of posterior or anterior cervical chain or axillae bilaterally.  Gait antalgic gait MSK:  Non tender with full range of motion and good stability and symmetric strength and tone of shoulders, elbows, wrist, hip, knee and ankles bilaterally. Arthritic changes of multiple joints Back Exam:  Inspection: Unremarkable  Motion: Flexion 35 deg, Extension 15 deg, Side Bending to 25 deg bilaterally,  Rotation to 25 deg bilaterally  SLR laying: questionable positive on the right side with some mild tightness of the hamstring. XSLR laying: Negative  Palpable tenderness: tender to palpation appear spinal musculature of the lumbar spine FABER: negative. Sensory change: Gross sensation intact to all lumbar and sacral dermatomes.  Reflexes: 2+ at both patellar tendons, 2+ at achilles tendons, Babinski's downgoing.  Strength at foot  Plantar-flexion: 5/5 Dorsi-flexion: 5/5 Eversion: 5/5 Inversion: 5/5  Leg strength  Quad: 5/5 Hamstring: 5/5 Hip flexor: 4/5on right compared to full strength on left Hip abductors: 3+/5 but symmetric     Impression and Recommendations:     This case required medical decision making of moderate complexity.      Note: This dictation was prepared with Dragon dictation along with smaller phrase technology. Any transcriptional errors that result from this process are unintentional.

## 2015-06-09 DIAGNOSIS — R32 Unspecified urinary incontinence: Secondary | ICD-10-CM | POA: Diagnosis not present

## 2015-06-09 DIAGNOSIS — Z6839 Body mass index (BMI) 39.0-39.9, adult: Secondary | ICD-10-CM | POA: Diagnosis not present

## 2015-06-09 DIAGNOSIS — Z01419 Encounter for gynecological examination (general) (routine) without abnormal findings: Secondary | ICD-10-CM | POA: Diagnosis not present

## 2015-06-26 ENCOUNTER — Other Ambulatory Visit: Payer: Self-pay | Admitting: Family Medicine

## 2015-06-27 DIAGNOSIS — H04811 Granuloma of right lacrimal passage: Secondary | ICD-10-CM | POA: Diagnosis not present

## 2015-07-08 DIAGNOSIS — H1131 Conjunctival hemorrhage, right eye: Secondary | ICD-10-CM | POA: Diagnosis not present

## 2015-07-14 ENCOUNTER — Encounter: Payer: Self-pay | Admitting: Family Medicine

## 2015-07-14 ENCOUNTER — Ambulatory Visit (INDEPENDENT_AMBULATORY_CARE_PROVIDER_SITE_OTHER): Payer: Medicare Other | Admitting: Family Medicine

## 2015-07-14 VITALS — BP 122/72 | HR 66 | Ht 60.0 in | Wt 193.0 lb

## 2015-07-14 DIAGNOSIS — M7150 Other bursitis, not elsewhere classified, unspecified site: Secondary | ICD-10-CM | POA: Diagnosis not present

## 2015-07-14 DIAGNOSIS — M5416 Radiculopathy, lumbar region: Secondary | ICD-10-CM

## 2015-07-14 DIAGNOSIS — M7050 Other bursitis of knee, unspecified knee: Secondary | ICD-10-CM

## 2015-07-14 DIAGNOSIS — E669 Obesity, unspecified: Secondary | ICD-10-CM

## 2015-07-14 NOTE — Assessment & Plan Note (Signed)
'  s bursitis again at this time. Patient is had this in the past. Patient has forgotten all about the different exercises. With patient's recent back surgery I would like her to be seen and some formal physical therapy but I think will be beneficial. We discussed icing regimen. Does not appear to be radiating from the back. Patient is on nortriptyline. We'll continue to monitor. We did discuss compression. Follow-up in 6 weeks

## 2015-07-14 NOTE — Progress Notes (Signed)
Pre visit review using our clinic review tool, if applicable. No additional management support is needed unless otherwise documented below in the visit note. 

## 2015-07-14 NOTE — Assessment & Plan Note (Signed)
Status post surgery. Has had no formal physical therapy. Does have a week or. Sent to formal physical therapy for rehabilitation. Follow-up again in 6 weeks.

## 2015-07-14 NOTE — Assessment & Plan Note (Signed)
Encourage weight loss. 

## 2015-07-14 NOTE — Progress Notes (Signed)
[name] [place] [place], [place] [address] Phone: [phone] Subjective:    I'm seeing this patient by the request  of:  BLYTH, Gail Levine, MD   CC: right hip and back pain   [id number] [name] is a 70 y.o. female coming in with complaint of right hip and back pain. Patient does have a long past medical history of back issues. Has had multiple fusions and recently did have a microdiscectomy at  L3-L4 by Dr. Annette Stable. This is 5 weeks ago. States that it has helped the pain mostly. Patient is still having some radicular symptoms. Patient was put on nortriptyline. States that the radiation down the leg has completely resolved at this time. Overall feels like she is doing well but has weakness of the back. Was not sent to formal physical therapy and is wondering if this would be beneficial.  Patient is complaining of more of a right knee pain. Has had an history of a total knee replacement previously. No instability. Pain on the medial aspect of the knee. Reviewing patient's chart she has been diagnosed with a pes anserine bursitis previously and has had injections. Patient states that this does hurt more with going up or downstairs. Can be a throbbing sensation after activity. Possibly some swelling noted in sometimes it seems to be warm.     Past Medical History  Diagnosis Date  . Obesity   . Esophageal reflux   . Fibromyalgia   . Asthma   . Allergy     rhinitis  . Sleep apnea, obstructive   . Bronchitis, acute 10/31/2010  . Otitis media of both ears 11/16/2010  . Depression with anxiety 01/08/2011  . Back pain 01/08/2011  . Sinusitis 06/11/2011  . Fatigue 06/11/2011  . Knee pain, right 09/20/2011  . Otitis media 09/20/2011  . Varicose veins of lower limb 10/02/2011  . Otitis media of left ear 09/19/2010  . Motion sickness 12/24/2011  . Sinusitis 12/24/2011  . Nausea & vomiting 12/24/2011  . Heart murmur   . Panic attacks   . Arthritis 02-01-12      osteoarthritis, Back pain, spine surgeries ? retain hardware cervial and  lumbar.  . Anemia 02-01-12    iron absorption problem  . Cancer ([place]) 02-01-12    squamous cell -face  . Allergic state 09/21/2010  . Osteopenia 08/05/2014  . Hyperparathyroidism ([place]) 08/18/2014  . Abdominal pain 08/18/2014   Past Surgical History  Procedure Laterality Date  . Bariatric surgery    . Lumbar disc surgery    . Bladder suspension      complicated by bowel nick/ clolostomy  . Gallbladder surgery  1978  . Appendectomy  1962  . Gastric bypass  2003    Patient also noted "gastric bypass resection - 2004"  . Cervical disc surgery      C-7 in 2004, C-4 and C-5 in 2010  . Colostomy bag      Confirm with patient. Listed under medical conditions on form dated 07/18/09.  [name]      Per medical history form dated 07/18/09.  [name] Hernia repair  2009    Two hernias and abdominal reconstruction  . Ivc filter      prophyllactically- no hx DVT  . Carpal tunnel release  02-01-12    right  . Colostomy reversal  02-01-12  . Abdominal surgery  02-01-12    ABDOMINAL SURGERY  . Vulva surgery  02-01-12    cyst  removal-pt 8 months pregnant  . Total knee arthroplasty  02/11/2012    Procedure: TOTAL KNEE ARTHROPLASTY;  Surgeon: Mauri Pole, MD;  Location: [facility];  Service: Orthopedics;  Laterality: Right;   Social History   Social History  . Marital Status: Married    Spouse Name: N/A  . Number of Children: N/A  . Years of Education: N/A   Social History Main Topics  . Smoking status: Never Smoker   . Smokeless tobacco: Never Used  . Alcohol Use: No     Comment: special occasions  . Drug Use: No  . Sexual Activity: Yes   Other Topics Concern  . None   Social History Narrative   Allergies  Allergen Reactions  . Hydrocodone      nausea and vomiting and headaches.  . Advil [Ibuprofen]     itching  . Cephalexin      Neuropathy  . Codeine     Crazy in the head, bp drops  . Levofloxacin  Itching  . Meperidine Hcl     B/P drops  . Morphine And Related      States" extreme migraines"  . Oxycodone-Acetaminophen Other (See Comments)    Crazy in head, drop in bp  . Sulfonamide Derivatives Nausea And Vomiting    Stomach upset  . Adhesive [Tape] Rash    Latex in adhesive tape. Please use paper tape  . Erythromycin Diarrhea and Rash  . Penicillins Nausea And Vomiting and Rash   Family History  Problem Relation Age of Onset  . Alcohol abuse Mother   . Other Mother     accidental med overdose  . Emphysema Mother     smoked  . Bipolar disorder Mother   . Other Father     CHF  . Diabetes Father   . Cancer Father     throat ca/ smoked  . Alcohol abuse Father   . Stroke Father   . Hypertension Brother   . Hyperlipidemia Brother   . Obesity Brother   . Heart attack Maternal Grandfather   . Heart attack Paternal Grandmother   . Heart attack Paternal Grandfather   . Arthritis Son     psoriatic  . Arthritis Son     psoriatic  . Obesity Son     Past medical history, social, surgical and family history all reviewed in electronic medical record.  No pertanent information unless stated regarding to the chief complaint.   Review of Systems: No headache, visual changes, nausea, vomiting, diarrhea, constipation, dizziness, abdominal pain, skin rash, fevers, chills, night sweats, weight loss, swollen lymph nodes, body aches, joint swelling, muscle aches, chest pain, shortness of breath, mood changes.   Objective Blood pressure 122/72, pulse 66, height 5' (1.524 m), weight 193 lb (87.544 kg), SpO2 94 %.  General: No apparent distress alert and oriented x3 mood and affect normal, dressed appropriately.  HEENT: Pupils equal, extraocular movements intact  Respiratory: Patient's speak in full sentences and does not appear short of breath  Cardiovascular: No lower extremity edema, non tender, no erythema  Skin: Warm dry intact with no signs of infection or rash on extremities or  on axial skeleton.  Abdomen: Soft nontender  Neuro: Cranial nerves II through XII are intact, neurovascularly intact in all extremities with 2+ DTRs and 2+ pulses.  Lymph: No lymphadenopathy of posterior or anterior cervical chain or axillae bilaterally.  Gait antalgic gait MSK:  Non tender with full range of motion and good stability and symmetric strength  and tone of shoulders, elbows, wrist, hip, and ankles bilaterally. Arthritic changes of multiple joints Back Exam:  Inspection: Unremarkable  Motion: Flexion 35 deg, Extension 15 deg, Side Bending to 25 deg bilaterally,  Rotation to 25 deg bilaterally  SLR laying: Tightness of the hamstring on the right sign. Tenderness over the pes anserine. XSLR laying: Negative  Palpable tenderness: Minimal tenderness to palpation in the per spinal musculature FABER: negative. Sensory change: Gross sensation intact to all lumbar and sacral dermatomes.  Reflexes: 2+ at both patellar tendons, 2+ at achilles tendons, Babinski's downgoing.  Strength at foot  Plantar-flexion: 5/5 Dorsi-flexion: 5/5 Eversion: 5/5 Inversion: 5/5  Leg strength  Quad: 5/5 Hamstring: 5/5 Hip flexor: 4/5on right compared to full strength on left Hip abductors: 3+/5 but symmetric  Right knee shows the patient does have a total knee arthroplasty well incision healed. Patient is tender palpation over the pes anserine area. Mild tightness of the hamstring on the right side compared to the contralateral side. Stretching does seem to make it worse.   Impression and Recommendations:     This case required medical decision making of moderate complexity.      Note: This dictation was prepared with Dragon dictation along with smaller phrase technology. Any transcriptional errors that result from this process are unintentional.

## 2015-07-14 NOTE — Patient Instructions (Signed)
[name]  [name] Avoid direct impact on the knee. Use a pillow or something soft to put pressure on it If possible go down stairs or a hill backwards if careful.  Ice after activity and before bed.  pennsaid pinkie amount topically 2 times daily as needed.  Physical therapy will be calling you.  See me again in 6 weeks

## 2015-07-28 ENCOUNTER — Other Ambulatory Visit: Payer: Self-pay | Admitting: Family Medicine

## 2015-07-28 DIAGNOSIS — H02831 Dermatochalasis of right upper eyelid: Secondary | ICD-10-CM | POA: Diagnosis not present

## 2015-07-28 DIAGNOSIS — H02834 Dermatochalasis of left upper eyelid: Secondary | ICD-10-CM | POA: Diagnosis not present

## 2015-07-28 LAB — HM DIABETES EYE EXAM

## 2015-08-02 DIAGNOSIS — M7150 Other bursitis, not elsewhere classified, unspecified site: Secondary | ICD-10-CM | POA: Diagnosis not present

## 2015-08-02 DIAGNOSIS — M5416 Radiculopathy, lumbar region: Secondary | ICD-10-CM | POA: Diagnosis not present

## 2015-08-04 DIAGNOSIS — M5416 Radiculopathy, lumbar region: Secondary | ICD-10-CM | POA: Diagnosis not present

## 2015-08-04 DIAGNOSIS — M7150 Other bursitis, not elsewhere classified, unspecified site: Secondary | ICD-10-CM | POA: Diagnosis not present

## 2015-08-09 DIAGNOSIS — M5416 Radiculopathy, lumbar region: Secondary | ICD-10-CM | POA: Diagnosis not present

## 2015-08-09 DIAGNOSIS — M7150 Other bursitis, not elsewhere classified, unspecified site: Secondary | ICD-10-CM | POA: Diagnosis not present

## 2015-08-11 DIAGNOSIS — M7150 Other bursitis, not elsewhere classified, unspecified site: Secondary | ICD-10-CM | POA: Diagnosis not present

## 2015-08-11 DIAGNOSIS — M5416 Radiculopathy, lumbar region: Secondary | ICD-10-CM | POA: Diagnosis not present

## 2015-08-12 ENCOUNTER — Other Ambulatory Visit: Payer: Self-pay | Admitting: Family Medicine

## 2015-08-16 DIAGNOSIS — M7150 Other bursitis, not elsewhere classified, unspecified site: Secondary | ICD-10-CM | POA: Diagnosis not present

## 2015-08-16 DIAGNOSIS — M5416 Radiculopathy, lumbar region: Secondary | ICD-10-CM | POA: Diagnosis not present

## 2015-08-18 DIAGNOSIS — M7150 Other bursitis, not elsewhere classified, unspecified site: Secondary | ICD-10-CM | POA: Diagnosis not present

## 2015-08-18 DIAGNOSIS — M5416 Radiculopathy, lumbar region: Secondary | ICD-10-CM | POA: Diagnosis not present

## 2015-08-22 ENCOUNTER — Ambulatory Visit (INDEPENDENT_AMBULATORY_CARE_PROVIDER_SITE_OTHER): Payer: Medicare Other

## 2015-08-22 VITALS — BP 124/66 | HR 58 | Resp 16 | Ht 60.0 in | Wt 191.6 lb

## 2015-08-22 DIAGNOSIS — Z7289 Other problems related to lifestyle: Secondary | ICD-10-CM | POA: Diagnosis not present

## 2015-08-22 DIAGNOSIS — Z23 Encounter for immunization: Secondary | ICD-10-CM | POA: Diagnosis not present

## 2015-08-22 DIAGNOSIS — E119 Type 2 diabetes mellitus without complications: Secondary | ICD-10-CM | POA: Diagnosis not present

## 2015-08-22 DIAGNOSIS — E559 Vitamin D deficiency, unspecified: Secondary | ICD-10-CM | POA: Diagnosis not present

## 2015-08-22 DIAGNOSIS — M8580 Other specified disorders of bone density and structure, unspecified site: Secondary | ICD-10-CM

## 2015-08-22 DIAGNOSIS — Z780 Asymptomatic menopausal state: Secondary | ICD-10-CM

## 2015-08-22 DIAGNOSIS — E785 Hyperlipidemia, unspecified: Secondary | ICD-10-CM | POA: Diagnosis not present

## 2015-08-22 DIAGNOSIS — E215 Disorder of parathyroid gland, unspecified: Secondary | ICD-10-CM | POA: Diagnosis not present

## 2015-08-22 DIAGNOSIS — E1169 Type 2 diabetes mellitus with other specified complication: Secondary | ICD-10-CM

## 2015-08-22 DIAGNOSIS — G4733 Obstructive sleep apnea (adult) (pediatric): Secondary | ICD-10-CM

## 2015-08-22 DIAGNOSIS — E669 Obesity, unspecified: Secondary | ICD-10-CM

## 2015-08-22 DIAGNOSIS — Z0000 Encounter for general adult medical examination without abnormal findings: Secondary | ICD-10-CM

## 2015-08-22 DIAGNOSIS — R7989 Other specified abnormal findings of blood chemistry: Secondary | ICD-10-CM

## 2015-08-22 LAB — CBC WITH DIFFERENTIAL/PLATELET
BASOS ABS: 0 10*3/uL (ref 0.0–0.1)
Basophils Relative: 0.4 % (ref 0.0–3.0)
EOS ABS: 0.2 10*3/uL (ref 0.0–0.7)
Eosinophils Relative: 3.8 % (ref 0.0–5.0)
HEMATOCRIT: 33.7 % — AB (ref 36.0–46.0)
Hemoglobin: 11 g/dL — ABNORMAL LOW (ref 12.0–15.0)
LYMPHS PCT: 25.1 % (ref 12.0–46.0)
Lymphs Abs: 1.6 10*3/uL (ref 0.7–4.0)
MCHC: 32.7 g/dL (ref 30.0–36.0)
MCV: 86.4 fl (ref 78.0–100.0)
Monocytes Absolute: 0.6 10*3/uL (ref 0.1–1.0)
Monocytes Relative: 9 % (ref 3.0–12.0)
NEUTROS ABS: 3.8 10*3/uL (ref 1.4–7.7)
NEUTROS PCT: 61.7 % (ref 43.0–77.0)
PLATELETS: 237 10*3/uL (ref 150.0–400.0)
RBC: 3.9 Mil/uL (ref 3.87–5.11)
RDW: 16.1 % — ABNORMAL HIGH (ref 11.5–15.5)
WBC: 6.2 10*3/uL (ref 4.0–10.5)

## 2015-08-22 LAB — COMPREHENSIVE METABOLIC PANEL
ALBUMIN: 3.9 g/dL (ref 3.5–5.2)
ALK PHOS: 98 U/L (ref 39–117)
ALT: 13 U/L (ref 0–35)
AST: 16 U/L (ref 0–37)
BILIRUBIN TOTAL: 0.5 mg/dL (ref 0.2–1.2)
BUN: 11 mg/dL (ref 6–23)
CALCIUM: 9.1 mg/dL (ref 8.4–10.5)
CO2: 29 mEq/L (ref 19–32)
Chloride: 108 mEq/L (ref 96–112)
Creatinine, Ser: 0.72 mg/dL (ref 0.40–1.20)
GFR: 85.48 mL/min (ref >60.00)
GLUCOSE: 90 mg/dL (ref 70–99)
Potassium: 4 mEq/L (ref 3.5–5.1)
Sodium: 143 mEq/L (ref 135–145)
TOTAL PROTEIN: 6.6 g/dL (ref 6.0–8.3)

## 2015-08-22 LAB — LIPID PANEL
CHOLESTEROL: 105 mg/dL (ref 0–200)
HDL: 43.7 mg/dL (ref >39.00)
LDL Cholesterol: 46 mg/dL (ref 0–99)
NonHDL: 61.39
Total CHOL/HDL Ratio: 2
Triglycerides: 78 mg/dL (ref 0.0–149.0)
VLDL: 15.6 mg/dL (ref 0.0–40.0)

## 2015-08-22 LAB — HEPATITIS C ANTIBODY: HCV Ab: NEGATIVE

## 2015-08-22 LAB — HEMOGLOBIN A1C: HEMOGLOBIN A1C: 5.6 % (ref 4.6–6.5)

## 2015-08-22 LAB — VITAMIN D 25 HYDROXY (VIT D DEFICIENCY, FRACTURES): VITD: 26.98 ng/mL — ABNORMAL LOW (ref 30.00–100.00)

## 2015-08-22 NOTE — Assessment & Plan Note (Signed)
Follow w/ Dr. Annamaria Boots. Pt reports compliance w/ CPAP.

## 2015-08-22 NOTE — Progress Notes (Signed)
Subjective:   [name] is a 70 y.o. female who presents for Medicare Annual (Subsequent) preventive examination.  Review of Systems:  No ROS.  Medicare Wellness Visit. Cardiac Risk Factors include: advanced age (>52men, >85 women);dyslipidemia;obesity (BMI >30kg/m2);sedentary lifestyle;diabetes mellitus Sleep patterns:     Home Safety/Smoke Alarms: Feels safe in home, no stairs except 3 on front porch. Lives w/ husband and their dog. Smoke alarms present. Seat Belt Safety/Bike Helmet: Wears seat belt.   Counseling:   [facility], copy of most recent exam requested Dental- Dr. Cherene Julian every 6 months  Female:   Pap- N/A       Mammo- last 10/12/14, Bi-rads Category 1: Negative        Dexa scan-last 05/30/12, osteopenia        CCS-last 09/22/09        Objective:     Vitals: BP 124/66 (BP Location: Right Arm, Patient Position: Sitting, Cuff Size: Large)   Pulse (!) 58   Resp 16   Ht 5' (1.524 m)   Wt 191 lb 9.6 oz (86.9 kg)   SpO2 98%   BMI 37.42 kg/m   Body mass index is 37.42 kg/m.   Tobacco History  Smoking Status  . Never Smoker  Smokeless Tobacco  . Never Used     Counseling given: Not Answered   Past Medical History:  Diagnosis Date  . Abdominal pain 08/18/2014  . Allergic state 09/21/2010  . Allergy    rhinitis  . Anemia 02-01-12   iron absorption problem  . Arthritis 02-01-12   osteoarthritis, Back pain, spine surgeries ? retain hardware cervial and  lumbar.  . Asthma   . Back pain 01/08/2011  . Bronchitis, acute 10/31/2010  . Cancer ([place]) 02-01-12   squamous cell -face  . Depression with anxiety 01/08/2011  . Esophageal reflux   . Fatigue 06/11/2011  . Fibromyalgia   . Heart murmur   . Hyperparathyroidism ([place]) 08/18/2014  . Knee pain, right 09/20/2011  . Motion sickness 12/24/2011  . Nausea & vomiting 12/24/2011  . Obesity   . Osteopenia 08/05/2014  . Otitis media 09/20/2011  . Otitis media of both ears 11/16/2010  . Otitis media of  left ear 09/19/2010  . Panic attacks   . Sinusitis 06/11/2011  . Sinusitis 12/24/2011  . Sleep apnea, obstructive   . Varicose veins of lower limb 10/02/2011   Past Surgical History:  Procedure Laterality Date  . ABDOMINAL SURGERY  02-01-12   ABDOMINAL SURGERY  . APPENDECTOMY  1962  . BARIATRIC SURGERY    . BLADDER SUSPENSION     complicated by bowel nick/ clolostomy  . BLEPHAROPLASTY Bilateral   . CARPAL TUNNEL RELEASE  02-01-12   right  . CATARACT EXTRACTION Bilateral   . CERVICAL DISC SURGERY     C-7 in 2004, C-4 and C-5 in 2010  . colostomy bag     Confirm with patient. Listed under medical conditions on form dated 07/18/09.  [name] COLOSTOMY REVERSAL  02-01-12  . [place]  . GASTRIC BYPASS  2003   Patient also noted "gastric bypass resection - 2004"  . HERNIA REPAIR  2009   Two hernias and abdominal reconstruction  . ivc filter     prophyllactically- no hx DVT  . LUMBAR DISC SURGERY    . LUMBAR DISC SURGERY     L3-L4, Dr. Trenton Gammon  . pannilectomy     Per medical history form dated 07/18/09.  [name] TOTAL KNEE  ARTHROPLASTY  02/11/2012   Procedure: TOTAL KNEE ARTHROPLASTY;  Surgeon: Mauri Pole, MD;  Location: [facility];  Service: Orthopedics;  Laterality: Right;  . VULVA SURGERY  02-01-12   cyst removal-pt 8 months pregnant   Family History  Problem Relation Age of Onset  . Alcohol abuse Mother   . Other Mother     accidental med overdose  . Emphysema Mother     smoked  . Bipolar disorder Mother   . Other Father     CHF  . Diabetes Father   . Cancer Father     throat ca/ smoked  . Alcohol abuse Father   . Stroke Father   . Hypertension Brother   . Hyperlipidemia Brother   . Obesity Brother   . Heart attack Maternal Grandfather   . Heart attack Paternal Grandmother   . Heart attack Paternal Grandfather   . Arthritis Son     psoriatic  . Arthritis Son     psoriatic  . Obesity Son    History  Sexual Activity  . Sexual activity: No    Outpatient  Encounter Prescriptions as of 08/22/2015  Medication Sig  . ALPRAZolam (XANAX) 0.25 MG tablet Take 0.25 mg by mouth daily as needed. For anxiety  . aspirin 81 MG tablet Take 81 mg by mouth daily.  [name] atorvastatin (LIPITOR) 10 MG tablet Take 10 mg by mouth daily.  [name] azelastine (ASTELIN) 0.1 % nasal spray Place 1 spray into both nostrils 2 (two) times daily. Use in each nostril as directed  . bifidobacterium infantis (ALIGN) capsule Take 1 capsule by mouth daily.   . Cholecalciferol (VITAMIN D3) 50000 units CAPS Take 2 capsules by mouth once a week. (Patient taking differently: Take 1 capsule by mouth once a week. )  . DULoxetine (CYMBALTA) 60 MG capsule TAKE ONE CAPSULE BY MOUTH ONCE DAILY  . fluticasone (FLONASE) 50 MCG/ACT nasal spray Place 2 sprays into both nostrils daily.  . methylphenidate (RITALIN) 10 MG tablet Take 1 tablet (10 mg total) by mouth 2 (two) times daily. (Patient taking differently: Take 10 mg by mouth as needed. )  . NON FORMULARY CPAP- set on [employer].  . NONFORMULARY OR COMPOUNDED ITEM Allergy Vaccine 1:10 Given at Home  . nortriptyline (PAMELOR) 10 MG capsule TAKE ONE CAPSULE BY MOUTH AT BEDTIME  . omeprazole (PRILOSEC OTC) 20 MG tablet Take 20 mg by mouth daily.  . ranitidine (ZANTAC) 150 MG tablet 1 tablet qam  . traMADol (ULTRAM) 50 MG tablet Take 1 tablet (50 mg total) by mouth 3 (three) times daily as needed.  . traZODone (DESYREL) 100 MG tablet TAKE ONE-HALF TO ONE TABLET BY MOUTH ONCE DAILY AS NEEDED FOR SLEEP AS DIRECTED  . VESICARE 10 MG tablet TAKE ONE TABLET BY MOUTH ONCE DAILY  . [DISCONTINUED] Azelastine-Fluticasone (DYMISTA) 137-50 MCG/ACT SUSP Place 1-2 puffs into the nose at bedtime. (Patient not taking: Reported on 08/22/2015)   No facility-administered encounter medications on file as of 08/22/2015.     Activities of Daily Living In your present state of health, do you have any difficulty performing the following activities: 08/22/2015  Hearing? N    Vision? N  Difficulty concentrating or making decisions? Y  Walking or climbing stairs? N  Dressing or bathing? N  Doing errands, shopping? N  Preparing Food and eating ? N  Using the Toilet? N  In the past six months, have you accidently leaked urine? Y  Do you have problems with loss of  bowel control? N  Managing your Medications? N  Managing your Finances? N  Housekeeping or managing your Housekeeping? N  Some recent data might be hidden    Patient Care Team: Mosie Lukes, MD as PCP - General (Family Medicine) Lyndal Pulley, DO as Attending Physician (Family Medicine) Deneise Lever, MD as Consulting Physician (Pulmonary Disease) Devra Dopp, MD as Referring Physician (Dermatology) Eldred Manges, MD as Consulting Physician (Obstetrics and Gynecology) Adrian Prows, MD as Consulting Physician (Cardiology) Awanda Mink, MD as Consulting Physician (Ophthalmology)    Assessment:    Physical Assessment deferred to PCP, however pt did c/o fullness in bilateral ears. Normal ear exam.   Pt reports decreased grip strength in her hands for several months and intermittent tremors. Difficulty picking up files at work, some occassional difficulty w/ handwriting, although no issues on exam today. She does have remote history of essential tremor noted from 2012. Follow-up appt scheduled w/ PCP for further evaluation.  Exercise Activities and Dietary recommendations Current Exercise Habits: Structured exercise class, Type of exercise: stretching;walking (Physical Therapy), Time (Minutes): 45, Frequency (Times/Week): 2, Weekly Exercise (Minutes/Week): 90, Intensity: Moderate, Exercise limited by: orthopedic condition(s)  Diet (meal preparation, eat out, water intake, caffeinated beverages, dairy products, fruits and vegetables): Able to prepare meals. Eats out frequently. Likes salads, chicken, 'I love vegetables,' eats bread rarely, no soda. Drinks lemonade, tea, and  water.     Goals    . Increase physical activity      Fall Risk Fall Risk  08/22/2015 08/05/2014  Falls in the past year? No No   Depression Screen PHQ 2/9 Scores 08/22/2015 08/05/2014  PHQ - 2 Score 2 0  PHQ- 9 Score 6 -     Cognitive Testing MMSE - Mini Mental State Exam 08/22/2015  Orientation to time 5  Orientation to Place 5  Registration 3  Attention/ Calculation 5  Recall 3  Language- name 2 objects 2  Language- repeat 1  Language- follow 3 step command 3  Language- read & follow direction 1  Write a sentence 1  Copy design 1  Total score 30    Immunization History  Administered Date(s) Administered  . DTaP 01/23/1968  . Influenza Split 10/24/2010, 10/16/2011  . Influenza Whole 11/08/2008, 10/22/2009  . Influenza, High Dose Seasonal PF 10/23/2014  . Influenza,inj,Quad PF,36+ Mos 11/07/2012  . Pneumococcal Conjugate-13 06/08/2013  . Pneumococcal Polysaccharide-23 10/22/2009, 08/22/2015  . Tdap 10/23/2006, 11/17/2013  . Zoster 01/23/2004   Screening Tests Health Maintenance  Topic Date Due  . Hepatitis C Screening  [birth year]  . FOOT EXAM  02/12/1955  . OPHTHALMOLOGY EXAM  02/12/1955  . HEMOGLOBIN A1C  08/04/2015  . INFLUENZA VACCINE  08/23/2015  . URINE MICROALBUMIN  02/04/2016  . MAMMOGRAM  10/11/2016  . COLONOSCOPY  09/23/2019  . TETANUS/TDAP  11/18/2023  . DEXA SCAN  Completed  . ZOSTAVAX  Completed  . PNA vac Low Risk Adult  Completed      Plan:   Continue to eat heart healthy diet (full of fruits, vegetables, whole grains, lean protein, water--limit salt, fat, and sugar intake) and increase physical activity as tolerated. Continue doing brain stimulating activities (puzzles, reading, adult coloring books, staying active) to keep memory sharp.  Schedule your mammogram and bone density in September.  Follow-up with Dr. Charlett Blake as scheduled. Bring a copy of advanced directives to next visit.  During the course of the visit the patient was educated  and counseled about the following  appropriate screening and preventive services:   Vaccines to include Pneumoccal, Influenza, Hepatitis B, Td, Zostavax, HCV  Cardiovascular Disease  Bone density screening  Diabetes screening  Glaucoma screening  Mammography/PAP  Nutrition counseling   Patient Instructions (the written plan) was given to the patient.   Dorrene German, RN  08/22/2015

## 2015-08-22 NOTE — Patient Instructions (Signed)
Continue to eat heart healthy diet (full of fruits, vegetables, whole grains, lean protein, water--limit salt, fat, and sugar intake) and increase physical activity as tolerated. Continue doing brain stimulating activities (puzzles, reading, adult coloring books, staying active) to keep memory sharp.  Schedule your mammogram and bone density in September.  Follow-up with Dr. Charlett Blake as scheduled.

## 2015-08-22 NOTE — Assessment & Plan Note (Signed)
On rx strength supplement, had to drop to 1 capsule weekly instead of 2 as prescribed d/t GI upset. Check labs today per Dr. Charlett Blake.

## 2015-08-22 NOTE — Progress Notes (Signed)
RN AWV note reviewed. Agree with documention and plan. 

## 2015-08-22 NOTE — Assessment & Plan Note (Signed)
On vitamin D supplement. Due for repeat DEXA scan. Orders placed, pt will have scan done w/ [facility] in September.

## 2015-08-23 ENCOUNTER — Encounter: Payer: Self-pay | Admitting: Family Medicine

## 2015-08-23 DIAGNOSIS — M5416 Radiculopathy, lumbar region: Secondary | ICD-10-CM | POA: Diagnosis not present

## 2015-08-23 DIAGNOSIS — M7150 Other bursitis, not elsewhere classified, unspecified site: Secondary | ICD-10-CM | POA: Diagnosis not present

## 2015-08-23 LAB — PARATHYROID HORMONE, INTACT (NO CA): PTH: 74 pg/mL — ABNORMAL HIGH (ref 14–64)

## 2015-08-23 NOTE — Progress Notes (Signed)
[name] [address], [place] [address] Phone: [phone] Subjective:    I'm seeing this patient by the request  of:  BLYTH, Erline Levine, MD   CC: right hip and back pain   [id number]  [name] is a 70 y.o. female coming in with complaint of right hip and back pain. Patient does have a long past medical history of back issues. Has had multiple fusions and recently did have a microdiscectomy at  L3-L4 by Dr. Annette Stable. Was doing well with the nortriptyline. Patient was sent to PT. Patient states Overall she has improved significantly with formal physical therapy. Feels like this is the best she has ever had. Very happy. No side effects to any medications.  Patient is complaining of more of a right knee pain. Has had an history of a total knee replacement previously. No instability. Pain on the medial aspect of the knee. Reviewing patient's chart she has been diagnosed with a pes anserine bursitis previously and has had injections. None recently. Was sent to PT.  Patient states still has some mild pain. His better. Patient states that it is tolerable. Does not know if she wants an injection or not. Not stopping her from any activities. No pain at night.     Past Medical History:  Diagnosis Date  . Abdominal pain 08/18/2014  . Allergic state 09/21/2010  . Allergy    rhinitis  . Anemia 02-01-12   iron absorption problem  . Arthritis 02-01-12   osteoarthritis, Back pain, spine surgeries ? retain hardware cervial and  lumbar.  . Asthma   . Back pain 01/08/2011  . Bronchitis, acute 10/31/2010  . Cancer ([facility]) 02-01-12   squamous cell -face  . Depression with anxiety 01/08/2011  . Esophageal reflux   . Fatigue 06/11/2011  . Fibromyalgia   . Heart murmur   . Hyperparathyroidism ([facility]) 08/18/2014  . Knee pain, right 09/20/2011  . Motion sickness 12/24/2011  . Nausea & vomiting 12/24/2011  . Obesity   . Osteopenia 08/05/2014  . Otitis media 09/20/2011  .  Otitis media of both ears 11/16/2010  . Otitis media of left ear 09/19/2010  . Panic attacks   . Sinusitis 06/11/2011  . Sinusitis 12/24/2011  . Sleep apnea, obstructive   . Varicose veins of lower limb 10/02/2011   Past Surgical History:  Procedure Laterality Date  . ABDOMINAL SURGERY  02-01-12   ABDOMINAL SURGERY  . APPENDECTOMY  1962  . BARIATRIC SURGERY    . BLADDER SUSPENSION     complicated by bowel nick/ clolostomy  . BLEPHAROPLASTY Bilateral   . CARPAL TUNNEL RELEASE  02-01-12   right  . CATARACT EXTRACTION Bilateral   . CERVICAL DISC SURGERY     C-7 in 2004, C-4 and C-5 in 2010  . colostomy bag     Confirm with patient. Listed under medical conditions on form dated 07/18/09.  [name] COLOSTOMY REVERSAL  02-01-12  . [place]  . GASTRIC BYPASS  2003   Patient also noted "gastric bypass resection - 2004"  . HERNIA REPAIR  2009   Two hernias and abdominal reconstruction  . ivc filter     prophyllactically- no hx DVT  . LUMBAR DISC SURGERY    . LUMBAR DISC SURGERY     L3-L4, Dr. Trenton Gammon  . pannilectomy     Per medical history form dated 07/18/09.  [name] TOTAL KNEE ARTHROPLASTY  02/11/2012   Procedure: TOTAL KNEE ARTHROPLASTY;  Surgeon: Mauri Pole, MD;  Location: [facility];  Service: Orthopedics;  Laterality: Right;  . VULVA SURGERY  02-01-12   cyst removal-pt 8 months pregnant   Social History   Social History  . Marital status: Married    Spouse name: N/A  . Number of children: N/A  . Years of education: N/A   Social History Main Topics  . Smoking status: Never Smoker  . Smokeless tobacco: Never Used  . Alcohol use No     Comment: special occasions  . Drug use: No  . Sexual activity: No   Other Topics Concern  . None   Social History Narrative  . None   Allergies  Allergen Reactions  . Hydrocodone Nausea And Vomiting     nausea and vomiting and headaches  . Advil [Ibuprofen] Hives and Itching  . Cephalexin      Neuropathy  . Codeine     Crazy in  the head, bp drops  . Levofloxacin Itching  . Lyrica [Pregabalin] Other (See Comments)    "Makes me like a zombie. I'm awake but I'm in slow motion."  . Meperidine Hcl     B/P drops  . Morphine And Related      States" extreme migraines"  . Oxycodone-Acetaminophen Other (See Comments)    Crazy in head, drop in bp  . Sulfonamide Derivatives Nausea And Vomiting    Stomach upset  . Adhesive [Tape] Rash    Latex in adhesive tape. Please use paper tape  . Erythromycin Diarrhea and Rash  . Penicillins Nausea And Vomiting and Rash   Family History  Problem Relation Age of Onset  . Alcohol abuse Mother   . Other Mother     accidental med overdose  . Emphysema Mother     smoked  . Bipolar disorder Mother   . Other Father     CHF  . Diabetes Father   . Cancer Father     throat ca/ smoked  . Alcohol abuse Father   . Stroke Father   . Hypertension Brother   . Hyperlipidemia Brother   . Obesity Brother   . Heart attack Maternal Grandfather   . Heart attack Paternal Grandmother   . Heart attack Paternal Grandfather   . Arthritis Son     psoriatic  . Arthritis Son     psoriatic  . Obesity Son     Past medical history, social, surgical and family history all reviewed in electronic medical record.  No pertanent information unless stated regarding to the chief complaint.   Review of Systems: No headache, visual changes, nausea, vomiting, diarrhea, constipation, dizziness, abdominal pain, skin rash, fevers, chills, night sweats, weight loss, swollen lymph nodes, body aches, joint swelling, muscle aches, chest pain, shortness of breath, mood changes.   Objective  Blood pressure 104/70, pulse (!) 59, weight 192 lb (87.1 kg), SpO2 96 %.  General: No apparent distress alert and oriented x3 mood and affect normal, dressed appropriately.  HEENT: Pupils equal, extraocular movements intact  Respiratory: Patient's speak in full sentences and does not appear short of breath  Cardiovascular:  No lower extremity edema, non tender, no erythema  Skin: Warm dry intact with no signs of infection or rash on extremities or on axial skeleton.  Abdomen: Soft nontender  Neuro: Cranial nerves II through XII are intact, neurovascularly intact in all extremities with 2+ DTRs and 2+ pulses.  Lymph: No lymphadenopathy of posterior or anterior cervical chain or axillae bilaterally.  Gait antalgic  gait MSK:  Non tender with full range of motion and good stability and symmetric strength and tone of shoulders, elbows, wrist, hip, and ankles bilaterally. Arthritic changes of multiple joints Back Exam:  Inspection: Unremarkable  Motion: Flexion 35 deg, Extension 15 deg, Side Bending to 25 deg bilaterally,  Rotation to 25 deg bilaterally  SLR laying: Tightness of the hamstring on the right side but improved. XSLR laying: Negative  Palpable tenderness: Minimal tenderness to palpation in the per spinal musculature FABER: negative. Sensory change: Gross sensation intact to all lumbar and sacral dermatomes.  Reflexes: 2+ at both patellar tendons, 2+ at achilles tendons, Babinski's downgoing.  Strength at foot  Plantar-flexion: 5/5 Dorsi-flexion: 5/5 Eversion: 5/5 Inversion: 5/5  Leg strength  Quad: 5/5 Hamstring: 5/5 Hip flexor: 4+/5on right compared to full strength on left Hip abductors: 4 out of 5 which is an improvement. Leg length discrepancy still noted.  Right knee shows the patient does have a total knee arthroplasty well incision healed. Mild tenderness still over the pain is answering area. Improved from previous exam. Still has some tightness of the hamstring.   Impression and Recommendations:     This case required medical decision making of moderate complexity.      Note: This dictation was prepared with Dragon dictation along with smaller phrase technology. Any transcriptional errors that result from this process are unintentional.

## 2015-08-25 ENCOUNTER — Encounter: Payer: Self-pay | Admitting: Family Medicine

## 2015-08-25 ENCOUNTER — Ambulatory Visit (INDEPENDENT_AMBULATORY_CARE_PROVIDER_SITE_OTHER): Payer: Medicare Other | Admitting: Family Medicine

## 2015-08-25 DIAGNOSIS — M2170 Unequal limb length (acquired), unspecified site: Secondary | ICD-10-CM

## 2015-08-25 DIAGNOSIS — M7150 Other bursitis, not elsewhere classified, unspecified site: Secondary | ICD-10-CM | POA: Diagnosis not present

## 2015-08-25 DIAGNOSIS — E559 Vitamin D deficiency, unspecified: Secondary | ICD-10-CM

## 2015-08-25 DIAGNOSIS — M5416 Radiculopathy, lumbar region: Secondary | ICD-10-CM | POA: Diagnosis not present

## 2015-08-25 DIAGNOSIS — M7050 Other bursitis of knee, unspecified knee: Secondary | ICD-10-CM

## 2015-08-25 NOTE — Assessment & Plan Note (Signed)
Still noted. Likely second return the knee replacement. Patient will continue to wear left.

## 2015-08-25 NOTE — Patient Instructions (Signed)
Verbal injections given

## 2015-08-25 NOTE — Assessment & Plan Note (Signed)
Encourage continued supple mentation.

## 2015-08-25 NOTE — Assessment & Plan Note (Signed)
Improved at this time. Patient will finish up with formal physical therapy over the course the next several weeks. We can always do a recertification is necessary. Patient will continue with home exercises. Patient will see me on an as-needed basis.

## 2015-08-25 NOTE — Assessment & Plan Note (Signed)
Patient wanted to hold at this time. Hasn't been almost one year since last injection. We discussed icing regimen. Discussed home exercises. Patient with continue with this. Patient given trial of topical anti-inflammatory's. Patient will follow-up with me on an as-needed basis.

## 2015-09-02 ENCOUNTER — Ambulatory Visit (INDEPENDENT_AMBULATORY_CARE_PROVIDER_SITE_OTHER): Payer: Medicare Other | Admitting: Family Medicine

## 2015-09-02 ENCOUNTER — Encounter: Payer: Self-pay | Admitting: Family Medicine

## 2015-09-02 VITALS — BP 118/82 | HR 56 | Temp 98.2°F | Ht 60.0 in | Wt 189.5 lb

## 2015-09-02 DIAGNOSIS — D509 Iron deficiency anemia, unspecified: Secondary | ICD-10-CM

## 2015-09-02 DIAGNOSIS — E559 Vitamin D deficiency, unspecified: Secondary | ICD-10-CM

## 2015-09-02 DIAGNOSIS — E1169 Type 2 diabetes mellitus with other specified complication: Secondary | ICD-10-CM

## 2015-09-02 DIAGNOSIS — E669 Obesity, unspecified: Secondary | ICD-10-CM

## 2015-09-02 DIAGNOSIS — D649 Anemia, unspecified: Secondary | ICD-10-CM

## 2015-09-02 DIAGNOSIS — E782 Mixed hyperlipidemia: Secondary | ICD-10-CM | POA: Diagnosis not present

## 2015-09-02 DIAGNOSIS — I10 Essential (primary) hypertension: Secondary | ICD-10-CM

## 2015-09-02 DIAGNOSIS — G250 Essential tremor: Secondary | ICD-10-CM

## 2015-09-02 DIAGNOSIS — K625 Hemorrhage of anus and rectum: Secondary | ICD-10-CM

## 2015-09-02 DIAGNOSIS — E119 Type 2 diabetes mellitus without complications: Secondary | ICD-10-CM | POA: Diagnosis not present

## 2015-09-02 DIAGNOSIS — F418 Other specified anxiety disorders: Secondary | ICD-10-CM | POA: Diagnosis not present

## 2015-09-02 DIAGNOSIS — M25561 Pain in right knee: Secondary | ICD-10-CM

## 2015-09-02 DIAGNOSIS — E213 Hyperparathyroidism, unspecified: Secondary | ICD-10-CM

## 2015-09-02 DIAGNOSIS — M8580 Other specified disorders of bone density and structure, unspecified site: Secondary | ICD-10-CM

## 2015-09-02 NOTE — Progress Notes (Signed)
Pre visit review using our clinic review tool, if applicable. No additional management support is needed unless otherwise documented below in the visit note. 

## 2015-09-02 NOTE — Assessment & Plan Note (Signed)
working with Dr Tamala Julian of [facility] and doing better, encouraged topical therapy

## 2015-09-02 NOTE — Assessment & Plan Note (Signed)
hgba1c acceptable, minimize simple carbs. Increase exercise as tolerated. Continue current meds 

## 2015-09-02 NOTE — Patient Instructions (Signed)
Hernia, Adult A hernia is the bulging of an organ or tissue through a weak spot in the muscles of the abdomen (abdominal wall). Hernias develop most often near the navel or groin. There are many kinds of hernias. Common kinds include:  Femoral hernia. This kind of hernia develops under the groin in the upper thigh area.  Inguinal hernia. This kind of hernia develops in the groin or scrotum.  Umbilical hernia. This kind of hernia develops near the navel.  Hiatal hernia. This kind of hernia causes part of the stomach to be pushed up into the chest.  Incisional hernia. This kind of hernia bulges through a scar from an abdominal surgery. CAUSES This condition may be caused by:  Heavy lifting.  Coughing over a long period of time.  Straining to have a bowel movement.  An incision made during an abdominal surgery.  A birth defect (congenital defect).  Excess weight or obesity.  Smoking.  Poor nutrition.  Cystic fibrosis.  Excess fluid in the abdomen.  Undescended testicles. SYMPTOMS Symptoms of a hernia include:  A lump on the abdomen. This is the first sign of a hernia. The lump may become more obvious with standing, straining, or coughing. It may get bigger over time if it is not treated or if the condition causing it is not treated.  Pain. A hernia is usually painless, but it may become painful over time if treatment is delayed. The pain is usually dull and may get worse with standing or lifting heavy objects. Sometimes a hernia gets tightly squeezed in the weak spot (strangulated) or stuck there (incarcerated) and causes additional symptoms. These symptoms may include:  Vomiting.  Nausea.  Constipation.  Irritability. DIAGNOSIS A hernia may be diagnosed with:  A physical exam. During the exam your health care provider may ask you to cough or to make a specific movement, because a hernia is usually more visible when you move.  Imaging tests. These can  include:  X-rays.  Ultrasound.  CT scan. TREATMENT A hernia that is small and painless may not need to be treated. A hernia that is large or painful may be treated with surgery. Inguinal hernias may be treated with surgery to prevent incarceration or strangulation. Strangulated hernias are always treated with surgery, because lack of blood to the trapped organ or tissue can cause it to die. Surgery to treat a hernia involves pushing the bulge back into place and repairing the weak part of the abdomen. HOME CARE INSTRUCTIONS  Avoid straining.  Do not lift anything heavier than 10 lb (4.5 kg).  Lift with your leg muscles, not your back muscles. This helps avoid strain.  When coughing, try to cough gently.  Prevent constipation. Constipation leads to straining with bowel movements, which can make a hernia worse or cause a hernia repair to break down. You can prevent constipation by:  Eating a high-fiber diet that includes plenty of fruits and vegetables.  Drinking enough fluids to keep your urine clear or pale yellow. Aim to drink 6-8 glasses of water per day.  Using a stool softener as directed by your health care provider.  Lose weight, if you are overweight.  Do not use any tobacco products, including cigarettes, chewing tobacco, or electronic cigarettes. If you need help quitting, ask your health care provider.  Keep all follow-up visits as directed by your health care provider. This is important. Your health care provider may need to monitor your condition. SEEK MEDICAL CARE IF:  You have   swelling, redness, and pain in the affected area.  Your bowel habits change. SEEK IMMEDIATE MEDICAL CARE IF:  You have a fever.  You have abdominal pain that is getting worse.  You feel nauseous or you vomit.  You cannot push the hernia back in place by gently pressing on it while you are lying down.  The hernia:  Changes in shape or size.  Is stuck outside the  abdomen.  Becomes discolored.  Feels hard or tender.   This information is not intended to replace advice given to you by your health care provider. Make sure you discuss any questions you have with your health care provider.   Document Released: 01/08/2005 Document Revised: 01/29/2014 Document Reviewed: 11/18/2013 Elsevier Interactive Patient Education 2016 [employer]  

## 2015-09-02 NOTE — Assessment & Plan Note (Signed)
Labs reveal deficiency. Take daily Vitamin D over the counter. If already taking a daily supplement increase by 1000 IU daily and if not start Vitamin D 2000 IU daily.

## 2015-09-02 NOTE — Progress Notes (Signed)
Patient ID: [name], female   DOB: 1945-08-31, 70 y.o.   MRN: [id number]   Subjective:    Patient ID: [name], female    DOB: [birth year], 70 y.o.   MRN: [id number]  Chief Complaint  Patient presents with  . Follow-up    HPI Patient is in today for follow up. She continues to struggle with numerous stressors at home and at work but over all she is managing them. Her tremor is stable. No recent illness. Right knee pain is persistent. Following with [facility]. Denies CP/palp/SOB/HA/congestion/fevers/GI or GU c/o. Taking meds as prescribed  Past Medical History:  Diagnosis Date  . Abdominal pain 08/18/2014  . Allergic state 09/21/2010  . Allergy    rhinitis  . Anemia 02-01-12   iron absorption problem  . Arthritis 02-01-12   osteoarthritis, Back pain, spine surgeries ? retain hardware cervial and  lumbar.  . Asthma   . Back pain 01/08/2011  . Bronchitis, acute 10/31/2010  . Cancer ([place]) 02-01-12   squamous cell -face  . Depression with anxiety 01/08/2011  . Esophageal reflux   . Fatigue 06/11/2011  . Fibromyalgia   . Heart murmur   . Hyperparathyroidism ([place]) 08/18/2014  . Knee pain, right 09/20/2011  . Motion sickness 12/24/2011  . Nausea & vomiting 12/24/2011  . Obesity   . Osteopenia 08/05/2014  . Otitis media 09/20/2011  . Otitis media of both ears 11/16/2010  . Otitis media of left ear 09/19/2010  . Panic attacks   . Sinusitis 06/11/2011  . Sinusitis 12/24/2011  . Sleep apnea, obstructive   . Varicose veins of lower limb 10/02/2011    Past Surgical History:  Procedure Laterality Date  . ABDOMINAL SURGERY  02-01-12   ABDOMINAL SURGERY  . APPENDECTOMY  1962  . BARIATRIC SURGERY    . BLADDER SUSPENSION     complicated by bowel nick/ clolostomy  . BLEPHAROPLASTY Bilateral   . CARPAL TUNNEL RELEASE  02-01-12   right  . CATARACT EXTRACTION Bilateral   . CERVICAL DISC SURGERY     C-7 in 2004, C-4 and C-5 in 2010  . colostomy bag     Confirm with patient. Listed  under medical conditions on form dated 07/18/09.  [name] COLOSTOMY REVERSAL  02-01-12  . EYE SURGERY     b/l cataracts and b/l blepharplasty  . [place]  . GASTRIC BYPASS  2003   Patient also noted "gastric bypass resection - 2004"  . HERNIA REPAIR  2009   Two hernias and abdominal reconstruction  . ivc filter     prophyllactically- no hx DVT  . LUMBAR DISC SURGERY     L1-L3 all had surgery most recently in 2017  . LUMBAR DISC SURGERY     L3-L4, Dr. Trenton Gammon  . pannilectomy     Per medical history form dated 07/18/09.  [name] TOTAL KNEE ARTHROPLASTY  02/11/2012   Procedure: TOTAL KNEE ARTHROPLASTY;  Surgeon: Mauri Pole, MD;  Location: [facility];  Service: Orthopedics;  Laterality: Right;  . VULVA SURGERY  02-01-12   cyst removal-pt 8 months pregnant    Family History  Problem Relation Age of Onset  . Alcohol abuse Mother   . Other Mother     accidental med overdose  . Emphysema Mother     smoked  . Bipolar disorder Mother   . Other Father     CHF  . Diabetes Father   . Cancer Father     throat ca/  smoked  . Alcohol abuse Father   . Stroke Father   . Hypertension Brother   . Hyperlipidemia Brother   . Obesity Brother   . Heart attack Maternal Grandfather   . Heart attack Paternal Grandmother   . Heart attack Paternal Grandfather   . Arthritis Son     psoriatic  . Arthritis Son     psoriatic  . Obesity Son     Social History   Social History  . Marital status: Married    Spouse name: N/A  . Number of children: N/A  . Years of education: N/A   Occupational History  . Not on file.   Social History Main Topics  . Smoking status: Never Smoker  . Smokeless tobacco: Never Used  . Alcohol use No     Comment: special occasions  . Drug use: No  . Sexual activity: No   Other Topics Concern  . Not on file   Social History Narrative  . No narrative on file    Outpatient Medications Prior to Visit  Medication Sig Dispense Refill  . ALPRAZolam (XANAX)  0.25 MG tablet Take 0.25 mg by mouth daily as needed. For anxiety    . aspirin 81 MG tablet Take 81 mg by mouth daily.    [name] atorvastatin (LIPITOR) 10 MG tablet Take 10 mg by mouth daily.    [name] azelastine (ASTELIN) 0.1 % nasal spray Place 1 spray into both nostrils 2 (two) times daily. Use in each nostril as directed    . bifidobacterium infantis (ALIGN) capsule Take 1 capsule by mouth daily.     . Cholecalciferol (VITAMIN D3) 50000 units CAPS Take 2 capsules by mouth once a week. (Patient taking differently: Take 1 capsule by mouth once a week. ) 8 capsule 4  . DULoxetine (CYMBALTA) 60 MG capsule TAKE ONE CAPSULE BY MOUTH ONCE DAILY 90 capsule 0  . fluticasone (FLONASE) 50 MCG/ACT nasal spray Place 2 sprays into both nostrils daily. 16 g 3  . methylphenidate (RITALIN) 10 MG tablet Take 1 tablet (10 mg total) by mouth 2 (two) times daily. (Patient taking differently: Take 10 mg by mouth as needed. ) 60 tablet 0  . NON FORMULARY CPAP- set on [employer].    . NONFORMULARY OR COMPOUNDED ITEM Allergy Vaccine 1:10 Given at Home    . nortriptyline (PAMELOR) 10 MG capsule TAKE ONE CAPSULE BY MOUTH AT BEDTIME 30 capsule 0  . omeprazole (PRILOSEC OTC) 20 MG tablet Take 20 mg by mouth daily.    . ranitidine (ZANTAC) 150 MG tablet 1 tablet qam    . traMADol (ULTRAM) 50 MG tablet Take 1 tablet (50 mg total) by mouth 3 (three) times daily as needed. 90 tablet 0  . traZODone (DESYREL) 100 MG tablet TAKE ONE-HALF TO ONE TABLET BY MOUTH ONCE DAILY AS NEEDED FOR SLEEP AS DIRECTED 90 tablet 0  . VESICARE 10 MG tablet TAKE ONE TABLET BY MOUTH ONCE DAILY 30 tablet 3   No facility-administered medications prior to visit.     Allergies  Allergen Reactions  . Hydrocodone Nausea And Vomiting     nausea and vomiting and headaches  . Advil [Ibuprofen] Hives and Itching  . Cephalexin      Neuropathy  . Codeine     Crazy in the head, bp drops  . Levofloxacin Itching  . Lyrica [Pregabalin] Other (See Comments)     "Makes me like a zombie. I'm awake but I'm in slow motion."  . Meperidine  Hcl     B/P drops  . Morphine And Related      States" extreme migraines"  . Oxycodone-Acetaminophen Other (See Comments)    Crazy in head, drop in bp  . Sulfonamide Derivatives Nausea And Vomiting    Stomach upset  . Adhesive [Tape] Rash    Latex in adhesive tape. Please use paper tape  . Erythromycin Diarrhea and Rash  . Penicillins Nausea And Vomiting and Rash    Review of Systems  Constitutional: Positive for malaise/fatigue. Negative for fever.  HENT: Negative for congestion.   Eyes: Negative for blurred vision.  Respiratory: Negative for shortness of breath.   Cardiovascular: Negative for chest pain, palpitations and leg swelling.  Gastrointestinal: Negative for abdominal pain, blood in stool and nausea.  Genitourinary: Negative for dysuria and frequency.  Musculoskeletal: Positive for joint pain. Negative for falls.  Skin: Negative for rash.  Neurological: Negative for dizziness, loss of consciousness and headaches.  Endo/Heme/Allergies: Negative for environmental allergies.  Psychiatric/Behavioral: Positive for depression. The patient is not nervous/anxious.        Objective:    Physical Exam  Constitutional: She is oriented to person, place, and time. She appears well-developed and well-nourished. No distress.  HENT:  Head: Normocephalic and atraumatic.  Nose: Nose normal.  Eyes: Right eye exhibits no discharge. Left eye exhibits no discharge.  Neck: Normal range of motion. Neck supple.  Cardiovascular: Normal rate and regular rhythm.   Murmur heard. Pulmonary/Chest: Effort normal and breath sounds normal.  Abdominal: Soft. Bowel sounds are normal. There is no tenderness.  Musculoskeletal: She exhibits no edema.  Neurological: She is alert and oriented to person, place, and time.  Skin: Skin is warm and dry.  Psychiatric: She has a normal mood and affect.  Nursing note and vitals  reviewed.   BP 118/82 (BP Location: Left Arm, Patient Position: Sitting, Cuff Size: Large)   Pulse (!) 56   Temp 98.2 F (36.8 C) (Oral)   Ht 5' (1.524 m)   Wt 189 lb 8 oz (86 kg)   SpO2 95%   BMI 37.01 kg/m  Wt Readings from Last 3 Encounters:  09/02/15 189 lb 8 oz (86 kg)  08/25/15 192 lb (87.1 kg)  08/22/15 191 lb 9.6 oz (86.9 kg)     Lab Results  Component Value Date   WBC 6.2 08/22/2015   HGB 11.0 (L) 08/22/2015   HCT 33.7 (L) 08/22/2015   PLT 237.0 08/22/2015   GLUCOSE 90 08/22/2015   CHOL 105 08/22/2015   TRIG 78.0 08/22/2015   HDL 43.70 08/22/2015   LDLCALC 46 08/22/2015   ALT 13 08/22/2015   AST 16 08/22/2015   NA 143 08/22/2015   K 4.0 08/22/2015   CL 108 08/22/2015   CREATININE 0.72 08/22/2015   BUN 11 08/22/2015   CO2 29 08/22/2015   TSH 2.18 02/04/2015   INR 0.98 02/01/2012   HGBA1C 5.6 08/22/2015   MICROALBUR 0.7 02/04/2015    Lab Results  Component Value Date   TSH 2.18 02/04/2015   Lab Results  Component Value Date   WBC 6.2 08/22/2015   HGB 11.0 (L) 08/22/2015   HCT 33.7 (L) 08/22/2015   MCV 86.4 08/22/2015   PLT 237.0 08/22/2015   Lab Results  Component Value Date   NA 143 08/22/2015   K 4.0 08/22/2015   CO2 29 08/22/2015   GLUCOSE 90 08/22/2015   BUN 11 08/22/2015   CREATININE 0.72 08/22/2015   BILITOT 0.5 08/22/2015  ALKPHOS 98 08/22/2015   AST 16 08/22/2015   ALT 13 08/22/2015   PROT 6.6 08/22/2015   ALBUMIN 3.9 08/22/2015   CALCIUM 9.1 08/22/2015   GFR 85.48 08/22/2015   Lab Results  Component Value Date   CHOL 105 08/22/2015   Lab Results  Component Value Date   HDL 43.70 08/22/2015   Lab Results  Component Value Date   LDLCALC 46 08/22/2015   Lab Results  Component Value Date   TRIG 78.0 08/22/2015   Lab Results  Component Value Date   CHOLHDL 2 08/22/2015   Lab Results  Component Value Date   HGBA1C 5.6 08/22/2015       Assessment & Plan:   Problem List Items Addressed This Visit    Obesity     Encouraged DASH diet, decrease po intake and increase exercise as tolerated. Needs 7-8 hours of sleep nightly. Avoid trans fats, eat small, frequent meals every 4-5 hours with lean proteins, complex carbs and healthy fats. Minimize simple carbs, GMO foods.      Diabetes mellitus type 2 in obese (HCC)    hgba1c acceptable, minimize simple carbs. Increase exercise as tolerated. Continue current meds      Relevant Orders   Hemoglobin A1c   Vitamin D deficiency    Labs reveal deficiency. Take daily Vitamin D over the counter. If already taking a daily supplement increase by 1000 IU daily and if not start Vitamin D 2000 IU daily.       Relevant Orders   Vitamin D (25 hydroxy)   Hyperlipidemia, mixed    Tolerating statin, encouraged heart healthy diet, avoid trans fats, minimize simple carbs and saturated fats. Increase exercise as tolerated      Relevant Orders   Lipid panel   Iron deficiency anemia    Recurrent check hemeoccult and Increase leafy greens, consider increased lean red meat and using cast iron cookware. Continue to monitor, report any concerns      Tremor, essential    Stable on current meds, no changes      ESSENTIAL HYPERTENSION, BENIGN    Well controlled, no changes to meds. Encouraged heart healthy diet such as the DASH diet and exercise as tolerated.       Relevant Orders   TSH   CBC   Comprehensive metabolic panel   Depression with anxiety    Struggles with frustration with working and family struggles especially financially      Knee pain, right    working with Dr Tamala [facility] and doing better, encouraged topical therapy      Osteopenia    Encouraged to get adequate exercise, calcium and vitamin d intake      Hyperparathyroidism ([place])   Relevant Orders   PTH, intact (no Ca)    Other Visit Diagnoses    Anemia, unspecified anemia type    -  Primary   Relevant Orders   CBC   CBC   Rectal bleeding       Relevant Orders    Fecal occult blood, imunochemical      I am having Ms. Heward maintain her bifidobacterium infantis, NON FORMULARY, ALPRAZolam, atorvastatin, aspirin, ranitidine, omeprazole, NONFORMULARY OR COMPOUNDED ITEM, Vitamin D3, methylphenidate, fluticasone, traMADol, traZODone, DULoxetine, VESICARE, nortriptyline, and azelastine.  No orders of the defined types were placed in this encounter.    Penni Homans, MD

## 2015-09-02 NOTE — Assessment & Plan Note (Signed)
Tolerating statin, encouraged heart healthy diet, avoid trans fats, minimize simple carbs and saturated fats. Increase exercise as tolerated 

## 2015-09-02 NOTE — Assessment & Plan Note (Signed)
Recurrent check hemeoccult and Increase leafy greens, consider increased lean red meat and using cast iron cookware. Continue to monitor, report any concerns

## 2015-09-02 NOTE — Assessment & Plan Note (Signed)
Struggles with frustration with working and family struggles especially financially

## 2015-09-02 NOTE — Assessment & Plan Note (Signed)
Well controlled, no changes to meds. Encouraged heart healthy diet such as the DASH diet and exercise as tolerated.  °

## 2015-09-08 DIAGNOSIS — M5416 Radiculopathy, lumbar region: Secondary | ICD-10-CM | POA: Diagnosis not present

## 2015-09-08 DIAGNOSIS — M7150 Other bursitis, not elsewhere classified, unspecified site: Secondary | ICD-10-CM | POA: Diagnosis not present

## 2015-09-09 ENCOUNTER — Encounter: Payer: Self-pay | Admitting: Family Medicine

## 2015-09-11 NOTE — Assessment & Plan Note (Signed)
Encouraged DASH diet, decrease po intake and increase exercise as tolerated. Needs 7-8 hours of sleep nightly. Avoid trans fats, eat small, frequent meals every 4-5 hours with lean proteins, complex carbs and healthy fats. Minimize simple carbs, GMO foods. 

## 2015-09-11 NOTE — Assessment & Plan Note (Signed)
Encouraged to get adequate exercise, calcium and vitamin d intake 

## 2015-09-11 NOTE — Assessment & Plan Note (Signed)
Stable on current meds, no changes. 

## 2015-09-12 DIAGNOSIS — M7150 Other bursitis, not elsewhere classified, unspecified site: Secondary | ICD-10-CM | POA: Diagnosis not present

## 2015-09-12 DIAGNOSIS — M5416 Radiculopathy, lumbar region: Secondary | ICD-10-CM | POA: Diagnosis not present

## 2015-09-15 DIAGNOSIS — M7150 Other bursitis, not elsewhere classified, unspecified site: Secondary | ICD-10-CM | POA: Diagnosis not present

## 2015-09-15 DIAGNOSIS — M5416 Radiculopathy, lumbar region: Secondary | ICD-10-CM | POA: Diagnosis not present

## 2015-09-20 DIAGNOSIS — M7150 Other bursitis, not elsewhere classified, unspecified site: Secondary | ICD-10-CM | POA: Diagnosis not present

## 2015-09-20 DIAGNOSIS — M5416 Radiculopathy, lumbar region: Secondary | ICD-10-CM | POA: Diagnosis not present

## 2015-09-22 DIAGNOSIS — M5416 Radiculopathy, lumbar region: Secondary | ICD-10-CM | POA: Diagnosis not present

## 2015-09-22 DIAGNOSIS — M7150 Other bursitis, not elsewhere classified, unspecified site: Secondary | ICD-10-CM | POA: Diagnosis not present

## 2015-09-27 ENCOUNTER — Other Ambulatory Visit (INDEPENDENT_AMBULATORY_CARE_PROVIDER_SITE_OTHER): Payer: Medicare Other

## 2015-09-27 ENCOUNTER — Other Ambulatory Visit: Payer: Self-pay | Admitting: Family Medicine

## 2015-09-27 ENCOUNTER — Other Ambulatory Visit: Payer: Self-pay | Admitting: Physician Assistant

## 2015-09-27 DIAGNOSIS — K625 Hemorrhage of anus and rectum: Secondary | ICD-10-CM | POA: Diagnosis not present

## 2015-09-27 LAB — FECAL OCCULT BLOOD, IMMUNOCHEMICAL: Fecal Occult Bld: NEGATIVE

## 2015-09-27 NOTE — Telephone Encounter (Signed)
eScribe request from [facility]  for refill on Trazodone 100 mg Last filled - 08/30/15, #90x0 Last AEX - 09/02/15 Refill sent per [facility] refill protocol/SLS

## 2015-09-28 DIAGNOSIS — M5416 Radiculopathy, lumbar region: Secondary | ICD-10-CM | POA: Diagnosis not present

## 2015-09-28 DIAGNOSIS — M7150 Other bursitis, not elsewhere classified, unspecified site: Secondary | ICD-10-CM | POA: Diagnosis not present

## 2015-09-29 DIAGNOSIS — M5416 Radiculopathy, lumbar region: Secondary | ICD-10-CM | POA: Diagnosis not present

## 2015-09-29 DIAGNOSIS — M7150 Other bursitis, not elsewhere classified, unspecified site: Secondary | ICD-10-CM | POA: Diagnosis not present

## 2015-10-04 DIAGNOSIS — M7150 Other bursitis, not elsewhere classified, unspecified site: Secondary | ICD-10-CM | POA: Diagnosis not present

## 2015-10-04 DIAGNOSIS — M5416 Radiculopathy, lumbar region: Secondary | ICD-10-CM | POA: Diagnosis not present

## 2015-10-06 ENCOUNTER — Other Ambulatory Visit: Payer: Medicare Other

## 2015-10-18 ENCOUNTER — Ambulatory Visit: Payer: Medicare Other | Admitting: Family Medicine

## 2015-10-24 ENCOUNTER — Ambulatory Visit: Payer: Medicare Other | Admitting: Family Medicine

## 2015-10-25 DIAGNOSIS — M5416 Radiculopathy, lumbar region: Secondary | ICD-10-CM | POA: Diagnosis not present

## 2015-10-25 DIAGNOSIS — Z23 Encounter for immunization: Secondary | ICD-10-CM | POA: Diagnosis not present

## 2015-10-25 DIAGNOSIS — M7150 Other bursitis, not elsewhere classified, unspecified site: Secondary | ICD-10-CM | POA: Diagnosis not present

## 2015-10-27 DIAGNOSIS — M5416 Radiculopathy, lumbar region: Secondary | ICD-10-CM | POA: Diagnosis not present

## 2015-10-27 DIAGNOSIS — M7150 Other bursitis, not elsewhere classified, unspecified site: Secondary | ICD-10-CM | POA: Diagnosis not present

## 2015-11-02 DIAGNOSIS — M5416 Radiculopathy, lumbar region: Secondary | ICD-10-CM | POA: Diagnosis not present

## 2015-11-02 DIAGNOSIS — M7150 Other bursitis, not elsewhere classified, unspecified site: Secondary | ICD-10-CM | POA: Diagnosis not present

## 2015-11-10 DIAGNOSIS — M5416 Radiculopathy, lumbar region: Secondary | ICD-10-CM | POA: Diagnosis not present

## 2015-11-10 DIAGNOSIS — M7150 Other bursitis, not elsewhere classified, unspecified site: Secondary | ICD-10-CM | POA: Diagnosis not present

## 2015-11-15 DIAGNOSIS — M5416 Radiculopathy, lumbar region: Secondary | ICD-10-CM | POA: Diagnosis not present

## 2015-11-15 DIAGNOSIS — M7150 Other bursitis, not elsewhere classified, unspecified site: Secondary | ICD-10-CM | POA: Diagnosis not present

## 2015-11-17 ENCOUNTER — Other Ambulatory Visit: Payer: Self-pay | Admitting: Family Medicine

## 2015-11-17 DIAGNOSIS — E2839 Other primary ovarian failure: Secondary | ICD-10-CM

## 2015-11-18 ENCOUNTER — Telehealth: Payer: Self-pay | Admitting: Family Medicine

## 2015-11-18 DIAGNOSIS — E2839 Other primary ovarian failure: Secondary | ICD-10-CM

## 2015-11-18 NOTE — Telephone Encounter (Signed)
Patient would like to know if she could get her Dexa done at the breast center in [place] or at the [place] office. She states that where she is scheduled now will just take too much time away from her work hours. Please advise.    Patient phone: [phone]

## 2015-11-20 NOTE — Telephone Encounter (Signed)
Please order her a Dexa scan at [facility] for estrogen deficiency

## 2015-11-21 NOTE — Telephone Encounter (Signed)
Referral for Dexa (estrogen def.) has been placed at the [facility] location. PC

## 2015-11-28 ENCOUNTER — Other Ambulatory Visit: Payer: Self-pay | Admitting: Family Medicine

## 2015-11-29 ENCOUNTER — Telehealth: Payer: Self-pay | Admitting: Internal Medicine

## 2015-11-29 NOTE — Telephone Encounter (Signed)
Called pt. Verified address and she request it be mailed. I told her I would get it to her. Nothing further needed.

## 2015-12-05 ENCOUNTER — Telehealth: Payer: Self-pay | Admitting: Internal Medicine

## 2015-12-05 DIAGNOSIS — J309 Allergic rhinitis, unspecified: Secondary | ICD-10-CM | POA: Diagnosis not present

## 2015-12-07 NOTE — Telephone Encounter (Signed)
Allergy Serum Extract Date Mixed: 12/05/15 Vial: 2 Strength: 1:10 Here/Mail/Pick Up: mail Mixed By: tbs Last OV: 01/10/15 Pending OV: 01/13/16 Thought I put vac. in the day it was made, oops, [name] is aware.

## 2015-12-09 DIAGNOSIS — G5751 Tarsal tunnel syndrome, right lower limb: Secondary | ICD-10-CM | POA: Diagnosis not present

## 2015-12-09 DIAGNOSIS — M25561 Pain in right knee: Secondary | ICD-10-CM | POA: Diagnosis not present

## 2015-12-09 DIAGNOSIS — G5781 Other specified mononeuropathies of right lower limb: Secondary | ICD-10-CM | POA: Diagnosis not present

## 2015-12-09 DIAGNOSIS — M1712 Unilateral primary osteoarthritis, left knee: Secondary | ICD-10-CM | POA: Diagnosis not present

## 2015-12-09 DIAGNOSIS — M25562 Pain in left knee: Secondary | ICD-10-CM | POA: Diagnosis not present

## 2015-12-09 DIAGNOSIS — S62617D Displaced fracture of proximal phalanx of left little finger, subsequent encounter for fracture with routine healing: Secondary | ICD-10-CM | POA: Diagnosis not present

## 2016-01-03 ENCOUNTER — Encounter: Payer: Self-pay | Admitting: Family Medicine

## 2016-01-04 ENCOUNTER — Ambulatory Visit (INDEPENDENT_AMBULATORY_CARE_PROVIDER_SITE_OTHER)
Admission: RE | Admit: 2016-01-04 | Discharge: 2016-01-04 | Disposition: A | Discharge status: Home / Self Care | Payer: Medicare Other | Source: Ambulatory Visit | Attending: Family Medicine | Admitting: Family Medicine

## 2016-01-04 DIAGNOSIS — E2839 Other primary ovarian failure: Secondary | ICD-10-CM

## 2016-01-07 ENCOUNTER — Other Ambulatory Visit: Payer: Self-pay | Admitting: Family Medicine

## 2016-01-13 ENCOUNTER — Ambulatory Visit: Payer: Medicare Other | Admitting: Internal Medicine

## 2016-01-18 ENCOUNTER — Other Ambulatory Visit: Payer: Self-pay | Admitting: Family Medicine

## 2016-01-19 ENCOUNTER — Telehealth: Payer: Self-pay | Admitting: Family Medicine

## 2016-01-19 NOTE — Telephone Encounter (Signed)
Pt's husband dropped off  Application for renewal of disability parking placard, pt will pick up when ready, documents were placed in tray at front desk.

## 2016-01-28 DIAGNOSIS — H9201 Otalgia, right ear: Secondary | ICD-10-CM | POA: Diagnosis not present

## 2016-02-29 DIAGNOSIS — L821 Other seborrheic keratosis: Secondary | ICD-10-CM | POA: Diagnosis not present

## 2016-02-29 DIAGNOSIS — Z85828 Personal history of other malignant neoplasm of skin: Secondary | ICD-10-CM | POA: Diagnosis not present

## 2016-02-29 DIAGNOSIS — Z23 Encounter for immunization: Secondary | ICD-10-CM | POA: Diagnosis not present

## 2016-02-29 DIAGNOSIS — D225 Melanocytic nevi of trunk: Secondary | ICD-10-CM | POA: Diagnosis not present

## 2016-02-29 DIAGNOSIS — L570 Actinic keratosis: Secondary | ICD-10-CM | POA: Diagnosis not present

## 2016-02-29 DIAGNOSIS — L409 Psoriasis, unspecified: Secondary | ICD-10-CM | POA: Diagnosis not present

## 2016-02-29 DIAGNOSIS — L814 Other melanin hyperpigmentation: Secondary | ICD-10-CM | POA: Diagnosis not present

## 2016-02-29 DIAGNOSIS — L820 Inflamed seborrheic keratosis: Secondary | ICD-10-CM | POA: Diagnosis not present

## 2016-02-29 DIAGNOSIS — D1800 Hemangioma unspecified site: Secondary | ICD-10-CM | POA: Diagnosis not present

## 2016-03-08 ENCOUNTER — Ambulatory Visit (INDEPENDENT_AMBULATORY_CARE_PROVIDER_SITE_OTHER)
Admission: RE | Admit: 2016-03-08 | Discharge: 2016-03-08 | Disposition: A | Discharge status: Home / Self Care | Payer: Medicare Other | Source: Ambulatory Visit | Attending: Family Medicine | Admitting: Family Medicine

## 2016-03-08 ENCOUNTER — Ambulatory Visit (INDEPENDENT_AMBULATORY_CARE_PROVIDER_SITE_OTHER): Payer: Medicare Other | Admitting: Family Medicine

## 2016-03-08 ENCOUNTER — Ambulatory Visit: Payer: Self-pay

## 2016-03-08 ENCOUNTER — Encounter: Payer: Self-pay | Admitting: Family Medicine

## 2016-03-08 VITALS — BP 138/68 | HR 64 | Ht 59.0 in | Wt 189.0 lb

## 2016-03-08 DIAGNOSIS — M79642 Pain in left hand: Secondary | ICD-10-CM | POA: Diagnosis not present

## 2016-03-08 DIAGNOSIS — M5412 Radiculopathy, cervical region: Secondary | ICD-10-CM

## 2016-03-08 DIAGNOSIS — M542 Cervicalgia: Secondary | ICD-10-CM

## 2016-03-08 MED ORDER — NORTRIPTYLINE HCL 10 MG PO CAPS: 10.0000 mg | ORAL_CAPSULE | Freq: Every day | ORAL | 3 refills | Status: DC

## 2016-03-08 MED ORDER — PREDNISONE 20 MG PO TABS: 40.0000 mg | ORAL_TABLET | Freq: Every day | ORAL | 0 refills | Status: DC

## 2016-03-08 NOTE — Progress Notes (Signed)
[name] [address] [place], [place] [address] Phone: [phone] Subjective:    I'm seeing this patient by the request  of:  Penni Homans, MD   CC: Left hand injury  [id number]  [name] is a 70 y.o. female coming in with complaint of pain injury. Patient states that she did have a fall back in October. Patient fell trying to catch herself. Patient states that she was having hand pain. Diagnosed with more of a fifth metacarpal fracture. Patient was braced for quite some time and then and is now told that she has healed. Continues to have pain. Patient states that she is also noticed that there is some weakness. Patient denies though any numbness. States that it can give her a throbbing sensation. States that she has noticed she's been dropping things on a more regular basis.     Past Medical History:  Diagnosis Date  . Abdominal pain 08/18/2014  . Allergic state 09/21/2010  . Allergy    rhinitis  . Anemia 02-01-12   iron absorption problem  . Arthritis 02-01-12   osteoarthritis, Back pain, spine surgeries ? retain hardware cervial and  lumbar.  . Asthma   . Back pain 01/08/2011  . Bronchitis, acute 10/31/2010  . Cancer ([place]) 02-01-12   squamous cell -face  . Depression with anxiety 01/08/2011  . Esophageal reflux   . Fatigue 06/11/2011  . Fibromyalgia   . Heart murmur   . Hyperparathyroidism ([place]) 08/18/2014  . Knee pain, right 09/20/2011  . Motion sickness 12/24/2011  . Nausea & vomiting 12/24/2011  . Obesity   . Osteopenia 08/05/2014  . Otitis media 09/20/2011  . Otitis media of both ears 11/16/2010  . Otitis media of left ear 09/19/2010  . Panic attacks   . Sinusitis 06/11/2011  . Sinusitis 12/24/2011  . Sleep apnea, obstructive   . Varicose veins of lower limb 10/02/2011   Past Surgical History:  Procedure Laterality Date  . ABDOMINAL SURGERY  02-01-12   ABDOMINAL SURGERY  . APPENDECTOMY  1962  . BARIATRIC SURGERY    . BLADDER  SUSPENSION     complicated by bowel nick/ clolostomy  . BLEPHAROPLASTY Bilateral   . CARPAL TUNNEL RELEASE  02-01-12   right  . CATARACT EXTRACTION Bilateral   . CERVICAL DISC SURGERY     C-7 in 2004, C-4 and C-5 in 2010  . colostomy bag     Confirm with patient. Listed under medical conditions on form dated 07/18/09.  [name] COLOSTOMY REVERSAL  02-01-12  . EYE SURGERY     b/l cataracts and b/l blepharplasty  . [place]  . GASTRIC BYPASS  2003   Patient also noted "gastric bypass resection - 2004"  . HERNIA REPAIR  2009   Two hernias and abdominal reconstruction  . ivc filter     prophyllactically- no hx DVT  . LUMBAR DISC SURGERY     L1-L3 all had surgery most recently in 2017  . LUMBAR DISC SURGERY     L3-L4, Dr. Trenton Gammon  . pannilectomy     Per medical history form dated 07/18/09.  [name] TOTAL KNEE ARTHROPLASTY  02/11/2012   Procedure: TOTAL KNEE ARTHROPLASTY;  Surgeon: Mauri Pole, MD;  Location: [facility];  Service: Orthopedics;  Laterality: Right;  . VULVA SURGERY  02-01-12   cyst removal-pt 8 months pregnant   Social History   Social History  . Marital status: Married    Spouse name:  N/A  . Number of children: N/A  . Years of education: N/A   Social History Main Topics  . Smoking status: Never Smoker  . Smokeless tobacco: Never Used  . Alcohol use No     Comment: special occasions  . Drug use: No  . Sexual activity: No   Other Topics Concern  . Not on file   Social History Narrative  . No narrative on file   Allergies  Allergen Reactions  . Hydrocodone Nausea And Vomiting     nausea and vomiting and headaches  . Advil [Ibuprofen] Hives and Itching  . Cephalexin      Neuropathy  . Codeine     Crazy in the head, bp drops  . Levofloxacin Itching  . Lyrica [Pregabalin] Other (See Comments)    "Makes me like a zombie. I'm awake but I'm in slow motion."  . Meperidine Hcl     B/P drops  . Morphine And Related      States" extreme migraines"  .  Oxycodone-Acetaminophen Other (See Comments)    Crazy in head, drop in bp  . Sulfonamide Derivatives Nausea And Vomiting    Stomach upset  . Adhesive [Tape] Rash    Latex in adhesive tape. Please use paper tape  . Erythromycin Diarrhea and Rash  . Penicillins Nausea And Vomiting and Rash   Family History  Problem Relation Age of Onset  . Alcohol abuse Mother   . Other Mother     accidental med overdose  . Emphysema Mother     smoked  . Bipolar disorder Mother   . Other Father     CHF  . Diabetes Father   . Cancer Father     throat ca/ smoked  . Alcohol abuse Father   . Stroke Father   . Hypertension Brother   . Hyperlipidemia Brother   . Obesity Brother   . Heart attack Maternal Grandfather   . Heart attack Paternal Grandmother   . Heart attack Paternal Grandfather   . Arthritis Son     psoriatic  . Arthritis Son     psoriatic  . Obesity Son     Past medical history, social, surgical and family history all reviewed in electronic medical record.  No pertanent information unless stated regarding to the chief complaint.   Review of Systems: No headache, visual changes, nausea, vomiting, diarrhea, constipation, dizziness, abdominal pain, skin rash, fevers, chills, night sweats, weight loss, swollen lymph nodes, body aches, joint swelling, muscle aches, chest pain, shortness of breath, mood changes.   Objective  There were no vitals taken for this visit.  Systems examined below as of 03/08/16 General: NAD A&O x3 mood, affect normal  HEENT: Pupils equal, extraocular movements intact no nystagmus Respiratory: not short of breath at rest or with speaking Cardiovascular: No lower extremity edema, non tender Skin: Warm dry intact with no signs of infection or rash on extremities or on axial skeleton. Abdomen: Soft nontender, no masses Neuro: Cranial nerves  intact, neurovascularly intact in all extremities with 2+ DTRs and 2+ pulses. Lymph: No lymphadenopathy appreciated  today  Gait normal with good balance and coordination.  MSK: Non tender with full range of motion and good stability and symmetric strength and tone of shoulders, elbows, wrist,  knee hips and ankles bilaterally.  Arthritic changes of multiple joints. Patient does have a replacement.  Hand exam shows the patient does have some mild deformity of the fifth metacarpal. Minor tenderness to palpation. Patient does have  lack of grip strength. Neurovascular intact distally. Mild atrophy of the hyperthenar eminence  Neck: Inspection unremarkable. No palpable stepoffs. Positive Spurling's maneuver. Full neck range of motion Grip strength and sensation normal in bilateral hands Decreased strength in the C7-T1 distribution No sensory change to C4 to T1 Negative Hoffman sign bilaterally Reflexes normal  Limited Musculoskeletal ultrasound was performed and interpreted by Lyndal Pulley  Ultrasound of patient's left hand shows the patient does have a healing fracture of the fourth and fifth metacarpal.  Impression: Healing fractures of the fourth and fifth metacarpal    Impression and Recommendations:     This case required medical decision making of moderate complexity.      Note: This dictation was prepared with Dragon dictation along with smaller phrase technology. Any transcriptional errors that result from this process are unintentional.

## 2016-03-08 NOTE — Patient Instructions (Addendum)
[name]  Ice 20 minutes 2 times daily. Usually after activity and before bed. Get xray downstairs.  Prednisone daily for 7 days.  Nortriptyline at night again See me again in 10 to 14 days to make sure doing better.

## 2016-03-08 NOTE — Assessment & Plan Note (Signed)
Patient does have some weakness mostly on the left hand. Seems to be in the C7 T8 distribution. I believe that is why patient has the hyperthenar eminence wasting as well. Discussed with patient at great length. Patient will put on prednisone as well as start the nortriptyline. We discussed with her that worsening weakness we may need advance imaging. X-rays ordered today to rule out any occult fracture. Follow-up again in 10-20 days.

## 2016-03-14 ENCOUNTER — Other Ambulatory Visit (INDEPENDENT_AMBULATORY_CARE_PROVIDER_SITE_OTHER): Payer: Medicare Other

## 2016-03-14 DIAGNOSIS — E559 Vitamin D deficiency, unspecified: Secondary | ICD-10-CM

## 2016-03-14 DIAGNOSIS — E669 Obesity, unspecified: Secondary | ICD-10-CM

## 2016-03-14 DIAGNOSIS — E1169 Type 2 diabetes mellitus with other specified complication: Secondary | ICD-10-CM | POA: Diagnosis not present

## 2016-03-14 DIAGNOSIS — E782 Mixed hyperlipidemia: Secondary | ICD-10-CM | POA: Diagnosis not present

## 2016-03-14 DIAGNOSIS — D649 Anemia, unspecified: Secondary | ICD-10-CM | POA: Diagnosis not present

## 2016-03-14 DIAGNOSIS — E213 Hyperparathyroidism, unspecified: Secondary | ICD-10-CM | POA: Diagnosis not present

## 2016-03-14 DIAGNOSIS — I10 Essential (primary) hypertension: Secondary | ICD-10-CM | POA: Diagnosis not present

## 2016-03-14 LAB — COMPREHENSIVE METABOLIC PANEL
ALBUMIN: 4 g/dL (ref 3.5–5.2)
ALK PHOS: 81 U/L (ref 39–117)
ALT: 18 U/L (ref 0–35)
AST: 14 U/L (ref 0–37)
BILIRUBIN TOTAL: 0.5 mg/dL (ref 0.2–1.2)
BUN: 22 mg/dL (ref 6–23)
CALCIUM: 8.8 mg/dL (ref 8.4–10.5)
CO2: 28 mEq/L (ref 19–32)
CREATININE: 0.77 mg/dL (ref 0.40–1.20)
Chloride: 109 mEq/L (ref 96–112)
GFR: 78.98 mL/min (ref >60.00)
Glucose, Bld: 91 mg/dL (ref 70–99)
Potassium: 3.6 mEq/L (ref 3.5–5.1)
Sodium: 143 mEq/L (ref 135–145)
TOTAL PROTEIN: 6.3 g/dL (ref 6.0–8.3)

## 2016-03-14 LAB — LIPID PANEL
CHOLESTEROL: 138 mg/dL (ref 0–200)
HDL: 56.5 mg/dL (ref >39.00)
LDL Cholesterol: 68 mg/dL (ref 0–99)
NonHDL: 81.41
TRIGLYCERIDES: 67 mg/dL (ref 0.0–149.0)
Total CHOL/HDL Ratio: 2
VLDL: 13.4 mg/dL (ref 0.0–40.0)

## 2016-03-14 LAB — HEMOGLOBIN A1C: HEMOGLOBIN A1C: 5.8 % (ref 4.6–6.5)

## 2016-03-14 LAB — CBC
HCT: 33.6 % — ABNORMAL LOW (ref 36.0–46.0)
Hemoglobin: 11.1 g/dL — ABNORMAL LOW (ref 12.0–15.0)
MCHC: 33.1 g/dL (ref 30.0–36.0)
MCV: 87.5 fl (ref 78.0–100.0)
PLATELETS: 280 10*3/uL (ref 150.0–400.0)
RBC: 3.84 Mil/uL — ABNORMAL LOW (ref 3.87–5.11)
RDW: 15 % (ref 11.5–15.5)
WBC: 10.1 10*3/uL (ref 4.0–10.5)

## 2016-03-14 LAB — TSH: TSH: 3.21 u[IU]/mL (ref 0.35–4.50)

## 2016-03-14 LAB — VITAMIN D 25 HYDROXY (VIT D DEFICIENCY, FRACTURES): VITD: 19.51 ng/mL — AB (ref 30.00–100.00)

## 2016-03-15 ENCOUNTER — Other Ambulatory Visit: Payer: Self-pay | Admitting: Family Medicine

## 2016-03-15 LAB — PARATHYROID HORMONE, INTACT (NO CA): PTH: 90 pg/mL — ABNORMAL HIGH (ref 14–64)

## 2016-03-19 NOTE — Progress Notes (Signed)
[name] [place], [place] [address] Phone: [phone] Subjective:    I'm seeing this patient by the request  of:  Penni Homans, MD   CC: Left hand Weakness follow-up  [id number]  [name] is a 71 y.o. female coming in with complaint of pain injury. Patient was found to have a fourth and fifth metacarpal fracture. Was still having weakness of the last follow-up. Was found and patient was having more radicular symptoms from the neck. Patient was started on prednisone as well as nortriptyline at night. Patient states that she is feeling 60-70% better. Has noticed some mild increase in strength as well. Patient states that the numbness is no longer constant as well. Patient has been able to increase her activity. Patient denies any new symptoms at this time.  Patient did have x-rays at last exam. X-rays show severe osteophytic changes of multiple areas of the cervical spine including bone spurring around the ACDF.    Past Medical History:  Diagnosis Date  . Abdominal pain 08/18/2014  . Allergic state 09/21/2010  . Allergy    rhinitis  . Anemia 02-01-12   iron absorption problem  . Arthritis 02-01-12   osteoarthritis, Back pain, spine surgeries ? retain hardware cervial and  lumbar.  . Asthma   . Back pain 01/08/2011  . Bronchitis, acute 10/31/2010  . Cancer ([place]) 02-01-12   squamous cell -face  . Depression with anxiety 01/08/2011  . Esophageal reflux   . Fatigue 06/11/2011  . Fibromyalgia   . Heart murmur   . Hyperparathyroidism ([place]) 08/18/2014  . Knee pain, right 09/20/2011  . Motion sickness 12/24/2011  . Nausea & vomiting 12/24/2011  . Obesity   . Osteopenia 08/05/2014  . Otitis media 09/20/2011  . Otitis media of both ears 11/16/2010  . Otitis media of left ear 09/19/2010  . Panic attacks   . Sinusitis 06/11/2011  . Sinusitis 12/24/2011  . Sleep apnea, obstructive   . Varicose veins of lower limb 10/02/2011   Past Surgical  History:  Procedure Laterality Date  . ABDOMINAL SURGERY  02-01-12   ABDOMINAL SURGERY  . APPENDECTOMY  1962  . BARIATRIC SURGERY    . BLADDER SUSPENSION     complicated by bowel nick/ clolostomy  . BLEPHAROPLASTY Bilateral   . CARPAL TUNNEL RELEASE  02-01-12   right  . CATARACT EXTRACTION Bilateral   . CERVICAL DISC SURGERY     C-7 in 2004, C-4 and C-5 in 2010  . colostomy bag     Confirm with patient. Listed under medical conditions on form dated 07/18/09.  [name] COLOSTOMY REVERSAL  02-01-12  . EYE SURGERY     b/l cataracts and b/l blepharplasty  . [place]  . GASTRIC BYPASS  2003   Patient also noted "gastric bypass resection - 2004"  . HERNIA REPAIR  2009   Two hernias and abdominal reconstruction  . ivc filter     prophyllactically- no hx DVT  . LUMBAR DISC SURGERY     L1-L3 all had surgery most recently in 2017  . LUMBAR DISC SURGERY     L3-L4, Dr. Trenton Gammon  . pannilectomy     Per medical history form dated 07/18/09.  [name] TOTAL KNEE ARTHROPLASTY  02/11/2012   Procedure: TOTAL KNEE ARTHROPLASTY;  Surgeon: Mauri Pole, MD;  Location: [facility];  Service: Orthopedics;  Laterality: Right;  . VULVA SURGERY  02-01-12   cyst removal-pt 8 months pregnant  Social History   Social History  . Marital status: Married    Spouse name: N/A  . Number of children: N/A  . Years of education: N/A   Social History Main Topics  . Smoking status: Never Smoker  . Smokeless tobacco: Never Used  . Alcohol use No     Comment: special occasions  . Drug use: No  . Sexual activity: No   Other Topics Concern  . None   Social History Narrative  . None   Allergies  Allergen Reactions  . Hydrocodone Nausea And Vomiting     nausea and vomiting and headaches  . Advil [Ibuprofen] Hives and Itching  . Cephalexin      Neuropathy  . Codeine     Crazy in the head, bp drops  . Levofloxacin Itching  . Lyrica [Pregabalin] Other (See Comments)    "Makes me like a zombie. I'm awake  but I'm in slow motion."  . Meperidine Hcl     B/P drops  . Morphine And Related      States" extreme migraines"  . Oxycodone-Acetaminophen Other (See Comments)    Crazy in head, drop in bp  . Sulfonamide Derivatives Nausea And Vomiting    Stomach upset  . Adhesive [Tape] Rash    Latex in adhesive tape. Please use paper tape  . Erythromycin Diarrhea and Rash  . Penicillins Nausea And Vomiting and Rash   Family History  Problem Relation Age of Onset  . Alcohol abuse Mother   . Other Mother     accidental med overdose  . Emphysema Mother     smoked  . Bipolar disorder Mother   . Other Father     CHF  . Diabetes Father   . Cancer Father     throat ca/ smoked  . Alcohol abuse Father   . Stroke Father   . Hypertension Brother   . Hyperlipidemia Brother   . Obesity Brother   . Heart attack Maternal Grandfather   . Heart attack Paternal Grandmother   . Heart attack Paternal Grandfather   . Arthritis Son     psoriatic  . Arthritis Son     psoriatic  . Obesity Son     Past medical history, social, surgical and family history all reviewed in electronic medical record.  No pertanent information unless stated regarding to the chief complaint.   Review of Systems: No headache, visual changes, nausea, vomiting, diarrhea, constipation, dizziness, abdominal pain, skin rash, fevers, chills, night sweats, weight loss, swollen lymph nodes, body aches, joint swelling,chest pain, shortness of breath, mood changes. Positive muscle aches   Objective  Blood pressure 122/82, pulse 75, height 4\' 11"  (1.499 m), weight 199 lb (90.3 kg), SpO2 99 %.  Systems examined below as of 03/20/16 General: NAD A&O x3 mood, affect normal  HEENT: Pupils equal, extraocular movements intact no nystagmus Respiratory: not short of breath at rest or with speaking Cardiovascular: No lower extremity edema, non tender Skin: Warm dry intact with no signs of infection or rash on extremities or on axial  skeleton. Abdomen: Soft nontender, no masses Neuro: Cranial nerves  intact, neurovascularly intact in all extremities with 2+ DTRs and 2+ pulses. Lymph: No lymphadenopathy appreciated today  Gait normal with good balance and coordination.   MSK: Non tender with full range of motion and good stability and symmetric strength and tone of shoulders, elbows, wrist,  knee hips and ankles bilaterally.  Arthritic changes of multiple joints. Patient does have a replacement.  Hand exam shows the patient does have significant less pain over the fourth and fifth metacarpals and significantly decrease in the deformity that was noted previously.  Neck: Inspection unremarkable. No palpable stepoffs. Negative Spurling's maneuver. Mild limitation and right-sided and left-sided side bending Continue mild decrease in strength of 4 out of 5 in the C7-T1 distribution compared to the contralateral side. No sensory change to C4 to T1 Negative Hoffman sign bilaterally Reflexes normal  Procedure note [id number]; 15 minutes spent for Therapeutic exercises as stated in above notes.  This included exercises focusing on stretching, strengthening, with significant focus on eccentric aspects.  Abduction, abduction, grip strength as well as intrinsic muscle eccentric exercises given. Proper technique shown and discussed handout in great detail with ATC.  All questions were discussed and answered.      Impression and Recommendations:     This case required medical decision making of moderate complexity.      Note: This dictation was prepared with Dragon dictation along with smaller phrase technology. Any transcriptional errors that result from this process are unintentional.

## 2016-03-20 ENCOUNTER — Encounter: Payer: Self-pay | Admitting: Family Medicine

## 2016-03-20 ENCOUNTER — Ambulatory Visit (INDEPENDENT_AMBULATORY_CARE_PROVIDER_SITE_OTHER): Payer: Medicare Other | Admitting: Family Medicine

## 2016-03-20 DIAGNOSIS — M5412 Radiculopathy, cervical region: Secondary | ICD-10-CM | POA: Diagnosis not present

## 2016-03-20 NOTE — Patient Instructions (Signed)
[name]  New exercises for the hand Ice when you need it Continue the nortriptyline at night See me again in 4-6 weeks to make sure you are making progress.

## 2016-03-20 NOTE — Assessment & Plan Note (Signed)
Significantly improved at this time. Given home exercises for strengthening. Continue the nortriptyline. Monitor patient's blood sugars and patient will continue to watch. We discussed icing regimen. Patient come back again in 4-6 weeks to make sure that strength continues to improve.

## 2016-03-30 ENCOUNTER — Other Ambulatory Visit: Payer: Self-pay | Admitting: Obstetrics and Gynecology

## 2016-03-30 ENCOUNTER — Other Ambulatory Visit: Payer: Self-pay | Admitting: Family Medicine

## 2016-03-30 DIAGNOSIS — Z1231 Encounter for screening mammogram for malignant neoplasm of breast: Secondary | ICD-10-CM

## 2016-03-31 ENCOUNTER — Other Ambulatory Visit: Payer: Self-pay | Admitting: Family Medicine

## 2016-04-02 ENCOUNTER — Ambulatory Visit: Payer: Medicare Other | Admitting: Internal Medicine

## 2016-04-10 ENCOUNTER — Encounter: Payer: Self-pay | Admitting: Internal Medicine

## 2016-04-10 ENCOUNTER — Ambulatory Visit (INDEPENDENT_AMBULATORY_CARE_PROVIDER_SITE_OTHER): Payer: Medicare Other | Admitting: Internal Medicine

## 2016-04-10 VITALS — BP 140/82 | HR 85 | Ht 59.0 in | Wt 196.0 lb

## 2016-04-10 DIAGNOSIS — G47419 Narcolepsy without cataplexy: Secondary | ICD-10-CM

## 2016-04-10 DIAGNOSIS — J3089 Other allergic rhinitis: Secondary | ICD-10-CM | POA: Diagnosis not present

## 2016-04-10 DIAGNOSIS — G4733 Obstructive sleep apnea (adult) (pediatric): Secondary | ICD-10-CM

## 2016-04-10 DIAGNOSIS — J302 Other seasonal allergic rhinitis: Secondary | ICD-10-CM

## 2016-04-10 MED ORDER — AZELASTINE HCL 0.1 % NA SOLN: NASAL | 12 refills | Status: DC

## 2016-04-10 MED ORDER — FLUTICASONE PROPIONATE 50 MCG/ACT NA SUSP: NASAL | 12 refills | Status: DC

## 2016-04-10 MED ORDER — IPRATROPIUM BROMIDE 0.06 % NA SOLN: NASAL | 12 refills | Status: DC

## 2016-04-10 MED ORDER — AMPHETAMINE-DEXTROAMPHET ER 25 MG PO CP24: 25.0000 mg | ORAL_CAPSULE | ORAL | 0 refills | Status: DC

## 2016-04-10 NOTE — Assessment & Plan Note (Signed)
She is going to try again with ipratropium nasal spray. We discussed comparison with Flonase and Astelin nasal sprays, nonsedating antihistamines.

## 2016-04-10 NOTE — Assessment & Plan Note (Signed)
She describes adequate sleep hygiene and download confirms good control of sleep apnea with CPAP. Residual daytime sleepiness without cataplexy. Difficult to exclude effect of depression and effect of sedating side effects of [name] antidepressants. Plan-try replacing Ritalin with Adderall 25 mg XR once daily

## 2016-04-10 NOTE — Patient Instructions (Signed)
Order- DME [facility]- continue CPAP auto 5-20, mask of choice, humidifier, supplies, AirView   Dx OSA                          She needs new mask please  Script printed in case you want to re-try ipratropium nasal spray for drainage  Scripts printed for flonase and astelin nasal sprays  Script printed for Adderall 25 XR to try one daily if needed instead of ritalin. See if it works better.  You might also try "Throat Coat tea" from grocery store  [place] to use Biotene for dry mouth as needed

## 2016-04-10 NOTE — Progress Notes (Addendum)
Subjective:    Patient ID: [name], female    DOB: [birth year], 71 y.o.   MRN: [id number]  HPI female never smoker followed for allergic rhinitis, asthma, OSA, Narcolepsy, complicated by GERD, DM, HBP, obesity/ bariatric surgery, pacemaker PFT 11/24/07- WNL FEV1/FVC 0.87 NPSG 03/23/1999- AHI 156/ hr, weight 202lbs before bariatric surgery, CPAP recently 12. [facility] Sleep screen 07/24/12- AHI 2.0/ hr, weight 209 lbs NPSG 01/25/13- AHI 7.6/ hr, mild OSA, weight 213 lbs. MSLT 01/26/13- mean latency 0.9 minutes, SOREM 1/5 naps. Pathologic daytime sleepiness consistent with narcolepsy or idiopathic, but nonspecific because she had taken Klonopin and Lamictal the night before, Cymbalta and Xyzal the day of the test.  ----------------------------------------------------------------------------------.  01/10/2015-71 year old female never smoker followed for allergic rhinitis, asthma, OSA, Narcolepsy, complicated by GERD, DM, HBP, obesity, pacemaker Allergy Vaccine 1:10 G0 CPAP 13/[facility] FOLLOWS FOR: Still on vaccine and no reactions; having hard time with clearing thraot and PND-take Sudafed and it goes away(does not take on regular basis due to BP issues). Continues to wear CPAP machine nightly through [facility](current DME).  04/10/2816-71 year old female never smoker followed for allergic rhinitis, asthma, OSA/Narcolepsy, complicated by GERD, DM, HBP, obesity/bariatric surgery, pacemaker Allergy vaccine program at this office discontinued 01/21/2017 CPAP auto 5-20 [facility] FOLLOWS FOR: Pt continues to wear CPAP ; DL attached. DME [facility]. Pt needs new mask now.  Complains that she stays sleepy, fights falling asleep at work. Previous MSLT was limited by the fact that she had taken several medications current probable of affecting sleepiness at the time of the study despite instruction. She is confident she gets at least 7 or 7-1/2 hours of sleep each night with little sleep disturbance.  Download confirms 98% for our compliance with AHI 1.3/hour. She takes trazodone 100 mg at bedtime with nortriptyline 10 mg and continue Cymbalta. Daily Astelin nasal spray and Xyzal. I explained all of these might contribute to daytime sleepiness. Coffee only in the morning. She tries to avoid daytime naps so she can sleep well at night. No symptoms suggestive of cataplexy. TSH was normal per her PCP. Full face mask. Dry mouth in the mornings. Aware of Biotene availability. She stopped using Ritalin 10 mg twice daily because it was ineffective. No longer recognizes asthma with no wheezing and no need for inhalers. Clear postnasal drainage increased with spring pollen season now.   Review of Systems-see HPI  Constitutional:   No-   weight loss, night sweats, fevers, chills, +fatigue, lassitude. HEENT:   No-  headaches, difficulty swallowing, tooth/dental problems, sore throat,       No-  sneezing, itching, ear ache,  nasal congestion, +post nasal drip,  CV:  No-   chest pain, orthopnea, PND, swelling in lower extremities, anasarca, dizziness, palpitations Resp: No-   shortness of breath with exertion or at rest.              No- productive cough,   non-productive cough,  No-  coughing up of blood.              No-   change in color of mucus.  No- wheezing.   Skin: No-   rash or lesions. GI:  No-   heartburn, indigestion, abdominal pain, nausea, vomiting, GU:  MS:  No-   joint pain or swelling.   Neuro- nothing unusual Psych:  No- change in mood or affect. No depression or anxiety.  No memory loss.    Objective:   Physical Exam General- Alert, Oriented, Affect-appropriate, Distress- none  acute  Very overweight Skin- rash-none, lesions- none, excoriation-none Lymphadenopathy- none Head- atraumatic            Eyes- Gross vision intact, PERRLA, conjunctivae clear, with watery thin secretions            Ears- Hearing, canals normal, TMs normal            Nose- Clear, No- Septal dev, mucus,  polyps, erosion, perforation,             Throat- Mallampati III , mucosa clear , drainage- none, tonsils- atrophic Neck- flexible , trachea midline, no stridor , thyroid nl, carotid no bruit Chest - symmetrical excursion , unlabored           Heart/CV- RRR , +trace systolic murmur? TI or AS , no gallop  , no rub, nl s1               s2                           - JVD- none , edema- none, stasis changes- none, varices- right calf           Lung-  Clear chest, Cough-none, wheeze- none , dullness-none, rub- none           Chest wall-  Abd-  Br/ Gen/ Rectal- Not done, not indicated Extrem- cyanosis- none, clubbing, none, atrophy- none, strength- nl Neuro- grossly intact to observation  Assessment & Plan:

## 2016-04-10 NOTE — Assessment & Plan Note (Signed)
She describes and download confirms good compliance and control. She will continue current AutoPap/[facility]

## 2016-04-11 ENCOUNTER — Other Ambulatory Visit: Payer: Self-pay | Admitting: Family Medicine

## 2016-04-18 ENCOUNTER — Ambulatory Visit (INDEPENDENT_AMBULATORY_CARE_PROVIDER_SITE_OTHER): Payer: Medicare Other | Admitting: Family

## 2016-04-18 VITALS — BP 124/72 | HR 60 | Temp 98.8°F | Resp 16 | Ht 59.0 in | Wt 198.8 lb

## 2016-04-18 DIAGNOSIS — M79642 Pain in left hand: Secondary | ICD-10-CM

## 2016-04-18 DIAGNOSIS — M5090 Cervical disc disorder, unspecified, unspecified cervical region: Secondary | ICD-10-CM

## 2016-04-18 DIAGNOSIS — R220 Localized swelling, mass and lump, head: Secondary | ICD-10-CM

## 2016-04-18 DIAGNOSIS — M5412 Radiculopathy, cervical region: Secondary | ICD-10-CM

## 2016-04-18 MED ORDER — DOXYCYCLINE HYCLATE 100 MG PO TABS: 100.0000 mg | ORAL_TABLET | Freq: Two times a day (BID) | ORAL | 0 refills | Status: DC

## 2016-04-18 NOTE — Progress Notes (Signed)
Pre visit review using our clinic review tool, if applicable. No additional management support is needed unless otherwise documented below in the visit note. 

## 2016-04-18 NOTE — Patient Instructions (Addendum)
Begin doxycycline twice daily for your facial swelling. Call us if you develop increased pain, swelling, redness or fever.  You will be contacted about your referrals.

## 2016-04-18 NOTE — Progress Notes (Addendum)
Subjective:    Patient ID: [name], female    DOB: 12/30/1945, 71 y.o.   MRN: [id number]  HPI  [name] is a 71 yr old female who presents today with c/o pain/swelling of the right side of her face.   First noted swelling last night. Denies dental pain, denies fever.    She also reports some pain which radiates into the left hand pain. Notes that she was given rx for pamelor which has helped some with the pain.  Has been working with Dr. Tamala Julian who thinks that this pain is related to her cervical disc disease. She has previously been followed by Dr. Trenton Gammon.   Review of Systems See HPI  Past Medical History:  Diagnosis Date  . Abdominal pain 08/18/2014  . Allergic state 09/21/2010  . Allergy    rhinitis  . Anemia 02-01-12   iron absorption problem  . Arthritis 02-01-12   osteoarthritis, Back pain, spine surgeries ? retain hardware cervial and  lumbar.  . Asthma   . Back pain 01/08/2011  . Bronchitis, acute 10/31/2010  . Cancer ([place]) 02-01-12   squamous cell -face  . Depression with anxiety 01/08/2011  . Esophageal reflux   . Fatigue 06/11/2011  . Fibromyalgia   . Heart murmur   . Hyperparathyroidism ([place]) 08/18/2014  . Knee pain, right 09/20/2011  . Motion sickness 12/24/2011  . Nausea & vomiting 12/24/2011  . Obesity   . Osteopenia 08/05/2014  . Otitis media 09/20/2011  . Otitis media of both ears 11/16/2010  . Otitis media of left ear 09/19/2010  . Panic attacks   . Sinusitis 06/11/2011  . Sinusitis 12/24/2011  . Sleep apnea, obstructive   . Varicose veins of lower limb 10/02/2011     Social History   Social History  . Marital status: Married    Spouse name: N/A  . Number of children: N/A  . Years of education: N/A   Occupational History  . Not on file.   Social History Main Topics  . Smoking status: Never Smoker  . Smokeless tobacco: Never Used  . Alcohol use No     Comment: special occasions  . Drug use: No  . Sexual activity: No   Other Topics Concern    . Not on file   Social History Narrative  . No narrative on file    Past Surgical History:  Procedure Laterality Date  . ABDOMINAL SURGERY  02-01-12   ABDOMINAL SURGERY  . APPENDECTOMY  1962  . BARIATRIC SURGERY    . BLADDER SUSPENSION     complicated by bowel nick/ clolostomy  . BLEPHAROPLASTY Bilateral   . CARPAL TUNNEL RELEASE  02-01-12   right  . CATARACT EXTRACTION Bilateral   . CERVICAL DISC SURGERY     C-7 in 2004, C-4 and C-5 in 2010  . colostomy bag     Confirm with patient. Listed under medical conditions on form dated 07/18/09.  [name] COLOSTOMY REVERSAL  02-01-12  . EYE SURGERY     b/l cataracts and b/l blepharplasty  . [place]  . GASTRIC BYPASS  2003   Patient also noted "gastric bypass resection - 2004"  . HERNIA REPAIR  2009   Two hernias and abdominal reconstruction  . ivc filter     prophyllactically- no hx DVT  . LUMBAR DISC SURGERY     L1-L3 all had surgery most recently in 2017  . LUMBAR DISC SURGERY     L3-L4, Dr. Trenton Gammon  .  pannilectomy     Per medical history form dated 07/18/09.  [name] TOTAL KNEE ARTHROPLASTY  02/11/2012   Procedure: TOTAL KNEE ARTHROPLASTY;  Surgeon: Mauri Pole, MD;  Location: [facility];  Service: Orthopedics;  Laterality: Right;  . VULVA SURGERY  02-01-12   cyst removal-pt 8 months pregnant    Family History  Problem Relation Age of Onset  . Alcohol abuse Mother   . Other Mother     accidental med overdose  . Emphysema Mother     smoked  . Bipolar disorder Mother   . Other Father     CHF  . Diabetes Father   . Cancer Father     throat ca/ smoked  . Alcohol abuse Father   . Stroke Father   . Hypertension Brother   . Hyperlipidemia Brother   . Obesity Brother   . Heart attack Maternal Grandfather   . Heart attack Paternal Grandmother   . Heart attack Paternal Grandfather   . Arthritis Son     psoriatic  . Arthritis Son     psoriatic  . Obesity Son     Allergies  Allergen Reactions  . Hydrocodone  Nausea And Vomiting     nausea and vomiting and headaches  . Advil [Ibuprofen] Hives and Itching  . Cephalexin      Neuropathy  . Codeine     Crazy in the head, bp drops  . Levofloxacin Itching  . Lyrica [Pregabalin] Other (See Comments)    "Makes me like a zombie. I'm awake but I'm in slow motion."  . Meperidine Hcl     B/P drops  . Morphine And Related      States" extreme migraines"  . Oxycodone-Acetaminophen Other (See Comments)    Crazy in head, drop in bp  . Sulfonamide Derivatives Nausea And Vomiting    Stomach upset  . Adhesive [Tape] Rash    Latex in adhesive tape. Please use paper tape  . Erythromycin Diarrhea and Rash  . Penicillins Nausea And Vomiting and Rash    Current Outpatient Prescriptions on File Prior to Visit  Medication Sig Dispense Refill  . ALPRAZolam (XANAX) 0.25 MG tablet Take 0.25 mg by mouth daily as needed. For anxiety    . amphetamine-dextroamphetamine (ADDERALL XR) 25 MG 24 hr capsule Take 1 capsule by mouth every morning. 30 capsule 0  . aspirin 81 MG tablet Take 81 mg by mouth daily.    [name] atorvastatin (LIPITOR) 10 MG tablet Take 10 mg by mouth daily.    [name] azelastine (ASTELIN) 0.1 % nasal spray 1-2 puffs each nostril once or twice daily as needed 30 mL 12  . DULoxetine (CYMBALTA) 60 MG capsule TAKE ONE CAPSULE BY MOUTH ONCE DAILY 90 capsule 0  . fluticasone (FLONASE) 50 MCG/ACT nasal spray 1-2 puffs each nostril once or twice daily as neededs 16 g 12  . ipratropium (ATROVENT) 0.06 % nasal spray 1-2 puffs each nostril once or twice daily as needed 15 mL 12  . NON FORMULARY CPAP- set on 12 [employer].    . NONFORMULARY OR COMPOUNDED ITEM Allergy Vaccine 1:10 Given at Home    . nortriptyline (PAMELOR) 10 MG capsule Take 1 capsule (10 mg total) by mouth at bedtime. 30 capsule 3  . OPTIMAL-D 09983 units capsule TAKE 2 CAPSULES BY MOUTH ONCE A WEEK 8 capsule 4  . ranitidine (ZANTAC) 150 MG tablet 1 tablet qam    . traMADol (ULTRAM) 50 MG tablet Take 1  tablet (50 mg  total) by mouth 3 (three) times daily as needed. 90 tablet 0  . traZODone (DESYREL) 100 MG tablet TAKE ONE-HALF TO ONE TABLET BY MOUTH ONCE DAILY AS NEEDED FOR SLEEP AS DIRECTED 90 tablet 0  . VESICARE 10 MG tablet TAKE ONE TABLET BY MOUTH ONCE DAILY 30 tablet 3   No current facility-administered medications on file prior to visit.     BP 124/72 (BP Location: Right Arm, Patient Position: Sitting, Cuff Size: Large)   Pulse 60   Temp 98.8 F (37.1 C) (Oral)   Resp 16   Ht 4\' 11"  (1.499 m)   Wt 198 lb 12.8 oz (90.2 kg)   SpO2 96%   BMI 40.15 kg/m       Objective:   Physical Exam  Constitutional: She appears well-developed and well-nourished.  HENT:  Head: Normocephalic and atraumatic.  Right Ear: Tympanic membrane and ear canal normal.  Left Ear: Tympanic membrane and ear canal normal.  Mouth/Throat: No oropharyngeal exudate, posterior oropharyngeal edema or posterior oropharyngeal erythema.  No obvious dental abscess/swelling + tenderness in the preauricular area on left without discrete lymph node enlargement.  No erythema.  Mild soft tissue swelling is noted.   Psychiatric: She has a normal mood and affect. Her behavior is normal. Judgment and thought content normal.       Musculoskeletal: no swelling of left hand noted.      Assessment & Plan:  Facial swelling- ? Mild cellulitis- will rx with doxy (has pen allergy).  She is advised to call if increased pain/swelling/redness fever. Will arrange close follow up.   Cervical radiculopathy-most likely cause for left hand pain. I  advised pt that I think it would be a good idea for her to follow back up with Dr. Trenton Gammon and he can decide if follow up MRI is indicated. Will arrange follow up appointment.

## 2016-04-23 ENCOUNTER — Telehealth: Payer: Self-pay | Admitting: Family Medicine

## 2016-04-23 ENCOUNTER — Ambulatory Visit
Admission: RE | Admit: 2016-04-23 | Discharge: 2016-04-23 | Disposition: A | Discharge status: Home / Self Care | Payer: Medicare Other | Source: Ambulatory Visit | Attending: Obstetrics and Gynecology | Admitting: Obstetrics and Gynecology

## 2016-04-23 ENCOUNTER — Ambulatory Visit: Payer: Medicare Other | Admitting: Family Medicine

## 2016-04-23 DIAGNOSIS — Z1231 Encounter for screening mammogram for malignant neoplasm of breast: Secondary | ICD-10-CM | POA: Diagnosis not present
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
## 2016-04-23 NOTE — Telephone Encounter (Signed)
That seems pretty legitimate. I'm not sure she needs an appointment looking at the note, but if she would like to or thinks she needs to be seen, I am happy to see her.

## 2016-04-24 DIAGNOSIS — R0609 Other forms of dyspnea: Secondary | ICD-10-CM | POA: Diagnosis not present

## 2016-04-24 DIAGNOSIS — I6522 Occlusion and stenosis of left carotid artery: Secondary | ICD-10-CM | POA: Diagnosis not present

## 2016-04-24 NOTE — Progress Notes (Signed)
[name] [address] [place], [place] [address] Phone: [phone] Subjective:     CC: Left hand Weakness follow-up  TMA:[id number]  [name] is a 71 y.o. female coming in with complaint of pain injury. Patient was found to have a fourth and fifth metacarpal fracture. Was still having weakness of the last follow-up. Was found and patient was having more radicular symptoms from the neck. Patient continue taking nortriptyline. Patient was making improvement but unfortunate he still has some weakness. Patient is following up with her neurosurgeon in the near future as well as seen a hand specialist to see if there was anything wrong with the fracture. Patient declined any repeat x-rays in the past.  Patient did have x-rays at last exam. X-rays show severe osteophytic changes of multiple areas of the cervical spine including bone spurring around the ACDF.    Past Medical History:  Diagnosis Date  . Abdominal pain 08/18/2014  . Allergic state 09/21/2010  . Allergy    rhinitis  . Anemia 02-01-12   iron absorption problem  . Arthritis 02-01-12   osteoarthritis, Back pain, spine surgeries ? retain hardware cervial and  lumbar.  . Asthma   . Back pain 01/08/2011  . Bronchitis, acute 10/31/2010  . Cancer ([place]) 02-01-12   squamous cell -face  . Depression with anxiety 01/08/2011  . Esophageal reflux   . Fatigue 06/11/2011  . Fibromyalgia   . Heart murmur   . Hyperparathyroidism ([place]) 08/18/2014  . Knee pain, right 09/20/2011  . Motion sickness 12/24/2011  . Nausea & vomiting 12/24/2011  . Obesity   . Osteopenia 08/05/2014  . Otitis media 09/20/2011  . Otitis media of both ears 11/16/2010  . Otitis media of left ear 09/19/2010  . Panic attacks   . Sinusitis 06/11/2011  . Sinusitis 12/24/2011  . Sleep apnea, obstructive   . Varicose veins of lower limb 10/02/2011   Past Surgical History:  Procedure Laterality Date  . ABDOMINAL SURGERY  02-01-12   ABDOMINAL SURGERY  . APPENDECTOMY  1962  . BARIATRIC SURGERY    . BLADDER SUSPENSION     complicated by bowel nick/ clolostomy  . BLEPHAROPLASTY Bilateral   . CARPAL TUNNEL RELEASE  02-01-12   right  . CATARACT EXTRACTION Bilateral   . CERVICAL DISC SURGERY     C-7 in 2004, C-4 and C-5 in 2010  . colostomy bag     Confirm with patient. Listed under medical conditions on form dated 07/18/09.  [name] COLOSTOMY REVERSAL  02-01-12  . EYE SURGERY     b/l cataracts and b/l blepharplasty  . [place]  . GASTRIC BYPASS  2003   Patient also noted "gastric bypass resection - 2004"  . HERNIA REPAIR  2009   Two hernias and abdominal reconstruction  . ivc filter     prophyllactically- no hx DVT  . LUMBAR DISC SURGERY     L1-L3 all had surgery most recently in 2017  . LUMBAR DISC SURGERY     L3-L4, Dr. Trenton Gammon  . pannilectomy     Per medical history form dated 07/18/09.  [name] TOTAL KNEE ARTHROPLASTY  02/11/2012   Procedure: TOTAL KNEE ARTHROPLASTY;  Surgeon: Mauri Pole, MD;  Location: [facility];  Service: Orthopedics;  Laterality: Right;  . VULVA SURGERY  02-01-12   cyst removal-pt 8 months pregnant   Social History   Social History  . Marital status: Married    Spouse name: N/A  .  Number of children: N/A  . Years of education: N/A   Social History Main Topics  . Smoking status: Never Smoker  . Smokeless tobacco: Never Used  . Alcohol use No     Comment: special occasions  . Drug use: No  . Sexual activity: No   Other Topics Concern  . None   Social History Narrative  . None   Allergies  Allergen Reactions  . Hydrocodone Nausea And Vomiting     nausea and vomiting and headaches  . Advil [Ibuprofen] Hives and Itching  . Cephalexin      Neuropathy  . Codeine     Crazy in the head, bp drops  . Levofloxacin Itching  . Lyrica [Pregabalin] Other (See Comments)    "Makes me like a zombie. I'm awake but I'm in slow motion."  . Meperidine Hcl     B/P drops  . Morphine  And Related      States" extreme migraines"  . Oxycodone-Acetaminophen Other (See Comments)    Crazy in head, drop in bp  . Sulfonamide Derivatives Nausea And Vomiting    Stomach upset  . Adhesive [Tape] Rash    Latex in adhesive tape. Please use paper tape  . Erythromycin Diarrhea and Rash  . Penicillins Nausea And Vomiting and Rash   Family History  Problem Relation Age of Onset  . Alcohol abuse Mother   . Other Mother     accidental med overdose  . Emphysema Mother     smoked  . Bipolar disorder Mother   . Other Father     CHF  . Diabetes Father   . Cancer Father     throat ca/ smoked  . Alcohol abuse Father   . Stroke Father   . Hypertension Brother   . Hyperlipidemia Brother   . Obesity Brother   . Heart attack Maternal Grandfather   . Heart attack Paternal Grandmother   . Heart attack Paternal Grandfather   . Arthritis Son     psoriatic  . Arthritis Son     psoriatic  . Obesity Son     Past medical history, social, surgical and family history all reviewed in electronic medical record.  No pertanent information unless stated regarding to the chief complaint.   Review of Systems: No headache, visual changes, nausea, vomiting, diarrhea, constipation, dizziness, abdominal pain, skin rash, fevers, chills, night sweats, weight loss, swollen lymph nodes, body aches, joint swelling, muscle aches, chest pain, shortness of breath, mood changes.     Objective  Blood pressure 140/72, pulse (!) 58, height 5' (1.524 m), weight 196 lb (88.9 kg).  Systems examined below as of 04/25/16 General: NAD A&O x3 mood, affect normal  HEENT: Pupils equal, extraocular movements intact no nystagmus Respiratory: not short of breath at rest or with speaking Cardiovascular: No lower extremity edema, non tender Skin: Warm dry intact with no signs of infection or rash on extremities or on axial skeleton. Abdomen: Soft nontender, no masses Neuro: Cranial nerves  intact, neurovascularly  intact in all extremities with 2+ DTRs and 2+ pulses. Lymph: No lymphadenopathy appreciated today  Gait normal with good balance and coordination.  MSK: Non tender with full range of motion and good stability and symmetric strength and tone of shoulders, elbows, wrist,  knee hips and ankles bilaterally.    Hand exam shows some very mild angulation of less than 10 of both the fourth and fifth fingers. Patient though does have increasing atrophy of the hyperthenar muscle. Patient's  grip strength is 3 out of 5 compared to the contralateral side. Significant weakness in the C8 T1 distribution of the left upper extremity compared to the right.  Neck: Inspection unremarkable. No palpable stepoffs. Positive Spurling's maneuver. Mild limitation and right-sided and left-sided side bending Strength is minorly worse with 3+ out of 5 strength of the C8 distribution of the left No sensory change to C4 to T1 Negative Hoffman sign bilaterally Reflexes normal      Impression and Recommendations:     This case required medical decision making of moderate complexity.      Note: This dictation was prepared with Dragon dictation along with smaller phrase technology. Any transcriptional errors that result from this process are unintentional.

## 2016-04-25 ENCOUNTER — Ambulatory Visit (INDEPENDENT_AMBULATORY_CARE_PROVIDER_SITE_OTHER): Payer: Medicare Other | Admitting: Family Medicine

## 2016-04-25 ENCOUNTER — Encounter: Payer: Self-pay | Admitting: Family Medicine

## 2016-04-25 DIAGNOSIS — M5412 Radiculopathy, cervical region: Secondary | ICD-10-CM | POA: Diagnosis not present

## 2016-04-25 NOTE — Patient Instructions (Signed)
[name]  I am sorry I don't have a good answer Check with [place] and [facility].  I think before any surgery a EMG may be good.  Also try nortriptyline 2 pills at night for 3 nights and if it helps then I will send in a new medicine.  Please keep me updated and if you want we can try injections.

## 2016-04-25 NOTE — Assessment & Plan Note (Signed)
I believe that this is 2 fold. Patient did have the fracture previously. Patient declined any type of x-ray of the fourth and fifth metacarpal today. I do believe though that patient also has weakness that likely from cervical radiculopathy. Increase patient's nortriptyline 20 mg at night and warned of potential side effects. Discussed with patient about an EMG. I think that this will be beneficial to know how much of the nerve is plain a role. Patient declined this as well. Patient will be following up with Specialist including a hand specialist as well as her neurosurgeon. Hopefully further workup and discussion with them help her figure out what intervention is necessary at this time. Patient knows to call if any significant questions.  Spent  25 minutes with patient face-to-face and had greater than 50% of counseling including as described above in assessment and plan.

## 2016-04-25 NOTE — Progress Notes (Signed)
Pre visit review using our clinic review tool, if applicable. No additional management support is needed unless otherwise documented below in the visit note. 

## 2016-04-30 DIAGNOSIS — R202 Paresthesia of skin: Secondary | ICD-10-CM | POA: Diagnosis not present

## 2016-04-30 DIAGNOSIS — R200 Anesthesia of skin: Secondary | ICD-10-CM | POA: Diagnosis not present

## 2016-04-30 DIAGNOSIS — S62617D Displaced fracture of proximal phalanx of left little finger, subsequent encounter for fracture with routine healing: Secondary | ICD-10-CM | POA: Diagnosis not present

## 2016-05-02 ENCOUNTER — Other Ambulatory Visit: Payer: Self-pay | Admitting: Family Medicine

## 2016-05-02 ENCOUNTER — Ambulatory Visit (INDEPENDENT_AMBULATORY_CARE_PROVIDER_SITE_OTHER): Payer: Medicare Other | Admitting: Family Medicine

## 2016-05-02 ENCOUNTER — Encounter: Payer: Self-pay | Admitting: Family Medicine

## 2016-05-02 VITALS — BP 110/50 | HR 63 | Temp 98.3°F | Ht 60.0 in | Wt 195.4 lb

## 2016-05-02 DIAGNOSIS — Z09 Encounter for follow-up examination after completed treatment for conditions other than malignant neoplasm: Secondary | ICD-10-CM | POA: Diagnosis not present

## 2016-05-02 MED ORDER — TRAZODONE HCL 100 MG PO TABS: ORAL_TABLET | ORAL | 1 refills | Status: DC

## 2016-05-02 NOTE — Progress Notes (Signed)
Pre visit review using our clinic review tool, if applicable. No additional management support is needed unless otherwise documented below in the visit note. 

## 2016-05-02 NOTE — Patient Instructions (Signed)
If your symptoms return, come back to clinic.

## 2016-05-02 NOTE — Progress Notes (Signed)
Chief Complaint  Patient presents with  . Follow-up    on facial swelling-pt states the swelling is better    Subjective: Patient is a 71 y.o. female here for f/u facial swelling.  Pt had some swelling over L side of jaw on 3/27, was see the following day and placed on Doxy. This did not help, but she was forced to miss her f/u appt due to a family emergency. Since then, things have steadily improved. She reports being around 90% improved and continues to get better. Denies any new medications, topicals, recent travel, pain, fevers.   ROS: Skin: As noted in HPI  Family History  Problem Relation Age of Onset  . Alcohol abuse Mother   . Other Mother     accidental med overdose  . Emphysema Mother     smoked  . Bipolar disorder Mother   . Other Father     CHF  . Diabetes Father   . Cancer Father     throat ca/ smoked  . Alcohol abuse Father   . Stroke Father   . Hypertension Brother   . Hyperlipidemia Brother   . Obesity Brother   . Heart attack Maternal Grandfather   . Heart attack Paternal Grandmother   . Heart attack Paternal Grandfather   . Arthritis Son     psoriatic  . Arthritis Son     psoriatic  . Obesity Son    Past Medical History:  Diagnosis Date  . Abdominal pain 08/18/2014  . Allergic state 09/21/2010  . Allergy    rhinitis  . Anemia 02-01-12   iron absorption problem  . Arthritis 02-01-12   osteoarthritis, Back pain, spine surgeries ? retain hardware cervial and  lumbar.  . Asthma   . Back pain 01/08/2011  . Bronchitis, acute 10/31/2010  . Cancer ([place]) 02-01-12   squamous cell -face  . Depression with anxiety 01/08/2011  . Esophageal reflux   . Fatigue 06/11/2011  . Fibromyalgia   . Heart murmur   . Hyperparathyroidism ([place]) 08/18/2014  . Knee pain, right 09/20/2011  . Motion sickness 12/24/2011  . Nausea & vomiting 12/24/2011  . Obesity   . Osteopenia 08/05/2014  . Otitis media 09/20/2011  . Otitis media of both ears 11/16/2010  . Otitis media of  left ear 09/19/2010  . Panic attacks   . Sinusitis 06/11/2011  . Sinusitis 12/24/2011  . Sleep apnea, obstructive   . Varicose veins of lower limb 10/02/2011   Allergies  Allergen Reactions  . Hydrocodone Nausea And Vomiting     nausea and vomiting and headaches  . Advil [Ibuprofen] Hives and Itching  . Cephalexin      Neuropathy  . Codeine     Crazy in the head, bp drops  . Levofloxacin Itching  . Lyrica [Pregabalin] Other (See Comments)    "Makes me like a zombie. I'm awake but I'm in slow motion."  . Meperidine Hcl     B/P drops  . Morphine And Related      States" extreme migraines"  . Oxycodone-Acetaminophen Other (See Comments)    Crazy in head, drop in bp  . Sulfonamide Derivatives Nausea And Vomiting    Stomach upset  . Adhesive [Tape] Rash    Latex in adhesive tape. Please use paper tape  . Erythromycin Diarrhea and Rash  . Penicillins Nausea And Vomiting and Rash    Current Outpatient Prescriptions:  .  ALPRAZolam (XANAX) 0.25 MG tablet, Take 0.25 mg by mouth daily as  needed. For anxiety, Disp: , Rfl:  .  amphetamine-dextroamphetamine (ADDERALL XR) 25 MG 24 hr capsule, Take 1 capsule by mouth every morning., Disp: 30 capsule, Rfl: 0 .  aspirin 81 MG tablet, Take 81 mg by mouth daily., Disp: , Rfl:  .  atorvastatin (LIPITOR) 10 MG tablet, Take 10 mg by mouth daily., Disp: , Rfl:  .  azelastine (ASTELIN) 0.1 % nasal spray, 1-2 puffs each nostril once or twice daily as needed, Disp: 30 mL, Rfl: 12 .  DULoxetine (CYMBALTA) 60 MG capsule, TAKE ONE CAPSULE BY MOUTH ONCE DAILY, Disp: 90 capsule, Rfl: 0 .  fluticasone (FLONASE) 50 MCG/ACT nasal spray, 1-2 puffs each nostril once or twice daily as neededs, Disp: 16 g, Rfl: 12 .  ipratropium (ATROVENT) 0.06 % nasal spray, 1-2 puffs each nostril once or twice daily as needed, Disp: 15 mL, Rfl: 12 .  NON FORMULARY, CPAP- set on [employer]., Disp: , Rfl:  .  NONFORMULARY OR COMPOUNDED ITEM, Allergy Vaccine 1:10 Given at Home, Disp:  , Rfl:  .  nortriptyline (PAMELOR) 10 MG capsule, Take 1 capsule (10 mg total) by mouth at bedtime., Disp: 30 capsule, Rfl: 3 .  OPTIMAL-D 74081 units capsule, TAKE 2 CAPSULES BY MOUTH ONCE A WEEK, Disp: 8 capsule, Rfl: 4 .  ranitidine (ZANTAC) 150 MG tablet, 1 tablet qam, Disp: , Rfl:  .  traZODone (DESYREL) 100 MG tablet, TAKE ONE-HALF TO ONE TABLET BY MOUTH ONCE DAILY AS NEEDED FOR SLEEP AS DIRECTED, Disp: 90 tablet, Rfl: 1 .  VESICARE 10 MG tablet, TAKE ONE TABLET BY MOUTH ONCE DAILY, Disp: 30 tablet, Rfl: 3  Objective: BP (!) 110/50 (BP Location: Left Arm, Patient Position: Sitting, Cuff Size: Large)   Pulse 63   Temp 98.3 F (36.8 C) (Oral)   Ht 5' (1.524 m)   Wt 195 lb 6.4 oz (88.6 kg)   SpO2 96%   BMI 38.16 kg/m  General: Awake, appears stated age HEENT: MMM, no erythema, drainage, evidence of gingivitis, or edema, ears neg b/l Lungs:No accessory muscle use Skin: No erythema, fluctuance, or TTP; minimal edema over L TMJ compared to R MSK: No TTP over TMJ b/l, no jaw deviation Psych: Age appropriate judgment and insight, normal affect and mood  Assessment and Plan: Follow-up for resolved condition  Doesn't sound infectious given lack of response to abx and presentation. Could have been a stone? If symptoms return, will consider [place] if she fu with me.  F/u prn otherwise. The patient voiced understanding and agreement to the plan.  [place], DO 05/02/16  7:46 AM

## 2016-05-03 DIAGNOSIS — I350 Nonrheumatic aortic (valve) stenosis: Secondary | ICD-10-CM | POA: Diagnosis not present

## 2016-05-03 DIAGNOSIS — E785 Hyperlipidemia, unspecified: Secondary | ICD-10-CM | POA: Diagnosis not present

## 2016-05-03 DIAGNOSIS — R0989 Other specified symptoms and signs involving the circulatory and respiratory systems: Secondary | ICD-10-CM | POA: Diagnosis not present

## 2016-05-03 DIAGNOSIS — I4510 Unspecified right bundle-branch block: Secondary | ICD-10-CM | POA: Diagnosis not present

## 2016-05-10 DIAGNOSIS — G5602 Carpal tunnel syndrome, left upper limb: Secondary | ICD-10-CM | POA: Diagnosis not present

## 2016-05-21 DIAGNOSIS — R202 Paresthesia of skin: Secondary | ICD-10-CM | POA: Diagnosis not present

## 2016-05-21 DIAGNOSIS — R200 Anesthesia of skin: Secondary | ICD-10-CM | POA: Diagnosis not present

## 2016-05-31 ENCOUNTER — Other Ambulatory Visit: Payer: Self-pay | Admitting: Family Medicine

## 2016-05-31 DIAGNOSIS — Z6841 Body Mass Index (BMI) 40.0 and over, adult: Secondary | ICD-10-CM | POA: Diagnosis not present

## 2016-05-31 DIAGNOSIS — M5412 Radiculopathy, cervical region: Secondary | ICD-10-CM | POA: Diagnosis not present

## 2016-05-31 DIAGNOSIS — I10 Essential (primary) hypertension: Secondary | ICD-10-CM | POA: Diagnosis not present

## 2016-06-06 ENCOUNTER — Other Ambulatory Visit: Payer: Self-pay | Admitting: Neurosurgery

## 2016-06-06 DIAGNOSIS — M5412 Radiculopathy, cervical region: Secondary | ICD-10-CM

## 2016-06-11 ENCOUNTER — Ambulatory Visit
Admission: RE | Admit: 2016-06-11 | Discharge: 2016-06-11 | Disposition: A | Discharge status: Home / Self Care | Payer: Medicare Other | Source: Ambulatory Visit | Attending: Neurosurgery | Admitting: Neurosurgery

## 2016-06-11 DIAGNOSIS — M47812 Spondylosis without myelopathy or radiculopathy, cervical region: Secondary | ICD-10-CM | POA: Diagnosis not present

## 2016-06-11 DIAGNOSIS — M5412 Radiculopathy, cervical region: Secondary | ICD-10-CM

## 2016-06-13 DIAGNOSIS — Z6841 Body Mass Index (BMI) 40.0 and over, adult: Secondary | ICD-10-CM | POA: Diagnosis not present

## 2016-06-13 DIAGNOSIS — G5602 Carpal tunnel syndrome, left upper limb: Secondary | ICD-10-CM | POA: Diagnosis not present

## 2016-06-13 DIAGNOSIS — G5622 Lesion of ulnar nerve, left upper limb: Secondary | ICD-10-CM | POA: Diagnosis not present

## 2016-07-18 DIAGNOSIS — G5622 Lesion of ulnar nerve, left upper limb: Secondary | ICD-10-CM | POA: Diagnosis not present

## 2016-07-18 DIAGNOSIS — G5602 Carpal tunnel syndrome, left upper limb: Secondary | ICD-10-CM | POA: Diagnosis not present

## 2016-07-18 HISTORY — PX: ULNAR NERVE REPAIR: SHX2594

## 2016-08-04 ENCOUNTER — Other Ambulatory Visit: Payer: Self-pay | Admitting: Family Medicine

## 2016-08-07 DIAGNOSIS — H43813 Vitreous degeneration, bilateral: Secondary | ICD-10-CM | POA: Diagnosis not present

## 2016-08-07 DIAGNOSIS — H02833 Dermatochalasis of right eye, unspecified eyelid: Secondary | ICD-10-CM | POA: Diagnosis not present

## 2016-08-09 ENCOUNTER — Telehealth: Payer: Self-pay | Admitting: Internal Medicine

## 2016-08-09 DIAGNOSIS — G4733 Obstructive sleep apnea (adult) (pediatric): Secondary | ICD-10-CM

## 2016-08-09 NOTE — Telephone Encounter (Signed)
ATC patient. Call would not go through. Will try again later.

## 2016-08-10 NOTE — Telephone Encounter (Signed)
atc the pt but the phone lines rings a fast busy signal.  Will need to speak with pt when she calls back or get a different number that she can be reached on.  Thanks

## 2016-08-10 NOTE — Telephone Encounter (Signed)
Called pt. And told her about the allergy letter, she asked me to e-mail it to her. I thought we could e-mail it but when I checked with Joellen [place] she said we couldn't. It's not secure enough. I called her back to let her know I couldn't e-mail the letter so I would mail it to her. I sent it out this morning. I also told her if she doesn't get it in the mail today she should get it Sat.. Nothing further needed.

## 2016-08-13 NOTE — Telephone Encounter (Signed)
Called and spoke to pt. Pt is requesting a new humidifier for CPAP, hers is broken. Order placed. Pt verbalized understanding and denied any further questions or concerns at this time.

## 2016-08-15 NOTE — Progress Notes (Signed)
Subjective:   [name] is a 71 y.o. female who presents for Medicare Annual (Subsequent) preventive examination.  Review of Systems:  No ROS.  Medicare Wellness Visit. Additional risk factors are reflected in the social history. Cardiac Risk Factors include: advanced age (>64men, >22 women);diabetes mellitus;dyslipidemia;obesity (BMI >30kg/m2);sedentary lifestyle Sleep patterns: Takes trazodone to sleep. Sleeps 6-7 hrs. Feel tired. Still working. Home Safety/Smoke Alarms: Feels safe in home. Smoke alarms in place.  Living environment; residence and Firearm Safety: Lives with husband in 1 story home. No guns.  Seat Belt Safety/Bike Helmet: Wears seat belt.   Counseling:   Eye Exam- Hx cataract sx. Reading glasses. Dr.Mincy yearly. Dental- Dr.Booth every 6 months.   Female:   Pap-   Dr.Haygood every 2 yrs. Pt reports normal.  Mammo-  Last 04/24/16: BI-RADS CATEGORY  1: Negative     Dexa scan-  Last 01/08/16: osteoporosis      CCS- Last 09/22/09: reported.-no result on file. Objective:     Vitals: BP 123/89 (BP Location: Right Arm, Patient Position: Sitting, Cuff Size: Normal)   Pulse 60   Ht 5' (1.524 m)   Wt 202 lb 9.6 oz (91.9 kg)   SpO2 97%   BMI 39.57 kg/m   Body mass index is 39.57 kg/m.   Tobacco History  Smoking Status  . Never Smoker  Smokeless Tobacco  . Never Used     Counseling given: Not Answered   Past Medical History:  Diagnosis Date  . Abdominal pain 08/18/2014  . Allergic state 09/21/2010  . Allergy    rhinitis  . Anemia 02-01-12   iron absorption problem  . Arthritis 02-01-12   osteoarthritis, Back pain, spine surgeries ? retain hardware cervial and  lumbar.  . Asthma   . Back pain 01/08/2011  . Bronchitis, acute 10/31/2010  . Cancer ([place]) 02-01-12   squamous cell -face  . Depression with anxiety 01/08/2011  . Esophageal reflux   . Fatigue 06/11/2011  . Fibromyalgia   . Heart murmur   . Hyperparathyroidism ([place]) 08/18/2014  . Knee pain,  right 09/20/2011  . Motion sickness 12/24/2011  . Nausea & vomiting 12/24/2011  . Obesity   . Osteopenia 08/05/2014  . Otitis media 09/20/2011  . Otitis media of both ears 11/16/2010  . Otitis media of left ear 09/19/2010  . Panic attacks   . Sinusitis 06/11/2011  . Sinusitis 12/24/2011  . Sleep apnea, obstructive   . Varicose veins of lower limb 10/02/2011   Past Surgical History:  Procedure Laterality Date  . ABDOMINAL SURGERY  02-01-12   ABDOMINAL SURGERY  . APPENDECTOMY  1962  . BARIATRIC SURGERY    . BLADDER SUSPENSION     complicated by bowel nick/ clolostomy  . BLEPHAROPLASTY Bilateral   . CARPAL TUNNEL RELEASE  02-01-12   right  . CATARACT EXTRACTION Bilateral   . CERVICAL DISC SURGERY     C-7 in 2004, C-4 and C-5 in 2010  . colostomy bag     Confirm with patient. Listed under medical conditions on form dated 07/18/09.  [name] COLOSTOMY REVERSAL  02-01-12  . EYE SURGERY     b/l cataracts and b/l blepharplasty  . [place]  . GASTRIC BYPASS  2003   Patient also noted "gastric bypass resection - 2004"  . HERNIA REPAIR  2009   Two hernias and abdominal reconstruction  . ivc filter     prophyllactically- no hx DVT  . LUMBAR DISC SURGERY  L1-L3 all had surgery most recently in 2017  . LUMBAR DISC SURGERY     L3-L4, Dr. Trenton Gammon  . pannilectomy     Per medical history form dated 07/18/09.  [name] TOTAL KNEE ARTHROPLASTY  02/11/2012   Procedure: TOTAL KNEE ARTHROPLASTY;  Surgeon: Mauri Pole, MD;  Location: [facility];  Service: Orthopedics;  Laterality: Right;  . ULNAR NERVE REPAIR Left 07/18/2016   by Dr.Henry Pool  . VULVA SURGERY  02-01-12   cyst removal-pt 8 months pregnant   Family History  Problem Relation Age of Onset  . Alcohol abuse Mother   . Other Mother        accidental med overdose  . Emphysema Mother        smoked  . Bipolar disorder Mother   . Other Father        CHF  . Diabetes Father   . Cancer Father        throat ca/ smoked  . Alcohol abuse  Father   . Stroke Father   . Hypertension Brother   . Hyperlipidemia Brother   . Obesity Brother   . Heart attack Maternal Grandfather   . Heart attack Paternal Grandmother   . Heart attack Paternal Grandfather   . Arthritis Son        psoriatic  . Arthritis Son        psoriatic  . Obesity Son    History  Sexual Activity  . Sexual activity: No    Outpatient Encounter Prescriptions as of 08/23/2016  Medication Sig  . aspirin 81 MG tablet Take 81 mg by mouth daily.  [name] atorvastatin (LIPITOR) 10 MG tablet Take 10 mg by mouth daily.  [name] azelastine (ASTELIN) 0.1 % nasal spray 1-2 puffs each nostril once or twice daily as needed  . DULoxetine (CYMBALTA) 60 MG capsule TAKE 1 CAPSULE BY MOUTH ONCE DAILY  . fluticasone (FLONASE) 50 MCG/ACT nasal spray 1-2 puffs each nostril once or twice daily as neededs  . ipratropium (ATROVENT) 0.06 % nasal spray 1-2 puffs each nostril once or twice daily as needed  . NON FORMULARY CPAP- set on [employer].  . NONFORMULARY OR COMPOUNDED ITEM Allergy Vaccine 1:10 Given at Home  . nortriptyline (PAMELOR) 10 MG capsule Take 1 capsule (10 mg total) by mouth at bedtime.  . ranitidine (ZANTAC) 150 MG tablet 1 tablet qam  . traZODone (DESYREL) 100 MG tablet TAKE ONE-HALF TO ONE TABLET BY MOUTH ONCE DAILY AS NEEDED FOR SLEEP AS DIRECTED  . VESICARE 10 MG tablet TAKE 1 TABLET BY MOUTH ONCE DAILY  . ALPRAZolam (XANAX) 0.25 MG tablet Take 0.25 mg by mouth daily as needed. For anxiety  . amphetamine-dextroamphetamine (ADDERALL XR) 25 MG 24 hr capsule Take 1 capsule by mouth every morning. (Patient not taking: Reported on 08/23/2016)  . OPTIMAL-D 46803 units capsule TAKE 2 CAPSULES BY MOUTH ONCE A WEEK (Patient not taking: Reported on 08/23/2016)   No facility-administered encounter medications on file as of 08/23/2016.     Activities of Daily Living In your present state of health, do you have any difficulty performing the following activities: 08/23/2016  Hearing? N    Vision? N  Difficulty concentrating or making decisions? N  Walking or climbing stairs? Y  Comment "bad knees"  Dressing or bathing? N  Doing errands, shopping? N  Preparing Food and eating ? N  Using the Toilet? N  In the past six months, have you accidently leaked urine? Y  Do you have  problems with loss of bowel control? Y  Managing your Medications? N  Managing your Finances? N  Housekeeping or managing your Housekeeping? N  Some recent data might be hidden    Patient Care Team: Mosie Lukes, MD as PCP - General (Family Medicine) Lyndal Pulley, DO as Attending Physician (Family Medicine) Deneise Lever, MD as Consulting Physician (Pulmonary Disease) [place], Jennefer Bravo, MD as Referring Physician (Dermatology) Leo Grosser Seymour Bars, MD as Consulting Physician (Obstetrics and Gynecology) Adrian Prows, MD as Consulting Physician (Cardiology) Alanda Slim Neena Rhymes, MD as Consulting Physician (Ophthalmology)    Assessment:    Physical assessment deferred to PCP.  Exercise Activities and Dietary recommendations Current Exercise Habits: The patient does not participate in regular exercise at present, Exercise limited by: orthopedic condition(s)   Diet (meal preparation, eat out, water intake, caffeinated beverages, dairy products, fruits and vegetables): in general, a "healthy" diet    Goals    . Increase physical activity      Fall Risk Fall Risk  08/23/2016 08/22/2015 08/05/2014  Falls in the past year? Yes No No  Number falls in past yr: 1 - -  Follow up Education provided - -   Depression Screen PHQ 2/9 Scores 08/23/2016 08/22/2015 08/05/2014  PHQ - 2 Score 0 2 0  PHQ- 9 Score - 6 -     Cognitive Function MMSE - Mini Mental State Exam 08/23/2016 08/22/2015  Orientation to time 5 5  Orientation to Place 5 5  Registration 3 3  Attention/ Calculation 5 5  Recall 3 3  Language- name 2 objects 2 2  Language- repeat 1 1  Language- follow 3 step command 3 3   Language- read & follow direction 1 1  Write a sentence 1 1  Copy design 1 1  Total score 30 30        Immunization History  Administered Date(s) Administered  . DTaP 01/23/1968  . Influenza Split 10/24/2010, 10/16/2011, 10/25/2015  . Influenza Whole 11/08/2008, 10/22/2009  . Influenza, High Dose Seasonal PF 10/23/2014  . Influenza,inj,Quad PF,36+ Mos 11/07/2012  . Pneumococcal Conjugate-13 06/08/2013  . Pneumococcal Polysaccharide-23 10/22/2009, 08/22/2015  . Tdap 10/23/2006, 11/17/2013  . Zoster 01/23/2004   Screening Tests Health Maintenance  Topic Date Due  . FOOT EXAM  02/12/1955  . URINE MICROALBUMIN  02/04/2016  . OPHTHALMOLOGY EXAM  07/27/2016  . INFLUENZA VACCINE  08/22/2016  . HEMOGLOBIN A1C  09/11/2016  . MAMMOGRAM  04/24/2018  . COLONOSCOPY  09/23/2019  . TETANUS/TDAP  11/18/2023  . DEXA SCAN  Completed  . Hepatitis C Screening  Completed  . PNA vac Low Risk Adult  Completed      Plan:   Follow up with Dr.Blyth as scheduled to discuss your concerns:Rright face and ear pain. Congestion? Muscle cramps and dizziness. Vesicare not helping.   Continue to eat heart healthy diet (full of fruits, vegetables, whole grains, lean protein, water--limit salt, fat, and sugar intake) and increase physical activity as tolerated.  Continue doing brain stimulating activities (puzzles, reading, adult coloring books, staying active) to keep memory sharp.   Drink plenty of water and continue eating nuts.  I have personally reviewed and noted the following in the patient's chart:   . Medical and social history . Use of alcohol, tobacco or illicit drugs  . Current medications and supplements . Functional ability and status . Nutritional status . Physical activity . Advanced directives . List of other physicians . Hospitalizations, surgeries, and ER visits in previous  12 months . Vitals . Screenings to include cognitive, depression, and falls . Referrals and  appointments  In addition, I have reviewed and discussed with patient certain preventive protocols, quality metrics, and best practice recommendations. A written personalized care plan for preventive services as well as general preventive health recommendations were provided to patient.     Shela Nevin, [place]  08/23/2016

## 2016-08-21 ENCOUNTER — Ambulatory Visit: Payer: Medicare Other | Admitting: *Deleted

## 2016-08-23 ENCOUNTER — Ambulatory Visit (INDEPENDENT_AMBULATORY_CARE_PROVIDER_SITE_OTHER): Payer: Medicare Other | Admitting: *Deleted

## 2016-08-23 ENCOUNTER — Ambulatory Visit: Payer: Medicare Other | Admitting: *Deleted

## 2016-08-23 ENCOUNTER — Encounter: Payer: Self-pay | Admitting: *Deleted

## 2016-08-23 VITALS — BP 123/89 | HR 60 | Ht 60.0 in | Wt 202.6 lb

## 2016-08-23 DIAGNOSIS — Z0000 Encounter for general adult medical examination without abnormal findings: Secondary | ICD-10-CM | POA: Diagnosis not present

## 2016-08-23 NOTE — Patient Instructions (Signed)
[name] , Thank you for taking time to come for your Medicare Wellness Visit. I appreciate your ongoing commitment to your health goals. Please review the following plan we discussed and let me know if I can assist you in the future.   These are the goals we discussed: Goals    . Increase physical activity       This is a list of the screening recommended for you and due dates:  Health Maintenance  Topic Date Due  . Complete foot exam   02/12/1955  . Urine Protein Check  02/04/2016  . Eye exam for diabetics  07/27/2016  . Flu Shot  08/22/2016  . Hemoglobin A1C  09/11/2016  . Mammogram  04/24/2018  . Colon Cancer Screening  09/23/2019  . Tetanus Vaccine  11/18/2023  . DEXA scan (bone density measurement)  Completed  .  Hepatitis C: One time screening is recommended by Center for Disease Control  (CDC) for  adults born from [birth year] through 1963.   Completed  . Pneumonia vaccines  Completed   Follow up with Dr.Blyth as scheduled.  Continue to eat heart healthy diet (full of fruits, vegetables, whole grains, lean protein, water--limit salt, fat, and sugar intake) and increase physical activity as tolerated.  Continue doing brain stimulating activities (puzzles, reading, adult coloring books, staying active) to keep memory sharp.   Drink plenty of water and continue eating nuts.   Health Maintenance for Postmenopausal Women Menopause is a normal process in which your reproductive ability comes to an end. This process happens gradually over a span of months to years, usually between the ages of 69 and 53. Menopause is complete when you have missed 12 consecutive menstrual periods. It is important to talk with your health care provider about some of the most common conditions that affect postmenopausal women, such as heart disease, cancer, and bone loss (osteoporosis). Adopting a healthy lifestyle and getting preventive care can help to promote your health and wellness. Those actions  can also lower your chances of developing some of these common conditions. What should I know about menopause? During menopause, you may experience a number of symptoms, such as:  Moderate-to-severe hot flashes.  Night sweats.  Decrease in sex drive.  Mood swings.  Headaches.  Tiredness.  Irritability.  Memory problems.  Insomnia.  Choosing to treat or not to treat menopausal changes is an individual decision that you make with your health care provider. What should I know about hormone replacement therapy and supplements? Hormone therapy products are effective for treating symptoms that are associated with menopause, such as hot flashes and night sweats. Hormone replacement carries certain risks, especially as you become older. If you are thinking about using estrogen or estrogen with progestin treatments, discuss the benefits and risks with your health care provider. What should I know about heart disease and stroke? Heart disease, heart attack, and stroke become more likely as you age. This may be due, in part, to the hormonal changes that your body experiences during menopause. These can affect how your body processes dietary fats, triglycerides, and cholesterol. Heart attack and stroke are both medical emergencies. There are many things that you can do to help prevent heart disease and stroke:  Have your blood pressure checked at least every 1-2 years. High blood pressure causes heart disease and increases the risk of stroke.  If you are 69-40 years old, ask your health care provider if you should take aspirin to prevent a heart attack or  a stroke.  Do not use any tobacco products, including cigarettes, chewing tobacco, or electronic cigarettes. If you need help quitting, ask your health care provider.  It is important to eat a healthy diet and maintain a healthy weight. ? Be sure to include plenty of vegetables, fruits, low-fat dairy products, and lean protein. ? Avoid  eating foods that are high in solid fats, added sugars, or salt (sodium).  Get regular exercise. This is one of the most important things that you can do for your health. ? Try to exercise for at least 150 minutes each week. The type of exercise that you do should increase your heart rate and make you sweat. This is known as moderate-intensity exercise. ? Try to do strengthening exercises at least twice each week. Do these in addition to the moderate-intensity exercise.  Know your numbers.Ask your health care provider to check your cholesterol and your blood glucose. Continue to have your blood tested as directed by your health care provider.  What should I know about cancer screening? There are several types of cancer. Take the following steps to reduce your risk and to catch any cancer development as early as possible. Breast Cancer  Practice breast self-awareness. ? This means understanding how your breasts normally appear and feel. ? It also means doing regular breast self-exams. Let your health care provider know about any changes, no matter how small.  If you are 69 or older, have a clinician do a breast exam (clinical breast exam or CBE) every year. Depending on your age, family history, and medical history, it may be recommended that you also have a yearly breast X-ray (mammogram).  If you have a family history of breast cancer, talk with your health care provider about genetic screening.  If you are at high risk for breast cancer, talk with your health care provider about having an MRI and a mammogram every year.  Breast cancer (BRCA) gene test is recommended for women who have family members with BRCA-related cancers. Results of the assessment will determine the need for genetic counseling and BRCA1 and for BRCA2 testing. BRCA-related cancers include these types: ? Breast. This occurs in males or females. ? Ovarian. ? Tubal. This may also be called fallopian tube cancer. ? Cancer  of the abdominal or pelvic lining (peritoneal cancer). ? Prostate. ? Pancreatic.  Cervical, Uterine, and Ovarian Cancer Your health care provider may recommend that you be screened regularly for cancer of the pelvic organs. These include your ovaries, uterus, and vagina. This screening involves a pelvic exam, which includes checking for microscopic changes to the surface of your cervix (Pap test).  For women ages 21-65, health care providers may recommend a pelvic exam and a Pap test every three years. For women ages 34-65, they may recommend the Pap test and pelvic exam, combined with testing for human papilloma virus (HPV), every five years. Some types of HPV increase your risk of cervical cancer. Testing for HPV may also be done on women of any age who have unclear Pap test results.  Other health care providers may not recommend any screening for nonpregnant women who are considered low risk for pelvic cancer and have no symptoms. Ask your health care provider if a screening pelvic exam is right for you.  If you have had past treatment for cervical cancer or a condition that could lead to cancer, you need Pap tests and screening for cancer for at least 20 years after your treatment. If Pap tests  have been discontinued for you, your risk factors (such as having a new sexual partner) need to be reassessed to determine if you should start having screenings again. Some women have medical problems that increase the chance of getting cervical cancer. In these cases, your health care provider may recommend that you have screening and Pap tests more often.  If you have a family history of uterine cancer or ovarian cancer, talk with your health care provider about genetic screening.  If you have vaginal bleeding after reaching menopause, tell your health care provider.  There are currently no reliable tests available to screen for ovarian cancer.  Lung Cancer Lung cancer screening is recommended for  adults 82-53 years old who are at high risk for lung cancer because of a history of smoking. A yearly low-dose CT scan of the lungs is recommended if you:  Currently smoke.  Have a history of at least 30 pack-years of smoking and you currently smoke or have quit within the past 15 years. A pack-year is smoking an average of one pack of cigarettes per day for one year.  Yearly screening should:  Continue until it has been 15 years since you quit.  Stop if you develop a health problem that would prevent you from having lung cancer treatment.  Colorectal Cancer  This type of cancer can be detected and can often be prevented.  Routine colorectal cancer screening usually begins at age 64 and continues through age 86.  If you have risk factors for colon cancer, your health care provider may recommend that you be screened at an earlier age.  If you have a family history of colorectal cancer, talk with your health care provider about genetic screening.  Your health care provider may also recommend using home test kits to check for hidden blood in your stool.  A small camera at the end of a tube can be used to examine your colon directly (sigmoidoscopy or colonoscopy). This is done to check for the earliest forms of colorectal cancer.  Direct examination of the colon should be repeated every 5-10 years until age 63. However, if early forms of precancerous polyps or small growths are found or if you have a family history or genetic risk for colorectal cancer, you may need to be screened more often.  Skin Cancer  Check your skin from head to toe regularly.  Monitor any moles. Be sure to tell your health care provider: ? About any new moles or changes in moles, especially if there is a change in a mole's shape or color. ? If you have a mole that is larger than the size of a pencil eraser.  If any of your family members has a history of skin cancer, especially at a young age, talk with your  health care provider about genetic screening.  Always use sunscreen. Apply sunscreen liberally and repeatedly throughout the day.  Whenever you are outside, protect yourself by wearing long sleeves, pants, a wide-brimmed hat, and sunglasses.  What should I know about osteoporosis? Osteoporosis is a condition in which bone destruction happens more quickly than new bone creation. After menopause, you may be at an increased risk for osteoporosis. To help prevent osteoporosis or the bone fractures that can happen because of osteoporosis, the following is recommended:  If you are 88-5 years old, get at least 1,000 mg of calcium and at least 600 mg of vitamin D per day.  If you are older than age 84 but younger than  age 34, get at least 1,200 mg of calcium and at least 600 mg of vitamin D per day.  If you are older than age 46, get at least 1,200 mg of calcium and at least 800 mg of vitamin D per day.  Smoking and excessive alcohol intake increase the risk of osteoporosis. Eat foods that are rich in calcium and vitamin D, and do weight-bearing exercises several times each week as directed by your health care provider. What should I know about how menopause affects my mental health? Depression may occur at any age, but it is more common as you become older. Common symptoms of depression include:  Low or sad mood.  Changes in sleep patterns.  Changes in appetite or eating patterns.  Feeling an overall lack of motivation or enjoyment of activities that you previously enjoyed.  Frequent crying spells.  Talk with your health care provider if you think that you are experiencing depression. What should I know about immunizations? It is important that you get and maintain your immunizations. These include:  Tetanus, diphtheria, and pertussis (Tdap) booster vaccine.  Influenza every year before the flu season begins.  Pneumonia vaccine.  Shingles vaccine.  Your health care provider may  also recommend other immunizations. This information is not intended to replace advice given to you by your health care provider. Make sure you discuss any questions you have with your health care provider. Document Released: 03/02/2005 Document Revised: 07/29/2015 Document Reviewed: 10/12/2014 Elsevier Interactive Patient Education  2018 [employer].

## 2016-08-27 ENCOUNTER — Ambulatory Visit: Payer: Medicare Other | Admitting: Family Medicine

## 2016-08-28 ENCOUNTER — Ambulatory Visit (INDEPENDENT_AMBULATORY_CARE_PROVIDER_SITE_OTHER): Payer: Medicare Other | Admitting: Family Medicine

## 2016-08-28 ENCOUNTER — Encounter: Payer: Self-pay | Admitting: Family Medicine

## 2016-08-28 ENCOUNTER — Ambulatory Visit: Payer: Self-pay

## 2016-08-28 VITALS — BP 128/70 | HR 64 | Ht 60.0 in | Wt 194.0 lb

## 2016-08-28 DIAGNOSIS — M25561 Pain in right knee: Secondary | ICD-10-CM

## 2016-08-28 DIAGNOSIS — S8012XA Contusion of left lower leg, initial encounter: Secondary | ICD-10-CM

## 2016-08-28 DIAGNOSIS — S8002XA Contusion of left knee, initial encounter: Secondary | ICD-10-CM | POA: Diagnosis not present

## 2016-08-28 DIAGNOSIS — M25562 Pain in left knee: Secondary | ICD-10-CM | POA: Diagnosis not present

## 2016-08-28 MED ORDER — GABAPENTIN 100 MG PO CAPS: 200.0000 mg | ORAL_CAPSULE | Freq: Every day | ORAL | 3 refills | Status: DC

## 2016-08-28 NOTE — Assessment & Plan Note (Signed)
Severe knee contusion noted. Discussed arnica. Discussed icing regimen and home exercises. Discussed the possibility of injection. Declined today. Will follow-up again in 2 weeks if not completely resolved.

## 2016-08-28 NOTE — Progress Notes (Signed)
[name] [address], [place] [address] Phone: [phone] Subjective:    I'm seeing this patient by the request  of:  Mosie Lukes, MD   CC: Knee pain  WUX:[id number]  [name] is a 71 y.o. female coming in with complaint of knee pain.  Bilateral knee pain. Found directly on the patellas. Does have a right knee replacement. Left knee has been severe. Rates the severity of 8 out of 10. Does have severe arthritis. Patient was unable to walk for 24 hours. Starting to slowly improve. Did have significant swelling. No bruising.    Past Medical History:  Diagnosis Date  . Abdominal pain 08/18/2014  . Allergic state 09/21/2010  . Allergy    rhinitis  . Anemia 02-01-12   iron absorption problem  . Arthritis 02-01-12   osteoarthritis, Back pain, spine surgeries ? retain hardware cervial and  lumbar.  . Asthma   . Back pain 01/08/2011  . Bronchitis, acute 10/31/2010  . Cancer ([place]) 02-01-12   squamous cell -face  . Depression with anxiety 01/08/2011  . Esophageal reflux   . Fatigue 06/11/2011  . Fibromyalgia   . Heart murmur   . Hyperparathyroidism ([place]) 08/18/2014  . Knee pain, right 09/20/2011  . Motion sickness 12/24/2011  . Nausea & vomiting 12/24/2011  . Obesity   . Osteopenia 08/05/2014  . Otitis media 09/20/2011  . Otitis media of both ears 11/16/2010  . Otitis media of left ear 09/19/2010  . Panic attacks   . Sinusitis 06/11/2011  . Sinusitis 12/24/2011  . Sleep apnea, obstructive   . Varicose veins of lower limb 10/02/2011   Past Surgical History:  Procedure Laterality Date  . ABDOMINAL SURGERY  02-01-12   ABDOMINAL SURGERY  . APPENDECTOMY  1962  . BARIATRIC SURGERY    . BLADDER SUSPENSION     complicated by bowel nick/ clolostomy  . BLEPHAROPLASTY Bilateral   . CARPAL TUNNEL RELEASE  02-01-12   right  . CATARACT EXTRACTION Bilateral   . CERVICAL DISC SURGERY     C-7 in 2004, C-4 and C-5 in 2010  . colostomy bag     Confirm with patient. Listed under medical conditions on form dated 07/18/09.  [name] COLOSTOMY REVERSAL  02-01-12  . EYE SURGERY     b/l cataracts and b/l blepharplasty  . [place]  . GASTRIC BYPASS  2003   Patient also noted "gastric bypass resection - 2004"  . HERNIA REPAIR  2009   Two hernias and abdominal reconstruction  . ivc filter     prophyllactically- no hx DVT  . LUMBAR DISC SURGERY     L1-L3 all had surgery most recently in 2017  . LUMBAR DISC SURGERY     L3-L4, Dr. Trenton Gammon  . pannilectomy     Per medical history form dated 07/18/09.  [name] TOTAL KNEE ARTHROPLASTY  02/11/2012   Procedure: TOTAL KNEE ARTHROPLASTY;  Surgeon: Mauri Pole, MD;  Location: [facility];  Service: Orthopedics;  Laterality: Right;  . ULNAR NERVE REPAIR Left 07/18/2016   by Dr.Henry Pool  . VULVA SURGERY  02-01-12   cyst removal-pt 8 months pregnant   Social History   Social History  . Marital status: Married    Spouse name: N/A  . Number of children: N/A  . Years of education: N/A   Social History Main Topics  . Smoking status: Never Smoker  . Smokeless tobacco: Never Used  . Alcohol  use No     Comment: special occasions  . Drug use: No  . Sexual activity: No   Other Topics Concern  . None   Social History Narrative  . None   Allergies  Allergen Reactions  . Hydrocodone Nausea And Vomiting     nausea and vomiting and headaches  . Advil [Ibuprofen] Hives and Itching  . Cephalexin      Neuropathy  . Codeine     Crazy in the head, bp drops  . Levofloxacin Itching  . Lyrica [Pregabalin] Other (See Comments)    "Makes me like a zombie. I'm awake but I'm in slow motion."  . Meperidine Hcl     B/P drops  . Morphine And Related      States" extreme migraines"  . Oxycodone-Acetaminophen Other (See Comments)    Crazy in head, drop in bp  . Sulfonamide Derivatives Nausea And Vomiting    Stomach upset  . Adhesive [Tape] Rash    Latex in adhesive tape. Please use paper tape  .  Erythromycin Diarrhea and Rash  . Penicillins Nausea And Vomiting and Rash   Family History  Problem Relation Age of Onset  . Alcohol abuse Mother   . Other Mother        accidental med overdose  . Emphysema Mother        smoked  . Bipolar disorder Mother   . Other Father        CHF  . Diabetes Father   . Cancer Father        throat ca/ smoked  . Alcohol abuse Father   . Stroke Father   . Hypertension Brother   . Hyperlipidemia Brother   . Obesity Brother   . Heart attack Maternal Grandfather   . Heart attack Paternal Grandmother   . Heart attack Paternal Grandfather   . Arthritis Son        psoriatic  . Arthritis Son        psoriatic  . Obesity Son      Past medical history, social, surgical and family history all reviewed in electronic medical record.  No pertanent information unless stated regarding to the chief complaint.   Review of Systems:Review of systems updated and as accurate as of 08/28/16  No headache, visual changes, nausea, vomiting, diarrhea, constipation, dizziness, abdominal pain, skin rash, fevers, chills, night sweats, weight loss, swollen lymph nodes, body aches, joint swelling, muscle aches, chest pain, shortness of breath, mood changes.   Objective  Weight 194 lb (88 kg). Systems examined below as of 08/28/16   General: No apparent distress alert and oriented x3 mood and affect normal, dressed appropriately.  HEENT: Pupils equal, extraocular movements intact  Respiratory: Patient's speak in full sentences and does not appear short of breath  Cardiovascular: No lower extremity edema, non tender, no erythema  Skin: Warm dry intact with no signs of infection or rash on extremities or on axial skeleton.  Abdomen: Soft nontender  Neuro: Cranial nerves II through XII are intact, neurovascularly intact in all extremities with 2+ DTRs and 2+ pulses.  Lymph: No lymphadenopathy of posterior or anterior cervical chain or axillae bilaterally.  Gait normal  with good balance and coordination.  MSK:  Non tender with full range of motion and good stability and symmetric strength and tone of shoulders, elbows, wrist, hip, and ankles bilaterally. Severe arthritic changes of multiple joints Knee: Left valgus deformity noted. Large thigh to calf ratio.  Tender to palpation over medial  and PF joint line.  ROM full in flexion and extension and lower leg rotation. instability with valgus force.  painful patellar compression. Patellar glide with moderate crepitus. Patellar and quadriceps tendons unremarkable. Hamstring and quadriceps strength is normal. Contralateral knee shows knee replacement  MSK US performed of: Right knee This study was ordered, performed, and interpreted by Charlann Boxer D.O.  Knee: Severe arthritic changes of the patellofemoral and especially the medial joint lines. Patient does have some mild bruising of the patella noted with hypoechoic changes. No true cortical defect noted. Patient does have a trace effusion noted.  IMPRESSION:  Severe knee arthritis with contusion.    Impression and Recommendations:     This case required medical decision making of moderate complexity.      Note: This dictation was prepared with Dragon dictation along with smaller phrase technology. Any transcriptional errors that result from this process are unintentional.

## 2016-08-28 NOTE — Patient Instructions (Signed)
[name]  [name] Ice 20 minutes 2 times daily. Usually after activity and before bed. Arnica lotion 2 times a day to the knees.  The swelling looks better and you should do well  See me againin 7-8 days (OK to double book if needed) so if needed we can inject the knee to help with your trip Gabapentin 200mg  at night See you maybe soon or otherwise have a great trip

## 2016-08-30 ENCOUNTER — Encounter: Payer: Self-pay | Admitting: Family Medicine

## 2016-08-30 ENCOUNTER — Ambulatory Visit (INDEPENDENT_AMBULATORY_CARE_PROVIDER_SITE_OTHER): Payer: Medicare Other | Admitting: Family Medicine

## 2016-08-30 VITALS — BP 116/60 | HR 59 | Temp 98.1°F | Resp 18 | Wt 206.8 lb

## 2016-08-30 DIAGNOSIS — J329 Chronic sinusitis, unspecified: Secondary | ICD-10-CM

## 2016-08-30 DIAGNOSIS — E559 Vitamin D deficiency, unspecified: Secondary | ICD-10-CM | POA: Diagnosis not present

## 2016-08-30 DIAGNOSIS — K439 Ventral hernia without obstruction or gangrene: Secondary | ICD-10-CM | POA: Diagnosis not present

## 2016-08-30 DIAGNOSIS — R32 Unspecified urinary incontinence: Secondary | ICD-10-CM | POA: Diagnosis not present

## 2016-08-30 DIAGNOSIS — R252 Cramp and spasm: Secondary | ICD-10-CM | POA: Diagnosis not present

## 2016-08-30 DIAGNOSIS — E782 Mixed hyperlipidemia: Secondary | ICD-10-CM

## 2016-08-30 DIAGNOSIS — E669 Obesity, unspecified: Secondary | ICD-10-CM

## 2016-08-30 DIAGNOSIS — I10 Essential (primary) hypertension: Secondary | ICD-10-CM

## 2016-08-30 DIAGNOSIS — M6289 Other specified disorders of muscle: Secondary | ICD-10-CM

## 2016-08-30 DIAGNOSIS — E1169 Type 2 diabetes mellitus with other specified complication: Secondary | ICD-10-CM

## 2016-08-30 DIAGNOSIS — Z79899 Other long term (current) drug therapy: Secondary | ICD-10-CM | POA: Diagnosis not present

## 2016-08-30 DIAGNOSIS — D509 Iron deficiency anemia, unspecified: Secondary | ICD-10-CM | POA: Diagnosis not present

## 2016-08-30 LAB — URINALYSIS
BILIRUBIN URINE: NEGATIVE
HGB URINE DIPSTICK: NEGATIVE
Ketones, ur: NEGATIVE
Leukocytes, UA: NEGATIVE
Nitrite: NEGATIVE
Specific Gravity, Urine: <=1.005 — AB (ref 1.000–1.030)
TOTAL PROTEIN, URINE-UPE24: NEGATIVE
UROBILINOGEN UA: 0.2 (ref 0.0–1.0)
Urine Glucose: NEGATIVE
pH: 5.5 (ref 5.0–8.0)

## 2016-08-30 MED ORDER — ALPRAZOLAM 0.25 MG PO TABS: 0.2500 mg | ORAL_TABLET | Freq: Every day | ORAL | 3 refills | Status: DC | PRN

## 2016-08-30 MED ORDER — DULOXETINE HCL 60 MG PO CPEP: 60.0000 mg | ORAL_CAPSULE | Freq: Every day | ORAL | 1 refills | Status: DC

## 2016-08-30 MED ORDER — CEFDINIR 300 MG PO CAPS: 300.0000 mg | ORAL_CAPSULE | Freq: Two times a day (BID) | ORAL | 0 refills | Status: AC

## 2016-08-30 NOTE — Progress Notes (Signed)
Subjective:  I acted as a Education administrator for Dr. Charlett Blake. [name], [place]  Patient ID: [name], female    DOB: 26-Jan-1945, 71 y.o.   MRN: [id number]  No chief complaint on file.   HPI  Patient is in today for a follow up,. Patient c/o the right side of her face, legs and hands being numb. She also states her incontinence is not getting better. No recent febrile illness or acute hospitalizations. Denies CP/palp/SOB/HA/congestion/fevers/GI or GU c/o. Taking meds as prescribed.struggles with an increased sense of PICA, craving nubs in the past has led to the discovery of anemia, so she is curious.      Patient Care Team: Mosie Lukes, MD as PCP - General (Family Medicine) Lyndal Pulley, DO as Attending Physician (Family Medicine) Deneise Lever, MD as Consulting Physician (Pulmonary Disease) [place], Jennefer Bravo, MD as Referring Physician (Dermatology) Leo Grosser Seymour Bars, MD as Consulting Physician (Obstetrics and Gynecology) Adrian Prows, MD as Consulting Physician (Cardiology) Alanda Slim Neena Rhymes, MD as Consulting Physician (Ophthalmology)   Past Medical History:  Diagnosis Date  . Abdominal pain 08/18/2014  . Allergic state 09/21/2010  . Allergy    rhinitis  . Anemia 02-01-12   iron absorption problem  . Arthritis 02-01-12   osteoarthritis, Back pain, spine surgeries ? retain hardware cervial and  lumbar.  . Asthma   . Back pain 01/08/2011  . Bronchitis, acute 10/31/2010  . Cancer ([place]) 02-01-12   squamous cell -face  . Depression with anxiety 01/08/2011  . Esophageal reflux   . Fatigue 06/11/2011  . Fibromyalgia   . Heart murmur   . Hyperparathyroidism ([place]) 08/18/2014  . Knee pain, right 09/20/2011  . Motion sickness 12/24/2011  . Nausea & vomiting 12/24/2011  . Obesity   . Osteopenia 08/05/2014  . Otitis media 09/20/2011  . Otitis media of both ears 11/16/2010  . Otitis media of left ear 09/19/2010  . Panic attacks   . Sinusitis 06/11/2011  . Sinusitis 12/24/2011  .  Sleep apnea, obstructive   . Varicose veins of lower limb 10/02/2011    Past Surgical History:  Procedure Laterality Date  . ABDOMINAL SURGERY  02-01-12   ABDOMINAL SURGERY  . APPENDECTOMY  1962  . BARIATRIC SURGERY    . BLADDER SUSPENSION     complicated by bowel nick/ clolostomy  . BLEPHAROPLASTY Bilateral   . CARPAL TUNNEL RELEASE  02-01-12   right  . CATARACT EXTRACTION Bilateral   . CERVICAL DISC SURGERY     C-7 in 2004, C-4 and C-5 in 2010  . colostomy bag     Confirm with patient. Listed under medical conditions on form dated 07/18/09.  [name] COLOSTOMY REVERSAL  02-01-12  . EYE SURGERY     b/l cataracts and b/l blepharplasty  . [place]  . GASTRIC BYPASS  2003   Patient also noted "gastric bypass resection - 2004"  . HERNIA REPAIR  2009   Two hernias and abdominal reconstruction  . ivc filter     prophyllactically- no hx DVT  . LUMBAR DISC SURGERY     L1-L3 all had surgery most recently in 2017  . LUMBAR DISC SURGERY     L3-L4, Dr. Trenton Gammon  . pannilectomy     Per medical history form dated 07/18/09.  [name] TOTAL KNEE ARTHROPLASTY  02/11/2012   Procedure: TOTAL KNEE ARTHROPLASTY;  Surgeon: Mauri Pole, MD;  Location: [facility];  Service: Orthopedics;  Laterality: Right;  . ULNAR NERVE REPAIR  Left 07/18/2016   by Dr.Henry Pool  . VULVA SURGERY  02-01-12   cyst removal-pt 8 months pregnant    Family History  Problem Relation Age of Onset  . Alcohol abuse Mother   . Other Mother        accidental med overdose  . Emphysema Mother        smoked  . Bipolar disorder Mother   . Other Father        CHF  . Diabetes Father   . Cancer Father        throat ca/ smoked  . Alcohol abuse Father   . Stroke Father   . Hypertension Brother   . Hyperlipidemia Brother   . Obesity Brother   . Heart attack Maternal Grandfather   . Heart attack Paternal Grandmother   . Heart attack Paternal Grandfather   . Arthritis Son        psoriatic  . Arthritis Son         psoriatic  . Obesity Son     Social History   Social History  . Marital status: Married    Spouse name: N/A  . Number of children: N/A  . Years of education: N/A   Occupational History  . Not on file.   Social History Main Topics  . Smoking status: Never Smoker  . Smokeless tobacco: Never Used  . Alcohol use No     Comment: special occasions  . Drug use: No  . Sexual activity: No   Other Topics Concern  . Not on file   Social History Narrative  . No narrative on file    Outpatient Medications Prior to Visit  Medication Sig Dispense Refill  . ALPRAZolam (XANAX) 0.25 MG tablet Take 0.25 mg by mouth daily as needed. For anxiety    . amphetamine-dextroamphetamine (ADDERALL XR) 25 MG 24 hr capsule Take 1 capsule by mouth every morning. 30 capsule 0  . aspirin 81 MG tablet Take 81 mg by mouth daily.    [name] atorvastatin (LIPITOR) 10 MG tablet Take 10 mg by mouth daily.    [name] azelastine (ASTELIN) 0.1 % nasal spray 1-2 puffs each nostril once or twice daily as needed 30 mL 12  . DULoxetine (CYMBALTA) 60 MG capsule TAKE 1 CAPSULE BY MOUTH ONCE DAILY 90 capsule 0  . fluticasone (FLONASE) 50 MCG/ACT nasal spray 1-2 puffs each nostril once or twice daily as neededs 16 g 12  . gabapentin (NEURONTIN) 100 MG capsule Take 2 capsules (200 mg total) by mouth at bedtime. 60 capsule 3  . ipratropium (ATROVENT) 0.06 % nasal spray 1-2 puffs each nostril once or twice daily as needed 15 mL 12  . NON FORMULARY CPAP- set on 12 [employer].    . NONFORMULARY OR COMPOUNDED ITEM Allergy Vaccine 1:10 Given at Home    . nortriptyline (PAMELOR) 10 MG capsule Take 1 capsule (10 mg total) by mouth at bedtime. 30 capsule 3  . OPTIMAL-D 37342 units capsule TAKE 2 CAPSULES BY MOUTH ONCE A WEEK 8 capsule 4  . ranitidine (ZANTAC) 150 MG tablet 1 tablet qam    . traZODone (DESYREL) 100 MG tablet TAKE ONE-HALF TO ONE TABLET BY MOUTH ONCE DAILY AS NEEDED FOR SLEEP AS DIRECTED 90 tablet 1  . VESICARE 10 MG tablet TAKE  1 TABLET BY MOUTH ONCE DAILY 30 tablet 3   No facility-administered medications prior to visit.     Allergies  Allergen Reactions  . Hydrocodone Nausea And Vomiting  nausea and vomiting and headaches  . Advil [Ibuprofen] Hives and Itching  . Cephalexin      Neuropathy  . Codeine     Crazy in the head, bp drops  . Levofloxacin Itching  . Lyrica [Pregabalin] Other (See Comments)    "Makes me like a zombie. I'm awake but I'm in slow motion."  . Meperidine Hcl     B/P drops  . Morphine And Related      States" extreme migraines"  . Oxycodone-Acetaminophen Other (See Comments)    Crazy in head, drop in bp  . Sulfonamide Derivatives Nausea And Vomiting    Stomach upset  . Adhesive [Tape] Rash    Latex in adhesive tape. Please use paper tape  . Erythromycin Diarrhea and Rash  . Penicillins Nausea And Vomiting and Rash    Review of Systems  Constitutional: Positive for malaise/fatigue. Negative for fever.  HENT: Negative for congestion.   Eyes: Negative for blurred vision.  Respiratory: Negative for cough and shortness of breath.   Cardiovascular: Negative for chest pain, palpitations and leg swelling.  Gastrointestinal: Negative for vomiting.  Musculoskeletal: Positive for myalgias. Negative for back pain.  Skin: Negative for rash.  Neurological: Negative for loss of consciousness and headaches.       Objective:    Physical Exam  Constitutional: She is oriented to person, place, and time. She appears well-developed and well-nourished. No distress.  HENT:  Head: Normocephalic and atraumatic.  Eyes: Conjunctivae are normal.  Neck: Normal range of motion. No thyromegaly present.  Cardiovascular: Normal rate and regular rhythm.   Pulmonary/Chest: Effort normal and breath sounds normal. She has no wheezes.  Abdominal: Soft. Bowel sounds are normal. There is no tenderness.  Musculoskeletal: Normal range of motion. She exhibits no edema or deformity.  Neurological: She is  alert and oriented to person, place, and time.  Skin: Skin is warm and dry. She is not diaphoretic. There is pallor.  Psychiatric: She has a normal mood and affect.  Insomnia.    There were no vitals taken for this visit. Wt Readings from Last 3 Encounters:  08/28/16 194 lb (88 kg)  08/23/16 202 lb 9.6 oz (91.9 kg)  05/02/16 195 lb 6.4 oz (88.6 kg)   BP Readings from Last 3 Encounters:  08/28/16 128/70  08/23/16 123/89  05/02/16 (!) 110/50     Immunization History  Administered Date(s) Administered  . DTaP 01/23/1968  . Influenza Split 10/24/2010, 10/16/2011, 10/25/2015  . Influenza Whole 11/08/2008, 10/22/2009  . Influenza, High Dose Seasonal PF 10/23/2014  . Influenza,inj,Quad PF,36+ Mos 11/07/2012  . Pneumococcal Conjugate-13 06/08/2013  . Pneumococcal Polysaccharide-23 10/22/2009, 08/22/2015  . Tdap 10/23/2006, 11/17/2013  . Zoster 01/23/2004    Health Maintenance  Topic Date Due  . FOOT EXAM  02/12/1957  . URINE MICROALBUMIN  02/04/2016  . OPHTHALMOLOGY EXAM  07/27/2016  . INFLUENZA VACCINE  08/22/2016  . HEMOGLOBIN A1C  09/11/2016  . MAMMOGRAM  04/24/2018  . COLONOSCOPY  09/23/2019  . TETANUS/TDAP  11/18/2023  . DEXA SCAN  Completed  . Hepatitis C Screening  Completed  . PNA vac Low Risk Adult  Completed    Lab Results  Component Value Date   WBC 10.1 03/14/2016   HGB 11.1 (L) 03/14/2016   HCT 33.6 (L) 03/14/2016   PLT 280.0 03/14/2016   GLUCOSE 91 03/14/2016   CHOL 138 03/14/2016   TRIG 67.0 03/14/2016   HDL 56.50 03/14/2016   LDLCALC 68 03/14/2016   ALT 18 03/14/2016  AST 14 03/14/2016   NA 143 03/14/2016   K 3.6 03/14/2016   CL 109 03/14/2016   CREATININE 0.77 03/14/2016   BUN 22 03/14/2016   CO2 28 03/14/2016   TSH 3.21 03/14/2016   INR 0.98 02/01/2012   HGBA1C 5.8 03/14/2016   MICROALBUR 0.7 02/04/2015    Lab Results  Component Value Date   TSH 3.21 03/14/2016   Lab Results  Component Value Date   WBC 10.1 03/14/2016   HGB 11.1  (L) 03/14/2016   HCT 33.6 (L) 03/14/2016   MCV 87.5 03/14/2016   PLT 280.0 03/14/2016   Lab Results  Component Value Date   NA 143 03/14/2016   K 3.6 03/14/2016   CO2 28 03/14/2016   GLUCOSE 91 03/14/2016   BUN 22 03/14/2016   CREATININE 0.77 03/14/2016   BILITOT 0.5 03/14/2016   ALKPHOS 81 03/14/2016   AST 14 03/14/2016   ALT 18 03/14/2016   PROT 6.3 03/14/2016   ALBUMIN 4.0 03/14/2016   CALCIUM 8.8 03/14/2016   GFR 78.98 03/14/2016   Lab Results  Component Value Date   CHOL 138 03/14/2016   Lab Results  Component Value Date   HDL 56.50 03/14/2016   Lab Results  Component Value Date   LDLCALC 68 03/14/2016   Lab Results  Component Value Date   TRIG 67.0 03/14/2016   Lab Results  Component Value Date   CHOLHDL 2 03/14/2016   Lab Results  Component Value Date   HGBA1C 5.8 03/14/2016         Assessment & Plan:   Problem List Items Addressed This Visit    None      I am having Ms. Menchaca maintain her NON FORMULARY, ALPRAZolam, atorvastatin, aspirin, ranitidine, NONFORMULARY OR COMPOUNDED ITEM, nortriptyline, OPTIMAL-D, ipratropium, amphetamine-dextroamphetamine, fluticasone, azelastine, traZODone, DULoxetine, VESICARE, and gabapentin.  No orders of the defined types were placed in this encounter.    Magdalene Molly, [place]

## 2016-08-30 NOTE — Assessment & Plan Note (Signed)
Worse over several months. Proceed with labs

## 2016-08-30 NOTE — Assessment & Plan Note (Signed)
Encouraged DASH diet, decrease po intake and increase exercise as tolerated. Needs 7-8 hours of sleep nightly. Avoid trans fats, eat small, frequent meals every 4-5 hours with lean proteins, complex carbs and healthy fats. Minimize simple carbs, bariatric referral 

## 2016-08-30 NOTE — Assessment & Plan Note (Addendum)
hgba1c acceptable, minimize simple carbs. Increase exercise as tolerated. Continue current meds 

## 2016-08-30 NOTE — Assessment & Plan Note (Signed)
Check level today. Taking her Vit D infrequently

## 2016-08-30 NOTE — Assessment & Plan Note (Signed)
encouraged heart healthy diet, avoid trans fats, minimize simple carbs and saturated fats. Increase exercise as tolerated 

## 2016-08-30 NOTE — Assessment & Plan Note (Signed)
She feels her PICA is returning with increased craving of nuts

## 2016-08-30 NOTE — Patient Instructions (Addendum)
[email or web address] Liquid Vitamin D   Plain mucinex twice daily, zinc, vitamin C 1000 mg daily Sinusitis, Adult Sinusitis is soreness and inflammation of your sinuses. Sinuses are hollow spaces in the bones around your face. They are located:  Around your eyes.  In the middle of your forehead.  Behind your nose.  In your cheekbones.  Your sinuses and nasal passages are lined with a stringy fluid (mucus). Mucus normally drains out of your sinuses. When your nasal tissues get inflamed or swollen, the mucus can get trapped or blocked so air cannot flow through your sinuses. This lets bacteria, viruses, and funguses grow, and that leads to infection. Follow these instructions at home: Medicines  Take, use, or apply over-the-counter and prescription medicines only as told by your doctor. These may include nasal sprays.  If you were prescribed an antibiotic medicine, take it as told by your doctor. Do not stop taking the antibiotic even if you start to feel better. Hydrate and Humidify  Drink enough water to keep your pee (urine) clear or pale yellow.  Use a cool mist humidifier to keep the humidity level in your home above 50%.  Breathe in steam for 10-15 minutes, 3-4 times a day or as told by your doctor. You can do this in the bathroom while a hot shower is running.  Try not to spend time in cool or dry air. Rest  Rest as much as possible.  Sleep with your head raised (elevated).  Make sure to get enough sleep each night. General instructions  Put a warm, moist washcloth on your face 3-4 times a day or as told by your doctor. This will help with discomfort.  Wash your hands often with soap and water. If there is no soap and water, use hand sanitizer.  Do not smoke. Avoid being around people who are smoking (secondhand smoke).  Keep all follow-up visits as told by your doctor. This is important. Contact a doctor if:  You have a fever.  Your  symptoms get worse.  Your symptoms do not get better within 10 days. Get help right away if:  You have a very bad headache.  You cannot stop throwing up (vomiting).  You have pain or swelling around your face or eyes.  You have trouble seeing.  You feel confused.  Your neck is stiff.  You have trouble breathing. This information is not intended to replace advice given to you by your health care provider. Make sure you discuss any questions you have with your health care provider. Document Released: 06/27/2007 Document Revised: 09/04/2015 Document Reviewed: 11/03/2014 Elsevier Interactive Patient Education  [employer].

## 2016-08-31 LAB — URINE CULTURE

## 2016-09-02 ENCOUNTER — Encounter: Payer: Self-pay | Admitting: Family Medicine

## 2016-09-02 DIAGNOSIS — M6289 Other specified disorders of muscle: Secondary | ICD-10-CM

## 2016-09-02 HISTORY — DX: Other specified disorders of muscle: M62.89

## 2016-09-02 NOTE — Assessment & Plan Note (Signed)
Well controlled, no changes to meds. Encouraged heart healthy diet such as the DASH diet and exercise as tolerated.  °

## 2016-09-02 NOTE — Assessment & Plan Note (Signed)
With some intermittent urine and stool incontinence. Encouraged hi fiber and fluid diet  Encouraged kebel exercises.

## 2016-09-04 ENCOUNTER — Ambulatory Visit: Payer: Self-pay

## 2016-09-04 ENCOUNTER — Ambulatory Visit (INDEPENDENT_AMBULATORY_CARE_PROVIDER_SITE_OTHER): Payer: Medicare Other | Admitting: Family Medicine

## 2016-09-04 ENCOUNTER — Encounter: Payer: Self-pay | Admitting: Family Medicine

## 2016-09-04 VITALS — BP 128/82 | HR 79 | Ht 60.0 in | Wt 209.0 lb

## 2016-09-04 DIAGNOSIS — M1712 Unilateral primary osteoarthritis, left knee: Secondary | ICD-10-CM | POA: Diagnosis not present

## 2016-09-04 DIAGNOSIS — G8929 Other chronic pain: Secondary | ICD-10-CM

## 2016-09-04 DIAGNOSIS — M25561 Pain in right knee: Principal | ICD-10-CM

## 2016-09-04 DIAGNOSIS — M7050 Other bursitis of knee, unspecified knee: Secondary | ICD-10-CM | POA: Diagnosis not present

## 2016-09-04 NOTE — Patient Instructions (Signed)
[name]  Have a good trip  Ice 20 minutes 2 times daily. Usually after activity and before bed. Exercises 3 times a week.  See me again in 3-4 weeks. Can start other injections as well

## 2016-09-04 NOTE — Assessment & Plan Note (Signed)
Patient given an injection. Tolerated the procedure well with near complete resolution of pain. Discussed icing regimen. Which activities to do in which ones to avoid. Patient will try to do the conservative therapy and follow-up with me again in 4 weeks. Could be a candidate for viscous supplementation

## 2016-09-04 NOTE — Assessment & Plan Note (Signed)
Patient given a repeat injection today. Had been 2 years since her previous one. Discussed icing regimen, home exercises, which activities to do a which was to avoid. Patient will try compression. Follow-up with me in 4 weeks

## 2016-09-04 NOTE — Progress Notes (Signed)
[name] [place], [place] [address] Phone: [phone] Subjective:    I'm seeing this patient by the request  of:  Mosie Lukes, MD   CC: Knee pain f/u  [id number]  [name] is a 71 y.o. female coming in with complaint of knee pain.  Seems to be mostly on the left side. Hasn't right knee replacement. Patient was having worsening pain again. Some increasing instability.   Patient is having worsening pain in the right knee as well. Seems to be more medial. Over the pes anserine area. Has had injections on this previously. Worsening pain and will be doing a lot of walking. Past Medical History:  Diagnosis Date  . Abdominal pain 08/18/2014  . Allergic state 09/21/2010  . Allergy    rhinitis  . Anemia 02-01-12   iron absorption problem  . Arthritis 02-01-12   osteoarthritis, Back pain, spine surgeries ? retain hardware cervial and  lumbar.  . Asthma   . Back pain 01/08/2011  . Bronchitis, acute 10/31/2010  . Cancer ([place]) 02-01-12   squamous cell -face  . Depression with anxiety 01/08/2011  . Esophageal reflux   . Fatigue 06/11/2011  . Fibromyalgia   . Heart murmur   . Hyperparathyroidism ([place]) 08/18/2014  . Knee pain, right 09/20/2011  . Motion sickness 12/24/2011  . Muscle cramps 08/30/2016  . Nausea & vomiting 12/24/2011  . Obesity   . Osteopenia 08/05/2014  . Otitis media 09/20/2011  . Otitis media of both ears 11/16/2010  . Otitis media of left ear 09/19/2010  . Panic attacks   . Pelvic floor dysfunction 09/02/2016  . Sinusitis 06/11/2011  . Sinusitis 12/24/2011  . Sleep apnea, obstructive   . Varicose veins of lower limb 10/02/2011   Past Surgical History:  Procedure Laterality Date  . ABDOMINAL SURGERY  02-01-12   ABDOMINAL SURGERY  . APPENDECTOMY  1962  . BARIATRIC SURGERY    . BLADDER SUSPENSION     complicated by bowel nick/ clolostomy  . BLEPHAROPLASTY Bilateral   . CARPAL TUNNEL RELEASE  02-01-12   right  .  CATARACT EXTRACTION Bilateral   . CERVICAL DISC SURGERY     C-7 in 2004, C-4 and C-5 in 2010  . colostomy bag     Confirm with patient. Listed under medical conditions on form dated 07/18/09.  [name] COLOSTOMY REVERSAL  02-01-12  . EYE SURGERY     b/l cataracts and b/l blepharplasty  . [place]  . GASTRIC BYPASS  2003   Patient also noted "gastric bypass resection - 2004"  . HERNIA REPAIR  2009   Two hernias and abdominal reconstruction  . ivc filter     prophyllactically- no hx DVT  . LUMBAR DISC SURGERY     L1-L3 all had surgery most recently in 2017  . LUMBAR DISC SURGERY     L3-L4, Dr. Trenton Gammon  . pannilectomy     Per medical history form dated 07/18/09.  [name] TOTAL KNEE ARTHROPLASTY  02/11/2012   Procedure: TOTAL KNEE ARTHROPLASTY;  Surgeon: Mauri Pole, MD;  Location: [facility];  Service: Orthopedics;  Laterality: Right;  . ULNAR NERVE REPAIR Left 07/18/2016   by Dr.Henry Pool  . VULVA SURGERY  02-01-12   cyst removal-pt 8 months pregnant   Social History   Social History  . Marital status: Married    Spouse name: N/A  . Number of children: N/A  . Years of education: N/A  Social History Main Topics  . Smoking status: Never Smoker  . Smokeless tobacco: Never Used  . Alcohol use No     Comment: special occasions  . Drug use: No  . Sexual activity: No   Other Topics Concern  . Not on file   Social History Narrative  . No narrative on file   Allergies  Allergen Reactions  . Hydrocodone Nausea And Vomiting     nausea and vomiting and headaches  . Advil [Ibuprofen] Hives and Itching  . Cephalexin      Neuropathy  . Codeine     Crazy in the head, bp drops  . Levofloxacin Itching  . Lyrica [Pregabalin] Other (See Comments)    "Makes me like a zombie. I'm awake but I'm in slow motion."  . Meperidine Hcl     B/P drops  . Morphine And Related      States" extreme migraines"  . Oxycodone-Acetaminophen Other (See Comments)    Crazy in head, drop in bp  .  Sulfonamide Derivatives Nausea And Vomiting    Stomach upset  . Adhesive [Tape] Rash    Latex in adhesive tape. Please use paper tape  . Erythromycin Diarrhea and Rash  . Penicillins Nausea And Vomiting and Rash   Family History  Problem Relation Age of Onset  . Alcohol abuse Mother   . Other Mother        accidental med overdose  . Emphysema Mother        smoked  . Bipolar disorder Mother   . Other Father        CHF  . Diabetes Father   . Cancer Father        throat ca/ smoked  . Alcohol abuse Father   . Stroke Father   . Hypertension Brother   . Hyperlipidemia Brother   . Obesity Brother   . Heart attack Maternal Grandfather   . Heart attack Paternal Grandmother   . Heart attack Paternal Grandfather   . Arthritis Son        psoriatic  . Arthritis Son        psoriatic  . Obesity Son      Past medical history, social, surgical and family history all reviewed in electronic medical record.  No pertanent information unless stated regarding to the chief complaint.   Review of Systems: No headache, visual changes, nausea, vomiting, diarrhea, constipation, dizziness, abdominal pain, skin rash, fevers, chills, night sweats, weight loss, swollen lymph nodes, body aches, joint swelling, chest pain, shortness of breath, mood changes.  Positive muscle aches  Objective  There were no vitals taken for this visit.   Systems examined below as of 09/04/16 General: NAD A&O x3 mood, affect normal  HEENT: Pupils equal, extraocular movements intact no nystagmus Respiratory: not short of breath at rest or with speaking Cardiovascular: No lower extremity edema, non tender Skin: Warm dry intact with no signs of infection or rash on extremities or on axial skeleton. Abdomen: Soft nontender, no masses Neuro: Cranial nerves  intact, neurovascularly intact in all extremities with 2+ DTRs and 2+ pulses. Lymph: No lymphadenopathy appreciated today  Gait normal with good balance and  coordination.  MSK: Non tender with full range of motion and good stability and symmetric strength and tone of shoulders, elbows, wrist,  hips and ankles bilaterally.  Arthritic changes of multiple joints Knee: Left valgus deformity noted. Large thigh to calf ratio.  Tender to palpation over medial and PF joint line.  ROM  full in flexion and extension and lower leg rotation. instability with valgus force.  painful patellar compression. Patellar glide with moderate crepitus. Patellar and quadriceps tendons unremarkable. Hamstring and quadriceps strength is normal. Contralateral knee shows replacement tender over the pes anserine area no true instability noted  After informed written and verbal consent, patient was seated on exam table. Left knee was prepped with alcohol swab and utilizing anterolateral approach, patient's left knee space was injected with 4:1  marcaine 0.5%: Kenalog 40mg /dL. Patient tolerated the procedure well without immediate complications.  Procedure: Real-time Ultrasound Guided Injection of right pes anserine Device: GE Logiq Q7 Ultrasound guided injection is preferred based studies that show increased duration, increased effect, greater accuracy, decreased procedural pain, increased response rate, and decreased cost with ultrasound guided versus blind injection.  Verbal informed consent obtained.  Time-out conducted.  Noted no overlying erythema, induration, or other signs of local infection.  Skin prepped in a sterile fashion.  Local anesthesia: Topical Ethyl chloride.  With sterile technique and under real time ultrasound guidance: With a 25-gauge half-inch needle patient was injected with a total of 0.5 mL of 0.5% Marcaine and 0.5 mL of Kenalog 40 g/dL   Completed without difficulty  Pain immediately resolved suggesting accurate placement of the medication.  Advised to call if fevers/chills, erythema, induration, drainage, or persistent bleeding.  Images  permanently stored and available for review in the ultrasound unit.  Impression: Technically successful ultrasound guided injection.   Impression and Recommendations:     This case required medical decision making of moderate complexity.      Note: This dictation was prepared with Dragon dictation along with smaller phrase technology. Any transcriptional errors that result from this process are unintentional.

## 2016-09-06 ENCOUNTER — Other Ambulatory Visit (INDEPENDENT_AMBULATORY_CARE_PROVIDER_SITE_OTHER): Payer: Medicare Other

## 2016-09-06 DIAGNOSIS — E1169 Type 2 diabetes mellitus with other specified complication: Secondary | ICD-10-CM | POA: Diagnosis not present

## 2016-09-06 DIAGNOSIS — R252 Cramp and spasm: Secondary | ICD-10-CM | POA: Diagnosis not present

## 2016-09-06 DIAGNOSIS — E782 Mixed hyperlipidemia: Secondary | ICD-10-CM

## 2016-09-06 DIAGNOSIS — E669 Obesity, unspecified: Secondary | ICD-10-CM | POA: Diagnosis not present

## 2016-09-06 DIAGNOSIS — D509 Iron deficiency anemia, unspecified: Secondary | ICD-10-CM

## 2016-09-06 LAB — COMPREHENSIVE METABOLIC PANEL
ALBUMIN: 3.6 g/dL (ref 3.5–5.2)
ALT: 15 U/L (ref 0–35)
AST: 15 U/L (ref 0–37)
Alkaline Phosphatase: 92 U/L (ref 39–117)
BILIRUBIN TOTAL: 0.4 mg/dL (ref 0.2–1.2)
BUN: 18 mg/dL (ref 6–23)
CALCIUM: 8.7 mg/dL (ref 8.4–10.5)
CHLORIDE: 106 meq/L (ref 96–112)
CO2: 29 meq/L (ref 19–32)
CREATININE: 0.75 mg/dL (ref 0.40–1.20)
GFR: 81.3 mL/min (ref >60.00)
Glucose, Bld: 119 mg/dL — ABNORMAL HIGH (ref 70–99)
Potassium: 4.1 mEq/L (ref 3.5–5.1)
SODIUM: 142 meq/L (ref 135–145)
Total Protein: 6.1 g/dL (ref 6.0–8.3)

## 2016-09-06 LAB — LIPID PANEL
Cholesterol: 120 mg/dL (ref 0–200)
HDL: 57.3 mg/dL (ref >39.00)
LDL CALC: 53 mg/dL (ref 0–99)
NONHDL: 62.66
Total CHOL/HDL Ratio: 2
Triglycerides: 47 mg/dL (ref 0.0–149.0)
VLDL: 9.4 mg/dL (ref 0.0–40.0)

## 2016-09-06 LAB — MAGNESIUM: MAGNESIUM: 2 mg/dL (ref 1.5–2.5)

## 2016-09-06 LAB — HEMOGLOBIN A1C: HEMOGLOBIN A1C: 5.9 % (ref 4.6–6.5)

## 2016-09-06 LAB — CBC
HCT: 34.1 % — ABNORMAL LOW (ref 36.0–46.0)
Hemoglobin: 10.9 g/dL — ABNORMAL LOW (ref 12.0–15.0)
MCHC: 32.1 g/dL (ref 30.0–36.0)
MCV: 86.8 fl (ref 78.0–100.0)
PLATELETS: 281 10*3/uL (ref 150.0–400.0)
RBC: 3.93 Mil/uL (ref 3.87–5.11)
RDW: 15.5 % (ref 11.5–15.5)
WBC: 5.6 10*3/uL (ref 4.0–10.5)

## 2016-09-06 LAB — TSH: TSH: 2.99 u[IU]/mL (ref 0.35–4.50)

## 2016-09-18 DIAGNOSIS — K439 Ventral hernia without obstruction or gangrene: Secondary | ICD-10-CM | POA: Diagnosis not present

## 2016-09-19 ENCOUNTER — Encounter: Payer: Self-pay | Admitting: Family Medicine

## 2016-09-27 ENCOUNTER — Encounter: Payer: Self-pay | Admitting: Allergy

## 2016-09-27 ENCOUNTER — Ambulatory Visit (INDEPENDENT_AMBULATORY_CARE_PROVIDER_SITE_OTHER): Payer: Medicare Other | Admitting: Allergy

## 2016-09-27 VITALS — BP 128/70 | HR 72 | Temp 98.4°F | Resp 19 | Ht 59.5 in | Wt 204.2 lb

## 2016-09-27 DIAGNOSIS — J309 Allergic rhinitis, unspecified: Secondary | ICD-10-CM

## 2016-09-27 DIAGNOSIS — H1010 Acute atopic conjunctivitis, unspecified eye: Secondary | ICD-10-CM | POA: Diagnosis not present

## 2016-09-27 DIAGNOSIS — Z8709 Personal history of other diseases of the respiratory system: Secondary | ICD-10-CM

## 2016-09-27 NOTE — Progress Notes (Signed)
New Patient Note  RE: [name] MRN: [id number] DOB: 29-Jul-1945 Date of Office Visit: 09/27/2016  Referring provider: Mosie Lukes, MD Primary care provider: Mosie Lukes, MD  Chief Complaint: allergic rhinoconjunctivitis  History of present illness: [name] is a 71 y.o. female presenting today for consultation for allergic rhinoconjunctivitis.    She had been seeing Dr. Annamaria Boots for allergic rhinitis and has been on allergen immunotherapy since the 1970s.   He is no longer providing allergen immunotherapy thus she presents here for evaluation.  She reports her husband has been giving her her shots at home and she denies any local or systemic symptoms.  She feels she last had allergy testing done was about 10 years.  She reports having symptoms of watery eyes, PND, ear fullness, HA.    She also gets hives that are very itchy.  Her current regimen is daily xyzal, flonase, astelin and ipratropium.   She has been off Xyzal since the weekend and states she has been very itchy and having more PND.       She reports she used to have asthma when she was younger but has not had any day or nighttime symptoms.   She reports she would get several episodes of bronchitis a year but has not had issues in over 5 years.  She has no inhalers.  She does have OSA and uses CPAP that is still managed by Dr. Annamaria Boots.    She does have history of remote eczema.  No history of food allergy but does reports she limits amount of milk ingestion.  She had gastric bypass surgery and feels this may have affected how she tolerates certain foods.   Review of systems: Review of Systems  Constitutional: Negative for chills, fever and malaise/fatigue.  HENT: Positive for congestion. Negative for ear discharge, ear pain, nosebleeds, sinus pain, sore throat and tinnitus.   Eyes: Negative for pain, discharge and redness.  Respiratory: Negative for cough, shortness of breath and wheezing.   Cardiovascular:  Negative for chest pain.  Gastrointestinal: Negative for abdominal pain, constipation, diarrhea, heartburn, nausea and vomiting.  Musculoskeletal: Negative for joint pain.  Skin: Positive for itching. Negative for rash.  Neurological: Negative for headaches.    All other systems negative unless noted above in HPI  Past medical history: Past Medical History:  Diagnosis Date  . Abdominal pain 08/18/2014  . Allergic state 09/21/2010  . Allergy    rhinitis  . Anemia 02-01-12   iron absorption problem  . Arthritis 02-01-12   osteoarthritis, Back pain, spine surgeries ? retain hardware cervial and  lumbar.  . Asthma   . Back pain 01/08/2011  . Bronchitis, acute 10/31/2010  . Cancer ([place]) 02-01-12   squamous cell -face  . Depression with anxiety 01/08/2011  . Esophageal reflux   . Fatigue 06/11/2011  . Fibromyalgia   . Heart murmur   . Hyperparathyroidism ([place]) 08/18/2014  . Knee pain, right 09/20/2011  . Motion sickness 12/24/2011  . Muscle cramps 08/30/2016  . Nausea & vomiting 12/24/2011  . Obesity   . Osteopenia 08/05/2014  . Otitis media 09/20/2011  . Otitis media of both ears 11/16/2010  . Otitis media of left ear 09/19/2010  . Panic attacks   . Pelvic floor dysfunction 09/02/2016  . Sinusitis 06/11/2011  . Sinusitis 12/24/2011  . Sleep apnea, obstructive   . Varicose veins of lower limb 10/02/2011    Past surgical history: Past Surgical History:  Procedure Laterality  Date  . ABDOMINAL SURGERY  02-01-12   ABDOMINAL SURGERY  . APPENDECTOMY  1962  . BARIATRIC SURGERY    . BLADDER SUSPENSION     complicated by bowel nick/ clolostomy  . BLEPHAROPLASTY Bilateral   . CARPAL TUNNEL RELEASE  02-01-12   right  . CATARACT EXTRACTION Bilateral   . CERVICAL DISC SURGERY     C-7 in 2004, C-4 and C-5 in 2010  . colostomy bag     Confirm with patient. Listed under medical conditions on form dated 07/18/09.  [name] COLOSTOMY REVERSAL  02-01-12  . EYE SURGERY     b/l cataracts and b/l  blepharplasty  . [place]  . GASTRIC BYPASS  2003   Patient also noted "gastric bypass resection - 2004"  . HERNIA REPAIR  2009   Two hernias and abdominal reconstruction  . ivc filter     prophyllactically- no hx DVT  . LUMBAR DISC SURGERY     L1-L3 all had surgery most recently in 2017  . LUMBAR DISC SURGERY     L3-L4, Dr. Trenton Gammon  . pannilectomy     Per medical history form dated 07/18/09.  [name] TOTAL KNEE ARTHROPLASTY  02/11/2012   Procedure: TOTAL KNEE ARTHROPLASTY;  Surgeon: Mauri Pole, MD;  Location: [facility];  Service: Orthopedics;  Laterality: Right;  . ULNAR NERVE REPAIR Left 07/18/2016   by Dr.Henry Pool  . VULVA SURGERY  02-01-12   cyst removal-pt 8 months pregnant    Family history:  Family History  Problem Relation Age of Onset  . Alcohol abuse Mother   . Other Mother        accidental med overdose  . Emphysema Mother        smoked  . Bipolar disorder Mother   . Other Father        CHF  . Diabetes Father   . Cancer Father        throat ca/ smoked  . Alcohol abuse Father   . Stroke Father   . Hypertension Brother   . Hyperlipidemia Brother   . Obesity Brother   . Heart attack Maternal Grandfather   . Heart attack Paternal Grandmother   . Heart attack Paternal Grandfather   . Arthritis Son        psoriatic  . Arthritis Son        psoriatic  . Obesity Son     Social history: She lives in a home with carpeting with gas heating and central cooling. There is a dog in the home. There is no concern for water damage, mildew roaches in the home. She works as an Sport and exercise psychologist. She has no smoking history.   Medication List: Allergies as of 09/27/2016      Reactions   Hydrocodone Nausea And Vomiting    nausea and vomiting and headaches   Advil [ibuprofen] Hives, Itching   Amoxicillin Hives   Cephalexin     Neuropathy   Codeine    Crazy in the head, bp drops   Levofloxacin Itching   Lyrica [pregabalin] Other (See Comments)    "Makes me like a zombie. I'm awake but I'm in slow motion."   Meperidine Hcl    B/P drops   Morphine And Related     States" extreme migraines"   Oxycodone-acetaminophen Other (See Comments)   Crazy in head, drop in bp   Oxycodone-acetaminophen Other (See Comments)   Sulfonamide Derivatives Nausea And Vomiting   Stomach upset  Adhesive [tape] Rash   Latex in adhesive tape. Please use paper tape   Erythromycin Diarrhea, Rash   Other reaction(s): GI Intolerance   Penicillins Nausea And Vomiting, Rash      Medication List       Accurate as of 09/27/16 10:32 AM. Always use your most recent med list.          ALPRAZolam 0.25 MG tablet Commonly known as:  XANAX Take 1 tablet (0.25 mg total) by mouth daily as needed. For anxiety   aspirin 81 MG tablet Take 81 mg by mouth daily.   atorvastatin 10 MG tablet Commonly known as:  LIPITOR Take 10 mg by mouth daily.   azelastine 0.1 % nasal spray Commonly known as:  ASTELIN 1-2 puffs each nostril once or twice daily as needed   DULoxetine 60 MG capsule Commonly known as:  CYMBALTA Take 1 capsule (60 mg total) by mouth daily.   fluticasone 50 MCG/ACT nasal spray Commonly known as:  FLONASE 1-2 puffs each nostril once or twice daily as neededs   gabapentin 100 MG capsule Commonly known as:  NEURONTIN Take 2 capsules (200 mg total) by mouth at bedtime.   ipratropium 0.06 % nasal spray Commonly known as:  ATROVENT 1-2 puffs each nostril once or twice daily as needed   NON FORMULARY CPAP- set on [employer].   NONFORMULARY OR COMPOUNDED ITEM Allergy Vaccine 1:10 Given at Home   OPTIMAL-D 10932 units capsule Generic drug:  Cholecalciferol TAKE 2 CAPSULES BY MOUTH ONCE A WEEK   traZODone 100 MG tablet Commonly known as:  DESYREL TAKE ONE-HALF TO ONE TABLET BY MOUTH ONCE DAILY AS NEEDED FOR SLEEP AS DIRECTED   VESICARE 10 MG tablet Generic drug:  solifenacin TAKE 1 TABLET BY MOUTH ONCE DAILY   XYZAL 5 MG  tablet Generic drug:  levocetirizine Take 5 mg by mouth every evening.     Known medication allergies: Allergies  Allergen Reactions  . Hydrocodone Nausea And Vomiting     nausea and vomiting and headaches  . Advil [Ibuprofen] Hives and Itching  . Amoxicillin Hives  . Cephalexin      Neuropathy  . Codeine     Crazy in the head, bp drops  . Levofloxacin Itching  . Lyrica [Pregabalin] Other (See Comments)    "Makes me like a zombie. I'm awake but I'm in slow motion."  . Meperidine Hcl     B/P drops  . Morphine And Related      States" extreme migraines"  . Oxycodone-Acetaminophen Other (See Comments)    Crazy in head, drop in bp  . Oxycodone-Acetaminophen Other (See Comments)  . Sulfonamide Derivatives Nausea And Vomiting    Stomach upset  . Adhesive [Tape] Rash    Latex in adhesive tape. Please use paper tape  . Erythromycin Diarrhea and Rash    Other reaction(s): GI Intolerance  . Penicillins Nausea And Vomiting and Rash     Physical examination: Blood pressure 128/70, pulse 72, temperature 98.4 F (36.9 C), resp. rate 19, height 4' 11.5" (1.511 m), weight 204 lb 3.2 oz (92.6 kg), SpO2 96 %.  General: Alert, interactive, in no acute distress. HEENT: PERRLA, TMs pearly gray, turbinates mildly edematous with clear discharge, post-pharynx non erythematous. Neck: Supple without lymphadenopathy. Lungs: Clear to auscultation without wheezing, rhonchi or rales. {no increased work of breathing. CV: Normal S1, S2 without murmurs. Abdomen: Nondistended, nontender. Skin: Warm and dry, without lesions or rashes. Extremities:  No clubbing, cyanosis or edema.  Neuro:   Grossly intact.  Diagnositics/Labs:  Spirometry: FEV1: 1.89L  102%, FVC: 2.23L  91%, ratio consistent with  Nonobstructive patern  Allergy testing: environmental allergy skin prick testing is negative.  Allergy testing results were read and interpreted by provider, documented by clinical staff.   Assessment  and plan:   Allergic rhinoconjunctivitis     - allergy testing today is negative.  Will obtain environmental allergen panel.   If blood testing is negative then you no longer have environmental allergies and would not need to continue on allergy shots.   YouMost likely  have non-allergic rhinitis if both skin and serum IgE  is negative.       - continue Xyzal 5mg  daily     - continue Flonase 2 sprays each nostril daily -- used for nasal congestion      - continue Astelin 2 sprays each nostril twice a day -- used for nasal drainage/post-nasal drip     - continue Ipratropium 2 sprays each nostril up to 3-4 times a day for nasal drip   History of asthma and bronchitis      -  Lung function testing is normal      -  Asymptomatic.  At this time no inhaler use is needed   Follow-up 1 year or sooner if needed  I appreciate the opportunity to take part in [name]'s care. Please do not hesitate to contact me with questions.  Sincerely,   Prudy Feeler, MD Allergy/Immunology [facility]

## 2016-09-27 NOTE — Patient Instructions (Addendum)
Allergic rhinoconjunctivitis     - allergy testing today is negative.  Will obtain environmental allergen panel.   If blood testing is negative then you no longer have environmental allergies and would not need to continue on allergy shots.   You may have non-allergic rhinitis symptoms if both skin and blood testing is negative.       - continue Xyzal 5mg  daily     - continue Flonase 2 sprays each nostril daily -- used for nasal congestion      - continue Astelin 2 sprays each nostril twice a day -- used for nasal drainage/post-nasal drip     - continue Ipratropium 2 sprays each nostril up to 3-4 times a day for nasal drip         History of asthma and bronchitis      -  Lung function testing is normal      -  Asymptomatic.  At this time no inhaler use is needed   Follow-up 1 year or sooner if needed

## 2016-09-29 LAB — IGE+ALLERGENS ZONE 2(30)
Bahia Grass IgE: <0.1 kU/L
Bermuda Grass IgE: <0.1 kU/L
Cat Dander IgE: <0.1 kU/L
Cladosporium Herbarum IgE: <0.1 kU/L
Cockroach, American IgE: <0.1 kU/L
D Farinae IgE: <0.1 kU/L
Dog Dander IgE: <0.1 kU/L
Hickory, White IgE: <0.1 kU/L
IgE (Immunoglobulin E), Serum: 20 IU/mL (ref 0–100)
Johnson Grass IgE: <0.1 kU/L
Maple/Box Elder IgE: <0.1 kU/L
Mucor Racemosus IgE: <0.1 kU/L
Oak, White IgE: <0.1 kU/L
Plantain, English IgE: <0.1 kU/L
Sweet gum IgE RAST Ql: <0.1 kU/L
Timothy Grass IgE: <0.1 kU/L

## 2016-09-30 NOTE — Progress Notes (Signed)
[name] [address], [place] [address] Phone: [phone] Subjective:    I'm seeing this patient by the request  of:  Mosie Lukes, MD   CC: Knee pain f/u  [id number]  [name] is a 71 y.o. female coming in with complaint of knee pain.  Seems to be mostly on the left side. Has right knee replacement. Patient was having worsening pain again. Some increasing instability.Right knee has had some difficulty with a pes anserine bursitis. Left knee has severe arthritis. Last injection was 09/04/2016. Patient states left knee is feeling better. Patient states that the right knee still seems to be hurting him. Describes the pain as a dull, throbbing aching pain. Still has some swelling. Seems to be worse after sitting a long amount time. Patient is wondering what all she can do. Has not been doing the exercises regularly. Patient is going to be retiring in October and hopefully will not have such as sedentary work and will be feeling better.      Past Medical History:  Diagnosis Date  . Abdominal pain 08/18/2014  . Allergic state 09/21/2010  . Allergy    rhinitis  . Anemia 02-01-12   iron absorption problem  . Arthritis 02-01-12   osteoarthritis, Back pain, spine surgeries ? retain hardware cervial and  lumbar.  . Asthma   . Back pain 01/08/2011  . Bronchitis, acute 10/31/2010  . Cancer ([place]) 02-01-12   squamous cell -face  . Depression with anxiety 01/08/2011  . Esophageal reflux   . Fatigue 06/11/2011  . Fibromyalgia   . Heart murmur   . Hyperparathyroidism ([place]) 08/18/2014  . Knee pain, right 09/20/2011  . Motion sickness 12/24/2011  . Muscle cramps 08/30/2016  . Nausea & vomiting 12/24/2011  . Obesity   . Osteopenia 08/05/2014  . Otitis media 09/20/2011  . Otitis media of both ears 11/16/2010  . Otitis media of left ear 09/19/2010  . Panic attacks   . Pelvic floor dysfunction 09/02/2016  . Sinusitis 06/11/2011  . Sinusitis 12/24/2011    . Sleep apnea, obstructive   . Varicose veins of lower limb 10/02/2011   Past Surgical History:  Procedure Laterality Date  . ABDOMINAL SURGERY  02-01-12   ABDOMINAL SURGERY  . APPENDECTOMY  1962  . BARIATRIC SURGERY    . BLADDER SUSPENSION     complicated by bowel nick/ clolostomy  . BLEPHAROPLASTY Bilateral   . CARPAL TUNNEL RELEASE  02-01-12   right  . CATARACT EXTRACTION Bilateral   . CERVICAL DISC SURGERY     C-7 in 2004, C-4 and C-5 in 2010  . colostomy bag     Confirm with patient. Listed under medical conditions on form dated 07/18/09.  [name] COLOSTOMY REVERSAL  02-01-12  . EYE SURGERY     b/l cataracts and b/l blepharplasty  . [place]  . GASTRIC BYPASS  2003   Patient also noted "gastric bypass resection - 2004"  . HERNIA REPAIR  2009   Two hernias and abdominal reconstruction  . ivc filter     prophyllactically- no hx DVT  . LUMBAR DISC SURGERY     L1-L3 all had surgery most recently in 2017  . LUMBAR DISC SURGERY     L3-L4, Dr. Trenton Gammon  . pannilectomy     Per medical history form dated 07/18/09.  [name] TOTAL KNEE ARTHROPLASTY  02/11/2012   Procedure: TOTAL KNEE ARTHROPLASTY;  Surgeon: Mauri Pole, MD;  Location: [facility];  Service: Orthopedics;  Laterality: Right;  . ULNAR NERVE REPAIR Left 07/18/2016   by Dr.Henry Pool  . VULVA SURGERY  02-01-12   cyst removal-pt 8 months pregnant   Social History   Social History  . Marital status: Married    Spouse name: N/A  . Number of children: N/A  . Years of education: N/A   Social History Main Topics  . Smoking status: Never Smoker  . Smokeless tobacco: Never Used  . Alcohol use No     Comment: special occasions  . Drug use: No  . Sexual activity: No   Other Topics Concern  . None   Social History Narrative  . None   Allergies  Allergen Reactions  . Hydrocodone Nausea And Vomiting     nausea and vomiting and headaches  . Advil [Ibuprofen] Hives and Itching  . Amoxicillin Hives  . Cephalexin       Neuropathy  . Codeine     Crazy in the head, bp drops  . Levofloxacin Itching  . Lyrica [Pregabalin] Other (See Comments)    "Makes me like a zombie. I'm awake but I'm in slow motion."  . Meperidine Hcl     B/P drops  . Morphine And Related      States" extreme migraines"  . Oxycodone-Acetaminophen Other (See Comments)    Crazy in head, drop in bp  . Oxycodone-Acetaminophen Other (See Comments)  . Sulfonamide Derivatives Nausea And Vomiting    Stomach upset  . Adhesive [Tape] Rash    Latex in adhesive tape. Please use paper tape  . Erythromycin Diarrhea and Rash    Other reaction(s): GI Intolerance  . Penicillins Nausea And Vomiting and Rash   Family History  Problem Relation Age of Onset  . Alcohol abuse Mother   . Other Mother        accidental med overdose  . Emphysema Mother        smoked  . Bipolar disorder Mother   . Other Father        CHF  . Diabetes Father   . Cancer Father        throat ca/ smoked  . Alcohol abuse Father   . Stroke Father   . Hypertension Brother   . Hyperlipidemia Brother   . Obesity Brother   . Heart attack Maternal Grandfather   . Heart attack Paternal Grandmother   . Heart attack Paternal Grandfather   . Arthritis Son        psoriatic  . Arthritis Son        psoriatic  . Obesity Son      Past medical history, social, surgical and family history all reviewed in electronic medical record.  No pertanent information unless stated regarding to the chief complaint.   Review of Systems: No headache, visual changes, nausea, vomiting, diarrhea, constipation, dizziness, abdominal pain, skin rash, fevers, chills, night sweats, weight loss, swollen lymph nodes, body aches, joint swelling, chest pain, shortness of breath, mood changes.  Positive muscle aches  Objective  Blood pressure 122/72, pulse (!) 53, height 4\' 11"  (1.499 m), weight 204 lb (92.5 kg), SpO2 93 %.   Systems examined below as of 10/01/16 General: NAD A&O x3 mood,  affect normal  HEENT: Pupils equal, extraocular movements intact no nystagmus Respiratory: not short of breath at rest or with speaking Cardiovascular: No lower extremity edema, non tender Skin: Warm dry intact with no signs of infection or rash on extremities or on  axial skeleton. Abdomen: Soft nontender, no masses Neuro: Cranial nerves  intact, neurovascularly intact in all extremities with 2+ DTRs and 2+ pulses. Lymph: No lymphadenopathy appreciated today  Gait normal with good balance and coordination.  MSK: Non tender with full range of motion and good stability and symmetric strength and tone of shoulders, elbows, wrist,  hips and ankles bilaterally.  Arthritic changes of multiple joints Right knee exam shows replacement. Still severely tender to palpation at the pes anserine area with mild swelling. Patient does have more pain with straining down the leg. Patient has near full flexion. Neurovascularly intact distally. No significant instability   Impression and Recommendations:     This case required medical decision making of moderate complexity.      Note: This dictation was prepared with Dragon dictation along with smaller phrase technology. Any transcriptional errors that result from this process are unintentional.

## 2016-10-01 ENCOUNTER — Encounter: Payer: Self-pay | Admitting: Family Medicine

## 2016-10-01 ENCOUNTER — Ambulatory Visit (INDEPENDENT_AMBULATORY_CARE_PROVIDER_SITE_OTHER): Payer: Medicare Other | Admitting: Family Medicine

## 2016-10-01 DIAGNOSIS — M7050 Other bursitis of knee, unspecified knee: Secondary | ICD-10-CM | POA: Diagnosis not present

## 2016-10-01 NOTE — Assessment & Plan Note (Signed)
Patient did not have a significant amount of improvement with the injection. Once continue conservative therapy. We will consider formal physical therapy. We discussed icing regimen. Topical anti-inflammatories, icing regimen. Patient will follow-up with me again in 4-6 weeks

## 2016-10-01 NOTE — Patient Instructions (Addendum)
[name]  Lets keep watching the knee.  [name].  Try the thigh compression sleeve.  pennsaid pinkie amount topically 2 times daily as needed.   See me when you need the injection.  Also call me if you want the physical therapy at [place].

## 2016-10-16 ENCOUNTER — Telehealth: Payer: Self-pay | Admitting: Family Medicine

## 2016-10-16 NOTE — Telephone Encounter (Signed)
Patient would like to switch PCP from Dr. Penni Homans of [place] to Dr. Briscoe Deutscher of [place].  Please respond at your earliest convenience to acknowledge the patient's request.   Ty,  -LL

## 2016-10-16 NOTE — Telephone Encounter (Signed)
Okay 

## 2016-10-16 NOTE — Telephone Encounter (Signed)
Ok with me 

## 2016-10-26 ENCOUNTER — Other Ambulatory Visit: Payer: Self-pay | Admitting: Family Medicine

## 2016-10-29 ENCOUNTER — Ambulatory Visit (INDEPENDENT_AMBULATORY_CARE_PROVIDER_SITE_OTHER): Payer: Medicare Other | Admitting: Family Medicine

## 2016-10-29 ENCOUNTER — Encounter: Payer: Self-pay | Admitting: Family Medicine

## 2016-10-29 VITALS — BP 120/78 | HR 53 | Temp 98.2°F | Ht 60.0 in | Wt 209.0 lb

## 2016-10-29 DIAGNOSIS — R252 Cramp and spasm: Secondary | ICD-10-CM | POA: Diagnosis not present

## 2016-10-29 DIAGNOSIS — G500 Trigeminal neuralgia: Secondary | ICD-10-CM | POA: Diagnosis not present

## 2016-10-29 MED ORDER — CARBAMAZEPINE ER 200 MG PO CP12: 200.0000 mg | ORAL_CAPSULE | Freq: Two times a day (BID) | ORAL | 0 refills | Status: DC

## 2016-10-29 NOTE — Patient Instructions (Addendum)
Stay well hydrated with water.  Stop taking atorvastatin until you follow up.  OK to use advil as needed.  OK to try Tylenol.   OK to try ice.

## 2016-10-29 NOTE — Progress Notes (Signed)
Pre visit review using our clinic review tool, if applicable. No additional management support is needed unless otherwise documented below in the visit note. 

## 2016-10-29 NOTE — Progress Notes (Signed)
Chief Complaint  Patient presents with  . Follow-up    congestion    [name] here for R sided sinus pain.  This has been going on for 2 mo. She saw Dr. Charlett Blake and underwent a 10 d course of Omnicef that was mildly helpful, but did not resolve her issue. She was placed on a Zpak by her dentist after being told it is not related to her teeth which did not help either. She describes the pain as a burning sensation. It is stinging and more intense around her eye. It affects in front of her ear, jaw, and face sparing the bottom portion of her jaw and temple region. No reports of injury. She has had sinus infections before and states that it does not feel like that. Advil has been helpful. She denies any vision changes, fevers, runny/stuffy nose, sore throat, cough, shortness of breath, swelling, or redness.  She is also complaining of muscle cramps that come and go throughout various portions of her body. She is on atorvastatin. She's never tried coming off of it. Her oral intake of water could be better. There is no injury or change in activity that preceded this. She has not tried anything at home for this.  ROS:  Const: Denies fevers HEENT: As noted in HPI Lungs: No SOB  Past Medical History:  Diagnosis Date  . Abdominal pain 08/18/2014  . Allergic state 09/21/2010  . Allergy    rhinitis  . Anemia 02-01-12   iron absorption problem  . Arthritis 02-01-12   osteoarthritis, Back pain, spine surgeries ? retain hardware cervial and  lumbar.  . Asthma   . Back pain 01/08/2011  . Bronchitis, acute 10/31/2010  . Cancer ([place]) 02-01-12   squamous cell -face  . Depression with anxiety 01/08/2011  . Esophageal reflux   . Fatigue 06/11/2011  . Fibromyalgia   . Heart murmur   . Hyperparathyroidism ([place]) 08/18/2014  . Knee pain, right 09/20/2011  . Motion sickness 12/24/2011  . Muscle cramps 08/30/2016  . Nausea & vomiting 12/24/2011  . Obesity   . Osteopenia 08/05/2014  . Otitis media 09/20/2011   . Otitis media of both ears 11/16/2010  . Otitis media of left ear 09/19/2010  . Panic attacks   . Pelvic floor dysfunction 09/02/2016  . Sinusitis 06/11/2011  . Sinusitis 12/24/2011  . Sleep apnea, obstructive   . Varicose veins of lower limb 10/02/2011   Family History  Problem Relation Age of Onset  . Alcohol abuse Mother   . Other Mother        accidental med overdose  . Emphysema Mother        smoked  . Bipolar disorder Mother   . Other Father        CHF  . Diabetes Father   . Cancer Father        throat ca/ smoked  . Alcohol abuse Father   . Stroke Father   . Hypertension Brother   . Hyperlipidemia Brother   . Obesity Brother   . Heart attack Maternal Grandfather   . Heart attack Paternal Grandmother   . Heart attack Paternal Grandfather   . Arthritis Son        psoriatic  . Arthritis Son        psoriatic  . Obesity Son     BP 120/78 (BP Location: Left Arm, Patient Position: Sitting, Cuff Size: Large)   Pulse (!) 53   Temp 98.2 F (36.8 C) (Oral)  Ht 5' (1.524 m)   Wt 209 lb (94.8 kg)   SpO2 98%   BMI 40.82 kg/m  General: Awake, alert, appears stated age HEENT: AT, [place], ears patent b/l and TM's neg, nares patent w/o discharge, pharynx pink and without exudates, MMM Neck: No masses or asymmetry Skin: No erythema or edema; +TTP over sinus area and R sided portion of face Heart: RRR Lungs: CTAB, no accessory muscle use Psych: Age appropriate judgment and insight, normal mood and affect  Trigeminal neuralgia - Plan: carbamazepine (CARBATROL) 200 MG 12 hr capsule  Muscle cramp  Does not sound allergic or infectious. Given burning, will tx for TN.  Push fluids, hold atorvastatin until f/u appt, will leave on list for now.  F/u in 3-4 weeks to recheck pain and cramps. If pain no better, will refer to ENT. Could try nerve block also. If cramps are better, will try to restart statin vs switch to rosuvastatin vs pravastatin pending insurance coverage.  Pt voiced  understanding and agreement to the plan.  [place], DO 10/29/16 1:11 PM

## 2016-11-22 ENCOUNTER — Ambulatory Visit (INDEPENDENT_AMBULATORY_CARE_PROVIDER_SITE_OTHER): Payer: Medicare Other | Admitting: Family Medicine

## 2016-11-22 ENCOUNTER — Encounter: Payer: Self-pay | Admitting: Family Medicine

## 2016-11-22 VITALS — BP 127/84 | HR 66 | Temp 99.0°F | Ht 60.0 in | Wt 209.5 lb

## 2016-11-22 DIAGNOSIS — N3281 Overactive bladder: Secondary | ICD-10-CM | POA: Diagnosis not present

## 2016-11-22 DIAGNOSIS — Z09 Encounter for follow-up examination after completed treatment for conditions other than malignant neoplasm: Secondary | ICD-10-CM

## 2016-11-22 DIAGNOSIS — I739 Peripheral vascular disease, unspecified: Principal | ICD-10-CM

## 2016-11-22 DIAGNOSIS — I779 Disorder of arteries and arterioles, unspecified: Secondary | ICD-10-CM | POA: Diagnosis not present

## 2016-11-22 MED ORDER — ROSUVASTATIN CALCIUM 20 MG PO TABS: 20.0000 mg | ORAL_TABLET | Freq: Every day | ORAL | 5 refills | Status: DC

## 2016-11-22 MED ORDER — MIRABEGRON ER 25 MG PO TB24: 25.0000 mg | ORAL_TABLET | Freq: Every day | ORAL | 2 refills | Status: DC

## 2016-11-22 NOTE — Progress Notes (Signed)
Pre visit review using our clinic review tool, if applicable. No additional management support is needed unless otherwise documented below in the visit note. 

## 2016-11-22 NOTE — Progress Notes (Signed)
Chief Complaint  Patient presents with  . Follow-up    facial pain and muscle cramps    Subjective: Patient is a 71 y.o. female here for f/u facial pain.  She was diagnosed with trigeminal neuralgia at her last visit.  She is placed on carbamazepine.  After 2-3 days being on the medicine, her pain completely resolved.  She has had no issues since then.  She also had muscle cramps that were attributed to her statin.  Within 1 week, her cramping completely resolved.  She does have a history of carotid artery disease and would like to be on something to help prevent complications with this, however does not want to go back on the atorvastatin.  Admittedly, her intake of water is not good.  She is on Vesicare for overactive bladder.  She is wondering if there is anything else can be done because she is still having issues.  Overactive bladder/leakage symptoms are still prevalent.  She is not having any pain or bleeding.   ROS: MSK: No muscle aches GU: as noted in HPI  Family History  Problem Relation Age of Onset  . Alcohol abuse Mother   . Other Mother        accidental med overdose  . Emphysema Mother        smoked  . Bipolar disorder Mother   . Other Father        CHF  . Diabetes Father   . Cancer Father        throat ca/ smoked  . Alcohol abuse Father   . Stroke Father   . Hypertension Brother   . Hyperlipidemia Brother   . Obesity Brother   . Heart attack Maternal Grandfather   . Heart attack Paternal Grandmother   . Heart attack Paternal Grandfather   . Arthritis Son        psoriatic  . Arthritis Son        psoriatic  . Obesity Son    Past Medical History:  Diagnosis Date  . Abdominal pain 08/18/2014  . Allergic state 09/21/2010  . Allergy    rhinitis  . Anemia 02-01-12   iron absorption problem  . Arthritis 02-01-12   osteoarthritis, Back pain, spine surgeries ? retain hardware cervial and  lumbar.  . Asthma   . Back pain 01/08/2011  . Bronchitis, acute  10/31/2010  . Cancer ([place]) 02-01-12   squamous cell -face  . Depression with anxiety 01/08/2011  . Esophageal reflux   . Fatigue 06/11/2011  . Fibromyalgia   . Heart murmur   . Hyperparathyroidism ([place]) 08/18/2014  . Knee pain, right 09/20/2011  . Motion sickness 12/24/2011  . Muscle cramps 08/30/2016  . Nausea & vomiting 12/24/2011  . Obesity   . Osteopenia 08/05/2014  . Otitis media 09/20/2011  . Otitis media of both ears 11/16/2010  . Otitis media of left ear 09/19/2010  . Panic attacks   . Pelvic floor dysfunction 09/02/2016  . Sinusitis 06/11/2011  . Sinusitis 12/24/2011  . Sleep apnea, obstructive   . Varicose veins of lower limb 10/02/2011   Allergies  Allergen Reactions  . Hydrocodone Nausea And Vomiting     nausea and vomiting and headaches  . Advil [Ibuprofen] Hives and Itching  . Amoxicillin Hives  . Cephalexin      Neuropathy  . Codeine     Crazy in the head, bp drops  . Levofloxacin Itching  . Lyrica [Pregabalin] Other (See Comments)    "Makes me like  a zombie. I'm awake but I'm in slow motion."  . Meperidine Hcl     B/P drops  . Morphine And Related      States" extreme migraines"  . Oxycodone-Acetaminophen Other (See Comments)    Crazy in head, drop in bp  . Oxycodone-Acetaminophen Other (See Comments)  . Sulfonamide Derivatives Nausea And Vomiting    Stomach upset  . Adhesive [Tape] Rash    Latex in adhesive tape. Please use paper tape  . Erythromycin Diarrhea and Rash    Other reaction(s): GI Intolerance  . Penicillins Nausea And Vomiting and Rash   Current Outpatient Prescriptions:  .  ALPRAZolam (XANAX) 0.25 MG tablet, Take 1 tablet (0.25 mg total) by mouth daily as needed. For anxiety, Disp: 30 tablet, Rfl: 3 .  aspirin 81 MG tablet, Take 81 mg by mouth daily., Disp: , Rfl:  .  DULoxetine (CYMBALTA) 60 MG capsule, Take 1 capsule (60 mg total) by mouth daily., Disp: 90 capsule, Rfl: 1 .  fluticasone (FLONASE) 50 MCG/ACT nasal spray, 1-2 puffs each nostril  once or twice daily as neededs, Disp: 16 g, Rfl: 12 .  gabapentin (NEURONTIN) 100 MG capsule, Take 2 capsules (200 mg total) by mouth at bedtime., Disp: 60 capsule, Rfl: 3 .  levocetirizine (XYZAL) 5 MG tablet, Take 5 mg by mouth every evening., Disp: , Rfl:  .  NON FORMULARY, CPAP- set on [employer]., Disp: , Rfl:  .  OPTIMAL-D 34196 units capsule, TAKE 2 CAPSULES BY MOUTH ONCE A WEEK, Disp: 8 capsule, Rfl: 4 .  traZODone (DESYREL) 100 MG tablet, TAKE 1/2 TO 1 (ONE-HALF TO ONE) TABLET BY MOUTH ONCE DAILY AS NEEDED FOR SLEEP AS DIRECTED, Disp: 90 tablet, Rfl: 0 .  VESICARE 10 MG tablet, TAKE 1 TABLET BY MOUTH ONCE DAILY, Disp: 30 tablet, Rfl: 3 .  carbamazepine (CARBATROL) 200 MG 12 hr capsule, Take 1 capsule (200 mg total) by mouth 2 (two) times daily., Disp: 28 capsule, Rfl: 0  Objective: BP 127/84 (BP Location: Left Arm, Patient Position: Sitting, Cuff Size: Large)   Pulse 66   Temp 99 F (37.2 C) (Oral)   Ht 5' (1.524 m)   Wt 209 lb 8 oz (95 kg)   SpO2 98%   BMI 40.92 kg/m  General: Awake, appears stated age Lungs: No accessory muscle use Psych: Age appropriate judgment and insight, normal affect and mood  Assessment and Plan: Bilateral carotid artery disease, unspecified type (HCC) - Plan: rosuvastatin (CRESTOR) 20 MG tablet  OAB (overactive bladder) - Plan: mirabegron ER (MYRBETRIQ) 25 MG TB24 tablet  Follow-up for resolved condition  Orders as above.  Change Lipitor to Crestor. Add Myrbetriq. Appreciate that her pain improved for both likely trigeminal neuralgia and statin induced myalgia. F/u in 1 mo to recheck how things are going with new med.  The patient voiced understanding and agreement to the plan.  [place], DO 11/22/16  12:25 PM

## 2016-11-22 NOTE — Patient Instructions (Signed)
Stay very well hydrated.  If anything is too expensive, don't fill it and let me know.  Trigeminal neuralgia is what I think you had last time.

## 2016-11-23 ENCOUNTER — Other Ambulatory Visit: Payer: Self-pay | Admitting: Family Medicine

## 2016-12-04 ENCOUNTER — Ambulatory Visit: Payer: Medicare Other | Admitting: Family Medicine

## 2016-12-11 DIAGNOSIS — Z23 Encounter for immunization: Secondary | ICD-10-CM | POA: Diagnosis not present

## 2016-12-11 DIAGNOSIS — L820 Inflamed seborrheic keratosis: Secondary | ICD-10-CM | POA: Diagnosis not present

## 2016-12-11 DIAGNOSIS — L570 Actinic keratosis: Secondary | ICD-10-CM | POA: Diagnosis not present

## 2016-12-15 ENCOUNTER — Telehealth: Payer: Self-pay | Admitting: Family Medicine

## 2016-12-21 NOTE — Telephone Encounter (Signed)
Pt is completely out of meds. Can this be sent today? Please call patient back [phone]

## 2016-12-21 NOTE — Telephone Encounter (Signed)
Rx has been sent  

## 2016-12-25 ENCOUNTER — Other Ambulatory Visit: Payer: Self-pay | Admitting: Family Medicine

## 2016-12-25 DIAGNOSIS — G500 Trigeminal neuralgia: Secondary | ICD-10-CM

## 2016-12-25 MED ORDER — CARBAMAZEPINE ER 200 MG PO CP12: 200.0000 mg | ORAL_CAPSULE | Freq: Two times a day (BID) | ORAL | 1 refills | Status: DC

## 2016-12-26 ENCOUNTER — Ambulatory Visit (INDEPENDENT_AMBULATORY_CARE_PROVIDER_SITE_OTHER): Payer: Medicare Other | Admitting: Family Medicine

## 2016-12-26 ENCOUNTER — Other Ambulatory Visit: Payer: Self-pay | Admitting: Family Medicine

## 2016-12-26 ENCOUNTER — Encounter: Payer: Self-pay | Admitting: Family Medicine

## 2016-12-26 VITALS — BP 118/78 | HR 58 | Temp 98.6°F | Ht 59.0 in | Wt 209.5 lb

## 2016-12-26 DIAGNOSIS — N3281 Overactive bladder: Secondary | ICD-10-CM

## 2016-12-26 DIAGNOSIS — Z23 Encounter for immunization: Secondary | ICD-10-CM | POA: Diagnosis not present

## 2016-12-26 DIAGNOSIS — S46811A Strain of other muscles, fascia and tendons at shoulder and upper arm level, right arm, initial encounter: Secondary | ICD-10-CM

## 2016-12-26 MED ORDER — GABAPENTIN 100 MG PO CAPS: 200.0000 mg | ORAL_CAPSULE | Freq: Every day | ORAL | 1 refills | Status: DC

## 2016-12-26 MED ORDER — TRAMADOL HCL 50 MG PO TABS: 50.0000 mg | ORAL_TABLET | Freq: Three times a day (TID) | ORAL | 0 refills | Status: DC | PRN

## 2016-12-26 MED ORDER — HYDROXYZINE HCL 10 MG PO TABS: 10.0000 mg | ORAL_TABLET | Freq: Three times a day (TID) | ORAL | 1 refills | Status: AC | PRN

## 2016-12-26 MED ORDER — SOLIFENACIN SUCCINATE 10 MG PO TABS: 10.0000 mg | ORAL_TABLET | Freq: Every day | ORAL | 1 refills | Status: DC

## 2016-12-26 NOTE — Progress Notes (Signed)
Musculoskeletal Exam  [name] DOB: Jun 14, 1945  DOS: 12/26/2016  SUBJECTIVE:  Chief Complaint:   Chief Complaint  Patient presents with  . Follow-up   [name] is a 71 y.o.  female for evaluation and treatment of R shoulder pain.   Onset:  10 days ago. She was lifting a 22 lb [place] out of oven.  Location: R trap/shoulder area Character:  aching, sharp and throbbing  Progression of issue:  is unchanged Associated symptoms: some decreased ROM 2/2 pain Treatment: to date has been heat, tramadol.   Neurovascular symptoms: no  Hx of OAB, was on Vesicare. I tried to change her to [facility], but it gave her hives and did not work. Pt is requesting referral to urology.  ROS: Musculoskeletal/Extremities: +R shoulder pain Neurologic: no numbness, tingling no weakness   Past Medical History:  Diagnosis Date  . Abdominal pain 08/18/2014  . Allergic state 09/21/2010  . Allergy    rhinitis  . Anemia 02-01-12   iron absorption problem  . Arthritis 02-01-12   osteoarthritis, Back pain, spine surgeries ? retain hardware cervial and  lumbar.  . Asthma   . Back pain 01/08/2011  . Bronchitis, acute 10/31/2010  . Cancer ([place]) 02-01-12   squamous cell -face  . Depression with anxiety 01/08/2011  . Esophageal reflux   . Fatigue 06/11/2011  . Fibromyalgia   . Heart murmur   . Hyperparathyroidism ([place]) 08/18/2014  . Knee pain, right 09/20/2011  . Motion sickness 12/24/2011  . Muscle cramps 08/30/2016  . Nausea & vomiting 12/24/2011  . Obesity   . Osteopenia 08/05/2014  . Otitis media 09/20/2011  . Otitis media of both ears 11/16/2010  . Otitis media of left ear 09/19/2010  . Panic attacks   . Pelvic floor dysfunction 09/02/2016  . Sinusitis 06/11/2011  . Sinusitis 12/24/2011  . Sleep apnea, obstructive   . Varicose veins of lower limb 10/02/2011   Past Surgical History:  Procedure Laterality Date  . ABDOMINAL SURGERY  02-01-12   ABDOMINAL SURGERY  . APPENDECTOMY  1962  .  BARIATRIC SURGERY    . BLADDER SUSPENSION     complicated by bowel nick/ clolostomy  . BLEPHAROPLASTY Bilateral   . CARPAL TUNNEL RELEASE  02-01-12   right  . CATARACT EXTRACTION Bilateral   . CERVICAL DISC SURGERY     C-7 in 2004, C-4 and C-5 in 2010  . colostomy bag     Confirm with patient. Listed under medical conditions on form dated 07/18/09.  [name] COLOSTOMY REVERSAL  02-01-12  . EYE SURGERY     b/l cataracts and b/l blepharplasty  . [place]  . GASTRIC BYPASS  2003   Patient also noted "gastric bypass resection - 2004"  . HERNIA REPAIR  2009   Two hernias and abdominal reconstruction  . ivc filter     prophyllactically- no hx DVT  . LUMBAR DISC SURGERY     L1-L3 all had surgery most recently in 2017  . LUMBAR DISC SURGERY     L3-L4, Dr. Trenton Gammon  . pannilectomy     Per medical history form dated 07/18/09.  [name] TOTAL KNEE ARTHROPLASTY  02/11/2012   Procedure: TOTAL KNEE ARTHROPLASTY;  Surgeon: Mauri Pole, MD;  Location: [facility];  Service: Orthopedics;  Laterality: Right;  . ULNAR NERVE REPAIR Left 07/18/2016   by Dr.Henry Pool  . VULVA SURGERY  02-01-12   cyst removal-pt 8 months pregnant   Family History  Problem  Relation Age of Onset  . Alcohol abuse Mother   . Other Mother        accidental med overdose  . Emphysema Mother        smoked  . Bipolar disorder Mother   . Other Father        CHF  . Diabetes Father   . Cancer Father        throat ca/ smoked  . Alcohol abuse Father   . Stroke Father   . Hypertension Brother   . Hyperlipidemia Brother   . Obesity Brother   . Heart attack Maternal Grandfather   . Heart attack Paternal Grandmother   . Heart attack Paternal Grandfather   . Arthritis Son        psoriatic  . Arthritis Son        psoriatic  . Obesity Son    Current Outpatient Medications  Medication Sig Dispense Refill  . ALPRAZolam (XANAX) 0.25 MG tablet Take 1 tablet (0.25 mg total) by mouth daily as needed. For anxiety 30 tablet 3   . aspirin 81 MG tablet Take 81 mg by mouth daily.    . carbamazepine (CARBATROL) 200 MG 12 hr capsule Take 1 capsule (200 mg total) by mouth 2 (two) times daily for 14 days. 60 capsule 1  . carbamazepine (TEGRETOL XR) 200 MG 12 hr tablet Take 1 tablet (200 mg total) by mouth 2 (two) times daily. 60 tablet 1  . DULoxetine (CYMBALTA) 60 MG capsule Take 1 capsule (60 mg total) by mouth daily. 90 capsule 1  . fluticasone (FLONASE) 50 MCG/ACT nasal spray 1-2 puffs each nostril once or twice daily as neededs 16 g 12  . gabapentin (NEURONTIN) 100 MG capsule Take 2 capsules (200 mg total) by mouth at bedtime. 60 capsule 3  . levocetirizine (XYZAL) 5 MG tablet Take 5 mg by mouth every evening.    . NON FORMULARY CPAP- set on 12 [employer].    . OPTIMAL-D 24235 units capsule TAKE 2 CAPSULES BY MOUTH ONCE A WEEK 8 capsule 4  . rosuvastatin (CRESTOR) 20 MG tablet Take 1 tablet (20 mg total) by mouth daily. 30 tablet 5  . traZODone (DESYREL) 100 MG tablet TAKE 1/2 TO 1 (ONE-HALF TO ONE) TABLET BY MOUTH ONCE DAILY AS NEEDED FOR SLEEP AS DIRECTED 90 tablet 0  . VESICARE 10 MG tablet TAKE 1 TABLET BY MOUTH ONCE DAILY 30 tablet 3   Allergies  Allergen Reactions  . Hydrocodone Nausea And Vomiting     nausea and vomiting and headaches  . Myrbetriq [Mirabegron] Hives    Hives and itching  . Advil [Ibuprofen] Hives and Itching  . Amoxicillin Hives  . Cephalexin      Neuropathy  . Codeine     Crazy in the head, bp drops  . Levofloxacin Itching  . Lyrica [Pregabalin] Other (See Comments)    "Makes me like a zombie. I'm awake but I'm in slow motion."  . Meperidine Hcl     B/P drops  . Morphine And Related      States" extreme migraines"  . Oxycodone-Acetaminophen Other (See Comments)    Crazy in head, drop in bp  . Oxycodone-Acetaminophen Other (See Comments)  . Sulfonamide Derivatives Nausea And Vomiting    Stomach upset  . Adhesive [Tape] Rash    Latex in adhesive tape. Please use paper tape  .  Erythromycin Diarrhea and Rash    Other reaction(s): GI Intolerance  . Penicillins Nausea And Vomiting and  Rash   Social History   Socioeconomic History  . Marital status: Married  Tobacco Use  . Smoking status: Never Smoker  . Smokeless tobacco: Never Used  Substance and Sexual Activity  . Alcohol use: No    Alcohol/week: 0.0 oz    Comment: special occasions  . Drug use: No  . Sexual activity: No    Objective: VITAL SIGNS: BP 118/78 (BP Location: Left Arm, Patient Position: Sitting, Cuff Size: Large)   Pulse (!) 58   Temp 98.6 F (37 C) (Oral)   Ht 4\' 11"  (1.499 m)   Wt 209 lb 8 oz (95 kg)   SpO2 95%   BMI 42.31 kg/m  Constitutional: Well formed, well developed. No acute distress. Cardiovascular: Brisk cap refill Thorax & Lungs: No accessory muscle use Extremities: No clubbing. No cyanosis. No edema.  Skin: Warm. Dry. No erythema. No rash.  Musculoskeletal: R shoulder.   Normal active range of motion: yes.   Normal passive range of motion: yes Tenderness to palpation: yes, over trapezius on R Deformity: no Ecchymosis: no Tests positive: none Tests negative: Cross over, Neer's, Empty can, Speed's Neurologic: Normal sensory function. No focal deficits noted. DTR's equal and symmetry in UE's. No clonus. Psychiatric: Normal mood. Age appropriate judgment and insight. Alert & oriented x 3.    Assessment:  Trapezius strain, right, initial encounter - Plan: traMADol (ULTRAM) 50 MG tablet  Overactive bladder - Plan: solifenacin (VESICARE) 10 MG tablet  Need for shingles vaccine - Plan: Varicella-zoster vaccine IM (Shingrix)  Plan: Cont Tylenol and prn tramadol. Heat, ice, stretches/exercises. Call in 4 weeks if no better, will set up PT. Refill Vesicare, will D/C Myrbetriq. Refer to urology per pt request. Fu in 6 mo for med check. The patient voiced understanding and agreement to the plan.   [place], DO 12/26/16  12:57 PM

## 2016-12-26 NOTE — Progress Notes (Signed)
Pre visit review using our clinic review tool, if applicable. No additional management support is needed unless otherwise documented below in the visit note. 

## 2016-12-26 NOTE — Patient Instructions (Addendum)
Ice/cold pack over area for 10-15 min every 2-3 hours while awake.  Heat (pad or rice pillow in microwave) over affected area, 10-15 minutes every 2-3 hours while awake.   OK to take Tylenol 1000 mg (2 extra strength tabs) or 975 mg (3 regular strength tabs) every 6 hours as needed.  Send me a MyChart message in 3-4 weeks if you are not better, sooner if things are not going in a direction you are pleased with.   Trapezius stretches/exercises Do exercises exactly as told by your health care provider and adjust them as directed. It is normal to feel mild stretching, pulling, tightness, or discomfort as you do these exercises, but you should stop right away if you feel sudden pain or your pain gets worse.  Stretching and range of motion exercises These exercises warm up your muscles and joints and improve the movement and flexibility of your shoulder. These exercises can also help to relieve pain, numbness, and tingling. If you are unable to do any of the following for any reason, do not further attempt to do it.   Exercise A: Flexion, standing    1. Stand and hold a broomstick, a cane, or a similar object. Place your hands a little more than shoulder-width apart on the object. Your left / right hand should be palm-up, and your other hand should be palm-down. 2. Push the stick to raise your left / right arm out to your side and then over your head. Use your other hand to help move the stick. Stop when you feel a stretch in your shoulder, or when you reach the angle that is recommended by your health care provider. ? Avoid shrugging your shoulder while you raise your arm. Keep your shoulder blade tucked down toward your spine. 3. Hold for 30 seconds. 4. Slowly return to the starting position. Repeat 2 times. Complete this exercise 3 times per week.  Exercise B: Abduction, supine    1. Lie on your back and hold a broomstick, a cane, or a similar object. Place your hands a little more than  shoulder-width apart on the object. Your left / right hand should be palm-up, and your other hand should be palm-down. 2. Push the stick to raise your left / right arm out to your side and then over your head. Use your other hand to help move the stick. Stop when you feel a stretch in your shoulder, or when you reach the angle that is recommended by your health care provider. ? Avoid shrugging your shoulder while you raise your arm. Keep your shoulder blade tucked down toward your spine. 3. Hold for 30 seconds. 4. Slowly return to the starting position. Repeat 2 times. Complete this exercise 3 times per week.  Exercise C: Flexion, active-assisted    1. Lie on your back. You may bend your knees for comfort. 2. Hold a broomstick, a cane, or a similar object. Place your hands about shoulder-width apart on the object. Your palms should face toward your feet. 3. Raise the stick and move your arms over your head and behind your head, toward the floor. Use your healthy arm to help your left / right arm move farther. Stop when you feel a gentle stretch in your shoulder, or when you reach the angle where your health care provider tells you to stop. 4. Hold for 30 seconds. 5. Slowly return to the starting position. Repeat 2 times. Complete this exercise 3 times per week.  Exercise D: External  rotation and abduction    1. Stand in a door frame with one of your feet slightly in front of the other. This is called a staggered stance. 2. Choose one of the following positions as told by your health care provider: ? Place your hands and forearms on the door frame above your head. ? Place your hands and forearms on the door frame at the height of your head. ? Place your hands on the door frame at the height of your elbows. 3. Slowly move your weight onto your front foot until you feel a stretch across your chest and in the front of your shoulders. Keep your head and chest upright and keep your abdominal  muscles tight. 4. Hold for 30 seconds. 5. To release the stretch, shift your weight to your back foot. Repeat 2 times. Complete this stretch 3 times per week.  Strengthening exercises These exercises build strength and endurance in your shoulder. Endurance is the ability to use your muscles for a long time, even after your muscles get tired. Exercise E: Scapular depression and adduction  1. Sit on a stable chair. Support your arms in front of you with pillows, armrests, or a tabletop. Keep your elbows in line with the sides of your body. 2. Gently move your shoulder blades down toward your middle back. Relax the muscles on the tops of your shoulders and in the back of your neck. 3. Hold for 3 seconds. 4. Slowly release the tension and relax your muscles completely before doing this exercise again. Repeat for a total of 10 repetitions. 5. After you have practiced this exercise, try doing the exercise without the arm support. Then, try the exercise while standing instead of sitting. Repeat 2 times. Complete this exercise 3 times per week.  Exercise F: Shoulder abduction, isometric    1. Stand or sit about 4-6 inches (10-15 cm) from a wall with your left / right side facing the wall. 2. Bend your left / right elbow and gently press your elbow against the wall. 3. Increase the pressure slowly until you are pressing as hard as you can without shrugging your shoulder. 4. Hold for 3 seconds. 5. Slowly release the tension and relax your muscles completely. Repeat for a total of 10 repetitions. Repeat 2 times. Complete this exercise 3 times per week.  Exercise G: Shoulder flexion, isometric    1. Stand or sit about 4-6 inches (10-15 cm) away from a wall with your left / right side facing the wall. 2. Keep your left / right elbow straight and gently press the top of your fist against the wall. Increase the pressure slowly until you are pressing as hard as you can without shrugging your  shoulder. 3. Hold for 10-15 seconds. 4. Slowly release the tension and relax your muscles completely. Repeat for a total of 10 repetitions. Repeat 2 times. Complete this exercise 3 times per week.  Exercise H: Internal rotation    1. Sit in a stable chair without armrests, or stand. Secure an exercise band at your left / right side, at elbow height. 2. Place a soft object, such as a folded towel or a small pillow, under your left / right upper arm so your elbow is a few inches (about 8 cm) away from your side. 3. Hold the end of the exercise band so the band stretches. 4. Keeping your elbow pressed against the soft object under your arm, move your forearm across your body toward your abdomen. Keep  your body steady so the movement is only coming from your shoulder. 5. Hold for 3 seconds. 6. Slowly return to the starting position. Repeat for a total of 10 repetitions. Repeat 2 times. Complete this exercise 3 times per week.  Exercise I: External rotation    1. Sit in a stable chair without armrests, or stand. 2. Secure an exercise band at your left / right side, at elbow height. 3. Place a soft object, such as a folded towel or a small pillow, under your left / right upper arm so your elbow is a few inches (about 8 cm) away from your side. 4. Hold the end of the exercise band so the band stretches. 5. Keeping your elbow pressed against the soft object under your arm, move your forearm out, away from your abdomen. Keep your body steady so the movement is only coming from your shoulder. 6. Hold for 3 seconds. 7. Slowly return to the starting position. Repeat for a total of 10 repetitions. Repeat 2 times. Complete this exercise 3 times per week. Exercise J: Shoulder extension  1. Sit in a stable chair without armrests, or stand. Secure an exercise band to a stable object in front of you so the band is at shoulder height. 2. Hold one end of the exercise band in each hand. Your palms should  face each other. 3. Straighten your elbows and lift your hands up to shoulder height. 4. Step back, away from the secured end of the exercise band, until the band stretches. 5. Squeeze your shoulder blades together and pull your hands down to the sides of your thighs. Stop when your hands are straight down by your sides. Do not let your hands go behind your body. 6. Hold for 3 seconds. 7. Slowly return to the starting position. Repeat for a total of 10 repetitions. Repeat 2 times. Complete this exercise 3 times per week.  Exercise K: Shoulder extension, prone    1. Lie on your abdomen on a firm surface so your left / right arm hangs over the edge. 2. Hold a 5 lb weight in your hand so your palm faces in toward your body. Your arm should be straight. 3. Squeeze your shoulder blade down toward the middle of your back. 4. Slowly raise your arm behind you, up to the height of the surface that you are lying on. Keep your arm straight. 5. Hold for 3 seconds. 6. Slowly return to the starting position and relax your muscles. Repeat for a total of 10 repetitions. Repeat 2 times. Complete this exercise 3 times per week.   Exercise L: Horizontal abduction, prone  1. Lie on your abdomen on a firm surface so your left / right arm hangs over the edge. 2. Hold a 5 lb weight in your hand so your palm faces toward your feet. Your arm should be straight. 3. Squeeze your shoulder blade down toward the middle of your back. 4. Bend your elbow so your hand moves up, until your elbow is bent to an "L" shape (90 degrees). With your elbow bent, slowly move your forearm forward and up. Raise your hand up to the height of the surface that you are lying on. ? Your upper arm should not move, and your elbow should stay bent. ? At the top of the movement, your palm should face the floor. 5. Hold for 3 seconds. 6. Slowly return to the starting position and relax your muscles. Repeat for a total of 10  repetitions. Repeat 2 times. Complete this exercise 3 times per week.  Exercise M: Horizontal abduction, standing  1. Sit on a stable chair, or stand. 2. Secure an exercise band to a stable object in front of you so the band is at shoulder height. 3. Hold one end of the exercise band in each hand. 4. Straighten your elbows and lift your hands straight in front of you, up to shoulder height. Your palms should face down, toward the floor. 5. Step back, away from the secured end of the exercise band, until the band stretches. 6. Move your arms out to your sides, and keep your arms straight. 7. Hold for 3 seconds. 8. Slowly return to the starting position. Repeat for a total of 10 repetitions. Repeat 2 times. Complete this exercise 3 times per week.  Exercise N: Scapular retraction and elevation  1. Sit on a stable chair, or stand. 2. Secure an exercise band to a stable object in front of you so the band is at shoulder height. 3. Hold one end of the exercise band in each hand. Your palms should face each other. 4. Sit in a stable chair without armrests, or stand. 5. Step back, away from the secured end of the exercise band, until the band stretches. 6. Squeeze your shoulder blades together and lift your hands over your head. Keep your elbows straight. 7. Hold for 3 seconds. 8. Slowly return to the starting position. Repeat for a total of 10 repetitions. Repeat 2 times. Complete this exercise 3 times per week.  This information is not intended to replace advice given to you by your health care provider. Make sure you discuss any questions you have with your health care provider. Document Released: 01/08/2005 Document Revised: 09/15/2015 Document Reviewed: 11/25/2014 Elsevier Interactive Patient Education  2017 [employer].

## 2017-01-30 IMAGING — CT CT ABD-PELV W/ CM
2 of 5 series · 16 of 46 positions shown, 18 images · IV contrast (APPLIED)
Comparison: July 04, 2009

CLINICAL DATA: Abdominal pain with nausea and vomiting for 1 week.
Tender area mid abdominal wall anteriorly.

EXAM:
CT ABDOMEN AND PELVIS WITH CONTRAST
TECHNIQUE: Multidetector CT imaging of the abdomen and pelvis was performed
using the standard protocol following bolus administration of
intravenous contrast. Oral contrast also administered.
CONTRAST:  100mL OMNIPAQUE IOHEXOL 300 MG/ML  SOLN

[Series 2: abd/pelvis 5.0 b31f · axial · 0.92mm/px · z∈[+648,+1012]mm · 13 of 83 slices shown, 15 images]
[im 5/83  soft-tissue]
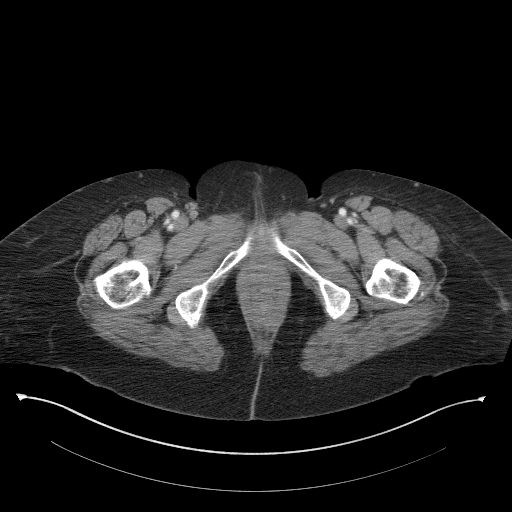
[im 5/83  bone]
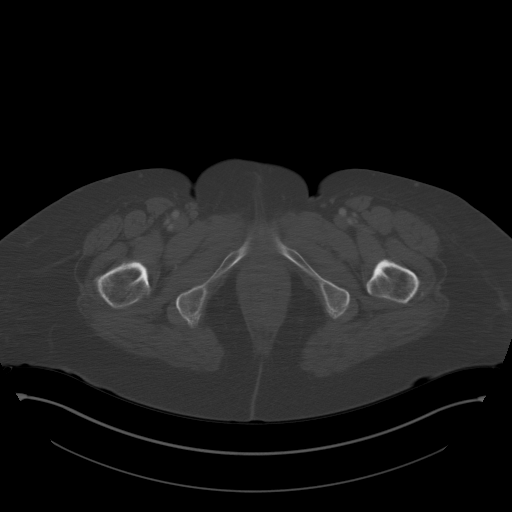
[im 13/83  soft-tissue]
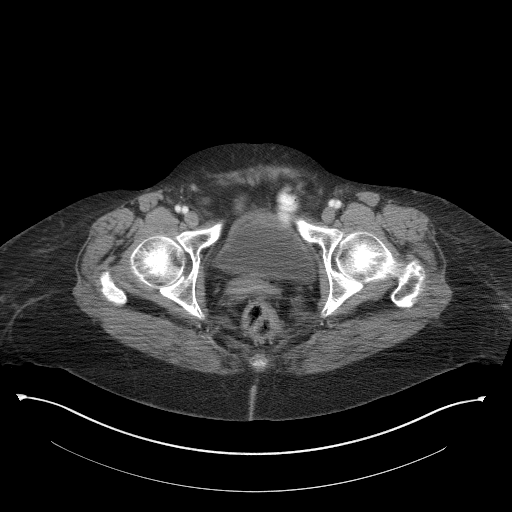
[im 18/83  soft-tissue]
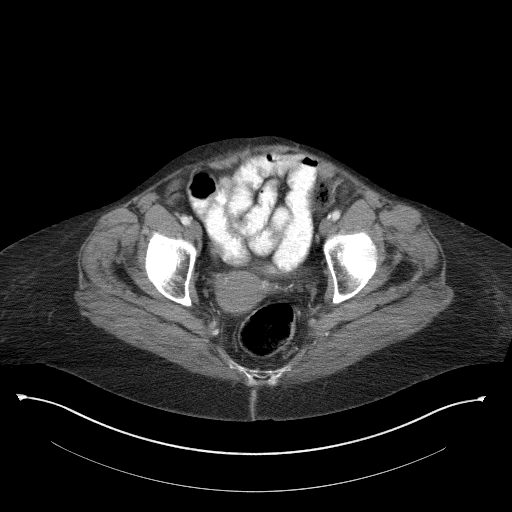
[im 22/83  soft-tissue]
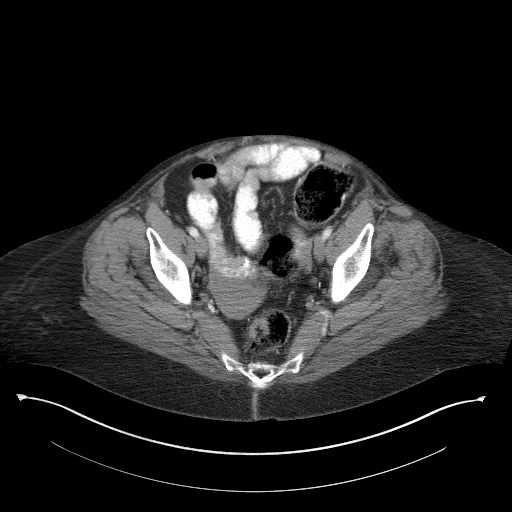
[im 31/83  soft-tissue]
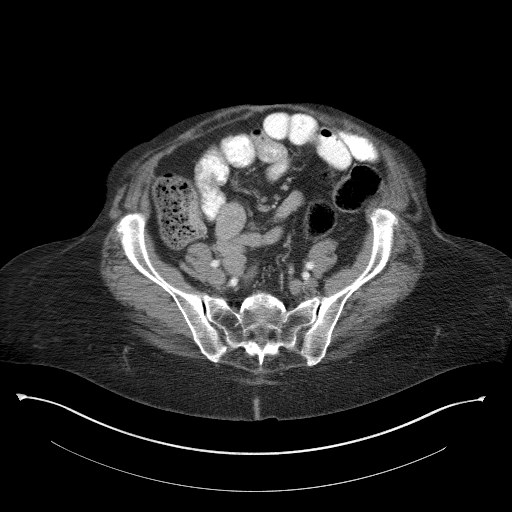
[im 35/83  soft-tissue]
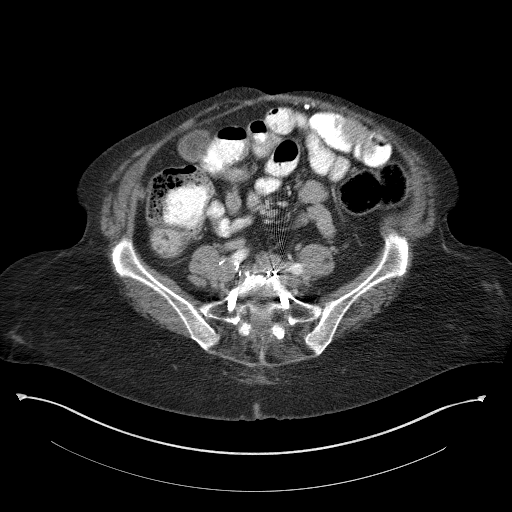
[im 44/83  soft-tissue]
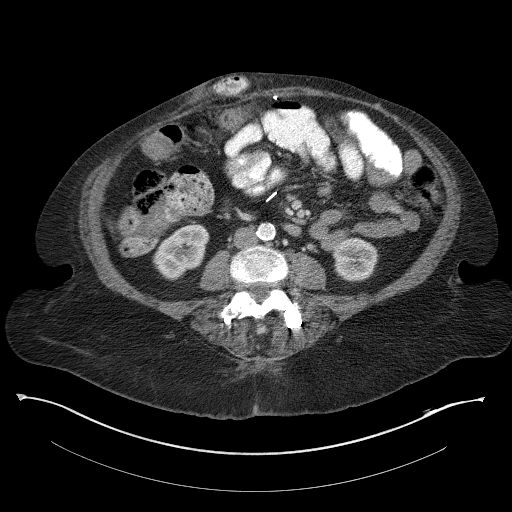
[im 48/83  soft-tissue]
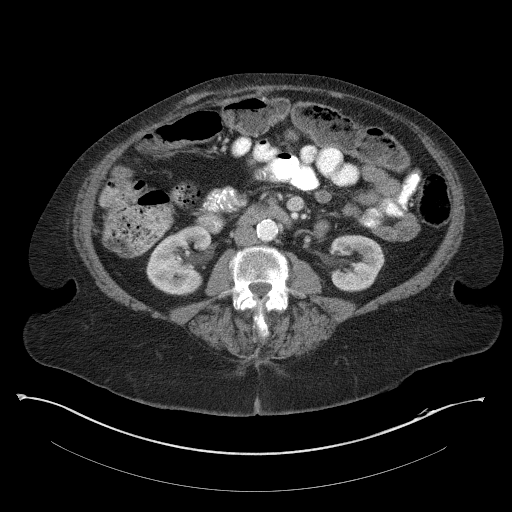
[im 52/83  soft-tissue]
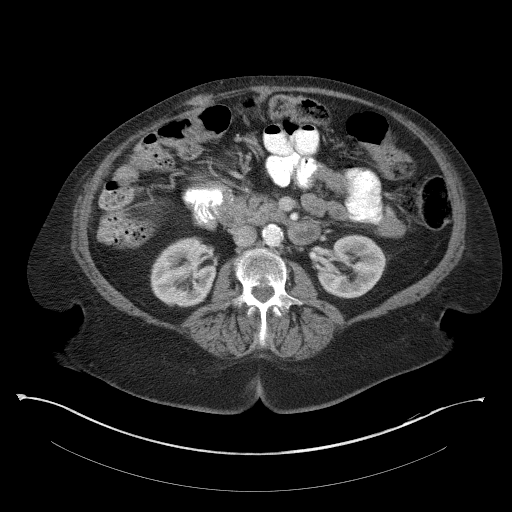
[im 52/83  bone]
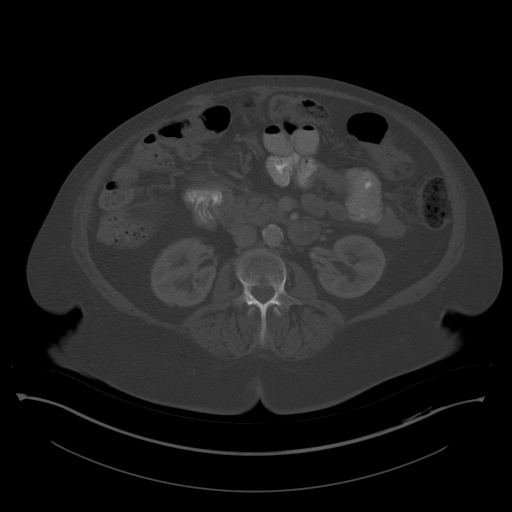
[im 61/83  soft-tissue]
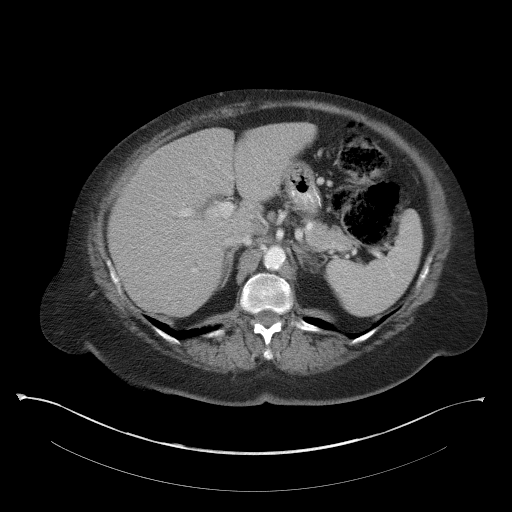
[im 65/83  soft-tissue]
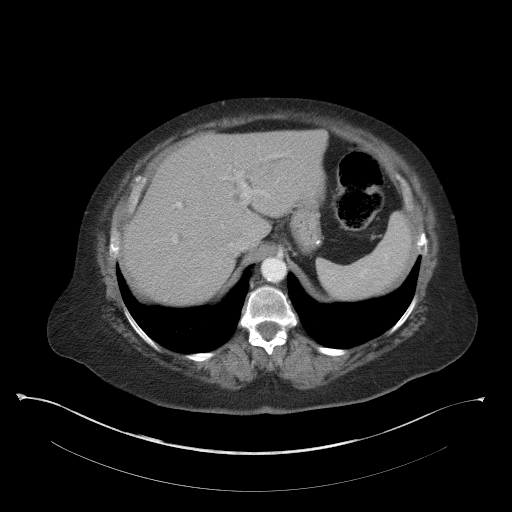
[im 70/83  soft-tissue]
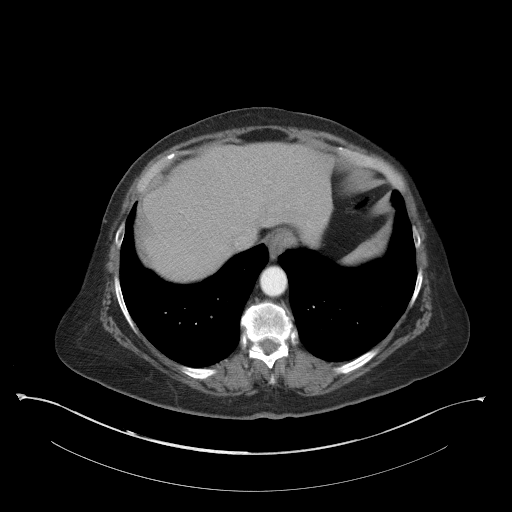
[im 78/83  soft-tissue]
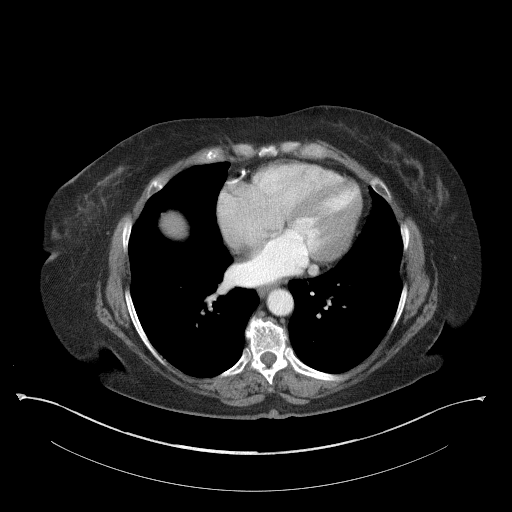

[Series 5: abd/pelvis 3.0 coronal · coronal · 0.83mm/px · 3 of 106 slices shown]
[im 36/106  soft-tissue]
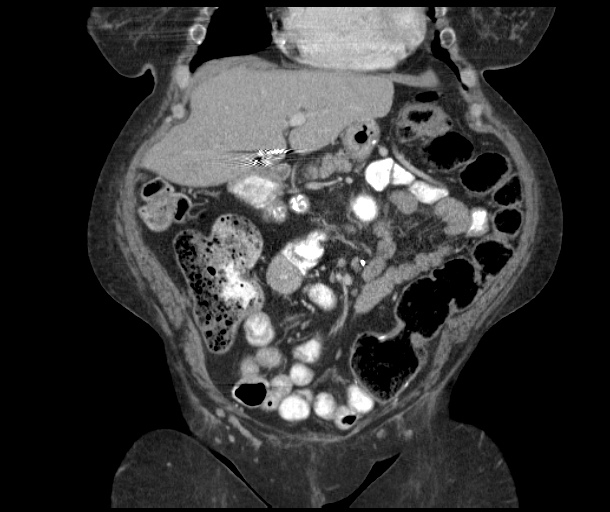
[im 47/106  soft-tissue]
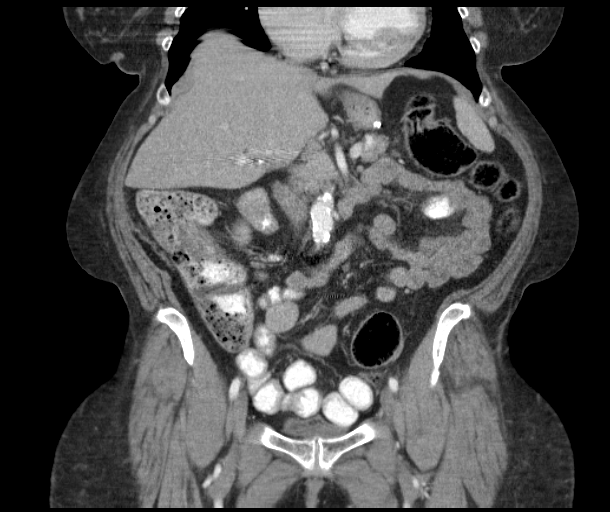
[im 59/106  soft-tissue]
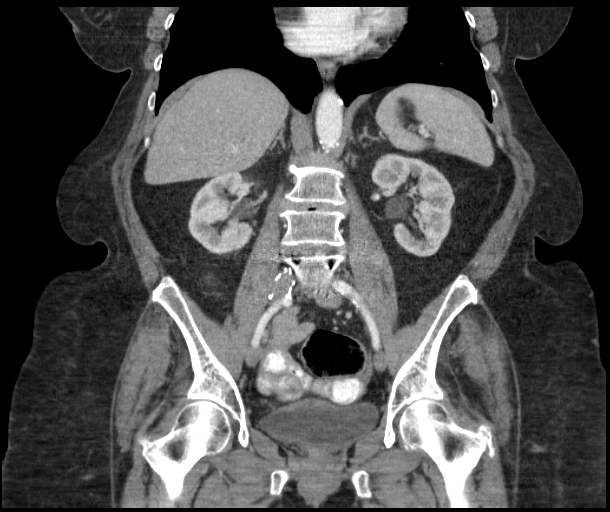

[16 of 46 positions shown; findings below may reference images not displayed]

FINDINGS: Lung bases are clear. There are multiple foci of coronary artery
calcification.

No focal liver lesions are identified. Gallbladder is absent. Common
bile duct is prominent at 13 mm. No mass or calculus is seen in the
biliary ductal system by CT.

Spleen, pancreas, and adrenals appear normal.

Patient has had gastric bypass surgery. There is no inflammation in
the perigastric region.

Kidneys bilaterally show no mass or hydronephrosis on either side.
There is no renal or ureteral calculus on either side.

There is a filter in the right common iliac artery with the apex at
the junction of the right common iliac artery and inferior vena
cava, unchanged from the 4844 study.

There is a midline ventral hernia which contains a loop of small
bowel. There is no evidence suggesting bowel compromise in this
area. Specifically, this loop of small bowel does not have a
thickened wall, and there is no pneumatosis of bowel in this area.
There is no bowel obstruction on this study. No free air or portal
venous air.

In the pelvis, the urinary bladder is midline with wall thickness
within normal limits. There is no pelvic mass or pelvic fluid
collection. Appendix is absent.

There is no ascites, adenopathy, or abscess in the abdomen or
pelvis. There is atherosclerotic change in aorta but no aneurysm.
There is postoperative change in the lower lumbar spine. There are
no blastic or lytic bone lesions.
IMPRESSION: There is a midline ventral hernia which contains a loop of small
bowel but no bowel compromise.

Patient is status post gastric bypass surgery. No perigastric
inflammation identified. There is no bowel obstruction on this
study. No free air.

Gallbladder and appendix are absent. Note that there is mild common
bile duct dilatation without mass or calculus seen by CT.

Atherosclerotic change. Note that there are foci of coronary
calcification.

Filter is in the common iliac artery on the right with the apex at
the junction of the right common iliac artery and inferior vena
cava. This appearance is stable compared to prior study from 4844.

## 2017-02-07 DIAGNOSIS — G5602 Carpal tunnel syndrome, left upper limb: Secondary | ICD-10-CM | POA: Diagnosis not present

## 2017-02-07 DIAGNOSIS — Z6841 Body Mass Index (BMI) 40.0 and over, adult: Secondary | ICD-10-CM | POA: Diagnosis not present

## 2017-02-23 ENCOUNTER — Other Ambulatory Visit: Payer: Self-pay | Admitting: Family Medicine

## 2017-02-27 ENCOUNTER — Ambulatory Visit: Payer: Medicare Other

## 2017-02-28 ENCOUNTER — Ambulatory Visit (INDEPENDENT_AMBULATORY_CARE_PROVIDER_SITE_OTHER): Payer: Medicare Other

## 2017-02-28 DIAGNOSIS — Z23 Encounter for immunization: Secondary | ICD-10-CM

## 2017-02-28 MED ORDER — ZOSTER VAC RECOMB ADJUVANTED 50 MCG/0.5ML IM SUSR: 0.5000 mL | Freq: Once | INTRAMUSCULAR | 0 refills | Status: AC

## 2017-04-10 ENCOUNTER — Encounter: Payer: Self-pay | Admitting: Internal Medicine

## 2017-04-11 ENCOUNTER — Ambulatory Visit (INDEPENDENT_AMBULATORY_CARE_PROVIDER_SITE_OTHER): Payer: Medicare Other | Admitting: Internal Medicine

## 2017-04-11 ENCOUNTER — Encounter: Payer: Self-pay | Admitting: Internal Medicine

## 2017-04-11 VITALS — BP 130/70 | HR 68 | Ht 59.0 in | Wt 201.2 lb

## 2017-04-11 DIAGNOSIS — G4733 Obstructive sleep apnea (adult) (pediatric): Secondary | ICD-10-CM

## 2017-04-11 DIAGNOSIS — G47419 Narcolepsy without cataplexy: Secondary | ICD-10-CM

## 2017-04-11 NOTE — Patient Instructions (Signed)
Order- [facility] schedule [facility]  We can continue CPAP auto 5-20, mask of choice, humidifier, supplies, AirView    Suggest you retry Astelin nasal spray    1-2 puffs affected nostril(s) once or twice daily as needed for drainage.  Retry your Ritalin tabs  1 or 2 twice daily, at breakfast and early afternoon, as needed. Nap when you can.

## 2017-04-11 NOTE — Progress Notes (Signed)
Subjective:    Patient ID: [name], female    DOB: [birth year], 72 y.o.   MRN: [id number]  HPI female never smoker followed for allergic rhinitis, asthma, OSA, Narcolepsy, complicated by GERD, DM, HBP, obesity/ bariatric surgery, pacemaker PFT 11/24/07- WNL FEV1/FVC 0.87 NPSG 03/23/1999- AHI 156/ hr, weight 202lbs before bariatric surgery, CPAP recently 12. [facility] screen 07/24/12- AHI 2.0/ hr, weight 209 lbs NPSG 01/25/13- AHI 7.6/ hr, mild OSA, weight 213 lbs. MSLT 01/26/13- mean latency 0.9 minutes, SOREM 1/5 naps. Pathologic daytime sleepiness consistent with narcolepsy or idiopathic, but nonspecific because she had taken Klonopin and Lamictal the night before, Cymbalta and Xyzal the day of the test.  ---------------------------------------------------------------------------------- 04/10/2816-72 year old female never smoker followed for allergic rhinitis, asthma, OSA/Narcolepsy, complicated by GERD, DM, HBP, obesity/bariatric surgery, pacemaker Allergy vaccine program at this office discontinued 01/21/2017 CPAP auto 5-20 [facility] FOLLOWS FOR: Pt continues to wear CPAP ; DL attached. DME [facility]. Pt needs new mask now.  Complains that she stays sleepy, fights falling asleep at work. Previous MSLT was limited by the fact that she had taken several medications current probable of affecting sleepiness at the time of the study despite instruction. She is confident she gets at least 7 or 7-1/2 hours of sleep each night with little sleep disturbance. Download confirms 98% for our compliance with AHI 1.3/hour. She takes trazodone 100 mg at bedtime with nortriptyline 10 mg and continue Cymbalta. Daily Astelin nasal spray and Xyzal. I explained all of these might contribute to daytime sleepiness. Coffee only in the morning. She tries to avoid daytime naps so she can sleep well at night. No symptoms suggestive of cataplexy. TSH was normal per her PCP. Full face mask. Dry mouth in the  mornings. Aware of Biotene availability. She stopped using Ritalin 10 mg twice daily because it was ineffective. No longer recognizes asthma with no wheezing and no need for inhalers. Clear postnasal drainage increased with spring pollen season now. Being treated for trigeminal neuralgia.  04/11/17- 72 year old female never smoker followed for allergic rhinitis, asthma, OSA/Narcolepsy, complicated by GERD, DM, HBP, obesity/bariatric surgery, pacemaker CPAP auto 5-20/ [facility] ----OSA: DME: [facility]-Pt wears CPAP nightly but continues to wake up very tired and has had trouble with mask-can not get good mask fit from DME. No DL today-will need to request one.  She is retired now with more flexibility about her sleep schedule.  Ritalin not using.  She is more aware of waking tired.  Falls asleep easily if quiet but not with any distraction like driving.  No cataplexy.  Does think she is better off with CPAP-sleeps better.  Not satisfied with mask fit. Download 80% compliance AHI is 2.9/hour.  Full facemask.  Review of Systems-see HPI + = positive Constitutional:   No-   weight loss, night sweats, fevers, chills, +fatigue, lassitude. HEENT:   No-  headaches, difficulty swallowing, tooth/dental problems, sore throat,       No-  sneezing, itching, ear ache,  nasal congestion, +post nasal drip,  CV:  No-   chest pain, orthopnea, PND, swelling in lower extremities, anasarca, dizziness, palpitations Resp: No-   shortness of breath with exertion or at rest.              No- productive cough,   non-productive cough,  No-  coughing up of blood.              No-   change in color of mucus.  No- wheezing.   Skin:  No-   rash or lesions. GI:  No-   heartburn, indigestion, abdominal pain, nausea, vomiting, GU:  MS:  No-   joint pain or swelling.   Neuro- nothing unusual Psych:  No- change in mood or affect. No depression or anxiety.  No memory loss.    Objective:   Physical Exam General- Alert, Oriented,  Affect-appropriate, Distress- none acute, + obese Skin- rash-none, lesions- none, excoriation-none Lymphadenopathy- none Head- atraumatic            Eyes- Gross vision intact, PERRLA, conjunctivae clear, with watery thin secretions            Ears- Hearing, canals normal, TMs normal            Nose- Clear, No- Septal dev, mucus, polyps, erosion, perforation,             Throat- Mallampati III , mucosa clear , drainage- none, tonsils- atrophic Neck- flexible , trachea midline, no stridor , thyroid nl, carotid no bruit Chest - symmetrical excursion , unlabored           Heart/CV- RRR , +trace systolic murmur? TI or AS , no gallop  , no rub, nl s1s2                           - JVD- none , edema- none, stasis changes- none, varices- right calf           Lung-  Clear chest, Cough-none, wheeze- none , dullness-none, rub- none           Chest wall-  Abd-  Br/ Gen/ Rectal- Not done, not indicated Extrem- cyanosis- none, clubbing, none, atrophy- none, strength- nl Neuro- grossly intact to observation  Assessment & Plan:

## 2017-04-15 ENCOUNTER — Other Ambulatory Visit: Payer: Self-pay | Admitting: Family Medicine

## 2017-04-15 ENCOUNTER — Ambulatory Visit (HOSPITAL_BASED_OUTPATIENT_CLINIC_OR_DEPARTMENT_OTHER): Payer: Medicare Other | Attending: Internal Medicine | Admitting: Radiology

## 2017-04-15 DIAGNOSIS — G4733 Obstructive sleep apnea (adult) (pediatric): Secondary | ICD-10-CM

## 2017-04-17 NOTE — Assessment & Plan Note (Signed)
We can continue CPAP auto 5-20 but are going to schedule mask fitting.

## 2017-04-17 NOTE — Assessment & Plan Note (Signed)
We want to make sure CPAP works well and that sleep habits are appropriate, including naps and her responsibility to drive safely.  Beyond that we are going to retry Ritalin after discussion.

## 2017-04-22 ENCOUNTER — Other Ambulatory Visit: Payer: Self-pay | Admitting: Internal Medicine

## 2017-04-23 ENCOUNTER — Other Ambulatory Visit: Payer: Self-pay | Admitting: Obstetrics and Gynecology

## 2017-04-23 DIAGNOSIS — Z1231 Encounter for screening mammogram for malignant neoplasm of breast: Secondary | ICD-10-CM

## 2017-04-29 ENCOUNTER — Ambulatory Visit (INDEPENDENT_AMBULATORY_CARE_PROVIDER_SITE_OTHER): Payer: Medicare Other | Admitting: Family Medicine

## 2017-04-29 ENCOUNTER — Encounter: Payer: Self-pay | Admitting: Family Medicine

## 2017-04-29 VITALS — BP 128/68 | HR 67 | Temp 98.6°F | Ht 59.0 in | Wt 199.0 lb

## 2017-04-29 DIAGNOSIS — J209 Acute bronchitis, unspecified: Secondary | ICD-10-CM | POA: Diagnosis not present

## 2017-04-29 MED ORDER — BENZONATATE 100 MG PO CAPS: 100.0000 mg | ORAL_CAPSULE | Freq: Three times a day (TID) | ORAL | 1 refills | Status: DC | PRN

## 2017-04-29 NOTE — Progress Notes (Signed)
Chief Complaint  Patient presents with  . Cough  . Nasal Congestion    [name] here for URI complaints.  Duration: 1 week  Associated symptoms: sinus congestion, rhinorrhea, itchy watery eyes, wheezing and dry cough Denies: sinus pain, ear pain, ear drainage, sore throat, shortness of breath, myalgia and fevers Treatment to date: Nyquil, Xyzal daily, Benadryl, Astelin Sick contacts: No  ROS:  Const: Denies fevers HEENT: As noted in HPI Lungs: +cough  Past Medical History:  Diagnosis Date  . Abdominal pain 08/18/2014  . Allergic state 09/21/2010  . Allergy    rhinitis  . Anemia 02-01-12   iron absorption problem  . Arthritis 02-01-12   osteoarthritis, Back pain, spine surgeries ? retain hardware cervial and  lumbar.  . Asthma   . Back pain 01/08/2011  . Bronchitis, acute 10/31/2010  . Cancer ([place]) 02-01-12   squamous cell -face  . Depression with anxiety 01/08/2011  . Esophageal reflux   . Fatigue 06/11/2011  . Fibromyalgia   . Heart murmur   . Hyperparathyroidism ([place]) 08/18/2014  . Knee pain, right 09/20/2011  . Motion sickness 12/24/2011  . Muscle cramps 08/30/2016  . Nausea & vomiting 12/24/2011  . Obesity   . Osteopenia 08/05/2014  . Otitis media 09/20/2011  . Otitis media of both ears 11/16/2010  . Otitis media of left ear 09/19/2010  . Panic attacks   . Pelvic floor dysfunction 09/02/2016  . Sinusitis 06/11/2011  . Sinusitis 12/24/2011  . Sleep apnea, obstructive   . Varicose veins of lower limb 10/02/2011   BP 128/68 (BP Location: Left Arm, Patient Position: Sitting, Cuff Size: Large)   Pulse 67   Temp 98.6 F (37 C) (Oral)   Ht 4\' 11"  (1.499 m)   Wt 199 lb (90.3 kg)   SpO2 95%   BMI 40.19 kg/m  General: Awake, alert, appears stated age HEENT: AT, [place], ears patent b/l and TM's neg, nares patent w/o discharge, pharynx pink and without exudates, MMM Neck: No masses or asymmetry Heart: RRR Lungs: CTAB, no accessory muscle use, coughing on deep  inspiration Psych: Age appropriate judgment and insight, normal mood and affect  Acute bronchitis, unspecified organism - Plan: benzonatate (TESSALON) 100 MG capsule  Orders as above. Go back on Flonase, control s/s's. Advised this can take up to 3 weeks to clear.  Continue to push fluids, practice good hand hygiene, cover mouth when coughing. F/u prn. If starting to experience fevers, shaking, or shortness of breath, seek immediate care. Pt voiced understanding and agreement to the plan.  [place], DO 04/29/17 2:32 PM

## 2017-04-29 NOTE — Progress Notes (Signed)
Pre visit review using our clinic review tool, if applicable. No additional management support is needed unless otherwise documented below in the visit note. 

## 2017-04-29 NOTE — Patient Instructions (Signed)
Continue to push fluids, practice good hand hygiene, and cover your mouth if you cough.  If you start having fevers, shaking or shortness of breath, seek immediate care.  I think you have bronchitis. Most causes of bronchitis are viral in nature. If we prescribe antibiotics for viral illnesses, not only will it fail to help you get better, but has also been shown to do harm.   Go back on the Flonase in addition to your other medicine.  Let us know if you need anything.

## 2017-04-30 ENCOUNTER — Ambulatory Visit
Admission: RE | Admit: 2017-04-30 | Discharge: 2017-04-30 | Disposition: A | Discharge status: Home / Self Care | Payer: Medicare Other | Source: Ambulatory Visit | Attending: Obstetrics and Gynecology | Admitting: Obstetrics and Gynecology

## 2017-04-30 DIAGNOSIS — Z1231 Encounter for screening mammogram for malignant neoplasm of breast: Secondary | ICD-10-CM

## 2017-05-06 DIAGNOSIS — R0989 Other specified symptoms and signs involving the circulatory and respiratory systems: Secondary | ICD-10-CM | POA: Diagnosis not present

## 2017-05-06 DIAGNOSIS — I4510 Unspecified right bundle-branch block: Secondary | ICD-10-CM | POA: Diagnosis not present

## 2017-05-06 DIAGNOSIS — I350 Nonrheumatic aortic (valve) stenosis: Secondary | ICD-10-CM | POA: Diagnosis not present

## 2017-05-06 DIAGNOSIS — E785 Hyperlipidemia, unspecified: Secondary | ICD-10-CM | POA: Diagnosis not present

## 2017-05-20 DIAGNOSIS — L814 Other melanin hyperpigmentation: Secondary | ICD-10-CM | POA: Diagnosis not present

## 2017-05-20 DIAGNOSIS — L821 Other seborrheic keratosis: Secondary | ICD-10-CM | POA: Diagnosis not present

## 2017-05-20 DIAGNOSIS — Z85828 Personal history of other malignant neoplasm of skin: Secondary | ICD-10-CM | POA: Diagnosis not present

## 2017-05-20 DIAGNOSIS — D225 Melanocytic nevi of trunk: Secondary | ICD-10-CM | POA: Diagnosis not present

## 2017-05-20 DIAGNOSIS — D1800 Hemangioma unspecified site: Secondary | ICD-10-CM | POA: Diagnosis not present

## 2017-05-21 ENCOUNTER — Other Ambulatory Visit: Payer: Self-pay | Admitting: Family Medicine

## 2017-05-21 DIAGNOSIS — I779 Disorder of arteries and arterioles, unspecified: Secondary | ICD-10-CM

## 2017-05-21 DIAGNOSIS — I739 Peripheral vascular disease, unspecified: Principal | ICD-10-CM

## 2017-05-29 DIAGNOSIS — M79672 Pain in left foot: Secondary | ICD-10-CM | POA: Diagnosis not present

## 2017-06-04 DIAGNOSIS — M79672 Pain in left foot: Secondary | ICD-10-CM | POA: Diagnosis not present

## 2017-06-08 ENCOUNTER — Other Ambulatory Visit: Payer: Self-pay | Admitting: Family Medicine

## 2017-06-11 DIAGNOSIS — R0989 Other specified symptoms and signs involving the circulatory and respiratory systems: Secondary | ICD-10-CM | POA: Diagnosis not present

## 2017-06-11 DIAGNOSIS — I350 Nonrheumatic aortic (valve) stenosis: Secondary | ICD-10-CM | POA: Diagnosis not present

## 2017-06-16 NOTE — Progress Notes (Signed)
[name] [place], [place] [address] Phone: [phone] Subjective:     CC: Knee pain foot pain  ERX:[id number]  [name] is a 72 y.o. female coming in with complaint of left foot pain. Has a stress fracture and wore a boot for a while. Knees and hip start to hurt. Stopped wearing boot.   Onset- Chronic Location- Lateral Duration-  Character- Achy Aggravating factors- Walking on it  Reliving factors- Staying off of it, elevation Therapies tried-  Severity-9 out of 10.  Has had a history of the knee replacement on the right side and has been diagnosed with a pedis anserine bursitis.     Past Medical History:  Diagnosis Date  . Abdominal pain 08/18/2014  . Allergic state 09/21/2010  . Allergy    rhinitis  . Anemia 02-01-12   iron absorption problem  . Arthritis 02-01-12   osteoarthritis, Back pain, spine surgeries ? retain hardware cervial and  lumbar.  . Asthma   . Back pain 01/08/2011  . Bronchitis, acute 10/31/2010  . Cancer ([place]) 02-01-12   squamous cell -face  . Depression with anxiety 01/08/2011  . Esophageal reflux   . Fatigue 06/11/2011  . Fibromyalgia   . Heart murmur   . Hyperparathyroidism ([place]) 08/18/2014  . Knee pain, right 09/20/2011  . Motion sickness 12/24/2011  . Muscle cramps 08/30/2016  . Nausea & vomiting 12/24/2011  . Obesity   . Osteopenia 08/05/2014  . Otitis media 09/20/2011  . Otitis media of both ears 11/16/2010  . Otitis media of left ear 09/19/2010  . Panic attacks   . Pelvic floor dysfunction 09/02/2016  . Sinusitis 06/11/2011  . Sinusitis 12/24/2011  . Sleep apnea, obstructive   . Varicose veins of lower limb 10/02/2011   Past Surgical History:  Procedure Laterality Date  . ABDOMINAL SURGERY  02-01-12   ABDOMINAL SURGERY  . APPENDECTOMY  1962  . BARIATRIC SURGERY    . BLADDER SUSPENSION     complicated by bowel nick/ clolostomy  . BLEPHAROPLASTY Bilateral   . CARPAL TUNNEL RELEASE  02-01-12     right  . CATARACT EXTRACTION Bilateral   . CERVICAL DISC SURGERY     C-7 in 2004, C-4 and C-5 in 2010  . colostomy bag     Confirm with patient. Listed under medical conditions on form dated 07/18/09.  [name] COLOSTOMY REVERSAL  02-01-12  . EYE SURGERY     b/l cataracts and b/l blepharplasty  . [place]  . GASTRIC BYPASS  2003   Patient also noted "gastric bypass resection - 2004"  . HERNIA REPAIR  2009   Two hernias and abdominal reconstruction  . ivc filter     prophyllactically- no hx DVT  . LUMBAR DISC SURGERY     L1-L3 all had surgery most recently in 2017  . LUMBAR DISC SURGERY     L3-L4, Dr. Trenton Gammon  . pannilectomy     Per medical history form dated 07/18/09.  [name] TOTAL KNEE ARTHROPLASTY  02/11/2012   Procedure: TOTAL KNEE ARTHROPLASTY;  Surgeon: Mauri Pole, MD;  Location: [facility];  Service: Orthopedics;  Laterality: Right;  . ULNAR NERVE REPAIR Left 07/18/2016   by Dr.Henry Pool  . VULVA SURGERY  02-01-12   cyst removal-pt 8 months pregnant   Social History   Socioeconomic History  . Marital status: Married    Spouse name: Not on file  . Number of children: Not on  file  . Years of education: Not on file  . Highest education level: Not on file  Occupational History  . Not on file  Social Needs  . Financial resource strain: Not on file  . Food insecurity:    Worry: Not on file    Inability: Not on file  . Transportation needs:    Medical: Not on file    Non-medical: Not on file  Tobacco Use  . Smoking status: Never Smoker  . Smokeless tobacco: Never Used  Substance and Sexual Activity  . Alcohol use: No    Alcohol/week: 0.0 oz    Comment: special occasions  . Drug use: No  . Sexual activity: Never  Lifestyle  . Physical activity:    Days per week: Not on file    Minutes per session: Not on file  . Stress: Not on file  Relationships  . Social connections:    Talks on phone: Not on file    Gets together: Not on file    Attends religious  service: Not on file    Active member of club or organization: Not on file    Attends meetings of clubs or organizations: Not on file    Relationship status: Not on file  Other Topics Concern  . Not on file  Social History Narrative  . Not on file   Allergies  Allergen Reactions  . Hydrocodone Nausea And Vomiting     nausea and vomiting and headaches  . Myrbetriq [Mirabegron] Hives    Hives and itching  . Advil [Ibuprofen] Hives and Itching  . Amoxicillin Hives  . Cephalexin      Neuropathy  . Codeine     Crazy in the head, bp drops  . Levofloxacin Itching  . Lyrica [Pregabalin] Other (See Comments)    "Makes me like a zombie. I'm awake but I'm in slow motion."  . Meperidine Hcl     B/P drops  . Morphine And Related      States" extreme migraines"  . Oxycodone-Acetaminophen Other (See Comments)    Crazy in head, drop in bp  . Oxycodone-Acetaminophen Other (See Comments)  . Sulfonamide Derivatives Nausea And Vomiting    Stomach upset  . Adhesive [Tape] Rash    Latex in adhesive tape. Please use paper tape  . Erythromycin Diarrhea and Rash    Other reaction(s): GI Intolerance  . Penicillins Nausea And Vomiting and Rash   Family History  Problem Relation Age of Onset  . Alcohol abuse Mother   . Other Mother        accidental med overdose  . Emphysema Mother        smoked  . Bipolar disorder Mother   . Other Father        CHF  . Diabetes Father   . Cancer Father        throat ca/ smoked  . Alcohol abuse Father   . Stroke Father   . Hypertension Brother   . Hyperlipidemia Brother   . Obesity Brother   . Heart attack Maternal Grandfather   . Heart attack Paternal Grandmother   . Heart attack Paternal Grandfather   . Arthritis Son        psoriatic  . Arthritis Son        psoriatic  . Obesity Son      Past medical history, social, surgical and family history all reviewed in electronic medical record.  No pertanent information unless stated regarding to the  chief  complaint.   Review of Systems:Review of systems updated and as accurate as of 06/18/17  No headache, visual changes, nausea, vomiting, diarrhea, constipation, dizziness, abdominal pain, skin rash, fevers, chills, night sweats, weight loss, swollen lymph nodes, body aches, joint swelling, , chest pain, shortness of breath, mood changes.  Positive muscle aches  Objective  Blood pressure 116/72, pulse (!) 59, height 4\' 11"  (1.499 m), weight 196 lb (88.9 kg), SpO2 95 %. Systems examined below as of 06/18/17   General: No apparent distress alert and oriented x3 mood and affect normal, dressed appropriately.  HEENT: Pupils equal, extraocular movements intact  Respiratory: Patient's speak in full sentences and does not appear short of breath  Cardiovascular: No lower extremity edema, non tender, no erythema  Skin: Warm dry intact with no signs of infection or rash on extremities or on axial skeleton.  Abdomen: Soft nontender  Neuro: Cranial nerves II through XII are intact, neurovascularly intact in all extremities with 2+ DTRs and 2+ pulses.  Lymph: No lymphadenopathy of posterior or anterior cervical chain or axillae bilaterally.  Gait normal with good balance and coordination.  MSK:  Non tender with full range of motion and good stability and symmetric strength and tone of shoulders, elbows, wrist, hip, and ankles bilaterally.  Regarding changes of multiple joints. Knee: Right Patient does have a knee replacement with no swelling Tender to palpation over medial and PF joint line.  More tenderness though over the peds anserine area ROM full in flexion and extension and lower leg rotation. instability with valgus force.  painful patellar compression. Patellar glide with moderate crepitus. Patellar and quadriceps tendons unremarkable. Hamstring and quadriceps strength is normal. Contralateral knee shows arthritic changes but minimally tender.  Procedure: Real-time Ultrasound Guided  Injection of right pes anserine Device: GE Logiq Q7 Ultrasound guided injection is preferred based studies that show increased duration, increased effect, greater accuracy, decreased procedural pain, increased response rate, and decreased cost with ultrasound guided versus blind injection.  Verbal informed consent obtained.  Time-out conducted.  Noted no overlying erythema, induration, or other signs of local infection.  Skin prepped in a sterile fashion.  Local anesthesia: Topical Ethyl chloride.  With sterile technique and under real time ultrasound guidance: With a 25-gauge 1 inch needle patient was injected with 0.5 cc of 0.5% Marcaine and 0.5 cc of Kenalog 40 mg/mL Completed without difficulty  Pain immediately resolved suggesting accurate placement of the medication.  Advised to call if fevers/chills, erythema, induration, drainage, or persistent bleeding.  Images permanently stored and available for review in the ultrasound unit.  Impression: Technically successful ultrasound guided injection.   Impression and Recommendations:     This case required medical decision making of moderate complexity.      Note: This dictation was prepared with Dragon dictation along with smaller phrase technology. Any transcriptional errors that result from this process are unintentional.

## 2017-06-18 ENCOUNTER — Ambulatory Visit: Payer: Self-pay

## 2017-06-18 ENCOUNTER — Encounter: Payer: Self-pay | Admitting: Family Medicine

## 2017-06-18 ENCOUNTER — Ambulatory Visit (INDEPENDENT_AMBULATORY_CARE_PROVIDER_SITE_OTHER): Payer: Medicare Other | Admitting: Family Medicine

## 2017-06-18 VITALS — BP 116/72 | HR 59 | Ht 59.0 in | Wt 196.0 lb

## 2017-06-18 DIAGNOSIS — M79672 Pain in left foot: Secondary | ICD-10-CM | POA: Diagnosis not present

## 2017-06-18 DIAGNOSIS — M7050 Other bursitis of knee, unspecified knee: Secondary | ICD-10-CM | POA: Diagnosis not present

## 2017-06-18 NOTE — Patient Instructions (Signed)
[name]  [name] [name], [name] or try a [name] sandal and see what you think Injected the bursae on the knee and it should help  pennsaid pinkie amount topically 2 times daily as needed.  I would try to avoid being barefoot another month other then being on the beach  See me again in 4-6 weeks

## 2017-06-18 NOTE — Assessment & Plan Note (Signed)
Patient given injection today.  Tolerated the procedure well.  We discussed icing regimen, compression sleeves, topical anti-inflammatories.  Patient does have a knee replacement .  Follow-up again in 4 weeks

## 2017-06-19 DIAGNOSIS — I4510 Unspecified right bundle-branch block: Secondary | ICD-10-CM | POA: Diagnosis not present

## 2017-06-19 DIAGNOSIS — R0989 Other specified symptoms and signs involving the circulatory and respiratory systems: Secondary | ICD-10-CM | POA: Diagnosis not present

## 2017-06-19 DIAGNOSIS — E785 Hyperlipidemia, unspecified: Secondary | ICD-10-CM | POA: Diagnosis not present

## 2017-06-19 DIAGNOSIS — I350 Nonrheumatic aortic (valve) stenosis: Secondary | ICD-10-CM | POA: Diagnosis not present

## 2017-06-26 ENCOUNTER — Ambulatory Visit: Payer: Medicare Other | Admitting: Family Medicine

## 2017-06-30 ENCOUNTER — Other Ambulatory Visit: Payer: Self-pay | Admitting: Family Medicine

## 2017-06-30 DIAGNOSIS — N3281 Overactive bladder: Secondary | ICD-10-CM

## 2017-07-03 ENCOUNTER — Encounter: Payer: Self-pay | Admitting: Family Medicine

## 2017-07-03 ENCOUNTER — Ambulatory Visit (INDEPENDENT_AMBULATORY_CARE_PROVIDER_SITE_OTHER): Payer: Medicare Other | Admitting: Family Medicine

## 2017-07-03 VITALS — BP 132/72 | HR 62 | Temp 98.5°F | Ht 60.0 in | Wt 197.1 lb

## 2017-07-03 DIAGNOSIS — F418 Other specified anxiety disorders: Secondary | ICD-10-CM | POA: Diagnosis not present

## 2017-07-03 DIAGNOSIS — R399 Unspecified symptoms and signs involving the genitourinary system: Secondary | ICD-10-CM | POA: Diagnosis not present

## 2017-07-03 DIAGNOSIS — N941 Unspecified dyspareunia: Secondary | ICD-10-CM | POA: Diagnosis not present

## 2017-07-03 DIAGNOSIS — R32 Unspecified urinary incontinence: Secondary | ICD-10-CM | POA: Diagnosis not present

## 2017-07-03 DIAGNOSIS — E782 Mixed hyperlipidemia: Secondary | ICD-10-CM | POA: Diagnosis not present

## 2017-07-03 DIAGNOSIS — I10 Essential (primary) hypertension: Secondary | ICD-10-CM

## 2017-07-03 DIAGNOSIS — E669 Obesity, unspecified: Secondary | ICD-10-CM

## 2017-07-03 DIAGNOSIS — N952 Postmenopausal atrophic vaginitis: Secondary | ICD-10-CM | POA: Diagnosis not present

## 2017-07-03 DIAGNOSIS — Z6839 Body mass index (BMI) 39.0-39.9, adult: Secondary | ICD-10-CM | POA: Diagnosis not present

## 2017-07-03 DIAGNOSIS — E1169 Type 2 diabetes mellitus with other specified complication: Secondary | ICD-10-CM | POA: Diagnosis not present

## 2017-07-03 DIAGNOSIS — Z01419 Encounter for gynecological examination (general) (routine) without abnormal findings: Secondary | ICD-10-CM | POA: Diagnosis not present

## 2017-07-03 DIAGNOSIS — D500 Iron deficiency anemia secondary to blood loss (chronic): Secondary | ICD-10-CM | POA: Diagnosis not present

## 2017-07-03 DIAGNOSIS — M8589 Other specified disorders of bone density and structure, multiple sites: Secondary | ICD-10-CM | POA: Diagnosis not present

## 2017-07-03 DIAGNOSIS — Z13820 Encounter for screening for osteoporosis: Secondary | ICD-10-CM | POA: Diagnosis not present

## 2017-07-03 NOTE — Patient Instructions (Addendum)
1-2 days to get results of labs back.   Stay active. Keep the diet clean.  Let us know if you need anything.

## 2017-07-03 NOTE — Progress Notes (Signed)
Pre visit review using our clinic review tool, if applicable. No additional management support is needed unless otherwise documented below in the visit note. 

## 2017-07-03 NOTE — Progress Notes (Signed)
Chief Complaint  Patient presents with  . Follow-up    Subjective: Patient is a 72 y.o. female here for med check.  Hypertension Patient presents for hypertension follow up. She is diet controlled. She is adhering to a healthy diet overall. Exercise: physically active at home  DM Well-controlled diabetes, diet controlled. She is on Crestor. Up-to-date with immunizations.  Hyperlipidemia Patient presents for hyperlipidemia follow up. Currently being treated with Crestor 20 mg daily and compliance with treatment thus far has been good. She denies myalgias. She is adhering to a healthy. The patient exercises never.  Denies chest pain or shortness of breath.  History of depression and anxiety She is well-controlled on Cymbalta, cannot remember the last time she used Xanax. She uses trazodone nightly to help with sleep.  This is working well. No thoughts of harming herself or others.  ROS: Heart: Denies chest pain  Lungs: Denies SOB   Family History  Problem Relation Age of Onset  . Alcohol abuse Mother   . Other Mother        accidental med overdose  . Emphysema Mother        smoked  . Bipolar disorder Mother   . Other Father        CHF  . Diabetes Father   . Cancer Father        throat ca/ smoked  . Alcohol abuse Father   . Stroke Father   . Hypertension Brother   . Hyperlipidemia Brother   . Obesity Brother   . Heart attack Maternal Grandfather   . Heart attack Paternal Grandmother   . Heart attack Paternal Grandfather   . Arthritis Son        psoriatic  . Arthritis Son        psoriatic  . Obesity Son    Past Medical History:  Diagnosis Date  . Abdominal pain 08/18/2014  . Allergy    rhinitis  . Anemia 02-01-12   iron absorption problem  . Arthritis 02-01-12   osteoarthritis, Back pain, spine surgeries ? retain hardware cervial and  lumbar.  . Cancer ([place]) 02-01-12   squamous cell -face  . Depression with anxiety 01/08/2011  . Esophageal reflux    . Fibromyalgia   . Heart murmur   . Hyperparathyroidism ([place]) 08/18/2014  . Knee pain, right 09/20/2011  . Motion sickness 12/24/2011  . Nausea & vomiting 12/24/2011  . Obesity   . Osteopenia 08/05/2014  . Panic attacks   . Pelvic floor dysfunction 09/02/2016  . Sleep apnea, obstructive   . Varicose veins of lower limb 10/02/2011   Allergies  Allergen Reactions  . Hydrocodone Nausea And Vomiting     nausea and vomiting and headaches  . Myrbetriq [Mirabegron] Hives    Hives and itching  . Advil [Ibuprofen] Hives and Itching  . Amoxicillin Hives  . Cephalexin      Neuropathy  . Codeine     Crazy in the head, bp drops  . Levofloxacin Itching  . Lyrica [Pregabalin] Other (See Comments)    "Makes me like a zombie. I'm awake but I'm in slow motion."  . Meperidine Hcl     B/P drops  . Morphine And Related      States" extreme migraines"  . Oxycodone-Acetaminophen Other (See Comments)    Crazy in head, drop in bp  . Oxycodone-Acetaminophen Other (See Comments)  . Sulfonamide Derivatives Nausea And Vomiting    Stomach upset  . Adhesive [Tape] Rash    Latex  in adhesive tape. Please use paper tape  . Erythromycin Diarrhea and Rash    Other reaction(s): GI Intolerance  . Penicillins Nausea And Vomiting and Rash    Current Outpatient Medications:  .  ALPRAZolam (XANAX) 0.25 MG tablet, Take 1 tablet (0.25 mg total) by mouth daily as needed. For anxiety, Disp: 30 tablet, Rfl: 3 .  aspirin 81 MG tablet, Take 81 mg by mouth daily., Disp: , Rfl:  .  azelastine (ASTELIN) 0.1 % nasal spray, USE 1 TO 2 DOSES IN EACH NOSTRIL ONCE DAILY TO TWICE DAILY AS NEEDED, Disp: 30 mL, Rfl: 12 .  benzonatate (TESSALON) 100 MG capsule, Take 1 capsule (100 mg total) by mouth 3 (three) times daily as needed., Disp: 30 capsule, Rfl: 1 .  DULoxetine (CYMBALTA) 60 MG capsule, TAKE 1 CAPSULE BY MOUTH ONCE DAILY, Disp: 90 capsule, Rfl: 1 .  fluticasone (FLONASE) 50 MCG/ACT nasal spray, 2 sprays each nostril once  or twice daily as neededs, Disp: 16 g, Rfl: 12 .  gabapentin (NEURONTIN) 100 MG capsule, Take 2 capsules (200 mg total) by mouth at bedtime., Disp: 180 capsule, Rfl: 1 .  hydrOXYzine (ATARAX/VISTARIL) 10 MG tablet, Take 1-2 tablets (10-20 mg total) by mouth 3 (three) times daily as needed for itching., Disp: 30 tablet, Rfl: 1 .  levocetirizine (XYZAL) 5 MG tablet, Take 5 mg by mouth every evening., Disp: , Rfl:  .  NON FORMULARY, CPAP- set on [employer] Disp: , Rfl:  .  OPTIMAL-D 08676 units capsule, TAKE 2 CAPSULES BY MOUTH ONCE A WEEK, Disp: 8 capsule, Rfl: 4 .  rosuvastatin (CRESTOR) 20 MG tablet, TAKE 1 TABLET BY MOUTH ONCE DAILY, Disp: 90 tablet, Rfl: 1 .  traZODone (DESYREL) 100 MG tablet, TAKE 1/2 TO 1 (ONE-HALF TO ONE) TABLET BY MOUTH ONCE DAILY AS NEEDED FOR SLEEP AS DIRECTED, Disp: 90 tablet, Rfl: 0 .  VESICARE 10 MG tablet, TAKE 1 TABLET BY MOUTH ONCE DAILY, Disp: 90 tablet, Rfl: 1  Objective: BP 132/72 (BP Location: Left Arm, Patient Position: Sitting, Cuff Size: Normal)   Pulse 62   Temp 98.5 F (36.9 C) (Oral)   Ht 5' (1.524 m)   Wt 197 lb 2 oz (89.4 kg)   SpO2 94%   BMI 38.50 kg/m   General: Awake, appears stated age HEENT: MMM, EOMi Heart: RRR, + murmur Lungs: CTAB, no rales, wheezes or rhonchi. No accessory muscle use Abd: BS+, soft, NT, ND, no masses or organomegaly Psych: Age appropriate judgment and insight, normal affect and mood  Assessment and Plan: Essential hypertension, benign - Plan: Comprehensive metabolic panel  Diabetes mellitus type 2 in obese ([place]) - Plan: Hemoglobin A1c, Microalbumin / creatinine urine ratio  Hyperlipidemia, mixed - Plan: Lipid panel  Iron deficiency anemia due to chronic blood loss - Plan: CBC  Depression with anxiety  Orders as above.  Counseled on diet and exercise. No changes for now.  Encouraged to contact her eye doctor. Follow-up in 6 months or as needed.  Will need foot exam. The patient voiced understanding and  agreement to the plan.  [place], DO 07/03/17  4:33 PM

## 2017-07-04 ENCOUNTER — Encounter: Payer: Self-pay | Admitting: Family Medicine

## 2017-07-04 LAB — HEMOGLOBIN A1C: Hgb A1c MFr Bld: 5.9 % (ref 4.6–6.5)

## 2017-07-04 LAB — COMPREHENSIVE METABOLIC PANEL
ALBUMIN: 4 g/dL (ref 3.5–5.2)
ALT: 16 U/L (ref 0–35)
AST: 20 U/L (ref 0–37)
Alkaline Phosphatase: 88 U/L (ref 39–117)
BUN: 14 mg/dL (ref 6–23)
CO2: 29 mEq/L (ref 19–32)
Calcium: 9.2 mg/dL (ref 8.4–10.5)
Chloride: 104 mEq/L (ref 96–112)
Creatinine, Ser: 0.92 mg/dL (ref 0.40–1.20)
GFR: 64.07 mL/min (ref >60.00)
Glucose, Bld: 104 mg/dL — ABNORMAL HIGH (ref 70–99)
POTASSIUM: 3.9 meq/L (ref 3.5–5.1)
SODIUM: 141 meq/L (ref 135–145)
Total Bilirubin: 0.4 mg/dL (ref 0.2–1.2)
Total Protein: 6.2 g/dL (ref 6.0–8.3)

## 2017-07-04 LAB — CBC
HEMATOCRIT: 33.5 % — AB (ref 36.0–46.0)
Hemoglobin: 11 g/dL — ABNORMAL LOW (ref 12.0–15.0)
MCHC: 33 g/dL (ref 30.0–36.0)
MCV: 87.8 fl (ref 78.0–100.0)
Platelets: 218 10*3/uL (ref 150.0–400.0)
RBC: 3.81 Mil/uL — AB (ref 3.87–5.11)
RDW: 16.4 % — ABNORMAL HIGH (ref 11.5–15.5)
WBC: 8.1 10*3/uL (ref 4.0–10.5)

## 2017-07-04 LAB — LIPID PANEL
CHOL/HDL RATIO: 2
Cholesterol: 106 mg/dL (ref 0–200)
HDL: 51.8 mg/dL (ref >39.00)
LDL CALC: 41 mg/dL (ref 0–99)
NonHDL: 53.9
Triglycerides: 66 mg/dL (ref 0.0–149.0)
VLDL: 13.2 mg/dL (ref 0.0–40.0)

## 2017-07-04 LAB — MICROALBUMIN / CREATININE URINE RATIO
Creatinine,U: 15 mg/dL
MICROALB/CREAT RATIO: 4.7 mg/g (ref 0.0–30.0)

## 2017-07-10 ENCOUNTER — Ambulatory Visit: Payer: Self-pay | Admitting: Family Medicine

## 2017-07-10 ENCOUNTER — Emergency Department (HOSPITAL_BASED_OUTPATIENT_CLINIC_OR_DEPARTMENT_OTHER): Payer: Medicare Other

## 2017-07-10 ENCOUNTER — Encounter (HOSPITAL_BASED_OUTPATIENT_CLINIC_OR_DEPARTMENT_OTHER): Payer: Self-pay

## 2017-07-10 ENCOUNTER — Emergency Department (HOSPITAL_BASED_OUTPATIENT_CLINIC_OR_DEPARTMENT_OTHER)
Admission: EM | Admit: 2017-07-10 | Discharge: 2017-07-10 | Disposition: A | Discharge status: Home / Self Care | Payer: Medicare Other | Attending: Emergency Medicine | Admitting: Emergency Medicine

## 2017-07-10 ENCOUNTER — Other Ambulatory Visit: Payer: Self-pay

## 2017-07-10 DIAGNOSIS — I10 Essential (primary) hypertension: Secondary | ICD-10-CM | POA: Diagnosis not present

## 2017-07-10 DIAGNOSIS — Z96651 Presence of right artificial knee joint: Secondary | ICD-10-CM | POA: Insufficient documentation

## 2017-07-10 DIAGNOSIS — Z79899 Other long term (current) drug therapy: Secondary | ICD-10-CM | POA: Diagnosis not present

## 2017-07-10 DIAGNOSIS — G8929 Other chronic pain: Secondary | ICD-10-CM | POA: Diagnosis not present

## 2017-07-10 DIAGNOSIS — M25561 Pain in right knee: Secondary | ICD-10-CM | POA: Insufficient documentation

## 2017-07-10 DIAGNOSIS — Z7982 Long term (current) use of aspirin: Secondary | ICD-10-CM | POA: Insufficient documentation

## 2017-07-10 DIAGNOSIS — Z85828 Personal history of other malignant neoplasm of skin: Secondary | ICD-10-CM | POA: Insufficient documentation

## 2017-07-10 DIAGNOSIS — E119 Type 2 diabetes mellitus without complications: Secondary | ICD-10-CM | POA: Diagnosis not present

## 2017-07-10 DIAGNOSIS — R55 Syncope and collapse: Secondary | ICD-10-CM | POA: Insufficient documentation

## 2017-07-10 DIAGNOSIS — R509 Fever, unspecified: Secondary | ICD-10-CM | POA: Insufficient documentation

## 2017-07-10 DIAGNOSIS — R2241 Localized swelling, mass and lump, right lower limb: Secondary | ICD-10-CM | POA: Insufficient documentation

## 2017-07-10 DIAGNOSIS — E860 Dehydration: Secondary | ICD-10-CM | POA: Insufficient documentation

## 2017-07-10 DIAGNOSIS — M7989 Other specified soft tissue disorders: Secondary | ICD-10-CM | POA: Diagnosis not present

## 2017-07-10 DIAGNOSIS — I2510 Atherosclerotic heart disease of native coronary artery without angina pectoris: Secondary | ICD-10-CM | POA: Insufficient documentation

## 2017-07-10 LAB — CBC WITH DIFFERENTIAL/PLATELET
BASOS ABS: 0 10*3/uL (ref 0.0–0.1)
BASOS PCT: 0 %
EOS ABS: 0.1 10*3/uL (ref 0.0–0.7)
Eosinophils Relative: 1 %
HCT: 34.5 % — ABNORMAL LOW (ref 36.0–46.0)
HEMOGLOBIN: 11.1 g/dL — AB (ref 12.0–15.0)
LYMPHS ABS: 1.5 10*3/uL (ref 0.7–4.0)
Lymphocytes Relative: 12 %
MCH: 28.6 pg (ref 26.0–34.0)
MCHC: 32.2 g/dL (ref 30.0–36.0)
MCV: 88.9 fL (ref 78.0–100.0)
Monocytes Absolute: 1.1 10*3/uL — ABNORMAL HIGH (ref 0.1–1.0)
Monocytes Relative: 9 %
NEUTROS PCT: 78 %
Neutro Abs: 10.2 10*3/uL — ABNORMAL HIGH (ref 1.7–7.7)
Platelets: 203 10*3/uL (ref 150–400)
RBC: 3.88 MIL/uL (ref 3.87–5.11)
RDW: 15.8 % — ABNORMAL HIGH (ref 11.5–15.5)
WBC: 13 10*3/uL — AB (ref 4.0–10.5)

## 2017-07-10 LAB — URINALYSIS, ROUTINE W REFLEX MICROSCOPIC
BILIRUBIN URINE: NEGATIVE
GLUCOSE, UA: NEGATIVE mg/dL
Hgb urine dipstick: NEGATIVE
KETONES UR: NEGATIVE mg/dL
Nitrite: NEGATIVE
PH: 6 (ref 5.0–8.0)
Protein, ur: NEGATIVE mg/dL
Specific Gravity, Urine: 1.01 (ref 1.005–1.030)

## 2017-07-10 LAB — COMPREHENSIVE METABOLIC PANEL
ALBUMIN: 3.6 g/dL (ref 3.5–5.0)
ALK PHOS: 75 U/L (ref 38–126)
ALT: 17 U/L (ref 14–54)
AST: 20 U/L (ref 15–41)
Anion gap: 8 (ref 5–15)
BUN: 17 mg/dL (ref 6–20)
CALCIUM: 8.5 mg/dL — AB (ref 8.9–10.3)
CO2: 25 mmol/L (ref 22–32)
CREATININE: 0.72 mg/dL (ref 0.44–1.00)
Chloride: 106 mmol/L (ref 101–111)
GFR calc Af Amer: >60 mL/min (ref >60)
GFR calc non Af Amer: >60 mL/min (ref >60)
GLUCOSE: 105 mg/dL — AB (ref 65–99)
Potassium: 3.5 mmol/L (ref 3.5–5.1)
SODIUM: 139 mmol/L (ref 135–145)
Total Bilirubin: 0.7 mg/dL (ref 0.3–1.2)
Total Protein: 6.4 g/dL — ABNORMAL LOW (ref 6.5–8.1)

## 2017-07-10 LAB — TROPONIN I: Troponin I: <0.03 ng/mL (ref <0.03)

## 2017-07-10 LAB — URINALYSIS, MICROSCOPIC (REFLEX)

## 2017-07-10 LAB — MAGNESIUM: Magnesium: 1.8 mg/dL (ref 1.7–2.4)

## 2017-07-10 MED ORDER — SODIUM CHLORIDE 0.9 % IV BOLUS
1000.0000 mL | Freq: Once | INTRAVENOUS | Status: AC
  Administered 2017-07-10: 1000 mL via INTRAVENOUS

## 2017-07-10 NOTE — Discharge Instructions (Signed)
Your work-up today was overall reassuring.  We suspect you are dehydrated leading to your syncope.  We suspect you probably have a viral infection causing your fever and based on our exam, we do not feel you have a septic joint at this time.  We had a shared decision making conversation as to the safety/utility of tapping the knee and together we decided not to.  Her x-rays were reassuring.  Please follow-up with your primary doctor for further management.  Please stay hydrated.  If any symptoms change or worsen, please return to the nearest emergency department.

## 2017-07-10 NOTE — ED Provider Notes (Signed)
[facility] EMERGENCY DEPARTMENT Provider Note   CSN: [id number] Arrival date & time: 07/10/17  1629     History   Chief Complaint Chief Complaint  Patient presents with  . Loss of Consciousness    HPI [name] is a 72 y.o. female.  The history is provided by the patient and medical records. No language interpreter was used.  Fever   This is a new problem. The current episode started 2 days ago. The problem occurs constantly. The problem has not changed since onset.The maximum temperature noted was 100 to 100.9 F. The temperature was taken using an oral thermometer. Pertinent negatives include no chest pain, no diarrhea, no vomiting, no congestion, no headaches, no sore throat, no muscle aches and no cough. She has tried nothing for the symptoms. The treatment provided no relief.  Loss of Consciousness   This is a chronic problem. The current episode started less than 1 hour ago. The problem occurs constantly. The problem has been resolved. She lost consciousness for a period of less than one minute. The problem is associated with standing up. Associated symptoms include fever. Pertinent negatives include back pain, chest pain, confusion, congestion, diaphoresis, headaches, light-headedness, nausea, palpitations, vomiting and weakness. She has tried nothing for the symptoms.    Past Medical History:  Diagnosis Date  . Allergy    rhinitis  . Anemia 02-01-12   iron absorption problem  . Arthritis 02-01-12   osteoarthritis, Back pain, spine surgeries ? retain hardware cervial and  lumbar.  . Cancer ([place]) 02-01-12   squamous cell -face  . Depression with anxiety 01/08/2011  . Esophageal reflux   . Fibromyalgia   . Heart murmur   . Hyperparathyroidism ([place]) 08/18/2014  . Knee pain, right 09/20/2011  . Motion sickness 12/24/2011  . Nausea & vomiting 12/24/2011  . Obesity   . Osteopenia 08/05/2014  . Panic attacks   . Pelvic floor dysfunction 09/02/2016  . Sleep apnea,  obstructive   . Varicose veins of lower limb 10/02/2011    Patient Active Problem List   Diagnosis Date Noted  . Carotid artery disease ([place]) 11/22/2016  . Degenerative arthritis of left knee 09/04/2016  . Pelvic floor dysfunction 09/02/2016  . Muscle cramps 08/30/2016  . Contusion of knee and lower leg, left, initial encounter 08/28/2016  . Cervical radiculopathy at C7 03/08/2016  . Lumbar radiculopathy 06/07/2015  . Pes anserine bursitis 09/30/2014  . Hyperparathyroidism ([place]) 08/18/2014  . Medicare annual wellness visit, subsequent 08/18/2014  . Osteopenia 08/05/2014  . Capsulitis of ankle 02/23/2014  . Leg length discrepancy 02/23/2014  . Plantar fasciitis of left foot 11/17/2013  . Pruritus 10/03/2013  . Narcolepsy without cataplexy 02/08/2013  . S/P right TKA 02/11/2012  . Motion sickness 12/24/2011  . Nausea & vomiting 12/24/2011  . Varicose veins of lower limb 10/02/2011  . Knee pain, right 09/20/2011  . Depression with anxiety 01/08/2011  . Back pain 01/08/2011  . SCC (squamous cell carcinoma), face 09/19/2010  . Otitis media of left ear 09/19/2010  . Chills 07/12/2010  . Menopause 07/12/2010  . Ventral hernia 04/06/2010  . COUGH 04/06/2010  . ABDOMINAL PAIN, GENERALIZED 04/06/2010  . ENCOPRESIS 03/21/2010  . DERMATITIS, ATOPIC 03/21/2010  . Diabetes mellitus type 2 in obese ([place]) 03/07/2010  . DIABETIC  RETINOPATHY 03/07/2010  . OTHER HYPERPARATHYROIDISM 03/07/2010  . Vitamin D deficiency 03/07/2010  . Hyperlipidemia, mixed 03/07/2010  . Hypocalcemia 03/07/2010  . Iron deficiency anemia 03/07/2010  . Tremor, essential 03/07/2010  .  MITRAL REGURGITATION, 0 (MILD) 03/07/2010  . TRICUSPID REGURGITATION, MILD 03/07/2010  . ESSENTIAL HYPERTENSION, BENIGN 03/07/2010  . CARDIOMEGALY, MILD 03/07/2010  . DEGENERATIVE JOINT DISEASE, LEFT HIP 03/07/2010  . Disorder of bone and cartilage 03/07/2010  . Diarrhea 03/07/2010  . Incontinence 03/07/2010  . PERSONAL HX OF  METHICILLIN RESIST STAPH AUREUS 03/07/2010  . DYSPNEA 11/24/2007  . Obesity 02/27/2007  . Obstructive sleep apnea 02/27/2007  . Seasonal and perennial allergic rhinitis 02/27/2007  . ESOPHAGEAL REFLUX 02/27/2007  . FIBROMYALGIA 02/27/2007    Past Surgical History:  Procedure Laterality Date  . ABDOMINAL SURGERY  02-01-12   ABDOMINAL SURGERY  . APPENDECTOMY  1962  . BARIATRIC SURGERY    . BLADDER SUSPENSION     complicated by bowel nick/ clolostomy  . BLEPHAROPLASTY Bilateral   . CARPAL TUNNEL RELEASE  02-01-12   right  . CATARACT EXTRACTION Bilateral   . CERVICAL DISC SURGERY     C-7 in 2004, C-4 and C-5 in 2010  . colostomy bag     Confirm with patient. Listed under medical conditions on form dated 07/18/09.  [name] COLOSTOMY REVERSAL  02-01-12  . EYE SURGERY     b/l cataracts and b/l blepharplasty  . [place]  . GASTRIC BYPASS  2003   Patient also noted "gastric bypass resection - 2004"  . HERNIA REPAIR  2009   Two hernias and abdominal reconstruction  . ivc filter     prophyllactically- no hx DVT  . LUMBAR DISC SURGERY     L1-L3 all had surgery most recently in 2017  . LUMBAR DISC SURGERY     L3-L4, Dr. Trenton Gammon  . pannilectomy     Per medical history form dated 07/18/09.  [name] TOTAL KNEE ARTHROPLASTY  02/11/2012   Procedure: TOTAL KNEE ARTHROPLASTY;  Surgeon: Mauri Pole, MD;  Location: [facility] ORS;  Service: Orthopedics;  Laterality: Right;  . ULNAR NERVE REPAIR Left 07/18/2016   by Dr.Henry Pool  . VULVA SURGERY  02-01-12   cyst removal-pt 8 months pregnant     OB History   None      Home Medications    Prior to Admission medications   Medication Sig Start Date End Date Taking? Authorizing Provider  ALPRAZolam (XANAX) 0.25 MG tablet Take 1 tablet (0.25 mg total) by mouth daily as needed. For anxiety 08/30/16   Mosie Lukes, MD  aspirin 81 MG tablet Take 81 mg by mouth daily.    [provider]  azelastine (ASTELIN) 0.1 % nasal spray USE 1 TO 2  DOSES IN EACH NOSTRIL ONCE DAILY TO TWICE DAILY AS NEEDED 04/22/17   Young, Kasandra Knudsen, MD  benzonatate (TESSALON) 100 MG capsule Take 1 capsule (100 mg total) by mouth 3 (three) times daily as needed. 04/29/17   [name], DO  DULoxetine (CYMBALTA) 60 MG capsule TAKE 1 CAPSULE BY MOUTH ONCE DAILY 04/15/17   Mosie Lukes, MD  fluticasone [facility]) 50 MCG/ACT nasal spray 2 sprays each nostril once or twice daily as neededs 04/29/17   Wendling, Crosby Oyster, DO  gabapentin (NEURONTIN) 100 MG capsule Take 2 capsules (200 mg total) by mouth at bedtime. 12/26/16   [name], DO  hydrOXYzine (ATARAX/VISTARIL) 10 MG tablet Take 1-2 tablets (10-20 mg total) by mouth 3 (three) times daily as needed for itching. 12/26/16   [name], DO  levocetirizine (XYZAL) 5 MG tablet Take 5 mg by mouth every evening.    [provider]  NON FORMULARY CPAP- set on [employer].    [provider]  OPTIMAL-D 86761 units capsule TAKE 2 CAPSULES BY MOUTH ONCE A WEEK 06/12/17   Wendling, Crosby Oyster, DO  rosuvastatin (CRESTOR) 20 MG tablet TAKE 1 TABLET BY MOUTH ONCE DAILY 05/21/17   [name], DO  traZODone (DESYREL) 100 MG tablet TAKE 1/2 TO 1 (ONE-HALF TO ONE) TABLET BY MOUTH ONCE DAILY AS NEEDED FOR SLEEP AS DIRECTED 05/22/17   Mosie Lukes, MD  VESICARE 10 MG tablet TAKE 1 TABLET BY MOUTH ONCE DAILY 07/01/17   [name], DO    Family History Family History  Problem Relation Age of Onset  . Alcohol abuse Mother   . Other Mother        accidental med overdose  . Emphysema Mother        smoked  . Bipolar disorder Mother   . Other Father        CHF  . Diabetes Father   . Cancer Father        throat ca/ smoked  . Alcohol abuse Father   . Stroke Father   . Hypertension Brother   . Hyperlipidemia Brother   . Obesity Brother   . Heart attack Maternal Grandfather   . Heart attack Paternal Grandmother   . Heart attack Paternal  Grandfather   . Arthritis Son        psoriatic  . Arthritis Son        psoriatic  . Obesity Son     Social History Social History   Tobacco Use  . Smoking status: Never Smoker  . Smokeless tobacco: Never Used  Substance Use Topics  . Alcohol use: No    Alcohol/week: 0.0 oz    Comment: special occasions  . Drug use: No     Allergies   Hydrocodone; Myrbetriq [mirabegron]; Advil [ibuprofen]; Amoxicillin; Cephalexin; Codeine; Levofloxacin; Lyrica [pregabalin]; Meperidine hcl; Morphine and related; Oxycodone-acetaminophen; Oxycodone-acetaminophen; Sulfonamide derivatives; Adhesive [tape]; Erythromycin; and Penicillins   Review of Systems Review of Systems  Constitutional: Positive for chills and fever. Negative for appetite change, diaphoresis and fatigue.  HENT: Negative for congestion and sore throat.   Eyes: Negative for visual disturbance.  Respiratory: Negative for cough, chest tightness and shortness of breath.   Cardiovascular: Positive for leg swelling and syncope. Negative for chest pain and palpitations.  Gastrointestinal: Negative for constipation, diarrhea, nausea and vomiting.  Genitourinary: Negative for dysuria, flank pain and frequency.  Musculoskeletal: Negative for back pain, neck pain and neck stiffness.  Skin: Negative for rash and wound.  Neurological: Negative for weakness, light-headedness, numbness and headaches.  Psychiatric/Behavioral: Negative for agitation and confusion.  All other systems reviewed and are negative.    Physical Exam Updated Vital Signs BP (!) 120/48 (BP Location: Right Arm)   Pulse 74   Temp 98.1 F (36.7 C) (Oral)   Resp 20   Ht 5' (1.524 m)   Wt 89.4 kg (197 lb)   SpO2 97%   BMI 38.47 kg/m   Physical Exam  Constitutional: She is oriented to person, place, and time. She appears well-developed and well-nourished. No distress.  HENT:  Head: Normocephalic and atraumatic.  Mouth/Throat: No oropharyngeal exudate.  Eyes:  Conjunctivae are normal.  Neck: Normal range of motion. Neck supple.  Cardiovascular: Normal rate and regular rhythm.  Murmur heard. Pulmonary/Chest: Effort normal and breath sounds normal. No respiratory distress. She has no wheezes. She has no rales. She exhibits no tenderness.  Abdominal:  Soft. There is no tenderness. There is no guarding.  Musculoskeletal: She exhibits tenderness. She exhibits no edema.       Right knee: She exhibits normal range of motion, no swelling, no ecchymosis, no deformity and no erythema. Tenderness found.       Legs: Lymphadenopathy:    She has no cervical adenopathy.  Neurological: She is alert and oriented to person, place, and time. No sensory deficit. She exhibits normal muscle tone.  Skin: Skin is warm and dry. No rash noted. She is not diaphoretic. No erythema.  Psychiatric: She has a normal mood and affect.  Nursing note and vitals reviewed.    ED Treatments / Results  Labs (all labs ordered are listed, but only abnormal results are displayed) Labs Reviewed  CBC WITH DIFFERENTIAL/PLATELET - Abnormal; Notable for the following components:      Result Value   WBC 13.0 (*)    Hemoglobin 11.1 (*)    HCT 34.5 (*)    RDW 15.8 (*)    Neutro Abs 10.2 (*)    Monocytes Absolute 1.1 (*)    All other components within normal limits  COMPREHENSIVE METABOLIC PANEL - Abnormal; Notable for the following components:   Glucose, Bld 105 (*)    Calcium 8.5 (*)    Total Protein 6.4 (*)    All other components within normal limits  URINALYSIS, ROUTINE W REFLEX MICROSCOPIC - Abnormal; Notable for the following components:   Leukocytes, UA TRACE (*)    All other components within normal limits  URINALYSIS, MICROSCOPIC (REFLEX) - Abnormal; Notable for the following components:   Bacteria, UA FEW (*)    All other components within normal limits  URINE CULTURE  TROPONIN I  MAGNESIUM    EKG EKG Interpretation  Date/Time:  Wednesday July 10 2017 18:39:09  EDT Ventricular Rate:  55 PR Interval:    QRS Duration: 138 QT Interval:  501 QTC Calculation: 480 R Axis:   -32 Text Interpretation:  Sinus rhythm Ventricular trigeminy Right bundle branch block Probable left ventricular hypertrophy Baseline wander in lead(s) V2 When compared to prior, new PVC.  No STEMI Confirmed by Antony Blackbird [phone]) on 07/10/2017 6:45:23 PM   Radiology Dg Chest 2 View  Result Date: 07/10/2017 CLINICAL DATA:  Right knee surgery EXAM: CHEST - 2 VIEW COMPARISON:  02/02/2013 FINDINGS: Normal heart size. Lungs clear. No pneumothorax. No pleural effusion. IMPRESSION: No active cardiopulmonary disease. Electronically Signed   By: Marybelle Killings M.D.   On: 07/10/2017 20:10   US Venous Img Lower Unilateral Right  Result Date: 07/10/2017 CLINICAL DATA:  Right knee swelling and stiffness for 3 weeks EXAM: RIGHT LOWER EXTREMITY VENOUS DUPLEX ULTRASOUND TECHNIQUE: Doppler venous assessment of the right lower extremity deep venous system was performed, including characterization of spectral flow, compressibility, and phasicity. COMPARISON:  None. FINDINGS: There is complete compressibility of the right common femoral, femoral, and popliteal veins. Doppler analysis demonstrates respiratory phasicity and augmentation of flow with calf compression. No obvious superficial vein or calf vein thrombosis. IMPRESSION: No evidence of right lower extremity DVT. Electronically Signed   By: Marybelle Killings M.D.   On: 07/10/2017 20:04   Dg Knee Complete 4 Views Right  Result Date: 07/10/2017 CLINICAL DATA:  Remote history of right knee arthroplasty 5 years ago. Patient presents with right knee pain x1 week. Difficulty weight-bearing. EXAM: RIGHT KNEE - COMPLETE 4+ VIEW COMPARISON:  None. FINDINGS: A total right knee arthroplasty without loosening or hardware failure is identified. No fracture  is seen. No suspicious osseous lesions are noted. No significant joint effusion. Vascular calcifications are  present femoral artery. IMPRESSION: No acute osseous abnormality of the right knee. No hardware failure or fracture is identified. Electronically Signed   By: Ashley Royalty M.D.   On: 07/10/2017 20:13    Procedures Procedures (including critical care time)  Medications Ordered in ED Medications  sodium chloride 0.9 % bolus 1,000 mL (0 mLs Intravenous Stopped 07/10/17 2021)     Initial Impression / Assessment and Plan / ED Course  I have reviewed the triage vital signs and the nursing notes.  Pertinent labs & imaging results that were available during my care of the patient were reviewed by me and considered in my medical decision making (see chart for details).     [name] is a 72 y.o. female with a past medical history significant for CAD, 5 myalgia, and chronic anemia as well as IVC filter and chronic right knee pains who presents with right knee pain, fever, and syncope.  Patient reports that she "always passes out" whenever she gets other infections.  She reports that she has been having pain in her right knee but is still able to bend it normally.  She reports that it feels swollen but is not warm or red.  She reports that she had a fever of 100.5 at home today.  She says that she tried to take a shower to feel better but got lightheaded.  She reports that while trying to get to her couch she syncopized.  She reports that she did not fall to the ground but laid down and passed out.  She reports that she was out for less than 1 minute.  She reports no preceding palpitations, chest pain, or shortness of breath.  She denies recent cough, congestion, or sick contacts.  She denies any urinary symptoms including no dysuria, hematuria, hesitancy, or frequency.  She denies any rashes.  She reports that her right knee is painful and feels slightly swollen in the knee.  She reports the pain also goes towards her right calf.  She denies recent injuries.  She has never had an infection in her joint.   She reports that she has a prior right knee replacement.  Next  On exam, patient appears well.  She had clear lungs.  Patient had a murmur.  Patient's abdomen was nontender.  Patient was afebrile on my evaluation.  Patient was not tachycardic.  Patient felt slightly lightheaded still and think she may be dehydrated.  She had tenderness in the lateral aspect of her right knee but had normal knee range of motion.  No significant pain when bending her knee.  No tenderness in the calf and no significant swelling was seen.  Normal pulses and sensation distally.  Normal capillary refill of the toes.  Exam otherwise unremarkable.  Given the patient's fever, patient had work-up including chest x-ray and urinalysis to look for occult infection.  Patient also had screening laboratory testing.  X-rays were obtained of the knee and a DVT ultrasound was performed.  Next  Imaging studies were reassuring with no evidence of pneumonia, abnormality with the knee, or DVT.  Urinalysis showed leukocytes and bacteria however patient is adamant that she does not have urinary symptoms.  Patient is not interested in taking antibiotics for possible UTI.  I have a low suspicion patient has a septic joint given the normal range of motion and lack of pain with any movement.  I am concerned that the risk for infecting a prosthetic joint with aspiration when I have such a low suspicion for infection outweighs the benefits.  I suspect patient may have a viral infection causing her symptoms.  Patient agreed with plan of care including withholding aspiration and discharge home.  Patient will follow-up with her PCP in several days.  Other laboratory testing is reassuring.  Patient reports that she think she passed out when she had a fever from a virus.  She agrees to stay hydrated and follow-up in several days.  Patient understood return precautions and was discharged in good condition with resolution of symptoms.   Final Clinical  Impressions(s) / ED Diagnoses   Final diagnoses:  Syncope, unspecified syncope type  Fever, unspecified fever cause  Chronic pain of right knee  Dehydration    ED Discharge Orders    None     Clinical Impression: 1. Syncope, unspecified syncope type   2. Fever, unspecified fever cause   3. Chronic pain of right knee   4. Dehydration     Disposition: Discharge  Condition: Good  I have discussed the results, Dx and Tx plan with the pt(& family if present). He/she/they expressed understanding and agree(s) with the plan. Discharge instructions discussed at great length. Strict return precautions discussed and pt &/or family have verbalized understanding of the instructions. No further questions at time of discharge.    Discharge Medication List as of 07/10/2017 10:57 PM      Follow Up: [name], DO [address] [place] [id number] [phone]     [facility] [address] [id number] [address] [place] [phone]       Dashia Caldeira, Gwenyth Allegra, MD 07/11/17 [phone]

## 2017-07-10 NOTE — Telephone Encounter (Signed)
Patient passed out this am was out for approx 1-2 minutes.Denies any chest pain or shortness of breath. Pt advised to go to er  And not to drive    Reason for Disposition . [1] Age > 50 years  AND [2] now alert and feels fine  Answer Assessment - Initial Assessment Questions 1. ONSET: "How long were you unconscious?" (minutes) "When did it happen?"     1-2  Minutes - Happened about 8  Hours ago   2. CONTENT: "What happened during period of unconsciousness?" (e.g., seizure activity)       Felt lightheaded  In shower exited bathroom felt and layed on carpet and passed out  No known carpet no seizure activity  3. MENTAL STATUS: "Alert and oriented now?" (oriented x 3 = name, month, location)       Was groggy after passing out for sev hours. But is better now   4. TRIGGER: "What do you think caused the fainting?" "What were you doing just before you fainted?"  (e.g., exercise, sudden standing up, prolonged standing)        Felt it coming on felt lightheaded  5. RECURRENT SYMPTOM: "Have you ever passed out before?" If so, ask: "When was the last time?" and "What happened that time?"          Sev months ago - did not get checked  6. INJURY: "Did you sustain any injury during the fall?"             No  7. CARDIAC SYMPTOMS: "Have you had any of the following symptoms: chest pain, difficulty breathing, palpitations?"      No  8. NEUROLOGIC SYMPTOMS: "Have you had any of the following symptoms: headache, numbness, vertigo, weakness?"       Slight headache dizzy and weak for several hours after the fall  9. GI SYMPTOMS: "Have you had any of the following symptoms: abdominal pain, vomiting, diarrhea, blood in stools?"        No 10. OTHER SYMPTOMS: "Do you have any other symptoms?"       Swelleng and pain of r knee started yesterday  had this prior to passing out  Pain scale of the knee is 5 -swelling is moderate pain on weight bearing  Fever 100. 5  11. PREGNANCY: "Is there any chance you are  pregnant?" "When was your last menstrual period?"        n/a  Protocols used: [facility]

## 2017-07-10 NOTE — ED Notes (Signed)
Pt is here due to syncope today.  Pt has hx of this and knew that this was coming.  Pt states that her right knee has been bothering her (not usually unusual) she is worried that it could be infected.  She states that it feels swollen and stiff.  Pt knee does not appear warn, red or painful to tough.

## 2017-07-10 NOTE — ED Triage Notes (Addendum)
Pt states she woke this am "not feeling right"-at 8am she was in shower-felt she was going to pass out-got out of the shower and passed out onto bedroom floor-pt also c/o fever this am and pain to right knee with concerns of possible infection r/t post surgery 5 years ago per pt per her PCP-pt NAD-steady gait-pale

## 2017-07-10 NOTE — Telephone Encounter (Signed)
FYI to PCP

## 2017-07-12 LAB — URINE CULTURE: Culture: NO GROWTH

## 2017-07-17 ENCOUNTER — Encounter: Payer: Self-pay | Admitting: Family Medicine

## 2017-07-17 ENCOUNTER — Ambulatory Visit (INDEPENDENT_AMBULATORY_CARE_PROVIDER_SITE_OTHER): Payer: Medicare Other | Admitting: Family Medicine

## 2017-07-17 VITALS — BP 130/72 | HR 70 | Temp 98.6°F | Ht 60.0 in | Wt 193.2 lb

## 2017-07-17 DIAGNOSIS — R159 Full incontinence of feces: Secondary | ICD-10-CM

## 2017-07-17 DIAGNOSIS — R55 Syncope and collapse: Secondary | ICD-10-CM

## 2017-07-17 NOTE — Patient Instructions (Addendum)
Call your cardiologist to let him know about the passing out.  If you do not hear anything about your referral in the next 1-2 weeks, call our office and ask for an update.  Stay well hydrated.  Let us know if you need anything.

## 2017-07-17 NOTE — Progress Notes (Signed)
Pre visit review using our clinic review tool, if applicable. No additional management support is needed unless otherwise documented below in the visit note. 

## 2017-07-17 NOTE — Progress Notes (Signed)
Chief Complaint  Patient presents with  . Follow-up    ED    Subjective: Patient is a 72 y.o. female here for ED f/u.  Evaluated in ED on 07/10/17 for syncope. This is apparently a long standing issue. Her last episode was 6-8 mo prior to this one. She has not notified me of this issue as it had not happened since we have established. She felt discombobulated after waking up. Was down approx 2-3 min and found by husband. He did not witness any seizure like activity. She did lose control of her bowels, did not bite tongue. No hx of seizures. She does follow w cardiology for other issues, has known carotid art dz and heart murmur.   ROS: Neuro: As noted in HPI  Past Medical History:  Diagnosis Date  . Allergy    rhinitis  . Anemia 02-01-12   iron absorption problem  . Arthritis 02-01-12   osteoarthritis, Back pain, spine surgeries ? retain hardware cervial and  lumbar.  . Cancer ([place]) 02-01-12   squamous cell -face  . Depression with anxiety 01/08/2011  . Esophageal reflux   . Fibromyalgia   . Heart murmur   . Hyperparathyroidism ([place]) 08/18/2014  . Knee pain, right 09/20/2011  . Motion sickness 12/24/2011  . Nausea & vomiting 12/24/2011  . Obesity   . Osteopenia 08/05/2014  . Panic attacks   . Pelvic floor dysfunction 09/02/2016  . Sleep apnea, obstructive   . Varicose veins of lower limb 10/02/2011    Objective: BP 130/72 (BP Location: Left Arm, Patient Position: Sitting, Cuff Size: Large)   Pulse 70   Temp 98.6 F (37 C) (Oral)   Ht 5' (1.524 m)   Wt 193 lb 4 oz (87.7 kg)   SpO2 96%   BMI 37.74 kg/m  General: Awake, appears stated age HEENT: MMM, EOMi Heart: RRR, +murmur Neuro: DTR's equal and symmetric; no cerebellar signs MSK: 5/5 strength throughout Lungs: CTAB, no rales, wheezes or rhonchi. No accessory muscle use Psych: Age appropriate judgment and insight, normal affect and mood  Assessment and Plan: Syncope, unspecified syncope type - Plan: Ambulatory referral  to Neurology  Incontinence of feces, unspecified fecal incontinence type - Plan: Ambulatory referral to Neurology  Orders as above. Alert cardiology team about this. Stay hydrated. Refer to neuro to r/o seizure activity. F/u prn.  The patient voiced understanding and agreement to the plan.  [place], DO 07/17/17  4:40 PM

## 2017-08-01 ENCOUNTER — Other Ambulatory Visit: Payer: Self-pay | Admitting: Family Medicine

## 2017-08-08 ENCOUNTER — Ambulatory Visit (INDEPENDENT_AMBULATORY_CARE_PROVIDER_SITE_OTHER): Payer: Medicare Other | Admitting: Family Medicine

## 2017-08-08 ENCOUNTER — Other Ambulatory Visit: Payer: Self-pay | Admitting: *Deleted

## 2017-08-08 ENCOUNTER — Encounter: Payer: Self-pay | Admitting: Family Medicine

## 2017-08-08 VITALS — BP 126/70 | HR 66 | Temp 98.5°F | Ht 60.0 in | Wt 195.5 lb

## 2017-08-08 DIAGNOSIS — H6981 Other specified disorders of Eustachian tube, right ear: Secondary | ICD-10-CM

## 2017-08-08 DIAGNOSIS — R21 Rash and other nonspecific skin eruption: Secondary | ICD-10-CM

## 2017-08-08 MED ORDER — PREDNISONE 20 MG PO TABS: 40.0000 mg | ORAL_TABLET | Freq: Every day | ORAL | 0 refills | Status: AC

## 2017-08-08 MED ORDER — METHYLPHENIDATE HCL 10 MG PO TABS: 10.0000 mg | ORAL_TABLET | ORAL | 0 refills | Status: AC | PRN

## 2017-08-08 MED ORDER — GABAPENTIN 100 MG PO CAPS: 200.0000 mg | ORAL_CAPSULE | Freq: Every day | ORAL | 1 refills | Status: DC

## 2017-08-08 NOTE — Patient Instructions (Signed)
Continue with the Flonase.  Don't put anymore medicine inside your ear.  Cancel your appointment if you are doing better.  Let us know if you need anything.

## 2017-08-08 NOTE — Progress Notes (Signed)
Pre visit review using our clinic review tool, if applicable. No additional management support is needed unless otherwise documented below in the visit note. 

## 2017-08-08 NOTE — Progress Notes (Signed)
Chief Complaint  Patient presents with  . Ear Fullness  . Rash    [name] is a 72 y.o. female here for a skin complaint.  Duration: 1 day Location: RLE Pruritic? No Painful? Yes Drainage? No New soaps/lotions/topicals/detergents? No Sick contacts? No Other associated symptoms: Burning Therapies tried thus far: Cortisone cream  R ear fullness for around 4 weeks. Hear's crackling. Denies fevers, drainage, ear/jaw pain. No URI s/s's. Takes INCS and OTC allergy meds. No trauma.   ROS:  Const: No fevers Skin: As noted in HPI  Past Medical History:  Diagnosis Date  . Allergy    rhinitis  . Anemia 02-01-12   iron absorption problem  . Arthritis 02-01-12   osteoarthritis, Back pain, spine surgeries ? retain hardware cervial and  lumbar.  . Cancer ([place]) 02-01-12   squamous cell -face  . Depression with anxiety 01/08/2011  . Esophageal reflux   . Fibromyalgia   . Heart murmur   . Hyperparathyroidism ([place]) 08/18/2014  . Knee pain, right 09/20/2011  . Motion sickness 12/24/2011  . Nausea & vomiting 12/24/2011  . Obesity   . Osteopenia 08/05/2014  . Panic attacks   . Pelvic floor dysfunction 09/02/2016  . Sleep apnea, obstructive   . Varicose veins of lower limb 10/02/2011      BP 126/70 (BP Location: Left Arm, Patient Position: Sitting, Cuff Size: Normal)   Pulse 66   Temp 98.5 F (36.9 C) (Oral)   Ht 5' (1.524 m)   Wt 195 lb 8 oz (88.7 kg)   SpO2 97%   BMI 38.18 kg/m  Gen: awake, alert, appearing stated age Lungs: No accessory muscle use Ears: No external lesions, canals patent, TM's with sclerosis, nml otherwise, no erythema or fluid, no bulging or retraction Nose: patent, no edema, no d/c Eyes: PERRLA, sclera white Mouth: MMM, no exudate or erythema Skin: See below. No drainage, erythema, does not blanch, fluctuance, excoriation Psych: Age appropriate judgment and insight   RLE   RLE  Rash - Plan: predniSONE (DELTASONE) 20 MG tablet  Dysfunction of  right eustachian tube - Plan: predniSONE (DELTASONE) 20 MG tablet  Orders as above. Steroid burst. Looks like a vasculitis. F/u prn. The patient voiced understanding and agreement to the plan.  [place], DO 08/08/17 3:17 PM

## 2017-08-09 DIAGNOSIS — H26491 Other secondary cataract, right eye: Secondary | ICD-10-CM | POA: Diagnosis not present

## 2017-08-09 DIAGNOSIS — H353131 Nonexudative age-related macular degeneration, bilateral, early dry stage: Secondary | ICD-10-CM | POA: Diagnosis not present

## 2017-08-12 ENCOUNTER — Encounter: Payer: Self-pay | Admitting: Family Medicine

## 2017-08-12 ENCOUNTER — Ambulatory Visit (INDEPENDENT_AMBULATORY_CARE_PROVIDER_SITE_OTHER): Payer: Medicare Other | Admitting: Family Medicine

## 2017-08-12 VITALS — BP 122/80 | HR 59 | Temp 98.5°F | Ht 60.0 in | Wt 198.0 lb

## 2017-08-12 DIAGNOSIS — H6981 Other specified disorders of Eustachian tube, right ear: Secondary | ICD-10-CM

## 2017-08-12 DIAGNOSIS — R21 Rash and other nonspecific skin eruption: Secondary | ICD-10-CM

## 2017-08-12 DIAGNOSIS — R55 Syncope and collapse: Secondary | ICD-10-CM | POA: Diagnosis not present

## 2017-08-12 MED ORDER — ALPRAZOLAM 0.25 MG PO TABS: 0.2500 mg | ORAL_TABLET | Freq: Every day | ORAL | 3 refills | Status: DC | PRN

## 2017-08-12 NOTE — Progress Notes (Signed)
Chief Complaint  Patient presents with  . Rash  . Ear Fullness    no better    [name] is a 72 y.o. female here for a skin complaint f.u.  Dx'd w suspected vasculitis and tx'd w steroids. Things are much better. She is apprehensive about going into sunlight.  Also dx'd with ETD. She was tx'd with steroids for above. She uses Astelin and Flonase at baseline. Reports no improvement. No new s/s's.   ROS:  Const: No fevers Skin: As noted in HPI  Past Medical History:  Diagnosis Date  . Allergy    rhinitis  . Anemia 02-01-12   iron absorption problem  . Arthritis 02-01-12   osteoarthritis, Back pain, spine surgeries ? retain hardware cervial and  lumbar.  . Cancer ([place]) 02-01-12   squamous cell -face  . Depression with anxiety 01/08/2011  . Esophageal reflux   . Fibromyalgia   . Heart murmur   . Hyperparathyroidism ([place]) 08/18/2014  . Knee pain, right 09/20/2011  . Motion sickness 12/24/2011  . Nausea & vomiting 12/24/2011  . Obesity   . Osteopenia 08/05/2014  . Panic attacks   . Pelvic floor dysfunction 09/02/2016  . Sleep apnea, obstructive   . Varicose veins of lower limb 10/02/2011    BP 122/80 (BP Location: Left Arm, Patient Position: Sitting, Cuff Size: Normal)   Pulse (!) 59   Temp 98.5 F (36.9 C) (Oral)   Ht 5' (1.524 m)   Wt 198 lb (89.8 kg)   SpO2 97%   BMI 38.67 kg/m  Gen: awake, alert, appearing stated age Ears: Neg b/l Heart: RRR, no bruits Lungs: No accessory muscle use Skin: Lesion on leg showing more hypopigmentation compared to Mon. No drainage, TTP, fluctuance, excoriation Psych: Age appropriate judgment and insight  Rash  Dysfunction of right eustachian tube  Syncope, unspecified syncope type - Plan: Ambulatory referral to Neurology  Rash appears to be improving, if it returns, f/u with derm. Cont course, f.u with ENT if no improvement over next week. May need more time. F/u w me prn at this time. Pt voiced understanding and agreement to  the plan.  [name] 10:14 AM 08/13/17

## 2017-08-12 NOTE — Progress Notes (Signed)
Pre visit review using our clinic review tool, if applicable. No additional management support is needed unless otherwise documented below in the visit note. 

## 2017-08-12 NOTE — Patient Instructions (Addendum)
Find out if you need a referral. I think you should be seen by ENT before you leave if you do not improve.   If you do not hear anything about your Neuro referral to Dr Jannifer Franklin in the next 1-2 weeks, call our office and ask for an update.  Live your life regarding your skin. If it comes back, make an appointment with your dermatologist.   Let us know if you need anything.

## 2017-08-19 DIAGNOSIS — H353131 Nonexudative age-related macular degeneration, bilateral, early dry stage: Secondary | ICD-10-CM | POA: Diagnosis not present

## 2017-08-19 DIAGNOSIS — H26492 Other secondary cataract, left eye: Secondary | ICD-10-CM | POA: Diagnosis not present

## 2017-08-21 ENCOUNTER — Other Ambulatory Visit: Payer: Self-pay | Admitting: Family Medicine

## 2017-08-21 NOTE — Telephone Encounter (Signed)
Received refill request for traZODone (DESYREL) 100 MG tablet. Last office visit, last refill.

## 2017-08-23 NOTE — Telephone Encounter (Signed)
Pt is checking on status of her request for a refill on tramadol and she is needing this filled tonight she is leaving for out of town tomorrow

## 2017-08-26 ENCOUNTER — Ambulatory Visit: Payer: Medicare Other | Admitting: *Deleted

## 2017-09-18 DIAGNOSIS — H6983 Other specified disorders of Eustachian tube, bilateral: Secondary | ICD-10-CM | POA: Diagnosis not present

## 2017-09-18 DIAGNOSIS — H6121 Impacted cerumen, right ear: Secondary | ICD-10-CM | POA: Diagnosis not present

## 2017-09-18 DIAGNOSIS — H903 Sensorineural hearing loss, bilateral: Secondary | ICD-10-CM | POA: Diagnosis not present

## 2017-10-07 ENCOUNTER — Ambulatory Visit: Payer: No Typology Code available for payment source | Admitting: Neurology

## 2017-10-09 ENCOUNTER — Telehealth: Payer: Self-pay | Admitting: Internal Medicine

## 2017-10-09 DIAGNOSIS — G4733 Obstructive sleep apnea (adult) (pediatric): Secondary | ICD-10-CM

## 2017-10-09 NOTE — Telephone Encounter (Signed)
Spoke with pt  She is requesting a new CPAP mask- dreamwear small with nasal pillows  Order sent to [facility]  Nothing further needed per pt

## 2017-10-14 ENCOUNTER — Other Ambulatory Visit: Payer: Self-pay | Admitting: Family Medicine

## 2017-10-15 NOTE — Telephone Encounter (Signed)
Please advise 

## 2017-10-17 ENCOUNTER — Telehealth: Payer: Self-pay | Admitting: Internal Medicine

## 2017-10-17 MED ORDER — FLUTICASONE PROPIONATE 50 MCG/ACT NA SUSP: NASAL | 12 refills | Status: DC

## 2017-10-17 NOTE — Telephone Encounter (Signed)
Rx Flonase # 1, 1-2 puffs each nostril once daily, ref x 5

## 2017-10-17 NOTE — Telephone Encounter (Signed)
Called and spoke to pt.  Pt is requesting Rx for flonase, as astelin was not effective.  Preferred pharmacy is [facility].    CY please advise. Thanks  Current Outpatient Medications on File Prior to Visit  Medication Sig Dispense Refill  . ALPRAZolam (XANAX) 0.25 MG tablet Take 1 tablet (0.25 mg total) by mouth daily as needed. For anxiety 30 tablet 3  . aspirin 81 MG tablet Take 81 mg by mouth daily.    [name] azelastine (ASTELIN) 0.1 % nasal spray USE 1 TO 2 DOSES IN EACH NOSTRIL ONCE DAILY TO TWICE DAILY AS NEEDED 30 mL 12  . benzonatate (TESSALON) 100 MG capsule Take 1 capsule (100 mg total) by mouth 3 (three) times daily as needed. 30 capsule 1  . DULoxetine (CYMBALTA) 60 MG capsule TAKE 1 CAPSULE BY MOUTH ONCE DAILY 90 capsule 1  . fluticasone (FLONASE) 50 MCG/ACT nasal spray 2 sprays each nostril once or twice daily as neededs 16 g 12  . gabapentin (NEURONTIN) 100 MG capsule Take 2 capsules (200 mg total) by mouth at bedtime. 180 capsule 1  . gabapentin (NEURONTIN) 100 MG capsule Take 2 capsules (200 mg total) by mouth at bedtime. 180 capsule 1  . hydrOXYzine (ATARAX/VISTARIL) 10 MG tablet Take 1-2 tablets (10-20 mg total) by mouth 3 (three) times daily as needed for itching. 30 tablet 1  . levocetirizine (XYZAL) 5 MG tablet Take 5 mg by mouth every evening.    . methylphenidate (RITALIN) 10 MG tablet Take 1 tablet (10 mg total) by mouth as needed. 60 tablet 0  . NON FORMULARY CPAP- set on [employer].    . OPTIMAL-D 72902 units capsule TAKE 2 CAPSULES BY MOUTH ONCE A WEEK 8 capsule 4  . rosuvastatin (CRESTOR) 20 MG tablet TAKE 1 TABLET BY MOUTH ONCE DAILY 90 tablet 1  . traZODone (DESYREL) 100 MG tablet TAKE 1/2 TO 1 (ONE-HALF TO ONE) TABLET BY MOUTH ONCE DAILY AS NEEDED FOR SLEEP 90 tablet 0  . VESICARE 10 MG tablet TAKE 1 TABLET BY MOUTH ONCE DAILY 90 tablet 1   No current facility-administered medications on file prior to visit.     Allergies  Allergen Reactions  . Hydrocodone Nausea  And Vomiting     nausea and vomiting and headaches  . Myrbetriq [Mirabegron] Hives    Hives and itching  . Advil [Ibuprofen] Hives and Itching  . Amoxicillin Hives  . Cephalexin      Neuropathy  . Codeine     Crazy in the head, bp drops  . Levofloxacin Itching  . Lyrica [Pregabalin] Other (See Comments)    "Makes me like a zombie. I'm awake but I'm in slow motion."  . Meperidine Hcl     B/P drops  . Morphine And Related      States" extreme migraines"  . Oxycodone-Acetaminophen Other (See Comments)    Crazy in head, drop in bp  . Oxycodone-Acetaminophen Other (See Comments)  . Sulfonamide Derivatives Nausea And Vomiting    Stomach upset  . Adhesive [Tape] Rash    Latex in adhesive tape. Please use paper tape  . Erythromycin Diarrhea and Rash    Other reaction(s): GI Intolerance  . Penicillins Nausea And Vomiting and Rash

## 2017-10-17 NOTE — Telephone Encounter (Signed)
Spoke with the pt to verify the msg  Rx for flonase refilled  Nothing further needed

## 2017-10-19 DIAGNOSIS — Z23 Encounter for immunization: Secondary | ICD-10-CM | POA: Diagnosis not present

## 2017-10-21 ENCOUNTER — Other Ambulatory Visit: Payer: Self-pay | Admitting: Family Medicine

## 2017-10-21 DIAGNOSIS — I779 Disorder of arteries and arterioles, unspecified: Secondary | ICD-10-CM

## 2017-10-21 DIAGNOSIS — I739 Peripheral vascular disease, unspecified: Principal | ICD-10-CM

## 2017-10-21 MED ORDER — ROSUVASTATIN CALCIUM 20 MG PO TABS: 20.0000 mg | ORAL_TABLET | Freq: Every day | ORAL | 1 refills | Status: DC

## 2017-11-04 DIAGNOSIS — H6983 Other specified disorders of Eustachian tube, bilateral: Secondary | ICD-10-CM | POA: Diagnosis not present

## 2017-11-04 DIAGNOSIS — H903 Sensorineural hearing loss, bilateral: Secondary | ICD-10-CM | POA: Diagnosis not present

## 2017-11-07 NOTE — Progress Notes (Deleted)
Subjective:   [name] is a 72 y.o. female who presents for Medicare Annual (Subsequent) preventive examination.  Pt traveling back and forth to [facility] where she shares a house with her son and his family. Pt retired this past august.  Review of Systems: No ROS.  Medicare Wellness Visit. Additional risk factors are reflected in the social history. Cardiac Risk Factors include: advanced age (>56men, >70 women);diabetes mellitus;dyslipidemia;hypertension;sedentary lifestyle;obesity (BMI >30kg/m2) Sleep patterns: Wears CPAP. Trazodone nightly. Sleeps well. Home Safety/Smoke Alarms: Feels safe in home. Smoke alarms in place. Lives with husband in 1 story home.  Female:      Mammo- utd  Dexa scan-  utd     CCS- last reported 2011. Due 2021     Objective:     Vitals: BP 140/84 (BP Location: Left Arm, Patient Position: Sitting, Cuff Size: Normal)   Pulse (!) 55   Ht 5' (1.524 m)   Wt 187 lb 9.6 oz (85.1 kg)   SpO2 97%   BMI 36.64 kg/m   Body mass index is 36.64 kg/m.  Advanced Directives 11/08/2017 08/23/2016 08/22/2015 09/07/2014 08/05/2014 02/11/2012 02/01/2012  Does Patient Have a Medical Advance Directive? Yes Yes Yes Yes Yes Patient has advance directive, copy not in chart Patient has advance directive, copy not in chart  Type of Advance Directive [place];Living will [place];Living will [place];Living will [place];Living will [place];Living will Living will Living will  Does patient want to make changes to medical advance directive? - - - - No - Patient declined - -  Copy of [place] in Chart? No - copy requested No - copy requested Yes - No - copy requested - -  Pre-existing out of facility DNR order (yellow form or pink MOST form) - - - - - No No    Tobacco Social History   Tobacco Use  Smoking Status Never Smoker  Smokeless Tobacco Never Used       Counseling given: Not Answered   Clinical Intake:     Pain : No/denies pain                 Past Medical History:  Diagnosis Date  . Allergy    rhinitis  . Anemia 02-01-12   iron absorption problem  . Arthritis 02-01-12   osteoarthritis, Back pain, spine surgeries ? retain hardware cervial and  lumbar.  . Cancer ([place]) 02-01-12   squamous cell -face  . Depression with anxiety 01/08/2011  . Esophageal reflux   . Fibromyalgia   . Heart murmur   . Hyperparathyroidism ([place]) 08/18/2014  . Knee pain, right 09/20/2011  . Motion sickness 12/24/2011  . Nausea & vomiting 12/24/2011  . Obesity   . Osteopenia 08/05/2014  . Panic attacks   . Pelvic floor dysfunction 09/02/2016  . Sleep apnea, obstructive   . Varicose veins of lower limb 10/02/2011   Past Surgical History:  Procedure Laterality Date  . ABDOMINAL SURGERY  02-01-12   ABDOMINAL SURGERY  . APPENDECTOMY  1962  . BARIATRIC SURGERY    . BLADDER SUSPENSION     complicated by bowel nick/ clolostomy  . BLEPHAROPLASTY Bilateral   . CARPAL TUNNEL RELEASE  02-01-12   right  . CATARACT EXTRACTION Bilateral   . CERVICAL DISC SURGERY     C-7 in 2004, C-4 and C-5 in 2010  . colostomy bag     Confirm with patient.  Listed under medical conditions on form dated 07/18/09.  [name] COLOSTOMY REVERSAL  02-01-12  . EYE SURGERY     b/l cataracts and b/l blepharplasty  . [name]  . GASTRIC BYPASS  2003   Patient also noted "gastric bypass resection - 2004"  . HERNIA REPAIR  2009   Two hernias and abdominal reconstruction  . ivc filter     prophyllactically- no hx DVT  . LUMBAR DISC SURGERY     L1-L3 all had surgery most recently in 2017  . LUMBAR DISC SURGERY     L3-L4, Dr. Trenton Gammon  . pannilectomy     Per medical history form dated 07/18/09.  [name] TOTAL KNEE ARTHROPLASTY  02/11/2012   Procedure: TOTAL KNEE ARTHROPLASTY;  Surgeon: Mauri Pole, MD;  Location: [facility];  Service: Orthopedics;  Laterality: Right;  .  ULNAR NERVE REPAIR Left 07/18/2016   by Dr.Henry Pool  . VULVA SURGERY  02-01-12   cyst removal-pt 8 months pregnant   Family History  Problem Relation Age of Onset  . Alcohol abuse Mother   . Other Mother        accidental med overdose  . Emphysema Mother        smoked  . Bipolar disorder Mother   . Other Father        CHF  . Diabetes Father   . Cancer Father        throat ca/ smoked  . Alcohol abuse Father   . Stroke Father   . Hypertension Brother   . Hyperlipidemia Brother   . Obesity Brother   . Heart attack Maternal Grandfather   . Heart attack Paternal Grandmother   . Heart attack Paternal Grandfather   . Arthritis Son        psoriatic  . Arthritis Son        psoriatic  . Obesity Son    Social History   Socioeconomic History  . Marital status: Married    Spouse name: Not on file  . Number of children: Not on file  . Years of education: Not on file  . Highest education level: Not on file  Occupational History  . Not on file  Social Needs  . Financial resource strain: Not on file  . Food insecurity:    Worry: Not on file    Inability: Not on file  . Transportation needs:    Medical: Not on file    Non-medical: Not on file  Tobacco Use  . Smoking status: Never Smoker  . Smokeless tobacco: Never Used  Substance and Sexual Activity  . Alcohol use: No    Alcohol/week: 0.0 standard drinks    Comment: special occasions  . Drug use: No  . Sexual activity: Never  Lifestyle  . Physical activity:    Days per week: Not on file    Minutes per session: Not on file  . Stress: Not on file  Relationships  . Social connections:    Talks on phone: Not on file    Gets together: Not on file    Attends religious service: Not on file    Active member of club or organization: Not on file    Attends meetings of clubs or organizations: Not on file    Relationship status: Not on file  Other Topics Concern  . Not on file  Social History Narrative  . Not on file     Outpatient Encounter Medications as of 11/08/2017  Medication Sig  .  aspirin 81 MG tablet Take 81 mg by mouth daily.  [name] azelastine (ASTELIN) 0.1 % nasal spray USE 1 TO 2 DOSES IN EACH NOSTRIL ONCE DAILY TO TWICE DAILY AS NEEDED  . benzonatate (TESSALON) 100 MG capsule Take 1 capsule (100 mg total) by mouth 3 (three) times daily as needed.  . DULoxetine (CYMBALTA) 60 MG capsule TAKE 1 CAPSULE BY MOUTH ONCE DAILY  . fluticasone (FLONASE) 50 MCG/ACT nasal spray 2 sprays each nostril once or twice daily as neededs  . gabapentin (NEURONTIN) 100 MG capsule Take 2 capsules (200 mg total) by mouth at bedtime.  . hydrOXYzine (ATARAX/VISTARIL) 10 MG tablet Take 1-2 tablets (10-20 mg total) by mouth 3 (three) times daily as needed for itching.  . levocetirizine (XYZAL) 5 MG tablet Take 5 mg by mouth every evening.  . NON FORMULARY CPAP- set on [employer].  . rosuvastatin (CRESTOR) 20 MG tablet Take 1 tablet (20 mg total) by mouth daily.  . traZODone (DESYREL) 100 MG tablet TAKE 1/2 TO 1 (ONE-HALF TO ONE) TABLET BY MOUTH ONCE DAILY AS NEEDED FOR SLEEP  . VESICARE 10 MG tablet TAKE 1 TABLET BY MOUTH ONCE DAILY  . [DISCONTINUED] gabapentin (NEURONTIN) 100 MG capsule Take 2 capsules (200 mg total) by mouth at bedtime.  . ALPRAZolam (XANAX) 0.25 MG tablet Take 1 tablet (0.25 mg total) by mouth daily as needed. For anxiety (Patient not taking: Reported on 11/08/2017)  . methylphenidate (RITALIN) 10 MG tablet Take 1 tablet (10 mg total) by mouth as needed. (Patient not taking: Reported on 11/08/2017)  . OPTIMAL-D 66440 units capsule TAKE 2 CAPSULES BY MOUTH ONCE A WEEK (Patient not taking: Reported on 11/08/2017)   No facility-administered encounter medications on file as of 11/08/2017.     Activities of Daily Living In your present state of health, do you have any difficulty performing the following activities: 11/08/2017  Hearing? N  Vision? N  Difficulty concentrating or making decisions? Y  Walking  or climbing stairs? Y  Dressing or bathing? N  Doing errands, shopping? N  Preparing Food and eating ? N  Using the Toilet? N  In the past six months, have you accidently leaked urine? Y  Comment sees Dr Percell Locus  Do you have problems with loss of bowel control? N  Managing your Medications? N  Managing your Finances? N  Housekeeping or managing your Housekeeping? N  Some recent data might be hidden    Patient Care Team: [name], DO as PCP - General (Family Medicine) Lyndal Pulley, DO as Attending Physician (Family Medicine) Deneise Lever, MD as Consulting Physician (Pulmonary Disease) [place], Jennefer Bravo, MD as Referring Physician (Dermatology) Leo Grosser Seymour Bars, MD as Consulting Physician (Obstetrics and Gynecology) Adrian Prows, MD as Consulting Physician (Cardiology) Alanda Slim Neena Rhymes, MD as Consulting Physician (Ophthalmology)    Assessment:   This is a routine wellness examination for Lynzi. Physical assessment deferred to PCP.]  Exercise Activities and Dietary recommendations Current Exercise Habits: The patient does not participate in regular exercise at present, Exercise limited by: None identified Diet (meal preparation, eat out, water intake, caffeinated beverages, dairy products, fruits and vegetables): well balanced     Goals    . Increase physical activity       Fall Risk Fall Risk  11/08/2017 08/23/2016 08/22/2015 08/05/2014  Falls in the past year? Yes Yes No No  Number falls in past yr: 1 1 - -  Follow up Education provided;Falls prevention discussed Education provided - -  Depression Screen PHQ 2/9 Scores 11/08/2017 08/23/2016 08/22/2015 08/05/2014  PHQ - 2 Score 0 0 2 0  PHQ- 9 Score - - 6 -     Cognitive Function MMSE - Mini Mental State Exam 11/08/2017 08/23/2016 08/22/2015  Orientation to time 5 5 5   Orientation to Place 5 5 5   Registration 3 3 3   Attention/ Calculation 5 5 5   Recall 3 3 3   Language- name 2  objects 2 2 2   Language- repeat 1 1 1   Language- follow 3 step command 3 3 3   Language- read & follow direction 1 1 1   Write a sentence 1 1 1   Copy design 1 1 1   Total score 30 30 30         Immunization History  Administered Date(s) Administered  . DTaP 01/23/1968  . Influenza Split 10/24/2010, 10/16/2011, 10/25/2015, 10/22/2016  . Influenza Whole 11/08/2008, 10/22/2009  . Influenza, High Dose Seasonal PF 10/23/2014, 10/19/2017  . Influenza,inj,Quad PF,6+ Mos 11/07/2012  . Pneumococcal Conjugate-13 06/08/2013  . Pneumococcal Polysaccharide-23 10/22/2009, 08/22/2015  . Tdap 10/23/2006, 11/17/2013  . Zoster 01/23/2004  . Zoster Recombinat (Shingrix) 12/26/2016, 02/28/2017    Screening Tests Health Maintenance  Topic Date Due  . OPHTHALMOLOGY EXAM  07/27/2016  . HEMOGLOBIN A1C  01/02/2018  . URINE MICROALBUMIN  07/04/2018  . MAMMOGRAM  05/01/2019  . COLONOSCOPY  09/23/2019  . TETANUS/TDAP  11/18/2023  . INFLUENZA VACCINE  Completed  . DEXA SCAN  Completed  . Hepatitis C Screening  Completed  . PNA vac Low Risk Adult  Completed  . FOOT EXAM  Discontinued       Plan:   ***   I have personally reviewed and noted the following in the patient's chart:   . Medical and social history . Use of alcohol, tobacco or illicit drugs  . Current medications and supplements . Functional ability and status . Nutritional status . Physical activity . Advanced directives . List of other physicians . Hospitalizations, surgeries, and ER visits in previous 12 months . Vitals . Screenings to include cognitive, depression, and falls . Referrals and appointments  In addition, I have reviewed and discussed with patient certain preventive protocols, quality metrics, and best practice recommendations. A written personalized care plan for preventive services as well as general preventive health recommendations were provided to patient.     Shela Nevin, [place]  11/08/2017

## 2017-11-08 ENCOUNTER — Ambulatory Visit (INDEPENDENT_AMBULATORY_CARE_PROVIDER_SITE_OTHER): Payer: Medicare Other | Admitting: *Deleted

## 2017-11-08 ENCOUNTER — Ambulatory Visit: Payer: Medicare Other | Admitting: *Deleted

## 2017-11-08 ENCOUNTER — Encounter: Payer: Self-pay | Admitting: *Deleted

## 2017-11-08 VITALS — BP 140/84 | HR 55 | Ht 60.0 in | Wt 187.6 lb

## 2017-11-08 DIAGNOSIS — Z0000 Encounter for general adult medical examination without abnormal findings: Secondary | ICD-10-CM

## 2017-11-08 NOTE — Progress Notes (Signed)
Subjective:   [name] is a 72 y.o. female who presents for Medicare Annual (Subsequent) preventive examination.  Pt traveling back and forth to [facility] where she shares a house with her son and his family. Pt retired this past august.  Review of Systems: No ROS.  Medicare Wellness Visit. Additional risk factors are reflected in the social history. Cardiac Risk Factors include: advanced age (>50men, >22 women);diabetes mellitus;dyslipidemia;hypertension;sedentary lifestyle;obesity (BMI >30kg/m2) Sleep patterns: Wears CPAP. Trazodone nightly. Sleeps well. Home Safety/Smoke Alarms: Feels safe in home. Smoke alarms in place. Lives with husband in 1 story home.  Female:      Mammo- utd  Dexa scan-  utd     CCS- last reported 2011. Due 2021     Objective:     Vitals: BP 140/84 (BP Location: Left Arm, Patient Position: Sitting, Cuff Size: Normal)   Pulse (!) 55   Ht 5' (1.524 m)   Wt 187 lb 9.6 oz (85.1 kg)   SpO2 97%   BMI 36.64 kg/m   Body mass index is 36.64 kg/m.  Advanced Directives 11/08/2017 08/23/2016 08/22/2015 09/07/2014 08/05/2014 02/11/2012 02/01/2012  Does Patient Have a Medical Advance Directive? Yes Yes Yes Yes Yes Patient has advance directive, copy not in chart Patient has advance directive, copy not in chart  Type of Advance Directive [place];Living will [place];Living will [place];Living will [place];Living will [place];Living will Living will Living will  Does patient want to make changes to medical advance directive? - - - - No - Patient declined - -  Copy of [place] in Chart? No - copy requested No - copy requested Yes - No - copy requested - -  Pre-existing out of facility DNR order (yellow form or pink MOST form) - - - - - No No    Tobacco Social History   Tobacco Use  Smoking Status Never Smoker  Smokeless Tobacco Never Used       Counseling given: Not Answered   Clinical Intake Pain : No/denies pain  Past Medical History:  Diagnosis Date  . Allergy    rhinitis  . Anemia 02-01-12   iron absorption problem  . Arthritis 02-01-12   osteoarthritis, Back pain, spine surgeries ? retain hardware cervial and  lumbar.  . Cancer ([place]) 02-01-12   squamous cell -face  . Depression with anxiety 01/08/2011  . Esophageal reflux   . Fibromyalgia   . Heart murmur   . Hyperparathyroidism ([place]) 08/18/2014  . Knee pain, right 09/20/2011  . Motion sickness 12/24/2011  . Nausea & vomiting 12/24/2011  . Obesity   . Osteopenia 08/05/2014  . Panic attacks   . Pelvic floor dysfunction 09/02/2016  . Sleep apnea, obstructive   . Varicose veins of lower limb 10/02/2011   Past Surgical History:  Procedure Laterality Date  . ABDOMINAL SURGERY  02-01-12   ABDOMINAL SURGERY  . APPENDECTOMY  1962  . BARIATRIC SURGERY    . BLADDER SUSPENSION     complicated by bowel nick/ clolostomy  . BLEPHAROPLASTY Bilateral   . CARPAL TUNNEL RELEASE  02-01-12   right  . CATARACT EXTRACTION Bilateral   . CERVICAL DISC SURGERY     C-7 in 2004, C-4 and C-5 in 2010  . colostomy bag     Confirm with patient. Listed under medical conditions on form dated 07/18/09.  [name] COLOSTOMY REVERSAL  02-01-12  . EYE SURGERY  b/l cataracts and b/l blepharplasty  . [place]  . GASTRIC BYPASS  2003   Patient also noted "gastric bypass resection - 2004"  . HERNIA REPAIR  2009   Two hernias and abdominal reconstruction  . ivc filter     prophyllactically- no hx DVT  . LUMBAR DISC SURGERY     L1-L3 all had surgery most recently in 2017  . LUMBAR DISC SURGERY     L3-L4, Dr. Trenton Gammon  . pannilectomy     Per medical history form dated 07/18/09.  [name] TOTAL KNEE ARTHROPLASTY  02/11/2012   Procedure: TOTAL KNEE ARTHROPLASTY;  Surgeon: Mauri Pole, MD;  Location: [facility]S;  Service: Orthopedics;  Laterality: Right;  . ULNAR NERVE REPAIR Left 07/18/2016    by Dr.Henry Pool  . VULVA SURGERY  02-01-12   cyst removal-pt 8 months pregnant   Family History  Problem Relation Age of Onset  . Alcohol abuse Mother   . Other Mother        accidental med overdose  . Emphysema Mother        smoked  . Bipolar disorder Mother   . Other Father        CHF  . Diabetes Father   . Cancer Father        throat ca/ smoked  . Alcohol abuse Father   . Stroke Father   . Hypertension Brother   . Hyperlipidemia Brother   . Obesity Brother   . Heart attack Maternal Grandfather   . Heart attack Paternal Grandmother   . Heart attack Paternal Grandfather   . Arthritis Son        psoriatic  . Arthritis Son        psoriatic  . Obesity Son    Social History   Socioeconomic History  . Marital status: Married    Spouse name: Not on file  . Number of children: Not on file  . Years of education: Not on file  . Highest education level: Not on file  Occupational History  . Not on file  Social Needs  . Financial resource strain: Not on file  . Food insecurity:    Worry: Not on file    Inability: Not on file  . Transportation needs:    Medical: Not on file    Non-medical: Not on file  Tobacco Use  . Smoking status: Never Smoker  . Smokeless tobacco: Never Used  Substance and Sexual Activity  . Alcohol use: No    Alcohol/week: 0.0 standard drinks    Comment: special occasions  . Drug use: No  . Sexual activity: Never  Lifestyle  . Physical activity:    Days per week: Not on file    Minutes per session: Not on file  . Stress: Not on file  Relationships  . Social connections:    Talks on phone: Not on file    Gets together: Not on file    Attends religious service: Not on file    Active member of club or organization: Not on file    Attends meetings of clubs or organizations: Not on file    Relationship status: Not on file  Other Topics Concern  . Not on file  Social History Narrative  . Not on file    Outpatient Encounter Medications  as of 11/08/2017  Medication Sig  . aspirin 81 MG tablet Take 81 mg by mouth daily.  [name] azelastine (ASTELIN) 0.1 % nasal spray USE 1 TO  2 DOSES IN EACH NOSTRIL ONCE DAILY TO TWICE DAILY AS NEEDED  . benzonatate (TESSALON) 100 MG capsule Take 1 capsule (100 mg total) by mouth 3 (three) times daily as needed.  . DULoxetine (CYMBALTA) 60 MG capsule TAKE 1 CAPSULE BY MOUTH ONCE DAILY  . fluticasone (FLONASE) 50 MCG/ACT nasal spray 2 sprays each nostril once or twice daily as neededs  . gabapentin (NEURONTIN) 100 MG capsule Take 2 capsules (200 mg total) by mouth at bedtime.  . hydrOXYzine (ATARAX/VISTARIL) 10 MG tablet Take 1-2 tablets (10-20 mg total) by mouth 3 (three) times daily as needed for itching.  . levocetirizine (XYZAL) 5 MG tablet Take 5 mg by mouth every evening.  . NON FORMULARY CPAP- set on [employer].  . rosuvastatin (CRESTOR) 20 MG tablet Take 1 tablet (20 mg total) by mouth daily.  . traZODone (DESYREL) 100 MG tablet TAKE 1/2 TO 1 (ONE-HALF TO ONE) TABLET BY MOUTH ONCE DAILY AS NEEDED FOR SLEEP  . VESICARE 10 MG tablet TAKE 1 TABLET BY MOUTH ONCE DAILY  . [DISCONTINUED] gabapentin (NEURONTIN) 100 MG capsule Take 2 capsules (200 mg total) by mouth at bedtime.  . ALPRAZolam (XANAX) 0.25 MG tablet Take 1 tablet (0.25 mg total) by mouth daily as needed. For anxiety (Patient not taking: Reported on 11/08/2017)  . methylphenidate (RITALIN) 10 MG tablet Take 1 tablet (10 mg total) by mouth as needed. (Patient not taking: Reported on 11/08/2017)  . OPTIMAL-D 86767 units capsule TAKE 2 CAPSULES BY MOUTH ONCE A WEEK (Patient not taking: Reported on 11/08/2017)   No facility-administered encounter medications on file as of 11/08/2017.     Activities of Daily Living In your present state of health, do you have any difficulty performing the following activities: 11/08/2017  Hearing? N  Vision? N  Difficulty concentrating or making decisions? Y  Walking or climbing stairs? Y  Dressing or  bathing? N  Doing errands, shopping? N  Preparing Food and eating ? N  Using the Toilet? N  In the past six months, have you accidently leaked urine? Y  Comment sees Dr Percell Locus  Do you have problems with loss of bowel control? N  Managing your Medications? N  Managing your Finances? N  Housekeeping or managing your Housekeeping? N  Some recent data might be hidden    Patient Care Team: [name], DO as PCP - General (Family Medicine) Lyndal Pulley, DO as Attending Physician (Family Medicine) Deneise Lever, MD as Consulting Physician (Pulmonary Disease) [place], Jennefer Bravo, MD as Referring Physician (Dermatology) Leo Grosser Seymour Bars, MD as Consulting Physician (Obstetrics and Gynecology) Adrian Prows, MD as Consulting Physician (Cardiology) Alanda Slim Neena Rhymes, MD as Consulting Physician (Ophthalmology)    Assessment:   This is a routine wellness examination for Chidera. Physical assessment deferred to PCP.]  Exercise Activities and Dietary recommendations Current Exercise Habits: The patient does not participate in regular exercise at present, Exercise limited by: None identified   Diet (meal preparation, eat out, water intake, caffeinated beverages, dairy products, fruits and vegetables): in general, a "healthy" diet  , well balanced     Goals    . Increase physical activity       Fall Risk Fall Risk  11/08/2017 08/23/2016 08/22/2015 08/05/2014  Falls in the past year? Yes Yes No No  Number falls in past yr: 1 1 - -  Follow up Education provided;Falls prevention discussed Education provided - -    Depression Screen PHQ 2/9 Scores 11/08/2017 08/23/2016  08/22/2015 08/05/2014  PHQ - 2 Score 0 0 2 0  PHQ- 9 Score - - 6 -     Cognitive Function MMSE - Mini Mental State Exam 11/08/2017 08/23/2016 08/22/2015  Orientation to time 5 5 5   Orientation to Place 5 5 5   Registration 3 3 3   Attention/ Calculation 5 5 5   Recall 3 3 3   Language- name 2  objects 2 2 2   Language- repeat 1 1 1   Language- follow 3 step command 3 3 3   Language- read & follow direction 1 1 1   Write a sentence 1 1 1   Copy design 1 1 1   Total score 30 30 30         Immunization History  Administered Date(s) Administered  . DTaP 01/23/1968  . Influenza Split 10/24/2010, 10/16/2011, 10/25/2015, 10/22/2016  . Influenza Whole 11/08/2008, 10/22/2009  . Influenza, High Dose Seasonal PF 10/23/2014, 10/19/2017  . Influenza,inj,Quad PF,6+ Mos 11/07/2012  . Pneumococcal Conjugate-13 06/08/2013  . Pneumococcal Polysaccharide-23 10/22/2009, 08/22/2015  . Tdap 10/23/2006, 11/17/2013  . Zoster 01/23/2004  . Zoster Recombinat (Shingrix) 12/26/2016, 02/28/2017    Screening Tests Health Maintenance  Topic Date Due  . FOOT EXAM  02/12/1955  . OPHTHALMOLOGY EXAM  07/27/2016  . HEMOGLOBIN A1C  01/02/2018  . URINE MICROALBUMIN  07/04/2018  . MAMMOGRAM  05/01/2019  . COLONOSCOPY  09/23/2019  . TETANUS/TDAP  11/18/2023  . INFLUENZA VACCINE  Completed  . DEXA SCAN  Completed  . Hepatitis C Screening  Completed  . PNA vac Low Risk Adult  Completed       Plan:    Please schedule your next medicare wellness visit with me in 1 yr.  Schedule 6 month follow up with PCP in December.  Continue to eat heart healthy diet (full of fruits, vegetables, whole grains, lean protein, water--limit salt, fat, and sugar intake) and increase physical activity as tolerated.  Bring a copy of your living will and/or healthcare power of attorney to your next office visit.    I have personally reviewed and noted the following in the patient's chart:   . Medical and social history . Use of alcohol, tobacco or illicit drugs  . Current medications and supplements . Functional ability and status . Nutritional status . Physical activity . Advanced directives . List of other physicians . Hospitalizations, surgeries, and ER visits in previous 12 months . Vitals . Screenings to  include cognitive, depression, and falls . Referrals and appointments  In addition, I have reviewed and discussed with patient certain preventive protocols, quality metrics, and best practice recommendations. A written personalized care plan for preventive services as well as general preventive health recommendations were provided to patient.     Shela Nevin, [place]  11/08/2017

## 2017-11-08 NOTE — Patient Instructions (Signed)
Please schedule your next medicare wellness visit with me in 1 yr.  Schedule 6 month follow up with PCP in December.  Continue to eat heart healthy diet (full of fruits, vegetables, whole grains, lean protein, water--limit salt, fat, and sugar intake) and increase physical activity as tolerated.  Bring a copy of your living will and/or healthcare power of attorney to your next office visit.   [name] , Thank you for taking time to come for your Medicare Wellness Visit. I appreciate your ongoing commitment to your health goals. Please review the following plan we discussed and let me know if I can assist you in the future.   These are the goals we discussed: Goals    . Increase physical activity       This is a list of the screening recommended for you and due dates:  Health Maintenance  Topic Date Due  . Complete foot exam   02/12/1955  . Eye exam for diabetics  07/27/2016  . Hemoglobin A1C  01/02/2018  . Urine Protein Check  07/04/2018  . Mammogram  05/01/2019  . Colon Cancer Screening  09/23/2019  . Tetanus Vaccine  11/18/2023  . Flu Shot  Completed  . DEXA scan (bone density measurement)  Completed  .  Hepatitis C: One time screening is recommended by Center for Disease Control  (CDC) for  adults born from [birth year] through 1963.   Completed  . Pneumonia vaccines  Completed    Health Maintenance for Postmenopausal Women Menopause is a normal process in which your reproductive ability comes to an end. This process happens gradually over a span of months to years, usually between the ages of 74 and 61. Menopause is complete when you have missed 12 consecutive menstrual periods. It is important to talk with your health care provider about some of the most common conditions that affect postmenopausal women, such as heart disease, cancer, and bone loss (osteoporosis). Adopting a healthy lifestyle and getting preventive care can help to promote your health and wellness. Those actions  can also lower your chances of developing some of these common conditions. What should I know about menopause? During menopause, you may experience a number of symptoms, such as:  Moderate-to-severe hot flashes.  Night sweats.  Decrease in sex drive.  Mood swings.  Headaches.  Tiredness.  Irritability.  Memory problems.  Insomnia.  Choosing to treat or not to treat menopausal changes is an individual decision that you make with your health care provider. What should I know about hormone replacement therapy and supplements? Hormone therapy products are effective for treating symptoms that are associated with menopause, such as hot flashes and night sweats. Hormone replacement carries certain risks, especially as you become older. If you are thinking about using estrogen or estrogen with progestin treatments, discuss the benefits and risks with your health care provider. What should I know about heart disease and stroke? Heart disease, heart attack, and stroke become more likely as you age. This may be due, in part, to the hormonal changes that your body experiences during menopause. These can affect how your body processes dietary fats, triglycerides, and cholesterol. Heart attack and stroke are both medical emergencies. There are many things that you can do to help prevent heart disease and stroke:  Have your blood pressure checked at least every 1-2 years. High blood pressure causes heart disease and increases the risk of stroke.  If you are 45-9 years old, ask your health care provider if you should  take aspirin to prevent a heart attack or a stroke.  Do not use any tobacco products, including cigarettes, chewing tobacco, or electronic cigarettes. If you need help quitting, ask your health care provider.  It is important to eat a healthy diet and maintain a healthy weight. ? Be sure to include plenty of vegetables, fruits, low-fat dairy products, and lean protein. ? Avoid  eating foods that are high in solid fats, added sugars, or salt (sodium).  Get regular exercise. This is one of the most important things that you can do for your health. ? Try to exercise for at least 150 minutes each week. The type of exercise that you do should increase your heart rate and make you sweat. This is known as moderate-intensity exercise. ? Try to do strengthening exercises at least twice each week. Do these in addition to the moderate-intensity exercise.  Know your numbers.Ask your health care provider to check your cholesterol and your blood glucose. Continue to have your blood tested as directed by your health care provider.  What should I know about cancer screening? There are several types of cancer. Take the following steps to reduce your risk and to catch any cancer development as early as possible. Breast Cancer  Practice breast self-awareness. ? This means understanding how your breasts normally appear and feel. ? It also means doing regular breast self-exams. Let your health care provider know about any changes, no matter how small.  If you are 62 or older, have a clinician do a breast exam (clinical breast exam or CBE) every year. Depending on your age, family history, and medical history, it may be recommended that you also have a yearly breast X-ray (mammogram).  If you have a family history of breast cancer, talk with your health care provider about genetic screening.  If you are at high risk for breast cancer, talk with your health care provider about having an MRI and a mammogram every year.  Breast cancer (BRCA) gene test is recommended for women who have family members with BRCA-related cancers. Results of the assessment will determine the need for genetic counseling and BRCA1 and for BRCA2 testing. BRCA-related cancers include these types: ? Breast. This occurs in males or females. ? Ovarian. ? Tubal. This may also be called fallopian tube cancer. ? Cancer  of the abdominal or pelvic lining (peritoneal cancer). ? Prostate. ? Pancreatic.  Cervical, Uterine, and Ovarian Cancer Your health care provider may recommend that you be screened regularly for cancer of the pelvic organs. These include your ovaries, uterus, and vagina. This screening involves a pelvic exam, which includes checking for microscopic changes to the surface of your cervix (Pap test).  For women ages 21-65, health care providers may recommend a pelvic exam and a Pap test every three years. For women ages 38-65, they may recommend the Pap test and pelvic exam, combined with testing for human papilloma virus (HPV), every five years. Some types of HPV increase your risk of cervical cancer. Testing for HPV may also be done on women of any age who have unclear Pap test results.  Other health care providers may not recommend any screening for nonpregnant women who are considered low risk for pelvic cancer and have no symptoms. Ask your health care provider if a screening pelvic exam is right for you.  If you have had past treatment for cervical cancer or a condition that could lead to cancer, you need Pap tests and screening for cancer for at least  20 years after your treatment. If Pap tests have been discontinued for you, your risk factors (such as having a new sexual partner) need to be reassessed to determine if you should start having screenings again. Some women have medical problems that increase the chance of getting cervical cancer. In these cases, your health care provider may recommend that you have screening and Pap tests more often.  If you have a family history of uterine cancer or ovarian cancer, talk with your health care provider about genetic screening.  If you have vaginal bleeding after reaching menopause, tell your health care provider.  There are currently no reliable tests available to screen for ovarian cancer.  Lung Cancer Lung cancer screening is recommended for  adults 16-84 years old who are at high risk for lung cancer because of a history of smoking. A yearly low-dose CT scan of the lungs is recommended if you:  Currently smoke.  Have a history of at least 30 pack-years of smoking and you currently smoke or have quit within the past 15 years. A pack-year is smoking an average of one pack of cigarettes per day for one year.  Yearly screening should:  Continue until it has been 15 years since you quit.  Stop if you develop a health problem that would prevent you from having lung cancer treatment.  Colorectal Cancer  This type of cancer can be detected and can often be prevented.  Routine colorectal cancer screening usually begins at age 31 and continues through age 25.  If you have risk factors for colon cancer, your health care provider may recommend that you be screened at an earlier age.  If you have a family history of colorectal cancer, talk with your health care provider about genetic screening.  Your health care provider may also recommend using home test kits to check for hidden blood in your stool.  A small camera at the end of a tube can be used to examine your colon directly (sigmoidoscopy or colonoscopy). This is done to check for the earliest forms of colorectal cancer.  Direct examination of the colon should be repeated every 5-10 years until age 35. However, if early forms of precancerous polyps or small growths are found or if you have a family history or genetic risk for colorectal cancer, you may need to be screened more often.  Skin Cancer  Check your skin from head to toe regularly.  Monitor any moles. Be sure to tell your health care provider: ? About any new moles or changes in moles, especially if there is a change in a mole's shape or color. ? If you have a mole that is larger than the size of a pencil eraser.  If any of your family members has a history of skin cancer, especially at a young age, talk with your  health care provider about genetic screening.  Always use sunscreen. Apply sunscreen liberally and repeatedly throughout the day.  Whenever you are outside, protect yourself by wearing long sleeves, pants, a wide-brimmed hat, and sunglasses.  What should I know about osteoporosis? Osteoporosis is a condition in which bone destruction happens more quickly than new bone creation. After menopause, you may be at an increased risk for osteoporosis. To help prevent osteoporosis or the bone fractures that can happen because of osteoporosis, the following is recommended:  If you are 21-60 years old, get at least 1,000 mg of calcium and at least 600 mg of vitamin D per day.  If you  are older than age 89 but younger than age 33, get at least 1,200 mg of calcium and at least 600 mg of vitamin D per day.  If you are older than age 85, get at least 1,200 mg of calcium and at least 800 mg of vitamin D per day.  Smoking and excessive alcohol intake increase the risk of osteoporosis. Eat foods that are rich in calcium and vitamin D, and do weight-bearing exercises several times each week as directed by your health care provider. What should I know about how menopause affects my mental health? Depression may occur at any age, but it is more common as you become older. Common symptoms of depression include:  Low or sad mood.  Changes in sleep patterns.  Changes in appetite or eating patterns.  Feeling an overall lack of motivation or enjoyment of activities that you previously enjoyed.  Frequent crying spells.  Talk with your health care provider if you think that you are experiencing depression. What should I know about immunizations? It is important that you get and maintain your immunizations. These include:  Tetanus, diphtheria, and pertussis (Tdap) booster vaccine.  Influenza every year before the flu season begins.  Pneumonia vaccine.  Shingles vaccine.  Your health care provider may  also recommend other immunizations. This information is not intended to replace advice given to you by your health care provider. Make sure you discuss any questions you have with your health care provider. Document Released: 03/02/2005 Document Revised: 07/29/2015 Document Reviewed: 10/12/2014 Elsevier Interactive Patient Education  2018 [employer].

## 2017-11-08 NOTE — Progress Notes (Signed)
Noted. Agree with above.  [place], DO 11/08/17 1:31 PM

## 2017-11-14 ENCOUNTER — Ambulatory Visit: Payer: Self-pay

## 2017-11-14 NOTE — Telephone Encounter (Signed)
Pt called for the beach stating that she has felt dizzy and woozy since Friday. Its making her feel nauseated.  She has recently been to her ear doctor and has finished a prednisone Rx form him. Pt is concerned because her BP is slightly elevated 150/60's had her HR is running low last check HR55. Pt states she does not take any BP medications. She admits that she doesn't drink much and is usually dehydrated. In the summer she had dizziness which caused her to pass out. Pt states she doesn't know what was decided as the cause. Per protocol pt was told she needed to seek medical attention today. She was advised to be seen at urgent care or an ED since she is out of town. Pt stated she would drive herself home for appointment. Pt was told not to drive until cleared by MD. Appointment was made for Monday 11/18/17 per pt request. Pt was again urged to be seen today. Pt verbalized understanding.  Care advice read to patient.  Pt verbalized understanding of all instructions.  Reason for Disposition . [1] MODERATE dizziness (e.g., interferes with normal activities) AND [2] has NOT been evaluated by physician for this  (Exception: dizziness caused by heat exposure, sudden standing, or poor fluid intake)  Answer Assessment - Initial Assessment Questions 1. DESCRIPTION: "Describe your dizziness."     Light headed 2. LIGHTHEADED: "Do you feel lightheaded?" (e.g., somewhat faint, woozy, weak upon standing)     Woozy sick like a tingling feeling 3. VERTIGO: "Do you feel like either you or the room is spinning or tilting?" (i.e. vertigo)     no 4. SEVERITY: "How bad is it?"  "Do you feel like you are going to faint?" "Can you stand and walk?"   - MILD - walking normally   - MODERATE - interferes with normal activities (e.g., work, school)    - SEVERE - unable to stand, requires support to walk, feels like passing out now.      moderate 5. ONSET:  "When did the dizziness begin?"     Friday BP was up dizziness 6.  AGGRAVATING FACTORS: "Does anything make it worse?" (e.g., standing, change in head position)     Changing head position 7. HEART RATE: "Can you tell me your heart rate?" "How many beats in 15 seconds?"  (Note: not all patients can do this)       50's 8. CAUSE: "What do you think is causing the dizziness?"     Ear dr 1 week ago on prdnisone 9. RECURRENT SYMPTOM: "Have you had dizziness before?" If so, ask: "When was the last time?" "What happened that time?"     Yes would pass out past summer 10. OTHER SYMPTOMS: "Do you have any other symptoms?" (e.g., fever, chest pain, vomiting, diarrhea, bleeding)       nausea 11. PREGNANCY: "Is there any chance you are pregnant?" "When was your last menstrual period?"       N/A  Protocols used: DIZZINESS [facility]

## 2017-11-15 DIAGNOSIS — R531 Weakness: Secondary | ICD-10-CM | POA: Diagnosis not present

## 2017-11-15 DIAGNOSIS — I4510 Unspecified right bundle-branch block: Secondary | ICD-10-CM | POA: Diagnosis not present

## 2017-11-15 DIAGNOSIS — I10 Essential (primary) hypertension: Secondary | ICD-10-CM | POA: Diagnosis not present

## 2017-11-15 DIAGNOSIS — I517 Cardiomegaly: Secondary | ICD-10-CM | POA: Diagnosis not present

## 2017-11-15 DIAGNOSIS — R5381 Other malaise: Secondary | ICD-10-CM | POA: Diagnosis not present

## 2017-11-15 DIAGNOSIS — R42 Dizziness and giddiness: Secondary | ICD-10-CM | POA: Diagnosis not present

## 2017-11-15 DIAGNOSIS — Z6836 Body mass index (BMI) 36.0-36.9, adult: Secondary | ICD-10-CM | POA: Diagnosis not present

## 2017-11-15 DIAGNOSIS — R079 Chest pain, unspecified: Secondary | ICD-10-CM | POA: Diagnosis not present

## 2017-11-15 DIAGNOSIS — R011 Cardiac murmur, unspecified: Secondary | ICD-10-CM | POA: Diagnosis not present

## 2017-11-15 DIAGNOSIS — R5383 Other fatigue: Secondary | ICD-10-CM | POA: Diagnosis not present

## 2017-11-15 DIAGNOSIS — R001 Bradycardia, unspecified: Secondary | ICD-10-CM | POA: Diagnosis not present

## 2017-11-18 ENCOUNTER — Ambulatory Visit: Payer: Medicare Other | Admitting: Family Medicine

## 2017-11-20 ENCOUNTER — Ambulatory Visit (INDEPENDENT_AMBULATORY_CARE_PROVIDER_SITE_OTHER): Payer: Medicare Other | Admitting: Family Medicine

## 2017-11-20 ENCOUNTER — Encounter: Payer: Self-pay | Admitting: Family Medicine

## 2017-11-20 VITALS — BP 132/80 | HR 62 | Temp 98.0°F | Ht 60.0 in | Wt 189.0 lb

## 2017-11-20 DIAGNOSIS — H8110 Benign paroxysmal vertigo, unspecified ear: Secondary | ICD-10-CM | POA: Diagnosis not present

## 2017-11-20 NOTE — Progress Notes (Signed)
Pre visit review using our clinic review tool, if applicable. No additional management support is needed unless otherwise documented below in the visit note. 

## 2017-11-20 NOTE — Patient Instructions (Addendum)
Stay hydrated.  Send me a message Monday if no better.  How to Perform the Epley Maneuver The Epley maneuver is an exercise that relieves symptoms of vertigo. Vertigo is the feeling that you or your surroundings are moving when they are not. When you feel vertigo, you may feel like the room is spinning and have trouble walking. Dizziness is a little different than vertigo. When you are dizzy, you may feel unsteady or light-headed. You can do this maneuver at home whenever you have symptoms of vertigo. You can do it up to 3 times a day until your symptoms go away. Even though the Epley maneuver may relieve your vertigo for a few weeks, it is possible that your symptoms will return. This maneuver relieves vertigo, but it does not relieve dizziness. What are the risks? If it is done correctly, the Epley maneuver is considered safe. Sometimes it can lead to dizziness or nausea that goes away after a short time. If you develop other symptoms, such as changes in vision, weakness, or numbness, stop doing the maneuver and call your health care provider. How to perform the Epley maneuver 1. Sit on the edge of a bed or table with your back straight and your legs extended or hanging over the edge of the bed or table. 2. Turn your head halfway toward the affected ear or side. 3. Lie backward quickly with your head turned until you are lying flat on your back. You may want to position a pillow under your shoulders. 4. Hold this position for 30 seconds. You may experience an attack of vertigo. This is normal. 5. Turn your head to the opposite direction until your unaffected ear is facing the floor. 6. Hold this position for 30 seconds. You may experience an attack of vertigo. This is normal. Hold this position until the vertigo stops. 7. Turn your whole body to the same side as your head. Hold for another 30 seconds. 8. Sit back up. You can repeat this exercise up to 3 times a day. Follow these instructions at  home:  After doing the Epley maneuver, you can return to your normal activities.  Ask your health care provider if there is anything you should do at home to prevent vertigo. He or she may recommend that you: ? Keep your head raised (elevated) with two or more pillows while you sleep. ? Do not sleep on the side of your affected ear. ? Get up slowly from bed. ? Avoid sudden movements during the day. ? Avoid extreme head movement, like looking up or bending over. Contact a health care provider if:  Your vertigo gets worse.  You have other symptoms, including: ? Nausea. ? Vomiting. ? Headache. Get help right away if:  You have vision changes.  You have a severe or worsening headache or neck pain.  You cannot stop vomiting.  You have new numbness or weakness in any part of your body. Summary  Vertigo is the feeling that you or your surroundings are moving when they are not.  The Epley maneuver is an exercise that relieves symptoms of vertigo.  If the Epley maneuver is done correctly, it is considered safe. You can do it up to 3 times a day. This information is not intended to replace advice given to you by your health care provider. Make sure you discuss any questions you have with your health care provider. Document Released: 01/13/2013 Document Revised: 11/29/2015 Document Reviewed: 11/29/2015 Elsevier Interactive Patient Education  2017 Elsevier  Inc.  

## 2017-11-20 NOTE — Progress Notes (Signed)
Chief Complaint  Patient presents with  . Hospitalization Follow-up    [name] is 72 y.o. pt here for dizziness.  Duration: 2 weeks Pass out? No Spinning? No Recent illness/fever? No Headache? Yes - no recent hx of migraines (>20 yrs ago) Neurologic signs? No Change in PO intake? No Started on Pred from ENT, not sure if this contributed.   ROS:  Neuro: As noted in HPI Eyes: No vision changes  Past Medical History:  Diagnosis Date  . Allergy    rhinitis  . Anemia 02-01-12   iron absorption problem  . Arthritis 02-01-12   osteoarthritis, Back pain, spine surgeries ? retain hardware cervial and  lumbar.  . Cancer ([place]) 02-01-12   squamous cell -face  . Depression with anxiety 01/08/2011  . Esophageal reflux   . Fibromyalgia   . Heart murmur   . Hyperparathyroidism ([place]) 08/18/2014  . Knee pain, right 09/20/2011  . Motion sickness 12/24/2011  . Nausea & vomiting 12/24/2011  . Obesity   . Osteopenia 08/05/2014  . Panic attacks   . Pelvic floor dysfunction 09/02/2016  . Sleep apnea, obstructive   . Varicose veins of lower limb 10/02/2011    BP 132/80 (BP Location: Left Arm, Patient Position: Sitting, Cuff Size: Normal)   Pulse 62   Temp 98 F (36.7 C) (Oral)   Ht 5' (1.524 m)   Wt 189 lb (85.7 kg)   SpO2 96%   BMI 36.91 kg/m  General: Awake, alert, appears stated age Eyes: PERRLA, EOMi Ears: Patent, TM's neg b/l Heart: RRR, +SEM heard loudest at aortic listening post Lungs: CTAB, no accessory muscle use MSK: 5/5 strength throughout, gait normal Neuro: No cerebellar signs, patellar reflex 1/4 b/l wo clonus, calcaneal reflex 1/4 b/l wo clonus, biceps reflex 1/4 b/l wo clonus; Dix-Hall-Pike positive on R, did not perform due to sig response on R. Psych: Age appropriate judgment and insight, normal mood and affect  Benign paroxysmal positional vertigo, unspecified laterality  Push fluids, Epley maneuver discussed.  YouTube if no improvement or understanding with  the written instructions.  Call/send message on Monday if no improvement and we will set up with vestibular rehabilitation. F/u prn with this. Pt voiced understanding and agreement to the plan.  [place], DO 11/20/17 12:06 PM

## 2017-11-21 ENCOUNTER — Other Ambulatory Visit: Payer: Self-pay | Admitting: Family Medicine

## 2017-11-22 ENCOUNTER — Ambulatory Visit (INDEPENDENT_AMBULATORY_CARE_PROVIDER_SITE_OTHER): Payer: Medicare Other | Admitting: Neurology

## 2017-11-22 ENCOUNTER — Encounter: Payer: Self-pay | Admitting: Neurology

## 2017-11-22 VITALS — BP 142/74 | HR 59 | Ht 60.0 in | Wt 188.0 lb

## 2017-11-22 DIAGNOSIS — R202 Paresthesia of skin: Secondary | ICD-10-CM

## 2017-11-22 DIAGNOSIS — R55 Syncope and collapse: Secondary | ICD-10-CM | POA: Diagnosis not present

## 2017-11-22 HISTORY — DX: Syncope and collapse: R55

## 2017-11-22 NOTE — Progress Notes (Signed)
Reason for visit: Syncope  Referring physician: Dr. Elissa Wood is a 72 y.o. female  History of present illness:  [name] is a 72 year old right-handed white female with a history of vasovagal syncope throughout her entire life.  She began having episodes of syncope that occurred in elementary school, she has had them throughout her life off and on.  The patient may have several syncopal events a year, the most recent one occurred on 10 July 2017.  The patient was at home, she had just taken a shower, she began to feel queasy and lightheaded.  The patient had slight nausea.  She recognized the symptoms as her presyncope, she tried to get to the bed to lie down.  In the past, she has been able to lie down and prevent a blackout episode.  Before she could get to the bed, she became increasingly dizzy, she went to lie down on the carpet in the bedroom, and then blacked out.  The husband was in the room, the patient had called out to him before she blacked out.  He started getting out of bed, the patient blacked out before he could get to her, but as soon as he got to her, she was regaining consciousness.  The duration of loss of conscious was only a few seconds.  The patient however sustained urinary and fecal incontinence, the patient had diarrhea, not a normal bowel movement.  The patient was diaphoretic, blanched in the face.  The patient was able to get up and lie down the bed, she felt wiped out for several hours afterwards.  The patient eventually was told to go to the emergency room, when she went to the emergency room, she was noted to be febrile with a temperature of around 100.  Her white blood count was elevated.  The source of the fever was never determined.  In the past, the blackout episodes have been associated with abdominal pain, pain from other sources, or with intercurrent illnesses.  The patient denied any palpitations of the heart or chest pain.  The patient was noted  to be in trigeminy in the emergency room.  The patient is followed through Dr. Nadyne Wood for a mitral valve murmur.  She comes to this office for further evaluation.  The patient also reports issues with numbness in the hands, she has difficulty with using her hands, she has had prior bilateral carpal tunnel syndrome surgery.  The strength of the hands is declined.  She has had prior neck and low back surgery.  Past Medical History:  Diagnosis Date  . Allergy    rhinitis  . Anemia 02-01-12   iron absorption problem  . Arthritis 02-01-12   osteoarthritis, Back pain, spine surgeries ? retain hardware cervial and  lumbar.  . Cancer ([place]) 02-01-12   squamous cell -face  . Depression with anxiety 01/08/2011  . Esophageal reflux   . Fibromyalgia   . Heart murmur   . Hyperparathyroidism ([place]) 08/18/2014  . Knee pain, right 09/20/2011  . Motion sickness 12/24/2011  . Nausea & vomiting 12/24/2011  . Obesity   . Osteopenia 08/05/2014  . Panic attacks   . Pelvic floor dysfunction 09/02/2016  . Sleep apnea, obstructive   . Varicose veins of lower limb 10/02/2011    Past Surgical History:  Procedure Laterality Date  . ABDOMINAL SURGERY  02-01-12   ABDOMINAL SURGERY  . APPENDECTOMY  1962  . BARIATRIC SURGERY    . BLADDER SUSPENSION  complicated by bowel nick/ clolostomy  . BLEPHAROPLASTY Bilateral   . CARPAL TUNNEL RELEASE  02-01-12   right  . CATARACT EXTRACTION Bilateral   . CERVICAL DISC SURGERY     C-7 in 2004, C-4 and C-5 in 2010  . colostomy bag     Confirm with patient. Listed under medical conditions on form dated 07/18/09.  [name] COLOSTOMY REVERSAL  02-01-12  . EYE SURGERY     b/l cataracts and b/l blepharplasty  . [place]  . GASTRIC BYPASS  2003   Patient also noted "gastric bypass resection - 2004"  . HERNIA REPAIR  2009   Two hernias and abdominal reconstruction  . ivc filter     prophyllactically- no hx DVT  . LUMBAR DISC SURGERY     L1-L3 all had surgery most  recently in 2017  . LUMBAR DISC SURGERY     L3-L4, Dr. Trenton Wood  . pannilectomy     Per medical history form dated 07/18/09.  [name] TOTAL KNEE ARTHROPLASTY  02/11/2012   Procedure: TOTAL KNEE ARTHROPLASTY;  Surgeon: Gail Pole, MD;  Location: [facility];  Service: Orthopedics;  Laterality: Right;  . ULNAR NERVE REPAIR Left 07/18/2016   by Dr.Henry Wood  . VULVA SURGERY  02-01-12   cyst removal-pt 8 months pregnant    Family History  Problem Relation Age of Onset  . Alcohol abuse Mother   . Other Mother        accidental med overdose  . Emphysema Mother        smoked  . Bipolar disorder Mother   . Other Father        CHF  . Diabetes Father   . Cancer Father        throat ca/ smoked  . Alcohol abuse Father   . Stroke Father   . Hypertension Brother   . Hyperlipidemia Brother   . Obesity Brother   . Heart attack Maternal Grandfather   . Heart attack Paternal Grandmother   . Heart attack Paternal Grandfather   . Arthritis Son        psoriatic  . Arthritis Son        psoriatic  . Obesity Son     Social history:  reports that she has never smoked. She has never used smokeless tobacco. She reports that she does not drink alcohol or use drugs.  Medications:  Prior to Admission medications   Medication Sig Start Date End Date Taking? Authorizing Provider  ALPRAZolam (XANAX) 0.25 MG tablet Take 1 tablet (0.25 mg total) by mouth daily as needed. For anxiety 08/12/17  Yes Gail Pal, DO  aspirin 81 MG tablet Take 81 mg by mouth daily.   Yes [provider]  azelastine (ASTELIN) 0.1 % nasal spray USE 1 TO 2 DOSES IN EACH NOSTRIL ONCE DAILY TO TWICE DAILY AS NEEDED 04/22/17  Yes Wood, Gail D, MD  benzonatate (TESSALON) 100 MG capsule Take 1 capsule (100 mg total) by mouth 3 (three) times daily as needed. 04/29/17  Yes Gail Pal, DO  DULoxetine (CYMBALTA) 60 MG capsule TAKE 1 CAPSULE BY MOUTH ONCE DAILY 10/15/17  Yes Gail Pal, DO  fluticasone  [facility]) 50 MCG/ACT nasal spray 2 sprays each nostril once or twice daily as neededs 10/17/17  Yes Wood, Gail D, MD  gabapentin (NEURONTIN) 100 MG capsule Take 2 capsules (200 mg total) by mouth at bedtime. 08/08/17  Yes Gail Pulley, DO  hydrOXYzine (ATARAX/VISTARIL) 10 MG  tablet Take 1-2 tablets (10-20 mg total) by mouth 3 (three) times daily as needed for itching. 12/26/16  Yes Gail Pal, DO  levocetirizine (XYZAL) 5 MG tablet Take 5 mg by mouth every evening.   Yes [provider]  methylphenidate (RITALIN) 10 MG tablet Take 1 tablet (10 mg total) by mouth as needed. 08/08/17  Yes [name], Gail Oyster, DO  NON FORMULARY CPAP- set on [employer].   Yes [provider]  OPTIMAL-Wood 02725 units capsule TAKE 2 CAPSULES BY MOUTH ONCE A WEEK 06/12/17  Yes [name], Gail Oyster, DO  rosuvastatin (CRESTOR) 20 MG tablet Take 1 tablet (20 mg total) by mouth daily. 10/21/17  Yes Gail Pal, DO  traZODone (DESYREL) 100 MG tablet TAKE 1/2 TO 1 (ONE-HALF TO ONE) TABLET BY MOUTH ONCE DAILY AS NEEDED FOR SLEEP 08/26/17  Yes Gail Lukes, MD  VESICARE 10 MG tablet TAKE 1 TABLET BY MOUTH ONCE DAILY 07/01/17  Yes Gail Pal, DO      Allergies  Allergen Reactions  . Hydrocodone Nausea And Vomiting     nausea and vomiting and headaches  . Myrbetriq [Mirabegron] Hives    Hives and itching  . Advil [Ibuprofen] Hives and Itching  . Amoxicillin Hives  . Cephalexin      Neuropathy  . Codeine     Crazy in the head, bp drops  . Levofloxacin Itching  . Lyrica [Pregabalin] Other (See Comments)    "Makes me like a zombie. I'm awake but I'm in slow motion."  . Meperidine Hcl     B/P drops  . Morphine And Related      States" extreme migraines"  . Oxycodone-Acetaminophen Other (See Comments)    Crazy in head, drop in bp  . Oxycodone-Acetaminophen Other (See Comments)  . Sulfonamide Derivatives Nausea And Vomiting    Stomach upset  . Adhesive [Tape]  Rash    Latex in adhesive tape. Please use paper tape  . Erythromycin Diarrhea and Rash    Other reaction(s): GI Intolerance  . Penicillins Nausea And Vomiting and Rash    ROS:  Out of a complete 14 system review of symptoms, the patient complains only of the following symptoms, and all other reviewed systems are negative.  Fatigue Heart murmur Dizziness Snoring Incontinence the bowel and bladder Easy bruising Feeling hot, flushing Joint pain, aching muscles Allergies, runny nose Memory loss, weakness, dizziness, passing out, tremor Anxiety, decreased energy, racing thoughts Restless legs  Blood pressure (!) 142/74, pulse (!) 59, height 5' (1.524 m), weight 188 lb (85.3 kg).  Physical Exam  General: The patient is alert and cooperative at the time of the examination.  The patient is moderately to markedly obese.  Eyes: Pupils are equal, round, and reactive to light. Discs are flat bilaterally.  Neck: The neck is supple, no carotid bruits are noted.  Respiratory: The respiratory examination is clear.  Cardiovascular: The cardiovascular examination reveals a regular rate and rhythm, a grade I/VI systolic ejection murmur at the left lower sternal border.  Skin: Extremities are without significant edema.  Neurologic Exam  Mental status: The patient is alert and oriented x 3 at the time of the examination. The patient has apparent normal recent and remote memory, with an apparently normal attention span and concentration ability.  Cranial nerves: Facial symmetry is present. There is good sensation of the face to pinprick and soft touch bilaterally. The strength of the facial muscles and the muscles to head turning and shoulder  shrug are normal bilaterally. Speech is well enunciated, no aphasia or dysarthria is noted. Extraocular movements are full. Visual fields are full. The tongue is midline, and the patient has symmetric elevation of the soft palate. No obvious hearing  deficits are noted.  Motor: The motor testing reveals 5 over 5 strength of all 4 extremities. Good symmetric motor tone is noted throughout.  Sensory: Sensory testing is intact to pinprick, soft touch, vibration sensation, and position sense on all 4 extremities. No evidence of extinction is noted.  Coordination: Cerebellar testing reveals good finger-nose-finger and heel-to-shin bilaterally.  Tinel sign at the wrists are positive bilaterally.  Gait and station: Gait is normal. Tandem gait is unsteady. Romberg is negative, but is unsteady. No drift is seen.  Reflexes: Deep tendon reflexes are symmetric and normal bilaterally. Toes are downgoing bilaterally.   Assessment/Plan:  1.  Vasovagal syncope  2.  Bilateral hand numbness, clumsiness  The patient appears to have a lifelong history of events typical for vasovagal syncope.  This most recent event in June 2019 was also typical for vasovagal syncope.  Abdominal complaints such as stomach cramps or nausea are very prominent activators for vasovagal syncope, the patient had diarrhea, fever, elevated white count with the last event, this was likely the initiator for the vasovagal syncope.  Urinary incontinence is not uncommon with syncope.  The event was witnessed, no stiffening or jerking is noted, the patient had loss of consciousness only for a few seconds.  This event likely does not represent a seizure.  Given the hand clumsiness and numbness, nerve conduction studies will be done on both arms, EMG on one arm.  The patient will follow up with her cardiology physician.  Gail Alexanders MD 11/22/2017 10:15 AM  [facility] [address] [place], [place] [address]  Phone [phone] Fax [phone]

## 2017-11-25 ENCOUNTER — Ambulatory Visit: Payer: Medicare Other | Admitting: Family Medicine

## 2017-11-27 DIAGNOSIS — N9489 Other specified conditions associated with female genital organs and menstrual cycle: Secondary | ICD-10-CM | POA: Diagnosis not present

## 2017-11-27 DIAGNOSIS — N3946 Mixed incontinence: Secondary | ICD-10-CM | POA: Diagnosis not present

## 2017-11-27 DIAGNOSIS — Z8742 Personal history of other diseases of the female genital tract: Secondary | ICD-10-CM | POA: Diagnosis not present

## 2017-11-27 DIAGNOSIS — R151 Fecal smearing: Secondary | ICD-10-CM | POA: Diagnosis not present

## 2017-11-27 DIAGNOSIS — N952 Postmenopausal atrophic vaginitis: Secondary | ICD-10-CM | POA: Diagnosis not present

## 2017-12-05 DIAGNOSIS — H6983 Other specified disorders of Eustachian tube, bilateral: Secondary | ICD-10-CM | POA: Diagnosis not present

## 2017-12-18 DIAGNOSIS — I6529 Occlusion and stenosis of unspecified carotid artery: Secondary | ICD-10-CM | POA: Diagnosis not present

## 2017-12-25 DIAGNOSIS — I350 Nonrheumatic aortic (valve) stenosis: Secondary | ICD-10-CM | POA: Diagnosis not present

## 2017-12-25 DIAGNOSIS — R55 Syncope and collapse: Secondary | ICD-10-CM | POA: Diagnosis not present

## 2017-12-25 DIAGNOSIS — I10 Essential (primary) hypertension: Secondary | ICD-10-CM | POA: Diagnosis not present

## 2017-12-25 DIAGNOSIS — I452 Bifascicular block: Secondary | ICD-10-CM | POA: Diagnosis not present

## 2017-12-25 DIAGNOSIS — R0602 Shortness of breath: Secondary | ICD-10-CM | POA: Diagnosis not present

## 2017-12-30 ENCOUNTER — Ambulatory Visit: Payer: Self-pay

## 2017-12-30 ENCOUNTER — Ambulatory Visit (INDEPENDENT_AMBULATORY_CARE_PROVIDER_SITE_OTHER): Payer: Medicare Other | Admitting: Neurology

## 2017-12-30 ENCOUNTER — Encounter: Payer: Self-pay | Admitting: Neurology

## 2017-12-30 DIAGNOSIS — G5603 Carpal tunnel syndrome, bilateral upper limbs: Secondary | ICD-10-CM

## 2017-12-30 DIAGNOSIS — R202 Paresthesia of skin: Secondary | ICD-10-CM

## 2017-12-30 NOTE — Procedures (Signed)
     HISTORY:  [name] is a 72 year old patient with a history of bilateral carpal tunnel syndrome previously, status post surgery.  The patient has had recurrence of symptoms with numbness and tingling sensations in both hands.  She is being evaluated for a neuropathy or a cervical radiculopathy.  She has had prior cervical spine surgery.  NERVE CONDUCTION STUDIES:  Nerve conduction studies were performed on both upper extremities.  The distal motor latencies and motor amplitudes for the median and ulnar nerves were normal bilaterally with normal nerve conduction velocities seen for these nerves bilaterally.  The sensory latencies for the median nerves were prolonged on the right, borderline normal on the left and normal for the ulnar nerves bilaterally.  The F-wave latencies for the ulnar nerves were normal bilaterally.  EMG STUDIES:  EMG study was performed on the right upper extremity:  The first dorsal interosseous muscle reveals 2 to 4 K units with slightly decreased recruitment. No fibrillations or positive waves were noted. The abductor pollicis brevis muscle reveals 2 to 5 K units with decreased recruitment. No fibrillations or positive waves were noted. The extensor indicis proprius muscle reveals 1 to 3 K units with full recruitment. No fibrillations or positive waves were noted. The pronator teres muscle reveals 2 to 3 K units with full recruitment. No fibrillations or positive waves were noted. The biceps muscle reveals 1 to 2 K units with full recruitment. No fibrillations or positive waves were noted. The triceps muscle reveals 2 to 4 K units with full recruitment. No fibrillations or positive waves were noted. The anterior deltoid muscle reveals 2 to 3 K units with full recruitment. No fibrillations or positive waves were noted. The cervical paraspinal muscles were tested at 2 levels. No abnormalities of insertional activity were seen at either level tested. There was good  relaxation.   IMPRESSION:  Nerve conduction studies done on both upper extremities shows evidence of mild sensory prolongation for the right median nerve, borderline normal on the left, the patient does not appear to have overt carpal tunnel syndrome however.  EMG evaluation of the right upper extremity shows chronic stable signs of denervation in the APB muscle on the right hand consistent with a prior history of carpal tunnel syndrome, no evidence of an overlying cervical radiculopathy was seen.  Jill Alexanders MD 12/30/2017 4:03 PM  [facility] [address] [place], [place] [address]  Phone [phone] Fax [phone]

## 2017-12-30 NOTE — Progress Notes (Signed)
Please refer to EMG and nerve conduction procedure note.  

## 2017-12-30 NOTE — Progress Notes (Addendum)
The patient returns for EMG nerve conduction studies today.  The patient reports tingling sensations in both hands, nerve conduction studies were unremarkable with exception that there was a mild sensory prolongation for the right median nerve, borderline normal on the left.  The patient has had bilateral carpal tunnel syndrome surgery in the past.  She also reports jerking and twitching of the hands, left greater than right.  The patient is on gabapentin but takes only 200 mg in the evening.  The plan is to have the patient use wrist splints over the next 2 months, if she is no better, she will have a repeat MRI of the cervical spine, she did have a study done of the neck in May 2018.     [place]    Nerve / Sites Muscle Latency Ref. Amplitude Ref. Rel Amp Segments Distance Velocity Ref. Area    ms ms mV mV %  cm m/s m/s mVms  R Median - APB     Wrist APB 3.6 ?4.4 5.1 ?4.0 100 Wrist - APB 7   14.3     Upper arm APB 7.4  5.2  103 Upper arm - Wrist 19 50 ?49 15.8  L Median - APB     Wrist APB 3.8 ?4.4 8.2 ?4.0 100 Wrist - APB 7   23.9     Upper arm APB 7.7  8.4  102 Upper arm - Wrist 19 49 ?49 21.8  R Ulnar - ADM     Wrist ADM 2.8 ?3.3 10.4 ?6.0 100 Wrist - ADM 7   29.7     B.Elbow ADM 5.6  8.6  82.7 B.Elbow - Wrist 16 56 ?49 26.0     A.Elbow ADM 7.2  8.7  101 A.Elbow - B.Elbow 10 64 ?49 27.0         A.Elbow - Wrist      L Ulnar - ADM     Wrist ADM 2.2 ?3.3 8.2 ?6.0 100 Wrist - ADM 7   23.2     B.Elbow ADM 4.6  6.1  74.2 B.Elbow - Wrist 16 67 ?49 22.7     A.Elbow ADM 5.9  5.4  89 A.Elbow - B.Elbow 10 74 ?49 21.6         A.Elbow - Wrist                 SNC    Nerve / Sites Rec. Site Peak Lat Ref.  Amp Ref. Segments Distance    ms ms V V  cm  R Median - Orthodromic (Dig II, Mid palm)     Dig II Wrist 3.5 ?3.4 7 ?10 Dig II - Wrist 13  L Median - Orthodromic (Dig II, Mid palm)     Dig II Wrist 3.4 ?3.4 9 ?10 Dig II - Wrist 13  R Ulnar - Orthodromic, (Dig V, Mid palm)     Dig V Wrist 2.8  ?3.1 11 ?5 Dig V - Wrist 11  L Ulnar - Orthodromic, (Dig V, Mid palm)     Dig V Wrist 2.7 ?3.1 7 ?5 Dig V - Wrist 69              F  Wave    Nerve F Lat Ref.   ms ms  R Ulnar - ADM 26.3 ?32.0  L Ulnar - ADM 24.9 ?32.0

## 2017-12-30 NOTE — Telephone Encounter (Signed)
Phone call returned to pt.  C/o right knee pain.  Stated she has see Dr. Tamala Julian several times in past for this.  Reported she has "bursitis" in the medial right knee area.  Rated the pain at 5/10, but stated today she isn't doing much, and has been trying to "baby the knee."  Stated she has intermittent swelling; denied redness or warmth.  Denied fever/ chills.  Takes Ibuprofen, and alternates ice with heat to manage the pain.  Reported she did a lot of raking and yard work, prior to Thanksgiving, and feels this has contributed to her increased pain.  Also reported low back ache, and right shoulder pain, since the yard work.  Also, reported she has intermittent leg cramps from knee to groin in right upper leg.  Denied swelling of the thigh or groin area. Denied redness or warmth of right upper leg.  Stated she prefers to see Dr. Tamala Julian, but if she can't, she will see her PCP.  Stated "I don't like to be passed around."  No avail. In appts. With Dr. Creig Hines x 1 mo.  Appt. Sched. To see Dr. Nani Ravens 01/01/18.  Care advice given per protocol.  Verb. Understanding.  Knows to call back if sx's worsen.    Reason for Disposition . [1] MODERATE pain (e.g., interferes with normal activities, limping) AND [2] present > 3 days  Answer Assessment - Initial Assessment Questions 1. LOCATION and RADIATION: "Where is the pain located?"      Left side of right knee cap  2. QUALITY: "What does the pain feel like?"  (e.g., sharp, dull, aching, burning)     Burning pain or can be an "unrelenting"    3. SEVERITY: "How bad is the pain?" "What does it keep you from doing?"   (Scale 1-10; or mild, moderate, severe)   -  MILD (1-3): doesn't interfere with normal activities    -  MODERATE (4-7): interferes with normal activities (e.g., work or school) or awakens from sleep, limping    -  SEVERE (8-10): excruciating pain, unable to do any normal activities, unable to walk     5/10 4. ONSET: "When did the pain start?" "Does it  come and go, or is it there all the time?"     Has had for several months to a year;  5. RECURRENT: "Have you had this pain before?" If so, ask: "When, and what happened then?"     Yes; has hx of right knee replacement 6 yrs ago; intermittent pain of the right knee since her surgery    6. SETTING: "Has there been any recent work, exercise or other activity that involved that part of the body?"      Raking/ yard work that flared up her pain  7. AGGRAVATING FACTORS: "What makes the knee pain worse?" (e.g., walking, climbing stairs, running)     Walking / shopping aggravates the pain  8. ASSOCIATED SYMPTOMS: "Is there any swelling or redness of the knee?"     Intermittent swelling; denied redness or warmth; using ice and heat alternating  9. OTHER SYMPTOMS: "Do you have any other symptoms?" (e.g., chest pain, difficulty breathing, fever, calf pain)     Intermittent cramps in the thigh of the right leg; denied swelling of the thigh 10. PREGNANCY: "Is there any chance you are pregnant?" "When was your last menstrual period?"       N/a  Protocols used: KNEE PAIN-A-AH

## 2018-01-01 ENCOUNTER — Other Ambulatory Visit: Payer: Self-pay | Admitting: Family Medicine

## 2018-01-01 ENCOUNTER — Encounter: Payer: Self-pay | Admitting: Family Medicine

## 2018-01-01 ENCOUNTER — Ambulatory Visit (INDEPENDENT_AMBULATORY_CARE_PROVIDER_SITE_OTHER): Payer: Medicare Other | Admitting: Family Medicine

## 2018-01-01 VITALS — BP 132/80 | HR 65 | Temp 98.5°F | Ht 60.0 in | Wt 187.5 lb

## 2018-01-01 DIAGNOSIS — M7050 Other bursitis of knee, unspecified knee: Secondary | ICD-10-CM | POA: Diagnosis not present

## 2018-01-01 DIAGNOSIS — R252 Cramp and spasm: Secondary | ICD-10-CM | POA: Diagnosis not present

## 2018-01-01 DIAGNOSIS — N3946 Mixed incontinence: Secondary | ICD-10-CM | POA: Diagnosis not present

## 2018-01-01 MED ORDER — DICLOFENAC SODIUM 2 % TD SOLN: TRANSDERMAL | 1 refills | Status: DC

## 2018-01-01 NOTE — Patient Instructions (Addendum)
Stay hydrated.  Someone will call you over the next couple days and will ask for payment information for the [name]. It will be mailed to you shortly afterwards.  Let me know if it is too expensive or if it doesn't work.   Consider a spoonful of pickle juice before bed. Not the sweet stuff.  Let us know if you need anything.

## 2018-01-01 NOTE — Progress Notes (Signed)
Pre visit review using our clinic review tool, if applicable. No additional management support is needed unless otherwise documented below in the visit note. 

## 2018-01-01 NOTE — Progress Notes (Signed)
Chief Complaint  Patient presents with  . Knee Pain    leg cramps    Subjective: Patient is a 72 y.o. female here for R knee pain.  Hx of knee replacement. Has done well with [name] in past and would like rx. Had raked leaves prior to [place] and flared up her knee.   She has also been having leg cramps.  Admittedly, her oral intake of water is not good.  She also was recently placed on Diovan-HCT.  ROS: MSK: +R knee pain  Past Medical History:  Diagnosis Date  . Allergy    rhinitis  . Anemia 02-01-12   iron absorption problem  . Arthritis 02-01-12   osteoarthritis, Back pain, spine surgeries ? retain hardware cervial and  lumbar.  . Cancer ([place]) 02-01-12   squamous cell -face  . Depression with anxiety 01/08/2011  . Esophageal reflux   . Fibromyalgia   . Heart murmur   . Hyperparathyroidism ([place]) 08/18/2014  . Knee pain, right 09/20/2011  . Motion sickness 12/24/2011  . Nausea & vomiting 12/24/2011  . Obesity   . Osteopenia 08/05/2014  . Panic attacks   . Pelvic floor dysfunction 09/02/2016  . Sleep apnea, obstructive   . Varicose veins of lower limb 10/02/2011  . Vasovagal syncope 11/22/2017    Objective: BP 132/80 (BP Location: Left Arm, Patient Position: Sitting, Cuff Size: Normal)   Pulse 65   Temp 98.5 F (36.9 C) (Oral)   Ht 5' (1.524 m)   Wt 187 lb 8 oz (85 kg)   SpO2 93%   BMI 36.62 kg/m  General: Awake, appears stated age MSK: +TTP over pes anserine bursa on R, neg Lachman's, Stine's, varus/valgus Heart: RRR, no murmurs Lungs: CTAB, no rales, wheezes or rhonchi. No accessory muscle use Psych: Age appropriate judgment and insight, normal affect and mood  Assessment and Plan: Leg cramping  Pes anserine bursitis - Plan: Diclofenac Sodium 2 % SOLN  Hydration recommended.  Also suggested a spoonful of pickle juice nightly. Order as above.  [facility].  If this is not covered, we will use  good Rx to help find something in the area.  If that does not work, we will inject the area. Follow-up in 3 months for her med check. The patient voiced understanding and agreement to the plan.  [place], DO 01/01/18  12:16 PM

## 2018-01-08 DIAGNOSIS — R32 Unspecified urinary incontinence: Secondary | ICD-10-CM | POA: Diagnosis not present

## 2018-01-09 ENCOUNTER — Encounter (INDEPENDENT_AMBULATORY_CARE_PROVIDER_SITE_OTHER): Payer: Medicare Other

## 2018-01-10 DIAGNOSIS — R55 Syncope and collapse: Secondary | ICD-10-CM | POA: Diagnosis not present

## 2018-01-10 DIAGNOSIS — R0602 Shortness of breath: Secondary | ICD-10-CM | POA: Diagnosis not present

## 2018-01-10 DIAGNOSIS — I10 Essential (primary) hypertension: Secondary | ICD-10-CM | POA: Diagnosis not present

## 2018-01-13 DIAGNOSIS — I10 Essential (primary) hypertension: Secondary | ICD-10-CM | POA: Diagnosis not present

## 2018-01-16 ENCOUNTER — Telehealth: Payer: Self-pay | Admitting: *Deleted

## 2018-01-16 NOTE — Telephone Encounter (Signed)
Received Nuclear Cardiology Report from [facility]; forwarded to provider/SLS 12/26

## 2018-01-23 DIAGNOSIS — R55 Syncope and collapse: Secondary | ICD-10-CM | POA: Diagnosis not present

## 2018-01-28 ENCOUNTER — Encounter (INDEPENDENT_AMBULATORY_CARE_PROVIDER_SITE_OTHER): Payer: Self-pay | Admitting: Family Medicine

## 2018-01-28 ENCOUNTER — Ambulatory Visit (INDEPENDENT_AMBULATORY_CARE_PROVIDER_SITE_OTHER): Payer: Medicare Other | Admitting: Family Medicine

## 2018-01-28 VITALS — BP 117/71 | HR 52 | Temp 98.0°F | Ht 59.0 in | Wt 186.0 lb

## 2018-01-28 DIAGNOSIS — R0602 Shortness of breath: Secondary | ICD-10-CM | POA: Diagnosis not present

## 2018-01-28 DIAGNOSIS — Z1331 Encounter for screening for depression: Secondary | ICD-10-CM

## 2018-01-28 DIAGNOSIS — R5383 Other fatigue: Secondary | ICD-10-CM | POA: Diagnosis not present

## 2018-01-28 DIAGNOSIS — E7849 Other hyperlipidemia: Secondary | ICD-10-CM | POA: Diagnosis not present

## 2018-01-28 DIAGNOSIS — E559 Vitamin D deficiency, unspecified: Secondary | ICD-10-CM

## 2018-01-28 DIAGNOSIS — E119 Type 2 diabetes mellitus without complications: Secondary | ICD-10-CM

## 2018-01-28 DIAGNOSIS — Z6837 Body mass index (BMI) 37.0-37.9, adult: Secondary | ICD-10-CM

## 2018-01-28 DIAGNOSIS — Z0289 Encounter for other administrative examinations: Secondary | ICD-10-CM

## 2018-01-29 ENCOUNTER — Encounter (INDEPENDENT_AMBULATORY_CARE_PROVIDER_SITE_OTHER): Payer: Self-pay | Admitting: Family Medicine

## 2018-01-29 DIAGNOSIS — R5383 Other fatigue: Secondary | ICD-10-CM | POA: Diagnosis not present

## 2018-01-29 DIAGNOSIS — E119 Type 2 diabetes mellitus without complications: Secondary | ICD-10-CM | POA: Diagnosis not present

## 2018-01-29 DIAGNOSIS — E559 Vitamin D deficiency, unspecified: Secondary | ICD-10-CM | POA: Diagnosis not present

## 2018-01-29 DIAGNOSIS — E7849 Other hyperlipidemia: Secondary | ICD-10-CM | POA: Diagnosis not present

## 2018-01-29 NOTE — Progress Notes (Signed)
.  Office: [phone]  /  Fax: [phone]   HPI:   Chief Complaint: OBESITY  [name] (MR# [id number]) is a 73 y.o. female who presents on 01/28/2018 for obesity evaluation and treatment. Current BMI is Body mass index is 37.57 kg/m.  [name] has struggled with obesity for years and has been unsuccessful in either losing weight or maintaining long term weight loss. [name] is status post duodenal switch weight loss surgery with gastric bypass revision in 2004. Her weight went from 303 lbs to 117 lbs in 18 months and she started gaining weight 1 year later.  [name] attended our information session and states she is currently in the action stage of change and ready to dedicate time achieving and maintaining a healthier weight.  [name] states her family eats meals together she could perhaps struggle with family and or coworkers weight loss sabotage her desired weight loss is 51 lbs she has been heavy most of her life she started gaining weight at age 67 her heaviest weight ever was 303 lbs. she has significant food cravings issues  she snacks frequently in the evenings she skips meals daily she is frequently drinking liquids with calories she frequently makes poor food choices she has binge eating behaviors she struggles with emotional eating    Fatigue [name] feels her energy is lower than it should be. This has worsened with weight gain and has not worsened recently. [name] admits to daytime somnolence and admits to waking up still tired. Patient has a history of obstructive sleep apnea. Patent has a history of symptoms of morning headache. Patient generally gets 6 or 9 hours of sleep per night, and states they generally have restless sleep. Snoring is present. Apneic episodes are present. Epworth Sleepiness Score is 12.  Dyspnea on exertion [name] notes increasing shortness of breath with exercising and seems to be worsening over time with weight gain. She notes getting out of breath sooner  with activity than she used to. This has not gotten worse recently. [name] denies orthopnea.  Diabetes II [name] has a diagnosis of diabetes type II. [name] states that her diabetes "has resolved". She is not on medications and her A1c's have been below 6.5 since her weight loss surgery. Her last A1c was 5.9 on 07/03/17.  Hyperlipidemia [name] has hyperlipidemia and has been trying to improve her cholesterol levels with intensive lifestyle modification including a low saturated fat diet, exercise and weight loss. [name] is on Crestor and would like to try to improve with diet. She denies any chest pain or myalgias.  Vitamin D deficiency [name] has a diagnosis of vitamin D deficiency. She is currently taking prescription vit D and is due for labs today. She admits fatigue and denies nausea, vomiting, or muscle weakness.  Depression Screen [name]'s Food and Mood (modified PHQ-9) score was 17. Depression screen PHQ 2/9 01/29/2018  Decreased Interest 3  Down, Depressed, Hopeless 2  PHQ - 2 Score 5  Altered sleeping 3  Tired, decreased energy 3  Change in appetite 1  Feeling bad or failure about yourself  1  Trouble concentrating 1  Moving slowly or fidgety/restless 3  Suicidal thoughts 0  PHQ-9 Score 17  Difficult doing work/chores Somewhat difficult  Some recent data might be hidden    ASSESSMENT AND PLAN:  Other fatigue - Plan: CBC With Differential, T3, T4, free, TSH  Shortness of breath on exertion  Type 2 diabetes mellitus without complication, without long-term current use of insulin ([place]) - Plan:  Comprehensive metabolic panel, Hemoglobin A1c, Insulin, random  Other hyperlipidemia - Plan: Lipid Panel With LDL/HDL Ratio  Vitamin D deficiency - Plan: VITAMIN D 25 Hydroxy (Vit-D Deficiency, Fractures)  Depression screening  Class 2 severe obesity with serious comorbidity and body mass index (BMI) of 37.0 to 37.9 in adult, unspecified obesity type ([place])  PLAN:  Fatigue [name] was informed  that her fatigue may be related to obesity, depression or many other causes. Labs will be ordered, and in the meanwhile [name] has agreed to work on diet, exercise and weight loss to help with fatigue. Proper sleep hygiene was discussed including the need for 7-8 hours of quality sleep each night. A sleep study was not ordered based on symptoms and Epworth score. An EKG and an indirect calorimetry was ordered today.  Dyspnea on exertion [name]'s shortness of breath appears to be obesity related and exercise induced. She has agreed to work on weight loss and gradually increase exercise to treat her exercise induced shortness of breath. If [name] follows our instructions and loses weight without improvement of her shortness of breath, we will plan to refer to pulmonology. We will monitor this condition regularly. Labs, an indirect calorimetry, and an EKG were ordered today. [name] agrees to this plan.  Diabetes II [name] has been given extensive diabetes education by myself today including ideal fasting and post-prandial blood glucose readings, individual ideal Hgb A1c goals, and hypoglycemia prevention. We discussed the importance of good blood sugar control to decrease the likelihood of diabetic complications such as nephropathy, neuropathy, limb loss, blindness, coronary artery disease, and death. We discussed the importance of intensive lifestyle modification including diet, exercise and weight loss as the first line treatment for diabetes. [name] was educated that her diabetes is diet controlled and may be considered in remission, but is not cured. She voiced understanding and agreed to start her diet. We will order labs today and she agrees to follow up in 2 weeks.  Hyperlipidemia [name] was informed of the American Heart Association Guidelines emphasizing intensive lifestyle modifications as the first line treatment for hyperlipidemia. We discussed many lifestyle modifications today in depth, and [name] will continue  to work on decreasing saturated fats such as fatty red meat, butter and many fried foods. She will also increase vegetables and lean protein in her diet and continue to work on exercise and weight loss efforts. We will check labs today. [name] agrees to continue Crestor and will follow up at the agreed upon time.  Vitamin D Deficiency [name] was informed that low vitamin D levels contributes to fatigue and are associated with obesity, breast, and colon cancer. She agrees to continue to take prescription Vit D @50 ,000 IU every week as prescribed and will follow up for routine testing of vitamin D, at least 2-3 times per year. She was informed of the risk of over-replacement of vitamin D and agrees to not increase her dose unless she discusses this with [place] first. We will order labs today and she will follow up at the agreed upon time.  Depression Screen [name] had a strongly positive depression screening. Depression is commonly associated with obesity and often results in emotional eating behaviors. We will monitor this closely and work on CBT to help improve the non-hunger eating patterns. Referral to Psychology may be required if no improvement is seen as she continues in our clinic.  Obesity [name] is currently in the action stage of change and her goal is to continue with weight loss efforts. She  has agreed to follow the Category 2 plan + 100 calories. [name] has been instructed to work up to a goal of 150 minutes of combined cardio and strengthening exercise per week for weight loss and overall health benefits. We discussed the following Behavioral Modification Strategies today: increasing lean protein intake, decreasing simple carbohydrates, decrease eating out, no skipping meals, and work on meal planning and easy cooking plans.  [name] has agreed to follow up with our clinic in 2 weeks. She was informed of the importance of frequent follow up visits to maximize her success with intensive lifestyle  modifications for her multiple health conditions. She was informed we would discuss her lab results at her next visit unless there is a critical issue that needs to be addressed sooner. [name] agreed to keep her next visit at the agreed upon time to discuss these results.  ALLERGIES: Allergies  Allergen Reactions  . Hydrocodone Nausea And Vomiting     nausea and vomiting and headaches  . Myrbetriq [Mirabegron] Hives    Hives and itching  . Advil [Ibuprofen] Hives and Itching  . Amoxicillin Hives  . Cephalexin      Neuropathy  . Codeine     Crazy in the head, bp drops  . Levofloxacin Itching  . Lyrica [Pregabalin] Other (See Comments)    "Makes me like a zombie. I'm awake but I'm in slow motion."  . Meperidine Hcl     B/P drops  . Morphine And Related      States" extreme migraines"  . Oxycodone-Acetaminophen Other (See Comments)    Crazy in head, drop in bp  . Oxycodone-Acetaminophen Other (See Comments)  . Sulfonamide Derivatives Nausea And Vomiting    Stomach upset  . Valsartan-Hydrochlorothiazide Other (See Comments)    Headaches  . Adhesive [Tape] Rash    [place] in adhesive tape. Please use paper tape  . Erythromycin Diarrhea and Rash    Other reaction(s): GI Intolerance  . Penicillins Nausea And Vomiting and Rash    MEDICATIONS: Current Outpatient Medications on File Prior to Visit  Medication Sig Dispense Refill  . ALPRAZolam (XANAX) 0.25 MG tablet Take 1 tablet (0.25 mg total) by mouth daily as needed. For anxiety 30 tablet 3  . aspirin 81 MG tablet Take 81 mg by mouth daily.    . DULoxetine (CYMBALTA) 60 MG capsule TAKE 1 CAPSULE BY MOUTH ONCE DAILY 90 capsule 1  . estrogens, conjugated, (PREMARIN) 0.625 MG tablet Take 0.625 mg by mouth daily. Take daily for 21 days then do not take for 7 days.    . famotidine (PEPCID) 20 MG tablet Take 20 mg by mouth 2 (two) times daily.    . fluticasone (FLONASE) 50 MCG/ACT nasal spray 2 sprays each nostril once or twice daily as  neededs 16 g 12  . hydrOXYzine (ATARAX/VISTARIL) 10 MG tablet Take 1-2 tablets (10-20 mg total) by mouth 3 (three) times daily as needed for itching. 30 tablet 1  . levocetirizine (XYZAL) 5 MG tablet Take 5 mg by mouth every evening.    . Magnesium 250 MG TABS Take 1 tablet by mouth daily.    . methylphenidate (RITALIN) 10 MG tablet Take 1 tablet (10 mg total) by mouth as needed. 60 tablet 0  . Multiple Vitamins-Minerals (ICAPS AREDS 2 PO) Take 2 tablets by mouth daily.    . NON FORMULARY CPAP- set on 12 [employer].    [name] omeprazole (PRILOSEC) 20 MG capsule Take 20 mg by mouth daily.    [name]  OPTIMAL-D 40814 units capsule TAKE 2 CAPSULES BY MOUTH ONCE A WEEK 8 capsule 4  . rosuvastatin (CRESTOR) 20 MG tablet Take 1 tablet (20 mg total) by mouth daily. 90 tablet 1  . traMADol (ULTRAM) 50 MG tablet Take 50 mg by mouth every 6 (six) hours as needed.    . traZODone (DESYREL) 100 MG tablet TAKE 1/2 TO 1 (ONE-HALF TO ONE) TABLET BY MOUTH ONCE DAILY AS NEEDED FOR SLEEP 90 tablet 0   No current facility-administered medications on file prior to visit.     PAST MEDICAL HISTORY: Past Medical History:  Diagnosis Date  . Allergy    rhinitis  . Anemia 02-01-12   iron absorption problem  . Arthritis 02-01-12   osteoarthritis, Back pain, spine surgeries ? retain hardware cervial and  lumbar.  . Asthma   . Back pain   . Cancer ([place]) 02-01-12   squamous cell -face  . Depression with anxiety 01/08/2011  . Diabetes ([place])   . Dyspnea   . Esophageal reflux   . Fibromyalgia   . Gallbladder problem   . Generalized headaches   . Heart murmur   . HLD (hyperlipidemia)   . HTN (hypertension)   . Hyperparathyroidism ([place]) 08/18/2014  . Joint pain   . Knee pain, right 09/20/2011  . Motion sickness 12/24/2011  . Nausea & vomiting 12/24/2011  . Nerve pain   . Obesity   . OSA (obstructive sleep apnea)   . Osteoarthritis   . Osteopenia 08/05/2014  . Panic attacks   . Pelvic floor dysfunction 09/02/2016  . Sleep  apnea, obstructive   . Stress incontinence   . Varicose veins of lower limb 10/02/2011  . Vasovagal syncope 11/22/2017  . Vitamin D deficiency     PAST SURGICAL HISTORY: Past Surgical History:  Procedure Laterality Date  . ABDOMINAL SURGERY  02-01-12   ABDOMINAL SURGERY  . APPENDECTOMY  1962  . BARIATRIC SURGERY    . BLADDER SUSPENSION     complicated by bowel nick/ clolostomy  . BLEPHAROPLASTY Bilateral   . CARPAL TUNNEL RELEASE  02-01-12   right  . CATARACT EXTRACTION Bilateral   . CERVICAL DISC SURGERY     C-7 in 2004, C-4 and C-5 in 2010  . colostomy bag     Confirm with patient. Listed under medical conditions on form dated 07/18/09.  [name] COLOSTOMY REVERSAL  02-01-12  . EYE SURGERY     b/l cataracts and b/l blepharplasty  . [place]  . GASTRIC BYPASS  2003   Patient also noted "gastric bypass resection - 2004"  . HERNIA REPAIR  2009   Two hernias and abdominal reconstruction  . ivc filter     prophyllactically- no hx DVT  . LUMBAR DISC SURGERY     L1-L3 all had surgery most recently in 2017  . LUMBAR DISC SURGERY     L3-L4, Dr. Trenton Gammon  . pannilectomy     Per medical history form dated 07/18/09.  [name] TOTAL KNEE ARTHROPLASTY  02/11/2012   Procedure: TOTAL KNEE ARTHROPLASTY;  Surgeon: Mauri Pole, MD;  Location: [facility];  Service: Orthopedics;  Laterality: Right;  . ULNAR NERVE REPAIR Left 07/18/2016   by Dr.Henry Pool  . VULVA SURGERY  02-01-12   cyst removal-pt 8 months pregnant    SOCIAL HISTORY: Social History   Tobacco Use  . Smoking status: Never Smoker  . Smokeless tobacco: Never Used  Substance Use Topics  . Alcohol use: No  Alcohol/week: 0.0 standard drinks    Comment: special occasions  . Drug use: No    FAMILY HISTORY: Family History  Problem Relation Age of Onset  . Alcohol abuse Mother   . Other Mother        accidental med overdose  . Emphysema Mother        smoked  . Bipolar disorder Mother   . Sudden death Mother   .  Liver disease Mother   . Other Father        CHF  . Diabetes Father   . Cancer Father        throat ca/ smoked  . Alcohol abuse Father   . Stroke Father   . Hypertension Father   . Liver disease Father   . Hypertension Brother   . Hyperlipidemia Brother   . Obesity Brother   . Heart attack Maternal Grandfather   . Heart attack Paternal Grandmother   . Heart attack Paternal Grandfather   . Arthritis Son        psoriatic  . Arthritis Son        psoriatic  . Obesity Son     ROS: Review of Systems  Constitutional: Positive for malaise/fatigue. Negative for weight loss.  HENT:       Positive for stuffiness. Positive for hay fever. Positive for dry mouth.  Respiratory: Shortness of breath:  with activity.   Cardiovascular: Negative for chest pain and orthopnea.       Positive for calf/ leg pain. Positive for leg cramping.   Gastrointestinal: Negative for nausea and vomiting.  Musculoskeletal: Positive for back pain and joint pain. Negative for myalgias.       Negative for muscle weakness. Positive for muscle pain. Positive for muscle stiffness.  Skin:       Positive for dryness. Positive for hair and nail changes.  Neurological: Positive for weakness.  Endo/Heme/Allergies: Bruises/bleeds easily.  Psychiatric/Behavioral: Positive for depression.       Positive for stress.    PHYSICAL EXAM: Blood pressure 117/71, pulse (!) 52, temperature 98 F (36.7 C), temperature source Oral, height 4\' 11"  (1.499 m), weight 186 lb (84.4 kg), SpO2 98 %. Body mass index is 37.57 kg/m. Physical Exam Vitals signs reviewed.  Constitutional:      Appearance: Normal appearance. She is obese.  HENT:     Head: Normocephalic and atraumatic.     Nose: Nose normal.  Eyes:     General: No scleral icterus.    Extraocular Movements: Extraocular movements intact.  Neck:     Musculoskeletal: Normal range of motion and neck supple.     Comments: Negative for thyromegaly. Cardiovascular:      Heart sounds: Murmur present. Systolic ( holo) murmur present with a grade of 3/6.  Pulmonary:     Effort: Pulmonary effort is normal. No respiratory distress.  Abdominal:     Palpations: Abdomen is soft.     Tenderness: There is no abdominal tenderness.     Comments: Positive for obesity.  Musculoskeletal:     Comments: ROM normal in all extremities.  Skin:    General: Skin is warm and dry.  Neurological:     Mental Status: She is alert and oriented to person, place, and time.     Coordination: Coordination normal.  Psychiatric:        Mood and Affect: Mood normal.        Behavior: Behavior normal.     RECENT LABS AND TESTS: BMET  Component Value Date/Time   NA 139 07/10/2017 1835   K 3.5 07/10/2017 1835   CL 106 07/10/2017 1835   CO2 25 07/10/2017 1835   GLUCOSE 105 (H) 07/10/2017 1835   BUN 17 07/10/2017 1835   CREATININE 0.72 07/10/2017 1835   CREATININE 0.83 11/28/2012 1153   CALCIUM 8.5 (L) 07/10/2017 1835   GFRNONAA >60 07/10/2017 1835   GFRAA >60 07/10/2017 1835   Lab Results  Component Value Date   HGBA1C 5.9 07/03/2017   No results found for: INSULIN CBC    Component Value Date/Time   WBC 13.0 (H) 07/10/2017 1835   RBC 3.88 07/10/2017 1835   HGB 11.1 (L) 07/10/2017 1835   HGB 12.4 02/19/2011 1425   HCT 34.5 (L) 07/10/2017 1835   HCT 37.1 02/19/2011 1425   PLT 203 07/10/2017 1835   PLT 220 02/19/2011 1425   MCV 88.9 07/10/2017 1835   MCV 93.7 02/19/2011 1425   MCH 28.6 07/10/2017 1835   MCHC 32.2 07/10/2017 1835   RDW 15.8 (H) 07/10/2017 1835   RDW 13.7 02/19/2011 1425   LYMPHSABS 1.5 07/10/2017 1835   LYMPHSABS 1.1 02/19/2011 1425   MONOABS 1.1 (H) 07/10/2017 1835   MONOABS 0.5 02/19/2011 1425   EOSABS 0.1 07/10/2017 1835   EOSABS 0.2 02/19/2011 1425   BASOSABS 0.0 07/10/2017 1835   BASOSABS 0.0 02/19/2011 1425   Iron/TIBC/Ferritin/ %Sat    Component Value Date/Time   IRON 66 02/04/2015 0738   TIBC 292 02/19/2011 1425    FERRITIN 15.5 08/05/2014 0945   IRONPCTSAT 17.3 (L) 02/04/2015 0738   IRONPCTSAT 17 (L) 02/19/2011 1425   Lipid Panel     Component Value Date/Time   CHOL 106 07/03/2017 1550   TRIG 66.0 07/03/2017 1550   HDL 51.80 07/03/2017 1550   CHOLHDL 2 07/03/2017 1550   VLDL 13.2 07/03/2017 1550   LDLCALC 41 07/03/2017 1550   Hepatic Function Panel     Component Value Date/Time   PROT 6.4 (L) 07/10/2017 1835   ALBUMIN 3.6 07/10/2017 1835   AST 20 07/10/2017 1835   ALT 17 07/10/2017 1835   ALKPHOS 75 07/10/2017 1835   BILITOT 0.7 07/10/2017 1835   BILIDIR 0.1 06/01/2013 0745   IBILI 0.6 02/02/2013 0850      Component Value Date/Time   TSH 2.99 09/06/2016 0751   ECG  shows NSR with a rate of 62 BPM. INDIRECT CALORIMETER done today shows a VO2 of 255 and a REE of 1775. Her calculated basal metabolic rate is 1610 thus her basal metabolic rate is better than expected.  OBESITY BEHAVIORAL INTERVENTION VISIT  Today's visit was # 1   Starting weight: 186 lbs Starting date: 01/28/2018 Today's weight : Weight: 186 lb (84.4 kg)  Today's date: 01/28/2018 Total lbs lost to date: 0 At least 15 minutes were spent on discussing the following behavioral intervention visit.  ASK: We discussed the diagnosis of obesity with Viktoriya T Ritthaler today and Lise agreed to give [place] permission to discuss obesity behavioral modification therapy today.  ASSESS: San has the diagnosis of obesity and her BMI today is 37.5. Jakyrah is in the action stage of change.   ADVISE: Kaeden was educated on the multiple health risks of obesity as well as the benefit of weight loss to improve her health. She was advised of the need for long term treatment and the importance of lifestyle modifications to improve her current health and to decrease her risk of future health problems.  AGREE: Multiple  dietary modification options and treatment options were discussed and Krystyl agreed to follow the recommendations documented in the  above note.  ARRANGE: Melat was educated on the importance of frequent visits to treat obesity as outlined per CMS and USPSTF guidelines and agreed to schedule her next follow up appointment today.   I, Marcille Blanco, am acting as transcriptionist for Starlyn Skeans, MD  I have reviewed the above documentation for accuracy and completeness, and I agree with the above. -Dennard Nip, MD

## 2018-01-30 ENCOUNTER — Ambulatory Visit: Payer: Medicare Other | Admitting: Family Medicine

## 2018-01-30 LAB — COMPREHENSIVE METABOLIC PANEL
ALBUMIN: 4.2 g/dL (ref 3.5–4.8)
ALT: 22 IU/L (ref 0–32)
AST: 29 IU/L (ref 0–40)
Albumin/Globulin Ratio: 2 (ref 1.2–2.2)
Alkaline Phosphatase: 103 IU/L (ref 39–117)
BUN/Creatinine Ratio: 24 (ref 12–28)
BUN: 16 mg/dL (ref 8–27)
Bilirubin Total: 0.4 mg/dL (ref 0.0–1.2)
CO2: 23 mmol/L (ref 20–29)
Calcium: 9 mg/dL (ref 8.7–10.3)
Chloride: 105 mmol/L (ref 96–106)
Creatinine, Ser: 0.67 mg/dL (ref 0.57–1.00)
GFR calc non Af Amer: 89 mL/min/{1.73_m2} (ref >59)
GFR, EST AFRICAN AMERICAN: 103 mL/min/{1.73_m2} (ref >59)
Globulin, Total: 2.1 g/dL (ref 1.5–4.5)
Glucose: 83 mg/dL (ref 65–99)
Potassium: 3.9 mmol/L (ref 3.5–5.2)
Sodium: 144 mmol/L (ref 134–144)
Total Protein: 6.3 g/dL (ref 6.0–8.5)

## 2018-01-30 LAB — HEMOGLOBIN A1C
Est. average glucose Bld gHb Est-mCnc: 117 mg/dL
Hgb A1c MFr Bld: 5.7 % — ABNORMAL HIGH (ref 4.8–5.6)

## 2018-01-30 LAB — CBC WITH DIFFERENTIAL
Basophils Absolute: 0.1 10*3/uL (ref 0.0–0.2)
Basos: 1 %
EOS (ABSOLUTE): 0.5 10*3/uL — ABNORMAL HIGH (ref 0.0–0.4)
Eos: 7 %
Hematocrit: 35 % (ref 34.0–46.6)
Hemoglobin: 10.8 g/dL — ABNORMAL LOW (ref 11.1–15.9)
Immature Grans (Abs): 0 10*3/uL (ref 0.0–0.1)
Immature Granulocytes: 0 %
Lymphocytes Absolute: 1.8 10*3/uL (ref 0.7–3.1)
Lymphs: 26 %
MCH: 28.1 pg (ref 26.6–33.0)
MCHC: 30.9 g/dL — ABNORMAL LOW (ref 31.5–35.7)
MCV: 91 fL (ref 79–97)
Monocytes Absolute: 0.6 10*3/uL (ref 0.1–0.9)
Monocytes: 8 %
Neutrophils Absolute: 4 10*3/uL (ref 1.4–7.0)
Neutrophils: 58 %
RBC: 3.84 x10E6/uL (ref 3.77–5.28)
RDW: 15 % (ref 11.7–15.4)
WBC: 7 10*3/uL (ref 3.4–10.8)

## 2018-01-30 LAB — T3: T3, Total: 98 ng/dL (ref 71–180)

## 2018-01-30 LAB — LIPID PANEL WITH LDL/HDL RATIO
Cholesterol, Total: 114 mg/dL (ref 100–199)
HDL: 58 mg/dL (ref >39)
LDL Calculated: 40 mg/dL (ref 0–99)
LDl/HDL Ratio: 0.7 ratio (ref 0.0–3.2)
Triglycerides: 78 mg/dL (ref 0–149)
VLDL Cholesterol Cal: 16 mg/dL (ref 5–40)

## 2018-01-30 LAB — INSULIN, RANDOM: INSULIN: 4.3 u[IU]/mL (ref 2.6–24.9)

## 2018-01-30 LAB — VITAMIN D 25 HYDROXY (VIT D DEFICIENCY, FRACTURES): Vit D, 25-Hydroxy: 23.7 ng/mL — ABNORMAL LOW (ref 30.0–100.0)

## 2018-01-30 LAB — T4, FREE: Free T4: 1.07 ng/dL (ref 0.82–1.77)

## 2018-01-30 LAB — TSH: TSH: 1.9 u[IU]/mL (ref 0.450–4.500)

## 2018-02-11 ENCOUNTER — Encounter (INDEPENDENT_AMBULATORY_CARE_PROVIDER_SITE_OTHER): Payer: Self-pay | Admitting: Family Medicine

## 2018-02-11 ENCOUNTER — Ambulatory Visit (INDEPENDENT_AMBULATORY_CARE_PROVIDER_SITE_OTHER): Payer: Medicare Other | Admitting: Family Medicine

## 2018-02-11 VITALS — BP 87/46 | HR 64 | Temp 98.5°F | Ht 59.0 in | Wt 178.0 lb

## 2018-02-11 DIAGNOSIS — R7303 Prediabetes: Secondary | ICD-10-CM | POA: Diagnosis not present

## 2018-02-11 DIAGNOSIS — E559 Vitamin D deficiency, unspecified: Secondary | ICD-10-CM | POA: Diagnosis not present

## 2018-02-11 DIAGNOSIS — Z6836 Body mass index (BMI) 36.0-36.9, adult: Secondary | ICD-10-CM

## 2018-02-11 MED ORDER — VITAMIN D (ERGOCALCIFEROL) 1.25 MG (50000 UNIT) PO CAPS: 50000.0000 [IU] | ORAL_CAPSULE | ORAL | 0 refills | Status: DC

## 2018-02-12 NOTE — Progress Notes (Signed)
Office: [phone]  /  Fax: [phone]   HPI:   Chief Complaint: OBESITY [name] is here to discuss her progress with her obesity treatment plan. She is on the Category 2 plan + 100 calories and is following her eating plan approximately 75 % of the time. She states she is walking for 15-20 minutes 7 times per week. [name] has done very well with weight loss even while traveling. When she ate out she tried to be mindful. She still drank regular sodas but less often. She often didn't eat all of her protein especially for dinner. She is status post duodenal switch.  Her weight is 178 lb (80.7 kg) today and has had a weight loss of 8 pounds over a period of 2 weeks since her last visit. She has lost 8 lbs since starting treatment with [place].  Vitamin D Deficiency [name] has a diagnosis of vitamin D deficiency. She is stable on prescription Vit D, but level is not at goal. She is status post duodenal switch and has problems with malabsorption. She takes both Vit D at the same time and likely isn't able to absorb both together. She denies nausea, vomiting, or muscle weakness.  Pre-Diabetes [name] has a new diagnosis of pre-diabetes based on her elevated Hgb A1c at 5.7 and was informed this puts her at greater risk of developing diabetes. She is not taking metformin currently and she notes polyphagia but has improved on diet prescription. She continues to work on diet and exercise to decrease risk of diabetes. She denies nausea or hypoglycemia.  ASSESSMENT AND PLAN:  Vitamin D deficiency - Plan: Vitamin D, Ergocalciferol, (DRISDOL) 1.25 MG (50000 UT) CAPS capsule  Prediabetes  Class 2 severe obesity with serious comorbidity and body mass index (BMI) of 36.0 to 36.9 in adult, unspecified obesity type ([place])  PLAN:  Vitamin D Deficiency [name] was informed that low vitamin D levels contributes to fatigue and are associated with obesity, breast, and colon cancer. [name] agrees to change prescription Vit D  to 50,000 IU every 3 days #10 with no refills. She will follow up for routine testing of vitamin D, at least 2-3 times per year. She was informed of the risk of over-replacement of vitamin D and agrees to not increase her dose unless she discusses this with [place] first. [name] agrees to follow up with our clinic in 2 weeks with Jake Bathe, [place].  Pre-Diabetes [name] will continue to work on weight loss, diet, exercise, and decreasing simple carbohydrates in her diet to help decrease the risk of diabetes. We dicussed metformin including benefits and risks. She was informed that eating too many simple carbohydrates or too many calories at one sitting increases the likelihood of GI side effects. Donnielle declined metformin for now and a prescription was not written today. We will recheck labs in 3 months. [name] agrees to follow up with our clinic in 2 weeks with Jake Bathe, FNP as directed to monitor her progress.  Obesity [name] is currently in the action stage of change. As such, her goal is to continue with weight loss efforts She has agreed to follow the Category 2 plan + 100 calories [name] has been instructed to work up to a goal of 150 minutes of combined cardio and strengthening exercise per week for weight loss and overall health benefits. We discussed the following Behavioral Modification Strategies today: increasing lean protein intake, decreasing simple carbohydrates, work on meal planning and easy cooking plans, and travel eating strategies  [name] has agreed to follow up with our clinic in 2 weeks with Jake Bathe, [place]. She was informed of the importance of frequent follow up visits to maximize her success with intensive lifestyle modifications for her multiple health conditions.  ALLERGIES: Allergies  Allergen Reactions  . Hydrocodone Nausea And Vomiting     nausea and vomiting and headaches  . Myrbetriq [Mirabegron] Hives    Hives and itching  . Advil [Ibuprofen] Hives and Itching  .  Amoxicillin Hives  . Cephalexin      Neuropathy  . Codeine     Crazy in the head, bp drops  . Levofloxacin Itching  . Lyrica [Pregabalin] Other (See Comments)    "Makes me like a zombie. I'm awake but I'm in slow motion."  . Meperidine Hcl     B/P drops  . Morphine And Related      States" extreme migraines"  . Oxycodone-Acetaminophen Other (See Comments)    Crazy in head, drop in bp  . Oxycodone-Acetaminophen Other (See Comments)  . Sulfonamide Derivatives Nausea And Vomiting    Stomach upset  . Valsartan-Hydrochlorothiazide Other (See Comments)    Headaches  . Adhesive [Tape] Rash    Latex in adhesive tape. Please use paper tape  . Erythromycin Diarrhea and Rash    Other reaction(s): GI Intolerance  . Penicillins Nausea And Vomiting and Rash    MEDICATIONS: Current Outpatient Medications on File Prior to Visit  Medication Sig Dispense Refill  . ALPRAZolam (XANAX) 0.25 MG tablet Take 1 tablet (0.25 mg total) by mouth daily as needed. For anxiety 30 tablet 3  . aspirin 81 MG tablet Take 81 mg by mouth daily.    . bisacodyl (DULCOLAX) 5 MG EC tablet Take 5 mg by mouth daily as needed for moderate constipation.    . conjugated estrogens (PREMARIN) vaginal cream Place 1 Applicatorful vaginally. 3 times a week    . DULoxetine (CYMBALTA) 60 MG capsule TAKE 1 CAPSULE BY MOUTH ONCE DAILY 90 capsule 1  . fluticasone (FLONASE) 50 MCG/ACT nasal spray 2 sprays each nostril once or twice daily as neededs 16 g 12  . hydrOXYzine (ATARAX/VISTARIL) 10 MG tablet Take 1-2 tablets (10-20 mg total) by mouth 3 (three) times daily as needed for itching. 30 tablet 1  . levocetirizine (XYZAL) 5 MG tablet Take 5 mg by mouth every evening.    . Magnesium 250 MG TABS Take 1 tablet by mouth daily.    . methylphenidate (RITALIN) 10 MG tablet Take 1 tablet (10 mg total) by mouth as needed. 60 tablet 0  . Multiple Vitamins-Minerals (ICAPS AREDS 2 PO) Take 2 tablets by mouth daily.    . NON FORMULARY CPAP-  set on 12 [employer].    [name] omeprazole (PRILOSEC) 20 MG capsule Take 20 mg by mouth daily.    . OPTIMAL-D 29528 units capsule TAKE 2 CAPSULES BY MOUTH ONCE A WEEK 8 capsule 4  . rosuvastatin (CRESTOR) 20 MG tablet Take 1 tablet (20 mg total) by mouth daily. 90 tablet 1  . traMADol (ULTRAM) 50 MG tablet Take 50 mg by mouth every 6 (six) hours as needed.    . traZODone (DESYREL) 100 MG tablet TAKE 1/2 TO 1 (ONE-HALF TO ONE) TABLET BY MOUTH ONCE DAILY AS NEEDED FOR SLEEP 90 tablet 0  . valsartan (DIOVAN) 40 MG tablet Take 40 mg by mouth daily.     No current facility-administered medications on file prior to visit.     PAST MEDICAL HISTORY: Past  Medical History:  Diagnosis Date  . Allergy    rhinitis  . Anemia 02-01-12   iron absorption problem  . Arthritis 02-01-12   osteoarthritis, Back pain, spine surgeries ? retain hardware cervial and  lumbar.  . Asthma   . Back pain   . Cancer ([place]) 02-01-12   squamous cell -face  . Depression with anxiety 01/08/2011  . Diabetes ([place])   . Dyspnea   . Esophageal reflux   . Fibromyalgia   . Gallbladder problem   . Generalized headaches   . Heart murmur   . HLD (hyperlipidemia)   . HTN (hypertension)   . Hyperparathyroidism ([place]) 08/18/2014  . Joint pain   . Knee pain, right 09/20/2011  . Motion sickness 12/24/2011  . Nausea & vomiting 12/24/2011  . Nerve pain   . Obesity   . OSA (obstructive sleep apnea)   . Osteoarthritis   . Osteopenia 08/05/2014  . Panic attacks   . Pelvic floor dysfunction 09/02/2016  . Sleep apnea, obstructive   . Stress incontinence   . Varicose veins of lower limb 10/02/2011  . Vasovagal syncope 11/22/2017  . Vitamin D deficiency     PAST SURGICAL HISTORY: Past Surgical History:  Procedure Laterality Date  . ABDOMINAL SURGERY  02-01-12   ABDOMINAL SURGERY  . APPENDECTOMY  1962  . BARIATRIC SURGERY    . BLADDER SUSPENSION     complicated by bowel nick/ clolostomy  . BLEPHAROPLASTY Bilateral   . CARPAL TUNNEL  RELEASE  02-01-12   right  . CATARACT EXTRACTION Bilateral   . CERVICAL DISC SURGERY     C-7 in 2004, C-4 and C-5 in 2010  . colostomy bag     Confirm with patient. Listed under medical conditions on form dated 07/18/09.  [name] COLOSTOMY REVERSAL  02-01-12  . EYE SURGERY     b/l cataracts and b/l blepharplasty  . [place]  . GASTRIC BYPASS  2003   Patient also noted "gastric bypass resection - 2004"  . HERNIA REPAIR  2009   Two hernias and abdominal reconstruction  . ivc filter     prophyllactically- no hx DVT  . LUMBAR DISC SURGERY     L1-L3 all had surgery most recently in 2017  . LUMBAR DISC SURGERY     L3-L4, Dr. Trenton Gammon  . pannilectomy     Per medical history form dated 07/18/09.  [name] TOTAL KNEE ARTHROPLASTY  02/11/2012   Procedure: TOTAL KNEE ARTHROPLASTY;  Surgeon: Mauri Pole, MD;  Location: [facility];  Service: Orthopedics;  Laterality: Right;  . ULNAR NERVE REPAIR Left 07/18/2016   by Dr.Henry Pool  . VULVA SURGERY  02-01-12   cyst removal-pt 8 months pregnant    SOCIAL HISTORY: Social History   Tobacco Use  . Smoking status: Never Smoker  . Smokeless tobacco: Never Used  Substance Use Topics  . Alcohol use: No    Alcohol/week: 0.0 standard drinks    Comment: special occasions  . Drug use: No    FAMILY HISTORY: Family History  Problem Relation Age of Onset  . Alcohol abuse Mother   . Other Mother        accidental med overdose  . Emphysema Mother        smoked  . Bipolar disorder Mother   . Sudden death Mother   . Liver disease Mother   . Other Father        CHF  . Diabetes Father   . Cancer Father  throat ca/ smoked  . Alcohol abuse Father   . Stroke Father   . Hypertension Father   . Liver disease Father   . Hypertension Brother   . Hyperlipidemia Brother   . Obesity Brother   . Heart attack Maternal Grandfather   . Heart attack Paternal Grandmother   . Heart attack Paternal Grandfather   . Arthritis Son        psoriatic  .  Arthritis Son        psoriatic  . Obesity Son     ROS: Review of Systems  Constitutional: Positive for weight loss.  Gastrointestinal: Negative for nausea and vomiting.  Musculoskeletal:       Negative muscle weakness  Endo/Heme/Allergies:       Positive polyphagia Negative hypoglycemia    PHYSICAL EXAM: Blood pressure (!) 87/46, pulse 64, temperature 98.5 F (36.9 C), temperature source Oral, height 4\' 11"  (1.499 m), weight 178 lb (80.7 kg), SpO2 99 %. Body mass index is 35.95 kg/m. Physical Exam Vitals signs reviewed.  Constitutional:      Appearance: Normal appearance. She is obese.  Cardiovascular:     Rate and Rhythm: Normal rate.     Pulses: Normal pulses.  Pulmonary:     Effort: Pulmonary effort is normal.     Breath sounds: Normal breath sounds.  Musculoskeletal: Normal range of motion.  Skin:    General: Skin is warm and dry.  Neurological:     Mental Status: She is alert and oriented to person, place, and time.  Psychiatric:        Mood and Affect: Mood normal.        Behavior: Behavior normal.     RECENT LABS AND TESTS: BMET    Component Value Date/Time   NA 144 01/29/2018 1001   K 3.9 01/29/2018 1001   CL 105 01/29/2018 1001   CO2 23 01/29/2018 1001   GLUCOSE 83 01/29/2018 1001   GLUCOSE 105 (H) 07/10/2017 1835   BUN 16 01/29/2018 1001   CREATININE 0.67 01/29/2018 1001   CREATININE 0.83 11/28/2012 1153   CALCIUM 9.0 01/29/2018 1001   GFRNONAA 89 01/29/2018 1001   GFRAA 103 01/29/2018 1001   Lab Results  Component Value Date   HGBA1C 5.7 (H) 01/29/2018   HGBA1C 5.9 07/03/2017   HGBA1C 5.9 09/06/2016   HGBA1C 5.8 03/14/2016   HGBA1C 5.6 08/22/2015   Lab Results  Component Value Date   INSULIN 4.3 01/29/2018   CBC    Component Value Date/Time   WBC 7.0 01/29/2018 1001   WBC 13.0 (H) 07/10/2017 1835   RBC 3.84 01/29/2018 1001   RBC 3.88 07/10/2017 1835   HGB 10.8 (L) 01/29/2018 1001   HGB 12.4 02/19/2011 1425   HCT 35.0  01/29/2018 1001   HCT 37.1 02/19/2011 1425   PLT 203 07/10/2017 1835   PLT 220 02/19/2011 1425   MCV 91 01/29/2018 1001   MCV 93.7 02/19/2011 1425   MCH 28.1 01/29/2018 1001   MCH 28.6 07/10/2017 1835   MCHC 30.9 (L) 01/29/2018 1001   MCHC 32.2 07/10/2017 1835   RDW 15.0 01/29/2018 1001   RDW 13.7 02/19/2011 1425   LYMPHSABS 1.8 01/29/2018 1001   LYMPHSABS 1.1 02/19/2011 1425   MONOABS 1.1 (H) 07/10/2017 1835   MONOABS 0.5 02/19/2011 1425   EOSABS 0.5 (H) 01/29/2018 1001   BASOSABS 0.1 01/29/2018 1001   BASOSABS 0.0 02/19/2011 1425   Iron/TIBC/Ferritin/ %Sat    Component Value Date/Time   IRON  66 02/04/2015 0738   TIBC 292 02/19/2011 1425   FERRITIN 15.5 08/05/2014 0945   IRONPCTSAT 17.3 (L) 02/04/2015 0738   IRONPCTSAT 17 (L) 02/19/2011 1425   Lipid Panel     Component Value Date/Time   CHOL 114 01/29/2018 1001   TRIG 78 01/29/2018 1001   HDL 58 01/29/2018 1001   CHOLHDL 2 07/03/2017 1550   VLDL 13.2 07/03/2017 1550   LDLCALC 40 01/29/2018 1001   Hepatic Function Panel     Component Value Date/Time   PROT 6.3 01/29/2018 1001   ALBUMIN 4.2 01/29/2018 1001   AST 29 01/29/2018 1001   ALT 22 01/29/2018 1001   ALKPHOS 103 01/29/2018 1001   BILITOT 0.4 01/29/2018 1001   BILIDIR 0.1 06/01/2013 0745   IBILI 0.6 02/02/2013 0850      Component Value Date/Time   TSH 1.900 01/29/2018 1001   TSH 2.99 09/06/2016 0751   TSH 3.21 03/14/2016 0757      OBESITY BEHAVIORAL INTERVENTION VISIT  Today's visit was # 2   Starting weight: 186 lbs Starting date: 01/28/2018 Today's weight : 178 lbs  Today's date: 02/11/2018 Total lbs lost to date: 8 At least 15 minutes were spent on discussing the following behavioral intervention visit.   ASK: We discussed the diagnosis of obesity with [name] today and [name] agreed to give [place] permission to discuss obesity behavioral modification therapy today.  ASSESS: [name] has the diagnosis of obesity and her BMI today is  35.93 [name] is in the action stage of change   ADVISE: [name] was educated on the multiple health risks of obesity as well as the benefit of weight loss to improve her health. She was advised of the need for long term treatment and the importance of lifestyle modifications to improve her current health and to decrease her risk of future health problems.  AGREE: Multiple dietary modification options and treatment options were discussed and  [name] agreed to follow the recommendations documented in the above note.  ARRANGE: [name] was educated on the importance of frequent visits to treat obesity as outlined per CMS and USPSTF guidelines and agreed to schedule her next follow up appointment today.  I, Trixie Dredge, am acting as transcriptionist for Dennard Nip, MD  I have reviewed the above documentation for accuracy and completeness, and I agree with the above. -Dennard Nip, MD

## 2018-02-14 DIAGNOSIS — I350 Nonrheumatic aortic (valve) stenosis: Secondary | ICD-10-CM | POA: Diagnosis not present

## 2018-02-14 DIAGNOSIS — I10 Essential (primary) hypertension: Secondary | ICD-10-CM | POA: Diagnosis not present

## 2018-02-14 DIAGNOSIS — R55 Syncope and collapse: Secondary | ICD-10-CM | POA: Diagnosis not present

## 2018-02-14 DIAGNOSIS — R0602 Shortness of breath: Secondary | ICD-10-CM | POA: Diagnosis not present

## 2018-02-20 ENCOUNTER — Other Ambulatory Visit: Payer: Self-pay | Admitting: Family Medicine

## 2018-02-24 DIAGNOSIS — N3642 Intrinsic sphincter deficiency (ISD): Secondary | ICD-10-CM | POA: Diagnosis not present

## 2018-02-24 DIAGNOSIS — I10 Essential (primary) hypertension: Secondary | ICD-10-CM | POA: Diagnosis not present

## 2018-02-24 DIAGNOSIS — N3946 Mixed incontinence: Secondary | ICD-10-CM | POA: Diagnosis not present

## 2018-02-24 DIAGNOSIS — N393 Stress incontinence (female) (male): Secondary | ICD-10-CM | POA: Diagnosis not present

## 2018-02-27 ENCOUNTER — Ambulatory Visit (INDEPENDENT_AMBULATORY_CARE_PROVIDER_SITE_OTHER): Payer: Medicare Other | Admitting: Family Medicine

## 2018-02-27 ENCOUNTER — Encounter (INDEPENDENT_AMBULATORY_CARE_PROVIDER_SITE_OTHER): Payer: Self-pay | Admitting: Family Medicine

## 2018-02-27 VITALS — BP 95/55 | HR 68 | Temp 98.6°F | Ht 59.0 in | Wt 179.0 lb

## 2018-02-27 DIAGNOSIS — E559 Vitamin D deficiency, unspecified: Secondary | ICD-10-CM

## 2018-02-27 DIAGNOSIS — R7303 Prediabetes: Secondary | ICD-10-CM

## 2018-02-27 DIAGNOSIS — Z6836 Body mass index (BMI) 36.0-36.9, adult: Secondary | ICD-10-CM

## 2018-02-27 NOTE — Progress Notes (Signed)
Office: [phone]  /  Fax: [phone]   HPI:   Chief Complaint: OBESITY [name] is here to discuss her progress with her obesity treatment plan. She is on the Category 2 plan +100 calories and is following her eating plan approximately 90 % of the time. She states she is walks the dog 30 minutes 7 times per week. [name] is status post duodenal switch (2003) at [facility]. Her highest weight was 307 lbs and her lowest was 117 lbs. She was unable to eat all food on plan. She has breakfast or only has yogurt. Her husband is a patient and is relying on her to make sure he sticks to plan. Her weight is 179 lb (81.2 kg) today and has gained 1 lbs since her last visit. She has lost 7 lbs since starting treatment with [place].  Pre-Diabetes [name] has a diagnosis of prediabetes based on her elevated Hgb A1c and was informed this puts her at greater risk of developing diabetes. She is not taking metformin currently and continues to work on diet and exercise to decrease risk of diabetes. She denies nausea or hypoglycemia. [name] was diabetic before her duodenal switch. Her last A1C was 5.7. She denies polyphagia.  Vitamin D deficiency [name] has a diagnosis of vitamin D deficiency. She is currently taking prescription Vit D and reports it is making her nauseas but denies vomiting or muscle weakness. She reports that she has always had nausea with Vit D supplements and does not absorb Vit D well.  ASSESSMENT AND PLAN:  Prediabetes  Vitamin D deficiency  Class 2 severe obesity with serious comorbidity and body mass index (BMI) of 36.0 to 36.9 in adult, unspecified obesity type ([name])  PLAN:  Pre-Diabetes [name] will continue to work on weight loss, exercise, and decreasing simple carbohydrates in her diet to help decrease the risk of diabetes. We dicussed metformin including benefits and risks.  [name] declined metformin for now and a prescription was not written today.  [name] agrees to continue with  her meal plan and follow up with our clinic in 2 weeks.  Vitamin D Deficiency [name] was informed that low vitamin D levels contributes to fatigue and are associated with obesity, breast, and colon cancer. She agrees to continue to take prescription Vit D @50 ,000 IU every 3 days #10 with no refills and will follow up for routine testing of vitamin D, at least 2-3 times per year. [name] is encouraged to take the Vit D at night with food. [name] agrees to follow up with our clinic in 2 weeks.  Obesity [name] is currently in the action stage of change. As such, her goal is to continue with weight loss efforts She has agreed to keep a food journal with 1100-1200 calories and 85 grams of protein daily or follow the Category 2 plan [name] has been instructed to continue to walk the dog for 30 minutes 7 times per week for weight loss and overall health benefits. We discussed the following Behavioral Modification Strategies today: increasing lean protein intake, no skipping meals and planning for success. Handouts were given, (Breakfast and Lunch options), (Journaling). She was given protein oatmeal option at breakfast. She was instructed to eat all food on her plan.  [name] has agreed to follow up with our clinic in 2 weeks. She was informed of the importance of frequent follow up visits to maximize her success with intensive lifestyle modifications for her multiple health conditions.  ALLERGIES: Allergies  Allergen Reactions  .  Hydrocodone Nausea And Vomiting     nausea and vomiting and headaches  . Myrbetriq [Mirabegron] Hives    Hives and itching  . Advil [Ibuprofen] Hives and Itching  . Amoxicillin Hives  . Cephalexin      Neuropathy  . Codeine     Crazy in the head, bp drops  . Levofloxacin Itching  . Lyrica [Pregabalin] Other (See Comments)    "Makes me like a zombie. I'm awake but I'm in slow motion."  . Meperidine Hcl     B/P drops  . Morphine And Related      States" extreme migraines"  .  Oxycodone-Acetaminophen Other (See Comments)    Crazy in head, drop in bp  . Oxycodone-Acetaminophen Other (See Comments)  . Sulfonamide Derivatives Nausea And Vomiting    Stomach upset  . Valsartan-Hydrochlorothiazide Other (See Comments)    Headaches  . Adhesive [Tape] Rash    Latex in adhesive tape. Please use paper tape  . Erythromycin Diarrhea and Rash    Other reaction(s): GI Intolerance  . Penicillins Nausea And Vomiting and Rash    MEDICATIONS: Current Outpatient Medications on File Prior to Visit  Medication Sig Dispense Refill  . ALPRAZolam (XANAX) 0.25 MG tablet Take 1 tablet (0.25 mg total) by mouth daily as needed. For anxiety 30 tablet 3  . aspirin 81 MG tablet Take 81 mg by mouth daily.    . bisacodyl (DULCOLAX) 5 MG EC tablet Take 5 mg by mouth daily as needed for moderate constipation.    . conjugated estrogens (PREMARIN) vaginal cream Place 1 Applicatorful vaginally. 3 times a week    . DULoxetine (CYMBALTA) 60 MG capsule TAKE 1 CAPSULE BY MOUTH ONCE DAILY 90 capsule 1  . fluticasone (FLONASE) 50 MCG/ACT nasal spray 2 sprays each nostril once or twice daily as neededs 16 g 12  . hydrOXYzine (ATARAX/VISTARIL) 10 MG tablet Take 1-2 tablets (10-20 mg total) by mouth 3 (three) times daily as needed for itching. 30 tablet 1  . levocetirizine (XYZAL) 5 MG tablet Take 5 mg by mouth every evening.    . Magnesium 250 MG TABS Take 1 tablet by mouth daily.    . methylphenidate (RITALIN) 10 MG tablet Take 1 tablet (10 mg total) by mouth as needed. 60 tablet 0  . Multiple Vitamins-Minerals (ICAPS AREDS 2 PO) Take 2 tablets by mouth daily.    . NON FORMULARY CPAP- set on 12 [employer].    [name] omeprazole (PRILOSEC) 20 MG capsule Take 20 mg by mouth daily.    . OPTIMAL-D 31540 units capsule TAKE 2 CAPSULES BY MOUTH ONCE A WEEK 8 capsule 4  . rosuvastatin (CRESTOR) 20 MG tablet Take 1 tablet (20 mg total) by mouth daily. 90 tablet 1  . traMADol (ULTRAM) 50 MG tablet Take 50 mg by mouth  every 6 (six) hours as needed.    . traZODone (DESYREL) 100 MG tablet TAKE 1/2 TO 1 (ONE-HALF TO ONE) TABLET BY MOUTH ONCE DAILY AS NEEDED FOR SLEEP 90 tablet 0  . valsartan (DIOVAN) 40 MG tablet Take 40 mg by mouth daily.    . Vitamin D, Ergocalciferol, (DRISDOL) 1.25 MG (50000 UT) CAPS capsule Take 1 capsule (50,000 Units total) by mouth every 3 (three) days. 10 capsule 0   No current facility-administered medications on file prior to visit.     PAST MEDICAL HISTORY: Past Medical History:  Diagnosis Date  . Allergy    rhinitis  . Anemia 02-01-12   iron absorption problem  .  Arthritis 02-01-12   osteoarthritis, Back pain, spine surgeries ? retain hardware cervial and  lumbar.  . Asthma   . Back pain   . Cancer ([place]) 02-01-12   squamous cell -face  . Depression with anxiety 01/08/2011  . Diabetes ([place])   . Dyspnea   . Esophageal reflux   . Fibromyalgia   . Gallbladder problem   . Generalized headaches   . Heart murmur   . HLD (hyperlipidemia)   . HTN (hypertension)   . Hyperparathyroidism ([place]) 08/18/2014  . Joint pain   . Knee pain, right 09/20/2011  . Motion sickness 12/24/2011  . Nausea & vomiting 12/24/2011  . Nerve pain   . Obesity   . OSA (obstructive sleep apnea)   . Osteoarthritis   . Osteopenia 08/05/2014  . Panic attacks   . Pelvic floor dysfunction 09/02/2016  . Sleep apnea, obstructive   . Stress incontinence   . Varicose veins of lower limb 10/02/2011  . Vasovagal syncope 11/22/2017  . Vitamin D deficiency     PAST SURGICAL HISTORY: Past Surgical History:  Procedure Laterality Date  . ABDOMINAL SURGERY  02-01-12   ABDOMINAL SURGERY  . APPENDECTOMY  1962  . BARIATRIC SURGERY    . BLADDER SUSPENSION     complicated by bowel nick/ clolostomy  . BLEPHAROPLASTY Bilateral   . CARPAL TUNNEL RELEASE  02-01-12   right  . CATARACT EXTRACTION Bilateral   . CERVICAL DISC SURGERY     C-7 in 2004, C-4 and C-5 in 2010  . colostomy bag     Confirm with patient.  Listed under medical conditions on form dated 07/18/09.  [name] COLOSTOMY REVERSAL  02-01-12  . EYE SURGERY     b/l cataracts and b/l blepharplasty  . [place]  . GASTRIC BYPASS  2003   Patient also noted "gastric bypass resection - 2004"  . HERNIA REPAIR  2009   Two hernias and abdominal reconstruction  . ivc filter     prophyllactically- no hx DVT  . LUMBAR DISC SURGERY     L1-L3 all had surgery most recently in 2017  . LUMBAR DISC SURGERY     L3-L4, Dr. Trenton Gammon  . pannilectomy     Per medical history form dated 07/18/09.  [name] TOTAL KNEE ARTHROPLASTY  02/11/2012   Procedure: TOTAL KNEE ARTHROPLASTY;  Surgeon: Mauri Pole, MD;  Location: [facility];  Service: Orthopedics;  Laterality: Right;  . ULNAR NERVE REPAIR Left 07/18/2016   by Dr.Henry Pool  . VULVA SURGERY  02-01-12   cyst removal-pt 8 months pregnant    SOCIAL HISTORY: Social History   Tobacco Use  . Smoking status: Never Smoker  . Smokeless tobacco: Never Used  Substance Use Topics  . Alcohol use: No    Alcohol/week: 0.0 standard drinks    Comment: special occasions  . Drug use: No    FAMILY HISTORY: Family History  Problem Relation Age of Onset  . Alcohol abuse Mother   . Other Mother        accidental med overdose  . Emphysema Mother        smoked  . Bipolar disorder Mother   . Sudden death Mother   . Liver disease Mother   . Other Father        CHF  . Diabetes Father   . Cancer Father        throat ca/ smoked  . Alcohol abuse Father   . Stroke Father   .  Hypertension Father   . Liver disease Father   . Hypertension Brother   . Hyperlipidemia Brother   . Obesity Brother   . Heart attack Maternal Grandfather   . Heart attack Paternal Grandmother   . Heart attack Paternal Grandfather   . Arthritis Son        psoriatic  . Arthritis Son        psoriatic  . Obesity Son     ROS: Review of Systems  Constitutional: Negative for weight loss.  Gastrointestinal: Positive for nausea.  Negative for vomiting.  Genitourinary:       Negative for polyuria  Musculoskeletal:       Negative for muscle weakness  Endo/Heme/Allergies:       Negative for hypoglycemia Negative for polyphagia    PHYSICAL EXAM: Blood pressure (!) 95/55, pulse 68, temperature 98.6 F (37 C), temperature source Oral, height 4\' 11"  (1.499 m), weight 179 lb (81.2 kg), SpO2 95 %. Body mass index is 36.15 kg/m. Physical Exam Vitals signs reviewed.  Constitutional:      Appearance: Normal appearance. She is obese.  Cardiovascular:     Rate and Rhythm: Normal rate.     Pulses: Normal pulses.  Pulmonary:     Effort: Pulmonary effort is normal.  Musculoskeletal: Normal range of motion.  Skin:    General: Skin is warm and dry.  Neurological:     Mental Status: She is alert and oriented to person, place, and time.  Psychiatric:        Mood and Affect: Mood normal.        Behavior: Behavior normal.     RECENT LABS AND TESTS: BMET    Component Value Date/Time   NA 144 01/29/2018 1001   K 3.9 01/29/2018 1001   CL 105 01/29/2018 1001   CO2 23 01/29/2018 1001   GLUCOSE 83 01/29/2018 1001   GLUCOSE 105 (H) 07/10/2017 1835   BUN 16 01/29/2018 1001   CREATININE 0.67 01/29/2018 1001   CREATININE 0.83 11/28/2012 1153   CALCIUM 9.0 01/29/2018 1001   GFRNONAA 89 01/29/2018 1001   GFRAA 103 01/29/2018 1001   Lab Results  Component Value Date   HGBA1C 5.7 (H) 01/29/2018   HGBA1C 5.9 07/03/2017   HGBA1C 5.9 09/06/2016   HGBA1C 5.8 03/14/2016   HGBA1C 5.6 08/22/2015   Lab Results  Component Value Date   INSULIN 4.3 01/29/2018   CBC    Component Value Date/Time   WBC 7.0 01/29/2018 1001   WBC 13.0 (H) 07/10/2017 1835   RBC 3.84 01/29/2018 1001   RBC 3.88 07/10/2017 1835   HGB 10.8 (L) 01/29/2018 1001   HGB 12.4 02/19/2011 1425   HCT 35.0 01/29/2018 1001   HCT 37.1 02/19/2011 1425   PLT 203 07/10/2017 1835   PLT 220 02/19/2011 1425   MCV 91 01/29/2018 1001   MCV 93.7 02/19/2011  1425   MCH 28.1 01/29/2018 1001   MCH 28.6 07/10/2017 1835   MCHC 30.9 (L) 01/29/2018 1001   MCHC 32.2 07/10/2017 1835   RDW 15.0 01/29/2018 1001   RDW 13.7 02/19/2011 1425   LYMPHSABS 1.8 01/29/2018 1001   LYMPHSABS 1.1 02/19/2011 1425   MONOABS 1.1 (H) 07/10/2017 1835   MONOABS 0.5 02/19/2011 1425   EOSABS 0.5 (H) 01/29/2018 1001   BASOSABS 0.1 01/29/2018 1001   BASOSABS 0.0 02/19/2011 1425   Iron/TIBC/Ferritin/ %Sat    Component Value Date/Time   IRON 66 02/04/2015 0738   TIBC 292 02/19/2011 1425  FERRITIN 15.5 08/05/2014 0945   IRONPCTSAT 17.3 (L) 02/04/2015 0738   IRONPCTSAT 17 (L) 02/19/2011 1425   Lipid Panel     Component Value Date/Time   CHOL 114 01/29/2018 1001   TRIG 78 01/29/2018 1001   HDL 58 01/29/2018 1001   CHOLHDL 2 07/03/2017 1550   VLDL 13.2 07/03/2017 1550   LDLCALC 40 01/29/2018 1001   Hepatic Function Panel     Component Value Date/Time   PROT 6.3 01/29/2018 1001   ALBUMIN 4.2 01/29/2018 1001   AST 29 01/29/2018 1001   ALT 22 01/29/2018 1001   ALKPHOS 103 01/29/2018 1001   BILITOT 0.4 01/29/2018 1001   BILIDIR 0.1 06/01/2013 0745   IBILI 0.6 02/02/2013 0850      Component Value Date/Time   TSH 1.900 01/29/2018 1001   TSH 2.99 09/06/2016 0751   TSH 3.21 03/14/2016 0757    Ref. Range 01/29/2018 10:01  Vitamin D, 25-Hydroxy Latest Ref Range: 30.0 - 100.0 ng/mL 23.7 (L)    OBESITY BEHAVIORAL INTERVENTION VISIT  Today's visit was # 3   Starting weight: 186 lbs Starting date: 01/28/2018 Today's weight :179 lbs Today's date: 02/27/2018 Total lbs lost to date: 7 At least 15 minutes were spent on discussing the following behavioral intervention visit.   ASK: We discussed the diagnosis of obesity with [name] today and [name] agreed to give [place] permission to discuss obesity behavioral modification therapy today.  ASSESS: Laneah has the diagnosis of obesity and her BMI today is 36.13 [place] is in the action stage of change    ADVISE: Randy was educated on the multiple health risks of obesity as well as the benefit of weight loss to improve her health. She was advised of the need for long term treatment and the importance of lifestyle modifications to improve her current health and to decrease her risk of future health problems.  AGREE: Multiple dietary modification options and treatment options were discussed and  Claudett agreed to follow the recommendations documented in the above note.  ARRANGE: Jamell was educated on the importance of frequent visits to treat obesity as outlined per CMS and USPSTF guidelines and agreed to schedule her next follow up appointment today.  I, Tammy Wysor, am acting as Location manager for [employer], FNP-C.  I have reviewed the above documentation for accuracy and completeness, and I agree with the above.  -  , FNP-C.

## 2018-03-03 ENCOUNTER — Encounter (INDEPENDENT_AMBULATORY_CARE_PROVIDER_SITE_OTHER): Payer: Self-pay | Admitting: Family Medicine

## 2018-03-03 DIAGNOSIS — R7303 Prediabetes: Secondary | ICD-10-CM | POA: Insufficient documentation

## 2018-03-13 ENCOUNTER — Encounter (INDEPENDENT_AMBULATORY_CARE_PROVIDER_SITE_OTHER): Payer: Self-pay | Admitting: Family Medicine

## 2018-03-13 ENCOUNTER — Ambulatory Visit (INDEPENDENT_AMBULATORY_CARE_PROVIDER_SITE_OTHER): Payer: Medicare Other | Admitting: Family Medicine

## 2018-03-13 VITALS — BP 119/50 | HR 69 | Temp 98.1°F | Ht 59.0 in | Wt 175.0 lb

## 2018-03-13 DIAGNOSIS — R7303 Prediabetes: Secondary | ICD-10-CM | POA: Diagnosis not present

## 2018-03-13 DIAGNOSIS — E559 Vitamin D deficiency, unspecified: Secondary | ICD-10-CM | POA: Diagnosis not present

## 2018-03-13 DIAGNOSIS — Z6835 Body mass index (BMI) 35.0-35.9, adult: Secondary | ICD-10-CM

## 2018-03-13 MED ORDER — VITAMIN D (ERGOCALCIFEROL) 1.25 MG (50000 UNIT) PO CAPS: 50000.0000 [IU] | ORAL_CAPSULE | ORAL | 0 refills | Status: DC

## 2018-03-16 NOTE — Progress Notes (Signed)
Office: [phone]  /  Fax: [phone]   HPI:   Chief Complaint: OBESITY [name] is here to discuss her progress with her obesity treatment plan. She is on the Category 2 plan and is following her eating plan approximately 80-90 % of the time. She states she is walking for 30+ minutes 4-5 times per week. [name] has been staying on the plan well.  Her weight is 175 lb (79.4 kg) today and has had a weight loss of 4 pounds over a period of 2 weeks since her last visit. She has lost 11 lbs since starting treatment with [place].  Pre-Diabetes [name] has a diagnosis of pre-diabetes based on her elevated Hgb A1c and was informed this puts her at greater risk of developing diabetes. She was diabetic prior to duodenal switch surgery. She is not taking metformin currently and continues to work on diet and exercise to decrease risk of diabetes. She denies nausea or hypoglycemia. Lab Results  Component Value Date   HGBA1C 5.7 (H) 01/29/2018    Vitamin D Deficiency [name] has a diagnosis of vitamin D deficiency. She is currently taking prescription Vit D, but level is not at [facility]. Last Vit D level was 23.7 on 01/29/2018. She denies nausea, vomiting or muscle weakness.  ASSESSMENT AND PLAN:  Vitamin D deficiency - Plan: Vitamin D, Ergocalciferol, (DRISDOL) 1.25 MG (50000 UT) CAPS capsule  Prediabetes  Class 2 severe obesity with serious comorbidity and body mass index (BMI) of 35.0 to 35.9 in adult, unspecified obesity type ([place])  PLAN:  Pre-Diabetes [name] will continue her meal plan, and will continue to work on weight loss, exercise, and decreasing simple carbohydrates in her diet to help decrease the risk of diabetes. We dicussed metformin including benefits and risks. She was informed that eating too many simple carbohydrates or too many calories at one sitting increases the likelihood of GI side effects. Alveria declined metformin for now and a prescription was not written today. Kenzly agrees to follow up  with our clinic in 2 to 3 weeks as directed to monitor her progress.  Vitamin D Deficiency [name] was informed that low vitamin D levels contributes to fatigue and are associated with obesity, breast, and colon cancer. [name] agrees to continue taking prescription Vit D @50 ,000 IU every 3 days #10 and we will refill for 1 month. She will follow up for routine testing of vitamin D, at least 2-3 times per year. She was informed of the risk of over-replacement of vitamin D and agrees to not increase her dose unless she discusses this with [place] first. Deloras agrees to follow up with our clinic in 2 to 3 weeks.  Obesity [name] is currently in the action stage of change. As such, her goal is to continue with weight loss efforts She has agreed to keep a food journal with 1200-1300 calories and 85 grams of protein daily [name] will continue current exercise regimen for weight loss and overall health benefits. We discussed the following Behavioral Modification Strategies today: increasing lean protein intake, work on meal planning and easy cooking plans, planning for success, and keep a strict food journal I demonstrated My [employer]itness Pal.  [name] has agreed to follow up with our clinic in 2 to 3 weeks. She was informed of the importance of frequent follow up visits to maximize her success with intensive lifestyle modifications for her multiple health conditions.  ALLERGIES: Allergies  Allergen Reactions  . Hydrocodone Nausea And Vomiting     nausea and vomiting  and headaches  . Myrbetriq [Mirabegron] Hives    Hives and itching  . Advil [Ibuprofen] Hives and Itching  . Amoxicillin Hives  . Cephalexin      Neuropathy  . Codeine     Crazy in the head, bp drops  . Levofloxacin Itching  . Lyrica [Pregabalin] Other (See Comments)    "Makes me like a zombie. I'm awake but I'm in slow motion."  . Meperidine Hcl     B/P drops  . Morphine And Related      States" extreme migraines"  . Oxycodone-Acetaminophen Other  (See Comments)    Crazy in head, drop in bp  . Oxycodone-Acetaminophen Other (See Comments)  . Sulfonamide Derivatives Nausea And Vomiting    Stomach upset  . Valsartan-Hydrochlorothiazide Other (See Comments)    Headaches  . Adhesive [Tape] Rash    Latex in adhesive tape. Please use paper tape  . Erythromycin Diarrhea and Rash    Other reaction(s): GI Intolerance  . Penicillins Nausea And Vomiting and Rash    MEDICATIONS: Current Outpatient Medications on File Prior to Visit  Medication Sig Dispense Refill  . ALPRAZolam (XANAX) 0.25 MG tablet Take 1 tablet (0.25 mg total) by mouth daily as needed. For anxiety 30 tablet 3  . aspirin 81 MG tablet Take 81 mg by mouth daily.    . bisacodyl (DULCOLAX) 5 MG EC tablet Take 5 mg by mouth daily as needed for moderate constipation.    . conjugated estrogens (PREMARIN) vaginal cream Place 1 Applicatorful vaginally. 3 times a week    . DULoxetine (CYMBALTA) 60 MG capsule TAKE 1 CAPSULE BY MOUTH ONCE DAILY 90 capsule 1  . fluticasone (FLONASE) 50 MCG/ACT nasal spray 2 sprays each nostril once or twice daily as neededs 16 g 12  . hydrOXYzine (ATARAX/VISTARIL) 10 MG tablet Take 1-2 tablets (10-20 mg total) by mouth 3 (three) times daily as needed for itching. 30 tablet 1  . levocetirizine (XYZAL) 5 MG tablet Take 5 mg by mouth every evening.    . Magnesium 250 MG TABS Take 1 tablet by mouth daily.    . methylphenidate (RITALIN) 10 MG tablet Take 1 tablet (10 mg total) by mouth as needed. 60 tablet 0  . Multiple Vitamins-Minerals (ICAPS AREDS 2 PO) Take 2 tablets by mouth daily.    . NON FORMULARY CPAP- set on 12 [employer].    [name] omeprazole (PRILOSEC) 20 MG capsule Take 20 mg by mouth daily.    . OPTIMAL-D 92426 units capsule TAKE 2 CAPSULES BY MOUTH ONCE A WEEK 8 capsule 4  . rosuvastatin (CRESTOR) 20 MG tablet Take 1 tablet (20 mg total) by mouth daily. 90 tablet 1  . traMADol (ULTRAM) 50 MG tablet Take 50 mg by mouth every 6 (six) hours as needed.     . traZODone (DESYREL) 100 MG tablet TAKE 1/2 TO 1 (ONE-HALF TO ONE) TABLET BY MOUTH ONCE DAILY AS NEEDED FOR SLEEP 90 tablet 0  . valsartan (DIOVAN) 40 MG tablet Take 40 mg by mouth daily.     No current facility-administered medications on file prior to visit.     PAST MEDICAL HISTORY: Past Medical History:  Diagnosis Date  . Allergy    rhinitis  . Anemia 02-01-12   iron absorption problem  . Arthritis 02-01-12   osteoarthritis, Back pain, spine surgeries ? retain hardware cervial and  lumbar.  . Asthma   . Back pain   . Cancer ([place]) 02-01-12   squamous cell -face  .  Depression with anxiety 01/08/2011  . Diabetes ([place])   . Dyspnea   . Esophageal reflux   . Fibromyalgia   . Gallbladder problem   . Generalized headaches   . Heart murmur   . HLD (hyperlipidemia)   . HTN (hypertension)   . Hyperparathyroidism ([place]) 08/18/2014  . Joint pain   . Knee pain, right 09/20/2011  . Motion sickness 12/24/2011  . Nausea & vomiting 12/24/2011  . Nerve pain   . Obesity   . OSA (obstructive sleep apnea)   . Osteoarthritis   . Osteopenia 08/05/2014  . Panic attacks   . Pelvic floor dysfunction 09/02/2016  . Sleep apnea, obstructive   . Stress incontinence   . Varicose veins of lower limb 10/02/2011  . Vasovagal syncope 11/22/2017  . Vitamin D deficiency     PAST SURGICAL HISTORY: Past Surgical History:  Procedure Laterality Date  . ABDOMINAL SURGERY  02-01-12   ABDOMINAL SURGERY  . APPENDECTOMY  1962  . BARIATRIC SURGERY    . BLADDER SUSPENSION     complicated by bowel nick/ clolostomy  . BLEPHAROPLASTY Bilateral   . CARPAL TUNNEL RELEASE  02-01-12   right  . CATARACT EXTRACTION Bilateral   . CERVICAL DISC SURGERY     C-7 in 2004, C-4 and C-5 in 2010  . colostomy bag     Confirm with patient. Listed under medical conditions on form dated 07/18/09.  [name] COLOSTOMY REVERSAL  02-01-12  . EYE SURGERY     b/l cataracts and b/l blepharplasty  . [place]  . GASTRIC  BYPASS  2003   Patient also noted "gastric bypass resection - 2004"  . HERNIA REPAIR  2009   Two hernias and abdominal reconstruction  . ivc filter     prophyllactically- no hx DVT  . LUMBAR DISC SURGERY     L1-L3 all had surgery most recently in 2017  . LUMBAR DISC SURGERY     L3-L4, Dr. Trenton Gammon  . pannilectomy     Per medical history form dated 07/18/09.  [name] TOTAL KNEE ARTHROPLASTY  02/11/2012   Procedure: TOTAL KNEE ARTHROPLASTY;  Surgeon: Mauri Pole, MD;  Location: [facility];  Service: Orthopedics;  Laterality: Right;  . ULNAR NERVE REPAIR Left 07/18/2016   by Dr.Henry Pool  . VULVA SURGERY  02-01-12   cyst removal-pt 8 months pregnant    SOCIAL HISTORY: Social History   Tobacco Use  . Smoking status: Never Smoker  . Smokeless tobacco: Never Used  Substance Use Topics  . Alcohol use: No    Alcohol/week: 0.0 standard drinks    Comment: special occasions  . Drug use: No    FAMILY HISTORY: Family History  Problem Relation Age of Onset  . Alcohol abuse Mother   . Other Mother        accidental med overdose  . Emphysema Mother        smoked  . Bipolar disorder Mother   . Sudden death Mother   . Liver disease Mother   . Other Father        CHF  . Diabetes Father   . Cancer Father        throat ca/ smoked  . Alcohol abuse Father   . Stroke Father   . Hypertension Father   . Liver disease Father   . Hypertension Brother   . Hyperlipidemia Brother   . Obesity Brother   . Heart attack Maternal Grandfather   . Heart attack Paternal Grandmother   .  Heart attack Paternal Grandfather   . Arthritis Son        psoriatic  . Arthritis Son        psoriatic  . Obesity Son     ROS: Review of Systems  Constitutional: Positive for weight loss.  Gastrointestinal: Negative for nausea and vomiting.  Musculoskeletal:       Negative muscle weakness  Endo/Heme/Allergies:       Negative hypoglycemia    PHYSICAL EXAM: Blood pressure (!) 119/50, pulse 69, temperature 98.1  F (36.7 C), height 4\' 11"  (1.499 m), weight 175 lb (79.4 kg), SpO2 98 %. Body mass index is 35.35 kg/m. Physical Exam Vitals signs reviewed.  Constitutional:      Appearance: Normal appearance. She is obese.  Cardiovascular:     Rate and Rhythm: Normal rate.     Pulses: Normal pulses.  Pulmonary:     Effort: Pulmonary effort is normal.     Breath sounds: Normal breath sounds.  Musculoskeletal: Normal range of motion.  Skin:    General: Skin is warm and dry.  Neurological:     Mental Status: She is alert and oriented to person, place, and time.  Psychiatric:        Mood and Affect: Mood normal.        Behavior: Behavior normal.     RECENT LABS AND TESTS: BMET    Component Value Date/Time   NA 144 01/29/2018 1001   K 3.9 01/29/2018 1001   CL 105 01/29/2018 1001   CO2 23 01/29/2018 1001   GLUCOSE 83 01/29/2018 1001   GLUCOSE 105 (H) 07/10/2017 1835   BUN 16 01/29/2018 1001   CREATININE 0.67 01/29/2018 1001   CREATININE 0.83 11/28/2012 1153   CALCIUM 9.0 01/29/2018 1001   GFRNONAA 89 01/29/2018 1001   GFRAA 103 01/29/2018 1001   Lab Results  Component Value Date   HGBA1C 5.7 (H) 01/29/2018   HGBA1C 5.9 07/03/2017   HGBA1C 5.9 09/06/2016   HGBA1C 5.8 03/14/2016   HGBA1C 5.6 08/22/2015   Lab Results  Component Value Date   INSULIN 4.3 01/29/2018   CBC    Component Value Date/Time   WBC 7.0 01/29/2018 1001   WBC 13.0 (H) 07/10/2017 1835   RBC 3.84 01/29/2018 1001   RBC 3.88 07/10/2017 1835   HGB 10.8 (L) 01/29/2018 1001   HGB 12.4 02/19/2011 1425   HCT 35.0 01/29/2018 1001   HCT 37.1 02/19/2011 1425   PLT 203 07/10/2017 1835   PLT 220 02/19/2011 1425   MCV 91 01/29/2018 1001   MCV 93.7 02/19/2011 1425   MCH 28.1 01/29/2018 1001   MCH 28.6 07/10/2017 1835   MCHC 30.9 (L) 01/29/2018 1001   MCHC 32.2 07/10/2017 1835   RDW 15.0 01/29/2018 1001   RDW 13.7 02/19/2011 1425   LYMPHSABS 1.8 01/29/2018 1001   LYMPHSABS 1.1 02/19/2011 1425   MONOABS 1.1 (H)  07/10/2017 1835   MONOABS 0.5 02/19/2011 1425   EOSABS 0.5 (H) 01/29/2018 1001   BASOSABS 0.1 01/29/2018 1001   BASOSABS 0.0 02/19/2011 1425   Iron/TIBC/Ferritin/ %Sat    Component Value Date/Time   IRON 66 02/04/2015 0738   TIBC 292 02/19/2011 1425   FERRITIN 15.5 08/05/2014 0945   IRONPCTSAT 17.3 (L) 02/04/2015 0738   IRONPCTSAT 17 (L) 02/19/2011 1425   Lipid Panel     Component Value Date/Time   CHOL 114 01/29/2018 1001   TRIG 78 01/29/2018 1001   HDL 58 01/29/2018 1001   CHOLHDL  2 07/03/2017 1550   VLDL 13.2 07/03/2017 1550   LDLCALC 40 01/29/2018 1001   Hepatic Function Panel     Component Value Date/Time   PROT 6.3 01/29/2018 1001   ALBUMIN 4.2 01/29/2018 1001   AST 29 01/29/2018 1001   ALT 22 01/29/2018 1001   ALKPHOS 103 01/29/2018 1001   BILITOT 0.4 01/29/2018 1001   BILIDIR 0.1 06/01/2013 0745   IBILI 0.6 02/02/2013 0850      Component Value Date/Time   TSH 1.900 01/29/2018 1001   TSH 2.99 09/06/2016 0751   TSH 3.21 03/14/2016 0757      OBESITY BEHAVIORAL INTERVENTION VISIT  Today's visit was # 4   Starting weight: 186 lbs Starting date: 01/28/2018 Today's weight : 175 lbs  Today's date: 03/13/2018 Total lbs lost to date: 11 At least 15 minutes were spent on discussing the following behavioral intervention visit.   ASK: We discussed the diagnosis of obesity with Latiesha T Schooley today and Daiya agreed to give [place] permission to discuss obesity behavioral modification therapy today.  ASSESS: Tabor has the diagnosis of obesity and her BMI today is 35.33 Paulla is in the action stage of change   ADVISE: Khila was educated on the multiple health risks of obesity as well as the benefit of weight loss to improve her health. She was advised of the need for long term treatment and the importance of lifestyle modifications to improve her current health and to decrease her risk of future health problems.  AGREE: Multiple dietary modification options and  treatment options were discussed and  Leyani agreed to follow the recommendations documented in the above note.  ARRANGE: Dajanay was educated on the importance of frequent visits to treat obesity as outlined per CMS and USPSTF guidelines and agreed to schedule her next follow up appointment today.  Wilhemena Durie, am acting as Location manager for [employer], FNP-C.  I have reviewed the above documentation for accuracy and completeness, and I agree with the above.  -  , FNP-C.

## 2018-03-18 ENCOUNTER — Encounter (INDEPENDENT_AMBULATORY_CARE_PROVIDER_SITE_OTHER): Payer: Self-pay | Admitting: Family Medicine

## 2018-04-02 ENCOUNTER — Encounter (INDEPENDENT_AMBULATORY_CARE_PROVIDER_SITE_OTHER): Payer: Self-pay | Admitting: Family Medicine

## 2018-04-02 ENCOUNTER — Other Ambulatory Visit: Payer: Self-pay

## 2018-04-02 ENCOUNTER — Ambulatory Visit (INDEPENDENT_AMBULATORY_CARE_PROVIDER_SITE_OTHER): Payer: Medicare Other | Admitting: Family Medicine

## 2018-04-02 VITALS — BP 109/64 | HR 60 | Temp 98.4°F | Ht 59.0 in | Wt 174.0 lb

## 2018-04-02 DIAGNOSIS — R7303 Prediabetes: Secondary | ICD-10-CM | POA: Diagnosis not present

## 2018-04-02 DIAGNOSIS — E559 Vitamin D deficiency, unspecified: Secondary | ICD-10-CM | POA: Diagnosis not present

## 2018-04-02 DIAGNOSIS — Z6835 Body mass index (BMI) 35.0-35.9, adult: Secondary | ICD-10-CM | POA: Diagnosis not present

## 2018-04-02 DIAGNOSIS — F3289 Other specified depressive episodes: Secondary | ICD-10-CM

## 2018-04-02 MED ORDER — VITAMIN D (ERGOCALCIFEROL) 1.25 MG (50000 UNIT) PO CAPS: 50000.0000 [IU] | ORAL_CAPSULE | ORAL | 0 refills | Status: DC

## 2018-04-02 MED ORDER — BUPROPION HCL ER (SR) 150 MG PO TB12: 150.0000 mg | ORAL_TABLET | Freq: Every day | ORAL | 0 refills | Status: DC

## 2018-04-02 NOTE — Progress Notes (Signed)
Office: [phone]  /  Fax: [phone]   HPI:   Chief Complaint: OBESITY [name] is here to discuss her progress with her obesity treatment plan. She is eating 1200-1300 calories and 85 grams of protein daily and is following her eating plan approximately 80% of the time. She states she is doing yard work and walking 30 minutes 3-4 times per week. [name] reports she has been traveling and has been slightly off the plan and states she is not getting in her protein daily. She states she is not using My Fitness Pal but is keeping track of her calories and protein in her head. Her weight is 174 lb (78.9 kg) today and has had a weight loss of 1 pound over a period of 3 weeks since her last visit. She has lost 12 lbs since starting treatment with [place].  Vitamin D deficiency [name] has a diagnosis of Vitamin D deficiency and is currently not at goal. Her last Vitamin D level was reported to be 23.7 on 01/29/2018. She is currently taking prescription Vit D and denies nausea, vomiting or muscle weakness.  Pre-Diabetes [name] has a diagnosis of prediabetes based on her elevated Hgb A1c.[name] She was diabetic before duodenal switch surgery. Her A1c was reported to be 5.7 on 01/29/2018. She is not taking metformin currently and continues to work on diet and exercise to decrease risk of diabetes. She reports positive cravings.  Depression with emotional eating behaviors [name] is struggling with emotional eating and using food for comfort to the extent that it is negatively impacting her health. [name] reports frequent food cravings and states she experiences when she is out and about and sees triggers such as pizza.  She often snacks when she is not hungry. [name] sometimes feels she is out of control and then feels guilty that she made poor food choices. She has been working on behavior modification techniques to help reduce her emotional eating and has been somewhat successful. She shows no sign of suicidal or homicidal  ideations.  Depression screen [name] 2/9 01/29/2018 11/08/2017 08/23/2016 08/22/2015 08/05/2014  Decreased Interest 3 0 0 1 0  Down, Depressed, Hopeless 2 0 0 1 0  PHQ - 2 Score 5 0 0 2 0  Altered sleeping 3 - - 0 -  Tired, decreased energy 3 - - 2 -  Change in appetite 1 - - 0 -  Feeling bad or failure about yourself  1 - - 2 -  Trouble concentrating 1 - - 0 -  Moving slowly or fidgety/restless 3 - - 0 -  Suicidal thoughts 0 - - 0 -  PHQ-9 Score 17 - - 6 -  Difficult doing work/chores Somewhat difficult - - Somewhat difficult -  Some recent data might be hidden   ASSESSMENT AND PLAN:  Vitamin D deficiency - Plan: Vitamin D, Ergocalciferol, (DRISDOL) 1.25 MG (50000 UT) CAPS capsule  Prediabetes - Plan: buPROPion (WELLBUTRIN SR) 150 MG 12 hr tablet  Other depression - with emotional eating  Class 2 severe obesity with serious comorbidity and body mass index (BMI) of 35.0 to 35.9 in adult, unspecified obesity type (HCC)  PLAN:  Vitamin D Deficiency [name] was informed that low Vitamin D levels contributes to fatigue and are associated with obesity, breast, and colon cancer. She agrees to continue to take prescription Vit D @ 50,000 IU every three days #10 with 0 refills and will follow-up for routine testing of Vitamin D, at least 2-3 times per year. She was  informed of the risk of over-replacement of Vitamin D and agrees to not increase her dose unless she discusses this with [place] first. [name] agrees to follow-up with our clinic in 3 weeks.  Pre-Diabetes [name] will continue to work on weight loss, exercise, and decreasing simple carbohydrates in her diet to help decrease the risk of diabetes.  [name] will continue her meal plan and agrees to follow-up with [place] as directed to monitor her progress.  Depression with Emotional Eating Behaviors We discussed behavior modification techniques today to help [name] deal with her emotional eating and depression. [name] was given a new RX for bupropion SR 150 mg  qam #30 with 0 refills. She agrees to follow-up with our clinic in 3 weeks.  Obesity [name] is currently in the action stage of change. As such, her goal is to continue with weight loss efforts. She has agreed to follow the Category 2 plan or journal 1200-1300 calories + 85 grams of protein daily. [name] has been instructed to continue her exercise regimen as noted above. We discussed the following Behavioral Modification Strategies today: increasing lean protein intake and planning for success.  [name] has agreed to follow-up with our clinic in 3 weeks. She was informed of the importance of frequent follow-up visits to maximize her success with intensive lifestyle modifications for her multiple health conditions.  ALLERGIES: Allergies  Allergen Reactions  . Hydrocodone Nausea And Vomiting     nausea and vomiting and headaches  . Myrbetriq [Mirabegron] Hives    Hives and itching  . Advil [Ibuprofen] Hives and Itching  . Amoxicillin Hives  . Cephalexin      Neuropathy  . Codeine     Crazy in the head, bp drops  . Levofloxacin Itching  . Lyrica [Pregabalin] Other (See Comments)    "Makes me like a zombie. I'm awake but I'm in slow motion."  . Meperidine Hcl     B/P drops  . Morphine And Related      States" extreme migraines"  . Oxycodone-Acetaminophen Other (See Comments)    Crazy in head, drop in bp  . Oxycodone-Acetaminophen Other (See Comments)  . Sulfonamide Derivatives Nausea And Vomiting    Stomach upset  . Valsartan-Hydrochlorothiazide Other (See Comments)    Headaches  . Adhesive [Tape] Rash    Latex in adhesive tape. Please use paper tape  . Erythromycin Diarrhea and Rash    Other reaction(s): GI Intolerance  . Penicillins Nausea And Vomiting and Rash    MEDICATIONS: Current Outpatient Medications on File Prior to Visit  Medication Sig Dispense Refill  . ALPRAZolam (XANAX) 0.25 MG tablet Take 1 tablet (0.25 mg total) by mouth daily as needed. For anxiety 30 tablet 3   . aspirin 81 MG tablet Take 81 mg by mouth daily.    . bisacodyl (DULCOLAX) 5 MG EC tablet Take 5 mg by mouth daily as needed for moderate constipation.    . conjugated estrogens (PREMARIN) vaginal cream Place 1 Applicatorful vaginally. 3 times a week    . DULoxetine (CYMBALTA) 60 MG capsule TAKE 1 CAPSULE BY MOUTH ONCE DAILY 90 capsule 1  . fluticasone (FLONASE) 50 MCG/ACT nasal spray 2 sprays each nostril once or twice daily as neededs 16 g 12  . hydrOXYzine (ATARAX/VISTARIL) 10 MG tablet Take 1-2 tablets (10-20 mg total) by mouth 3 (three) times daily as needed for itching. 30 tablet 1  . levocetirizine (XYZAL) 5 MG tablet Take 5 mg by mouth every evening.    . Magnesium  250 MG TABS Take 1 tablet by mouth daily.    . methylphenidate (RITALIN) 10 MG tablet Take 1 tablet (10 mg total) by mouth as needed. 60 tablet 0  . Multiple Vitamins-Minerals (ICAPS AREDS 2 PO) Take 2 tablets by mouth daily.    . NON FORMULARY CPAP- set on 12 [employer].    [name] omeprazole (PRILOSEC) 20 MG capsule Take 20 mg by mouth daily.    . OPTIMAL-D 67124 units capsule TAKE 2 CAPSULES BY MOUTH ONCE A WEEK 8 capsule 4  . rosuvastatin (CRESTOR) 20 MG tablet Take 1 tablet (20 mg total) by mouth daily. 90 tablet 1  . traMADol (ULTRAM) 50 MG tablet Take 50 mg by mouth every 6 (six) hours as needed.    . traZODone (DESYREL) 100 MG tablet TAKE 1/2 TO 1 (ONE-HALF TO ONE) TABLET BY MOUTH ONCE DAILY AS NEEDED FOR SLEEP 90 tablet 0  . valsartan (DIOVAN) 40 MG tablet Take 40 mg by mouth daily.    . Vitamin D, Ergocalciferol, (DRISDOL) 1.25 MG (50000 UT) CAPS capsule Take 1 capsule (50,000 Units total) by mouth every 3 (three) days. 10 capsule 0   No current facility-administered medications on file prior to visit.     PAST MEDICAL HISTORY: Past Medical History:  Diagnosis Date  . Allergy    rhinitis  . Anemia 02-01-12   iron absorption problem  . Arthritis 02-01-12   osteoarthritis, Back pain, spine surgeries ? retain hardware  cervial and  lumbar.  . Asthma   . Back pain   . Cancer ([place]) 02-01-12   squamous cell -face  . Depression with anxiety 01/08/2011  . Diabetes ([place])   . Dyspnea   . Esophageal reflux   . Fibromyalgia   . Gallbladder problem   . Generalized headaches   . Heart murmur   . HLD (hyperlipidemia)   . HTN (hypertension)   . Hyperparathyroidism ([place]) 08/18/2014  . Joint pain   . Knee pain, right 09/20/2011  . Motion sickness 12/24/2011  . Nausea & vomiting 12/24/2011  . Nerve pain   . Obesity   . OSA (obstructive sleep apnea)   . Osteoarthritis   . Osteopenia 08/05/2014  . Panic attacks   . Pelvic floor dysfunction 09/02/2016  . Sleep apnea, obstructive   . Stress incontinence   . Varicose veins of lower limb 10/02/2011  . Vasovagal syncope 11/22/2017  . Vitamin D deficiency     PAST SURGICAL HISTORY: Past Surgical History:  Procedure Laterality Date  . ABDOMINAL SURGERY  02-01-12   ABDOMINAL SURGERY  . APPENDECTOMY  1962  . BARIATRIC SURGERY    . BLADDER SUSPENSION     complicated by bowel nick/ clolostomy  . BLEPHAROPLASTY Bilateral   . CARPAL TUNNEL RELEASE  02-01-12   right  . CATARACT EXTRACTION Bilateral   . CERVICAL DISC SURGERY     C-7 in 2004, C-4 and C-5 in 2010  . colostomy bag     Confirm with patient. Listed under medical conditions on form dated 07/18/09.  [name] COLOSTOMY REVERSAL  02-01-12  . EYE SURGERY     b/l cataracts and b/l blepharplasty  . [place]  . GASTRIC BYPASS  2003   Patient also noted "gastric bypass resection - 2004"  . HERNIA REPAIR  2009   Two hernias and abdominal reconstruction  . ivc filter     prophyllactically- no hx DVT  . LUMBAR DISC SURGERY     L1-L3 all had surgery most  recently in 2017  . LUMBAR DISC SURGERY     L3-L4, Dr. Trenton Gammon  . pannilectomy     Per medical history form dated 07/18/09.  [name] TOTAL KNEE ARTHROPLASTY  02/11/2012   Procedure: TOTAL KNEE ARTHROPLASTY;  Surgeon: Mauri Pole, MD;  Location: [facility];   Service: Orthopedics;  Laterality: Right;  . ULNAR NERVE REPAIR Left 07/18/2016   by Dr.Henry Pool  . VULVA SURGERY  02-01-12   cyst removal-pt 8 months pregnant    SOCIAL HISTORY: Social History   Tobacco Use  . Smoking status: Never Smoker  . Smokeless tobacco: Never Used  Substance Use Topics  . Alcohol use: No    Alcohol/week: 0.0 standard drinks    Comment: special occasions  . Drug use: No    FAMILY HISTORY: Family History  Problem Relation Age of Onset  . Alcohol abuse Mother   . Other Mother        accidental med overdose  . Emphysema Mother        smoked  . Bipolar disorder Mother   . Sudden death Mother   . Liver disease Mother   . Other Father        CHF  . Diabetes Father   . Cancer Father        throat ca/ smoked  . Alcohol abuse Father   . Stroke Father   . Hypertension Father   . Liver disease Father   . Hypertension Brother   . Hyperlipidemia Brother   . Obesity Brother   . Heart attack Maternal Grandfather   . Heart attack Paternal Grandmother   . Heart attack Paternal Grandfather   . Arthritis Son        psoriatic  . Arthritis Son        psoriatic  . Obesity Son    ROS: Review of Systems  Constitutional: Positive for weight loss.  Gastrointestinal: Negative for nausea and vomiting.  Musculoskeletal:       Negative for muscle weakness.  Psychiatric/Behavioral: Positive for depression (emotional eating).   PHYSICAL EXAM: Blood pressure 109/64, pulse 60, temperature 98.4 F (36.9 C), temperature source Oral, height 4\' 11"  (1.499 m), weight 174 lb (78.9 kg), SpO2 97 %. Body mass index is 35.14 kg/m. Physical Exam Vitals signs reviewed.  Constitutional:      Appearance: Normal appearance. She is obese.  Cardiovascular:     Rate and Rhythm: Normal rate.     Pulses: Normal pulses.  Pulmonary:     Effort: Pulmonary effort is normal.     Breath sounds: Normal breath sounds.  Musculoskeletal: Normal range of motion.  Skin:     General: Skin is warm and dry.  Neurological:     Mental Status: She is alert and oriented to person, place, and time.  Psychiatric:        Behavior: Behavior normal.   RECENT LABS AND TESTS: BMET    Component Value Date/Time   NA 144 01/29/2018 1001   K 3.9 01/29/2018 1001   CL 105 01/29/2018 1001   CO2 23 01/29/2018 1001   GLUCOSE 83 01/29/2018 1001   GLUCOSE 105 (H) 07/10/2017 1835   BUN 16 01/29/2018 1001   CREATININE 0.67 01/29/2018 1001   CREATININE 0.83 11/28/2012 1153   CALCIUM 9.0 01/29/2018 1001   GFRNONAA 89 01/29/2018 1001   GFRAA 103 01/29/2018 1001   Lab Results  Component Value Date   HGBA1C 5.7 (H) 01/29/2018   HGBA1C 5.9 07/03/2017   HGBA1C  5.9 09/06/2016   HGBA1C 5.8 03/14/2016   HGBA1C 5.6 08/22/2015   Lab Results  Component Value Date   INSULIN 4.3 01/29/2018   CBC    Component Value Date/Time   WBC 7.0 01/29/2018 1001   WBC 13.0 (H) 07/10/2017 1835   RBC 3.84 01/29/2018 1001   RBC 3.88 07/10/2017 1835   HGB 10.8 (L) 01/29/2018 1001   HGB 12.4 02/19/2011 1425   HCT 35.0 01/29/2018 1001   HCT 37.1 02/19/2011 1425   PLT 203 07/10/2017 1835   PLT 220 02/19/2011 1425   MCV 91 01/29/2018 1001   MCV 93.7 02/19/2011 1425   MCH 28.1 01/29/2018 1001   MCH 28.6 07/10/2017 1835   MCHC 30.9 (L) 01/29/2018 1001   MCHC 32.2 07/10/2017 1835   RDW 15.0 01/29/2018 1001   RDW 13.7 02/19/2011 1425   LYMPHSABS 1.8 01/29/2018 1001   LYMPHSABS 1.1 02/19/2011 1425   MONOABS 1.1 (H) 07/10/2017 1835   MONOABS 0.5 02/19/2011 1425   EOSABS 0.5 (H) 01/29/2018 1001   BASOSABS 0.1 01/29/2018 1001   BASOSABS 0.0 02/19/2011 1425   Iron/TIBC/Ferritin/ %Sat    Component Value Date/Time   IRON 66 02/04/2015 0738   TIBC 292 02/19/2011 1425   FERRITIN 15.5 08/05/2014 0945   IRONPCTSAT 17.3 (L) 02/04/2015 0738   IRONPCTSAT 17 (L) 02/19/2011 1425   Lipid Panel     Component Value Date/Time   CHOL 114 01/29/2018 1001   TRIG 78 01/29/2018 1001   HDL 58  01/29/2018 1001   CHOLHDL 2 07/03/2017 1550   VLDL 13.2 07/03/2017 1550   LDLCALC 40 01/29/2018 1001   Hepatic Function Panel     Component Value Date/Time   PROT 6.3 01/29/2018 1001   ALBUMIN 4.2 01/29/2018 1001   AST 29 01/29/2018 1001   ALT 22 01/29/2018 1001   ALKPHOS 103 01/29/2018 1001   BILITOT 0.4 01/29/2018 1001   BILIDIR 0.1 06/01/2013 0745   IBILI 0.6 02/02/2013 0850      Component Value Date/Time   TSH 1.900 01/29/2018 1001   TSH 2.99 09/06/2016 0751   TSH 3.21 03/14/2016 0757   Results for JENNIFER, PAYES (MRN [id number]) as of 04/02/2018 12:12  Ref. Range 01/29/2018 10:01  Vitamin D, 25-Hydroxy Latest Ref Range: 30.0 - 100.0 ng/mL 23.7 (L)   OBESITY BEHAVIORAL INTERVENTION VISIT  Today's visit was #5  Starting weight: 186 lbs Starting date: 01/28/2018 Today's weight: 174 lbs  Today's date: 04/02/2018 Total lbs lost to date: 12 At least 15 minutes were spent on discussing the following behavioral intervention visit.    04/02/2018  Height 4\' 11"  (1.499 m)  Weight 174 lb (78.9 kg)  BMI (Calculated) 35.13  BLOOD PRESSURE - SYSTOLIC 923  BLOOD PRESSURE - DIASTOLIC 64   Body Fat % 30.0 %  Total Body Water (lbs) 69.2 lbs   ASK: We discussed the diagnosis of obesity with [name] [name] today and [name] agreed to give [place] permission to discuss obesity behavioral modification therapy today.  ASSESS: [name] has the diagnosis of obesity and her BMI today is 35.13. [name] is in the action stage of change.   ADVISE: [name] was educated on the multiple health risks of obesity as well as the benefit of weight loss to improve her health. She was advised of the need for long term treatment and the importance of lifestyle modifications to improve her current health and to decrease her risk of future health problems.  AGREE: Multiple dietary modification options and  treatment options were discussed and  [name] agreed to follow the recommendations documented in the above  note.  ARRANGE: [name] was educated on the importance of frequent visits to treat obesity as outlined per CMS and USPSTF guidelines and agreed to schedule her next follow up appointment today.  [name], am acting as Location manager for [employer], FNP-C.  I have reviewed the above documentation for accuracy and completeness, and I agree with the above.  -  , FNP-C.

## 2018-04-07 ENCOUNTER — Ambulatory Visit: Payer: Self-pay | Admitting: *Deleted

## 2018-04-07 MED ORDER — OSELTAMIVIR PHOSPHATE 75 MG PO CAPS: 75.0000 mg | ORAL_CAPSULE | Freq: Two times a day (BID) | ORAL | 0 refills | Status: DC

## 2018-04-07 NOTE — Telephone Encounter (Signed)
Pt calling to request prescription for Tamiflu. Pt's daughter in law was diagnosed with the flu on Saturday and pt is currently living with daughter in law. Pt began to have symptoms on yesterday morning. Pt states she now has a cough, fever of 100.5, headache, scratchy throat and muscle aches.Pt has received the flu vaccine this year. Pt would like for prescription to be sent to [facility] in [place] on [address] [id number]. Pt can be contacted at [phone].   Reason for Disposition . [1] Influenza EXPOSURE (Close Contact) within last 48 hours (2 days) AND [2] exposed person is HIGH RISK (e.g., age > 60 years, pregnant, HIV+, chronic medical condition)  Answer Assessment - Initial Assessment Questions 1. TYPE of EXPOSURE: "How were you exposed?" (e.g., close contact, not a close contact)     Symptoms started on yesterday morning, exposed by daughter in law 2. DATE of EXPOSURE: "When did the exposure occur?" (e.g., hour, days, weeks)     Daughter in law diagnosed on Saturday 3. HIGH RISK for COMPLICATIONS: "Do you have any heart or lung problems? Do you have a weakened immune system?" (e.g., CHF, COPD, asthma, HIV positive, chemotherapy, renal failure, diabetes mellitus, sickle cell anemia)     No 4. SYMPTOMS: "Do you have any symptoms?" (e.g., cough, fever, sore throat, difficulty breathing).     Sore throat, cough-clear but not a lot, fever of 100.5  Protocols used: INFLUENZA EXPOSURE-A-AH

## 2018-04-07 NOTE — Telephone Encounter (Signed)
Please advise 

## 2018-04-07 NOTE — Telephone Encounter (Signed)
OK to do Tamiflu 75 mg bid for 5 d, in future I would prefer to evaluate her. TY.

## 2018-04-07 NOTE — Telephone Encounter (Signed)
Sent in and patient informed 

## 2018-04-15 ENCOUNTER — Ambulatory Visit: Payer: Medicare Other | Admitting: Internal Medicine

## 2018-04-16 ENCOUNTER — Encounter (INDEPENDENT_AMBULATORY_CARE_PROVIDER_SITE_OTHER): Payer: Self-pay

## 2018-04-23 ENCOUNTER — Other Ambulatory Visit: Payer: Self-pay

## 2018-04-23 ENCOUNTER — Ambulatory Visit (INDEPENDENT_AMBULATORY_CARE_PROVIDER_SITE_OTHER): Payer: Medicare Other | Admitting: Family Medicine

## 2018-04-23 ENCOUNTER — Encounter (INDEPENDENT_AMBULATORY_CARE_PROVIDER_SITE_OTHER): Payer: Self-pay | Admitting: Family Medicine

## 2018-04-23 DIAGNOSIS — Z6835 Body mass index (BMI) 35.0-35.9, adult: Secondary | ICD-10-CM

## 2018-04-23 DIAGNOSIS — F3289 Other specified depressive episodes: Secondary | ICD-10-CM

## 2018-04-23 DIAGNOSIS — R059 Cough, unspecified: Secondary | ICD-10-CM

## 2018-04-23 DIAGNOSIS — E559 Vitamin D deficiency, unspecified: Secondary | ICD-10-CM | POA: Diagnosis not present

## 2018-04-23 DIAGNOSIS — R05 Cough: Secondary | ICD-10-CM

## 2018-04-23 MED ORDER — BUPROPION HCL ER (SR) 200 MG PO TB12: 200.0000 mg | ORAL_TABLET | Freq: Every day | ORAL | 0 refills | Status: DC

## 2018-04-23 MED ORDER — VITAMIN D (ERGOCALCIFEROL) 1.25 MG (50000 UNIT) PO CAPS: 50000.0000 [IU] | ORAL_CAPSULE | ORAL | 0 refills | Status: DC

## 2018-04-23 NOTE — Progress Notes (Signed)
Office: [phone]  /  Fax: [phone] TeleHealth Visit:  [name] has verbally consented to this TeleHealth visit today. The patient is located at home, the provider is located at the [facility] office. The participants in this visit include the listed provider and patient. The visit was conducted today via FaceTime.  HPI:   Chief Complaint: OBESITY [name] is here to discuss her progress with her obesity treatment plan. She is keeping a food journal with 1200-1300 calories and 85+ grams of protein and is following her eating plan approximately 60% of the time. She states she is exercising 0 minutes 0 times per week. [name] is moving to [facility] on April 19.  She reports she is not getting all of her protein. She denies polyphagia. She states she weighed 168 lbs at home today. We were unable to weigh the patient today for this TeleHealth visit. She feels as if she has lost weight since her last visit. She has lost 12 lbs since starting treatment with [place].  Depression with emotional eating behaviors [name] reports increased stress eating. She states food choices are not great at times, though she is staying at 1300 calories a day. She is struggling with emotional eating and using food for comfort to the extent that it is negatively impacting her health. She often snacks when she is not hungry. [name] sometimes feels she is out of control and then feels guilty that she made poor food choices. She has been working on behavior modification techniques to help reduce her emotional eating and has been somewhat successful. She shows no sign of suicidal or homicidal ideations.  Depression screen [name] 2/9 01/29/2018 11/08/2017 08/23/2016 08/22/2015 08/05/2014  Decreased Interest 3 0 0 1 0  Down, Depressed, Hopeless 2 0 0 1 0  PHQ - 2 Score 5 0 0 2 0  Altered sleeping 3 - - 0 -  Tired, decreased energy 3 - - 2 -  Change in appetite 1 - - 0 -  Feeling bad or failure about yourself  1 - -  2 -  Trouble concentrating 1 - - 0 -  Moving slowly or fidgety/restless 3 - - 0 -  Suicidal thoughts 0 - - 0 -  PHQ-9 Score 17 - - 6 -  Difficult doing work/chores Somewhat difficult - - Somewhat difficult -  Some recent data might be hidden   Vitamin D deficiency [name] has a diagnosis of Vitamin D deficiency, which is not at goal. Her last Vitamin D level was reported to be 23.7 on 01/29/2018.  She is currently taking prescription Vit D and denies nausea, vomiting or muscle weakness.  Cough [name] reports having a cough that is left over from the flu she had 1-2 months ago. She denies fever, myalgias, or shortness of breath. She states she is taking OTC antihistamine and reports her cough is not worsening.  ASSESSMENT AND PLAN:  Vitamin D deficiency - Plan: Vitamin D, Ergocalciferol, (DRISDOL) 1.25 MG (50000 UT) CAPS capsule  Cough  Other depression - with emotional eating - Plan: buPROPion (WELLBUTRIN SR) 200 MG 12 hr tablet  Class 2 severe obesity with serious comorbidity and body mass index (BMI) of 35.0 to 35.9 in adult, unspecified obesity type ([place])  PLAN:  Depression with Emotional Eating Behaviors We discussed behavior modification techniques today to help [name] deal with her emotional eating and depression. Tomeeka was given a RX for bupropion 200 mg SR QAM #30 with 0 refills. She agrees  to follow-up with our clinic in 2-3 weeks.  Vitamin D Deficiency [name] was informed that low Vitamin D levels contributes to fatigue and are associated with obesity, breast, and colon cancer. She agrees to continue to take prescription Vit D @ 50,000 IU every three days #10 with 0 refills and will follow-up for routine testing of Vitamin D, at least 2-3 times per year. She was informed of the risk of over-replacement of Vitamin D and agrees to not increase her dose unless she discusses this with [place] first. [name] agrees to follow-up with our clinic in 2-3 weeks.  Cough [name] was advised to try OTC  Delsym for her cough and to call her PCP if symptoms worsen.  Obesity [name] is currently in the action stage of change. As such, her goal is to continue with weight loss efforts. She has agreed to keep a food journal with 1200-1300 calories and 85 grams of protein daily. We discussed the following Behavioral Modification Strategies today: increasing lean protein intake and planning for success.  [name] has agreed to follow-up with our clinic in 2-3 weeks. She was informed of the importance of frequent follow-up visits to maximize her success with intensive lifestyle modifications for her multiple health conditions.  ALLERGIES: Allergies  Allergen Reactions  . Hydrocodone Nausea And Vomiting     nausea and vomiting and headaches  . Myrbetriq [Mirabegron] Hives    Hives and itching  . Advil [Ibuprofen] Hives and Itching  . Amoxicillin Hives  . Cephalexin      Neuropathy  . Codeine     Crazy in the head, bp drops  . Levofloxacin Itching  . Lyrica [Pregabalin] Other (See Comments)    "Makes me like a zombie. I'm awake but I'm in slow motion."  . Meperidine Hcl     B/P drops  . Morphine And Related      States" extreme migraines"  . Oxycodone-Acetaminophen Other (See Comments)    Crazy in head, drop in bp  . Oxycodone-Acetaminophen Other (See Comments)  . Sulfonamide Derivatives Nausea And Vomiting    Stomach upset  . Valsartan-Hydrochlorothiazide Other (See Comments)    Headaches  . Adhesive [Tape] Rash    Latex in adhesive tape. Please use paper tape  . Erythromycin Diarrhea and Rash    Other reaction(s): GI Intolerance  . Penicillins Nausea And Vomiting and Rash    MEDICATIONS: Current Outpatient Medications on File Prior to Visit  Medication Sig Dispense Refill  . ALPRAZolam (XANAX) 0.25 MG tablet Take 1 tablet (0.25 mg total) by mouth daily as needed. For anxiety 30 tablet 3  . aspirin 81 MG tablet Take 81 mg by mouth daily.    . bisacodyl (DULCOLAX) 5 MG EC tablet Take  5 mg by mouth daily as needed for moderate constipation.    . conjugated estrogens (PREMARIN) vaginal cream Place 1 Applicatorful vaginally. 3 times a week    . DULoxetine (CYMBALTA) 60 MG capsule TAKE 1 CAPSULE BY MOUTH ONCE DAILY 90 capsule 1  . fluticasone (FLONASE) 50 MCG/ACT nasal spray 2 sprays each nostril once or twice daily as neededs 16 g 12  . hydrOXYzine (ATARAX/VISTARIL) 10 MG tablet Take 1-2 tablets (10-20 mg total) by mouth 3 (three) times daily as needed for itching. 30 tablet 1  . levocetirizine (XYZAL) 5 MG tablet Take 5 mg by mouth every evening.    . Magnesium 250 MG TABS Take 1 tablet by mouth daily.    . methylphenidate (RITALIN) 10 MG tablet  Take 1 tablet (10 mg total) by mouth as needed. 60 tablet 0  . Multiple Vitamins-Minerals (ICAPS AREDS 2 PO) Take 2 tablets by mouth daily.    . NON FORMULARY CPAP- set on 12 [employer].    [name] omeprazole (PRILOSEC) 20 MG capsule Take 20 mg by mouth daily.    . OPTIMAL-D 52841 units capsule TAKE 2 CAPSULES BY MOUTH ONCE A WEEK 8 capsule 4  . oseltamivir (TAMIFLU) 75 MG capsule Take 1 capsule (75 mg total) by mouth 2 (two) times daily. Take for 5 days. 10 capsule 0  . rosuvastatin (CRESTOR) 20 MG tablet Take 1 tablet (20 mg total) by mouth daily. 90 tablet 1  . traMADol (ULTRAM) 50 MG tablet Take 50 mg by mouth every 6 (six) hours as needed.    . traZODone (DESYREL) 100 MG tablet TAKE 1/2 TO 1 (ONE-HALF TO ONE) TABLET BY MOUTH ONCE DAILY AS NEEDED FOR SLEEP 90 tablet 0  . valsartan (DIOVAN) 40 MG tablet Take 40 mg by mouth daily.     No current facility-administered medications on file prior to visit.     PAST MEDICAL HISTORY: Past Medical History:  Diagnosis Date  . Allergy    rhinitis  . Anemia 02-01-12   iron absorption problem  . Arthritis 02-01-12   osteoarthritis, Back pain, spine surgeries ? retain hardware cervial and  lumbar.  . Asthma   . Back pain   . Cancer ([place]) 02-01-12   squamous cell -face  . Depression with anxiety  01/08/2011  . Diabetes ([place])   . Dyspnea   . Esophageal reflux   . Fibromyalgia   . Gallbladder problem   . Generalized headaches   . Heart murmur   . HLD (hyperlipidemia)   . HTN (hypertension)   . Hyperparathyroidism ([place]) 08/18/2014  . Joint pain   . Knee pain, right 09/20/2011  . Motion sickness 12/24/2011  . Nausea & vomiting 12/24/2011  . Nerve pain   . Obesity   . OSA (obstructive sleep apnea)   . Osteoarthritis   . Osteopenia 08/05/2014  . Panic attacks   . Pelvic floor dysfunction 09/02/2016  . Sleep apnea, obstructive   . Stress incontinence   . Varicose veins of lower limb 10/02/2011  . Vasovagal syncope 11/22/2017  . Vitamin D deficiency     PAST SURGICAL HISTORY: Past Surgical History:  Procedure Laterality Date  . ABDOMINAL SURGERY  02-01-12   ABDOMINAL SURGERY  . APPENDECTOMY  1962  . BARIATRIC SURGERY    . BLADDER SUSPENSION     complicated by bowel nick/ clolostomy  . BLEPHAROPLASTY Bilateral   . CARPAL TUNNEL RELEASE  02-01-12   right  . CATARACT EXTRACTION Bilateral   . CERVICAL DISC SURGERY     C-7 in 2004, C-4 and C-5 in 2010  . colostomy bag     Confirm with patient. Listed under medical conditions on form dated 07/18/09.  [name] COLOSTOMY REVERSAL  02-01-12  . EYE SURGERY     b/l cataracts and b/l blepharplasty  . [place]  . GASTRIC BYPASS  2003   Patient also noted "gastric bypass resection - 2004"  . HERNIA REPAIR  2009   Two hernias and abdominal reconstruction  . ivc filter     prophyllactically- no hx DVT  . LUMBAR DISC SURGERY     L1-L3 all had surgery most recently in 2017  . LUMBAR DISC SURGERY     L3-L4, Dr. Trenton Gammon  . pannilectomy  Per medical history form dated 07/18/09.  [name] TOTAL KNEE ARTHROPLASTY  02/11/2012   Procedure: TOTAL KNEE ARTHROPLASTY;  Surgeon: Mauri Pole, MD;  Location: [facility];  Service: Orthopedics;  Laterality: Right;  . ULNAR NERVE REPAIR Left 07/18/2016   by Dr.Henry Pool  . VULVA SURGERY  02-01-12    cyst removal-pt 8 months pregnant    SOCIAL HISTORY: Social History   Tobacco Use  . Smoking status: Never Smoker  . Smokeless tobacco: Never Used  Substance Use Topics  . Alcohol use: No    Alcohol/week: 0.0 standard drinks    Comment: special occasions  . Drug use: No    FAMILY HISTORY: Family History  Problem Relation Age of Onset  . Alcohol abuse Mother   . Other Mother        accidental med overdose  . Emphysema Mother        smoked  . Bipolar disorder Mother   . Sudden death Mother   . Liver disease Mother   . Other Father        CHF  . Diabetes Father   . Cancer Father        throat ca/ smoked  . Alcohol abuse Father   . Stroke Father   . Hypertension Father   . Liver disease Father   . Hypertension Brother   . Hyperlipidemia Brother   . Obesity Brother   . Heart attack Maternal Grandfather   . Heart attack Paternal Grandmother   . Heart attack Paternal Grandfather   . Arthritis Son        psoriatic  . Arthritis Son        psoriatic  . Obesity Son    ROS: Review of Systems  Constitutional: Negative for fever.  Respiratory: Positive for cough. Negative for shortness of breath.   Gastrointestinal: Negative for nausea and vomiting.  Musculoskeletal: Negative for myalgias.       Negative for muscle weakness.  Psychiatric/Behavioral: Positive for depression (emotional eating).   PHYSICAL EXAM: Pt in no acute distress  RECENT LABS AND TESTS: BMET    Component Value Date/Time   NA 144 01/29/2018 1001   K 3.9 01/29/2018 1001   CL 105 01/29/2018 1001   CO2 23 01/29/2018 1001   GLUCOSE 83 01/29/2018 1001   GLUCOSE 105 (H) 07/10/2017 1835   BUN 16 01/29/2018 1001   CREATININE 0.67 01/29/2018 1001   CREATININE 0.83 11/28/2012 1153   CALCIUM 9.0 01/29/2018 1001   GFRNONAA 89 01/29/2018 1001   GFRAA 103 01/29/2018 1001   Lab Results  Component Value Date   HGBA1C 5.7 (H) 01/29/2018   HGBA1C 5.9 07/03/2017   HGBA1C 5.9 09/06/2016   HGBA1C  5.8 03/14/2016   HGBA1C 5.6 08/22/2015   Lab Results  Component Value Date   INSULIN 4.3 01/29/2018   CBC    Component Value Date/Time   WBC 7.0 01/29/2018 1001   WBC 13.0 (H) 07/10/2017 1835   RBC 3.84 01/29/2018 1001   RBC 3.88 07/10/2017 1835   HGB 10.8 (L) 01/29/2018 1001   HGB 12.4 02/19/2011 1425   HCT 35.0 01/29/2018 1001   HCT 37.1 02/19/2011 1425   PLT 203 07/10/2017 1835   PLT 220 02/19/2011 1425   MCV 91 01/29/2018 1001   MCV 93.7 02/19/2011 1425   MCH 28.1 01/29/2018 1001   MCH 28.6 07/10/2017 1835   MCHC 30.9 (L) 01/29/2018 1001   MCHC 32.2 07/10/2017 1835   RDW 15.0 01/29/2018 1001  RDW 13.7 02/19/2011 1425   LYMPHSABS 1.8 01/29/2018 1001   LYMPHSABS 1.1 02/19/2011 1425   MONOABS 1.1 (H) 07/10/2017 1835   MONOABS 0.5 02/19/2011 1425   EOSABS 0.5 (H) 01/29/2018 1001   BASOSABS 0.1 01/29/2018 1001   BASOSABS 0.0 02/19/2011 1425   Iron/TIBC/Ferritin/ %Sat    Component Value Date/Time   IRON 66 02/04/2015 0738   TIBC 292 02/19/2011 1425   FERRITIN 15.5 08/05/2014 0945   IRONPCTSAT 17.3 (L) 02/04/2015 0738   IRONPCTSAT 17 (L) 02/19/2011 1425   Lipid Panel     Component Value Date/Time   CHOL 114 01/29/2018 1001   TRIG 78 01/29/2018 1001   HDL 58 01/29/2018 1001   CHOLHDL 2 07/03/2017 1550   VLDL 13.2 07/03/2017 1550   LDLCALC 40 01/29/2018 1001   Hepatic Function Panel     Component Value Date/Time   PROT 6.3 01/29/2018 1001   ALBUMIN 4.2 01/29/2018 1001   AST 29 01/29/2018 1001   ALT 22 01/29/2018 1001   ALKPHOS 103 01/29/2018 1001   BILITOT 0.4 01/29/2018 1001   BILIDIR 0.1 06/01/2013 0745   IBILI 0.6 02/02/2013 0850      Component Value Date/Time   TSH 1.900 01/29/2018 1001   TSH 2.99 09/06/2016 0751   TSH 3.21 03/14/2016 0757    Results for ANIYHA, TATE (MRN [id number]) as of 04/23/2018 16:06  Ref. Range 01/29/2018 10:01  Vitamin D, 25-Hydroxy Latest Ref Range: 30.0 - 100.0 ng/mL 23.7 (L)    I, Michaelene Song, am acting as  Location manager for [employer], FNP-C.  I have reviewed the above documentation for accuracy and completeness, and I agree with the above.  - Alcus Bradly, FNP-C.

## 2018-04-24 ENCOUNTER — Encounter (INDEPENDENT_AMBULATORY_CARE_PROVIDER_SITE_OTHER): Payer: Self-pay | Admitting: Family Medicine

## 2018-04-28 ENCOUNTER — Other Ambulatory Visit: Payer: Self-pay | Admitting: Family Medicine

## 2018-05-14 ENCOUNTER — Ambulatory Visit (INDEPENDENT_AMBULATORY_CARE_PROVIDER_SITE_OTHER): Payer: Medicare Other | Admitting: Family Medicine

## 2018-05-14 ENCOUNTER — Encounter (INDEPENDENT_AMBULATORY_CARE_PROVIDER_SITE_OTHER): Payer: Self-pay | Admitting: Family Medicine

## 2018-05-14 ENCOUNTER — Other Ambulatory Visit: Payer: Self-pay

## 2018-05-14 DIAGNOSIS — E66812 Obesity, class 2: Secondary | ICD-10-CM

## 2018-05-14 DIAGNOSIS — F3289 Other specified depressive episodes: Secondary | ICD-10-CM | POA: Diagnosis not present

## 2018-05-14 DIAGNOSIS — E559 Vitamin D deficiency, unspecified: Secondary | ICD-10-CM

## 2018-05-14 DIAGNOSIS — Z6835 Body mass index (BMI) 35.0-35.9, adult: Secondary | ICD-10-CM

## 2018-05-14 MED ORDER — VITAMIN D (ERGOCALCIFEROL) 1.25 MG (50000 UNIT) PO CAPS: 50000.0000 [IU] | ORAL_CAPSULE | ORAL | 0 refills | Status: DC

## 2018-05-14 MED ORDER — BUPROPION HCL ER (SR) 200 MG PO TB12: 200.0000 mg | ORAL_TABLET | Freq: Every day | ORAL | 0 refills | Status: DC

## 2018-05-14 NOTE — Progress Notes (Signed)
Office: [phone]  /  Fax: [phone] TeleHealth Visit:  [name] has verbally consented to this TeleHealth visit today. The patient is located at home, the provider is located at the [employer] [facility] office. The participants in this visit include the listed provider, patient, and her spouse [name]. The visit was conducted today via Webex.  HPI:   Chief Complaint: OBESITY [name] is here to discuss her progress with her obesity treatment plan. She is keeping a food journal with 1200-1300 calories and 85+ grams of protein and is following her eating plan approximately 70 % of the time. She states she is moving households and walking lots of stairs. [name] has moved to [facility]  this week. She has lost 2  pounds. She is not always getting in her protein. [name] has not had time to cook recently. Her goal weight is around 140 to 145 pounds. [name] denies polyphagia.  We were unable to weigh the patient today for this TeleHealth visit. She feels as if she has lost weight since her last visit. She has lost 12 lbs since starting treatment with [place].  Depression with emotional eating behaviors [name] started bupropion at an increased dose about 1 week ago. The bupropion was increased to 200 mg from 150 mg at the last office visit. She notes increased cravings for candy and sweets. She is struggling with emotional eating and using food for comfort to the extent that it is negatively impacting her health. She often snacks when she is not hungry. [name] sometimes feels she is out of control and then feels guilty that she made poor food choices. She has been working on behavior modification techniques to help reduce her emotional eating and has been somewhat successful.  Vitamin D Deficiency [name] has a diagnosis of vitamin D deficiency. She is currently on vit D, but is not at goal. Her last vitamin D level was 23.7 on 01/29/18. [name] denies nausea, vomiting, or muscle weakness.  ASSESSMENT AND  PLAN:  Vitamin D deficiency - Plan: Vitamin D, Ergocalciferol, (DRISDOL) 1.25 MG (50000 UT) CAPS capsule  Other depression - with emotional eating - Plan: buPROPion (WELLBUTRIN SR) 200 MG 12 hr tablet  Class 2 severe obesity with serious comorbidity and body mass index (BMI) of 35.0 to 35.9 in adult, unspecified obesity type ([place])  PLAN:  Vitamin D Deficiency Teandra was informed that low vitamin D levels contribute to fatigue and are associated with obesity, breast, and colon cancer. [name] agrees to continue to take prescription Vit D @50 ,000 IU every 3 days #10 with no refills and will follow up for routine testing of vitamin D, at least 2-3 times per year. She was informed of the risk of over-replacement of vitamin D and agrees to not increase her dose unless she discusses this with [place] first. [name] agrees to follow up in 2 weeks as directed.  Depression with Emotional Eating Behaviors We discussed behavior modification techniques today to help [name] deal with her emotional eating and depression. She has agreed to take Wellbutrin SR 200 mg qAM #30 with no refills and agreed to follow up as directed.  Obesity [name] is currently in the action stage of change. As such, her goal is to continue with weight loss efforts. She has agreed to follow the Category 2 plan or keep a food journal with 1200 calories and 85 grams of protein. [name] has been instructed to walk a few days per week after unpacking. We discussed the following Behavioral  Modification Strategies today: increasing lean protein intake, decreasing simple carbohydrates, and work on meal planning and easy cooking plans.  [name] has agreed to follow up with our clinic in 2 weeks. She was informed of the importance of frequent follow up visits to maximize her success with intensive lifestyle modifications for her multiple health conditions.  ALLERGIES: Allergies  Allergen Reactions  . Hydrocodone Nausea And Vomiting     nausea and vomiting  and headaches  . Myrbetriq [Mirabegron] Hives    Hives and itching  . Advil [Ibuprofen] Hives and Itching  . Amoxicillin Hives  . Cephalexin      Neuropathy  . Codeine     Crazy in the head, bp drops  . Levofloxacin Itching  . Lyrica [Pregabalin] Other (See Comments)    "Makes me like a zombie. I'm awake but I'm in slow motion."  . Meperidine Hcl     B/P drops  . Morphine And Related      States" extreme migraines"  . Oxycodone-Acetaminophen Other (See Comments)    Crazy in head, drop in bp  . Oxycodone-Acetaminophen Other (See Comments)  . Sulfonamide Derivatives Nausea And Vomiting    Stomach upset  . Valsartan-Hydrochlorothiazide Other (See Comments)    Headaches  . Adhesive [Tape] Rash    Latex in adhesive tape. Please use paper tape  . Erythromycin Diarrhea and Rash    Other reaction(s): GI Intolerance  . Penicillins Nausea And Vomiting and Rash    MEDICATIONS: Current Outpatient Medications on File Prior to Visit  Medication Sig Dispense Refill  . ALPRAZolam (XANAX) 0.25 MG tablet Take 1 tablet (0.25 mg total) by mouth daily as needed. For anxiety 30 tablet 3  . aspirin 81 MG tablet Take 81 mg by mouth daily.    . bisacodyl (DULCOLAX) 5 MG EC tablet Take 5 mg by mouth daily as needed for moderate constipation.    [name] buPROPion (WELLBUTRIN SR) 200 MG 12 hr tablet Take 1 tablet (200 mg total) by mouth daily. 30 tablet 0  . conjugated estrogens (PREMARIN) vaginal cream Place 1 Applicatorful vaginally. 3 times a week    . DULoxetine (CYMBALTA) 60 MG capsule Take 1 capsule by mouth once daily 90 capsule 0  . fluticasone (FLONASE) 50 MCG/ACT nasal spray 2 sprays each nostril once or twice daily as neededs 16 g 12  . hydrOXYzine (ATARAX/VISTARIL) 10 MG tablet Take 1-2 tablets (10-20 mg total) by mouth 3 (three) times daily as needed for itching. 30 tablet 1  . levocetirizine (XYZAL) 5 MG tablet Take 5 mg by mouth every evening.    . Magnesium 250 MG TABS Take 1 tablet by mouth  daily.    . methylphenidate (RITALIN) 10 MG tablet Take 1 tablet (10 mg total) by mouth as needed. 60 tablet 0  . Multiple Vitamins-Minerals (ICAPS AREDS 2 PO) Take 2 tablets by mouth daily.    . NON FORMULARY CPAP- set on [employer].    [name] omeprazole (PRILOSEC) 20 MG capsule Take 20 mg by mouth daily.    . OPTIMAL-D 24235 units capsule TAKE 2 CAPSULES BY MOUTH ONCE A WEEK 8 capsule 4  . oseltamivir (TAMIFLU) 75 MG capsule Take 1 capsule (75 mg total) by mouth 2 (two) times daily. Take for 5 days. 10 capsule 0  . rosuvastatin (CRESTOR) 20 MG tablet Take 1 tablet (20 mg total) by mouth daily. 90 tablet 1  . traMADol (ULTRAM) 50 MG tablet Take 50 mg by mouth every 6 (six) hours  as needed.    . traZODone (DESYREL) 100 MG tablet TAKE 1/2 TO 1 (ONE-HALF TO ONE) TABLET BY MOUTH ONCE DAILY AS NEEDED FOR SLEEP 90 tablet 0  . valsartan (DIOVAN) 40 MG tablet Take 40 mg by mouth daily.    . Vitamin D, Ergocalciferol, (DRISDOL) 1.25 MG (50000 UT) CAPS capsule Take 1 capsule (50,000 Units total) by mouth every 3 (three) days. 10 capsule 0   No current facility-administered medications on file prior to visit.     PAST MEDICAL HISTORY: Past Medical History:  Diagnosis Date  . Allergy    rhinitis  . Anemia 02-01-12   iron absorption problem  . Arthritis 02-01-12   osteoarthritis, Back pain, spine surgeries ? retain hardware cervial and  lumbar.  . Asthma   . Back pain   . Cancer ([place]) 02-01-12   squamous cell -face  . Depression with anxiety 01/08/2011  . Diabetes ([place])   . Dyspnea   . Esophageal reflux   . Fibromyalgia   . Gallbladder problem   . Generalized headaches   . Heart murmur   . HLD (hyperlipidemia)   . HTN (hypertension)   . Hyperparathyroidism ([place]) 08/18/2014  . Joint pain   . Knee pain, right 09/20/2011  . Motion sickness 12/24/2011  . Nausea & vomiting 12/24/2011  . Nerve pain   . Obesity   . OSA (obstructive sleep apnea)   . Osteoarthritis   . Osteopenia 08/05/2014  . Panic  attacks   . Pelvic floor dysfunction 09/02/2016  . Sleep apnea, obstructive   . Stress incontinence   . Varicose veins of lower limb 10/02/2011  . Vasovagal syncope 11/22/2017  . Vitamin D deficiency     PAST SURGICAL HISTORY: Past Surgical History:  Procedure Laterality Date  . ABDOMINAL SURGERY  02-01-12   ABDOMINAL SURGERY  . APPENDECTOMY  1962  . BARIATRIC SURGERY    . BLADDER SUSPENSION     complicated by bowel nick/ clolostomy  . BLEPHAROPLASTY Bilateral   . CARPAL TUNNEL RELEASE  02-01-12   right  . CATARACT EXTRACTION Bilateral   . CERVICAL DISC SURGERY     C-7 in 2004, C-4 and C-5 in 2010  . colostomy bag     Confirm with patient. Listed under medical conditions on form dated 07/18/09.  [name] COLOSTOMY REVERSAL  02-01-12  . EYE SURGERY     b/l cataracts and b/l blepharplasty  . [place]  . GASTRIC BYPASS  2003   Patient also noted "gastric bypass resection - 2004"  . HERNIA REPAIR  2009   Two hernias and abdominal reconstruction  . ivc filter     prophyllactically- no hx DVT  . LUMBAR DISC SURGERY     L1-L3 all had surgery most recently in 2017  . LUMBAR DISC SURGERY     L3-L4, Dr. Trenton Gammon  . pannilectomy     Per medical history form dated 07/18/09.  [name] TOTAL KNEE ARTHROPLASTY  02/11/2012   Procedure: TOTAL KNEE ARTHROPLASTY;  Surgeon: Mauri Pole, MD;  Location: [facility];  Service: Orthopedics;  Laterality: Right;  . ULNAR NERVE REPAIR Left 07/18/2016   by Dr.Henry Pool  . VULVA SURGERY  02-01-12   cyst removal-pt 8 months pregnant    SOCIAL HISTORY: Social History   Tobacco Use  . Smoking status: Never Smoker  . Smokeless tobacco: Never Used  Substance Use Topics  . Alcohol use: No    Alcohol/week: 0.0 standard drinks    Comment:  special occasions  . Drug use: No    FAMILY HISTORY: Family History  Problem Relation Age of Onset  . Alcohol abuse Mother   . Other Mother        accidental med overdose  . Emphysema Mother        smoked  .  Bipolar disorder Mother   . Sudden death Mother   . Liver disease Mother   . Other Father        CHF  . Diabetes Father   . Cancer Father        throat ca/ smoked  . Alcohol abuse Father   . Stroke Father   . Hypertension Father   . Liver disease Father   . Hypertension Brother   . Hyperlipidemia Brother   . Obesity Brother   . Heart attack Maternal Grandfather   . Heart attack Paternal Grandmother   . Heart attack Paternal Grandfather   . Arthritis Son        psoriatic  . Arthritis Son        psoriatic  . Obesity Son     ROS: Review of Systems  Gastrointestinal: Negative for nausea and vomiting.  Musculoskeletal:       Negative for muscle weakness.  Endo/Heme/Allergies:       Negative for polyphagia.  Psychiatric/Behavioral: Positive for depression.    PHYSICAL EXAM: Pt in no acute distress  RECENT LABS AND TESTS: BMET    Component Value Date/Time   NA 144 01/29/2018 1001   K 3.9 01/29/2018 1001   CL 105 01/29/2018 1001   CO2 23 01/29/2018 1001   GLUCOSE 83 01/29/2018 1001   GLUCOSE 105 (H) 07/10/2017 1835   BUN 16 01/29/2018 1001   CREATININE 0.67 01/29/2018 1001   CREATININE 0.83 11/28/2012 1153   CALCIUM 9.0 01/29/2018 1001   GFRNONAA 89 01/29/2018 1001   GFRAA 103 01/29/2018 1001   Lab Results  Component Value Date   HGBA1C 5.7 (H) 01/29/2018   HGBA1C 5.9 07/03/2017   HGBA1C 5.9 09/06/2016   HGBA1C 5.8 03/14/2016   HGBA1C 5.6 08/22/2015   Lab Results  Component Value Date   INSULIN 4.3 01/29/2018   CBC    Component Value Date/Time   WBC 7.0 01/29/2018 1001   WBC 13.0 (H) 07/10/2017 1835   RBC 3.84 01/29/2018 1001   RBC 3.88 07/10/2017 1835   HGB 10.8 (L) 01/29/2018 1001   HGB 12.4 02/19/2011 1425   HCT 35.0 01/29/2018 1001   HCT 37.1 02/19/2011 1425   PLT 203 07/10/2017 1835   PLT 220 02/19/2011 1425   MCV 91 01/29/2018 1001   MCV 93.7 02/19/2011 1425   MCH 28.1 01/29/2018 1001   MCH 28.6 07/10/2017 1835   MCHC 30.9 (L)  01/29/2018 1001   MCHC 32.2 07/10/2017 1835   RDW 15.0 01/29/2018 1001   RDW 13.7 02/19/2011 1425   LYMPHSABS 1.8 01/29/2018 1001   LYMPHSABS 1.1 02/19/2011 1425   MONOABS 1.1 (H) 07/10/2017 1835   MONOABS 0.5 02/19/2011 1425   EOSABS 0.5 (H) 01/29/2018 1001   BASOSABS 0.1 01/29/2018 1001   BASOSABS 0.0 02/19/2011 1425   Iron/TIBC/Ferritin/ %Sat    Component Value Date/Time   IRON 66 02/04/2015 0738   TIBC 292 02/19/2011 1425   FERRITIN 15.5 08/05/2014 0945   IRONPCTSAT 17.3 (L) 02/04/2015 0738   IRONPCTSAT 17 (L) 02/19/2011 1425   Lipid Panel     Component Value Date/Time   CHOL 114 01/29/2018 1001   TRIG  78 01/29/2018 1001   HDL 58 01/29/2018 1001   CHOLHDL 2 07/03/2017 1550   VLDL 13.2 07/03/2017 1550   LDLCALC 40 01/29/2018 1001   Hepatic Function Panel     Component Value Date/Time   PROT 6.3 01/29/2018 1001   ALBUMIN 4.2 01/29/2018 1001   AST 29 01/29/2018 1001   ALT 22 01/29/2018 1001   ALKPHOS 103 01/29/2018 1001   BILITOT 0.4 01/29/2018 1001   BILIDIR 0.1 06/01/2013 0745   IBILI 0.6 02/02/2013 0850      Component Value Date/Time   TSH 1.900 01/29/2018 1001   TSH 2.99 09/06/2016 0751   TSH 3.21 03/14/2016 0757    Results for VONDELL, SOWELL (MRN [id number]) as of 05/14/2018 12:27  Ref. Range 01/29/2018 10:01  Vitamin D, 25-Hydroxy Latest Ref Range: 30.0 - 100.0 ng/mL 23.7 (L)    I, Marcille Blanco, CMA, am acting as Location manager for [employer], FNP-C.  I have reviewed the above documentation for accuracy and completeness, and I agree with the above.  - Bryann Mcnealy, FNP-C.

## 2018-05-22 ENCOUNTER — Other Ambulatory Visit: Payer: Self-pay | Admitting: Cardiology

## 2018-05-22 DIAGNOSIS — I6522 Occlusion and stenosis of left carotid artery: Secondary | ICD-10-CM

## 2018-05-25 ENCOUNTER — Encounter: Payer: Self-pay | Admitting: Family Medicine

## 2018-05-25 ENCOUNTER — Other Ambulatory Visit: Payer: Self-pay | Admitting: Family Medicine

## 2018-05-26 ENCOUNTER — Encounter: Payer: Self-pay | Admitting: Family Medicine

## 2018-05-27 ENCOUNTER — Other Ambulatory Visit: Payer: Self-pay

## 2018-05-27 ENCOUNTER — Encounter: Payer: Self-pay | Admitting: Family Medicine

## 2018-05-27 ENCOUNTER — Ambulatory Visit (INDEPENDENT_AMBULATORY_CARE_PROVIDER_SITE_OTHER): Payer: Medicare Other | Admitting: Family Medicine

## 2018-05-27 ENCOUNTER — Telehealth: Payer: Self-pay | Admitting: Family Medicine

## 2018-05-27 DIAGNOSIS — J3089 Other allergic rhinitis: Secondary | ICD-10-CM

## 2018-05-27 DIAGNOSIS — J302 Other seasonal allergic rhinitis: Secondary | ICD-10-CM

## 2018-05-27 DIAGNOSIS — F418 Other specified anxiety disorders: Secondary | ICD-10-CM | POA: Diagnosis not present

## 2018-05-27 DIAGNOSIS — E782 Mixed hyperlipidemia: Secondary | ICD-10-CM | POA: Diagnosis not present

## 2018-05-27 MED ORDER — ALPRAZOLAM 0.25 MG PO TABS: 0.2500 mg | ORAL_TABLET | Freq: Every day | ORAL | 3 refills | Status: AC | PRN

## 2018-05-27 MED ORDER — TRAZODONE HCL 100 MG PO TABS: ORAL_TABLET | ORAL | 2 refills | Status: DC

## 2018-05-27 NOTE — Progress Notes (Signed)
Chief Complaint  Patient presents with  . Medication Refill    Subjective [name] presents for f/u anxiety/depression. Due to COVID-19 pandemic, we are interacting via web portal for an electronic face-to-face visit. I verified patient's ID using 2 identifiers. Patient agreed to proceed with visit via this method. Patient is at home, I am at home.  Patient and I are present for visit.   She is currently being treated with Wellbutrin, Xanax prn, Cymbalta, trazodone prn.  Very well controlled. No Ae's.  No thoughts of harming self or others. No self-medication with alcohol, prescription drugs or illicit drugs.  Hyperlipidemia Patient presents for dyslipidemia follow up. Currently being treated with Crestor 20 mg/d and compliance with treatment thus far has been good. She denies myalgias. She is adhering to a healthy. Exercise: walking  Allergies well controlled, better in [place], [place] than in [place]. Taking Xyzal 5 mg/d and Flonase.   ROS Psych: No homicidal or suicidal thoughts MSK: No muscle aches  Past Medical History:  Diagnosis Date  . Allergy    rhinitis  . Anemia 02-01-12   iron absorption problem  . Arthritis 02-01-12   osteoarthritis, Back pain, spine surgeries ? retain hardware cervial and  lumbar.  . Asthma   . Back pain   . Cancer ([place]) 02-01-12   squamous cell -face  . Depression with anxiety 01/08/2011  . Diabetes ([place])   . Dyspnea   . Esophageal reflux   . Fibromyalgia   . Gallbladder problem   . Generalized headaches   . Heart murmur   . HLD (hyperlipidemia)   . HTN (hypertension)   . Hyperparathyroidism ([place]) 08/18/2014  . Joint pain   . Knee pain, right 09/20/2011  . Motion sickness 12/24/2011  . Nausea & vomiting 12/24/2011  . Nerve pain   . Obesity   . OSA (obstructive sleep apnea)   . Osteoarthritis   . Osteopenia 08/05/2014  . Panic attacks   . Pelvic floor dysfunction 09/02/2016  . Sleep apnea, obstructive   . Stress incontinence    . Varicose veins of lower limb 10/02/2011  . Vasovagal syncope 11/22/2017  . Vitamin D deficiency     Exam No conversational dyspnea Age appropriate judgment and insight Nml affect and mood  Assessment and Plan  Depression with anxiety  Hyperlipidemia, mixed  Seasonal and perennial allergic rhinitis  Cont Current meds.  Counseled on diet and exercise. F/u in 6 mo or prn. The patient voiced understanding and agreement to the plan.  Hecker, DO 05/27/18 9:46 AM

## 2018-05-27 NOTE — Telephone Encounter (Signed)
Spoke with pt, stated she would call back and schedule follow up

## 2018-05-28 ENCOUNTER — Other Ambulatory Visit: Payer: Self-pay

## 2018-05-28 ENCOUNTER — Other Ambulatory Visit: Payer: Self-pay | Admitting: Family Medicine

## 2018-05-28 ENCOUNTER — Ambulatory Visit (INDEPENDENT_AMBULATORY_CARE_PROVIDER_SITE_OTHER): Payer: Medicare Other | Admitting: Family Medicine

## 2018-05-28 ENCOUNTER — Encounter (INDEPENDENT_AMBULATORY_CARE_PROVIDER_SITE_OTHER): Payer: Self-pay | Admitting: Family Medicine

## 2018-05-28 DIAGNOSIS — F3289 Other specified depressive episodes: Secondary | ICD-10-CM | POA: Diagnosis not present

## 2018-05-28 DIAGNOSIS — Z1231 Encounter for screening mammogram for malignant neoplasm of breast: Secondary | ICD-10-CM

## 2018-05-28 DIAGNOSIS — Z6835 Body mass index (BMI) 35.0-35.9, adult: Secondary | ICD-10-CM | POA: Diagnosis not present

## 2018-05-28 NOTE — Progress Notes (Signed)
Office: [phone]  /  Fax: [phone] TeleHealth Visit:  [name] has verbally consented to this TeleHealth visit today. The patient is located at home, the provider is located at the [employer] [facility] office. The participants in this visit include the listed provider and patient. The visit was conducted today via FaceTime. Total time of CMA and provider calls to patient was 25 minutes in length.  HPI:   Chief Complaint: OBESITY [name] is here to discuss her progress with her obesity treatment plan. She is on the Category 2 plan or journaling 1200 calories + 85 grams of protein and is following hereating plan approximately 50% of the time. She states she is walking 30-60 minutes 6 times per week. [name] has been very anxious recently due to her husband's behavior - staying up late which interrupts her sleep and not sticking to the eating plan. Her husband is on our plan also. She states she is not eating enough protein and has started drinking sodas again (about 5 per week). She does not like diet soda. She reports struggling to get protein in due to restriction from duodenal switch. She also does not focus on eating protein first in a meal. She states her weight is 165 lbs. We were unable to weigh the patient today for this TeleHealth visit. She feels as if she has gained 1 lb since her last visit. She has lost 12 lbs since starting treatment with [place].  Depression with emotional eating behaviors [name] is struggling with emotional eating and using food for comfort to the extent that it is negatively impacting her health. She often snacks when she is not hungry. [name] sometimes feels she is out of control and then feels guilty that she made poor food choices. She has been drinking 5 sodas a week. She feels the increased dose of bupropion has decreased the frequency of her cravings. She does report increased anxiety currently. She has been working on behavior modification techniques to  help reduce her emotional eating and has been somewhat successful. She shows no sign of suicidal or homicidal ideations.  Depression screen [facility] 2/9 01/29/2018 11/08/2017 08/23/2016 08/22/2015 08/05/2014  Decreased Interest 3 0 0 1 0  Down, Depressed, Hopeless 2 0 0 1 0  PHQ - 2 Score 5 0 0 2 0  Altered sleeping 3 - - 0 -  Tired, decreased energy 3 - - 2 -  Change in appetite 1 - - 0 -  Feeling bad or failure about yourself  1 - - 2 -  Trouble concentrating 1 - - 0 -  Moving slowly or fidgety/restless 3 - - 0 -  Suicidal thoughts 0 - - 0 -  PHQ-9 Score 17 - - 6 -  Difficult doing work/chores Somewhat difficult - - Somewhat difficult -  Some recent data might be hidden   ASSESSMENT AND PLAN:  Other depression - with emotional eating  Class 2 severe obesity with serious comorbidity and body mass index (BMI) of 35.0 to 35.9 in adult, unspecified obesity type (HCC)  PLAN:  Depression with Emotional Eating Behaviors We discussed behavior modification techniques today to help [name] deal with her emotional eating and depression. She will increase her protein and will continue on bupropion.  Obesity [name] is currently in the action stage of change. As such, her goal is to continue with weight loss efforts. She has agreed to follow the Category 2 plan or journal 1200 calories + 85 grams of protein daily. She  will keep better track of her protein intake and always eat protein first when eating meals. I also advised her to break food into several small meals if needed. She will work on decreasing soda to no more than 1 per week.  [name] has been instructed to continue her current exercise regimen for weight loss and overall health benefits. We discussed the following Behavioral Modification Strategies today: increasing lean protein intake, decrease liquid calories, keeping healthy foods in the home, emotional eating strategies, and planning for success.  [name] has agreed to follow-up with our clinic in  2 weeks. She was informed of the importance of frequent follow-up visits to maximize her success with intensive lifestyle modifications for her multiple health conditions.  ALLERGIES: Allergies  Allergen Reactions  . Hydrocodone Nausea And Vomiting     nausea and vomiting and headaches  . Myrbetriq [Mirabegron] Hives    Hives and itching  . Advil [Ibuprofen] Hives and Itching  . Amoxicillin Hives  . Cephalexin      Neuropathy  . Codeine     Crazy in the head, bp drops  . Levofloxacin Itching  . Lyrica [Pregabalin] Other (See Comments)    "Makes me like a zombie. I'm awake but I'm in slow motion."  . Meperidine Hcl     B/P drops  . Morphine And Related      States" extreme migraines"  . Oxycodone-Acetaminophen Other (See Comments)    Crazy in head, drop in bp  . Oxycodone-Acetaminophen Other (See Comments)  . Sulfonamide Derivatives Nausea And Vomiting    Stomach upset  . Valsartan-Hydrochlorothiazide Other (See Comments)    Headaches  . Adhesive [Tape] Rash    Latex in adhesive tape. Please use paper tape  . Erythromycin Diarrhea and Rash    Other reaction(s): GI Intolerance  . Penicillins Nausea And Vomiting and Rash    MEDICATIONS: Current Outpatient Medications on File Prior to Visit  Medication Sig Dispense Refill  . ALPRAZolam (XANAX) 0.25 MG tablet Take 1 tablet (0.25 mg total) by mouth daily as needed. For anxiety 30 tablet 3  . aspirin 81 MG tablet Take 81 mg by mouth daily.    [name] buPROPion (WELLBUTRIN SR) 200 MG 12 hr tablet Take 1 tablet (200 mg total) by mouth daily. 30 tablet 0  . conjugated estrogens (PREMARIN) vaginal cream Place 1 Applicatorful vaginally. 3 times a week    . DULoxetine (CYMBALTA) 60 MG capsule Take 1 capsule by mouth once daily 90 capsule 0  . fluticasone (FLONASE) 50 MCG/ACT nasal spray 2 sprays each nostril once or twice daily as neededs 16 g 12  . hydrOXYzine (ATARAX/VISTARIL) 10 MG tablet Take 1-2 tablets (10-20 mg total) by mouth 3  (three) times daily as needed for itching. 30 tablet 1  . levocetirizine (XYZAL) 5 MG tablet Take 5 mg by mouth every evening.    . methylphenidate (RITALIN) 10 MG tablet Take 1 tablet (10 mg total) by mouth as needed. 60 tablet 0  . NON FORMULARY CPAP- set on [employer].    [name] omeprazole (PRILOSEC) 20 MG capsule Take 20 mg by mouth daily.    . rosuvastatin (CRESTOR) 20 MG tablet Take 1 tablet (20 mg total) by mouth daily. 90 tablet 1  . traZODone (DESYREL) 100 MG tablet TAKE 1/2 TO 1 (ONE-HALF TO ONE) TABLET BY MOUTH ONCE DAILY AS NEEDED FOR SLEEP 90 tablet 2  . valsartan (DIOVAN) 40 MG tablet Take 40 mg by mouth daily.    [name]  Vitamin D, Ergocalciferol, (DRISDOL) 1.25 MG (50000 UT) CAPS capsule Take 1 capsule (50,000 Units total) by mouth every 3 (three) days. 10 capsule 0   No current facility-administered medications on file prior to visit.     PAST MEDICAL HISTORY: Past Medical History:  Diagnosis Date  . Allergy    rhinitis  . Anemia 02-01-12   iron absorption problem  . Arthritis 02-01-12   osteoarthritis, Back pain, spine surgeries ? retain hardware cervial and  lumbar.  . Asthma   . Back pain   . Cancer ([place]) 02-01-12   squamous cell -face  . Depression with anxiety 01/08/2011  . Diabetes ([place])   . Dyspnea   . Esophageal reflux   . Fibromyalgia   . Gallbladder problem   . Generalized headaches   . Heart murmur   . HLD (hyperlipidemia)   . HTN (hypertension)   . Hyperparathyroidism ([place]) 08/18/2014  . Joint pain   . Knee pain, right 09/20/2011  . Motion sickness 12/24/2011  . Nausea & vomiting 12/24/2011  . Nerve pain   . Obesity   . OSA (obstructive sleep apnea)   . Osteoarthritis   . Osteopenia 08/05/2014  . Panic attacks   . Pelvic floor dysfunction 09/02/2016  . Sleep apnea, obstructive   . Stress incontinence   . Varicose veins of lower limb 10/02/2011  . Vasovagal syncope 11/22/2017  . Vitamin D deficiency     PAST SURGICAL HISTORY: Past Surgical History:   Procedure Laterality Date  . ABDOMINAL SURGERY  02-01-12   ABDOMINAL SURGERY  . APPENDECTOMY  1962  . BARIATRIC SURGERY    . BLADDER SUSPENSION     complicated by bowel nick/ clolostomy  . BLEPHAROPLASTY Bilateral   . CARPAL TUNNEL RELEASE  02-01-12   right  . CATARACT EXTRACTION Bilateral   . CERVICAL DISC SURGERY     C-7 in 2004, C-4 and C-5 in 2010  . colostomy bag     Confirm with patient. Listed under medical conditions on form dated 07/18/09.  [name] COLOSTOMY REVERSAL  02-01-12  . EYE SURGERY     b/l cataracts and b/l blepharplasty  . [place]  . GASTRIC BYPASS  2003   Patient also noted "gastric bypass resection - 2004"  . HERNIA REPAIR  2009   Two hernias and abdominal reconstruction  . ivc filter     prophyllactically- no hx DVT  . LUMBAR DISC SURGERY     L1-L3 all had surgery most recently in 2017  . LUMBAR DISC SURGERY     L3-L4, Dr. Trenton Gammon  . pannilectomy     Per medical history form dated 07/18/09.  [name] TOTAL KNEE ARTHROPLASTY  02/11/2012   Procedure: TOTAL KNEE ARTHROPLASTY;  Surgeon: Mauri Pole, MD;  Location: [facility];  Service: Orthopedics;  Laterality: Right;  . ULNAR NERVE REPAIR Left 07/18/2016   by Dr.Henry Pool  . VULVA SURGERY  02-01-12   cyst removal-pt 8 months pregnant    SOCIAL HISTORY: Social History   Tobacco Use  . Smoking status: Never Smoker  . Smokeless tobacco: Never Used  Substance Use Topics  . Alcohol use: No    Alcohol/week: 0.0 standard drinks    Comment: special occasions  . Drug use: No    FAMILY HISTORY: Family History  Problem Relation Age of Onset  . Alcohol abuse Mother   . Other Mother        accidental med overdose  . Emphysema Mother  smoked  . Bipolar disorder Mother   . Sudden death Mother   . Liver disease Mother   . Other Father        CHF  . Diabetes Father   . Cancer Father        throat ca/ smoked  . Alcohol abuse Father   . Stroke Father   . Hypertension Father   . Liver disease  Father   . Hypertension Brother   . Hyperlipidemia Brother   . Obesity Brother   . Heart attack Maternal Grandfather   . Heart attack Paternal Grandmother   . Heart attack Paternal Grandfather   . Arthritis Son        psoriatic  . Arthritis Son        psoriatic  . Obesity Son    ROS: Review of Systems  Psychiatric/Behavioral: Positive for depression (emotional eating). Negative for suicidal ideas.       Negative for homicidal ideas.   PHYSICAL EXAM: Pt in no acute distress  RECENT LABS AND TESTS: BMET    Component Value Date/Time   NA 144 01/29/2018 1001   K 3.9 01/29/2018 1001   CL 105 01/29/2018 1001   CO2 23 01/29/2018 1001   GLUCOSE 83 01/29/2018 1001   GLUCOSE 105 (H) 07/10/2017 1835   BUN 16 01/29/2018 1001   CREATININE 0.67 01/29/2018 1001   CREATININE 0.83 11/28/2012 1153   CALCIUM 9.0 01/29/2018 1001   GFRNONAA 89 01/29/2018 1001   GFRAA 103 01/29/2018 1001   Lab Results  Component Value Date   HGBA1C 5.7 (H) 01/29/2018   HGBA1C 5.9 07/03/2017   HGBA1C 5.9 09/06/2016   HGBA1C 5.8 03/14/2016   HGBA1C 5.6 08/22/2015   Lab Results  Component Value Date   INSULIN 4.3 01/29/2018   CBC    Component Value Date/Time   WBC 7.0 01/29/2018 1001   WBC 13.0 (H) 07/10/2017 1835   RBC 3.84 01/29/2018 1001   RBC 3.88 07/10/2017 1835   HGB 10.8 (L) 01/29/2018 1001   HGB 12.4 02/19/2011 1425   HCT 35.0 01/29/2018 1001   HCT 37.1 02/19/2011 1425   PLT 203 07/10/2017 1835   PLT 220 02/19/2011 1425   MCV 91 01/29/2018 1001   MCV 93.7 02/19/2011 1425   MCH 28.1 01/29/2018 1001   MCH 28.6 07/10/2017 1835   MCHC 30.9 (L) 01/29/2018 1001   MCHC 32.2 07/10/2017 1835   RDW 15.0 01/29/2018 1001   RDW 13.7 02/19/2011 1425   LYMPHSABS 1.8 01/29/2018 1001   LYMPHSABS 1.1 02/19/2011 1425   MONOABS 1.1 (H) 07/10/2017 1835   MONOABS 0.5 02/19/2011 1425   EOSABS 0.5 (H) 01/29/2018 1001   BASOSABS 0.1 01/29/2018 1001   BASOSABS 0.0 02/19/2011 1425    Iron/TIBC/Ferritin/ %Sat    Component Value Date/Time   IRON 66 02/04/2015 0738   TIBC 292 02/19/2011 1425   FERRITIN 15.5 08/05/2014 0945   IRONPCTSAT 17.3 (L) 02/04/2015 0738   IRONPCTSAT 17 (L) 02/19/2011 1425   Lipid Panel     Component Value Date/Time   CHOL 114 01/29/2018 1001   TRIG 78 01/29/2018 1001   HDL 58 01/29/2018 1001   CHOLHDL 2 07/03/2017 1550   VLDL 13.2 07/03/2017 1550   LDLCALC 40 01/29/2018 1001   Hepatic Function Panel     Component Value Date/Time   PROT 6.3 01/29/2018 1001   ALBUMIN 4.2 01/29/2018 1001   AST 29 01/29/2018 1001   ALT 22 01/29/2018 1001   ALKPHOS 103  01/29/2018 1001   BILITOT 0.4 01/29/2018 1001   BILIDIR 0.1 06/01/2013 0745   IBILI 0.6 02/02/2013 0850      Component Value Date/Time   TSH 1.900 01/29/2018 1001   TSH 2.99 09/06/2016 0751   TSH 3.21 03/14/2016 0757   Results for YARITZA, LEIST (MRN [id number]) as of 05/28/2018 14:24  Ref. Range 01/29/2018 10:01  Vitamin D, 25-Hydroxy Latest Ref Range: 30.0 - 100.0 ng/mL 23.7 (L)   I, Michaelene Song, am acting as Location manager for [employer], FNP-C.  I have reviewed the above documentation for accuracy and completeness, and I agree with the above.  - Wyoma Genson, FNP-C.

## 2018-05-29 ENCOUNTER — Encounter (INDEPENDENT_AMBULATORY_CARE_PROVIDER_SITE_OTHER): Payer: Self-pay | Admitting: Family Medicine

## 2018-06-02 ENCOUNTER — Ambulatory Visit: Payer: Medicare Other | Admitting: Internal Medicine

## 2018-06-11 ENCOUNTER — Encounter (INDEPENDENT_AMBULATORY_CARE_PROVIDER_SITE_OTHER): Payer: Self-pay | Admitting: Family Medicine

## 2018-06-11 ENCOUNTER — Other Ambulatory Visit: Payer: Self-pay

## 2018-06-11 ENCOUNTER — Ambulatory Visit (INDEPENDENT_AMBULATORY_CARE_PROVIDER_SITE_OTHER): Payer: Medicare Other | Admitting: Family Medicine

## 2018-06-11 DIAGNOSIS — Z6835 Body mass index (BMI) 35.0-35.9, adult: Secondary | ICD-10-CM

## 2018-06-11 DIAGNOSIS — F3289 Other specified depressive episodes: Secondary | ICD-10-CM

## 2018-06-11 DIAGNOSIS — E559 Vitamin D deficiency, unspecified: Secondary | ICD-10-CM

## 2018-06-11 MED ORDER — VITAMIN D (ERGOCALCIFEROL) 1.25 MG (50000 UNIT) PO CAPS: 50000.0000 [IU] | ORAL_CAPSULE | ORAL | 0 refills | Status: DC

## 2018-06-11 NOTE — Progress Notes (Signed)
Office: [phone]  /  Fax: [phone] TeleHealth Visit:  [name] has verbally consented to this TeleHealth visit today. The patient is located at her second home in [place], the provider is located at the [employer] [facility] office. The participants in this visit include the listed provider and patient. The visit was conducted today via FaceTime.  HPI:   Chief Complaint: OBESITY [name] is here to discuss her progress with her obesity treatment plan. She is keeping a food journal with 1200 calories and 85 grams of protein daily and is following her eating plan approximately 80-85% of the time. She states she is walking 4-5 hours 5 times per week. [name] is struggling with getting her protein in. She has decreased her soda intake to maybe 1-2 times per week. She states she is journaling consistently and has been getting 65+ grams of protein only. She is status post duodenal switch which makes it difficult for her to get her protein in.  We were unable to weigh the patient today for this TeleHealth visit. She feels as if she has lost 2 lbs since her last visit. She has lost 12 lbs since starting treatment with [place].  Vitamin D deficiency [name] has a diagnosis of Vitamin D deficiency, which is not at goal. Her last Vitamin D level was reported to be 23.7 on 01/29/2018. She has trouble maintaining her Vitamin D level because she is status post duodenal switch. She is currently taking prescription Vit D and denies nausea, vomiting or muscle weakness.  Depression with emotional eating behaviors [name] is struggling with emotional eating and using food for comfort to the extent that it is negatively impacting her health. She has been working on behavior modification techniques to help reduce her emotional eating and has been somewhat successful. She shows no sign of suicidal or homicidal ideations. [name] reports her stress eating has been well controlled as her husband has been out-of-town  for 10 days. He causes her stress.  Depression screen [facility] 2/9 01/29/2018 11/08/2017 08/23/2016 08/22/2015 08/05/2014  Decreased Interest 3 0 0 1 0  Down, Depressed, Hopeless 2 0 0 1 0  PHQ - 2 Score 5 0 0 2 0  Altered sleeping 3 - - 0 -  Tired, decreased energy 3 - - 2 -  Change in appetite 1 - - 0 -  Feeling bad or failure about yourself  1 - - 2 -  Trouble concentrating 1 - - 0 -  Moving slowly or fidgety/restless 3 - - 0 -  Suicidal thoughts 0 - - 0 -  PHQ-9 Score 17 - - 6 -  Difficult doing work/chores Somewhat difficult - - Somewhat difficult -  Some recent data might be hidden   ASSESSMENT AND PLAN:  Vitamin D deficiency - Plan: Vitamin D, Ergocalciferol, (DRISDOL) 1.25 MG (50000 UT) CAPS capsule  Other depression - with emotional eating   Class 2 severe obesity with serious comorbidity and body mass index (BMI) of 35.0 to 35.9 in adult, unspecified obesity type (HCC)  PLAN:  Vitamin D Deficiency [name] was informed that low Vitamin D levels contributes to fatigue and are associated with obesity, breast, and colon cancer. She agrees to continue to take prescription Vit D @ 50,000 IU every three days #10 with 0 refills and will follow-up for routine testing of Vitamin D, at least 2-3 times per year. She was informed of the risk of over-replacement of Vitamin D and agrees to not increase her dose  [name] discusses this with [place] first. She has an appointment with her PCP in July and I asked her to ask her PCP to check her Vitamin D level. [name] agrees to follow-up with our clinic in 2 weeks.  Depression with Emotional Eating Behaviors We discussed behavior modification techniques today to help [name] deal with her emotional eating and depression. She will continue bupropion (no refill needed) and agrees to follow-up as directed.  Obesity [name] is currently in the action stage of change. As such, her goal is to continue with weight loss efforts. She has agreed to keep a food journal with  1200-1300 calories and 85 grams of protein. She was instructed to drink 1 protein shake per day. [name] has been instructed to continue her current exercise regimen for weight loss and overall health benefits. We discussed the following Behavioral Modification Strategies today: increasing lean protein intake, decrease liquid calories, and planning for success.  [name] has agreed to follow-up with our clinic in 2 weeks. She was informed of the importance of frequent follow-up visits to maximize her success with intensive lifestyle modifications for her multiple health conditions.  ALLERGIES: Allergies  Allergen Reactions  . Hydrocodone Nausea And Vomiting     nausea and vomiting and headaches  . Myrbetriq [Mirabegron] Hives    Hives and itching  . Advil [Ibuprofen] Hives and Itching  . Amoxicillin Hives  . Cephalexin      Neuropathy  . Codeine     Crazy in the head, bp drops  . Levofloxacin Itching  . Lyrica [Pregabalin] Other (See Comments)    "Makes me like a zombie. I'm awake but I'm in slow motion."  . Meperidine Hcl     B/P drops  . Morphine And Related      States" extreme migraines"  . Oxycodone-Acetaminophen Other (See Comments)    Crazy in head, drop in bp  . Oxycodone-Acetaminophen Other (See Comments)  . Sulfonamide Derivatives Nausea And Vomiting    Stomach upset  . Valsartan-Hydrochlorothiazide Other (See Comments)    Headaches  . Adhesive [Tape] Rash    Latex in adhesive tape. Please use paper tape  . Erythromycin Diarrhea and Rash    Other reaction(s): GI Intolerance  . Penicillins Nausea And Vomiting and Rash    MEDICATIONS: Current Outpatient Medications on File Prior to Visit  Medication Sig Dispense Refill  . ALPRAZolam (XANAX) 0.25 MG tablet Take 1 tablet (0.25 mg total) by mouth daily as needed. For anxiety 30 tablet 3  . aspirin 81 MG tablet Take 81 mg by mouth daily.    [name] buPROPion (WELLBUTRIN SR) 200 MG 12 hr tablet Take 1 tablet (200 mg total) by mouth  daily. 30 tablet 0  . conjugated estrogens (PREMARIN) vaginal cream Place 1 Applicatorful vaginally. 3 times a week    . DULoxetine (CYMBALTA) 60 MG capsule Take 1 capsule by mouth once daily 90 capsule 0  . fluticasone (FLONASE) 50 MCG/ACT nasal spray 2 sprays each nostril once or twice daily as neededs 16 g 12  . hydrOXYzine (ATARAX/VISTARIL) 10 MG tablet Take 1-2 tablets (10-20 mg total) by mouth 3 (three) times daily as needed for itching. 30 tablet 1  . levocetirizine (XYZAL) 5 MG tablet Take 5 mg by mouth every evening.    . methylphenidate (RITALIN) 10 MG tablet Take 1 tablet (10 mg total) by mouth as needed. 60 tablet 0  . NON FORMULARY CPAP- set on [employer].    [name] omeprazole (PRILOSEC) 20 MG capsule  Take 20 mg by mouth daily.    . rosuvastatin (CRESTOR) 20 MG tablet Take 1 tablet (20 mg total) by mouth daily. 90 tablet 1  . traZODone (DESYREL) 100 MG tablet TAKE 1/2 TO 1 (ONE-HALF TO ONE) TABLET BY MOUTH ONCE DAILY AS NEEDED FOR SLEEP 90 tablet 2  . valsartan (DIOVAN) 40 MG tablet Take 40 mg by mouth daily.     No current facility-administered medications on file prior to visit.     PAST MEDICAL HISTORY: Past Medical History:  Diagnosis Date  . Allergy    rhinitis  . Anemia 02-01-12   iron absorption problem  . Arthritis 02-01-12   osteoarthritis, Back pain, spine surgeries ? retain hardware cervial and  lumbar.  . Asthma   . Back pain   . Cancer ([place]) 02-01-12   squamous cell -face  . Depression with anxiety 01/08/2011  . Diabetes ([place])   . Dyspnea   . Esophageal reflux   . Fibromyalgia   . Gallbladder problem   . Generalized headaches   . Heart murmur   . HLD (hyperlipidemia)   . HTN (hypertension)   . Hyperparathyroidism ([place]) 08/18/2014  . Joint pain   . Knee pain, right 09/20/2011  . Motion sickness 12/24/2011  . Nausea & vomiting 12/24/2011  . Nerve pain   . Obesity   . OSA (obstructive sleep apnea)   . Osteoarthritis   . Osteopenia 08/05/2014  . Panic attacks    . Pelvic floor dysfunction 09/02/2016  . Sleep apnea, obstructive   . Stress incontinence   . Varicose veins of lower limb 10/02/2011  . Vasovagal syncope 11/22/2017  . Vitamin D deficiency     PAST SURGICAL HISTORY: Past Surgical History:  Procedure Laterality Date  . ABDOMINAL SURGERY  02-01-12   ABDOMINAL SURGERY  . APPENDECTOMY  1962  . BARIATRIC SURGERY    . BLADDER SUSPENSION     complicated by bowel nick/ clolostomy  . BLEPHAROPLASTY Bilateral   . CARPAL TUNNEL RELEASE  02-01-12   right  . CATARACT EXTRACTION Bilateral   . CERVICAL DISC SURGERY     C-7 in 2004, C-4 and C-5 in 2010  . colostomy bag     Confirm with patient. Listed under medical conditions on form dated 07/18/09.  [name] COLOSTOMY REVERSAL  02-01-12  . EYE SURGERY     b/l cataracts and b/l blepharplasty  . [place]  . GASTRIC BYPASS  2003   Patient also noted "gastric bypass resection - 2004"  . HERNIA REPAIR  2009   Two hernias and abdominal reconstruction  . ivc filter     prophyllactically- no hx DVT  . LUMBAR DISC SURGERY     L1-L3 all had surgery most recently in 2017  . LUMBAR DISC SURGERY     L3-L4, Dr. Trenton Gammon  . pannilectomy     Per medical history form dated 07/18/09.  [name] TOTAL KNEE ARTHROPLASTY  02/11/2012   Procedure: TOTAL KNEE ARTHROPLASTY;  Surgeon: Mauri Pole, MD;  Location: [facility];  Service: Orthopedics;  Laterality: Right;  . ULNAR NERVE REPAIR Left 07/18/2016   by Dr.Henry Pool  . VULVA SURGERY  02-01-12   cyst removal-pt 8 months pregnant    SOCIAL HISTORY: Social History   Tobacco Use  . Smoking status: Never Smoker  . Smokeless tobacco: Never Used  Substance Use Topics  . Alcohol use: No    Alcohol/week: 0.0 standard drinks    Comment: special occasions  .  Drug use: No    FAMILY HISTORY: Family History  Problem Relation Age of Onset  . Alcohol abuse Mother   . Other Mother        accidental med overdose  . Emphysema Mother        smoked  . Bipolar  disorder Mother   . Sudden death Mother   . Liver disease Mother   . Other Father        CHF  . Diabetes Father   . Cancer Father        throat ca/ smoked  . Alcohol abuse Father   . Stroke Father   . Hypertension Father   . Liver disease Father   . Hypertension Brother   . Hyperlipidemia Brother   . Obesity Brother   . Heart attack Maternal Grandfather   . Heart attack Paternal Grandmother   . Heart attack Paternal Grandfather   . Arthritis Son        psoriatic  . Arthritis Son        psoriatic  . Obesity Son    ROS: Review of Systems  Gastrointestinal: Negative for nausea and vomiting.  Musculoskeletal:       Negative for muscle weakness.  Psychiatric/Behavioral: Positive for depression (emotional eating). Negative for suicidal ideas.       Negative for homicidal ideas.   PHYSICAL EXAM: Pt in no acute distress  RECENT LABS AND TESTS: BMET    Component Value Date/Time   NA 144 01/29/2018 1001   K 3.9 01/29/2018 1001   CL 105 01/29/2018 1001   CO2 23 01/29/2018 1001   GLUCOSE 83 01/29/2018 1001   GLUCOSE 105 (H) 07/10/2017 1835   BUN 16 01/29/2018 1001   CREATININE 0.67 01/29/2018 1001   CREATININE 0.83 11/28/2012 1153   CALCIUM 9.0 01/29/2018 1001   GFRNONAA 89 01/29/2018 1001   GFRAA 103 01/29/2018 1001   Lab Results  Component Value Date   HGBA1C 5.7 (H) 01/29/2018   HGBA1C 5.9 07/03/2017   HGBA1C 5.9 09/06/2016   HGBA1C 5.8 03/14/2016   HGBA1C 5.6 08/22/2015   Lab Results  Component Value Date   INSULIN 4.3 01/29/2018   CBC    Component Value Date/Time   WBC 7.0 01/29/2018 1001   WBC 13.0 (H) 07/10/2017 1835   RBC 3.84 01/29/2018 1001   RBC 3.88 07/10/2017 1835   HGB 10.8 (L) 01/29/2018 1001   HGB 12.4 02/19/2011 1425   HCT 35.0 01/29/2018 1001   HCT 37.1 02/19/2011 1425   PLT 203 07/10/2017 1835   PLT 220 02/19/2011 1425   MCV 91 01/29/2018 1001   MCV 93.7 02/19/2011 1425   MCH 28.1 01/29/2018 1001   MCH 28.6 07/10/2017 1835   MCHC  30.9 (L) 01/29/2018 1001   MCHC 32.2 07/10/2017 1835   RDW 15.0 01/29/2018 1001   RDW 13.7 02/19/2011 1425   LYMPHSABS 1.8 01/29/2018 1001   LYMPHSABS 1.1 02/19/2011 1425   MONOABS 1.1 (H) 07/10/2017 1835   MONOABS 0.5 02/19/2011 1425   EOSABS 0.5 (H) 01/29/2018 1001   BASOSABS 0.1 01/29/2018 1001   BASOSABS 0.0 02/19/2011 1425   Iron/TIBC/Ferritin/ %Sat    Component Value Date/Time   IRON 66 02/04/2015 0738   TIBC 292 02/19/2011 1425   FERRITIN 15.5 08/05/2014 0945   IRONPCTSAT 17.3 (L) 02/04/2015 0738   IRONPCTSAT 17 (L) 02/19/2011 1425   Lipid Panel     Component Value Date/Time   CHOL 114 01/29/2018 1001   TRIG 78  01/29/2018 1001   HDL 58 01/29/2018 1001   CHOLHDL 2 07/03/2017 1550   VLDL 13.2 07/03/2017 1550   LDLCALC 40 01/29/2018 1001   Hepatic Function Panel     Component Value Date/Time   PROT 6.3 01/29/2018 1001   ALBUMIN 4.2 01/29/2018 1001   AST 29 01/29/2018 1001   ALT 22 01/29/2018 1001   ALKPHOS 103 01/29/2018 1001   BILITOT 0.4 01/29/2018 1001   BILIDIR 0.1 06/01/2013 0745   IBILI 0.6 02/02/2013 0850      Component Value Date/Time   TSH 1.900 01/29/2018 1001   TSH 2.99 09/06/2016 0751   TSH 3.21 03/14/2016 0757   Results for DAWNE, CASALI (MRN [id number]) as of 06/11/2018 14:45  Ref. Range 01/29/2018 10:01  Vitamin D, 25-Hydroxy Latest Ref Range: 30.0 - 100.0 ng/mL 23.7 (L)    I, Michaelene Song, am acting as Location manager for [employer], FNP-C.  I have reviewed the above documentation for accuracy and completeness, and I agree with the above.  - Amanat Hackel, FNP-C.

## 2018-06-12 ENCOUNTER — Ambulatory Visit (INDEPENDENT_AMBULATORY_CARE_PROVIDER_SITE_OTHER): Payer: Self-pay | Admitting: Physician Assistant

## 2018-06-12 ENCOUNTER — Encounter (INDEPENDENT_AMBULATORY_CARE_PROVIDER_SITE_OTHER): Payer: Self-pay | Admitting: Family Medicine

## 2018-06-25 ENCOUNTER — Other Ambulatory Visit: Payer: Self-pay

## 2018-06-25 ENCOUNTER — Ambulatory Visit (INDEPENDENT_AMBULATORY_CARE_PROVIDER_SITE_OTHER): Payer: Medicare Other | Admitting: Family Medicine

## 2018-06-25 ENCOUNTER — Encounter (INDEPENDENT_AMBULATORY_CARE_PROVIDER_SITE_OTHER): Payer: Self-pay | Admitting: Family Medicine

## 2018-06-25 DIAGNOSIS — Z6835 Body mass index (BMI) 35.0-35.9, adult: Secondary | ICD-10-CM | POA: Diagnosis not present

## 2018-06-25 DIAGNOSIS — F3289 Other specified depressive episodes: Secondary | ICD-10-CM

## 2018-06-25 DIAGNOSIS — E782 Mixed hyperlipidemia: Secondary | ICD-10-CM | POA: Diagnosis not present

## 2018-06-25 MED ORDER — ROSUVASTATIN CALCIUM 20 MG PO TABS: 20.0000 mg | ORAL_TABLET | Freq: Every day | ORAL | 1 refills | Status: DC

## 2018-06-25 MED ORDER — BUPROPION HCL ER (SR) 200 MG PO TB12: 200.0000 mg | ORAL_TABLET | Freq: Every day | ORAL | 0 refills | Status: DC

## 2018-06-25 NOTE — Progress Notes (Signed)
Office: [phone]  /  Fax: [phone] TeleHealth Visit:  Alferd Apa Sellitto has verbally consented to this TeleHealth visit today. The patient is located in [place], the provider is located at the [employer] [facility] office. The participants in this visit include the listed provider and patient. The visit was conducted today via FaceTime.  HPI:   Chief Complaint: OBESITY Rilda is here to discuss her progress with her obesity treatment plan. She is keeping a food journal with 1200-1300 calories and 85 grams of protein and is following her eating plan approximately 90% of the time. She states she is walking 30 minutes 4-5 times per week. Faren states her weight is 162 lbs today and she has lost 2 lbs. She is keeping calories between 1200-1300 a day and reports meeting her protein goal almost daily. She is doing very well on the plan and is happy with her progress.  We were unable to weigh the patient today for this TeleHealth visit. She feels as if she has lost 2 lbs since her last visit. She has lost 12 lbs since starting treatment with [place].  Depression with emotional eating behaviors Mood is stable. Klover reports stress eating is well controlled and her mood is stable. She shows no sign of suicidal or homicidal ideations.  Depression screen [facility] 2/9 01/29/2018 11/08/2017 08/23/2016 08/22/2015 08/05/2014  Decreased Interest 3 0 0 1 0  Down, Depressed, Hopeless 2 0 0 1 0  PHQ - 2 Score 5 0 0 2 0  Altered sleeping 3 - - 0 -  Tired, decreased energy 3 - - 2 -  Change in appetite 1 - - 0 -  Feeling bad or failure about yourself  1 - - 2 -  Trouble concentrating 1 - - 0 -  Moving slowly or fidgety/restless 3 - - 0 -  Suicidal thoughts 0 - - 0 -  PHQ-9 Score 17 - - 6 -  Difficult doing work/chores Somewhat difficult - - Somewhat difficult -  Some recent data might be hidden   Hyperlipidemia Marikay has hyperlipidemia and has been trying to improve her cholesterol levels with intensive  lifestyle modification including a low saturated fat diet, exercise and weight loss. Her LDL, triglycerides, and HDL are within normal limits. She denies any chest pain or shortness of breath.   ASSESSMENT AND PLAN:  Hyperlipidemia, mixed - Plan: rosuvastatin (CRESTOR) 20 MG tablet  Other depression - with emotional eating - Plan: buPROPion (WELLBUTRIN SR) 200 MG 12 hr tablet  Class 2 severe obesity with serious comorbidity and body mass index (BMI) of 35.0 to 35.9 in adult, unspecified obesity type ([place])  PLAN:  Depression with Emotional Eating Behaviors We discussed behavior modification techniques today to help Khristin deal with her emotional eating and depression. Matsuko was given a refill on her bupropion 200 mg QAM #30 with 0 refills and she agrees to follow-up with our clinic in 2 weeks.  Hyperlipidemia Sharryn was informed of the American Heart Association Guidelines emphasizing intensive lifestyle modifications as the first line treatment for hyperlipidemia. We discussed many lifestyle modifications today in depth, and Jaleah will continue to work on decreasing saturated fats such as fatty red meat, butter and many fried foods. Blaire was given a refill on her Crestor 20 mg daily #90 with 0 refills and she agrees to follow-up with our clinic in 2 weeks. She will also increase vegetables and lean protein in her diet and continue to work on exercise and weight loss  efforts.  Obesity Issabela is currently in the action stage of change. As such, her goal is to continue with weight loss efforts. She has agreed to keep a food journal with 1200-1300 calories and 85 grams of protein.  Ahleah has been instructed to continue her current exercise regimen for weight loss and overall health benefits. We discussed the following Behavioral Modification Strategies today: increasing lean protein intake, planning for success, and keep a strict food journal.  Esha has agreed to follow-up with our clinic in 2 weeks.  She was informed of the importance of frequent follow-up visits to maximize her success with intensive lifestyle modifications for her multiple health conditions.  ALLERGIES: Allergies  Allergen Reactions  . Hydrocodone Nausea And Vomiting     nausea and vomiting and headaches  . Myrbetriq [Mirabegron] Hives    Hives and itching  . Advil [Ibuprofen] Hives and Itching  . Amoxicillin Hives  . Cephalexin      Neuropathy  . Codeine     Crazy in the head, bp drops  . Levofloxacin Itching  . Lyrica [Pregabalin] Other (See Comments)    "Makes me like a zombie. I'm awake but I'm in slow motion."  . Meperidine Hcl     B/P drops  . Morphine And Related      States" extreme migraines"  . Oxycodone-Acetaminophen Other (See Comments)    Crazy in head, drop in bp  . Oxycodone-Acetaminophen Other (See Comments)  . Sulfonamide Derivatives Nausea And Vomiting    Stomach upset  . Valsartan-Hydrochlorothiazide Other (See Comments)    Headaches  . Adhesive [Tape] Rash    Latex in adhesive tape. Please use paper tape  . Erythromycin Diarrhea and Rash    Other reaction(s): GI Intolerance  . Penicillins Nausea And Vomiting and Rash    MEDICATIONS: Current Outpatient Medications on File Prior to Visit  Medication Sig Dispense Refill  . ALPRAZolam (XANAX) 0.25 MG tablet Take 1 tablet (0.25 mg total) by mouth daily as needed. For anxiety 30 tablet 3  . aspirin 81 MG tablet Take 81 mg by mouth daily.    [name] conjugated estrogens (PREMARIN) vaginal cream Place 1 Applicatorful vaginally. 3 times a week    . DULoxetine (CYMBALTA) 60 MG capsule Take 1 capsule by mouth once daily 90 capsule 0  . fluticasone (FLONASE) 50 MCG/ACT nasal spray 2 sprays each nostril once or twice daily as neededs 16 g 12  . hydrOXYzine (ATARAX/VISTARIL) 10 MG tablet Take 1-2 tablets (10-20 mg total) by mouth 3 (three) times daily as needed for itching. 30 tablet 1  . levocetirizine (XYZAL) 5 MG tablet Take 5 mg by mouth every  evening.    . methylphenidate (RITALIN) 10 MG tablet Take 1 tablet (10 mg total) by mouth as needed. 60 tablet 0  . NON FORMULARY CPAP- set on 12 [employer].    [name] omeprazole (PRILOSEC) 20 MG capsule Take 20 mg by mouth daily.    . traZODone (DESYREL) 100 MG tablet TAKE 1/2 TO 1 (ONE-HALF TO ONE) TABLET BY MOUTH ONCE DAILY AS NEEDED FOR SLEEP 90 tablet 2  . valsartan (DIOVAN) 40 MG tablet Take 40 mg by mouth daily.    . Vitamin D, Ergocalciferol, (DRISDOL) 1.25 MG (50000 UT) CAPS capsule Take 1 capsule (50,000 Units total) by mouth every 3 (three) days. 10 capsule 0   No current facility-administered medications on file prior to visit.     PAST MEDICAL HISTORY: Past Medical History:  Diagnosis Date  .  Allergy    rhinitis  . Anemia 02-01-12   iron absorption problem  . Arthritis 02-01-12   osteoarthritis, Back pain, spine surgeries ? retain hardware cervial and  lumbar.  . Asthma   . Back pain   . Cancer ([place]) 02-01-12   squamous cell -face  . Depression with anxiety 01/08/2011  . Diabetes ([place])   . Dyspnea   . Esophageal reflux   . Fibromyalgia   . Gallbladder problem   . Generalized headaches   . Heart murmur   . HLD (hyperlipidemia)   . HTN (hypertension)   . Hyperparathyroidism ([place]) 08/18/2014  . Joint pain   . Knee pain, right 09/20/2011  . Motion sickness 12/24/2011  . Nausea & vomiting 12/24/2011  . Nerve pain   . Obesity   . OSA (obstructive sleep apnea)   . Osteoarthritis   . Osteopenia 08/05/2014  . Panic attacks   . Pelvic floor dysfunction 09/02/2016  . Sleep apnea, obstructive   . Stress incontinence   . Varicose veins of lower limb 10/02/2011  . Vasovagal syncope 11/22/2017  . Vitamin D deficiency     PAST SURGICAL HISTORY: Past Surgical History:  Procedure Laterality Date  . ABDOMINAL SURGERY  02-01-12   ABDOMINAL SURGERY  . APPENDECTOMY  1962  . BARIATRIC SURGERY    . BLADDER SUSPENSION     complicated by bowel nick/ clolostomy  . BLEPHAROPLASTY Bilateral    . CARPAL TUNNEL RELEASE  02-01-12   right  . CATARACT EXTRACTION Bilateral   . CERVICAL DISC SURGERY     C-7 in 2004, C-4 and C-5 in 2010  . colostomy bag     Confirm with patient. Listed under medical conditions on form dated 07/18/09.  [name] COLOSTOMY REVERSAL  02-01-12  . EYE SURGERY     b/l cataracts and b/l blepharplasty  . [place]  . GASTRIC BYPASS  2003   Patient also noted "gastric bypass resection - 2004"  . HERNIA REPAIR  2009   Two hernias and abdominal reconstruction  . ivc filter     prophyllactically- no hx DVT  . LUMBAR DISC SURGERY     L1-L3 all had surgery most recently in 2017  . LUMBAR DISC SURGERY     L3-L4, Dr. Trenton Gammon  . pannilectomy     Per medical history form dated 07/18/09.  [name] TOTAL KNEE ARTHROPLASTY  02/11/2012   Procedure: TOTAL KNEE ARTHROPLASTY;  Surgeon: Mauri Pole, MD;  Location: [facility];  Service: Orthopedics;  Laterality: Right;  . ULNAR NERVE REPAIR Left 07/18/2016   by Dr.Henry Pool  . VULVA SURGERY  02-01-12   cyst removal-pt 8 months pregnant    SOCIAL HISTORY: Social History   Tobacco Use  . Smoking status: Never Smoker  . Smokeless tobacco: Never Used  Substance Use Topics  . Alcohol use: No    Alcohol/week: 0.0 standard drinks    Comment: special occasions  . Drug use: No    FAMILY HISTORY: Family History  Problem Relation Age of Onset  . Alcohol abuse Mother   . Other Mother        accidental med overdose  . Emphysema Mother        smoked  . Bipolar disorder Mother   . Sudden death Mother   . Liver disease Mother   . Other Father        CHF  . Diabetes Father   . Cancer Father  throat ca/ smoked  . Alcohol abuse Father   . Stroke Father   . Hypertension Father   . Liver disease Father   . Hypertension Brother   . Hyperlipidemia Brother   . Obesity Brother   . Heart attack Maternal Grandfather   . Heart attack Paternal Grandmother   . Heart attack Paternal Grandfather   . Arthritis Son         psoriatic  . Arthritis Son        psoriatic  . Obesity Son    ROS: Review of Systems  Respiratory: Negative for shortness of breath.   Cardiovascular: Negative for chest pain.  Psychiatric/Behavioral: Positive for depression (emotional eating). Negative for suicidal ideas.       Negative for homicidal ideas.   PHYSICAL EXAM: Pt in no acute distress  RECENT LABS AND TESTS: BMET    Component Value Date/Time   NA 144 01/29/2018 1001   K 3.9 01/29/2018 1001   CL 105 01/29/2018 1001   CO2 23 01/29/2018 1001   GLUCOSE 83 01/29/2018 1001   GLUCOSE 105 (H) 07/10/2017 1835   BUN 16 01/29/2018 1001   CREATININE 0.67 01/29/2018 1001   CREATININE 0.83 11/28/2012 1153   CALCIUM 9.0 01/29/2018 1001   GFRNONAA 89 01/29/2018 1001   GFRAA 103 01/29/2018 1001   Lab Results  Component Value Date   HGBA1C 5.7 (H) 01/29/2018   HGBA1C 5.9 07/03/2017   HGBA1C 5.9 09/06/2016   HGBA1C 5.8 03/14/2016   HGBA1C 5.6 08/22/2015   Lab Results  Component Value Date   INSULIN 4.3 01/29/2018   CBC    Component Value Date/Time   WBC 7.0 01/29/2018 1001   WBC 13.0 (H) 07/10/2017 1835   RBC 3.84 01/29/2018 1001   RBC 3.88 07/10/2017 1835   HGB 10.8 (L) 01/29/2018 1001   HGB 12.4 02/19/2011 1425   HCT 35.0 01/29/2018 1001   HCT 37.1 02/19/2011 1425   PLT 203 07/10/2017 1835   PLT 220 02/19/2011 1425   MCV 91 01/29/2018 1001   MCV 93.7 02/19/2011 1425   MCH 28.1 01/29/2018 1001   MCH 28.6 07/10/2017 1835   MCHC 30.9 (L) 01/29/2018 1001   MCHC 32.2 07/10/2017 1835   RDW 15.0 01/29/2018 1001   RDW 13.7 02/19/2011 1425   LYMPHSABS 1.8 01/29/2018 1001   LYMPHSABS 1.1 02/19/2011 1425   MONOABS 1.1 (H) 07/10/2017 1835   MONOABS 0.5 02/19/2011 1425   EOSABS 0.5 (H) 01/29/2018 1001   BASOSABS 0.1 01/29/2018 1001   BASOSABS 0.0 02/19/2011 1425   Iron/TIBC/Ferritin/ %Sat    Component Value Date/Time   IRON 66 02/04/2015 0738   TIBC 292 02/19/2011 1425   FERRITIN 15.5 08/05/2014 0945    IRONPCTSAT 17.3 (L) 02/04/2015 0738   IRONPCTSAT 17 (L) 02/19/2011 1425   Lipid Panel     Component Value Date/Time   CHOL 114 01/29/2018 1001   TRIG 78 01/29/2018 1001   HDL 58 01/29/2018 1001   CHOLHDL 2 07/03/2017 1550   VLDL 13.2 07/03/2017 1550   LDLCALC 40 01/29/2018 1001   Hepatic Function Panel     Component Value Date/Time   PROT 6.3 01/29/2018 1001   ALBUMIN 4.2 01/29/2018 1001   AST 29 01/29/2018 1001   ALT 22 01/29/2018 1001   ALKPHOS 103 01/29/2018 1001   BILITOT 0.4 01/29/2018 1001   BILIDIR 0.1 06/01/2013 0745   IBILI 0.6 02/02/2013 0850      Component Value Date/Time   TSH 1.900 01/29/2018  1001   TSH 2.99 09/06/2016 0751   TSH 3.21 03/14/2016 0757   Results for KATHLEENA, FREEMAN (MRN [id number]) as of 06/25/2018 15:09  Ref. Range 01/29/2018 10:01  Vitamin D, 25-Hydroxy Latest Ref Range: 30.0 - 100.0 ng/mL 23.7 (L)    I, Michaelene Song, am acting as Location manager for [employer], FNP-C.  I have reviewed the above documentation for accuracy and completeness, and I agree with the above.  -  , FNP-C.

## 2018-06-26 ENCOUNTER — Encounter (INDEPENDENT_AMBULATORY_CARE_PROVIDER_SITE_OTHER): Payer: Self-pay | Admitting: Family Medicine

## 2018-07-13 ENCOUNTER — Encounter: Payer: Self-pay | Admitting: Internal Medicine

## 2018-07-14 ENCOUNTER — Other Ambulatory Visit: Payer: Self-pay

## 2018-07-14 ENCOUNTER — Ambulatory Visit (INDEPENDENT_AMBULATORY_CARE_PROVIDER_SITE_OTHER): Payer: Medicare Other | Admitting: Family Medicine

## 2018-07-14 ENCOUNTER — Encounter (INDEPENDENT_AMBULATORY_CARE_PROVIDER_SITE_OTHER): Payer: Self-pay | Admitting: Family Medicine

## 2018-07-14 DIAGNOSIS — D508 Other iron deficiency anemias: Secondary | ICD-10-CM

## 2018-07-14 DIAGNOSIS — Z6835 Body mass index (BMI) 35.0-35.9, adult: Secondary | ICD-10-CM

## 2018-07-14 DIAGNOSIS — F3289 Other specified depressive episodes: Secondary | ICD-10-CM | POA: Diagnosis not present

## 2018-07-14 DIAGNOSIS — F418 Other specified anxiety disorders: Secondary | ICD-10-CM | POA: Diagnosis not present

## 2018-07-14 DIAGNOSIS — R7303 Prediabetes: Secondary | ICD-10-CM

## 2018-07-14 DIAGNOSIS — E559 Vitamin D deficiency, unspecified: Secondary | ICD-10-CM | POA: Diagnosis not present

## 2018-07-14 MED ORDER — VITAMIN D (ERGOCALCIFEROL) 1.25 MG (50000 UNIT) PO CAPS: 50000.0000 [IU] | ORAL_CAPSULE | ORAL | 0 refills | Status: DC

## 2018-07-14 MED ORDER — BUPROPION HCL ER (SR) 200 MG PO TB12: 200.0000 mg | ORAL_TABLET | Freq: Every day | ORAL | 0 refills | Status: DC

## 2018-07-14 MED ORDER — DULOXETINE HCL 60 MG PO CPEP: 60.0000 mg | ORAL_CAPSULE | Freq: Every day | ORAL | 0 refills | Status: DC

## 2018-07-15 NOTE — Progress Notes (Signed)
Office: [phone]  /  Fax: [phone] TeleHealth Visit:  [name] has verbally consented to this TeleHealth visit today. The patient is located at home in [facility], the provider is located at the [employer] [facility] office. The participants in this visit include the listed provider and patient and any and all parties involved. The visit was conducted today via FaceTime.  HPI:   Chief Complaint: OBESITY [name] is here to discuss her progress with her obesity treatment plan. She is on the keep a food journal with 1200 to 1300 calories and 85 grams of protein daily plan and is following her eating plan approximately 100 % of the time. She states she is walking 30 minutes 7 times per week. Velna has lost 2 pounds and her weight is 160 pounds at home today. She is doing a wonderful job with sticking to the plan. She is journaling 100 % of the time. She meets her protein goals daily. We were unable to weigh the patient today for this TeleHealth visit. She feels as if she has lost weight since her last visit. She has lost 26 lbs since starting treatment with [place].  Vitamin D deficiency [name] has a diagnosis of vitamin D deficiency. Her last vitamin D level was at 23.7 on 01/29/18 and was not at goal. She has had problems with vitamin D absorption, due to duodenal switch in 2003. She is currently taking vit D and denies nausea, vomiting or muscle weakness.  Depression with Anxiety [name] is well controlled at this point with Bupropion and Cymbalta. Her mood is stable on Cymbalta. She shows no sign of suicidal or homicidal ideations.  Pre-Diabetes [name] has a diagnosis of prediabetes based on her elevated Hgb A1c and was informed this puts her at greater risk of developing diabetes. She is not on metformin currently and continues to work on diet and exercise to decrease risk of diabetes. She denies polyphagia.  IDA (iron deficiency anemia) [name] has a diagnosis of anemia. She is not on  oral iron because she cannot tolerate oral iron due to GI upset. Her last Hgb was at 10.8. She is status post duodenal switch in 2003.   ASSESSMENT AND PLAN:  Vitamin D deficiency - Plan: VITAMIN D 25 Hydroxy (Vit-D Deficiency, Fractures), Vitamin D, Ergocalciferol, (DRISDOL) 1.25 MG (50000 UT) CAPS capsule  Prediabetes - Plan: Hemoglobin A1c, Insulin, random  Other iron deficiency anemia - Plan: Anemia panel, CBC With Differential  Depression with anxiety  Class 2 severe obesity with serious comorbidity and body mass index (BMI) of 35.0 to 35.9 in adult, unspecified obesity type (HCC)  Other depression - with emotional eating - Plan: buPROPion (WELLBUTRIN SR) 200 MG 12 hr tablet  PLAN:  Vitamin D Deficiency [name] was informed that low vitamin D levels contributes to fatigue and are associated with obesity, breast, and colon cancer. She agrees to continue to take prescription Vit D @50 ,000 IU every 3 days #10 with no refills and will follow up for routine testing of vitamin D, at least 2-3 times per year. She was informed of the risk of over-replacement of vitamin D and agrees to not increase her dose unless she discusses this with [place] first. Sande agrees to follow up as directed.  Depression with Anxiety We discussed behavior modification techniques today to help [name] deal with her anxiety and depression. She has agreed to continue Bupropion SR 200 mg Qam #30 with no refills and Duloxetine 60 mg daily #90 with no  refills and follow up as directed.  Pre-Diabetes [name] will continue to work on weight loss, exercise, and decreasing simple carbohydrates in her diet to help decrease the risk of diabetes. She was informed that eating too many simple carbohydrates or too many calories at one sitting increases the likelihood of GI side effects. We will check Hgb A1c and insulin level and [name] agreed to follow up with [place] as directed to monitor her progress.  IDA (iron deficiency anemia) The diagnosis  of Iron deficiency anemia was discussed with [name] and was explained in detail. She was given suggestions of iron rich foods. We will check CBC and anemia panel and [name] will follow up as directed.  Obesity [name] is currently in the action stage of change. As such, her goal is to continue with weight loss efforts She has agreed to keep a food journal with 1200 to 1300 calories and 85 grams of protein daily [name] will continue her current exercise regimen for weight loss and overall health benefits. We discussed the following Behavioral Modification Strategies today: planning for success and keep a strict food journal  [name] has agreed to follow up with our clinic in 2 to 3 weeks. She was informed of the importance of frequent follow up visits to maximize her success with intensive lifestyle modifications for her multiple health conditions.  ALLERGIES: Allergies  Allergen Reactions  . Hydrocodone Nausea And Vomiting     nausea and vomiting and headaches  . Myrbetriq [Mirabegron] Hives    Hives and itching  . Advil [Ibuprofen] Hives and Itching  . Amoxicillin Hives  . Cephalexin      Neuropathy  . Codeine     Crazy in the head, bp drops  . Levofloxacin Itching  . Lyrica [Pregabalin] Other (See Comments)    "Makes me like a zombie. [name]'m awake but [name]'m in slow motion."  . Meperidine Hcl     B/P drops  . Morphine And Related      States" extreme migraines"  . Oxycodone-Acetaminophen Other (See Comments)    Crazy in head, drop in bp  . Oxycodone-Acetaminophen Other (See Comments)  . Sulfonamide Derivatives Nausea And Vomiting    Stomach upset  . Valsartan-Hydrochlorothiazide Other (See Comments)    Headaches  . Adhesive [Tape] Rash    Latex in adhesive tape. Please use paper tape  . Erythromycin Diarrhea and Rash    Other reaction(s): GI Intolerance  . Penicillins Nausea And Vomiting and Rash    MEDICATIONS: Current Outpatient Medications on File Prior to Visit  Medication Sig  Dispense Refill  . ALPRAZolam (XANAX) 0.25 MG tablet Take 1 tablet (0.25 mg total) by mouth daily as needed. For anxiety 30 tablet 3  . aspirin 81 MG tablet Take 81 mg by mouth daily.    [name] conjugated estrogens (PREMARIN) vaginal cream Place 1 Applicatorful vaginally. 3 times a week    . fluticasone (FLONASE) 50 MCG/ACT nasal spray 2 sprays each nostril once or twice daily as neededs 16 g 12  . hydrOXYzine (ATARAX/VISTARIL) 10 MG tablet Take 1-2 tablets (10-20 mg total) by mouth 3 (three) times daily as needed for itching. 30 tablet 1  . levocetirizine (XYZAL) 5 MG tablet Take 5 mg by mouth every evening.    . methylphenidate (RITALIN) 10 MG tablet Take 1 tablet (10 mg total) by mouth as needed. 60 tablet 0  . NON FORMULARY CPAP- set on [employer].    [name] omeprazole (PRILOSEC) 20 MG capsule Take 20 mg  by mouth daily.    . rosuvastatin (CRESTOR) 20 MG tablet Take 1 tablet (20 mg total) by mouth daily. 90 tablet 1  . traZODone (DESYREL) 100 MG tablet TAKE 1/2 TO 1 (ONE-HALF TO ONE) TABLET BY MOUTH ONCE DAILY AS NEEDED FOR SLEEP 90 tablet 2  . valsartan (DIOVAN) 40 MG tablet Take 40 mg by mouth daily.     No current facility-administered medications on file prior to visit.     PAST MEDICAL HISTORY: Past Medical History:  Diagnosis Date  . Allergy    rhinitis  . Anemia 02-01-12   iron absorption problem  . Arthritis 02-01-12   osteoarthritis, Back pain, spine surgeries ? retain hardware cervial and  lumbar.  . Asthma   . Back pain   . Cancer ([place]) 02-01-12   squamous cell -face  . Depression with anxiety 01/08/2011  . Diabetes ([place])   . Dyspnea   . Esophageal reflux   . Fibromyalgia   . Gallbladder problem   . Generalized headaches   . Heart murmur   . HLD (hyperlipidemia)   . HTN (hypertension)   . Hyperparathyroidism ([place]) 08/18/2014  . Joint pain   . Knee pain, right 09/20/2011  . Motion sickness 12/24/2011  . Nausea & vomiting 12/24/2011  . Nerve pain   . Obesity   . OSA  (obstructive sleep apnea)   . Osteoarthritis   . Osteopenia 08/05/2014  . Panic attacks   . Pelvic floor dysfunction 09/02/2016  . Sleep apnea, obstructive   . Stress incontinence   . Varicose veins of lower limb 10/02/2011  . Vasovagal syncope 11/22/2017  . Vitamin D deficiency     PAST SURGICAL HISTORY: Past Surgical History:  Procedure Laterality Date  . ABDOMINAL SURGERY  02-01-12   ABDOMINAL SURGERY  . APPENDECTOMY  1962  . BARIATRIC SURGERY    . BLADDER SUSPENSION     complicated by bowel nick/ clolostomy  . BLEPHAROPLASTY Bilateral   . CARPAL TUNNEL RELEASE  02-01-12   right  . CATARACT EXTRACTION Bilateral   . CERVICAL DISC SURGERY     C-7 in 2004, C-4 and C-5 in 2010  . colostomy bag     Confirm with patient. Listed under medical conditions on form dated 07/18/09.  [name] COLOSTOMY REVERSAL  02-01-12  . EYE SURGERY     b/l cataracts and b/l blepharplasty  . [place]  . GASTRIC BYPASS  2003   Patient also noted "gastric bypass resection - 2004"  . HERNIA REPAIR  2009   Two hernias and abdominal reconstruction  . ivc filter     prophyllactically- no hx DVT  . LUMBAR DISC SURGERY     L1-L3 all had surgery most recently in 2017  . LUMBAR DISC SURGERY     L3-L4, Dr. Trenton Gammon  . pannilectomy     Per medical history form dated 07/18/09.  [name] TOTAL KNEE ARTHROPLASTY  02/11/2012   Procedure: TOTAL KNEE ARTHROPLASTY;  Surgeon: Mauri Pole, MD;  Location: [facility];  Service: Orthopedics;  Laterality: Right;  . ULNAR NERVE REPAIR Left 07/18/2016   by Dr.Henry Pool  . VULVA SURGERY  02-01-12   cyst removal-pt 8 months pregnant    SOCIAL HISTORY: Social History   Tobacco Use  . Smoking status: Never Smoker  . Smokeless tobacco: Never Used  Substance Use Topics  . Alcohol use: No    Alcohol/week: 0.0 standard drinks    Comment: special occasions  . Drug use: No  FAMILY HISTORY: Family History  Problem Relation Age of Onset  . Alcohol abuse Mother   .  Other Mother        accidental med overdose  . Emphysema Mother        smoked  . Bipolar disorder Mother   . Sudden death Mother   . Liver disease Mother   . Other Father        CHF  . Diabetes Father   . Cancer Father        throat ca/ smoked  . Alcohol abuse Father   . Stroke Father   . Hypertension Father   . Liver disease Father   . Hypertension Brother   . Hyperlipidemia Brother   . Obesity Brother   . Heart attack Maternal Grandfather   . Heart attack Paternal Grandmother   . Heart attack Paternal Grandfather   . Arthritis Son        psoriatic  . Arthritis Son        psoriatic  . Obesity Son     ROS: Review of Systems  Constitutional: Positive for weight loss.  Gastrointestinal: Negative for nausea and vomiting.  Musculoskeletal:       Negative for muscle weakness  Endo/Heme/Allergies:       Negative for polyphagia  Psychiatric/Behavioral: Positive for depression. Negative for suicidal ideas.    PHYSICAL EXAM: Pt in no acute distress  RECENT LABS AND TESTS: BMET    Component Value Date/Time   NA 144 01/29/2018 1001   K 3.9 01/29/2018 1001   CL 105 01/29/2018 1001   CO2 23 01/29/2018 1001   GLUCOSE 83 01/29/2018 1001   GLUCOSE 105 (H) 07/10/2017 1835   BUN 16 01/29/2018 1001   CREATININE 0.67 01/29/2018 1001   CREATININE 0.83 11/28/2012 1153   CALCIUM 9.0 01/29/2018 1001   GFRNONAA 89 01/29/2018 1001   GFRAA 103 01/29/2018 1001   Lab Results  Component Value Date   HGBA1C 5.7 (H) 01/29/2018   HGBA1C 5.9 07/03/2017   HGBA1C 5.9 09/06/2016   HGBA1C 5.8 03/14/2016   HGBA1C 5.6 08/22/2015   Lab Results  Component Value Date   INSULIN 4.3 01/29/2018   CBC    Component Value Date/Time   WBC 7.0 01/29/2018 1001   WBC 13.0 (H) 07/10/2017 1835   RBC 3.84 01/29/2018 1001   RBC 3.88 07/10/2017 1835   HGB 10.8 (L) 01/29/2018 1001   HGB 12.4 02/19/2011 1425   HCT 35.0 01/29/2018 1001   HCT 37.1 02/19/2011 1425   PLT 203 07/10/2017 1835   PLT  220 02/19/2011 1425   MCV 91 01/29/2018 1001   MCV 93.7 02/19/2011 1425   MCH 28.1 01/29/2018 1001   MCH 28.6 07/10/2017 1835   MCHC 30.9 (L) 01/29/2018 1001   MCHC 32.2 07/10/2017 1835   RDW 15.0 01/29/2018 1001   RDW 13.7 02/19/2011 1425   LYMPHSABS 1.8 01/29/2018 1001   LYMPHSABS 1.1 02/19/2011 1425   MONOABS 1.1 (H) 07/10/2017 1835   MONOABS 0.5 02/19/2011 1425   EOSABS 0.5 (H) 01/29/2018 1001   BASOSABS 0.1 01/29/2018 1001   BASOSABS 0.0 02/19/2011 1425   Iron/TIBC/Ferritin/ %Sat    Component Value Date/Time   IRON 66 02/04/2015 0738   TIBC 292 02/19/2011 1425   FERRITIN 15.5 08/05/2014 0945   IRONPCTSAT 17.3 (L) 02/04/2015 0738   IRONPCTSAT 17 (L) 02/19/2011 1425   Lipid Panel     Component Value Date/Time   CHOL 114 01/29/2018 1001   TRIG  78 01/29/2018 1001   HDL 58 01/29/2018 1001   CHOLHDL 2 07/03/2017 1550   VLDL 13.2 07/03/2017 1550   LDLCALC 40 01/29/2018 1001   Hepatic Function Panel     Component Value Date/Time   PROT 6.3 01/29/2018 1001   ALBUMIN 4.2 01/29/2018 1001   AST 29 01/29/2018 1001   ALT 22 01/29/2018 1001   ALKPHOS 103 01/29/2018 1001   BILITOT 0.4 01/29/2018 1001   BILIDIR 0.1 06/01/2013 0745   IBILI 0.6 02/02/2013 0850      Component Value Date/Time   TSH 1.900 01/29/2018 1001   TSH 2.99 09/06/2016 0751   TSH 3.21 03/14/2016 0757     Ref. Range 01/29/2018 10:01  Vitamin D, 25-Hydroxy Latest Ref Range: 30.0 - 100.0 ng/mL 23.7 (L)    [name], [name], am acting as Location manager for [employer], FNP-C  [name] have reviewed the above documentation for accuracy and completeness, and [name] agree with the above.  - Gail Corriher, FNP-C.

## 2018-07-16 ENCOUNTER — Ambulatory Visit (INDEPENDENT_AMBULATORY_CARE_PROVIDER_SITE_OTHER): Payer: Medicare Other | Admitting: Internal Medicine

## 2018-07-16 ENCOUNTER — Encounter: Payer: Self-pay | Admitting: Internal Medicine

## 2018-07-16 ENCOUNTER — Other Ambulatory Visit: Payer: Self-pay

## 2018-07-16 VITALS — BP 118/60 | HR 56 | Temp 98.8°F | Ht 60.0 in | Wt 161.4 lb

## 2018-07-16 DIAGNOSIS — J302 Other seasonal allergic rhinitis: Secondary | ICD-10-CM | POA: Diagnosis not present

## 2018-07-16 DIAGNOSIS — J3089 Other allergic rhinitis: Secondary | ICD-10-CM | POA: Diagnosis not present

## 2018-07-16 DIAGNOSIS — I6522 Occlusion and stenosis of left carotid artery: Secondary | ICD-10-CM | POA: Diagnosis not present

## 2018-07-16 DIAGNOSIS — N3281 Overactive bladder: Secondary | ICD-10-CM | POA: Diagnosis not present

## 2018-07-16 DIAGNOSIS — G4733 Obstructive sleep apnea (adult) (pediatric): Secondary | ICD-10-CM

## 2018-07-16 NOTE — Patient Instructions (Signed)
Order- DME [facility]- please change CPAP to auto 5-15, refit mask due to weight loss, continue humidifier, supplies. [facility]/ card  [facility] to continue Flonase  For more drying effect, you might try otc chlortrimeton instead of Xyzal as an antihistamine.  Please call if we can help

## 2018-07-16 NOTE — Progress Notes (Signed)
Subjective:    Patient ID: [name], female    DOB: 01/17/1946, 73 y.o.   MRN: [id number]  HPI female never smoker followed for allergic rhinitis, asthma, OSA, Narcolepsy, complicated by GERD, DM, HBP, obesity/ bariatric surgery, pacemaker PFT 11/24/07- WNL FEV1/FVC 0.87 NPSG 03/23/1999- AHI 156/ hr, weight 202lbs before bariatric surgery, CPAP recently 12. [facility] screen 07/24/12- AHI 2.0/ hr, weight 209 lbs NPSG 01/25/13- AHI 7.6/ hr, mild OSA, weight 213 lbs. MSLT 01/26/13- mean latency 0.9 minutes, SOREM 1/5 naps. Pathologic daytime sleepiness consistent with narcolepsy or idiopathic, but nonspecific because she had taken Klonopin and Lamictal the night before, Cymbalta and Xyzal the day of the test.  ---------------------------------------------------------------------------------- 04/11/17- 73 year old female never smoker followed for allergic rhinitis, asthma, OSA/Narcolepsy, complicated by GERD, DM, HBP, obesity/bariatric surgery, pacemaker CPAP auto 5-20/ [facility] ----OSA: DME: [facility]-Pt wears CPAP nightly but continues to wake up very tired and has had trouble with mask-can not get good mask fit from DME. No DL today-will need to request one.  She is retired now with more flexibility about her sleep schedule.  Ritalin not using.  She is more aware of waking tired.  Falls asleep easily if quiet but not with any distraction like driving.  No cataplexy.  Does think she is better off with CPAP-sleeps better.  Not satisfied with mask fit. Download 80% compliance AHI is 2.9/hour.  Full facemask.  07/16/2018- 73 year old female never smoker followed for allergic rhinitis, asthma, OSA/Narcolepsy, complicated by GERD, DM, HBP, obesity/bariatric surgery, pacemaker CPAP auto 5-20/ [facility] Download compliance 83%, AHI 7.1/ hr Body weight today 161 lbs (was 175 lb in Feb) -----OSA on CPAP; DME: [facility], pt report increased air from machine and states it is uncomfortable for  her. Working with a medical weight loss program and has dieted off 14 lbs. Mask leaking more and pressure seems too high, although using most of the current range. Persistent post nasal drip sensation on Xyzal/ Flonase. No asthma at all in a long time, well [place]  Review of Systems-see HPI + = positive Constitutional:   +  weight loss, night sweats, fevers, chills, +fatigue, lassitude. HEENT:   No-  headaches, difficulty swallowing, tooth/dental problems, sore throat,       No-  sneezing, itching, ear ache,  nasal congestion, +post nasal drip,  CV:  No-   chest pain, orthopnea, PND, swelling in lower extremities, anasarca, dizziness, palpitations Resp: No-   shortness of breath with exertion or at rest.              No- productive cough,   non-productive cough,  No-  coughing up of blood.              No-   change in color of mucus.  No- wheezing.   Skin: No-   rash or lesions. GI:  No-   heartburn, indigestion, abdominal pain, nausea, vomiting, GU:  MS:  No-   joint pain or swelling.   Neuro- nothing unusual Psych:  No- change in mood or affect. No depression or anxiety.  No memory loss.    Objective:   Physical Exam General- Alert, Oriented, Affect-appropriate, Distress- none acute, + still overweight Skin- rash-none, lesions- none, excoriation-none Lymphadenopathy- none Head- atraumatic            Eyes- Gross vision intact, PERRLA, conjunctivae clear, with watery thin secretions            Ears- Hearing,  canals normal, TMs normal            Nose- Clear, No- Septal dev, mucus, polyps, erosion, perforation,             Throat- Mallampati III , mucosa clear , drainage- none, tonsils- atrophic Neck- flexible , trachea midline, no stridor , thyroid nl, carotid no bruit Chest - symmetrical excursion , unlabored           Heart/CV- RRR , +trace systolic murmur? TI or AS , no gallop  , no rub, nl s1s2                           - JVD- none ,  edema- none, stasis changes- none, varices- right calf           Lung-  Clear chest, Cough-none, wheeze- none , dullness-none, rub- none           Chest wall-  Abd-  Br/ Gen/ Rectal- Not done, not indicated Extrem- cyanosis- none, clubbing, none, atrophy- none, strength- nl Neuro- grossly intact to observation  Assessment & Plan:

## 2018-07-16 NOTE — Assessment & Plan Note (Addendum)
Weight loss is changing optimum CPAP setting, including facial contours for mask fit. She anticipates losing more weight. Plan- change autopap to 5-15 and refit mask for better seal since she has lost weight.

## 2018-07-16 NOTE — Assessment & Plan Note (Signed)
Suggested she try chlortrimeton for more drying effect. Since it will likely make her drowsy, she can use it at night when drip sensation is worst.

## 2018-07-17 ENCOUNTER — Encounter (INDEPENDENT_AMBULATORY_CARE_PROVIDER_SITE_OTHER): Payer: Self-pay | Admitting: Family Medicine

## 2018-07-18 DIAGNOSIS — G5781 Other specified mononeuropathies of right lower limb: Secondary | ICD-10-CM | POA: Diagnosis not present

## 2018-07-18 DIAGNOSIS — Z471 Aftercare following joint replacement surgery: Secondary | ICD-10-CM | POA: Diagnosis not present

## 2018-07-18 DIAGNOSIS — Z96651 Presence of right artificial knee joint: Secondary | ICD-10-CM | POA: Diagnosis not present

## 2018-07-21 ENCOUNTER — Ambulatory Visit: Payer: Medicare Other

## 2018-07-29 ENCOUNTER — Other Ambulatory Visit: Payer: Self-pay

## 2018-07-29 ENCOUNTER — Ambulatory Visit (INDEPENDENT_AMBULATORY_CARE_PROVIDER_SITE_OTHER): Payer: Medicare Other | Admitting: Physician Assistant

## 2018-07-29 ENCOUNTER — Encounter (INDEPENDENT_AMBULATORY_CARE_PROVIDER_SITE_OTHER): Payer: Self-pay | Admitting: Physician Assistant

## 2018-07-29 DIAGNOSIS — E669 Obesity, unspecified: Secondary | ICD-10-CM | POA: Diagnosis not present

## 2018-07-29 DIAGNOSIS — F3289 Other specified depressive episodes: Secondary | ICD-10-CM

## 2018-07-29 DIAGNOSIS — E559 Vitamin D deficiency, unspecified: Secondary | ICD-10-CM | POA: Diagnosis not present

## 2018-07-29 DIAGNOSIS — Z6831 Body mass index (BMI) 31.0-31.9, adult: Secondary | ICD-10-CM | POA: Diagnosis not present

## 2018-07-30 NOTE — Progress Notes (Signed)
Office: [phone]  /  Fax: [phone] TeleHealth Visit:  Alferd Apa Kensinger has verbally consented to this TeleHealth visit today. The patient is located at home, the provider is located at the [facility] office. The participants in this visit include the listed provider and patient. The visit was conducted today via FaceTime.  HPI:   Chief Complaint: OBESITY [name] is here to discuss her progress with her obesity treatment plan. She is keeping a food journal with 1200-1300 calories and 80-85 grams of protein and is following her eating plan approximately 90% of the time. She states she is walking 20-40 minutes 4-5 times per week. Loralee reports her weight today is 156 lbs. She reports that she has not been consistently journaling due to family issues but is "keeping track in her head." We were unable to weigh the patient today for this TeleHealth visit. She feels as if she has lost 1 lb since her last visit. She has lost 12 lbs since starting treatment with [place].  Vitamin D deficiency [name] has a diagnosis of Vitamin D deficiency. She is currently taking prescription Vit D twice weekly and denies nausea, vomiting or muscle weakness.  Depression with emotional eating behaviors [name] is struggling with emotional eating and using food for comfort to the extent that it is negatively impacting her health. She often snacks when she is not hungry. [name] sometimes feels she is out of control and then feels guilty that she made poor food choices. She has been working on behavior modification techniques to help reduce her emotional eating and has been somewhat successful. [name] reports no cravings. She shows no sign of suicidal or homicidal ideations.  Depression screen [facility] 2/9 01/29/2018 11/08/2017 08/23/2016 08/22/2015 08/05/2014  Decreased Interest 3 0 0 1 0  Down, Depressed, Hopeless 2 0 0 1 0  PHQ - 2 Score 5 0 0 2 0  Altered sleeping 3 - - 0 -  Tired, decreased energy 3 - - 2 -  Change in  appetite 1 - - 0 -  Feeling bad or failure about yourself  1 - - 2 -  Trouble concentrating 1 - - 0 -  Moving slowly or fidgety/restless 3 - - 0 -  Suicidal thoughts 0 - - 0 -  PHQ-9 Score 17 - - 6 -  Difficult doing work/chores Somewhat difficult - - Somewhat difficult -  Some recent data might be hidden   ASSESSMENT AND PLAN:  Vitamin D deficiency  Other depression  Class 1 obesity with body mass index (BMI) of 31.0 to 31.9 in adult, unspecified obesity type, unspecified whether serious comorbidity present  PLAN:  Vitamin D Deficiency [name] was informed that low Vitamin D levels contributes to fatigue and are associated with obesity, breast, and colon cancer. She agrees to continue taking prescription Vit D twice weekly and will follow-up for routine testing of Vitamin D, at least 2-3 times per year. She was informed of the risk of over-replacement of Vitamin D and agrees to not increase her dose unless she discusses this with [place] first. Gaynelle agrees to follow-up with our clinic in 2 weeks.  Depression with Emotional Eating Behaviors We discussed behavior modification techniques today to help [name] deal with her emotional eating and depression. [name] was given a refill on her Wellbutrin #30 with 0 refills and agrees to follow-up with our clinic in 2 weeks.  Obesity [name] is currently in the action stage of change. As such, her goal is to continue  with weight loss efforts. She has agreed to keep a food journal with 1200-1300 calories and 80-85 grams of protein daily. Shirlyn has been instructed to work up to a goal of 150 minutes of combined cardio and strengthening exercise per week for weight loss and overall health benefits. We discussed the following Behavioral Modification Strategies today: work on meal planning, easy cooking plans, and keeping healthy foods in the home.  [name] has agreed to follow-up with our clinic in 2 weeks. She was informed of the importance of frequent follow-up  visits to maximize her success with intensive lifestyle modifications for her multiple health conditions.  ALLERGIES: Allergies  Allergen Reactions   Hydrocodone Nausea And Vomiting     nausea and vomiting and headaches   Amoxicillin Hives   Cephalexin      Neuropathy   Codeine     Crazy in the head, bp drops   Levofloxacin Itching   Lyrica [Pregabalin] Other (See Comments)    "Makes me like a zombie. I'm awake but I'm in slow motion."   Meperidine Hcl     B/P drops   Morphine And Related      States" extreme migraines"   Oxycodone-Acetaminophen Other (See Comments)    Crazy in head, drop in bp   Oxycodone-Acetaminophen Other (See Comments)   Sulfonamide Derivatives Nausea And Vomiting    Stomach upset   Valsartan-Hydrochlorothiazide Other (See Comments)    Headaches   Adhesive [Tape] Rash    Latex in adhesive tape. Please use paper tape   Erythromycin Diarrhea and Rash    Other reaction(s): GI Intolerance   Penicillins Nausea And Vomiting and Rash    MEDICATIONS: Current Outpatient Medications on File Prior to Visit  Medication Sig Dispense Refill   ALPRAZolam (XANAX) 0.25 MG tablet Take 1 tablet (0.25 mg total) by mouth daily as needed. For anxiety 30 tablet 3   aspirin 81 MG tablet Take 81 mg by mouth daily.     buPROPion (WELLBUTRIN SR) 200 MG 12 hr tablet Take 1 tablet (200 mg total) by mouth daily. 30 tablet 0   conjugated estrogens (PREMARIN) vaginal cream Place 1 Applicatorful vaginally. 3 times a week     DULoxetine (CYMBALTA) 60 MG capsule Take 1 capsule (60 mg total) by mouth daily. 90 capsule 0   fluticasone (FLONASE) 50 MCG/ACT nasal spray 2 sprays each nostril once or twice daily as neededs 16 g 12   hydrOXYzine (ATARAX/VISTARIL) 10 MG tablet Take 1-2 tablets (10-20 mg total) by mouth 3 (three) times daily as needed for itching. 30 tablet 1   levocetirizine (XYZAL) 5 MG tablet Take 5 mg by mouth daily.      methylphenidate (RITALIN) 10  MG tablet Take 1 tablet (10 mg total) by mouth as needed. 60 tablet 0   MYRBETRIQ 50 MG TB24 tablet daily.     NON FORMULARY CPAP- set on 12 [employer]. 6/24 pt is currently with [facility]     omeprazole (PRILOSEC) 20 MG capsule Take 20 mg by mouth daily.     rosuvastatin (CRESTOR) 20 MG tablet Take 1 tablet (20 mg total) by mouth daily. 90 tablet 1   traZODone (DESYREL) 100 MG tablet TAKE 1/2 TO 1 (ONE-HALF TO ONE) TABLET BY MOUTH ONCE DAILY AS NEEDED FOR SLEEP 90 tablet 2   valsartan (DIOVAN) 40 MG tablet Take 40 mg by mouth daily.     Vitamin D, Ergocalciferol, (DRISDOL) 1.25 MG (50000 UT) CAPS capsule Take 1 capsule (50,000 Units total) by  mouth every 3 (three) days. 10 capsule 0   No current facility-administered medications on file prior to visit.     PAST MEDICAL HISTORY: Past Medical History:  Diagnosis Date   Allergy    rhinitis   Anemia 02-01-12   iron absorption problem   Arthritis 02-01-12   osteoarthritis, Back pain, spine surgeries ? retain hardware cervial and  lumbar.   Asthma    Back pain    Cancer ([place]) 02-01-12   squamous cell -face   Depression with anxiety 01/08/2011   Diabetes ([place])    Dyspnea    Esophageal reflux    Fibromyalgia    Gallbladder problem    Generalized headaches    Heart murmur    HLD (hyperlipidemia)    HTN (hypertension)    Hyperparathyroidism ([place]) 08/18/2014   Joint pain    Knee pain, right 09/20/2011   Motion sickness 12/24/2011   Nausea & vomiting 12/24/2011   Nerve pain    Obesity    OSA (obstructive sleep apnea)    Osteoarthritis    Osteopenia 08/05/2014   Panic attacks    Pelvic floor dysfunction 09/02/2016   Sleep apnea, obstructive    Stress incontinence    Varicose veins of lower limb 10/02/2011   Vasovagal syncope 11/22/2017   Vitamin D deficiency     PAST SURGICAL HISTORY: Past Surgical History:  Procedure Laterality Date   ABDOMINAL SURGERY  02-01-12   ABDOMINAL SURGERY    APPENDECTOMY  1962   BARIATRIC SURGERY     BLADDER SUSPENSION     complicated by bowel nick/ clolostomy   BLEPHAROPLASTY Bilateral    CARPAL TUNNEL RELEASE  02-01-12   right   CATARACT EXTRACTION Bilateral    CERVICAL DISC SURGERY     C-7 in 2004, C-4 and C-5 in 2010   colostomy bag     Confirm with patient. Listed under medical conditions on form dated 07/18/09.   COLOSTOMY REVERSAL  02-01-12   EYE SURGERY     b/l cataracts and b/l blepharplasty   [place]   GASTRIC BYPASS  2003   Patient also noted "gastric bypass resection - 2004"   HERNIA REPAIR  2009   Two hernias and abdominal reconstruction   ivc filter     prophyllactically- no hx DVT   LUMBAR DISC SURGERY     L1-L3 all had surgery most recently in 2017   [place]     L3-L4, Dr. Trenton Gammon   pannilectomy     Per medical history form dated 07/18/09.   TOTAL KNEE ARTHROPLASTY  02/11/2012   Procedure: TOTAL KNEE ARTHROPLASTY;  Surgeon: Mauri Pole, MD;  Location: [facility];  Service: Orthopedics;  Laterality: Right;   ULNAR NERVE REPAIR Left 07/18/2016   by Dr.Henry Pool   VULVA SURGERY  02-01-12   cyst removal-pt 8 months pregnant    SOCIAL HISTORY: Social History   Tobacco Use   Smoking status: Never Smoker   Smokeless tobacco: Never Used  Substance Use Topics   Alcohol use: No    Alcohol/week: 0.0 standard drinks    Comment: special occasions   Drug use: No    FAMILY HISTORY: Family History  Problem Relation Age of Onset   Alcohol abuse Mother    Other Mother        accidental med overdose   Emphysema Mother        smoked   Bipolar disorder Mother    Sudden death Mother  Liver disease Mother    Other Father        CHF   Diabetes Father    Cancer Father        throat ca/ smoked   Alcohol abuse Father    Stroke Father    Hypertension Father    Liver disease Father    Hypertension Brother    Hyperlipidemia Brother    Obesity Brother     Heart attack Maternal Grandfather    Heart attack Paternal Grandmother    Heart attack Paternal Grandfather    Arthritis Son        psoriatic   Arthritis Son        psoriatic   Obesity Son    ROS: Review of Systems  Gastrointestinal: Negative for nausea and vomiting.  Musculoskeletal:       Negative for muscle weakness.  Psychiatric/Behavioral: Positive for depression (emotional eating). Negative for suicidal ideas.       Negative for homicidal ideas.   PHYSICAL EXAM: Pt in no acute distress  RECENT LABS AND TESTS: BMET    Component Value Date/Time   NA 144 01/29/2018 1001   K 3.9 01/29/2018 1001   CL 105 01/29/2018 1001   CO2 23 01/29/2018 1001   GLUCOSE 83 01/29/2018 1001   GLUCOSE 105 (H) 07/10/2017 1835   BUN 16 01/29/2018 1001   CREATININE 0.67 01/29/2018 1001   CREATININE 0.83 11/28/2012 1153   CALCIUM 9.0 01/29/2018 1001   GFRNONAA 89 01/29/2018 1001   GFRAA 103 01/29/2018 1001   Lab Results  Component Value Date   HGBA1C 5.7 (H) 01/29/2018   HGBA1C 5.9 07/03/2017   HGBA1C 5.9 09/06/2016   HGBA1C 5.8 03/14/2016   HGBA1C 5.6 08/22/2015   Lab Results  Component Value Date   INSULIN 4.3 01/29/2018   CBC    Component Value Date/Time   WBC 7.0 01/29/2018 1001   WBC 13.0 (H) 07/10/2017 1835   RBC 3.84 01/29/2018 1001   RBC 3.88 07/10/2017 1835   HGB 10.8 (L) 01/29/2018 1001   HGB 12.4 02/19/2011 1425   HCT 35.0 01/29/2018 1001   HCT 37.1 02/19/2011 1425   PLT 203 07/10/2017 1835   PLT 220 02/19/2011 1425   MCV 91 01/29/2018 1001   MCV 93.7 02/19/2011 1425   MCH 28.1 01/29/2018 1001   MCH 28.6 07/10/2017 1835   MCHC 30.9 (L) 01/29/2018 1001   MCHC 32.2 07/10/2017 1835   RDW 15.0 01/29/2018 1001   RDW 13.7 02/19/2011 1425   LYMPHSABS 1.8 01/29/2018 1001   LYMPHSABS 1.1 02/19/2011 1425   MONOABS 1.1 (H) 07/10/2017 1835   MONOABS 0.5 02/19/2011 1425   EOSABS 0.5 (H) 01/29/2018 1001   BASOSABS 0.1 01/29/2018 1001   BASOSABS 0.0  02/19/2011 1425   Iron/TIBC/Ferritin/ %Sat    Component Value Date/Time   IRON 66 02/04/2015 0738   TIBC 292 02/19/2011 1425   FERRITIN 15.5 08/05/2014 0945   IRONPCTSAT 17.3 (L) 02/04/2015 0738   IRONPCTSAT 17 (L) 02/19/2011 1425   Lipid Panel     Component Value Date/Time   CHOL 114 01/29/2018 1001   TRIG 78 01/29/2018 1001   HDL 58 01/29/2018 1001   CHOLHDL 2 07/03/2017 1550   VLDL 13.2 07/03/2017 1550   LDLCALC 40 01/29/2018 1001   Hepatic Function Panel     Component Value Date/Time   PROT 6.3 01/29/2018 1001   ALBUMIN 4.2 01/29/2018 1001   AST 29 01/29/2018 1001   ALT 22 01/29/2018 1001  ALKPHOS 103 01/29/2018 1001   BILITOT 0.4 01/29/2018 1001   BILIDIR 0.1 06/01/2013 0745   IBILI 0.6 02/02/2013 0850      Component Value Date/Time   TSH 1.900 01/29/2018 1001   TSH 2.99 09/06/2016 0751   TSH 3.21 03/14/2016 0757   Results for ALIENE, TAMURA (MRN [id number]) as of 07/30/2018 09:32  Ref. Range 01/29/2018 10:01  Vitamin D, 25-Hydroxy Latest Ref Range: 30.0 - 100.0 ng/mL 23.7 (L)   I, Michaelene Song, am acting as Location manager for [employer], PA-C I, Abby Potash, PA-C have reviewed above note and agree with its content

## 2018-08-12 ENCOUNTER — Encounter (INDEPENDENT_AMBULATORY_CARE_PROVIDER_SITE_OTHER): Payer: Self-pay | Admitting: Bariatrics

## 2018-08-12 ENCOUNTER — Other Ambulatory Visit: Payer: Self-pay

## 2018-08-12 ENCOUNTER — Telehealth (INDEPENDENT_AMBULATORY_CARE_PROVIDER_SITE_OTHER): Payer: Medicare Other | Admitting: Bariatrics

## 2018-08-12 DIAGNOSIS — E669 Obesity, unspecified: Secondary | ICD-10-CM | POA: Diagnosis not present

## 2018-08-12 DIAGNOSIS — E559 Vitamin D deficiency, unspecified: Secondary | ICD-10-CM | POA: Diagnosis not present

## 2018-08-12 DIAGNOSIS — R7303 Prediabetes: Secondary | ICD-10-CM

## 2018-08-12 DIAGNOSIS — Z6834 Body mass index (BMI) 34.0-34.9, adult: Secondary | ICD-10-CM

## 2018-08-12 MED ORDER — VITAMIN D (ERGOCALCIFEROL) 1.25 MG (50000 UNIT) PO CAPS: 50000.0000 [IU] | ORAL_CAPSULE | ORAL | 0 refills | Status: DC

## 2018-08-13 NOTE — Progress Notes (Signed)
Office: [phone]  /  Fax: [phone] TeleHealth Visit:  [name] has verbally consented to this TeleHealth visit today. The patient is located at home, the provider is located at the [facility] office. The participants in this visit include the listed provider and patient. The visit was conducted today via face time.  HPI:   Chief Complaint: OBESITY [name] is here to discuss her progress with her obesity treatment plan. She is on the keep a food journal with 1200-1300 calories and 80-85 grams of protein daily and is following her eating plan approximately 90 % of the time. She states she is walking for 20-40 minutes 3 times per week. [name] states that she has lost 2 lbs (weight of 154 lbs). She is getting adequate water. She is getting adequate protein.  We were unable to weigh the patient today for this TeleHealth visit. She feels as if she has lost 2 lbs since her last visit. She has lost 12 lbs since starting treatment with [place].  Vitamin D Deficiency [name] has a diagnosis of vitamin D deficiency. She is currently taking prescription Vit D. Last Vit D level was of 23.7. She denies nausea, vomiting or muscle weakness.  Pre-Diabetes [name] has a diagnosis of pre-diabetes based on her elevated Hgb A1c and was informed this puts her at greater risk of developing diabetes. Last A1c was 5.7 and insulin of 4.3. She is not on any medications and continues to work on diet and exercise to decrease risk of diabetes.   ASSESSMENT AND PLAN:  Vitamin D deficiency  Prediabetes  Class 1 obesity with serious comorbidity and body mass index (BMI) of 34.0 to 34.9 in adult, unspecified obesity type  PLAN:  Vitamin D Deficiency [name] was informed that low vitamin D levels contributes to fatigue and are associated with obesity, breast, and colon cancer. [name] agrees to continue taking prescription Vit D 50,000 IU every week #4 and we will refill for 1 month. She will follow up for  routine testing of vitamin D, at least 2-3 times per year. She was informed of the risk of over-replacement of vitamin D and agrees to not increase her dose unless she discusses this with [place] first. [name] agrees to follow up with our clinic in 2 weeks.  Pre-Diabetes [name] will continue activities, and continue to work on weight loss, increase protein, and decreasing simple carbohydrates in her diet to help decrease the risk of diabetes. We dicussed metformin including benefits and risks. She was informed that eating too many simple carbohydrates or too many calories at one sitting increases the likelihood of GI side effects. [name] declined metformin for now and a prescription was not written today. [name] agrees to follow up with our clinic in 2 weeks as directed to monitor her progress.  Obesity [name] is currently in the action stage of change. As such, her goal is to continue with weight loss efforts She has agreed to keep a food journal with 1200-1300 calories and 80-85 grams of protein daily [name] has been instructed to work up to a goal of 150 minutes of combined cardio and strengthening exercise per week or continue doing cardio, and she will begin resistance and light weights to increase metabolism, weight loss, and overall health benefits. We discussed the following Behavioral Modification Strategies today: increasing lean protein intake, decreasing simple carbohydrates, increasing vegetables, decrease eating out, increase H20 intake, work on meal planning and easy cooking plans, no skipping meals, keeping healthy foods in  the home, celebration eating strategies, and planning for success   [name] has agreed to follow up with our clinic in 2 weeks. She was informed of the importance of frequent follow up visits to maximize her success with intensive lifestyle modifications for her multiple health conditions.  ALLERGIES: Allergies  Allergen Reactions  . Hydrocodone Nausea And Vomiting     nausea and  vomiting and headaches  . Amoxicillin Hives  . Cephalexin      Neuropathy  . Codeine     Crazy in the head, bp drops  . Levofloxacin Itching  . Lyrica [Pregabalin] Other (See Comments)    "Makes me like a zombie. I'm awake but I'm in slow motion."  . Meperidine Hcl     B/P drops  . Morphine And Related      States" extreme migraines"  . Oxycodone-Acetaminophen Other (See Comments)    Crazy in head, drop in bp  . Oxycodone-Acetaminophen Other (See Comments)  . Sulfonamide Derivatives Nausea And Vomiting    Stomach upset  . Valsartan-Hydrochlorothiazide Other (See Comments)    Headaches  . Adhesive [Tape] Rash    Latex in adhesive tape. Please use paper tape  . Erythromycin Diarrhea and Rash    Other reaction(s): GI Intolerance  . Penicillins Nausea And Vomiting and Rash    MEDICATIONS: Current Outpatient Medications on File Prior to Visit  Medication Sig Dispense Refill  . ALPRAZolam (XANAX) 0.25 MG tablet Take 1 tablet (0.25 mg total) by mouth daily as needed. For anxiety 30 tablet 3  . aspirin 81 MG tablet Take 81 mg by mouth daily.    [name] buPROPion (WELLBUTRIN SR) 200 MG 12 hr tablet Take 1 tablet (200 mg total) by mouth daily. 30 tablet 0  . conjugated estrogens (PREMARIN) vaginal cream Place 1 Applicatorful vaginally. 3 times a week    . DULoxetine (CYMBALTA) 60 MG capsule Take 1 capsule (60 mg total) by mouth daily. 90 capsule 0  . fluticasone (FLONASE) 50 MCG/ACT nasal spray 2 sprays each nostril once or twice daily as neededs 16 g 12  . hydrOXYzine (ATARAX/VISTARIL) 10 MG tablet Take 1-2 tablets (10-20 mg total) by mouth 3 (three) times daily as needed for itching. 30 tablet 1  . levocetirizine (XYZAL) 5 MG tablet Take 5 mg by mouth daily.     . methylphenidate (RITALIN) 10 MG tablet Take 1 tablet (10 mg total) by mouth as needed. 60 tablet 0  . MYRBETRIQ 50 MG TB24 tablet daily.    . NON FORMULARY CPAP- set on 12 [employer]. 6/24 pt is currently with [facility]    . omeprazole  (PRILOSEC) 20 MG capsule Take 20 mg by mouth daily.    . rosuvastatin (CRESTOR) 20 MG tablet Take 1 tablet (20 mg total) by mouth daily. 90 tablet 1  . traZODone (DESYREL) 100 MG tablet TAKE 1/2 TO 1 (ONE-HALF TO ONE) TABLET BY MOUTH ONCE DAILY AS NEEDED FOR SLEEP 90 tablet 2  . valsartan (DIOVAN) 40 MG tablet Take 40 mg by mouth daily.     No current facility-administered medications on file prior to visit.     PAST MEDICAL HISTORY: Past Medical History:  Diagnosis Date  . Allergy    rhinitis  . Anemia 02-01-12   iron absorption problem  . Arthritis 02-01-12   osteoarthritis, Back pain, spine surgeries ? retain hardware cervial and  lumbar.  . Asthma   . Back pain   . Cancer ([place]) 02-01-12   squamous cell -face  .  Depression with anxiety 01/08/2011  . Diabetes ([place])   . Dyspnea   . Esophageal reflux   . Fibromyalgia   . Gallbladder problem   . Generalized headaches   . Heart murmur   . HLD (hyperlipidemia)   . HTN (hypertension)   . Hyperparathyroidism ([place]) 08/18/2014  . Joint pain   . Knee pain, right 09/20/2011  . Motion sickness 12/24/2011  . Nausea & vomiting 12/24/2011  . Nerve pain   . Obesity   . OSA (obstructive sleep apnea)   . Osteoarthritis   . Osteopenia 08/05/2014  . Panic attacks   . Pelvic floor dysfunction 09/02/2016  . Sleep apnea, obstructive   . Stress incontinence   . Varicose veins of lower limb 10/02/2011  . Vasovagal syncope 11/22/2017  . Vitamin D deficiency     PAST SURGICAL HISTORY: Past Surgical History:  Procedure Laterality Date  . ABDOMINAL SURGERY  02-01-12   ABDOMINAL SURGERY  . APPENDECTOMY  1962  . BARIATRIC SURGERY    . BLADDER SUSPENSION     complicated by bowel nick/ clolostomy  . BLEPHAROPLASTY Bilateral   . CARPAL TUNNEL RELEASE  02-01-12   right  . CATARACT EXTRACTION Bilateral   . CERVICAL DISC SURGERY     C-7 in 2004, C-4 and C-5 in 2010  . colostomy bag     Confirm with patient. Listed under medical conditions on form  dated 07/18/09.  [name] COLOSTOMY REVERSAL  02-01-12  . EYE SURGERY     b/l cataracts and b/l blepharplasty  . [place]  . GASTRIC BYPASS  2003   Patient also noted "gastric bypass resection - 2004"  . HERNIA REPAIR  2009   Two hernias and abdominal reconstruction  . ivc filter     prophyllactically- no hx DVT  . LUMBAR DISC SURGERY     L1-L3 all had surgery most recently in 2017  . LUMBAR DISC SURGERY     L3-L4, Dr. Trenton Gammon  . pannilectomy     Per medical history form dated 07/18/09.  [name] TOTAL KNEE ARTHROPLASTY  02/11/2012   Procedure: TOTAL KNEE ARTHROPLASTY;  Surgeon: Mauri Pole, MD;  Location: [facility];  Service: Orthopedics;  Laterality: Right;  . ULNAR NERVE REPAIR Left 07/18/2016   by Dr.Henry Pool  . VULVA SURGERY  02-01-12   cyst removal-pt 8 months pregnant    SOCIAL HISTORY: Social History   Tobacco Use  . Smoking status: Never Smoker  . Smokeless tobacco: Never Used  Substance Use Topics  . Alcohol use: No    Alcohol/week: 0.0 standard drinks    Comment: special occasions  . Drug use: No    FAMILY HISTORY: Family History  Problem Relation Age of Onset  . Alcohol abuse Mother   . Other Mother        accidental med overdose  . Emphysema Mother        smoked  . Bipolar disorder Mother   . Sudden death Mother   . Liver disease Mother   . Other Father        CHF  . Diabetes Father   . Cancer Father        throat ca/ smoked  . Alcohol abuse Father   . Stroke Father   . Hypertension Father   . Liver disease Father   . Hypertension Brother   . Hyperlipidemia Brother   . Obesity Brother   . Heart attack Maternal Grandfather   . Heart attack Paternal Grandmother   .  Heart attack Paternal Grandfather   . Arthritis Son        psoriatic  . Arthritis Son        psoriatic  . Obesity Son     ROS: Review of Systems  Constitutional: Positive for weight loss.  Gastrointestinal: Negative for nausea and vomiting.  Musculoskeletal:        Negative muscle weakness    PHYSICAL EXAM: Pt in no acute distress  RECENT LABS AND TESTS: BMET    Component Value Date/Time   NA 144 01/29/2018 1001   K 3.9 01/29/2018 1001   CL 105 01/29/2018 1001   CO2 23 01/29/2018 1001   GLUCOSE 83 01/29/2018 1001   GLUCOSE 105 (H) 07/10/2017 1835   BUN 16 01/29/2018 1001   CREATININE 0.67 01/29/2018 1001   CREATININE 0.83 11/28/2012 1153   CALCIUM 9.0 01/29/2018 1001   GFRNONAA 89 01/29/2018 1001   GFRAA 103 01/29/2018 1001   Lab Results  Component Value Date   HGBA1C 5.7 (H) 01/29/2018   HGBA1C 5.9 07/03/2017   HGBA1C 5.9 09/06/2016   HGBA1C 5.8 03/14/2016   HGBA1C 5.6 08/22/2015   Lab Results  Component Value Date   INSULIN 4.3 01/29/2018   CBC    Component Value Date/Time   WBC 7.0 01/29/2018 1001   WBC 13.0 (H) 07/10/2017 1835   RBC 3.84 01/29/2018 1001   RBC 3.88 07/10/2017 1835   HGB 10.8 (L) 01/29/2018 1001   HGB 12.4 02/19/2011 1425   HCT 35.0 01/29/2018 1001   HCT 37.1 02/19/2011 1425   PLT 203 07/10/2017 1835   PLT 220 02/19/2011 1425   MCV 91 01/29/2018 1001   MCV 93.7 02/19/2011 1425   MCH 28.1 01/29/2018 1001   MCH 28.6 07/10/2017 1835   MCHC 30.9 (L) 01/29/2018 1001   MCHC 32.2 07/10/2017 1835   RDW 15.0 01/29/2018 1001   RDW 13.7 02/19/2011 1425   LYMPHSABS 1.8 01/29/2018 1001   LYMPHSABS 1.1 02/19/2011 1425   MONOABS 1.1 (H) 07/10/2017 1835   MONOABS 0.5 02/19/2011 1425   EOSABS 0.5 (H) 01/29/2018 1001   BASOSABS 0.1 01/29/2018 1001   BASOSABS 0.0 02/19/2011 1425   Iron/TIBC/Ferritin/ %Sat    Component Value Date/Time   IRON 66 02/04/2015 0738   TIBC 292 02/19/2011 1425   FERRITIN 15.5 08/05/2014 0945   IRONPCTSAT 17.3 (L) 02/04/2015 0738   IRONPCTSAT 17 (L) 02/19/2011 1425   Lipid Panel     Component Value Date/Time   CHOL 114 01/29/2018 1001   TRIG 78 01/29/2018 1001   HDL 58 01/29/2018 1001   CHOLHDL 2 07/03/2017 1550   VLDL 13.2 07/03/2017 1550   LDLCALC 40 01/29/2018 1001    Hepatic Function Panel     Component Value Date/Time   PROT 6.3 01/29/2018 1001   ALBUMIN 4.2 01/29/2018 1001   AST 29 01/29/2018 1001   ALT 22 01/29/2018 1001   ALKPHOS 103 01/29/2018 1001   BILITOT 0.4 01/29/2018 1001   BILIDIR 0.1 06/01/2013 0745   IBILI 0.6 02/02/2013 0850      Component Value Date/Time   TSH 1.900 01/29/2018 1001   TSH 2.99 09/06/2016 0751   TSH 3.21 03/14/2016 0757      I, Trixie Dredge, am acting as transcriptionist for Jearld Lesch, DO  I have reviewed the above documentation for accuracy and completeness, and I agree with the above. Jearld Lesch, DO

## 2018-08-27 ENCOUNTER — Other Ambulatory Visit: Payer: Self-pay | Admitting: Cardiology

## 2018-08-28 ENCOUNTER — Other Ambulatory Visit (INDEPENDENT_AMBULATORY_CARE_PROVIDER_SITE_OTHER): Payer: Self-pay | Admitting: Family Medicine

## 2018-08-28 DIAGNOSIS — F3289 Other specified depressive episodes: Secondary | ICD-10-CM

## 2018-08-30 ENCOUNTER — Other Ambulatory Visit (INDEPENDENT_AMBULATORY_CARE_PROVIDER_SITE_OTHER): Payer: Self-pay | Admitting: Family Medicine

## 2018-08-30 DIAGNOSIS — F3289 Other specified depressive episodes: Secondary | ICD-10-CM

## 2018-09-02 ENCOUNTER — Encounter (INDEPENDENT_AMBULATORY_CARE_PROVIDER_SITE_OTHER): Payer: Self-pay | Admitting: Family Medicine

## 2018-09-02 ENCOUNTER — Other Ambulatory Visit (INDEPENDENT_AMBULATORY_CARE_PROVIDER_SITE_OTHER): Payer: Self-pay | Admitting: Family Medicine

## 2018-09-02 DIAGNOSIS — F3289 Other specified depressive episodes: Secondary | ICD-10-CM

## 2018-09-09 ENCOUNTER — Telehealth (INDEPENDENT_AMBULATORY_CARE_PROVIDER_SITE_OTHER): Payer: Medicare Other | Admitting: Family Medicine

## 2018-09-09 ENCOUNTER — Other Ambulatory Visit: Payer: Self-pay

## 2018-09-09 DIAGNOSIS — E669 Obesity, unspecified: Secondary | ICD-10-CM

## 2018-09-09 DIAGNOSIS — Z6831 Body mass index (BMI) 31.0-31.9, adult: Secondary | ICD-10-CM | POA: Diagnosis not present

## 2018-09-09 DIAGNOSIS — F3289 Other specified depressive episodes: Secondary | ICD-10-CM

## 2018-09-09 DIAGNOSIS — E559 Vitamin D deficiency, unspecified: Secondary | ICD-10-CM | POA: Diagnosis not present

## 2018-09-09 MED ORDER — BUPROPION HCL ER (SR) 200 MG PO TB12: 200.0000 mg | ORAL_TABLET | Freq: Every day | ORAL | 0 refills | Status: DC

## 2018-09-10 NOTE — Progress Notes (Signed)
Office: [phone]  /  Fax: [phone] TeleHealth Visit:  Alferd Apa Hosack has verbally consented to this TeleHealth visit today. The patient is located at home, the provider is located at the [facility] office. The participants in this visit include the listed provider and patient. The visit was conducted today via telephone call (FaceTime failed - changed to telephone call).  HPI:   Chief Complaint: OBESITY Kathyleen is here to discuss her progress with her obesity treatment plan. She is keeping a food journal with 1200-1300 calories and 85 grams of protein and is following her eating plan approximately 60% of the time. She states she is walking 20-30 minutes 3-4 times per week. Lamyra weighs 153 today and has lost 1 lb. She states he has joined a gym.  She is feeling depressed and has not been able to stick to the plan well. She skips meals at times and eats too much at others. We were unable to weigh the patient today for this TeleHealth visit. She feels as if she has lost 1 lb since her last visit. She has lost 12 lbs since starting treatment with [place].  Depression with emotional eating behaviors Courtland is struggling with emotional eating and using food for comfort to the extent that it is negatively impacting her health. She often snacks when she is not hungry. Adrienna sometimes feels she is out of control and then feels guilty that she made poor food choices. She has been working on behavior modification techniques to help reduce her emotional eating and has been somewhat successful. Makaylyn lives with her son and is feeling very depressed due to the loss of her son's job which has caused financial uncertainty. Her son and his wife also have a newborn baby as well as other children. She has not been on bupropion because she ran out and she feels this is contributing to her mood instability. She also takes Cymbalta.  She shows no sign of suicidal or homicidal ideations.  Depression screen  [facility] 2/9 01/29/2018 11/08/2017 08/23/2016 08/22/2015 08/05/2014  Decreased Interest 3 0 0 1 0  Down, Depressed, Hopeless 2 0 0 1 0  PHQ - 2 Score 5 0 0 2 0  Altered sleeping 3 - - 0 -  Tired, decreased energy 3 - - 2 -  Change in appetite 1 - - 0 -  Feeling bad or failure about yourself  1 - - 2 -  Trouble concentrating 1 - - 0 -  Moving slowly or fidgety/restless 3 - - 0 -  Suicidal thoughts 0 - - 0 -  PHQ-9 Score 17 - - 6 -  Difficult doing work/chores Somewhat difficult - - Somewhat difficult -  Some recent data might be hidden   Vitamin D deficiency Kambria has a diagnosis of Vitamin D deficiency, which is not at goal. Last Vitamin D was 23.7 on 01/29/2018. She is currently taking prescription Vit D and denies nausea, vomiting or muscle weakness.  ASSESSMENT AND PLAN:  Other depression - with emotional eating - Plan: buPROPion (WELLBUTRIN SR) 200 MG 12 hr tablet  PLAN:  Depression with Emotional Eating Behaviors We discussed behavior modification techniques today to help Marcelle deal with her emotional eating and depression. Tesla was given a refill on her bupropion 200 mg QAM #30 with 0 refills and agrees to follow-up with our clinic in 2-3 weeks. Will consider referral to Dr. Mallie Mussel at her next visit. I encouraged her to find a PCP in her area  to manage her depression. . She recently moved to [facility], [place] and has not established with a PCP.  Vitamin D Deficiency Sunshine was informed that low Vitamin D levels contributes to fatigue and are associated with obesity, breast, and colon cancer. She will follow-up for routine testing of Vitamin D at her next visit with our clinic in 2-3 weeks.  Obesity Pattye is currently in the action stage of change. As such, her goal is to continue with weight loss efforts. She has agreed to keep a food journal with 1200-1300 calories and 85 grams of protein. Will repeat IC at her next visit.  Kathren has been instructed to continue her current exercise regimen  for weight loss and overall health benefits. We discussed the following Behavioral Modification Strategies today: increasing lean protein intake, no skipping meals, planning for success, and keep a strict food journal.  Anet has agreed to follow-up with our clinic in 2-3 weeks. She was informed of the importance of frequent follow-up visits to maximize her success with intensive lifestyle modifications for her multiple health conditions.  ALLERGIES: Allergies  Allergen Reactions  . Hydrocodone Nausea And Vomiting     nausea and vomiting and headaches  . Amoxicillin Hives  . Cephalexin      Neuropathy  . Codeine     Crazy in the head, bp drops  . Levofloxacin Itching  . Lyrica [Pregabalin] Other (See Comments)    "Makes me like a zombie. I'm awake but I'm in slow motion."  . Meperidine Hcl     B/P drops  . Morphine And Related      States" extreme migraines"  . Oxycodone-Acetaminophen Other (See Comments)    Crazy in head, drop in bp  . Oxycodone-Acetaminophen Other (See Comments)  . Sulfonamide Derivatives Nausea And Vomiting    Stomach upset  . Valsartan-Hydrochlorothiazide Other (See Comments)    Headaches  . Adhesive [Tape] Rash    Latex in adhesive tape. Please use paper tape  . Erythromycin Diarrhea and Rash    Other reaction(s): GI Intolerance  . Penicillins Nausea And Vomiting and Rash    MEDICATIONS: Current Outpatient Medications on File Prior to Visit  Medication Sig Dispense Refill  . ALPRAZolam (XANAX) 0.25 MG tablet Take 1 tablet (0.25 mg total) by mouth daily as needed. For anxiety 30 tablet 3  . aspirin 81 MG tablet Take 81 mg by mouth daily.    [name] conjugated estrogens (PREMARIN) vaginal cream Place 1 Applicatorful vaginally. 3 times a week    . DULoxetine (CYMBALTA) 60 MG capsule Take 1 capsule (60 mg total) by mouth daily. 90 capsule 0  . fluticasone (FLONASE) 50 MCG/ACT nasal spray 2 sprays each nostril once or twice daily as neededs 16 g 12  . hydrOXYzine  (ATARAX/VISTARIL) 10 MG tablet Take 1-2 tablets (10-20 mg total) by mouth 3 (three) times daily as needed for itching. 30 tablet 1  . levocetirizine (XYZAL) 5 MG tablet Take 5 mg by mouth daily.     . methylphenidate (RITALIN) 10 MG tablet Take 1 tablet (10 mg total) by mouth as needed. 60 tablet 0  . MYRBETRIQ 50 MG TB24 tablet daily.    . NON FORMULARY CPAP- set on 12 [employer]. 6/24 pt is currently with [facility]    . omeprazole (PRILOSEC) 20 MG capsule Take 20 mg by mouth daily.    . rosuvastatin (CRESTOR) 20 MG tablet Take 1 tablet (20 mg total) by mouth daily. 90 tablet 1  . traZODone (DESYREL)  100 MG tablet TAKE 1/2 TO 1 (ONE-HALF TO ONE) TABLET BY MOUTH ONCE DAILY AS NEEDED FOR SLEEP 90 tablet 2  . valsartan (DIOVAN) 40 MG tablet Take 40 mg by mouth daily.    . valsartan-hydrochlorothiazide (DIOVAN-HCT) 80-12.5 MG tablet TAKE 1 TABLET BY MOUTH ONCE DAILY IN THE MORNING 30 tablet 0  . Vitamin D, Ergocalciferol, (DRISDOL) 1.25 MG (50000 UT) CAPS capsule Take 1 capsule (50,000 Units total) by mouth every 7 (seven) days. 4 capsule 0   No current facility-administered medications on file prior to visit.     PAST MEDICAL HISTORY: Past Medical History:  Diagnosis Date  . Allergy    rhinitis  . Anemia 02-01-12   iron absorption problem  . Arthritis 02-01-12   osteoarthritis, Back pain, spine surgeries ? retain hardware cervial and  lumbar.  . Asthma   . Back pain   . Cancer ([place]) 02-01-12   squamous cell -face  . Depression with anxiety 01/08/2011  . Diabetes ([place])   . Dyspnea   . Esophageal reflux   . Fibromyalgia   . Gallbladder problem   . Generalized headaches   . Heart murmur   . HLD (hyperlipidemia)   . HTN (hypertension)   . Hyperparathyroidism ([place]) 08/18/2014  . Joint pain   . Knee pain, right 09/20/2011  . Motion sickness 12/24/2011  . Nausea & vomiting 12/24/2011  . Nerve pain   . Obesity   . OSA (obstructive sleep apnea)   . Osteoarthritis   . Osteopenia 08/05/2014  .  Panic attacks   . Pelvic floor dysfunction 09/02/2016  . Sleep apnea, obstructive   . Stress incontinence   . Varicose veins of lower limb 10/02/2011  . Vasovagal syncope 11/22/2017  . Vitamin D deficiency     PAST SURGICAL HISTORY: Past Surgical History:  Procedure Laterality Date  . ABDOMINAL SURGERY  02-01-12   ABDOMINAL SURGERY  . APPENDECTOMY  1962  . BARIATRIC SURGERY    . BLADDER SUSPENSION     complicated by bowel nick/ clolostomy  . BLEPHAROPLASTY Bilateral   . CARPAL TUNNEL RELEASE  02-01-12   right  . CATARACT EXTRACTION Bilateral   . CERVICAL DISC SURGERY     C-7 in 2004, C-4 and C-5 in 2010  . colostomy bag     Confirm with patient. Listed under medical conditions on form dated 07/18/09.  [name] COLOSTOMY REVERSAL  02-01-12  . EYE SURGERY     b/l cataracts and b/l blepharplasty  . [place]  . GASTRIC BYPASS  2003   Patient also noted "gastric bypass resection - 2004"  . HERNIA REPAIR  2009   Two hernias and abdominal reconstruction  . ivc filter     prophyllactically- no hx DVT  . LUMBAR DISC SURGERY     L1-L3 all had surgery most recently in 2017  . LUMBAR DISC SURGERY     L3-L4, Dr. Trenton Gammon  . pannilectomy     Per medical history form dated 07/18/09.  [name] TOTAL KNEE ARTHROPLASTY  02/11/2012   Procedure: TOTAL KNEE ARTHROPLASTY;  Surgeon: Mauri Pole, MD;  Location: [facility];  Service: Orthopedics;  Laterality: Right;  . ULNAR NERVE REPAIR Left 07/18/2016   by Dr.Henry Pool  . VULVA SURGERY  02-01-12   cyst removal-pt 8 months pregnant    SOCIAL HISTORY: Social History   Tobacco Use  . Smoking status: Never Smoker  . Smokeless tobacco: Never Used  Substance Use Topics  . Alcohol use:  No    Alcohol/week: 0.0 standard drinks    Comment: special occasions  . Drug use: No    FAMILY HISTORY: Family History  Problem Relation Age of Onset  . Alcohol abuse Mother   . Other Mother        accidental med overdose  . Emphysema Mother         smoked  . Bipolar disorder Mother   . Sudden death Mother   . Liver disease Mother   . Other Father        CHF  . Diabetes Father   . Cancer Father        throat ca/ smoked  . Alcohol abuse Father   . Stroke Father   . Hypertension Father   . Liver disease Father   . Hypertension Brother   . Hyperlipidemia Brother   . Obesity Brother   . Heart attack Maternal Grandfather   . Heart attack Paternal Grandmother   . Heart attack Paternal Grandfather   . Arthritis Son        psoriatic  . Arthritis Son        psoriatic  . Obesity Son    ROS: Review of Systems  Gastrointestinal: Negative for nausea and vomiting.  Musculoskeletal:       Negative for muscle weakness.  Psychiatric/Behavioral: Positive for depression (emotional eating). Negative for suicidal ideas.       Negative for homicidal ideas.   PHYSICAL EXAM: Pt in no acute distress  RECENT LABS AND TESTS: BMET    Component Value Date/Time   NA 144 01/29/2018 1001   K 3.9 01/29/2018 1001   CL 105 01/29/2018 1001   CO2 23 01/29/2018 1001   GLUCOSE 83 01/29/2018 1001   GLUCOSE 105 (H) 07/10/2017 1835   BUN 16 01/29/2018 1001   CREATININE 0.67 01/29/2018 1001   CREATININE 0.83 11/28/2012 1153   CALCIUM 9.0 01/29/2018 1001   GFRNONAA 89 01/29/2018 1001   GFRAA 103 01/29/2018 1001   Lab Results  Component Value Date   HGBA1C 5.7 (H) 01/29/2018   HGBA1C 5.9 07/03/2017   HGBA1C 5.9 09/06/2016   HGBA1C 5.8 03/14/2016   HGBA1C 5.6 08/22/2015   Lab Results  Component Value Date   INSULIN 4.3 01/29/2018   CBC    Component Value Date/Time   WBC 7.0 01/29/2018 1001   WBC 13.0 (H) 07/10/2017 1835   RBC 3.84 01/29/2018 1001   RBC 3.88 07/10/2017 1835   HGB 10.8 (L) 01/29/2018 1001   HGB 12.4 02/19/2011 1425   HCT 35.0 01/29/2018 1001   HCT 37.1 02/19/2011 1425   PLT 203 07/10/2017 1835   PLT 220 02/19/2011 1425   MCV 91 01/29/2018 1001   MCV 93.7 02/19/2011 1425   MCH 28.1 01/29/2018 1001   MCH 28.6  07/10/2017 1835   MCHC 30.9 (L) 01/29/2018 1001   MCHC 32.2 07/10/2017 1835   RDW 15.0 01/29/2018 1001   RDW 13.7 02/19/2011 1425   LYMPHSABS 1.8 01/29/2018 1001   LYMPHSABS 1.1 02/19/2011 1425   MONOABS 1.1 (H) 07/10/2017 1835   MONOABS 0.5 02/19/2011 1425   EOSABS 0.5 (H) 01/29/2018 1001   BASOSABS 0.1 01/29/2018 1001   BASOSABS 0.0 02/19/2011 1425   Iron/TIBC/Ferritin/ %Sat    Component Value Date/Time   IRON 66 02/04/2015 0738   TIBC 292 02/19/2011 1425   FERRITIN 15.5 08/05/2014 0945   IRONPCTSAT 17.3 (L) 02/04/2015 0738   IRONPCTSAT 17 (L) 02/19/2011 1425   Lipid Panel  Component Value Date/Time   CHOL 114 01/29/2018 1001   TRIG 78 01/29/2018 1001   HDL 58 01/29/2018 1001   CHOLHDL 2 07/03/2017 1550   VLDL 13.2 07/03/2017 1550   LDLCALC 40 01/29/2018 1001   Hepatic Function Panel     Component Value Date/Time   PROT 6.3 01/29/2018 1001   ALBUMIN 4.2 01/29/2018 1001   AST 29 01/29/2018 1001   ALT 22 01/29/2018 1001   ALKPHOS 103 01/29/2018 1001   BILITOT 0.4 01/29/2018 1001   BILIDIR 0.1 06/01/2013 0745   IBILI 0.6 02/02/2013 0850      Component Value Date/Time   TSH 1.900 01/29/2018 1001   TSH 2.99 09/06/2016 0751   TSH 3.21 03/14/2016 0757   Results for DESSIE, TATEM (MRN [id number]) as of 09/10/2018 14:11  Ref. Range 01/29/2018 10:01  Vitamin D, 25-Hydroxy Latest Ref Range: 30.0 - 100.0 ng/mL 23.7 (L)   I, Michaelene Song, am acting as Location manager for [employer], FNP I have reviewed the above documentation for accuracy and completeness, and I agree with the above.  - Marlaysia Lenig, FNP-C.

## 2018-09-11 DIAGNOSIS — F329 Major depressive disorder, single episode, unspecified: Secondary | ICD-10-CM | POA: Insufficient documentation

## 2018-09-11 DIAGNOSIS — F32A Depression, unspecified: Secondary | ICD-10-CM | POA: Insufficient documentation

## 2018-09-24 ENCOUNTER — Other Ambulatory Visit: Payer: Self-pay

## 2018-09-24 ENCOUNTER — Ambulatory Visit
Admission: RE | Admit: 2018-09-24 | Discharge: 2018-09-24 | Disposition: A | Discharge status: Home / Self Care | Payer: Medicare Other | Source: Ambulatory Visit | Attending: Family Medicine | Admitting: Family Medicine

## 2018-09-24 DIAGNOSIS — Z1231 Encounter for screening mammogram for malignant neoplasm of breast: Secondary | ICD-10-CM

## 2018-09-25 ENCOUNTER — Ambulatory Visit (INDEPENDENT_AMBULATORY_CARE_PROVIDER_SITE_OTHER): Payer: Medicare Other | Admitting: Family Medicine

## 2018-09-25 ENCOUNTER — Other Ambulatory Visit: Payer: Self-pay

## 2018-09-25 VITALS — BP 93/39 | HR 48 | Temp 97.6°F | Ht 59.0 in | Wt 157.8 lb

## 2018-09-25 DIAGNOSIS — L570 Actinic keratosis: Secondary | ICD-10-CM | POA: Diagnosis not present

## 2018-09-25 DIAGNOSIS — R7303 Prediabetes: Secondary | ICD-10-CM | POA: Diagnosis not present

## 2018-09-25 DIAGNOSIS — I10 Essential (primary) hypertension: Secondary | ICD-10-CM

## 2018-09-25 DIAGNOSIS — K912 Postsurgical malabsorption, not elsewhere classified: Secondary | ICD-10-CM

## 2018-09-25 DIAGNOSIS — E559 Vitamin D deficiency, unspecified: Secondary | ICD-10-CM | POA: Diagnosis not present

## 2018-09-25 DIAGNOSIS — D500 Iron deficiency anemia secondary to blood loss (chronic): Secondary | ICD-10-CM | POA: Diagnosis not present

## 2018-09-25 DIAGNOSIS — L814 Other melanin hyperpigmentation: Secondary | ICD-10-CM | POA: Diagnosis not present

## 2018-09-25 DIAGNOSIS — R0602 Shortness of breath: Secondary | ICD-10-CM

## 2018-09-25 DIAGNOSIS — L821 Other seborrheic keratosis: Secondary | ICD-10-CM | POA: Diagnosis not present

## 2018-09-25 DIAGNOSIS — E7849 Other hyperlipidemia: Secondary | ICD-10-CM

## 2018-09-25 DIAGNOSIS — D0439 Carcinoma in situ of skin of other parts of face: Secondary | ICD-10-CM | POA: Diagnosis not present

## 2018-09-25 DIAGNOSIS — R5383 Other fatigue: Secondary | ICD-10-CM | POA: Diagnosis not present

## 2018-09-25 DIAGNOSIS — D485 Neoplasm of uncertain behavior of skin: Secondary | ICD-10-CM | POA: Diagnosis not present

## 2018-09-25 DIAGNOSIS — D508 Other iron deficiency anemias: Secondary | ICD-10-CM

## 2018-09-25 DIAGNOSIS — E669 Obesity, unspecified: Secondary | ICD-10-CM

## 2018-09-25 DIAGNOSIS — L820 Inflamed seborrheic keratosis: Secondary | ICD-10-CM | POA: Diagnosis not present

## 2018-09-25 DIAGNOSIS — Z85828 Personal history of other malignant neoplasm of skin: Secondary | ICD-10-CM | POA: Diagnosis not present

## 2018-09-25 DIAGNOSIS — Z6831 Body mass index (BMI) 31.0-31.9, adult: Secondary | ICD-10-CM | POA: Diagnosis not present

## 2018-09-25 DIAGNOSIS — Z411 Encounter for cosmetic surgery: Secondary | ICD-10-CM | POA: Diagnosis not present

## 2018-09-25 DIAGNOSIS — D225 Melanocytic nevi of trunk: Secondary | ICD-10-CM | POA: Diagnosis not present

## 2018-09-25 MED ORDER — VALSARTAN 40 MG PO TABS: 40.0000 mg | ORAL_TABLET | Freq: Every day | ORAL | 0 refills | Status: DC

## 2018-09-25 MED ORDER — VITAMIN D (ERGOCALCIFEROL) 1.25 MG (50000 UNIT) PO CAPS: 50000.0000 [IU] | ORAL_CAPSULE | ORAL | 0 refills | Status: DC

## 2018-09-27 DIAGNOSIS — Z23 Encounter for immunization: Secondary | ICD-10-CM | POA: Diagnosis not present

## 2018-09-28 LAB — COMPREHENSIVE METABOLIC PANEL
ALT: 20 IU/L (ref 0–32)
AST: 26 IU/L (ref 0–40)
Albumin/Globulin Ratio: 2.2 (ref 1.2–2.2)
Albumin: 4.2 g/dL (ref 3.7–4.7)
Alkaline Phosphatase: 72 IU/L (ref 39–117)
BUN/Creatinine Ratio: 28 (ref 12–28)
BUN: 23 mg/dL (ref 8–27)
Bilirubin Total: 0.3 mg/dL (ref 0.0–1.2)
CO2: 25 mmol/L (ref 20–29)
Calcium: 8.9 mg/dL (ref 8.7–10.3)
Chloride: 102 mmol/L (ref 96–106)
Creatinine, Ser: 0.82 mg/dL (ref 0.57–1.00)
GFR calc Af Amer: 83 mL/min/{1.73_m2} (ref >59)
GFR calc non Af Amer: 72 mL/min/{1.73_m2} (ref >59)
Globulin, Total: 1.9 g/dL (ref 1.5–4.5)
Glucose: 82 mg/dL (ref 65–99)
Potassium: 4.1 mmol/L (ref 3.5–5.2)
Sodium: 139 mmol/L (ref 134–144)
Total Protein: 6.1 g/dL (ref 6.0–8.5)

## 2018-09-28 LAB — CBC WITH DIFFERENTIAL/PLATELET
Basophils Absolute: 0 10*3/uL (ref 0.0–0.2)
Basos: 1 %
EOS (ABSOLUTE): 0.4 10*3/uL (ref 0.0–0.4)
Eos: 8 %
Hemoglobin: 12.1 g/dL (ref 11.1–15.9)
Immature Grans (Abs): 0 10*3/uL (ref 0.0–0.1)
Immature Granulocytes: 1 %
Lymphocytes Absolute: 1.6 10*3/uL (ref 0.7–3.1)
Lymphs: 29 %
MCH: 31.3 pg (ref 26.6–33.0)
MCHC: 31.8 g/dL (ref 31.5–35.7)
MCV: 98 fL — ABNORMAL HIGH (ref 79–97)
Monocytes Absolute: 0.7 10*3/uL (ref 0.1–0.9)
Monocytes: 13 %
Neutrophils Absolute: 2.7 10*3/uL (ref 1.4–7.0)
Neutrophils: 48 %
Platelets: 201 10*3/uL (ref 150–450)
RBC: 3.87 x10E6/uL (ref 3.77–5.28)
RDW: 14.1 % (ref 11.7–15.4)
WBC: 5.5 10*3/uL (ref 3.4–10.8)

## 2018-09-28 LAB — HEMOGLOBIN A1C
Est. average glucose Bld gHb Est-mCnc: 103 mg/dL
Hgb A1c MFr Bld: 5.2 % (ref 4.8–5.6)

## 2018-09-28 LAB — VITAMIN D 25 HYDROXY (VIT D DEFICIENCY, FRACTURES): Vit D, 25-Hydroxy: 38.3 ng/mL (ref 30.0–100.0)

## 2018-09-28 LAB — LIPID PANEL WITH LDL/HDL RATIO
Cholesterol, Total: 125 mg/dL (ref 100–199)
HDL: 57 mg/dL (ref >39)
LDL Chol Calc (NIH): 54 mg/dL (ref 0–99)
LDL/HDL Ratio: 0.9 ratio (ref 0.0–3.2)
Triglycerides: 66 mg/dL (ref 0–149)
VLDL Cholesterol Cal: 14 mg/dL (ref 5–40)

## 2018-09-28 LAB — ANEMIA PANEL
Ferritin: 29 ng/mL (ref 15–150)
Folate, Hemolysate: 451 ng/mL
Folate, RBC: 1187 ng/mL (ref >498)
Hematocrit: 38 % (ref 34.0–46.6)
Iron Saturation: 23 % (ref 15–55)
Iron: 71 ug/dL (ref 27–139)
Retic Ct Pct: 0.9 % (ref 0.6–2.6)
Total Iron Binding Capacity: 314 ug/dL (ref 250–450)
UIBC: 243 ug/dL (ref 118–369)
Vitamin B-12: 472 pg/mL (ref 232–1245)

## 2018-09-28 LAB — T3: T3, Total: 93 ng/dL (ref 71–180)

## 2018-09-28 LAB — TSH: TSH: 2.16 u[IU]/mL (ref 0.450–4.500)

## 2018-09-28 LAB — T4, FREE: Free T4: 1.1 ng/dL (ref 0.82–1.77)

## 2018-09-28 LAB — INSULIN, RANDOM: INSULIN: 3.5 u[IU]/mL (ref 2.6–24.9)

## 2018-09-28 LAB — VITAMIN B1: Thiamine: 158 nmol/L (ref 66.5–200.0)

## 2018-09-30 NOTE — Progress Notes (Signed)
Office: [phone]  /  Fax: [phone]   HPI:   Chief Complaint: OBESITY [name] is here to discuss her progress with her obesity treatment plan. She is on the keep a food journal with 1200 calories and 85 grams of protein daily plan and is following her eating plan approximately 50 % of the time. She states she is walking 20 to 30 minutes 4 to 5 times per week. [name] is still reporting situational depression due to her son's job loss. [name] is not getting all of the protein in. Her weight is 157 lb 12.8 oz (71.6 kg) today and has had a weight loss of 17 pounds since her last in-office visit on 04/02/18. She has done quite well on the plan during her Telehealth visits.  She has lost 29 lbs since starting treatment with [place]. Her RMR was repeated today and has increased to [place] from 1775.  Post Operative intestinal malabsorption [name] has a history of duodenal switch in 2003 and does not follow up with her surgeon for yearly check ups.   Vitamin D deficiency [name] has a diagnosis of vitamin D deficiency. She is currently taking vit D and her last vitamin D level was at 23.7 on 01/29/18. She denies nausea, vomiting or muscle weakness.  Pre-Diabetes [name] has a diagnosis of prediabetes based on her elevated Hgb A1c. Her last A1c was 5.7 on 01/29/18. She was diabetic prior to her bariatric surgery. . She is not taking metformin currently and continues to work on diet and exercise to decrease risk of diabetes. She denies polyphagia or hypoglycemia.  Iron Deficiency Anemia [name] has a diagnosis of anemia. She is not on oral iron supplement. Her last Hgb was at 10.8. She is unable to tolerate oral iron due to nausea. [name] has had iron infusions in the past.  Fatigue [name] reports fatigue. [name] admits to cold intolerance. She denies palpitations.   Hyperlipidemia [name] has hyperlipidemia and she is on Crestor. Her last fasting lipid panel was normal. She has been trying to improve her cholesterol levels  with intensive lifestyle modification including a low saturated fat diet, exercise and weight loss. She denies any chest pain or myalgias.   Hypertension [name] is a 73 y.o. female with hypertension. Her blood pressure is low today (93/39). She admits to dizziness. She is currently on valsartan/ HCTZ 80/12.5 mg.  [name] denies chest pain or shortness of breath. She is working weight loss to help control her blood pressure with the goal of decreasing her risk of heart attack and stroke.   Dyspnea with Exercise [name] notes increasing shortness of breath with exercising and seems to be worsening over time with weight gain. She notes getting out of breath sooner with activity than she used to.   ASSESSMENT AND PLAN:  Postoperative intestinal malabsorption - Plan: CBC with Differential/Platelet, Vitamin B12  Vitamin D deficiency - Plan: Vitamin D, Ergocalciferol, (DRISDOL) 1.25 MG (50000 UT) CAPS capsule, VITAMIN D 25 Hydroxy (Vit-D Deficiency, Fractures), Vitamin B1  Prediabetes - Plan: Comprehensive metabolic panel, Hemoglobin A1c, Insulin, random  Other iron deficiency anemia - Plan: CBC with Differential/Platelet, Anemia panel  Other fatigue - Plan: T3, T4, free, TSH, Vitamin B1  Other hyperlipidemia - Plan: Lipid Panel With LDL/HDL Ratio  Essential hypertension - Plan: valsartan (DIOVAN) 40 MG tablet  Shortness of breath on exertion  Class 1 obesity with body mass index (BMI) of 31.0 to 31.9 in adult, unspecified obesity type, unspecified whether serious comorbidity  present  PLAN:  Post Operative intestinal malabsorption We will check vitamin B12, thiamine, CBC and anemia panel today.  Vitamin D Deficiency [name] was informed that low vitamin D levels contributes to fatigue and are associated with obesity, breast, and colon cancer. [name] agrees to take prescription Vit D @50 ,000 IU every week #4 with no refills and she will follow up for routine testing of vitamin D, at  least 2-3 times per year. She was informed of the risk of over-replacement of vitamin D and agrees to not increase her dose unless she discusses this with [place] first. We will check vitamin D level today and [name] agrees to follow up as directed.  Pre-Diabetes [name] will continue to work on weight loss, exercise, and decreasing simple carbohydrates in her diet to help decrease the risk of diabetes. She was informed that eating too many simple carbohydrates or too many calories at one sitting increases the likelihood of GI side effects. We will check A1c, insulin level and CMP today. [name] agreed to follow up with [place] as directed to monitor her progress.  Iron Deficiency Anemia The diagnosis of Iron deficiency anemia was discussed with [name] and was explained in detail. She was given suggestions of iron rich foods. We will check CBC and anemia panel today.  Fatigue We will check thyroid panel and in the meanwhile [name] will continue to work on diet, exercise and weight loss to help with fatigue. Proper sleep hygiene was discussed including the need for 7-8 hours of quality sleep each night.  Hyperlipidemia [name] was informed of the American Heart Association Guidelines emphasizing intensive lifestyle modifications as the first line treatment for hyperlipidemia. We discussed many lifestyle modifications today in depth, and [name] will continue to work on decreasing saturated fats such as fatty red meat, butter and many fried foods. She will also increase vegetables and lean protein in her diet and continue to work on exercise and weight loss efforts. We will check fasting lipid panel today.  Hypertension BP likely low due to recent weight loss. She agrees to start Valsartan 40 mg daily #30 with no refills and discontinue Valsartan-HCTZ 80/12.5.   Dyspnea with exercise Linday's shortness of breath appears to be obesity related and exercise induced. The indirect calorimeter results showed VO2 of 271 and a REE of  1886 (BMR of 1238). She will continue to work on weight loss and gradually increase exercise to treat her exercise induced shortness of breath.   Obesity [name] is currently in the action stage of change. As such, her goal is to continue with weight loss efforts  We will increase her calorie range based on today's IC results.  She has agreed to keep a food journal with 1300 to 1400 calories and 85 to 90 grams of protein daily.  [name] will continue her exercise regimen for weight loss and overall health benefits. We discussed the following Behavioral Modification Strategies today: planning for success, increasing lean protein intake and work on meal planning and easy cooking plans  [name]'s RMR has increased 111 calories. We will increase calories per day for journaling. I encouraged her to establish care with a local PCP. [facility] to [place], [place].  [name] has agreed to follow up with our clinic in 2 to 3 weeks. She was informed of the importance of frequent follow up visits to maximize her success with intensive lifestyle modifications for her multiple health conditions.  ALLERGIES: Allergies  Allergen Reactions  . Hydrocodone Nausea And  Vomiting     nausea and vomiting and headaches  . Amoxicillin Hives  . Cephalexin      Neuropathy  . Codeine     Crazy in the head, bp drops  . Levofloxacin Itching  . Lyrica [Pregabalin] Other (See Comments)    "Makes me like a zombie. I'm awake but I'm in slow motion."  . Meperidine Hcl     B/P drops  . Morphine And Related      States" extreme migraines"  . Oxycodone-Acetaminophen Other (See Comments)    Crazy in head, drop in bp  . Oxycodone-Acetaminophen Other (See Comments)  . Sulfonamide Derivatives Nausea And Vomiting    Stomach upset  . Valsartan-Hydrochlorothiazide Other (See Comments)    Headaches  . Adhesive [Tape] Rash    Latex in adhesive tape. Please use paper tape  . Erythromycin Diarrhea and Rash     Other reaction(s): GI Intolerance  . Penicillins Nausea And Vomiting and Rash    MEDICATIONS: Current Outpatient Medications on File Prior to Visit  Medication Sig Dispense Refill  . ALPRAZolam (XANAX) 0.25 MG tablet Take 1 tablet (0.25 mg total) by mouth daily as needed. For anxiety 30 tablet 3  . aspirin 81 MG tablet Take 81 mg by mouth daily.    [name] buPROPion (WELLBUTRIN SR) 200 MG 12 hr tablet Take 1 tablet (200 mg total) by mouth daily. 30 tablet 0  . DULoxetine (CYMBALTA) 60 MG capsule Take 1 capsule (60 mg total) by mouth daily. 90 capsule 0  . hydrOXYzine (ATARAX/VISTARIL) 10 MG tablet Take 1-2 tablets (10-20 mg total) by mouth 3 (three) times daily as needed for itching. 30 tablet 1  . levocetirizine (XYZAL) 5 MG tablet Take 5 mg by mouth daily.     . methylphenidate (RITALIN) 10 MG tablet Take 1 tablet (10 mg total) by mouth as needed. 60 tablet 0  . MYRBETRIQ 50 MG TB24 tablet daily.    . NON FORMULARY CPAP- set on 12 [employer]. 6/24 pt is currently with [facility]    . omeprazole (PRILOSEC) 20 MG capsule Take 20 mg by mouth daily.    . rosuvastatin (CRESTOR) 20 MG tablet Take 1 tablet (20 mg total) by mouth daily. 90 tablet 1  . traZODone (DESYREL) 100 MG tablet TAKE 1/2 TO 1 (ONE-HALF TO ONE) TABLET BY MOUTH ONCE DAILY AS NEEDED FOR SLEEP 90 tablet 2   No current facility-administered medications on file prior to visit.     PAST MEDICAL HISTORY: Past Medical History:  Diagnosis Date  . Allergy    rhinitis  . Anemia 02-01-12   iron absorption problem  . Arthritis 02-01-12   osteoarthritis, Back pain, spine surgeries ? retain hardware cervial and  lumbar.  . Asthma   . Back pain   . Cancer ([place]) 02-01-12   squamous cell -face  . Depression with anxiety 01/08/2011  . Diabetes ([place])   . Dyspnea   . Esophageal reflux   . Fibromyalgia   . Gallbladder problem   . Generalized headaches   . Heart murmur   . HLD (hyperlipidemia)   . HTN (hypertension)   . Hyperparathyroidism  ([place]) 08/18/2014  . Joint pain   . Knee pain, right 09/20/2011  . Motion sickness 12/24/2011  . Nausea & vomiting 12/24/2011  . Nerve pain   . Obesity   . OSA (obstructive sleep apnea)   . Osteoarthritis   . Osteopenia 08/05/2014  . Panic attacks   . Pelvic floor dysfunction 09/02/2016  .  Sleep apnea, obstructive   . Stress incontinence   . Varicose veins of lower limb 10/02/2011  . Vasovagal syncope 11/22/2017  . Vitamin D deficiency     PAST SURGICAL HISTORY: Past Surgical History:  Procedure Laterality Date  . ABDOMINAL SURGERY  02-01-12   ABDOMINAL SURGERY  . APPENDECTOMY  1962  . BARIATRIC SURGERY    . BLADDER SUSPENSION     complicated by bowel nick/ clolostomy  . BLEPHAROPLASTY Bilateral   . CARPAL TUNNEL RELEASE  02-01-12   right  . CATARACT EXTRACTION Bilateral   . CERVICAL DISC SURGERY     C-7 in 2004, C-4 and C-5 in 2010  . colostomy bag     Confirm with patient. Listed under medical conditions on form dated 07/18/09.  [name] COLOSTOMY REVERSAL  02-01-12  . EYE SURGERY     b/l cataracts and b/l blepharplasty  . [place]  . GASTRIC BYPASS  2003   Patient also noted "gastric bypass resection - 2004"  . HERNIA REPAIR  2009   Two hernias and abdominal reconstruction  . ivc filter     prophyllactically- no hx DVT  . LUMBAR DISC SURGERY     L1-L3 all had surgery most recently in 2017  . LUMBAR DISC SURGERY     L3-L4, Dr. Trenton Gammon  . pannilectomy     Per medical history form dated 07/18/09.  [name] TOTAL KNEE ARTHROPLASTY  02/11/2012   Procedure: TOTAL KNEE ARTHROPLASTY;  Surgeon: Mauri Pole, MD;  Location: [facility];  Service: Orthopedics;  Laterality: Right;  . ULNAR NERVE REPAIR Left 07/18/2016   by Dr.Henry Pool  . VULVA SURGERY  02-01-12   cyst removal-pt 8 months pregnant    SOCIAL HISTORY: Social History   Tobacco Use  . Smoking status: Never Smoker  . Smokeless tobacco: Never Used  Substance Use Topics  . Alcohol use: No    Alcohol/week: 0.0  standard drinks    Comment: special occasions  . Drug use: No    FAMILY HISTORY: Family History  Problem Relation Age of Onset  . Alcohol abuse Mother   . Other Mother        accidental med overdose  . Emphysema Mother        smoked  . Bipolar disorder Mother   . Sudden death Mother   . Liver disease Mother   . Other Father        CHF  . Diabetes Father   . Cancer Father        throat ca/ smoked  . Alcohol abuse Father   . Stroke Father   . Hypertension Father   . Liver disease Father   . Hypertension Brother   . Hyperlipidemia Brother   . Obesity Brother   . Heart attack Maternal Grandfather   . Heart attack Paternal Grandmother   . Heart attack Paternal Grandfather   . Arthritis Son        psoriatic  . Arthritis Son        psoriatic  . Obesity Son     ROS: Review of Systems  Constitutional: Positive for malaise/fatigue and weight loss.  Respiratory: Positive for shortness of breath (on exertion).   Cardiovascular: Negative for chest pain and palpitations.  Gastrointestinal: Negative for nausea and vomiting.  Musculoskeletal: Negative for myalgias.       Negative for muscle weakness  Neurological: Positive for dizziness.  Endo/Heme/Allergies:       Negative for polyphagia Negative for hypoglycemia  Positive for cold intolerance    PHYSICAL EXAM: Blood pressure (!) 93/39, pulse (!) 48, temperature 97.6 F (36.4 C), temperature source Oral, height 4\' 11"  (1.499 m), weight 157 lb 12.8 oz (71.6 kg), SpO2 99 %. Body mass index is 31.87 kg/m. Physical Exam Vitals signs reviewed.  Constitutional:      Appearance: Normal appearance. She is well-developed. She is obese.  Cardiovascular:     Rate and Rhythm: Normal rate.  Pulmonary:     Effort: Pulmonary effort is normal.  Musculoskeletal: Normal range of motion.  Skin:    General: Skin is warm and dry.  Neurological:     Mental Status: She is alert and oriented to person, place, and time.  Psychiatric:         Mood and Affect: Mood normal.        Behavior: Behavior normal.     RECENT LABS AND TESTS: BMET    Component Value Date/Time   NA 139 09/25/2018 0950   K 4.1 09/25/2018 0950   CL 102 09/25/2018 0950   CO2 25 09/25/2018 0950   GLUCOSE 82 09/25/2018 0950   GLUCOSE 105 (H) 07/10/2017 1835   BUN 23 09/25/2018 0950   CREATININE 0.82 09/25/2018 0950   CREATININE 0.83 11/28/2012 1153   CALCIUM 8.9 09/25/2018 0950   GFRNONAA 72 09/25/2018 0950   GFRAA 83 09/25/2018 0950   Lab Results  Component Value Date   HGBA1C 5.2 09/25/2018   HGBA1C 5.7 (H) 01/29/2018   HGBA1C 5.9 07/03/2017   HGBA1C 5.9 09/06/2016   HGBA1C 5.8 03/14/2016   Lab Results  Component Value Date   INSULIN 3.5 09/25/2018   INSULIN 4.3 01/29/2018   CBC    Component Value Date/Time   WBC 5.5 09/25/2018 0950   WBC 13.0 (H) 07/10/2017 1835   RBC 3.87 09/25/2018 0950   RBC 3.88 07/10/2017 1835   HGB 12.1 09/25/2018 0950   HGB 12.4 02/19/2011 1425   HCT 38.0 09/25/2018 0950   HCT 37.1 02/19/2011 1425   PLT 201 09/25/2018 0950   MCV 98 (H) 09/25/2018 0950   MCV 93.7 02/19/2011 1425   MCH 31.3 09/25/2018 0950   MCH 28.6 07/10/2017 1835   MCHC 31.8 09/25/2018 0950   MCHC 32.2 07/10/2017 1835   RDW 14.1 09/25/2018 0950   RDW 13.7 02/19/2011 1425   LYMPHSABS 1.6 09/25/2018 0950   LYMPHSABS 1.1 02/19/2011 1425   MONOABS 1.1 (H) 07/10/2017 1835   MONOABS 0.5 02/19/2011 1425   EOSABS 0.4 09/25/2018 0950   BASOSABS 0.0 09/25/2018 0950   BASOSABS 0.0 02/19/2011 1425   Iron/TIBC/Ferritin/ %Sat    Component Value Date/Time   IRON 71 09/25/2018 0950   TIBC 314 09/25/2018 0950   FERRITIN 29 09/25/2018 0950   IRONPCTSAT 23 09/25/2018 0950   IRONPCTSAT 17 (L) 02/19/2011 1425   Lipid Panel     Component Value Date/Time   CHOL 125 09/25/2018 0950   TRIG 66 09/25/2018 0950   HDL 57 09/25/2018 0950   CHOLHDL 2 07/03/2017 1550   VLDL 13.2 07/03/2017 1550   LDLCALC 40 01/29/2018 1001   Hepatic  Function Panel     Component Value Date/Time   PROT 6.1 09/25/2018 0950   ALBUMIN 4.2 09/25/2018 0950   AST 26 09/25/2018 0950   ALT 20 09/25/2018 0950   ALKPHOS 72 09/25/2018 0950   BILITOT 0.3 09/25/2018 0950   BILIDIR 0.1 06/01/2013 0745   IBILI 0.6 02/02/2013 0850      Component Value  Date/Time   TSH 2.160 09/25/2018 0950   TSH 1.900 01/29/2018 1001   TSH 2.99 09/06/2016 0751     Ref. Range 01/29/2018 10:01  Vitamin D, 25-Hydroxy Latest Ref Range: 30.0 - 100.0 ng/mL 23.7 (L)    OBESITY BEHAVIORAL INTERVENTION VISIT  Today's visit was # 15   Starting weight: 186 lbs Starting date: 01/28/2018 Today's weight : 157 lbs Today's date: 09/25/2018 Total lbs lost to date: 29    09/25/2018  Height 4\' 11"  (1.499 m)  Weight 157 lb 12.8 oz (71.6 kg)  BMI (Calculated) 31.85  BLOOD PRESSURE - SYSTOLIC 93  BLOOD PRESSURE - DIASTOLIC 39   Body Fat % 99991111 %  Total Body Water (lbs) 68 lbs  RMR 1886    ASK: We discussed the diagnosis of obesity with Lisandra T Kassin today and Rossie agreed to give [place] permission to discuss obesity behavioral modification therapy today.  ASSESS: Shemariah has the diagnosis of obesity and her BMI today is 31.85 Elynn is in the action stage of change   ADVISE: Roshani was educated on the multiple health risks of obesity as well as the benefit of weight loss to improve her health. She was advised of the need for long term treatment and the importance of lifestyle modifications to improve her current health and to decrease her risk of future health problems.  AGREE: Multiple dietary modification options and treatment options were discussed and  Atonya agreed to follow the recommendations documented in the above note.  ARRANGE: Adele was educated on the importance of frequent visits to treat obesity as outlined per CMS and USPSTF guidelines and agreed to schedule her next follow up appointment today.  I, Doreene Nest, am acting as transcriptionist for [employer],  FNP-C  I have reviewed the above documentation for accuracy and completeness, and I agree with the above.  - Cooper Stamp, FNP-C.

## 2018-10-01 ENCOUNTER — Encounter (INDEPENDENT_AMBULATORY_CARE_PROVIDER_SITE_OTHER): Payer: Self-pay | Admitting: Family Medicine

## 2018-10-08 ENCOUNTER — Other Ambulatory Visit (INDEPENDENT_AMBULATORY_CARE_PROVIDER_SITE_OTHER): Payer: Self-pay | Admitting: Family Medicine

## 2018-10-08 DIAGNOSIS — F3289 Other specified depressive episodes: Secondary | ICD-10-CM

## 2018-10-09 ENCOUNTER — Other Ambulatory Visit (INDEPENDENT_AMBULATORY_CARE_PROVIDER_SITE_OTHER): Payer: Self-pay

## 2018-10-09 DIAGNOSIS — F3289 Other specified depressive episodes: Secondary | ICD-10-CM

## 2018-10-09 MED ORDER — BUPROPION HCL ER (SR) 200 MG PO TB12: 200.0000 mg | ORAL_TABLET | Freq: Every day | ORAL | 0 refills | Status: DC

## 2018-10-09 NOTE — Telephone Encounter (Signed)
Refill request came through from [facility] in [place]. Patient states she is headed back to [place] and would like to have the rx sent to [facility]. Provider approved the request medication sent to the pharmacy.

## 2018-10-14 ENCOUNTER — Other Ambulatory Visit: Payer: Self-pay

## 2018-10-14 ENCOUNTER — Telehealth (INDEPENDENT_AMBULATORY_CARE_PROVIDER_SITE_OTHER): Payer: Medicare Other | Admitting: Family Medicine

## 2018-10-14 ENCOUNTER — Encounter (INDEPENDENT_AMBULATORY_CARE_PROVIDER_SITE_OTHER): Payer: Self-pay | Admitting: Family Medicine

## 2018-10-14 DIAGNOSIS — E559 Vitamin D deficiency, unspecified: Secondary | ICD-10-CM | POA: Diagnosis not present

## 2018-10-14 DIAGNOSIS — Z6831 Body mass index (BMI) 31.0-31.9, adult: Secondary | ICD-10-CM | POA: Diagnosis not present

## 2018-10-14 DIAGNOSIS — E669 Obesity, unspecified: Secondary | ICD-10-CM

## 2018-10-14 DIAGNOSIS — I10 Essential (primary) hypertension: Secondary | ICD-10-CM

## 2018-10-14 MED ORDER — VITAMIN D (ERGOCALCIFEROL) 1.25 MG (50000 UNIT) PO CAPS: 50000.0000 [IU] | ORAL_CAPSULE | ORAL | 0 refills | Status: DC

## 2018-10-14 MED ORDER — VALSARTAN 40 MG PO TABS: 40.0000 mg | ORAL_TABLET | Freq: Every day | ORAL | 0 refills | Status: DC

## 2018-10-15 DIAGNOSIS — D0430 Carcinoma in situ of skin of unspecified part of face: Secondary | ICD-10-CM | POA: Diagnosis not present

## 2018-10-15 DIAGNOSIS — Z23 Encounter for immunization: Secondary | ICD-10-CM | POA: Diagnosis not present

## 2018-10-15 DIAGNOSIS — L820 Inflamed seborrheic keratosis: Secondary | ICD-10-CM | POA: Diagnosis not present

## 2018-10-16 NOTE — Progress Notes (Addendum)
Office: [phone]  /  Fax: [phone] TeleHealth Visit:  Alferd Apa Carnathan has verbally consented to this TeleHealth visit today. The patient is located at her son's house, the provider is located at the [facility] office. The participants in this visit include the listed provider and patient and any and all parties involved. The visit was conducted today via telephone (25 minute call). Kellyn was unable to use realtime audiovisual technology today (FaceTime unable to connect) and the telehealth visit was conducted via telephone.  HPI:   Chief Complaint: OBESITY Dmaya is here to discuss her progress with her obesity treatment plan. She is on the keep a food journal with 1400 calories and 85 grams of protein daily plan and she is following her eating plan approximately 60 to 70 % of the time. She states she is walking 30 minutes 4 times per week. Fahima has lost 1 pound and her weight is 156 pounds today (weight 156 lbs 10/10/18). She is frustrated with her current plateau of weight. We were unable to weigh the patient today for this TeleHealth visit. She feels as if she has lost weight since her last visit. She has lost 29 lbs since starting treatment with [place].  Hypertension DIAMONDE KRUSZEWSKI is a 73 y.o. female with hypertension. We recently dropped off HCTZ due to symptomatic hypotension and she is only taking Valsartan. Her blood pressure is very good at home, not as low as it had been. Alferd Apa Nesheim denies chest pain or shortness of breath on exertion. She is working weight loss to help control her blood pressure with the goal of decreasing her risk of heart attack and stroke. Elnas blood pressure is currently controlled. BP Readings from Last 3 Encounters:  09/25/18 (!) 93/39  07/16/18 118/60  04/02/18 109/64    Vitamin D deficiency Maalle has a diagnosis of vitamin D deficiency. Her vitamin D level is up to 38 from 23.7, but she is still not at goal; probably due to history of  duodenal switch. She is currently taking vit D and denies nausea, vomiting or muscle weakness.  ASSESSMENT AND PLAN:  Essential hypertension - Plan: valsartan (DIOVAN) 40 MG tablet  Vitamin D deficiency - Plan: Vitamin D, Ergocalciferol, (DRISDOL) 1.25 MG (50000 UT) CAPS capsule  Class 1 obesity with body mass index (BMI) of 31.0 to 31.9 in adult, unspecified obesity type, unspecified whether serious comorbidity present  PLAN:  Hypertension We discussed sodium restriction, working on healthy weight loss, and a regular exercise program as the means to achieve improved blood pressure control. Mahreen agreed with this plan and agreed to follow up as directed. We will continue to monitor her blood pressure as well as her progress with the above lifestyle modifications. She agrees to continue Valsartan 40 mg daily #30 with no refills and she will watch for signs of hypotension as she continues her lifestyle modifications.  Vitamin D Deficiency Verdis was informed that low vitamin D levels contributes to fatigue and are associated with obesity, breast, and colon cancer. Caisley agrees to continue to take prescription Vit D @50 ,000 IU and increase dose to every 3 days #30 with no refills and she will follow up for routine testing of vitamin D, at least 2-3 times per year. She was informed of the risk of over-replacement of vitamin D and agrees to not increase her dose unless she discusses this with [place] first. Lashanta agrees to follow up with our clinic in 2 weeks.  Obesity Kyrene  is currently in the action stage of change. As such, her goal is to continue with weight loss efforts She has agreed to keep a food journal with 1300 calories and 85 to 90 grams of protein daily Jessic will add resistance exercise at the gym 2 times per week for weight loss and overall health benefits. We discussed the following Behavioral Modification Strategies today: planning for success, keep a strict food journal and increasing lean  protein intake Amabel will decrease her calories to 1300 per day.  Laneya has agreed to follow up with our clinic in 2 weeks. She was informed of the importance of frequent follow up visits to maximize her success with intensive lifestyle modifications for her multiple health conditions.  ALLERGIES: Allergies  Allergen Reactions  . Hydrocodone Nausea And Vomiting     nausea and vomiting and headaches  . Amoxicillin Hives  . Cephalexin      Neuropathy  . Codeine     Crazy in the head, bp drops  . Levofloxacin Itching  . Lyrica [Pregabalin] Other (See Comments)    "Makes me like a zombie. I'm awake but I'm in slow motion."  . Meperidine Hcl     B/P drops  . Morphine And Related      States" extreme migraines"  . Oxycodone-Acetaminophen Other (See Comments)    Crazy in head, drop in bp  . Oxycodone-Acetaminophen Other (See Comments)  . Sulfonamide Derivatives Nausea And Vomiting    Stomach upset  . Valsartan-Hydrochlorothiazide Other (See Comments)    Headaches  . Adhesive [Tape] Rash    Latex in adhesive tape. Please use paper tape  . Erythromycin Diarrhea and Rash    Other reaction(s): GI Intolerance  . Penicillins Nausea And Vomiting and Rash    MEDICATIONS: Current Outpatient Medications on File Prior to Visit  Medication Sig Dispense Refill  . ALPRAZolam (XANAX) 0.25 MG tablet Take 1 tablet (0.25 mg total) by mouth daily as needed. For anxiety 30 tablet 3  . aspirin 81 MG tablet Take 81 mg by mouth daily.    [name] buPROPion (WELLBUTRIN SR) 200 MG 12 hr tablet Take 1 tablet (200 mg total) by mouth daily. 30 tablet 0  . DULoxetine (CYMBALTA) 60 MG capsule Take 1 capsule (60 mg total) by mouth daily. 90 capsule 0  . hydrOXYzine (ATARAX/VISTARIL) 10 MG tablet Take 1-2 tablets (10-20 mg total) by mouth 3 (three) times daily as needed for itching. 30 tablet 1  . levocetirizine (XYZAL) 5 MG tablet Take 5 mg by mouth daily.     . methylphenidate (RITALIN) 10 MG tablet Take 1 tablet (10  mg total) by mouth as needed. 60 tablet 0  . MYRBETRIQ 50 MG TB24 tablet daily.    . NON FORMULARY CPAP- set on 12 [employer]. 6/24 pt is currently with [facility]    . omeprazole (PRILOSEC) 20 MG capsule Take 20 mg by mouth daily.    . rosuvastatin (CRESTOR) 20 MG tablet Take 1 tablet (20 mg total) by mouth daily. 90 tablet 1  . traZODone (DESYREL) 100 MG tablet TAKE 1/2 TO 1 (ONE-HALF TO ONE) TABLET BY MOUTH ONCE DAILY AS NEEDED FOR SLEEP 90 tablet 2   No current facility-administered medications on file prior to visit.     PAST MEDICAL HISTORY: Past Medical History:  Diagnosis Date  . Allergy    rhinitis  . Anemia 02-01-12   iron absorption problem  . Arthritis 02-01-12   osteoarthritis, Back pain, spine surgeries ? retain hardware  cervial and  lumbar.  . Asthma   . Back pain   . Cancer ([place]) 02-01-12   squamous cell -face  . Depression with anxiety 01/08/2011  . Diabetes ([place])   . Dyspnea   . Esophageal reflux   . Fibromyalgia   . Gallbladder problem   . Generalized headaches   . Heart murmur   . HLD (hyperlipidemia)   . HTN (hypertension)   . Hyperparathyroidism ([place]) 08/18/2014  . Joint pain   . Knee pain, right 09/20/2011  . Motion sickness 12/24/2011  . Nausea & vomiting 12/24/2011  . Nerve pain   . Obesity   . OSA (obstructive sleep apnea)   . Osteoarthritis   . Osteopenia 08/05/2014  . Panic attacks   . Pelvic floor dysfunction 09/02/2016  . Sleep apnea, obstructive   . Stress incontinence   . Varicose veins of lower limb 10/02/2011  . Vasovagal syncope 11/22/2017  . Vitamin D deficiency     PAST SURGICAL HISTORY: Past Surgical History:  Procedure Laterality Date  . ABDOMINAL SURGERY  02-01-12   ABDOMINAL SURGERY  . APPENDECTOMY  1962  . BARIATRIC SURGERY    . BLADDER SUSPENSION     complicated by bowel nick/ clolostomy  . BLEPHAROPLASTY Bilateral   . CARPAL TUNNEL RELEASE  02-01-12   right  . CATARACT EXTRACTION Bilateral   . CERVICAL DISC SURGERY     C-7 in  2004, C-4 and C-5 in 2010  . colostomy bag     Confirm with patient. Listed under medical conditions on form dated 07/18/09.  [name] COLOSTOMY REVERSAL  02-01-12  . EYE SURGERY     b/l cataracts and b/l blepharplasty  . [place]  . GASTRIC BYPASS  2003   Patient also noted "gastric bypass resection - 2004"  . HERNIA REPAIR  2009   Two hernias and abdominal reconstruction  . ivc filter     prophyllactically- no hx DVT  . LUMBAR DISC SURGERY     L1-L3 all had surgery most recently in 2017  . LUMBAR DISC SURGERY     L3-L4, Dr. Trenton Gammon  . pannilectomy     Per medical history form dated 07/18/09.  [name] TOTAL KNEE ARTHROPLASTY  02/11/2012   Procedure: TOTAL KNEE ARTHROPLASTY;  Surgeon: Mauri Pole, MD;  Location: [facility];  Service: Orthopedics;  Laterality: Right;  . ULNAR NERVE REPAIR Left 07/18/2016   by Dr.Henry Pool  . VULVA SURGERY  02-01-12   cyst removal-pt 8 months pregnant    SOCIAL HISTORY: Social History   Tobacco Use  . Smoking status: Never Smoker  . Smokeless tobacco: Never Used  Substance Use Topics  . Alcohol use: No    Alcohol/week: 0.0 standard drinks    Comment: special occasions  . Drug use: No    FAMILY HISTORY: Family History  Problem Relation Age of Onset  . Alcohol abuse Mother   . Other Mother        accidental med overdose  . Emphysema Mother        smoked  . Bipolar disorder Mother   . Sudden death Mother   . Liver disease Mother   . Other Father        CHF  . Diabetes Father   . Cancer Father        throat ca/ smoked  . Alcohol abuse Father   . Stroke Father   . Hypertension Father   . Liver disease Father   . Hypertension  Brother   . Hyperlipidemia Brother   . Obesity Brother   . Heart attack Maternal Grandfather   . Heart attack Paternal Grandmother   . Heart attack Paternal Grandfather   . Arthritis Son        psoriatic  . Arthritis Son        psoriatic  . Obesity Son     ROS: Review of Systems  Constitutional:  Positive for weight loss.  Respiratory: Negative for shortness of breath (on exertion).   Cardiovascular: Negative for chest pain.  Gastrointestinal: Negative for nausea and vomiting.  Musculoskeletal:       Negative for muscle weakness    PHYSICAL EXAM: Pt in no acute distress  RECENT LABS AND TESTS: BMET    Component Value Date/Time   NA 139 09/25/2018 0950   K 4.1 09/25/2018 0950   CL 102 09/25/2018 0950   CO2 25 09/25/2018 0950   GLUCOSE 82 09/25/2018 0950   GLUCOSE 105 (H) 07/10/2017 1835   BUN 23 09/25/2018 0950   CREATININE 0.82 09/25/2018 0950   CREATININE 0.83 11/28/2012 1153   CALCIUM 8.9 09/25/2018 0950   GFRNONAA 72 09/25/2018 0950   GFRAA 83 09/25/2018 0950   Lab Results  Component Value Date   HGBA1C 5.2 09/25/2018   HGBA1C 5.7 (H) 01/29/2018   HGBA1C 5.9 07/03/2017   HGBA1C 5.9 09/06/2016   HGBA1C 5.8 03/14/2016   Lab Results  Component Value Date   INSULIN 3.5 09/25/2018   INSULIN 4.3 01/29/2018   CBC    Component Value Date/Time   WBC 5.5 09/25/2018 0950   WBC 13.0 (H) 07/10/2017 1835   RBC 3.87 09/25/2018 0950   RBC 3.88 07/10/2017 1835   HGB 12.1 09/25/2018 0950   HGB 12.4 02/19/2011 1425   HCT 38.0 09/25/2018 0950   HCT 37.1 02/19/2011 1425   PLT 201 09/25/2018 0950   MCV 98 (H) 09/25/2018 0950   MCV 93.7 02/19/2011 1425   MCH 31.3 09/25/2018 0950   MCH 28.6 07/10/2017 1835   MCHC 31.8 09/25/2018 0950   MCHC 32.2 07/10/2017 1835   RDW 14.1 09/25/2018 0950   RDW 13.7 02/19/2011 1425   LYMPHSABS 1.6 09/25/2018 0950   LYMPHSABS 1.1 02/19/2011 1425   MONOABS 1.1 (H) 07/10/2017 1835   MONOABS 0.5 02/19/2011 1425   EOSABS 0.4 09/25/2018 0950   BASOSABS 0.0 09/25/2018 0950   BASOSABS 0.0 02/19/2011 1425   Iron/TIBC/Ferritin/ %Sat    Component Value Date/Time   IRON 71 09/25/2018 0950   TIBC 314 09/25/2018 0950   FERRITIN 29 09/25/2018 0950   IRONPCTSAT 23 09/25/2018 0950   IRONPCTSAT 17 (L) 02/19/2011 1425   Lipid Panel      Component Value Date/Time   CHOL 125 09/25/2018 0950   TRIG 66 09/25/2018 0950   HDL 57 09/25/2018 0950   CHOLHDL 2 07/03/2017 1550   VLDL 13.2 07/03/2017 1550   LDLCALC 40 01/29/2018 1001   Hepatic Function Panel     Component Value Date/Time   PROT 6.1 09/25/2018 0950   ALBUMIN 4.2 09/25/2018 0950   AST 26 09/25/2018 0950   ALT 20 09/25/2018 0950   ALKPHOS 72 09/25/2018 0950   BILITOT 0.3 09/25/2018 0950   BILIDIR 0.1 06/01/2013 0745   IBILI 0.6 02/02/2013 0850      Component Value Date/Time   TSH 2.160 09/25/2018 0950   TSH 1.900 01/29/2018 1001   TSH 2.99 09/06/2016 0751     Ref. Range 09/25/2018 09:50  Vitamin D,  25-Hydroxy Latest Ref Range: 30.0 - 100.0 ng/mL 38.3    I, Doreene Nest, am acting as Location manager for [employer], FNP-C  I have reviewed the above documentation for accuracy and completeness, and I agree with the above.  - Linken Mcglothen, FNP-C.

## 2018-10-28 ENCOUNTER — Telehealth (INDEPENDENT_AMBULATORY_CARE_PROVIDER_SITE_OTHER): Payer: Medicare Other | Admitting: Family Medicine

## 2018-10-28 ENCOUNTER — Other Ambulatory Visit: Payer: Self-pay

## 2018-10-28 ENCOUNTER — Encounter (INDEPENDENT_AMBULATORY_CARE_PROVIDER_SITE_OTHER): Payer: Self-pay | Admitting: Family Medicine

## 2018-10-28 DIAGNOSIS — E669 Obesity, unspecified: Secondary | ICD-10-CM | POA: Diagnosis not present

## 2018-10-28 DIAGNOSIS — I10 Essential (primary) hypertension: Secondary | ICD-10-CM

## 2018-10-28 DIAGNOSIS — F3289 Other specified depressive episodes: Secondary | ICD-10-CM | POA: Diagnosis not present

## 2018-10-28 DIAGNOSIS — Z6831 Body mass index (BMI) 31.0-31.9, adult: Secondary | ICD-10-CM

## 2018-10-28 MED ORDER — BUPROPION HCL ER (SR) 200 MG PO TB12: 200.0000 mg | ORAL_TABLET | Freq: Every day | ORAL | 0 refills | Status: DC

## 2018-10-28 MED ORDER — DULOXETINE HCL 60 MG PO CPEP: 60.0000 mg | ORAL_CAPSULE | Freq: Every day | ORAL | 0 refills | Status: DC

## 2018-10-29 NOTE — Progress Notes (Signed)
Office: [phone]  /  Fax: [phone] TeleHealth Visit:  [name] has verbally consented to this TeleHealth visit today. The patient is located at home, the provider is located at the [facility] office. The participants in this visit include the listed provider and patient and any and all parties involved. The visit was conducted today via FaceTime.  HPI:   Chief Complaint: OBESITY [name] is here to discuss her progress with her obesity treatment plan. She is on the keep a food journal with 1400 calories and 85 grams of protein daily plan and is following her eating plan approximately 90 % of the time. She states she is doing cardio exercise and resistance exercise 60 to 90 minutes 4 times per week. [name] has lost 3 pounds and his weight is 155 pounds. She has increased exercise to break her plateau. She is doing very well overall, on the program and she is very consistent with adhering to the plan. She is meeting her protein goals consistently. We were unable to weigh the patient today for this TeleHealth visit. She feels as if she has lost weight since her last visit (weight 155 lbs 10/28/18). She has lost 29 lbs since starting treatment with [place].  Hypertension [name] is a 73 y.o. female with hypertension. HCTZ was discontinued about a month ago, due to symptomatic hypotension. She has not checked her blood pressure at home, but she reports she has not had any dizzy spells. She is working weight loss to help control her blood pressure with the goal of decreasing her risk of heart attack and stroke. [name] blood pressure is currently controlled. BP Readings from Last 3 Encounters:  09/25/18 (!) 93/39  07/16/18 118/60  04/02/18 109/64    Depression with emotional eating behaviors [name]'s depression has improved. She denies stress eating.  [name] struggles with emotional eating and using food for comfort to the extent that it is negatively impacting her health. She  often snacks when she is not hungry. [name] sometimes feels she is out of control and then feels guilty that she made poor food choices. She has been working on behavior modification techniques to help reduce her emotional eating and has been somewhat successful. She is stable on Bupropion and Duloxetine. She shows no sign of suicidal or homicidal ideations.  ASSESSMENT AND PLAN:  Essential hypertension  Other depression - with emotional eating - Plan: DULoxetine (CYMBALTA) 60 MG capsule, buPROPion (WELLBUTRIN SR) 200 MG 12 hr tablet  Class 1 obesity with body mass index (BMI) of 31.0 to 31.9 in adult, unspecified obesity type, unspecified whether serious comorbidity present  PLAN:  Hypertension We discussed sodium restriction, working on healthy weight loss, and a regular exercise program as the means to achieve improved blood pressure control. [name] agreed with this plan and agreed to follow up as directed. We will continue to monitor her blood pressure as well as her progress with the above lifestyle modifications. She agrees to continue Valsartan 40 mg daily (no prescription needed) and she will watch for signs of hypotension as she continues her lifestyle modifications.  Depression with Emotional Eating Behaviors We discussed behavior modification techniques today to help [name] deal with her emotional eating and depression. She has agreed to continue Bupropion SR 200 mg qAM #30 with no refills and Duloxetine 60 mg daily #90 with no refills and follow up as directed.  Obesity [name] is currently in the action stage of change. As such, her goal is to  continue with weight loss efforts She has agreed to keep a food journal with 1300 to 1400 calories and 85 grams of protein daily [name] will continue current exercise regimen for weight loss and overall health benefits.  We discussed the following Behavioral Modification Strategies today: planning for success and keep a strict food journal  [name] has  agreed to follow up with our clinic in 2 weeks. She was informed of the importance of frequent follow up visits to maximize her success with intensive lifestyle modifications for her multiple health conditions.  ALLERGIES: Allergies  Allergen Reactions  . Hydrocodone Nausea And Vomiting     nausea and vomiting and headaches  . Amoxicillin Hives  . Cephalexin      Neuropathy  . Codeine     Crazy in the head, bp drops  . Levofloxacin Itching  . Lyrica [Pregabalin] Other (See Comments)    "Makes me like a zombie. I'm awake but I'm in slow motion."  . Meperidine Hcl     B/P drops  . Morphine And Related      States" extreme migraines"  . Oxycodone-Acetaminophen Other (See Comments)    Crazy in head, drop in bp  . Oxycodone-Acetaminophen Other (See Comments)  . Sulfonamide Derivatives Nausea And Vomiting    Stomach upset  . Valsartan-Hydrochlorothiazide Other (See Comments)    Headaches  . Adhesive [Tape] Rash    Latex in adhesive tape. Please use paper tape  . Erythromycin Diarrhea and Rash    Other reaction(s): GI Intolerance  . Penicillins Nausea And Vomiting and Rash    MEDICATIONS: Current Outpatient Medications on File Prior to Visit  Medication Sig Dispense Refill  . ALPRAZolam (XANAX) 0.25 MG tablet Take 1 tablet (0.25 mg total) by mouth daily as needed. For anxiety 30 tablet 3  . aspirin 81 MG tablet Take 81 mg by mouth daily.    . hydrOXYzine (ATARAX/VISTARIL) 10 MG tablet Take 1-2 tablets (10-20 mg total) by mouth 3 (three) times daily as needed for itching. 30 tablet 1  . levocetirizine (XYZAL) 5 MG tablet Take 5 mg by mouth daily.     . methylphenidate (RITALIN) 10 MG tablet Take 1 tablet (10 mg total) by mouth as needed. 60 tablet 0  . MYRBETRIQ 50 MG TB24 tablet daily.    . NON FORMULARY CPAP- set on 12 [employer]. 6/24 pt is currently with [facility]    . omeprazole (PRILOSEC) 20 MG capsule Take 20 mg by mouth daily.    . rosuvastatin (CRESTOR) 20 MG tablet Take 1  tablet (20 mg total) by mouth daily. 90 tablet 1  . traZODone (DESYREL) 100 MG tablet TAKE 1/2 TO 1 (ONE-HALF TO ONE) TABLET BY MOUTH ONCE DAILY AS NEEDED FOR SLEEP 90 tablet 2  . valsartan (DIOVAN) 40 MG tablet Take 1 tablet (40 mg total) by mouth daily. 30 tablet 0  . Vitamin D, Ergocalciferol, (DRISDOL) 1.25 MG (50000 UT) CAPS capsule Take 1 capsule (50,000 Units total) by mouth every 3 (three) days. 30 capsule 0   No current facility-administered medications on file prior to visit.     PAST MEDICAL HISTORY: Past Medical History:  Diagnosis Date  . Allergy    rhinitis  . Anemia 02-01-12   iron absorption problem  . Arthritis 02-01-12   osteoarthritis, Back pain, spine surgeries ? retain hardware cervial and  lumbar.  . Asthma   . Back pain   . Cancer ([place]) 02-01-12   squamous cell -face  . Depression with  anxiety 01/08/2011  . Diabetes ([place])   . Dyspnea   . Esophageal reflux   . Fibromyalgia   . Gallbladder problem   . Generalized headaches   . Heart murmur   . HLD (hyperlipidemia)   . HTN (hypertension)   . Hyperparathyroidism ([place]) 08/18/2014  . Joint pain   . Knee pain, right 09/20/2011  . Motion sickness 12/24/2011  . Nausea & vomiting 12/24/2011  . Nerve pain   . Obesity   . OSA (obstructive sleep apnea)   . Osteoarthritis   . Osteopenia 08/05/2014  . Panic attacks   . Pelvic floor dysfunction 09/02/2016  . Sleep apnea, obstructive   . Stress incontinence   . Varicose veins of lower limb 10/02/2011  . Vasovagal syncope 11/22/2017  . Vitamin D deficiency     PAST SURGICAL HISTORY: Past Surgical History:  Procedure Laterality Date  . ABDOMINAL SURGERY  02-01-12   ABDOMINAL SURGERY  . APPENDECTOMY  1962  . BARIATRIC SURGERY    . BLADDER SUSPENSION     complicated by bowel nick/ clolostomy  . BLEPHAROPLASTY Bilateral   . CARPAL TUNNEL RELEASE  02-01-12   right  . CATARACT EXTRACTION Bilateral   . CERVICAL DISC SURGERY     C-7 in 2004, C-4 and C-5 in 2010  .  colostomy bag     Confirm with patient. Listed under medical conditions on form dated 07/18/09.  [name] COLOSTOMY REVERSAL  02-01-12  . EYE SURGERY     b/l cataracts and b/l blepharplasty  . [place]  . GASTRIC BYPASS  2003   Patient also noted "gastric bypass resection - 2004"  . HERNIA REPAIR  2009   Two hernias and abdominal reconstruction  . ivc filter     prophyllactically- no hx DVT  . LUMBAR DISC SURGERY     L1-L3 all had surgery most recently in 2017  . LUMBAR DISC SURGERY     L3-L4, Dr. Trenton Gammon  . pannilectomy     Per medical history form dated 07/18/09.  [name] TOTAL KNEE ARTHROPLASTY  02/11/2012   Procedure: TOTAL KNEE ARTHROPLASTY;  Surgeon: Mauri Pole, MD;  Location: [facility];  Service: Orthopedics;  Laterality: Right;  . ULNAR NERVE REPAIR Left 07/18/2016   by Dr.Henry Pool  . VULVA SURGERY  02-01-12   cyst removal-pt 8 months pregnant    SOCIAL HISTORY: Social History   Tobacco Use  . Smoking status: Never Smoker  . Smokeless tobacco: Never Used  Substance Use Topics  . Alcohol use: No    Alcohol/week: 0.0 standard drinks    Comment: special occasions  . Drug use: No    FAMILY HISTORY: Family History  Problem Relation Age of Onset  . Alcohol abuse Mother   . Other Mother        accidental med overdose  . Emphysema Mother        smoked  . Bipolar disorder Mother   . Sudden death Mother   . Liver disease Mother   . Other Father        CHF  . Diabetes Father   . Cancer Father        throat ca/ smoked  . Alcohol abuse Father   . Stroke Father   . Hypertension Father   . Liver disease Father   . Hypertension Brother   . Hyperlipidemia Brother   . Obesity Brother   . Heart attack Maternal Grandfather   . Heart attack Paternal Grandmother   .  Heart attack Paternal Grandfather   . Arthritis Son        psoriatic  . Arthritis Son        psoriatic  . Obesity Son     ROS: Review of Systems  Constitutional: Positive for weight loss.   Neurological: Negative for dizziness.  Psychiatric/Behavioral: Positive for depression. Negative for suicidal ideas.    PHYSICAL EXAM: Pt in no acute distress  RECENT LABS AND TESTS: BMET    Component Value Date/Time   NA 139 09/25/2018 0950   K 4.1 09/25/2018 0950   CL 102 09/25/2018 0950   CO2 25 09/25/2018 0950   GLUCOSE 82 09/25/2018 0950   GLUCOSE 105 (H) 07/10/2017 1835   BUN 23 09/25/2018 0950   CREATININE 0.82 09/25/2018 0950   CREATININE 0.83 11/28/2012 1153   CALCIUM 8.9 09/25/2018 0950   GFRNONAA 72 09/25/2018 0950   GFRAA 83 09/25/2018 0950   Lab Results  Component Value Date   HGBA1C 5.2 09/25/2018   HGBA1C 5.7 (H) 01/29/2018   HGBA1C 5.9 07/03/2017   HGBA1C 5.9 09/06/2016   HGBA1C 5.8 03/14/2016   Lab Results  Component Value Date   INSULIN 3.5 09/25/2018   INSULIN 4.3 01/29/2018   CBC    Component Value Date/Time   WBC 5.5 09/25/2018 0950   WBC 13.0 (H) 07/10/2017 1835   RBC 3.87 09/25/2018 0950   RBC 3.88 07/10/2017 1835   HGB 12.1 09/25/2018 0950   HGB 12.4 02/19/2011 1425   HCT 38.0 09/25/2018 0950   HCT 37.1 02/19/2011 1425   PLT 201 09/25/2018 0950   MCV 98 (H) 09/25/2018 0950   MCV 93.7 02/19/2011 1425   MCH 31.3 09/25/2018 0950   MCH 28.6 07/10/2017 1835   MCHC 31.8 09/25/2018 0950   MCHC 32.2 07/10/2017 1835   RDW 14.1 09/25/2018 0950   RDW 13.7 02/19/2011 1425   LYMPHSABS 1.6 09/25/2018 0950   LYMPHSABS 1.1 02/19/2011 1425   MONOABS 1.1 (H) 07/10/2017 1835   MONOABS 0.5 02/19/2011 1425   EOSABS 0.4 09/25/2018 0950   BASOSABS 0.0 09/25/2018 0950   BASOSABS 0.0 02/19/2011 1425   Iron/TIBC/Ferritin/ %Sat    Component Value Date/Time   IRON 71 09/25/2018 0950   TIBC 314 09/25/2018 0950   FERRITIN 29 09/25/2018 0950   IRONPCTSAT 23 09/25/2018 0950   IRONPCTSAT 17 (L) 02/19/2011 1425   Lipid Panel     Component Value Date/Time   CHOL 125 09/25/2018 0950   TRIG 66 09/25/2018 0950   HDL 57 09/25/2018 0950   CHOLHDL 2  07/03/2017 1550   VLDL 13.2 07/03/2017 1550   LDLCALC 54 09/25/2018 0950   Hepatic Function Panel     Component Value Date/Time   PROT 6.1 09/25/2018 0950   ALBUMIN 4.2 09/25/2018 0950   AST 26 09/25/2018 0950   ALT 20 09/25/2018 0950   ALKPHOS 72 09/25/2018 0950   BILITOT 0.3 09/25/2018 0950   BILIDIR 0.1 06/01/2013 0745   IBILI 0.6 02/02/2013 0850      Component Value Date/Time   TSH 2.160 09/25/2018 0950   TSH 1.900 01/29/2018 1001   TSH 2.99 09/06/2016 0751     Ref. Range 09/25/2018 09:50  Vitamin D, 25-Hydroxy Latest Ref Range: 30.0 - 100.0 ng/mL 38.3    I, Doreene Nest, am acting as Location manager for [employer], FNP-C  I have reviewed the above documentation for accuracy and completeness, and I agree with the above.  - Torrey Ballinas, FNP-C.

## 2018-10-31 ENCOUNTER — Other Ambulatory Visit (INDEPENDENT_AMBULATORY_CARE_PROVIDER_SITE_OTHER): Payer: Self-pay | Admitting: Family Medicine

## 2018-10-31 DIAGNOSIS — F3289 Other specified depressive episodes: Secondary | ICD-10-CM

## 2018-11-10 ENCOUNTER — Other Ambulatory Visit: Payer: Self-pay

## 2018-11-10 DIAGNOSIS — Z961 Presence of intraocular lens: Secondary | ICD-10-CM | POA: Diagnosis not present

## 2018-11-10 DIAGNOSIS — H353131 Nonexudative age-related macular degeneration, bilateral, early dry stage: Secondary | ICD-10-CM | POA: Diagnosis not present

## 2018-11-10 NOTE — Progress Notes (Signed)
Virtual Visit via Video Note  I connected with patient on 11/11/18 at  1:45 PM EDT by audio enabled telemedicine application and verified that I am speaking with the correct person using two identifiers.   THIS ENCOUNTER IS A VIRTUAL VISIT DUE TO COVID-19 - PATIENT WAS NOT SEEN IN THE OFFICE. PATIENT HAS CONSENTED TO VIRTUAL VISIT / TELEMEDICINE VISIT   Location of patient: home  Location of provider: office  I discussed the limitations of evaluation and management by telemedicine and the availability of in person appointments. The patient expressed understanding and agreed to proceed.   Subjective:   [name] is a 73 y.o. female who presents for Medicare Annual (Subsequent) preventive examination.  Review of Systems:  Cardiac Risk Factors include: advanced age (>70men, >18 women);hypertension;diabetes mellitus Home Safety/Smoke Alarms: Feels safe in home. Smoke alarms in place.  Lives w/ husband in 1 story home. Wears CPAP at night. Often stays at beach house in [place], [place]  Female:    Mammo-  09/25/18     Dexa scan- ordered       CCS- due  2021 Eye-Dr.Mincey 11/10/18  Objective:     Vitals: see PCP visit from this morning   Advanced Directives 11/11/2018 11/08/2017 08/23/2016 08/22/2015 09/07/2014 08/05/2014 02/11/2012  Does Patient Have a Medical Advance Directive? Yes Yes Yes Yes Yes Yes Patient has advance directive, copy not in chart  Type of Advance Directive [place];Living will [place];Living will [place];Living will [place];Living will [place];Living will [place];Living will Living will  Does patient want to make changes to medical advance directive? No - Patient declined - - - - No - Patient declined -  Copy of [place] in Chart? Yes - validated most recent copy scanned in chart (See row information) No - copy requested No -  copy requested Yes - No - copy requested -  Pre-existing out of facility DNR order (yellow form or pink MOST form) - - - - - - No    Tobacco Social History   Tobacco Use  Smoking Status Never Smoker  Smokeless Tobacco Never Used     Counseling given: Not Answered   Clinical Intake:     Pain : No/denies pain                 Past Medical History:  Diagnosis Date  . Allergy    rhinitis  . Anemia 02-01-12   iron absorption problem  . Arthritis 02-01-12   osteoarthritis, Back pain, spine surgeries ? retain hardware cervial and  lumbar.  . Asthma   . Back pain   . Cancer (Between) 02-01-12   squamous cell -face  . Depression with anxiety 01/08/2011  . Diabetes ([employer])   . Dyspnea   . Esophageal reflux   . Fibromyalgia   . Gallbladder problem   . Generalized headaches   . Heart murmur   . HLD (hyperlipidemia)   . HTN (hypertension)   . Hyperparathyroidism ([place]) 08/18/2014  . Joint pain   . Knee pain, right 09/20/2011  . Motion sickness 12/24/2011  . Nausea & vomiting 12/24/2011  . Nerve pain   . Obesity   . OSA (obstructive sleep apnea)   . Osteoarthritis   . Osteopenia 08/05/2014  . Panic attacks   . Pelvic floor dysfunction 09/02/2016  . Sleep apnea, obstructive   . Stress incontinence   . Varicose veins of  lower limb 10/02/2011  . Vasovagal syncope 11/22/2017  . Vitamin D deficiency    Past Surgical History:  Procedure Laterality Date  . ABDOMINAL SURGERY  02-01-12   ABDOMINAL SURGERY  . APPENDECTOMY  1962  . BARIATRIC SURGERY    . BLADDER SUSPENSION     complicated by bowel nick/ clolostomy  . BLEPHAROPLASTY Bilateral   . CARPAL TUNNEL RELEASE  02-01-12   right  . CATARACT EXTRACTION Bilateral   . CERVICAL DISC SURGERY     C-7 in 2004, C-4 and C-5 in 2010  . colostomy bag     Confirm with patient. Listed under medical conditions on form dated 07/18/09.  [name] COLOSTOMY REVERSAL  02-01-12  . EYE SURGERY     b/l cataracts and b/l blepharplasty  . [place]  . GASTRIC BYPASS  2003   Patient also noted "gastric bypass resection - 2004"  . HERNIA REPAIR  2009   Two hernias and abdominal reconstruction  . ivc filter     prophyllactically- no hx DVT  . LUMBAR DISC SURGERY     L1-L3 all had surgery most recently in 2017  . LUMBAR DISC SURGERY     L3-L4, Dr. Trenton Gammon  . pannilectomy     Per medical history form dated 07/18/09.  [name] TOTAL KNEE ARTHROPLASTY  02/11/2012   Procedure: TOTAL KNEE ARTHROPLASTY;  Surgeon: Mauri Pole, MD;  Location: [facility];  Service: Orthopedics;  Laterality: Right;  . ULNAR NERVE REPAIR Left 07/18/2016   by Dr.Henry Pool  . VULVA SURGERY  02-01-12   cyst removal-pt 8 months pregnant   Family History  Problem Relation Age of Onset  . Alcohol abuse Mother   . Other Mother        accidental med overdose  . Emphysema Mother        smoked  . Bipolar disorder Mother   . Sudden death Mother   . Liver disease Mother   . Other Father        CHF  . Diabetes Father   . Cancer Father        throat ca/ smoked  . Alcohol abuse Father   . Stroke Father   . Hypertension Father   . Liver disease Father   . Hypertension Brother   . Hyperlipidemia Brother   . Obesity Brother   . Heart attack Maternal Grandfather   . Heart attack Paternal Grandmother   . Heart attack Paternal Grandfather   . Arthritis Son        psoriatic  . Arthritis Son        psoriatic  . Obesity Son    Social History   Socioeconomic History  . Marital status: Married    Spouse name: Tyiesha Filho  . Number of children: Not on file  . Years of education: Not on file  . Highest education level: Not on file  Occupational History  . Occupation: Retired  Scientific laboratory technician  . Financial resource strain: Not on file  . Food insecurity    Worry: Not on file    Inability: Not on file  . Transportation needs    Medical: Not on file    Non-medical: Not on file  Tobacco Use  . Smoking status: Never Smoker  . Smokeless tobacco: Never Used   Substance and Sexual Activity  . Alcohol use: No    Alcohol/week: 0.0 standard drinks    Comment: special occasions  . Drug use: No  . Sexual  activity: Never  Lifestyle  . Physical activity    Days per week: Not on file    Minutes per session: Not on file  . Stress: Not on file  Relationships  . Social Herbalist on phone: Not on file    Gets together: Not on file    Attends religious service: Not on file    Active member of club or organization: Not on file    Attends meetings of clubs or organizations: Not on file    Relationship status: Not on file  Other Topics Concern  . Not on file  Social History Narrative  . Not on file    Outpatient Encounter Medications as of 11/11/2018  Medication Sig  . ALPRAZolam (XANAX) 0.25 MG tablet Take 1 tablet (0.25 mg total) by mouth daily as needed. For anxiety  . aspirin 81 MG tablet Take 81 mg by mouth daily.  [name] buPROPion (WELLBUTRIN SR) 200 MG 12 hr tablet Take 1 tablet (200 mg total) by mouth daily.  . DULoxetine (CYMBALTA) 60 MG capsule Take 1 capsule (60 mg total) by mouth daily.  . fluticasone (FLONASE) 50 MCG/ACT nasal spray Place 2 sprays into both nostrils daily.  . hydrOXYzine (ATARAX/VISTARIL) 10 MG tablet Take 1-2 tablets (10-20 mg total) by mouth 3 (three) times daily as needed for itching.  . levocetirizine (XYZAL) 5 MG tablet Take 5 mg by mouth daily.   . methylphenidate (RITALIN) 10 MG tablet Take 1 tablet (10 mg total) by mouth as needed.  [name] MYRBETRIQ 50 MG TB24 tablet daily.  . NON FORMULARY CPAP- set on 12 [employer]. 6/24 pt is currently with [facility]  . omeprazole (PRILOSEC) 20 MG capsule Take 20 mg by mouth daily.  . propranolol ER (INDERAL LA) 60 MG 24 hr capsule Take 1 capsule (60 mg total) by mouth daily.  . rosuvastatin (CRESTOR) 20 MG tablet Take 1 tablet (20 mg total) by mouth daily.  . traZODone (DESYREL) 100 MG tablet TAKE 1/2 TO 1 (ONE-HALF TO ONE) TABLET BY MOUTH ONCE DAILY AS NEEDED FOR SLEEP  .  valsartan (DIOVAN) 40 MG tablet Take 1 tablet (40 mg total) by mouth daily.  . Vitamin D, Ergocalciferol, (DRISDOL) 1.25 MG (50000 UT) CAPS capsule Take 1 capsule (50,000 Units total) by mouth every 3 (three) days.   No facility-administered encounter medications on file as of 11/11/2018.     Activities of Daily Living In your present state of health, do you have any difficulty performing the following activities: 11/11/2018  Hearing? N  Vision? N  Difficulty concentrating or making decisions? N  Walking or climbing stairs? N  Dressing or bathing? N  Doing errands, shopping? N  Preparing Food and eating ? N  Using the Toilet? N  In the past six months, have you accidently leaked urine? N  Do you have problems with loss of bowel control? N  Managing your Medications? N  Managing your Finances? N  Housekeeping or managing your Housekeeping? N  Some recent data might be hidden    Patient Care Team: [name], DO as PCP - General (Family Medicine) Lyndal Pulley, DO as Attending Physician (Family Medicine) Deneise Lever, MD as Consulting Physician (Pulmonary Disease) [place], Jennefer Bravo, MD as Referring Physician (Dermatology) Leo Grosser Seymour Bars, MD (Inactive) as Consulting Physician (Obstetrics and Gynecology) Adrian Prows, MD as Consulting Physician (Cardiology) Alanda Slim Neena Rhymes, MD as Consulting Physician (Ophthalmology)    Assessment:   This is a routine wellness  examination for Shirla. Physical assessment deferred to PCP.  Exercise Activities and Dietary recommendations Current Exercise Habits: Home exercise routine, Type of exercise: treadmill, Time (Minutes): 45, Frequency (Times/Week): 3, Weekly Exercise (Minutes/Week): 135, Intensity: Mild, Exercise limited by: None identified Diet (meal preparation, eat out, water intake, caffeinated beverages, dairy products, fruits and vegetables): in general, a "healthy" diet  , well balanced   Goals    .  Increase physical activity       Fall Risk Fall Risk  11/11/2018 11/08/2017 08/23/2016 08/22/2015 08/05/2014  Falls in the past year? 1 Yes Yes No No  Number falls in past yr: 1 1 1  - -  Comment fell off bike twice - - - -  Injury with Fall? 0 - - - -  Follow up - Education provided;Falls prevention discussed Education provided - -   Depression Screen PHQ 2/9 Scores 11/11/2018 01/29/2018 11/08/2017 08/23/2016  PHQ - 2 Score 0 5 0 0  PHQ- 9 Score - 17 - -     Cognitive Function  Ad8 score reviewed for issues:  Issues making decisions:no  Less interest in hobbies / activities: no  Repeats questions, stories (family complaining):no  Trouble using ordinary gadgets (microwave, computer, phone):no  Forgets the month or year: no  Mismanaging finances: no  Remembering appts:no  Daily problems with thinking and/or memory:no Ad8 score is=0     MMSE - Mini Mental State Exam 11/22/2017 11/08/2017 08/23/2016 08/22/2015  Orientation to time 5 5 5 5   Orientation to Place 5 5 5 5   Registration 3 3 3 3   Attention/ Calculation 5 5 5 5   Recall 3 3 3 3   Language- name 2 objects 2 2 2 2   Language- repeat 1 1 1 1   Language- follow 3 step command 3 3 3 3   Language- read & follow direction 1 1 1 1   Write a sentence 1 1 1 1   Copy design 1 1 1 1   Total score 30 30 30 30         Immunization History  Administered Date(s) Administered  . DTaP 01/23/1968  . Influenza Split 10/24/2010, 10/16/2011, 10/25/2015, 10/22/2016  . Influenza Whole 11/08/2008, 10/22/2009  . Influenza, High Dose Seasonal PF 10/23/2014, 10/19/2017  . Influenza,inj,Quad PF,6+ Mos 11/07/2012  . Influenza-Unspecified 09/23/2018  . Pneumococcal Conjugate-13 06/08/2013  . Pneumococcal Polysaccharide-23 10/22/2009, 08/22/2015  . Tdap 10/23/2006, 11/17/2013  . Zoster 01/23/2004  . Zoster Recombinat (Shingrix) 12/26/2016, 02/28/2017   Screening Tests Health Maintenance  Topic Date Due  . HEMOGLOBIN A1C  03/25/2019  .  COLONOSCOPY  09/23/2019  . OPHTHALMOLOGY EXAM  11/10/2019  . MAMMOGRAM  09/23/2020  . TETANUS/TDAP  11/18/2023  . INFLUENZA VACCINE  Completed  . DEXA SCAN  Completed  . Hepatitis C Screening  Completed  . PNA vac Low Risk Adult  Completed  . FOOT EXAM  Discontinued      Plan:   See you next year!  I have ordered your bone density scan. Please schedule  Keep up the great work!  I have personally reviewed and noted the following in the patient's chart:   . Medical and social history . Use of alcohol, tobacco or illicit drugs  . Current medications and supplements . Functional ability and status . Nutritional status . Physical activity . Advanced directives . List of other physicians . Hospitalizations, surgeries, and ER visits in previous 12 months . Vitals . Screenings to include cognitive, depression, and falls . Referrals and appointments  In addition, I have  reviewed and discussed with patient certain preventive protocols, quality metrics, and best practice recommendations. A written personalized care plan for preventive services as well as general preventive health recommendations were provided to patient.     Shela Nevin, [place]  11/11/2018

## 2018-11-11 ENCOUNTER — Other Ambulatory Visit: Payer: Self-pay

## 2018-11-11 ENCOUNTER — Ambulatory Visit (INDEPENDENT_AMBULATORY_CARE_PROVIDER_SITE_OTHER): Payer: Medicare Other | Admitting: *Deleted

## 2018-11-11 ENCOUNTER — Ambulatory Visit (INDEPENDENT_AMBULATORY_CARE_PROVIDER_SITE_OTHER): Payer: Medicare Other | Admitting: Family Medicine

## 2018-11-11 ENCOUNTER — Encounter: Payer: Self-pay | Admitting: *Deleted

## 2018-11-11 ENCOUNTER — Encounter: Payer: Self-pay | Admitting: Family Medicine

## 2018-11-11 ENCOUNTER — Ambulatory Visit: Payer: Medicare Other | Admitting: *Deleted

## 2018-11-11 VITALS — BP 120/76 | HR 59 | Temp 96.8°F | Ht 60.0 in | Wt 157.5 lb

## 2018-11-11 DIAGNOSIS — R251 Tremor, unspecified: Secondary | ICD-10-CM | POA: Diagnosis not present

## 2018-11-11 DIAGNOSIS — H6981 Other specified disorders of Eustachian tube, right ear: Secondary | ICD-10-CM | POA: Diagnosis not present

## 2018-11-11 DIAGNOSIS — F418 Other specified anxiety disorders: Secondary | ICD-10-CM | POA: Diagnosis not present

## 2018-11-11 DIAGNOSIS — Z780 Asymptomatic menopausal state: Secondary | ICD-10-CM | POA: Diagnosis not present

## 2018-11-11 DIAGNOSIS — Z0000 Encounter for general adult medical examination without abnormal findings: Secondary | ICD-10-CM

## 2018-11-11 MED ORDER — FLUTICASONE PROPIONATE 50 MCG/ACT NA SUSP: 2.0000 | Freq: Every day | NASAL | 2 refills | Status: DC

## 2018-11-11 MED ORDER — PROPRANOLOL HCL ER 60 MG PO CP24: 60.0000 mg | ORAL_CAPSULE | Freq: Every day | ORAL | 2 refills | Status: DC

## 2018-11-11 NOTE — Patient Instructions (Signed)
See you next year!  I have ordered your bone density scan. Please schedule  Keep up the great work!   [name] , Thank you for taking time to come for your Medicare Wellness Visit. I appreciate your ongoing commitment to your health goals. Please review the following plan we discussed and let me know if I can assist you in the future.   These are the goals we discussed: Goals    . Increase physical activity       This is a list of the screening recommended for you and due dates:  Health Maintenance  Topic Date Due  . Hemoglobin A1C  03/25/2019  . Colon Cancer Screening  09/23/2019  . Eye exam for diabetics  11/10/2019  . Mammogram  09/23/2020  . Tetanus Vaccine  11/18/2023  . Flu Shot  Completed  . DEXA scan (bone density measurement)  Completed  .  Hepatitis C: One time screening is recommended by Center for Disease Control  (CDC) for  adults born from [birth year] through 1963.   Completed  . Pneumonia vaccines  Completed  . Complete foot exam   Discontinued    Preventive Care 71 Years and Older, Female Preventive care refers to lifestyle choices and visits with your health care provider that can promote health and wellness. This includes:  A yearly physical exam. This is also called an annual well check.  Regular dental and eye exams.  Immunizations.  Screening for certain conditions.  Healthy lifestyle choices, such as diet and exercise. What can I expect for my preventive care visit? Physical exam Your health care provider will check:  Height and weight. These may be used to calculate body mass index (BMI), which is a measurement that tells if you are at a healthy weight.  Heart rate and blood pressure.  Your skin for abnormal spots. Counseling Your health care provider may ask you questions about:  Alcohol, tobacco, and drug use.  Emotional well-being.  Home and relationship well-being.  Sexual activity.  Eating habits.  History of falls.  Memory and  ability to understand (cognition).  Work and work Statistician.  Pregnancy and menstrual history. What immunizations do I need?  Influenza (flu) vaccine  This is recommended every year. Tetanus, diphtheria, and pertussis (Tdap) vaccine  You may need a Td booster every 10 years. Varicella (chickenpox) vaccine  You may need this vaccine if you have not already been vaccinated. Zoster (shingles) vaccine  You may need this after age 40. Pneumococcal conjugate (PCV13) vaccine  One dose is recommended after age 71. Pneumococcal polysaccharide (PPSV23) vaccine  One dose is recommended after age 71. Measles, mumps, and rubella (MMR) vaccine  You may need at least one dose of MMR if you were born in 1957 or later. You may also need a second dose. Meningococcal conjugate (MenACWY) vaccine  You may need this if you have certain conditions. Hepatitis A vaccine  You may need this if you have certain conditions or if you travel or work in places where you may be exposed to hepatitis A. Hepatitis B vaccine  You may need this if you have certain conditions or if you travel or work in places where you may be exposed to hepatitis B. Haemophilus influenzae type b (Hib) vaccine  You may need this if you have certain conditions. You may receive vaccines as individual doses or as more than one vaccine together in one shot (combination vaccines). Talk with your health care provider about the risks and  benefits of combination vaccines. What tests do I need? Blood tests  Lipid and cholesterol levels. These may be checked every 5 years, or more frequently depending on your overall health.  Hepatitis C test.  Hepatitis B test. Screening  Lung cancer screening. You may have this screening every year starting at age 71 if you have a 30-pack-year history of smoking and currently smoke or have quit within the past 15 years.  Colorectal cancer screening. All adults should have this screening  starting at age 71 and continuing until age 92. Your health care provider may recommend screening at age 80 if you are at increased risk. You will have tests every 1-10 years, depending on your results and the type of screening test.  Diabetes screening. This is done by checking your blood sugar (glucose) after you have not eaten for a while (fasting). You may have this done every 1-3 years.  Mammogram. This may be done every 1-2 years. Talk with your health care provider about how often you should have regular mammograms.  BRCA-related cancer screening. This may be done if you have a family history of breast, ovarian, tubal, or peritoneal cancers. Other tests  Sexually transmitted disease (STD) testing.  Bone density scan. This is done to screen for osteoporosis. You may have this done starting at age 88. Follow these instructions at home: Eating and drinking  Eat a diet that includes fresh fruits and vegetables, whole grains, lean protein, and low-fat dairy products. Limit your intake of foods with high amounts of sugar, saturated fats, and salt.  Take vitamin and mineral supplements as recommended by your health care provider.  Do not drink alcohol if your health care provider tells you not to drink.  If you drink alcohol: ? Limit how much you have to 0-1 drink a day. ? Be aware of how much alcohol is in your drink. In the [place] one drink equals one 12 oz bottle of beer (355 mL), one 5 oz glass of wine (148 mL), or one 1 oz glass of hard liquor (44 mL). Lifestyle  Take daily care of your teeth and gums.  Stay active. Exercise for at least 30 minutes on 5 or more days each week.  Do not use any products that contain nicotine or tobacco, such as cigarettes, e-cigarettes, and chewing tobacco. If you need help quitting, ask your health care provider.  If you are sexually active, practice safe sex. Use a condom or other form of protection in order to prevent STIs (sexually transmitted  infections).  Talk with your health care provider about taking a low-dose aspirin or statin. What's next?  Go to your health care provider once a year for a well check visit.  Ask your health care provider how often you should have your eyes and teeth checked.  Stay up to date on all vaccines. This information is not intended to replace advice given to you by your health care provider. Make sure you discuss any questions you have with your health care provider. Document Released: 02/04/2015 Document Revised: 01/02/2018 Document Reviewed: 01/02/2018 Elsevier Patient Education  2020 [employer].

## 2018-11-11 NOTE — Patient Instructions (Addendum)
Keep the diet clean and stay active.  Let us know if you need refills.  Call Dr Jannifer Franklin' office for an appointment in around 1 month. If you can't get in, see me instead.  Flonase is also over the counter.   Let us know if you need anything.

## 2018-11-11 NOTE — Progress Notes (Signed)
Chief Complaint  Patient presents with  . Follow-up  . Ear Pain    right ear    Subjective [name] presents for f/u anxiety/depression.  She is currently being treated with Cymbalta 60 mg/d, Wellbutrin 200 mg daily.  Reports doing well since treatment. No thoughts of harming self or others. No self-medication with alcohol, prescription drugs or illicit drugs.  R hand shaking Has gotten worse over past year. Sees Neuro, it was not worrisome when she brought it up with him. Some shaking in L. No injury or headaches. Affecting ADL's.  R ear feels like it clogs. Radiates to jaw. Also is aching.  No injury or change in activity.  She is compliant with her Xyzal.  ROS Psych: No homicidal or suicidal thoughts HEENT: +ear pain  Past Medical History:  Diagnosis Date  . Allergy    rhinitis  . Anemia 02-01-12   iron absorption problem  . Arthritis 02-01-12   osteoarthritis, Back pain, spine surgeries ? retain hardware cervial and  lumbar.  . Asthma   . Back pain   . Cancer ([place]) 02-01-12   squamous cell -face  . Depression with anxiety 01/08/2011  . Diabetes ([place])   . Dyspnea   . Esophageal reflux   . Fibromyalgia   . Gallbladder problem   . Generalized headaches   . Heart murmur   . HLD (hyperlipidemia)   . HTN (hypertension)   . Hyperparathyroidism ([place]) 08/18/2014  . Joint pain   . Knee pain, right 09/20/2011  . Motion sickness 12/24/2011  . Nausea & vomiting 12/24/2011  . Nerve pain   . Obesity   . OSA (obstructive sleep apnea)   . Osteoarthritis   . Osteopenia 08/05/2014  . Panic attacks   . Pelvic floor dysfunction 09/02/2016  . Sleep apnea, obstructive   . Stress incontinence   . Varicose veins of lower limb 10/02/2011  . Vasovagal syncope 11/22/2017  . Vitamin D deficiency    Allergies as of 11/11/2018      Reactions   Hydrocodone Nausea And Vomiting    nausea and vomiting and headaches   Amoxicillin Hives   Cephalexin     Neuropathy   Codeine    Crazy  in the head, bp drops   Levofloxacin Itching   Lyrica [pregabalin] Other (See Comments)   "Makes me like a zombie. I'm awake but I'm in slow motion."   Meperidine Hcl    B/P drops   Morphine And Related     States" extreme migraines"   Oxycodone-acetaminophen Other (See Comments)   Crazy in head, drop in bp   Oxycodone-acetaminophen Other (See Comments)   Sulfonamide Derivatives Nausea And Vomiting   Stomach upset   Valsartan-hydrochlorothiazide Other (See Comments)   Headaches   Adhesive [tape] Rash   Latex in adhesive tape. Please use paper tape   Erythromycin Diarrhea, Rash   Other reaction(s): GI Intolerance   Penicillins Nausea And Vomiting, Rash      Medication List       Accurate as of November 11, 2018 12:22 PM. If you have any questions, ask your nurse or doctor.        ALPRAZolam 0.25 MG tablet Commonly known as: XANAX Take 1 tablet (0.25 mg total) by mouth daily as needed. For anxiety   aspirin 81 MG tablet Take 81 mg by mouth daily.   buPROPion 200 MG 12 hr tablet Commonly known as: WELLBUTRIN SR Take 1 tablet (200 mg total) by mouth daily.  DULoxetine 60 MG capsule Commonly known as: CYMBALTA Take 1 capsule (60 mg total) by mouth daily.   fluticasone 50 MCG/ACT nasal spray Commonly known as: FLONASE Place 2 sprays into both nostrils daily. Started by: [name], DO   hydrOXYzine 10 MG tablet Commonly known as: ATARAX/VISTARIL Take 1-2 tablets (10-20 mg total) by mouth 3 (three) times daily as needed for itching.   methylphenidate 10 MG tablet Commonly known as: RITALIN Take 1 tablet (10 mg total) by mouth as needed.   Myrbetriq 50 MG Tb24 tablet Generic drug: mirabegron ER daily.   NON FORMULARY CPAP- set on 12 [employer]. 6/24 pt is currently with [facility]   omeprazole 20 MG capsule Commonly known as: PRILOSEC Take 20 mg by mouth daily.   propranolol ER 60 MG 24 hr capsule Commonly known as: INDERAL LA Take 1 capsule (60 mg  total) by mouth daily. Started by: [name], DO   rosuvastatin 20 MG tablet Commonly known as: CRESTOR Take 1 tablet (20 mg total) by mouth daily.   traZODone 100 MG tablet Commonly known as: DESYREL TAKE 1/2 TO 1 (ONE-HALF TO ONE) TABLET BY MOUTH ONCE DAILY AS NEEDED FOR SLEEP   valsartan 40 MG tablet Commonly known as: Diovan Take 1 tablet (40 mg total) by mouth daily.   Vitamin D (Ergocalciferol) 1.25 MG (50000 UT) Caps capsule Commonly known as: DRISDOL Take 1 capsule (50,000 Units total) by mouth every 3 (three) days.   Xyzal 5 MG tablet Generic drug: levocetirizine Take 5 mg by mouth daily.       Exam BP 120/76 (BP Location: Left Arm, Patient Position: Sitting, Cuff Size: Normal)   Pulse (!) 59   Temp (!) 96.8 F (36 C) (Temporal)   Ht 5' (1.524 m)   Wt 157 lb 8 oz (71.4 kg)   SpO2 97%   BMI 30.76 kg/m  General:  well developed, well nourished, in no apparent distress HEENT: Ears are negative bilaterally Lungs:  clear to auscultation, breath sounds equal bilaterally, no respiratory distress Cardio:  regular rate and rhythm with a systolic murmur, no lower extremity edema Neuro: Resting tremor noted to be greater on the right than the left, there is also cogwheel rigidity when testing strength on the right; I do not appreciate tremor with movement during the exam Psych: well oriented with normal range of affect and age-appropriate judgement/insight, alert and oriented x4.  Assessment and Plan  Tremor - Plan: propranolol ER (INDERAL LA) 60 MG 24 hr capsule  Dysfunction of right eustachian tube - Plan: fluticasone (FLONASE) 50 MCG/ACT nasal spray  Depression with anxiety  1-start propranolol.  She was told she had a history of benign essential tremor so we will treat this first.  I would like her to schedule an appointment with the neurology team in 1 month if this does not improve things as I do have concern for Parkinson's disease given her exam.  2-start intranasal corticosteroid, continue Xyzal. 3-continue medications F/u in 6 months. The patient voiced understanding and agreement to the plan.  [place], DO 11/11/18 12:22 PM

## 2018-11-12 ENCOUNTER — Encounter (INDEPENDENT_AMBULATORY_CARE_PROVIDER_SITE_OTHER): Payer: Self-pay | Admitting: Family Medicine

## 2018-11-12 ENCOUNTER — Ambulatory Visit (INDEPENDENT_AMBULATORY_CARE_PROVIDER_SITE_OTHER): Payer: Medicare Other | Admitting: Family Medicine

## 2018-11-12 ENCOUNTER — Other Ambulatory Visit: Payer: Self-pay

## 2018-11-12 VITALS — BP 108/51 | HR 54 | Temp 98.7°F | Ht 59.0 in | Wt 155.0 lb

## 2018-11-12 DIAGNOSIS — E669 Obesity, unspecified: Secondary | ICD-10-CM

## 2018-11-12 DIAGNOSIS — E559 Vitamin D deficiency, unspecified: Secondary | ICD-10-CM

## 2018-11-12 DIAGNOSIS — Z6831 Body mass index (BMI) 31.0-31.9, adult: Secondary | ICD-10-CM | POA: Diagnosis not present

## 2018-11-12 DIAGNOSIS — F3289 Other specified depressive episodes: Secondary | ICD-10-CM

## 2018-11-12 MED ORDER — VITAMIN D (ERGOCALCIFEROL) 1.25 MG (50000 UNIT) PO CAPS: 50000.0000 [IU] | ORAL_CAPSULE | ORAL | 0 refills | Status: DC

## 2018-11-12 MED ORDER — BUPROPION HCL ER (SR) 200 MG PO TB12: 200.0000 mg | ORAL_TABLET | Freq: Every day | ORAL | 0 refills | Status: DC

## 2018-11-17 NOTE — Progress Notes (Signed)
Office: [phone]  /  Fax: [phone]   HPI:   Chief Complaint: OBESITY [name] is here to discuss her progress with her obesity treatment plan. She is on the keep a food journal with 1300 calories and 85 grams of protein daily plan and she is following her eating plan approximately 80 % of the time. She states she is riding the stationary bike 60 minutes 6 times per week. [name] has eaten out with the some friends recently, but she has overall done well on the plan. She is journaling consistently and she is getting 85 grams of protein daily. Her weight is 155 lb (70.3 kg) today and she has had a weight loss of 2 pounds since her last in-office visit. She has lost 31 lbs since starting treatment with [place].  Vitamin D deficiency [name] has a diagnosis of vitamin D deficiency. Her last vitamin D level was low at 38.3 on 09/25/18. 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 She is currently taking vit D and denies nausea, vomiting or muscle weakness.  Depression with emotional eating behaviors [name] very occasionally struggles with stress eating. It is well controlled at this point..  She has been working on behavior modification techniques to help reduce her emotional eating and has been successful. She shows no sign of suicidal or homicidal ideations.  ASSESSMENT AND PLAN:  Vitamin D deficiency - Plan: Vitamin D, Ergocalciferol, (DRISDOL) 1.25 MG (50000 UT) CAPS capsule  Other depression - with emotional eating - Plan: buPROPion (WELLBUTRIN SR) 200 MG 12 hr tablet  Class 1 obesity with body mass index (BMI) of 31.0 to 31.9 in adult, unspecified obesity type, unspecified whether serious comorbidity present  PLAN:  Vitamin D Deficiency [name] was informed that low vitamin D levels contributes to fatigue and are associated with obesity, breast, and colon cancer. [name] agrees to take prescription Vit D @50 ,000 IU every 3 days #10 with no refills and she will  follow up for routine testing of vitamin D, at least 2-3 times per year. She was informed of the risk of over-replacement of vitamin D and agrees to not increase her dose unless she discusses this with [place] first. [name] agrees to follow up with our clinic in 3 weeks.  Depression with Emotional Eating Behaviors We discussed behavior modification techniques today to help [name] deal with her emotional eating and depression. She has agreed to take Bupropion 200 mg daily #30 with no refills and follow up as directed.  Obesity [name] is currently in the action stage of change. As such, her goal is to continue with weight loss efforts She has agreed to keep a food journal with 1300 to 1400 calories and 85 grams of protein daily [name] will continue current exercise regimen for weight loss and overall health benefits.  We discussed the following Behavioral Modification Strategies today: planning for success, keep a strict food journal and decrease eating out  [name] has agreed to follow up with our clinic in 3 weeks. She was informed of the importance of frequent follow up visits to maximize her success with intensive lifestyle modifications for her multiple health conditions.  ALLERGIES: Allergies  Allergen Reactions  . Hydrocodone Nausea And Vomiting     nausea and vomiting and headaches  . Amoxicillin Hives  . Cephalexin      Neuropathy  . Codeine     Crazy in the head, bp drops  . Levofloxacin Itching  . Lyrica [Pregabalin] Other (See  Comments)    "Makes me like a zombie. I'm awake but I'm in slow motion."  . Meperidine Hcl     B/P drops  . Morphine And Related      States" extreme migraines"  . Oxycodone-Acetaminophen Other (See Comments)    Crazy in head, drop in bp  . Oxycodone-Acetaminophen Other (See Comments)  . Sulfonamide Derivatives Nausea And Vomiting    Stomach upset  . Valsartan-Hydrochlorothiazide Other (See Comments)    Headaches  . Adhesive [Tape] Rash    Latex in adhesive  tape. Please use paper tape  . Erythromycin Diarrhea and Rash    Other reaction(s): GI Intolerance  . Penicillins Nausea And Vomiting and Rash    MEDICATIONS: Current Outpatient Medications on File Prior to Visit  Medication Sig Dispense Refill  . ALPRAZolam (XANAX) 0.25 MG tablet Take 1 tablet (0.25 mg total) by mouth daily as needed. For anxiety 30 tablet 3  . aspirin 81 MG tablet Take 81 mg by mouth daily.    . DULoxetine (CYMBALTA) 60 MG capsule Take 1 capsule (60 mg total) by mouth daily. 90 capsule 0  . fluticasone (FLONASE) 50 MCG/ACT nasal spray Place 2 sprays into both nostrils daily. 16 g 2  . hydrOXYzine (ATARAX/VISTARIL) 10 MG tablet Take 1-2 tablets (10-20 mg total) by mouth 3 (three) times daily as needed for itching. 30 tablet 1  . levocetirizine (XYZAL) 5 MG tablet Take 5 mg by mouth daily.     . methylphenidate (RITALIN) 10 MG tablet Take 1 tablet (10 mg total) by mouth as needed. 60 tablet 0  . MYRBETRIQ 50 MG TB24 tablet daily.    . NON FORMULARY CPAP- set on 12 [employer]. 6/24 pt is currently with [facility]    . omeprazole (PRILOSEC) 20 MG capsule Take 20 mg by mouth daily.    . propranolol ER (INDERAL LA) 60 MG 24 hr capsule Take 1 capsule (60 mg total) by mouth daily. 30 capsule 2  . rosuvastatin (CRESTOR) 20 MG tablet Take 1 tablet (20 mg total) by mouth daily. 90 tablet 1  . traZODone (DESYREL) 100 MG tablet TAKE 1/2 TO 1 (ONE-HALF TO ONE) TABLET BY MOUTH ONCE DAILY AS NEEDED FOR SLEEP 90 tablet 2  . valsartan (DIOVAN) 40 MG tablet Take 1 tablet (40 mg total) by mouth daily. 30 tablet 0   No current facility-administered medications on file prior to visit.     PAST MEDICAL HISTORY: Past Medical History:  Diagnosis Date  . Allergy    rhinitis  . Anemia 02-01-12   iron absorption problem  . Arthritis 02-01-12   osteoarthritis, Back pain, spine surgeries ? retain hardware cervial and  lumbar.  . Asthma   . Back pain   . Cancer ([place]) 02-01-12   squamous cell -face   . Depression with anxiety 01/08/2011  . Diabetes ([place])   . Dyspnea   . Esophageal reflux   . Fibromyalgia   . Gallbladder problem   . Generalized headaches   . Heart murmur   . HLD (hyperlipidemia)   . HTN (hypertension)   . Hyperparathyroidism ([place]) 08/18/2014  . Joint pain   . Knee pain, right 09/20/2011  . Motion sickness 12/24/2011  . Nausea & vomiting 12/24/2011  . Nerve pain   . Obesity   . OSA (obstructive sleep apnea)   . Osteoarthritis   . Osteopenia 08/05/2014  . Panic attacks   . Pelvic floor dysfunction 09/02/2016  . Sleep apnea, obstructive   . Stress  incontinence   . Varicose veins of lower limb 10/02/2011  . Vasovagal syncope 11/22/2017  . Vitamin D deficiency     PAST SURGICAL HISTORY: Past Surgical History:  Procedure Laterality Date  . ABDOMINAL SURGERY  02-01-12   ABDOMINAL SURGERY  . APPENDECTOMY  1962  . BARIATRIC SURGERY    . BLADDER SUSPENSION     complicated by bowel nick/ clolostomy  . BLEPHAROPLASTY Bilateral   . CARPAL TUNNEL RELEASE  02-01-12   right  . CATARACT EXTRACTION Bilateral   . CERVICAL DISC SURGERY     C-7 in 2004, C-4 and C-5 in 2010  . colostomy bag     Confirm with patient. Listed under medical conditions on form dated 07/18/09.  [name] COLOSTOMY REVERSAL  02-01-12  . EYE SURGERY     b/l cataracts and b/l blepharplasty  . [place]  . GASTRIC BYPASS  2003   Patient also noted "gastric bypass resection - 2004"  . HERNIA REPAIR  2009   Two hernias and abdominal reconstruction  . ivc filter     prophyllactically- no hx DVT  . LUMBAR DISC SURGERY     L1-L3 all had surgery most recently in 2017  . LUMBAR DISC SURGERY     L3-L4, Dr. Trenton Gammon  . pannilectomy     Per medical history form dated 07/18/09.  [name] TOTAL KNEE ARTHROPLASTY  02/11/2012   Procedure: TOTAL KNEE ARTHROPLASTY;  Surgeon: Mauri Pole, MD;  Location: [facility];  Service: Orthopedics;  Laterality: Right;  . ULNAR NERVE REPAIR Left 07/18/2016   by Dr.Henry  Pool  . VULVA SURGERY  02-01-12   cyst removal-pt 8 months pregnant    SOCIAL HISTORY: Social History   Tobacco Use  . Smoking status: Never Smoker  . Smokeless tobacco: Never Used  Substance Use Topics  . Alcohol use: No    Alcohol/week: 0.0 standard drinks    Comment: special occasions  . Drug use: No    FAMILY HISTORY: Family History  Problem Relation Age of Onset  . Alcohol abuse Mother   . Other Mother        accidental med overdose  . Emphysema Mother        smoked  . Bipolar disorder Mother   . Sudden death Mother   . Liver disease Mother   . Other Father        CHF  . Diabetes Father   . Cancer Father        throat ca/ smoked  . Alcohol abuse Father   . Stroke Father   . Hypertension Father   . Liver disease Father   . Hypertension Brother   . Hyperlipidemia Brother   . Obesity Brother   . Heart attack Maternal Grandfather   . Heart attack Paternal Grandmother   . Heart attack Paternal Grandfather   . Arthritis Son        psoriatic  . Arthritis Son        psoriatic  . Obesity Son     ROS: Review of Systems  Constitutional: Positive for weight loss.  Gastrointestinal: Negative for nausea and vomiting.  Musculoskeletal:       Negative for muscle weakness  Psychiatric/Behavioral: Positive for depression. Negative for suicidal ideas.    PHYSICAL EXAM: Blood pressure (!) 108/51, pulse (!) 54, temperature 98.7 F (37.1 C), temperature source Oral, height 4\' 11"  (1.499 m), weight 155 lb (70.3 kg), SpO2 96 %. Body mass index is 31.31 kg/m. Physical  Exam Vitals signs reviewed.  Constitutional:      Appearance: Normal appearance. She is well-developed. She is obese.  Cardiovascular:     Rate and Rhythm: Normal rate.  Pulmonary:     Effort: Pulmonary effort is normal.  Musculoskeletal: Normal range of motion.  Skin:    General: Skin is warm and dry.  Neurological:     Mental Status: She is alert and oriented to person, place, and time.   Psychiatric:        Mood and Affect: Mood normal.        Behavior: Behavior normal.        Thought Content: Thought content does not include homicidal or suicidal ideation.     RECENT LABS AND TESTS: BMET    Component Value Date/Time   NA 139 09/25/2018 0950   K 4.1 09/25/2018 0950   CL 102 09/25/2018 0950   CO2 25 09/25/2018 0950   GLUCOSE 82 09/25/2018 0950   GLUCOSE 105 (H) 07/10/2017 1835   BUN 23 09/25/2018 0950   CREATININE 0.82 09/25/2018 0950   CREATININE 0.83 11/28/2012 1153   CALCIUM 8.9 09/25/2018 0950   GFRNONAA 72 09/25/2018 0950   GFRAA 83 09/25/2018 0950   Lab Results  Component Value Date   HGBA1C 5.2 09/25/2018   HGBA1C 5.7 (H) 01/29/2018   HGBA1C 5.9 07/03/2017   HGBA1C 5.9 09/06/2016   HGBA1C 5.8 03/14/2016   Lab Results  Component Value Date   INSULIN 3.5 09/25/2018   INSULIN 4.3 01/29/2018   CBC    Component Value Date/Time   WBC 5.5 09/25/2018 0950   WBC 13.0 (H) 07/10/2017 1835   RBC 3.87 09/25/2018 0950   RBC 3.88 07/10/2017 1835   HGB 12.1 09/25/2018 0950   HGB 12.4 02/19/2011 1425   HCT 38.0 09/25/2018 0950   HCT 37.1 02/19/2011 1425   PLT 201 09/25/2018 0950   MCV 98 (H) 09/25/2018 0950   MCV 93.7 02/19/2011 1425   MCH 31.3 09/25/2018 0950   MCH 28.6 07/10/2017 1835   MCHC 31.8 09/25/2018 0950   MCHC 32.2 07/10/2017 1835   RDW 14.1 09/25/2018 0950   RDW 13.7 02/19/2011 1425   LYMPHSABS 1.6 09/25/2018 0950   LYMPHSABS 1.1 02/19/2011 1425   MONOABS 1.1 (H) 07/10/2017 1835   MONOABS 0.5 02/19/2011 1425   EOSABS 0.4 09/25/2018 0950   BASOSABS 0.0 09/25/2018 0950   BASOSABS 0.0 02/19/2011 1425   Iron/TIBC/Ferritin/ %Sat    Component Value Date/Time   IRON 71 09/25/2018 0950   TIBC 314 09/25/2018 0950   FERRITIN 29 09/25/2018 0950   IRONPCTSAT 23 09/25/2018 0950   IRONPCTSAT 17 (L) 02/19/2011 1425   Lipid Panel     Component Value Date/Time   CHOL 125 09/25/2018 0950   TRIG 66 09/25/2018 0950   HDL 57 09/25/2018  0950   CHOLHDL 2 07/03/2017 1550   VLDL 13.2 07/03/2017 1550   LDLCALC 54 09/25/2018 0950   Hepatic Function Panel     Component Value Date/Time   PROT 6.1 09/25/2018 0950   ALBUMIN 4.2 09/25/2018 0950   AST 26 09/25/2018 0950   ALT 20 09/25/2018 0950   ALKPHOS 72 09/25/2018 0950   BILITOT 0.3 09/25/2018 0950   BILIDIR 0.1 06/01/2013 0745   IBILI 0.6 02/02/2013 0850      Component Value Date/Time   TSH 2.160 09/25/2018 0950   TSH 1.900 01/29/2018 1001   TSH 2.99 09/06/2016 0751     Ref. Range 09/25/2018 09:50  Vitamin D, 25-Hydroxy Latest Ref Range: 30.0 - 100.0 ng/mL 38.3    OBESITY BEHAVIORAL INTERVENTION VISIT  Today's visit was # 18   Starting weight: 186 lbs Starting date: 01/28/2018 Today's weight : 155 lbs Today's date: 11/12/2018 Total lbs lost to date: 31    11/12/2018  Height 4\' 11"  (1.499 m)  Weight 155 lb (70.3 kg)  BMI (Calculated) 31.29  BLOOD PRESSURE - SYSTOLIC [id number]  BLOOD PRESSURE - DIASTOLIC 51  Total Body Water (lbs) 47.2 lbs    ASK: We discussed the diagnosis of obesity with [name] today and [place] agreed to give [place] permission to discuss obesity behavioral modification therapy today.  ASSESS: Doloros has the diagnosis of obesity and her BMI today is 31.29 Zona is in the action stage of change   ADVISE: Johna was educated on the multiple health risks of obesity as well as the benefit of weight loss to improve her health. She was advised of the need for long term treatment and the importance of lifestyle modifications to improve her current health and to decrease her risk of future health problems.  AGREE: Multiple dietary modification options and treatment options were discussed and  Marshae agreed to follow the recommendations documented in the above note.  ARRANGE: Ericia was educated on the importance of frequent visits to treat obesity as outlined per CMS and USPSTF guidelines and agreed to schedule her next follow up appointment  today.  Corey Skains, am acting as Location manager for [employer], FNP-C. I have reviewed the above documentation for accuracy and completeness, and I agree with the above.  - Fallyn Munnerlyn, FNP-C.

## 2018-11-18 ENCOUNTER — Encounter (INDEPENDENT_AMBULATORY_CARE_PROVIDER_SITE_OTHER): Payer: Self-pay | Admitting: Family Medicine

## 2018-11-23 ENCOUNTER — Other Ambulatory Visit (INDEPENDENT_AMBULATORY_CARE_PROVIDER_SITE_OTHER): Payer: Self-pay | Admitting: Family Medicine

## 2018-11-23 DIAGNOSIS — I10 Essential (primary) hypertension: Secondary | ICD-10-CM

## 2018-11-24 ENCOUNTER — Ambulatory Visit: Payer: Medicare Other | Admitting: Family Medicine

## 2018-11-24 ENCOUNTER — Ambulatory Visit: Payer: Medicare Other | Admitting: *Deleted

## 2018-11-27 ENCOUNTER — Other Ambulatory Visit (HOSPITAL_BASED_OUTPATIENT_CLINIC_OR_DEPARTMENT_OTHER): Payer: Medicare Other

## 2018-12-03 ENCOUNTER — Telehealth (INDEPENDENT_AMBULATORY_CARE_PROVIDER_SITE_OTHER): Payer: Medicare Other | Admitting: Family Medicine

## 2018-12-03 ENCOUNTER — Other Ambulatory Visit: Payer: Self-pay

## 2018-12-03 ENCOUNTER — Encounter (INDEPENDENT_AMBULATORY_CARE_PROVIDER_SITE_OTHER): Payer: Self-pay | Admitting: Family Medicine

## 2018-12-03 DIAGNOSIS — R7303 Prediabetes: Secondary | ICD-10-CM

## 2018-12-03 DIAGNOSIS — F3289 Other specified depressive episodes: Secondary | ICD-10-CM | POA: Diagnosis not present

## 2018-12-03 DIAGNOSIS — E669 Obesity, unspecified: Secondary | ICD-10-CM | POA: Diagnosis not present

## 2018-12-03 DIAGNOSIS — Z6831 Body mass index (BMI) 31.0-31.9, adult: Secondary | ICD-10-CM

## 2018-12-03 MED ORDER — BUPROPION HCL ER (SR) 200 MG PO TB12: 200.0000 mg | ORAL_TABLET | Freq: Every day | ORAL | 0 refills | Status: DC

## 2018-12-03 NOTE — Progress Notes (Signed)
Office: [phone]  /  Fax: [phone] TeleHealth Visit:  [name] has verbally consented to this TeleHealth visit today. The patient is located at home, the provider is located at the [facility] office. The participants in this visit include the listed provider and patient and any and all parties involved. The visit was conducted today via telephone. [name] was unable to use realtime audiovisual technology today and the telehealth visit was conducted via telephone (time spent on call 15 minutes, 11:22-11:37).  HPI:   Chief Complaint: OBESITY [name] is here to discuss her progress with her obesity treatment plan. She is on the keep a food journal with 1300 to 1400 calories and 85 grams of protein daily plan and she is following her eating plan approximately 30 % of the time. She states she is exercising at the gym and walking on the treadmill 60 minutes 2 times per week. [name] has gained 3 pounds and her weight today is 156 pounds. She has been keeping her grandchildren much more due to her daughter-in-law's new job. The increases stress has caused her to eat more. She has not been able to go to the gym and she ate too much Halloween candy. We were unable to weigh the patient today for this TeleHealth visit. She feels as if she has gained weight since her last visit. She has lost 31 lbs since starting treatment with [place].  Insulin Resistance [name] has a diagnosis of insulin resistance based on her elevated fasting insulin level >5. She was diabetic before she had duodenal switch surgery in 2003.  Although [name]'s blood glucose readings are still under good control, insulin resistance puts her at greater risk of metabolic syndrome and diabetes. [name] is not taking metformin currently and she continues to work on diet and exercise to decrease risk of diabetes. Her cravings have increased, because she has been eating Halloween candy.  Depression with emotional eating behaviors She  has been keeping her grandchildren much more due to her daughter-in-law's new job. The increases stress has caused her to eat more.. Bupropion helps with stress eating and keeps her mood stable. She shows no sign of suicidal or homicidal ideations.  ASSESSMENT AND PLAN:  Prediabetes  Other depression - with emotional eating - Plan: buPROPion (WELLBUTRIN SR) 200 MG 12 hr tablet  Class 1 obesity with body mass index (BMI) of 31.0 to 31.9 in adult, unspecified obesity type, unspecified whether serious comorbidity present  PLAN:  Insulin Resistance [name] will continue to work on weight loss, exercise, and decreasing simple carbohydrates in her diet to help decrease the risk of diabetes. She was informed that eating too many simple carbohydrates or too many calories at one sitting increases the likelihood of GI side effects. [name] agreed to follow up with [place] as directed to monitor her progress.  Depression with Emotional Eating Behaviors We discussed behavior modification techniques today to help [name] deal with her emotional eating and depression. She has agreed to continue Bupropion 200 mg qAM #30 with no refills and follow up as directed.  Obesity [name] is currently in the action stage of change. As such, her goal is to continue with weight loss efforts She has agreed to keep a food journal with 1300 to 1400 calories and 85 grams of protein daily [name] will increase exercise as she is able for weight loss and overall health benefits. We discussed the following Behavioral Modification Strategies today: planning for success, increasing lean protein intake, decreasing simple carbohydrates  and emotional eating strategies  [name] has agreed to follow up with our clinic in 3 weeks. She was informed of the importance of frequent follow up visits to maximize her success with intensive lifestyle modifications for her multiple health conditions.  ALLERGIES: Allergies  Allergen Reactions  . Hydrocodone  Nausea And Vomiting     nausea and vomiting and headaches  . Amoxicillin Hives  . Cephalexin      Neuropathy  . Codeine     Crazy in the head, bp drops  . Levofloxacin Itching  . Lyrica [Pregabalin] Other (See Comments)    "Makes me like a zombie. I'm awake but I'm in slow motion."  . Meperidine Hcl     B/P drops  . Morphine And Related      States" extreme migraines"  . Oxycodone-Acetaminophen Other (See Comments)    Crazy in head, drop in bp  . Oxycodone-Acetaminophen Other (See Comments)  . Sulfonamide Derivatives Nausea And Vomiting    Stomach upset  . Valsartan-Hydrochlorothiazide Other (See Comments)    Headaches  . Adhesive [Tape] Rash    Latex in adhesive tape. Please use paper tape  . Erythromycin Diarrhea and Rash    Other reaction(s): GI Intolerance  . Penicillins Nausea And Vomiting and Rash    MEDICATIONS: Current Outpatient Medications on File Prior to Visit  Medication Sig Dispense Refill  . ALPRAZolam (XANAX) 0.25 MG tablet Take 1 tablet (0.25 mg total) by mouth daily as needed. For anxiety 30 tablet 3  . aspirin 81 MG tablet Take 81 mg by mouth daily.    . DULoxetine (CYMBALTA) 60 MG capsule Take 1 capsule (60 mg total) by mouth daily. 90 capsule 0  . fluticasone (FLONASE) 50 MCG/ACT nasal spray Place 2 sprays into both nostrils daily. 16 g 2  . hydrOXYzine (ATARAX/VISTARIL) 10 MG tablet Take 1-2 tablets (10-20 mg total) by mouth 3 (three) times daily as needed for itching. 30 tablet 1  . levocetirizine (XYZAL) 5 MG tablet Take 5 mg by mouth daily.     . methylphenidate (RITALIN) 10 MG tablet Take 1 tablet (10 mg total) by mouth as needed. 60 tablet 0  . MYRBETRIQ 50 MG TB24 tablet daily.    . NON FORMULARY CPAP- set on 12 [employer]. 6/24 pt is currently with [facility]    . omeprazole (PRILOSEC) 20 MG capsule Take 20 mg by mouth daily.    . propranolol ER (INDERAL LA) 60 MG 24 hr capsule Take 1 capsule (60 mg total) by mouth daily. 30 capsule 2  . rosuvastatin  (CRESTOR) 20 MG tablet Take 1 tablet (20 mg total) by mouth daily. 90 tablet 1  . traZODone (DESYREL) 100 MG tablet TAKE 1/2 TO 1 (ONE-HALF TO ONE) TABLET BY MOUTH ONCE DAILY AS NEEDED FOR SLEEP 90 tablet 2  . valsartan (DIOVAN) 40 MG tablet Take 1 tablet by mouth once daily 30 tablet 0  . Vitamin D, Ergocalciferol, (DRISDOL) 1.25 MG (50000 UT) CAPS capsule Take 1 capsule (50,000 Units total) by mouth every 3 (three) days. 30 capsule 0   No current facility-administered medications on file prior to visit.     PAST MEDICAL HISTORY: Past Medical History:  Diagnosis Date  . Allergy    rhinitis  . Anemia 02-01-12   iron absorption problem  . Arthritis 02-01-12   osteoarthritis, Back pain, spine surgeries ? retain hardware cervial and  lumbar.  . Asthma   . Back pain   . Cancer ([place]) 02-01-12  squamous cell -face  . Depression with anxiety 01/08/2011  . Diabetes ([place])   . Dyspnea   . Esophageal reflux   . Fibromyalgia   . Gallbladder problem   . Generalized headaches   . Heart murmur   . HLD (hyperlipidemia)   . HTN (hypertension)   . Hyperparathyroidism ([place]) 08/18/2014  . Joint pain   . Knee pain, right 09/20/2011  . Motion sickness 12/24/2011  . Nausea & vomiting 12/24/2011  . Nerve pain   . Obesity   . OSA (obstructive sleep apnea)   . Osteoarthritis   . Osteopenia 08/05/2014  . Panic attacks   . Pelvic floor dysfunction 09/02/2016  . Sleep apnea, obstructive   . Stress incontinence   . Varicose veins of lower limb 10/02/2011  . Vasovagal syncope 11/22/2017  . Vitamin D deficiency     PAST SURGICAL HISTORY: Past Surgical History:  Procedure Laterality Date  . ABDOMINAL SURGERY  02-01-12   ABDOMINAL SURGERY  . APPENDECTOMY  1962  . BARIATRIC SURGERY    . BLADDER SUSPENSION     complicated by bowel nick/ clolostomy  . BLEPHAROPLASTY Bilateral   . CARPAL TUNNEL RELEASE  02-01-12   right  . CATARACT EXTRACTION Bilateral   . CERVICAL DISC SURGERY     C-7 in 2004, C-4 and  C-5 in 2010  . colostomy bag     Confirm with patient. Listed under medical conditions on form dated 07/18/09.  [name] COLOSTOMY REVERSAL  02-01-12  . EYE SURGERY     b/l cataracts and b/l blepharplasty  . [place]  . GASTRIC BYPASS  2003   Patient also noted "gastric bypass resection - 2004"  . HERNIA REPAIR  2009   Two hernias and abdominal reconstruction  . ivc filter     prophyllactically- no hx DVT  . LUMBAR DISC SURGERY     L1-L3 all had surgery most recently in 2017  . LUMBAR DISC SURGERY     L3-L4, Dr. Trenton Gammon  . pannilectomy     Per medical history form dated 07/18/09.  [name] TOTAL KNEE ARTHROPLASTY  02/11/2012   Procedure: TOTAL KNEE ARTHROPLASTY;  Surgeon: Mauri Pole, MD;  Location: [facility];  Service: Orthopedics;  Laterality: Right;  . ULNAR NERVE REPAIR Left 07/18/2016   by Dr.Henry Pool  . VULVA SURGERY  02-01-12   cyst removal-pt 8 months pregnant    SOCIAL HISTORY: Social History   Tobacco Use  . Smoking status: Never Smoker  . Smokeless tobacco: Never Used  Substance Use Topics  . Alcohol use: No    Alcohol/week: 0.0 standard drinks    Comment: special occasions  . Drug use: No    FAMILY HISTORY: Family History  Problem Relation Age of Onset  . Alcohol abuse Mother   . Other Mother        accidental med overdose  . Emphysema Mother        smoked  . Bipolar disorder Mother   . Sudden death Mother   . Liver disease Mother   . Other Father        CHF  . Diabetes Father   . Cancer Father        throat ca/ smoked  . Alcohol abuse Father   . Stroke Father   . Hypertension Father   . Liver disease Father   . Hypertension Brother   . Hyperlipidemia Brother   . Obesity Brother   . Heart attack Maternal Grandfather   .  Heart attack Paternal Grandmother   . Heart attack Paternal Grandfather   . Arthritis Son        psoriatic  . Arthritis Son        psoriatic  . Obesity Son     ROS: Review of Systems  Constitutional: Negative for  weight loss.  Endo/Heme/Allergies:       Positive for cravings  Psychiatric/Behavioral: Positive for depression. Negative for suicidal ideas.    PHYSICAL EXAM: Pt in no acute distress  RECENT LABS AND TESTS: BMET    Component Value Date/Time   NA 139 09/25/2018 0950   K 4.1 09/25/2018 0950   CL 102 09/25/2018 0950   CO2 25 09/25/2018 0950   GLUCOSE 82 09/25/2018 0950   GLUCOSE 105 (H) 07/10/2017 1835   BUN 23 09/25/2018 0950   CREATININE 0.82 09/25/2018 0950   CREATININE 0.83 11/28/2012 1153   CALCIUM 8.9 09/25/2018 0950   GFRNONAA 72 09/25/2018 0950   GFRAA 83 09/25/2018 0950   Lab Results  Component Value Date   HGBA1C 5.2 09/25/2018   HGBA1C 5.7 (H) 01/29/2018   HGBA1C 5.9 07/03/2017   HGBA1C 5.9 09/06/2016   HGBA1C 5.8 03/14/2016   Lab Results  Component Value Date   INSULIN 3.5 09/25/2018   INSULIN 4.3 01/29/2018   CBC    Component Value Date/Time   WBC 5.5 09/25/2018 0950   WBC 13.0 (H) 07/10/2017 1835   RBC 3.87 09/25/2018 0950   RBC 3.88 07/10/2017 1835   HGB 12.1 09/25/2018 0950   HGB 12.4 02/19/2011 1425   HCT 38.0 09/25/2018 0950   HCT 37.1 02/19/2011 1425   PLT 201 09/25/2018 0950   MCV 98 (H) 09/25/2018 0950   MCV 93.7 02/19/2011 1425   MCH 31.3 09/25/2018 0950   MCH 28.6 07/10/2017 1835   MCHC 31.8 09/25/2018 0950   MCHC 32.2 07/10/2017 1835   RDW 14.1 09/25/2018 0950   RDW 13.7 02/19/2011 1425   LYMPHSABS 1.6 09/25/2018 0950   LYMPHSABS 1.1 02/19/2011 1425   MONOABS 1.1 (H) 07/10/2017 1835   MONOABS 0.5 02/19/2011 1425   EOSABS 0.4 09/25/2018 0950   BASOSABS 0.0 09/25/2018 0950   BASOSABS 0.0 02/19/2011 1425   Iron/TIBC/Ferritin/ %Sat    Component Value Date/Time   IRON 71 09/25/2018 0950   TIBC 314 09/25/2018 0950   FERRITIN 29 09/25/2018 0950   IRONPCTSAT 23 09/25/2018 0950   IRONPCTSAT 17 (L) 02/19/2011 1425   Lipid Panel     Component Value Date/Time   CHOL 125 09/25/2018 0950   TRIG 66 09/25/2018 0950   HDL 57  09/25/2018 0950   CHOLHDL 2 07/03/2017 1550   VLDL 13.2 07/03/2017 1550   LDLCALC 54 09/25/2018 0950   Hepatic Function Panel     Component Value Date/Time   PROT 6.1 09/25/2018 0950   ALBUMIN 4.2 09/25/2018 0950   AST 26 09/25/2018 0950   ALT 20 09/25/2018 0950   ALKPHOS 72 09/25/2018 0950   BILITOT 0.3 09/25/2018 0950   BILIDIR 0.1 06/01/2013 0745   IBILI 0.6 02/02/2013 0850      Component Value Date/Time   TSH 2.160 09/25/2018 0950   TSH 1.900 01/29/2018 1001   TSH 2.99 09/06/2016 0751    Results for YAILEN, GUGEL (MRN [id number]) as of 12/03/2018 16:41  Ref. Range 09/25/2018 09:50  Vitamin D, 25-Hydroxy Latest Ref Range: 30.0 - 100.0 ng/mL 38.3    I, Doreene Nest, am acting as Location manager for [employer], FNP-C  I  have reviewed the above documentation for accuracy and completeness, and I agree with the above.  - Jovonni Borquez, FNP-C.

## 2018-12-09 ENCOUNTER — Other Ambulatory Visit: Payer: Self-pay | Admitting: Family Medicine

## 2018-12-09 ENCOUNTER — Other Ambulatory Visit (INDEPENDENT_AMBULATORY_CARE_PROVIDER_SITE_OTHER): Payer: Self-pay | Admitting: Family Medicine

## 2018-12-09 DIAGNOSIS — F3289 Other specified depressive episodes: Secondary | ICD-10-CM

## 2018-12-09 NOTE — Telephone Encounter (Signed)
Requesting:  alprazolam Contract:   none UDS:   None Last Visit:   11/11/2018 Next Visit:   05/13/2019 Last Refill:   #30 with 3 refills 05/27/2018  Please Advise

## 2018-12-09 NOTE — Telephone Encounter (Signed)
She should have 2 more refills at pharmacy already?

## 2018-12-15 ENCOUNTER — Other Ambulatory Visit: Payer: Medicare Other

## 2018-12-16 ENCOUNTER — Telehealth (INDEPENDENT_AMBULATORY_CARE_PROVIDER_SITE_OTHER): Payer: Medicare Other | Admitting: Family Medicine

## 2018-12-17 ENCOUNTER — Telehealth: Payer: Self-pay

## 2018-12-17 ENCOUNTER — Other Ambulatory Visit: Payer: Self-pay

## 2018-12-17 ENCOUNTER — Ambulatory Visit (INDEPENDENT_AMBULATORY_CARE_PROVIDER_SITE_OTHER): Payer: Medicare Other | Admitting: Family Medicine

## 2018-12-17 ENCOUNTER — Encounter: Payer: Self-pay | Admitting: Family Medicine

## 2018-12-17 ENCOUNTER — Ambulatory Visit (INDEPENDENT_AMBULATORY_CARE_PROVIDER_SITE_OTHER): Payer: Medicare Other

## 2018-12-17 DIAGNOSIS — I6522 Occlusion and stenosis of left carotid artery: Secondary | ICD-10-CM

## 2018-12-17 DIAGNOSIS — R251 Tremor, unspecified: Secondary | ICD-10-CM

## 2018-12-17 MED ORDER — PROPRANOLOL HCL ER 60 MG PO CP24: 60.0000 mg | ORAL_CAPSULE | Freq: Every day | ORAL | 2 refills | Status: DC

## 2018-12-17 NOTE — Progress Notes (Signed)
Chief Complaint  Patient presents with  . Medication Problem    Subjective: Patient is a 73 y.o. female here for f/u. Due to COVID-19 pandemic, we are interacting via telephone. I verified patient's ID using 2 identifiers. Patient agreed to proceed with visit via this method. Patient is at home, I am at office. Patient and I are present for visit.   Patient was started on propanolol 60 mg daily for essential tremor.  She reports nearly 100% improvement.  She is compliant with the medication.  She is not having any side effects.  She does not wish to make any dose adjustments.  She is able to write legibly again and activities are much improved.  Past Medical History:  Diagnosis Date  . Allergy    rhinitis  . Anemia 02-01-12   iron absorption problem  . Arthritis 02-01-12   osteoarthritis, Back pain, spine surgeries ? retain hardware cervial and  lumbar.  . Asthma   . Back pain   . Cancer ([place]) 02-01-12   squamous cell -face  . Depression with anxiety 01/08/2011  . Diabetes ([place])   . Dyspnea   . Esophageal reflux   . Fibromyalgia   . Gallbladder problem   . Generalized headaches   . Heart murmur   . HLD (hyperlipidemia)   . HTN (hypertension)   . Hyperparathyroidism ([place]) 08/18/2014  . Joint pain   . Knee pain, right 09/20/2011  . Motion sickness 12/24/2011  . Nausea & vomiting 12/24/2011  . Nerve pain   . Obesity   . OSA (obstructive sleep apnea)   . Osteoarthritis   . Osteopenia 08/05/2014  . Panic attacks   . Pelvic floor dysfunction 09/02/2016  . Sleep apnea, obstructive   . Stress incontinence   . Varicose veins of lower limb 10/02/2011  . Vasovagal syncope 11/22/2017  . Vitamin D deficiency     Objective: Temp 97.8 F (36.6 C) (Oral)   No conversational dyspnea Age appropriate judgment and insight Nml affect and mood  Assessment and Plan: Tremor - Plan: propranolol ER (INDERAL LA) 60 MG 24 hr capsule  Continue propanolol Total time: 6 minutes Follow-up as  originally scheduled. The patient voiced understanding and agreement to the plan.  [employer], DO 12/17/18  1:44 PM

## 2018-12-17 NOTE — Telephone Encounter (Signed)
Copied from [place] [id number]. Topic: Appointment Scheduling - Scheduling Inquiry for Clinic >> Dec 16, 2018 11:00 AM [name] wrote: Reason for CRM: Patient would like to sch a virtual appt for tomorrow or Friday 1125 or 1127.Please advise

## 2018-12-19 ENCOUNTER — Other Ambulatory Visit: Payer: Self-pay | Admitting: Cardiology

## 2018-12-19 DIAGNOSIS — I6523 Occlusion and stenosis of bilateral carotid arteries: Secondary | ICD-10-CM

## 2018-12-23 NOTE — Progress Notes (Signed)
Primary Physician/Referring:  [name], DO  Patient ID: [name], female    DOB: 1945-10-30, 73 y.o.   MRN: [id number]  No chief complaint on file.  HPI:    HPI: [name]  is a 73 y.o. with history of obstructive sleep apnea on CPAP, prior morbid obesity and has maintained weight loss, prior history of gastric bypass in 2003, GERD, hyperlipidemia, chronic back pain, mild asymptomatic aortic stenosis and mitral regurgitation, asymptomatic bilateral carotid stenosis and presents for annual follow-up of carotid stenosis, hypertension and CV risk factor modification.   No new symptoms.  She continues to lose weight. She was enrolled in health and wellness clinic at [facility]. States that she continues to remain active, no chest pain or dyspnea or leg edema. BP is well controlled.   Past Medical History:  Diagnosis Date  . Allergy    rhinitis  . Anemia 02-01-12   iron absorption problem  . Arthritis 02-01-12   osteoarthritis, Back pain, spine surgeries ? retain hardware cervial and  lumbar.  . Asthma   . Back pain   . Cancer ([place]) 02-01-12   squamous cell -face  . Depression with anxiety 01/08/2011  . Diabetes ([place])   . Dyspnea   . Esophageal reflux   . Fibromyalgia   . Gallbladder problem   . Generalized headaches   . Heart murmur   . HLD (hyperlipidemia)   . HTN (hypertension)   . Hyperparathyroidism ([place]) 08/18/2014  . Joint pain   . Knee pain, right 09/20/2011  . Motion sickness 12/24/2011  . Nausea & vomiting 12/24/2011  . Nerve pain   . Obesity   . OSA (obstructive sleep apnea)   . Osteoarthritis   . Osteopenia 08/05/2014  . Panic attacks   . Pelvic floor dysfunction 09/02/2016  . Sleep apnea, obstructive   . Stress incontinence   . Varicose veins of lower limb 10/02/2011  . Vasovagal syncope 11/22/2017  . Vitamin D deficiency    Past Surgical History:  Procedure Laterality Date  . ABDOMINAL SURGERY  02-01-12   ABDOMINAL SURGERY  .  APPENDECTOMY  1962  . BARIATRIC SURGERY    . BLADDER SUSPENSION     complicated by bowel nick/ clolostomy  . BLEPHAROPLASTY Bilateral   . CARPAL TUNNEL RELEASE  02-01-12   right  . CATARACT EXTRACTION Bilateral   . CERVICAL DISC SURGERY     C-7 in 2004, C-4 and C-5 in 2010  . colostomy bag     Confirm with patient. Listed under medical conditions on form dated 07/18/09.  [name] COLOSTOMY REVERSAL  02-01-12  . EYE SURGERY     b/l cataracts and b/l blepharplasty  . [place]  . GASTRIC BYPASS  2003   Patient also noted "gastric bypass resection - 2004"  . HERNIA REPAIR  2009   Two hernias and abdominal reconstruction  . ivc filter     prophyllactically- no hx DVT  . LUMBAR DISC SURGERY     L1-L3 all had surgery most recently in 2017  . LUMBAR DISC SURGERY     L3-L4, Dr. Trenton Gammon  . pannilectomy     Per medical history form dated 07/18/09.  [name] TOTAL KNEE ARTHROPLASTY  02/11/2012   Procedure: TOTAL KNEE ARTHROPLASTY;  Surgeon: Mauri Pole, MD;  Location: [facility];  Service: Orthopedics;  Laterality: Right;  . ULNAR NERVE REPAIR Left 07/18/2016   by Dr.Henry Pool  . VULVA SURGERY  02-01-12  cyst removal-pt 8 months pregnant   Social History   Socioeconomic History  . Marital status: Married    Spouse name: Anjuli Plummer  . Number of children: Not on file  . Years of education: Not on file  . Highest education level: Not on file  Occupational History  . Occupation: Retired  Scientific laboratory technician  . Financial resource strain: Not on file  . Food insecurity    Worry: Not on file    Inability: Not on file  . Transportation needs    Medical: Not on file    Non-medical: Not on file  Tobacco Use  . Smoking status: Never Smoker  . Smokeless tobacco: Never Used  Substance and Sexual Activity  . Alcohol use: No    Alcohol/week: 0.0 standard drinks    Comment: special occasions  . Drug use: No  . Sexual activity: Never  Lifestyle  . Physical activity    Days per week: Not on  file    Minutes per session: Not on file  . Stress: Not on file  Relationships  . Social Herbalist on phone: Not on file    Gets together: Not on file    Attends religious service: Not on file    Active member of club or organization: Not on file    Attends meetings of clubs or organizations: Not on file    Relationship status: Not on file  . Intimate partner violence    Fear of current or ex partner: Not on file    Emotionally abused: Not on file    Physically abused: Not on file    Forced sexual activity: Not on file  Other Topics Concern  . Not on file  Social History Narrative  . Not on file   ROS  Review of Systems  Constitution: Negative for decreased appetite, malaise/fatigue, weight gain and weight loss.  Eyes: Negative for visual disturbance.  Cardiovascular: Negative for chest pain, claudication, dyspnea on exertion, leg swelling, orthopnea, palpitations and syncope.  Respiratory: Negative for hemoptysis and wheezing.   Endocrine: Negative for cold intolerance and heat intolerance.  Hematologic/Lymphatic: Does not bruise/bleed easily.  Skin: Negative for nail changes.  Musculoskeletal: Positive for back pain and joint pain. Negative for muscle weakness and myalgias.  Gastrointestinal: Negative for abdominal pain, change in bowel habit, nausea and vomiting.  Neurological: Positive for dizziness. Negative for difficulty with concentration, focal weakness and headaches.  Psychiatric/Behavioral: Negative for altered mental status and suicidal ideas.  All other systems reviewed and are negative.  Objective   Vitals with BMI 11/12/2018 11/11/2018 09/25/2018  Height 4\' 11"  5\' 0"  4\' 11"   Weight 155 lbs 157 lbs 8 oz 157 lbs 13 oz  BMI 31.29 [id number]  Systolic [id number] 93  Diastolic 51 76 39  Pulse 54 59 48     Physical Exam  Constitutional: She is oriented to person, place, and time. Vital signs are normal. She appears well-developed and well-nourished.   Well built and moderately obese  HENT:  Head: Normocephalic and atraumatic.  Neck: Normal range of motion.  Cardiovascular: Normal rate, regular rhythm, intact distal pulses and normal pulses.  Murmur heard.  Early systolic murmur is present with a grade of 3/6 at the upper right sternal border radiating to the neck. Pulses:      Carotid pulses are on the right side with bruit and on the left side with bruit. Pulmonary/Chest: Effort normal and breath sounds normal. No accessory muscle usage.  No respiratory distress.  Abdominal: Soft. Bowel sounds are normal.  Musculoskeletal: Normal range of motion.  Neurological: She is alert and oriented to person, place, and time.  Skin: Skin is warm and dry.  Vitals reviewed.  Radiology: No results found.  Laboratory examination:   Recent Labs    01/29/18 1001 09/25/18 0950  NA 144 139  K 3.9 4.1  CL 105 102  CO2 23 25  GLUCOSE 83 82  BUN 16 23  CREATININE 0.67 0.82  CALCIUM 9.0 8.9  GFRNONAA 89 72  GFRAA 103 83   CMP Latest Ref Rng & Units 09/25/2018 01/29/2018 07/10/2017  Glucose 65 - 99 mg/dL 82 83 105(H)  BUN 8 - 27 mg/dL 23 16 17   Creatinine 0.57 - 1.00 mg/dL 0.82 0.67 0.72  Sodium 134 - 144 mmol/L 139 144 139  Potassium 3.5 - 5.2 mmol/L 4.1 3.9 3.5  Chloride 96 - 106 mmol/L 102 105 106  CO2 20 - 29 mmol/L 25 23 25   Calcium 8.7 - 10.3 mg/dL 8.9 9.0 8.5(L)  Total Protein 6.0 - 8.5 g/dL 6.1 6.3 6.4(L)  Total Bilirubin 0.0 - 1.2 mg/dL 0.3 0.4 0.7  Alkaline Phos 39 - 117 IU/L 72 103 75  AST 0 - 40 IU/L 26 29 20   ALT 0 - 32 IU/L 20 22 17    CBC Latest Ref Rng & Units 09/25/2018 01/29/2018 07/10/2017  WBC 3.4 - 10.8 x10E3/uL 5.5 7.0 13.0(H)  Hemoglobin 11.1 - 15.9 g/dL 12.1 10.8(L) 11.1(L)  Hematocrit 34.0 - 46.6 % 38.0 35.0 34.5(L)  Platelets 150 - 450 x10E3/uL 201 - 203   Lipid Panel     Component Value Date/Time   CHOL 125 09/25/2018 0950   TRIG 66 09/25/2018 0950   HDL 57 09/25/2018 0950   CHOLHDL 2 07/03/2017 1550   VLDL  13.2 07/03/2017 1550   LDLCALC 54 09/25/2018 0950   HEMOGLOBIN A1C Lab Results  Component Value Date   HGBA1C 5.2 09/25/2018   MPG 114 02/02/2013   TSH Recent Labs    01/29/18 1001 09/25/18 0950  TSH 1.900 2.160   Medications   Current Outpatient Medications  Medication Instructions  . ALPRAZolam (XANAX) 0.25 mg, Oral, Daily PRN, For anxiety   . aspirin 81 mg, Daily  . buPROPion (WELLBUTRIN SR) 200 mg, Oral, Daily  . DULoxetine (CYMBALTA) 60 mg, Oral, Daily  . fluticasone (FLONASE) 50 MCG/ACT nasal spray 2 sprays, Each Nare, Daily  . hydrOXYzine (ATARAX/VISTARIL) 10-20 mg, Oral, 3 times daily PRN  . levocetirizine (XYZAL) 5 mg, Oral, Daily  . methylphenidate (RITALIN) 10 mg, Oral, As needed  . MYRBETRIQ 50 MG TB24 tablet Daily  . NON FORMULARY CPAP- set on 12 [employer].6/24 pt is currently with [facility]  . omeprazole (PRILOSEC) 20 mg, Oral, Daily  . propranolol ER (INDERAL LA) 60 mg, Oral, Daily  . rosuvastatin (CRESTOR) 20 mg, Oral, Daily  . traZODone (DESYREL) 100 MG tablet TAKE 1/2 TO 1 (ONE-HALF TO ONE) TABLET BY MOUTH ONCE DAILY AS NEEDED FOR SLEEP  . valsartan (DIOVAN) 40 MG tablet Take 1 tablet by mouth once daily  . Vitamin D (Ergocalciferol) (DRISDOL) 50,000 Units, Oral, Every 3 DAYS    Cardiac Studies:   Lexiscan myoview stress test 01/10/2018:  1. Lexiscan stress test was performed. Exercise capacity was not assessed. Stress symptoms included dyspnea, headache, dizziness. Peak effect blood pressure was 170/80 mmHg. The resting and stress electrocardiogram demonstrated normal sinus rhythm, RBBB, no resting arrhythmias and nonspecific ST-T changes. Stress EKG is  non diagnostic for ischemia as it is a pharmacologic stress. 2. The overall quality of the study is fair. Review of the raw data in a rotational cine format reveals breast attenuation, with imaging performed in sitting position. Left ventricular cavity is noted to be normal on the rest and stress studies. Gated  SPECT images reveal normal myocardial thickening and wall motion. The left ventricular ejection fraction was calculated or visually estimated to be 54%. SPECT images demonstrate small sized, mild intensity, perfusion defect in basal inferior myocardium, more prominent on rest images. This likely represent breast attenuation artifact. 3. Low risk study.   Event Monitor 30 days 12/25/2017: Minimum heart rate 44 bpm at 05:57 AM, maximum heart rate 97 bpm. Symptoms of lightheadedness and dizziness revealed normal sinus rhythm/sinus bradycardia. Rare PVCs were evident.  Echocardiogram 06/11/2017: Left ventricle cavity is normal in size. Severe concentric hypertrophy of the left ventricle. Normal global wall motion. Doppler evidence of grade II (pseudonormal) diastolic dysfunction, elevated LAP. Calculated EF 62%. Left atrial cavity is mildly dilated as per AP dimension of 3.9 cm. However, visually looks moderately dilated. Mild aortic valve leaflet calcification. Mild aortic valve stenosis. Aortic valve mean gradient of 14 mmHg, Vmax of 2.7 m/s. Calculated aortic valve area by continuity equation is 1.6 cm. No aortic valve regurgitation noted. Mild to moderate mitral regurgitation. Mild tricuspid regurgitation. Estimated pulmonary artery systolic pressure 28 mmHg. Compared to previous study dated 04/24/2016, there is interval increase in severity of aortic stenosis, mitral and tricuspid regurgitation. Diastolic dysfunction is new. Mild increase in pulmonary systolic pressure.  Carotid artery duplex  12/17/2018: Stenosis in the right internal carotid artery (16-49%). Stenosis in the left internal carotid artery (16-49%). Antegrade right vertebral artery flow. Antegrade left vertebral artery flow. Diffuse heterogeneous plaque noted in bilateral carotid arteries.  Compared to 12/18/2017, right ICA stenosis is new. Follow up in one year is appropriate if clinically indicated.  Assessment     ICD-10-CM    1. Bilateral carotid artery stenosis  I65.23   2. Bifascicular block  I45.2   3. Essential hypertension  I10   4. Hypercholesteremia  E78.00     EKG/02/2018: Normal sinus rhythm at rate of 59 bpm, left axis deviation, left anterior fascicular block.  Right bundle branch block.  Nonspecific T abnormality.  Baseline artifact. No significant change from EKG 12/25/2017   Recommendations:   HPI: [name]  is a 73 y.o. with history of obstructive sleep apnea on CPAP, prior morbid obesity and has maintained weight loss, prior history of gastric bypass in 2003, GERD, hyperlipidemia, chronic back pain, mild asymptomatic aortic stenosis and mitral regurgitation, asymptomatic bilateral carotid stenosis and presents for annual follow-up.  She is a prominent midsystolic murmur and very mild aortic stenosis and mild MR and TR, which is not clinically significant.  She has continue to loose weight with activity and diet modification. She has bifascicular block on EKG but asymptomatic. No recurrence of syncope since a year ago. BP is controlled and lipids are normal. Reviewed Carotid artery duplex  Stable findings and hence will see her back after repeat scanning in a year. Congratulated her on weight loss.   Adrian Prows, MD, [facility] 12/24/2018, 7:13 AM [facility]. [place] Pager: [phone] Office: [phone] If no answer Cell [phone]

## 2018-12-24 ENCOUNTER — Encounter: Payer: Self-pay | Admitting: Cardiology

## 2018-12-24 ENCOUNTER — Ambulatory Visit (INDEPENDENT_AMBULATORY_CARE_PROVIDER_SITE_OTHER): Payer: Medicare Other | Admitting: Cardiology

## 2018-12-24 ENCOUNTER — Other Ambulatory Visit: Payer: Self-pay

## 2018-12-24 VITALS — BP 125/47 | HR 51 | Ht 59.0 in | Wt 159.7 lb

## 2018-12-24 DIAGNOSIS — I6523 Occlusion and stenosis of bilateral carotid arteries: Secondary | ICD-10-CM | POA: Diagnosis not present

## 2018-12-24 DIAGNOSIS — I10 Essential (primary) hypertension: Secondary | ICD-10-CM

## 2018-12-24 DIAGNOSIS — E7800 Pure hypercholesterolemia, unspecified: Secondary | ICD-10-CM | POA: Diagnosis not present

## 2018-12-24 DIAGNOSIS — I452 Bifascicular block: Secondary | ICD-10-CM | POA: Diagnosis not present

## 2018-12-25 ENCOUNTER — Other Ambulatory Visit: Payer: Self-pay

## 2018-12-25 ENCOUNTER — Telehealth (INDEPENDENT_AMBULATORY_CARE_PROVIDER_SITE_OTHER): Payer: Medicare Other | Admitting: Family Medicine

## 2018-12-25 ENCOUNTER — Encounter (INDEPENDENT_AMBULATORY_CARE_PROVIDER_SITE_OTHER): Payer: Self-pay | Admitting: Family Medicine

## 2018-12-25 DIAGNOSIS — E559 Vitamin D deficiency, unspecified: Secondary | ICD-10-CM | POA: Diagnosis not present

## 2018-12-25 DIAGNOSIS — Z6831 Body mass index (BMI) 31.0-31.9, adult: Secondary | ICD-10-CM | POA: Diagnosis not present

## 2018-12-25 DIAGNOSIS — I10 Essential (primary) hypertension: Secondary | ICD-10-CM

## 2018-12-25 DIAGNOSIS — R7303 Prediabetes: Secondary | ICD-10-CM

## 2018-12-25 DIAGNOSIS — E782 Mixed hyperlipidemia: Secondary | ICD-10-CM

## 2018-12-25 DIAGNOSIS — E669 Obesity, unspecified: Secondary | ICD-10-CM | POA: Diagnosis not present

## 2018-12-25 MED ORDER — METFORMIN HCL 500 MG PO TABS: 500.0000 mg | ORAL_TABLET | Freq: Every day | ORAL | 0 refills | Status: DC

## 2018-12-25 MED ORDER — ROSUVASTATIN CALCIUM 20 MG PO TABS: 20.0000 mg | ORAL_TABLET | Freq: Every day | ORAL | 1 refills | Status: DC

## 2018-12-25 MED ORDER — VALSARTAN 40 MG PO TABS: 40.0000 mg | ORAL_TABLET | Freq: Every day | ORAL | 0 refills | Status: DC

## 2018-12-25 MED ORDER — VITAMIN D (ERGOCALCIFEROL) 1.25 MG (50000 UNIT) PO CAPS: 50000.0000 [IU] | ORAL_CAPSULE | ORAL | 0 refills | Status: DC

## 2018-12-29 DIAGNOSIS — K122 Cellulitis and abscess of mouth: Secondary | ICD-10-CM | POA: Diagnosis not present

## 2018-12-29 NOTE — Progress Notes (Addendum)
Office: [phone]  /  Fax: [phone] TeleHealth Visit:  [name] has verbally consented to this TeleHealth visit today. The patient is located at home, the provider is located at the [facility] office. The participants in this visit include the listed provider and patient and any and all parties involved. The visit was conducted today via FaceTime.  HPI:   Chief Complaint: OBESITY [name] is here to discuss her progress with her obesity treatment plan. She is on the keep a food journal with 1300 to 1400 calories and 85 grams of protein daily plan and she is following her eating plan approximately 0 % of the time. She states she is exercising 0 minutes 0 times per week. [name] was off the plan somewhat over Thanksgiving and she did not journal. She is now journaling again starting today. She did have too many carbs over the holiday which increased her hunger/ cravings after the holiday. We were unable to weigh the patient today for this TeleHealth visit. She feels as if she has gained weight since her last visit (weight 157 lbs 12/25/18). She has lost 31 lbs since starting treatment with [place].  Hyperlipidemia [name] has hyperlipidemia and her last fasting lipid panel was within normal limits. She has been trying to improve her cholesterol levels with intensive lifestyle modification including a low saturated fat diet, exercise and weight loss. She denies any chest pain or shortness of breath. She is on 20 mg of Crestor and LDL is at goal.  Lab Results  Component Value Date   CHOL 125 09/25/2018   HDL 57 09/25/2018   LDLCALC 54 09/25/2018   TRIG 66 09/25/2018   CHOLHDL 2 07/03/2017     Vitamin D deficiency [name] has a diagnosis of vitamin D deficiency. Her last vitamin D level was at 38.3 (09/25/18) and was not at goal. She does not absorb vitamin D well, due to being status post duodenal switch. [name] is currently taking vit D and she denies nausea, vomiting or muscle  weakness.  Hypertension [name] is a 73 y.o. female with hypertension. [name] hypertension is well controlled on Valsartan.  We discontinued HCTZ secondary to hypotension in September 2020. [name] denies chest pain, shortness of breath on exertion or dizziness. She is working weight loss to help control her blood pressure with the goal of decreasing her risk of heart attack and stroke.  BP Readings from Last 3 Encounters:  12/24/18 (!) 125/47  11/12/18 (!) 108/51  11/11/18 120/76    Pre-Diabetes [name] has a diagnosis of prediabetes based on her elevated Hgb A1c and was informed this puts her at greater risk of developing diabetes. She is not taking medications currently and continues to work on diet and exercise to decrease risk of diabetes. She admits to polyphagia in the morning after breakfast.  ASSESSMENT AND PLAN:  Hyperlipidemia, mixed - Plan: rosuvastatin (CRESTOR) 20 MG tablet  Vitamin D deficiency - Plan: Vitamin D, Ergocalciferol, (DRISDOL) 1.25 MG (50000 UT) CAPS capsule  Essential hypertension - Plan: valsartan (DIOVAN) 40 MG tablet  Prediabetes - Plan: metFORMIN (GLUCOPHAGE) 500 MG tablet  Class 1 obesity with body mass index (BMI) of 31.0 to 31.9 in adult, unspecified obesity type, unspecified whether serious comorbidity present  PLAN:  Hyperlipidemia Intensive lifestyle modifications as the first line treatment for hyperlipidemia. We discussed many lifestyle modifications today in depth, and [name] will continue to work on decreasing saturated fats and increasing vegetables and lean protein and  will continue to work on exercise and weight loss efforts. [name] agrees to continue Crestor 20 mg daily #90 with no refills and follow up as directed.  Vitamin D Deficiency Low vitamin D level contributes to fatigue and are associated with obesity, breast, and colon cancer. [name] agrees to continue to take prescription Vit D @50 ,000 IU every 3 days #30 with no refills and  she will follow up for routine testing of vitamin D, at least 2-3 times per year. She was informed of the risk of over-replacement of vitamin D. [name] agrees to follow up as directed.  Hypertension [name] will continue working on healthy weight loss and exercise to improve blood pressure control. [name] agreed with this plan and agreed to follow up as directed. We will continue to monitor her blood pressure as well as her progress with the above lifestyle modifications. She agreed to continue Valsartan 40 mg daily #30 with no refills and she will watch for signs of hypotension as she continues her lifestyle modifications.  Pre-Diabetes [name] will continue to work on weight loss, exercise, and decreasing simple carbohydrates in her diet to help decrease the risk of diabetes. We dicussed metformin including benefits and risks. She was informed that eating too many simple carbohydrates or too many calories at one sitting increases the likelihood of GI side effects. [name] agreed to start metformin 500 mg daily with breakfast #30 with no refills and follow up with [place] as directed to monitor her progress.  Obesity [name] is currently in the action stage of change. As such, her goal is to continue with weight loss efforts She has agreed to keep a food journal with 1300 to 1400 calories and 80 to 85 grams of protein daily [name] has been instructed to work up to a goal of 150 minutes of combined cardio and strengthening exercise per week for weight loss and overall health benefits. We discussed the following Behavioral Modification Strategies today: planning for success, keep a strict food journal, increasing lean protein intake and decreasing simple carbohydrates    [name] has agreed to follow up with our clinic in 3 weeks. She was informed of the importance of frequent follow up visits to maximize her success with intensive lifestyle modifications for her multiple health conditions.  ALLERGIES: Allergies  Allergen  Reactions  . Hydrocodone Nausea And Vomiting     nausea and vomiting and headaches  . Amoxicillin Hives  . Cephalexin      Neuropathy  . Codeine     Crazy in the head, bp drops  . Levofloxacin Itching  . Lyrica [Pregabalin] Other (See Comments)    "Makes me like a zombie. I'm awake but I'm in slow motion."  . Meperidine Hcl     B/P drops  . Morphine And Related      States" extreme migraines"  . Oxycodone-Acetaminophen Other (See Comments)    Crazy in head, drop in bp  . Oxycodone-Acetaminophen Other (See Comments)  . Sulfonamide Derivatives Nausea And Vomiting    Stomach upset  . Valsartan-Hydrochlorothiazide Other (See Comments)    Headaches  . Adhesive [Tape] Rash    Latex in adhesive tape. Please use paper tape  . Erythromycin Diarrhea and Rash    Other reaction(s): GI Intolerance  . Penicillins Nausea And Vomiting and Rash    MEDICATIONS: Current Outpatient Medications on File Prior to Visit  Medication Sig Dispense Refill  . ALPRAZolam (XANAX) 0.25 MG tablet Take 1 tablet (0.25 mg total) by mouth daily as needed.  For anxiety 30 tablet 3  . aspirin 81 MG tablet Take 81 mg by mouth daily.    [name] buPROPion (WELLBUTRIN SR) 200 MG 12 hr tablet Take 1 tablet (200 mg total) by mouth daily. 30 tablet 0  . DULoxetine (CYMBALTA) 60 MG capsule Take 1 capsule (60 mg total) by mouth daily. 90 capsule 0  . fluticasone (FLONASE) 50 MCG/ACT nasal spray Place 2 sprays into both nostrils daily. 16 g 2  . hydrOXYzine (ATARAX/VISTARIL) 10 MG tablet Take 1-2 tablets (10-20 mg total) by mouth 3 (three) times daily as needed for itching. 30 tablet 1  . levocetirizine (XYZAL) 5 MG tablet Take 5 mg by mouth daily.     . methylphenidate (RITALIN) 10 MG tablet Take 1 tablet (10 mg total) by mouth as needed. 60 tablet 0  . MYRBETRIQ 50 MG TB24 tablet daily.    . NON FORMULARY CPAP- set on 12 [employer]. 6/24 pt is currently with [facility]    . omeprazole (PRILOSEC) 20 MG capsule Take 20 mg by mouth  daily.    . propranolol ER (INDERAL LA) 60 MG 24 hr capsule Take 1 capsule (60 mg total) by mouth daily. 90 capsule 2  . traZODone (DESYREL) 100 MG tablet TAKE 1/2 TO 1 (ONE-HALF TO ONE) TABLET BY MOUTH ONCE DAILY AS NEEDED FOR SLEEP 90 tablet 2   No current facility-administered medications on file prior to visit.     PAST MEDICAL HISTORY: Past Medical History:  Diagnosis Date  . Allergy    rhinitis  . Anemia 02-01-12   iron absorption problem  . Arthritis 02-01-12   osteoarthritis, Back pain, spine surgeries ? retain hardware cervial and  lumbar.  . Asthma   . Back pain   . Cancer ([place]) 02-01-12   squamous cell -face  . Depression with anxiety 01/08/2011  . Diabetes ([place])   . Dyspnea   . Esophageal reflux   . Fibromyalgia   . Gallbladder problem   . Generalized headaches   . Heart murmur   . HLD (hyperlipidemia)   . HTN (hypertension)   . Hyperparathyroidism ([place]) 08/18/2014  . Joint pain   . Knee pain, right 09/20/2011  . Motion sickness 12/24/2011  . Nausea & vomiting 12/24/2011  . Nerve pain   . Obesity   . OSA (obstructive sleep apnea)   . Osteoarthritis   . Osteopenia 08/05/2014  . Panic attacks   . Pelvic floor dysfunction 09/02/2016  . Sleep apnea, obstructive   . Stress incontinence   . Varicose veins of lower limb 10/02/2011  . Vasovagal syncope 11/22/2017  . Vitamin D deficiency     PAST SURGICAL HISTORY: Past Surgical History:  Procedure Laterality Date  . ABDOMINAL SURGERY  02-01-12   ABDOMINAL SURGERY  . APPENDECTOMY  1962  . BARIATRIC SURGERY    . BLADDER SUSPENSION     complicated by bowel nick/ clolostomy  . BLEPHAROPLASTY Bilateral   . CARPAL TUNNEL RELEASE  02-01-12   right  . CATARACT EXTRACTION Bilateral   . CERVICAL DISC SURGERY     C-7 in 2004, C-4 and C-5 in 2010  . colostomy bag     Confirm with patient. Listed under medical conditions on form dated 07/18/09.  [name] COLOSTOMY REVERSAL  02-01-12  . EYE SURGERY     b/l cataracts and b/l  blepharplasty  . [place]  . GASTRIC BYPASS  2003   Patient also noted "gastric bypass resection - 2004"  . HERNIA REPAIR  2009   Two hernias and abdominal reconstruction  . ivc filter     prophyllactically- no hx DVT  . LUMBAR DISC SURGERY     L1-L3 all had surgery most recently in 2017  . LUMBAR DISC SURGERY     L3-L4, Dr. Trenton Gammon  . pannilectomy     Per medical history form dated 07/18/09.  [name] TOTAL KNEE ARTHROPLASTY  02/11/2012   Procedure: TOTAL KNEE ARTHROPLASTY;  Surgeon: Mauri Pole, MD;  Location: [facility];  Service: Orthopedics;  Laterality: Right;  . ULNAR NERVE REPAIR Left 07/18/2016   by Dr.Henry Pool  . VULVA SURGERY  02-01-12   cyst removal-pt 8 months pregnant    SOCIAL HISTORY: Social History   Tobacco Use  . Smoking status: Never Smoker  . Smokeless tobacco: Never Used  Substance Use Topics  . Alcohol use: No    Alcohol/week: 0.0 standard drinks    Comment: special occasions  . Drug use: No    FAMILY HISTORY: Family History  Problem Relation Age of Onset  . Alcohol abuse Mother   . Other Mother        accidental med overdose  . Emphysema Mother        smoked  . Bipolar disorder Mother   . Sudden death Mother   . Liver disease Mother   . Other Father        CHF  . Diabetes Father   . Cancer Father        throat ca/ smoked  . Alcohol abuse Father   . Stroke Father   . Hypertension Father   . Liver disease Father   . Hypertension Brother   . Hyperlipidemia Brother   . Obesity Brother   . Heart attack Maternal Grandfather   . Heart attack Paternal Grandmother   . Heart attack Paternal Grandfather   . Arthritis Son        psoriatic  . Arthritis Son        psoriatic  . Obesity Son     ROS: Review of Systems  Constitutional: Negative for weight loss.  Respiratory: Negative for shortness of breath (on exertion).   Cardiovascular: Negative for chest pain.  Neurological: Negative for dizziness.  Endo/Heme/Allergies:        Positive for polyphagia    PHYSICAL EXAM: Pt in no acute distress  RECENT LABS AND TESTS: BMET    Component Value Date/Time   NA 139 09/25/2018 0950   K 4.1 09/25/2018 0950   CL 102 09/25/2018 0950   CO2 25 09/25/2018 0950   GLUCOSE 82 09/25/2018 0950   GLUCOSE 105 (H) 07/10/2017 1835   BUN 23 09/25/2018 0950   CREATININE 0.82 09/25/2018 0950   CREATININE 0.83 11/28/2012 1153   CALCIUM 8.9 09/25/2018 0950   GFRNONAA 72 09/25/2018 0950   GFRAA 83 09/25/2018 0950   Lab Results  Component Value Date   HGBA1C 5.2 09/25/2018   HGBA1C 5.7 (H) 01/29/2018   HGBA1C 5.9 07/03/2017   HGBA1C 5.9 09/06/2016   HGBA1C 5.8 03/14/2016   Lab Results  Component Value Date   INSULIN 3.5 09/25/2018   INSULIN 4.3 01/29/2018   CBC    Component Value Date/Time   WBC 5.5 09/25/2018 0950   WBC 13.0 (H) 07/10/2017 1835   RBC 3.87 09/25/2018 0950   RBC 3.88 07/10/2017 1835   HGB 12.1 09/25/2018 0950   HGB 12.4 02/19/2011 1425   HCT 38.0 09/25/2018 0950   HCT 37.1 02/19/2011 1425  PLT 201 09/25/2018 0950   MCV 98 (H) 09/25/2018 0950   MCV 93.7 02/19/2011 1425   MCH 31.3 09/25/2018 0950   MCH 28.6 07/10/2017 1835   MCHC 31.8 09/25/2018 0950   MCHC 32.2 07/10/2017 1835   RDW 14.1 09/25/2018 0950   RDW 13.7 02/19/2011 1425   LYMPHSABS 1.6 09/25/2018 0950   LYMPHSABS 1.1 02/19/2011 1425   MONOABS 1.1 (H) 07/10/2017 1835   MONOABS 0.5 02/19/2011 1425   EOSABS 0.4 09/25/2018 0950   BASOSABS 0.0 09/25/2018 0950   BASOSABS 0.0 02/19/2011 1425   Iron/TIBC/Ferritin/ %Sat    Component Value Date/Time   IRON 71 09/25/2018 0950   TIBC 314 09/25/2018 0950   FERRITIN 29 09/25/2018 0950   IRONPCTSAT 23 09/25/2018 0950   IRONPCTSAT 17 (L) 02/19/2011 1425   Lipid Panel     Component Value Date/Time   CHOL 125 09/25/2018 0950   TRIG 66 09/25/2018 0950   HDL 57 09/25/2018 0950   CHOLHDL 2 07/03/2017 1550   VLDL 13.2 07/03/2017 1550   LDLCALC 54 09/25/2018 0950   Hepatic Function  Panel     Component Value Date/Time   PROT 6.1 09/25/2018 0950   ALBUMIN 4.2 09/25/2018 0950   AST 26 09/25/2018 0950   ALT 20 09/25/2018 0950   ALKPHOS 72 09/25/2018 0950   BILITOT 0.3 09/25/2018 0950   BILIDIR 0.1 06/01/2013 0745   IBILI 0.6 02/02/2013 0850      Component Value Date/Time   TSH 2.160 09/25/2018 0950   TSH 1.900 01/29/2018 1001   TSH 2.99 09/06/2016 0751     Ref. Range 09/25/2018 09:50  Vitamin D, 25-Hydroxy Latest Ref Range: 30.0 - 100.0 ng/mL 38.3    I, Doreene Nest, am acting as Location manager for [employer], FNP-C.  I have reviewed the above documentation for accuracy and completeness, and I agree with the above.  - Tung Pustejovsky, FNP-C.

## 2019-01-01 ENCOUNTER — Other Ambulatory Visit (HOSPITAL_BASED_OUTPATIENT_CLINIC_OR_DEPARTMENT_OTHER): Payer: Medicare Other

## 2019-01-09 DIAGNOSIS — Z20828 Contact with and (suspected) exposure to other viral communicable diseases: Secondary | ICD-10-CM | POA: Diagnosis not present

## 2019-01-12 ENCOUNTER — Other Ambulatory Visit: Payer: Self-pay

## 2019-01-12 ENCOUNTER — Encounter (INDEPENDENT_AMBULATORY_CARE_PROVIDER_SITE_OTHER): Payer: Self-pay | Admitting: Physician Assistant

## 2019-01-12 ENCOUNTER — Ambulatory Visit (INDEPENDENT_AMBULATORY_CARE_PROVIDER_SITE_OTHER): Payer: Medicare Other | Admitting: Physician Assistant

## 2019-01-12 VITALS — BP 115/68 | HR 60 | Temp 98.6°F | Ht 59.0 in | Wt 152.0 lb

## 2019-01-12 DIAGNOSIS — E782 Mixed hyperlipidemia: Secondary | ICD-10-CM | POA: Diagnosis not present

## 2019-01-12 DIAGNOSIS — Z6830 Body mass index (BMI) 30.0-30.9, adult: Secondary | ICD-10-CM | POA: Diagnosis not present

## 2019-01-12 DIAGNOSIS — E669 Obesity, unspecified: Secondary | ICD-10-CM

## 2019-01-12 DIAGNOSIS — G4709 Other insomnia: Secondary | ICD-10-CM

## 2019-01-12 DIAGNOSIS — I10 Essential (primary) hypertension: Secondary | ICD-10-CM

## 2019-01-12 DIAGNOSIS — R7303 Prediabetes: Secondary | ICD-10-CM | POA: Diagnosis not present

## 2019-01-12 MED ORDER — ROSUVASTATIN CALCIUM 20 MG PO TABS: 20.0000 mg | ORAL_TABLET | Freq: Every day | ORAL | 0 refills | Status: DC

## 2019-01-12 MED ORDER — METFORMIN HCL 500 MG PO TABS: 500.0000 mg | ORAL_TABLET | Freq: Every day | ORAL | 0 refills | Status: DC

## 2019-01-12 MED ORDER — VALSARTAN 40 MG PO TABS: 40.0000 mg | ORAL_TABLET | Freq: Every day | ORAL | 0 refills | Status: DC

## 2019-01-12 MED ORDER — TRAZODONE HCL 100 MG PO TABS: ORAL_TABLET | ORAL | 0 refills | Status: DC

## 2019-01-12 NOTE — Progress Notes (Signed)
Office: [phone]  /  Fax: [phone]   HPI:  Chief Complaint: OBESITY [name] is here to discuss her progress with her obesity treatment plan. She is keeping a food journal with 1300-1400 calories and 85g of protein daily and states she is following her eating plan approximately 20 % of the time. She states she is exercising by walking and doing cardio for 60 minutes 3 times per week.  [name] reports a decrease in hunger since starting the Metformin. She gets bored with food and meals quickly. She is looking for new ideas today.   Today's visit was # 19  Starting weight: 186 lbs Starting date: 01/28/18 Today's weight : Weight: 152 lb (68.9 kg)  Today's date: 01/12/2019 Total lbs lost to date: 34 lbs Total lbs lost since last in-office visit: 3 lbs  Hypertension [name] has a diagnosis of HTN. She denies chest pain. She is currently taking Valsartan and Propranolol. Her blood pressure today is normal.   Hyperlipidemia  [name] has a diagnosis of HLD. She is currently on Crestor and denies myalgias secondary to Crestor. She denies chest pain.   Prediabetes  [name] has a diagnosis of prediabetes. She is currently on Metformin She denies nausea, vomiting, and diarrhea. She notes decreased hunger through the day and has to work to get in all of her protein.   Insomnia  [name] has a diagnosis of insomnia. She is currently taking trazodone and reports it helps with sleep without drowsiness in the morning.   ASSESSMENT AND PLAN:  Essential hypertension - Plan: valsartan (DIOVAN) 40 MG tablet  Hyperlipidemia, mixed - Plan: rosuvastatin (CRESTOR) 20 MG tablet  Prediabetes - Plan: metFORMIN (GLUCOPHAGE) 500 MG tablet  Other insomnia - Plan: traZODone (DESYREL) 100 MG tablet  Class 1 obesity with serious comorbidity and body mass index (BMI) of 30.0 to 30.9 in adult, unspecified obesity type  PLAN:  Hypertension [name] is working on healthy weight loss and exercise to improve blood pressure  control. We will watch for signs of hypotension as she continues her lifestyle modifications. Valsartan 40 mg daily #30 with no refill sent in today.   Hyperlipidemia Intensive lifestyle modifications as the first line treatment for hyperlipidemia. We discussed many lifestyle modifications today and [name] will continue to work on diet, exercise and weight loss efforts. Crestor 20 mg daily #30 with no refills sent in today.   Pre-Diabetes [name] will continue to work on weight loss, exercise, and decreasing simple carbohydrates to help decrease the risk of diabetes. Metformin 500 mg daily #30 with no refills sent in today, she will change to taking it every night with dinner.   Insomnia The problem of recurrent insomnia was discussed. Avoidance of caffeine was strongly encouraged and sleep hygiene issues were reviewed. Trazodone 50 mg to 100 mg every night as needed for sleep was sent in today.   Obesity [name] is currently in the action stage of change. As such, her goal is to continue with weight loss efforts She has agreed to keep a food journal with 1300-1400 calories and 85 g of protein daily.  [name] has been instructed to work up to a goal of 150 minutes of combined cardio and strengthening exercise per week for weight loss and overall health benefits. We discussed the following Behavioral Modification Strategies today: work on meal planning and easy cooking plans and planning for success.    [name] has agreed to follow up with our clinic in 3 weeks. She was informed of the importance of  frequent follow up visits to maximize her success with intensive lifestyle modifications for her multiple health conditions.  ALLERGIES: Allergies  Allergen Reactions  . Hydrocodone Nausea And Vomiting     nausea and vomiting and headaches  . Amoxicillin Hives  . Cephalexin      Neuropathy  . Codeine     Crazy in the head, bp drops  . Levofloxacin Itching  . Lyrica [Pregabalin] Other (See Comments)     "Makes me like a zombie. I'm awake but I'm in slow motion."  . Meperidine Hcl     B/P drops  . Morphine And Related      States" extreme migraines"  . Oxycodone-Acetaminophen Other (See Comments)    Crazy in head, drop in bp  . Oxycodone-Acetaminophen Other (See Comments)  . Sulfonamide Derivatives Nausea And Vomiting    Stomach upset  . Valsartan-Hydrochlorothiazide Other (See Comments)    Headaches  . Adhesive [Tape] Rash    Latex in adhesive tape. Please use paper tape  . Erythromycin Diarrhea and Rash    Other reaction(s): GI Intolerance  . Penicillins Nausea And Vomiting and Rash    MEDICATIONS: Current Outpatient Medications on File Prior to Visit  Medication Sig Dispense Refill  . ALPRAZolam (XANAX) 0.25 MG tablet Take 1 tablet (0.25 mg total) by mouth daily as needed. For anxiety 30 tablet 3  . aspirin 81 MG tablet Take 81 mg by mouth daily.    [name] buPROPion (WELLBUTRIN SR) 200 MG 12 hr tablet Take 1 tablet (200 mg total) by mouth daily. 30 tablet 0  . DULoxetine (CYMBALTA) 60 MG capsule Take 1 capsule (60 mg total) by mouth daily. 90 capsule 0  . fluticasone (FLONASE) 50 MCG/ACT nasal spray Place 2 sprays into both nostrils daily. 16 g 2  . hydrOXYzine (ATARAX/VISTARIL) 10 MG tablet Take 1-2 tablets (10-20 mg total) by mouth 3 (three) times daily as needed for itching. 30 tablet 1  . levocetirizine (XYZAL) 5 MG tablet Take 5 mg by mouth daily.     . methylphenidate (RITALIN) 10 MG tablet Take 1 tablet (10 mg total) by mouth as needed. 60 tablet 0  . MYRBETRIQ 50 MG TB24 tablet daily.    . NON FORMULARY CPAP- set on 12 [employer]. 6/24 pt is currently with [facility]    . omeprazole (PRILOSEC) 20 MG capsule Take 20 mg by mouth daily.    . propranolol ER (INDERAL LA) 60 MG 24 hr capsule Take 1 capsule (60 mg total) by mouth daily. 90 capsule 2  . Vitamin D, Ergocalciferol, (DRISDOL) 1.25 MG (50000 UT) CAPS capsule Take 1 capsule (50,000 Units total) by mouth every 3 (three) days. 30  capsule 0   No current facility-administered medications on file prior to visit.    PAST MEDICAL HISTORY: Past Medical History:  Diagnosis Date  . Allergy    rhinitis  . Anemia 02-01-12   iron absorption problem  . Arthritis 02-01-12   osteoarthritis, Back pain, spine surgeries ? retain hardware cervial and  lumbar.  . Asthma   . Back pain   . Cancer ([place]) 02-01-12   squamous cell -face  . Depression with anxiety 01/08/2011  . Diabetes ([place])   . Dyspnea   . Esophageal reflux   . Fibromyalgia   . Gallbladder problem   . Generalized headaches   . Heart murmur   . HLD (hyperlipidemia)   . HTN (hypertension)   . Hyperparathyroidism ([place]) 08/18/2014  . Joint pain   . Knee  pain, right 09/20/2011  . Motion sickness 12/24/2011  . Nausea & vomiting 12/24/2011  . Nerve pain   . Obesity   . OSA (obstructive sleep apnea)   . Osteoarthritis   . Osteopenia 08/05/2014  . Panic attacks   . Pelvic floor dysfunction 09/02/2016  . Sleep apnea, obstructive   . Stress incontinence   . Varicose veins of lower limb 10/02/2011  . Vasovagal syncope 11/22/2017  . Vitamin D deficiency     PAST SURGICAL HISTORY: Past Surgical History:  Procedure Laterality Date  . ABDOMINAL SURGERY  02-01-12   ABDOMINAL SURGERY  . APPENDECTOMY  1962  . BARIATRIC SURGERY    . BLADDER SUSPENSION     complicated by bowel nick/ clolostomy  . BLEPHAROPLASTY Bilateral   . CARPAL TUNNEL RELEASE  02-01-12   right  . CATARACT EXTRACTION Bilateral   . CERVICAL DISC SURGERY     C-7 in 2004, C-4 and C-5 in 2010  . colostomy bag     Confirm with patient. Listed under medical conditions on form dated 07/18/09.  [name] COLOSTOMY REVERSAL  02-01-12  . EYE SURGERY     b/l cataracts and b/l blepharplasty  . [place]  . GASTRIC BYPASS  2003   Patient also noted "gastric bypass resection - 2004"  . HERNIA REPAIR  2009   Two hernias and abdominal reconstruction  . ivc filter     prophyllactically- no hx DVT  .  LUMBAR DISC SURGERY     L1-L3 all had surgery most recently in 2017  . LUMBAR DISC SURGERY     L3-L4, Dr. Trenton Gammon  . pannilectomy     Per medical history form dated 07/18/09.  [name] TOTAL KNEE ARTHROPLASTY  02/11/2012   Procedure: TOTAL KNEE ARTHROPLASTY;  Surgeon: Mauri Pole, MD;  Location: [facility];  Service: Orthopedics;  Laterality: Right;  . ULNAR NERVE REPAIR Left 07/18/2016   by Dr.Henry Pool  . VULVA SURGERY  02-01-12   cyst removal-pt 8 months pregnant    SOCIAL HISTORY: Social History   Tobacco Use  . Smoking status: Never Smoker  . Smokeless tobacco: Never Used  Substance Use Topics  . Alcohol use: No    Alcohol/week: 0.0 standard drinks    Comment: special occasions  . Drug use: No    FAMILY HISTORY: Family History  Problem Relation Age of Onset  . Alcohol abuse Mother   . Other Mother        accidental med overdose  . Emphysema Mother        smoked  . Bipolar disorder Mother   . Sudden death Mother   . Liver disease Mother   . Other Father        CHF  . Diabetes Father   . Cancer Father        throat ca/ smoked  . Alcohol abuse Father   . Stroke Father   . Hypertension Father   . Liver disease Father   . Hypertension Brother   . Hyperlipidemia Brother   . Obesity Brother   . Heart attack Maternal Grandfather   . Heart attack Paternal Grandmother   . Heart attack Paternal Grandfather   . Arthritis Son        psoriatic  . Arthritis Son        psoriatic  . Obesity Son     ROS: Review of Systems  Constitutional: Positive for weight loss.  Cardiovascular: Negative for chest pain.  Gastrointestinal: Negative for  diarrhea, nausea and vomiting.  Musculoskeletal: Negative for myalgias.  Psychiatric/Behavioral: The patient has insomnia.     PHYSICAL EXAM: Blood pressure 115/68, pulse 60, temperature 98.6 F (37 C), temperature source Oral, height 4\' 11"  (1.499 m), weight 152 lb (68.9 kg), SpO2 98 %. Body mass index is 30.7 kg/m. Physical  Exam Vitals reviewed.  Constitutional:      Appearance: Normal appearance.  HENT:     Head: Normocephalic.  Cardiovascular:     Rate and Rhythm: Normal rate.  Pulmonary:     Effort: Pulmonary effort is normal.  Musculoskeletal:        General: Normal range of motion.  Skin:    General: Skin is warm and dry.  Neurological:     Mental Status: She is alert and oriented to person, place, and time.  Psychiatric:        Mood and Affect: Mood normal.        Behavior: Behavior normal.     RECENT LABS AND TESTS: BMET    Component Value Date/Time   NA 139 09/25/2018 0950   K 4.1 09/25/2018 0950   CL 102 09/25/2018 0950   CO2 25 09/25/2018 0950   GLUCOSE 82 09/25/2018 0950   GLUCOSE 105 (H) 07/10/2017 1835   BUN 23 09/25/2018 0950   CREATININE 0.82 09/25/2018 0950   CREATININE 0.83 11/28/2012 1153   CALCIUM 8.9 09/25/2018 0950   GFRNONAA 72 09/25/2018 0950   GFRAA 83 09/25/2018 0950   Lab Results  Component Value Date   HGBA1C 5.2 09/25/2018   HGBA1C 5.7 (H) 01/29/2018   HGBA1C 5.9 07/03/2017   HGBA1C 5.9 09/06/2016   HGBA1C 5.8 03/14/2016   Lab Results  Component Value Date   INSULIN 3.5 09/25/2018   INSULIN 4.3 01/29/2018   CBC    Component Value Date/Time   WBC 5.5 09/25/2018 0950   WBC 13.0 (H) 07/10/2017 1835   RBC 3.87 09/25/2018 0950   RBC 3.88 07/10/2017 1835   HGB 12.1 09/25/2018 0950   HGB 12.4 02/19/2011 1425   HCT 38.0 09/25/2018 0950   HCT 37.1 02/19/2011 1425   PLT 201 09/25/2018 0950   MCV 98 (H) 09/25/2018 0950   MCV 93.7 02/19/2011 1425   MCH 31.3 09/25/2018 0950   MCH 28.6 07/10/2017 1835   MCHC 31.8 09/25/2018 0950   MCHC 32.2 07/10/2017 1835   RDW 14.1 09/25/2018 0950   RDW 13.7 02/19/2011 1425   LYMPHSABS 1.6 09/25/2018 0950   LYMPHSABS 1.1 02/19/2011 1425   MONOABS 1.1 (H) 07/10/2017 1835   MONOABS 0.5 02/19/2011 1425   EOSABS 0.4 09/25/2018 0950   BASOSABS 0.0 09/25/2018 0950   BASOSABS 0.0 02/19/2011 1425    Iron/TIBC/Ferritin/ %Sat    Component Value Date/Time   IRON 71 09/25/2018 0950   TIBC 314 09/25/2018 0950   FERRITIN 29 09/25/2018 0950   IRONPCTSAT 23 09/25/2018 0950   IRONPCTSAT 17 (L) 02/19/2011 1425   Lipid Panel     Component Value Date/Time   CHOL 125 09/25/2018 0950   TRIG 66 09/25/2018 0950   HDL 57 09/25/2018 0950   CHOLHDL 2 07/03/2017 1550   VLDL 13.2 07/03/2017 1550   LDLCALC 54 09/25/2018 0950   Hepatic Function Panel     Component Value Date/Time   PROT 6.1 09/25/2018 0950   ALBUMIN 4.2 09/25/2018 0950   AST 26 09/25/2018 0950   ALT 20 09/25/2018 0950   ALKPHOS 72 09/25/2018 0950   BILITOT 0.3 09/25/2018 0950  BILIDIR 0.1 06/01/2013 0745   IBILI 0.6 02/02/2013 0850      Component Value Date/Time   TSH 2.160 09/25/2018 0950   TSH 1.900 01/29/2018 1001   TSH 2.99 09/06/2016 0751     OBESITY BEHAVIORAL INTERVENTION VISIT DOCUMENTATION FOR INSURANCE (~15 minutes)    ASK: We discussed the diagnosis of obesity with [name] today and [name] agreed to give [place] permission to discuss obesity behavioral modification therapy today.  ASSESS: Jakailah has the diagnosis of obesity and her BMI today is 30.70 [name] is in the action stage of change   ADVISE: Alayia was educated on the multiple health risks of obesity as well as the benefit of weight loss to improve her health. She was advised of the need for long term treatment and the importance of lifestyle modifications to improve her current health and to decrease her risk of future health problems.  AGREE: Multiple dietary modification options and treatment options were discussed and  [name] agreed to follow the recommendations documented in the above note.  ARRANGE: [name] was educated on the importance of frequent visits to treat obesity as outlined per CMS and USPSTF guidelines and agreed to schedule her next follow up appointment today.   Leary Roca, am acting as transcriptionist for Abby Potash, PA-C I, Abby Potash, PA-C have reviewed above note and agree with its content

## 2019-01-26 ENCOUNTER — Other Ambulatory Visit (INDEPENDENT_AMBULATORY_CARE_PROVIDER_SITE_OTHER): Payer: Self-pay | Admitting: Family Medicine

## 2019-01-26 DIAGNOSIS — R7303 Prediabetes: Secondary | ICD-10-CM

## 2019-01-26 DIAGNOSIS — I10 Essential (primary) hypertension: Secondary | ICD-10-CM

## 2019-01-28 DIAGNOSIS — G4730 Sleep apnea, unspecified: Secondary | ICD-10-CM | POA: Diagnosis not present

## 2019-01-28 DIAGNOSIS — M25511 Pain in right shoulder: Secondary | ICD-10-CM | POA: Diagnosis not present

## 2019-01-28 DIAGNOSIS — Z9884 Bariatric surgery status: Secondary | ICD-10-CM | POA: Diagnosis not present

## 2019-01-28 DIAGNOSIS — Z0000 Encounter for general adult medical examination without abnormal findings: Secondary | ICD-10-CM | POA: Diagnosis not present

## 2019-01-28 DIAGNOSIS — M1990 Unspecified osteoarthritis, unspecified site: Secondary | ICD-10-CM | POA: Diagnosis not present

## 2019-02-02 ENCOUNTER — Other Ambulatory Visit: Payer: Self-pay | Admitting: Family Medicine

## 2019-02-02 ENCOUNTER — Other Ambulatory Visit (INDEPENDENT_AMBULATORY_CARE_PROVIDER_SITE_OTHER): Payer: Self-pay | Admitting: Family Medicine

## 2019-02-02 DIAGNOSIS — F3289 Other specified depressive episodes: Secondary | ICD-10-CM

## 2019-02-02 DIAGNOSIS — I10 Essential (primary) hypertension: Secondary | ICD-10-CM

## 2019-02-02 DIAGNOSIS — R7303 Prediabetes: Secondary | ICD-10-CM

## 2019-02-04 ENCOUNTER — Ambulatory Visit (INDEPENDENT_AMBULATORY_CARE_PROVIDER_SITE_OTHER): Payer: Medicare Other | Admitting: Family Medicine

## 2019-02-04 ENCOUNTER — Encounter (INDEPENDENT_AMBULATORY_CARE_PROVIDER_SITE_OTHER): Payer: Self-pay | Admitting: Family Medicine

## 2019-02-04 ENCOUNTER — Other Ambulatory Visit: Payer: Self-pay

## 2019-02-04 VITALS — BP 125/70 | HR 57 | Temp 98.0°F | Ht 59.0 in | Wt 155.0 lb

## 2019-02-04 DIAGNOSIS — Z6831 Body mass index (BMI) 31.0-31.9, adult: Secondary | ICD-10-CM

## 2019-02-04 DIAGNOSIS — N3281 Overactive bladder: Secondary | ICD-10-CM | POA: Diagnosis not present

## 2019-02-04 DIAGNOSIS — R7303 Prediabetes: Secondary | ICD-10-CM | POA: Diagnosis not present

## 2019-02-04 DIAGNOSIS — E669 Obesity, unspecified: Secondary | ICD-10-CM | POA: Diagnosis not present

## 2019-02-04 DIAGNOSIS — G250 Essential tremor: Secondary | ICD-10-CM | POA: Diagnosis not present

## 2019-02-04 MED ORDER — OZEMPIC (0.25 OR 0.5 MG/DOSE) 2 MG/1.5ML ~~LOC~~ SOPN: 0.2500 mg | PEN_INJECTOR | SUBCUTANEOUS | 0 refills | Status: DC

## 2019-02-08 NOTE — Progress Notes (Signed)
Chief Complaint:   OBESITY [name] is here to discuss her progress with her obesity treatment plan along with follow-up of her obesity related diagnoses. [name] is keeping a food journal of 1300 calories and 85 grams of protein daily and states she is following her eating plan approximately 10% of the time. [name] states she is exercising at the gym and walking 20 to 30 minutes 3 times per week.  Today's visit was #: 22 Starting weight: 186 lbs Starting date: 01/28/2018 Today's weight: 155 lbs Today's date: 02/04/2019 Total lbs lost to date: 31 Total lbs lost since last in-office visit: 0  Interim History: [name] reports not doing well on the plan over the holiday due to cooking too many treats. She has mostly been back on the plan over the last few days. She does plan on cheating over her birthday. She reports only getting 70 grams per day of protein.  She did establish with a PCP in [facility]. New PCP: Rosalyn Gess, NP, [facility], [place]  Subjective:   Prediabetes [name] has a diagnosis of prediabetes based on her elevated Hgb A1c and was informed this puts her at greater risk of developing diabetes. Her last A1c was 5.7, but she had been diabetic before duodenal switch in 2003. She is currently on metformin. Shauntee admits polyphagia, even with metformin. She continues to work on diet and exercise to decrease her risk of diabetes.   Lab Results  Component Value Date   HGBA1C 5.2 09/25/2018   Lab Results  Component Value Date   INSULIN 3.5 09/25/2018   INSULIN 4.3 01/29/2018   Essential tremor [name] has worsening tremor. Propranolol was started by her PCP three months ago and it has helped some but this has now worsened slightly again. She is wondering if her dose can ne increased. Her pulse is 57 today.  Assessment/Plan:   Prediabetes [name] will continue to work on weight loss, exercise, and decreasing simple carbohydrates to help decrease the risk of  diabetes. [name] agrees to start Ozempic 0.25 mg weekly #1 pen with no refills and continue metformin. Prescription was also written today for metformin 500 mg daily #30 with no refills.  Essential tremor [name] will see her PCP regarding tremor. Her pulse is 57 on current dose of propranolol.  Obesity [name] is currently in the action stage of change. As such, her goal is to continue with weight loss efforts. She has agreed to keeping a food journal and adhering to recommended goals of 1300 to 1400 calories and 80 grams of protein daily protein daily.   [name] will increase cardio exercise to 30 minutes, 5 times per week.  Behavioral modification strategies: increasing lean protein intake and planning for success. We discussed protein rich foods.  [name] has agreed to follow-up with our clinic in 2 weeks. She was informed of the importance of frequent follow-up visits to maximize her success with intensive lifestyle modifications for her multiple health conditions.   Objective:   Blood pressure 125/70, pulse (!) 57, temperature 98 F (36.7 C), height 4\' 11"  (1.499 m), weight 155 lb (70.3 kg), SpO2 96 %. Body mass index is 31.31 kg/m.  General: Cooperative, alert, well developed, in no acute distress. HEENT: Conjunctivae and lids unremarkable. Cardiovascular: Regular rhythm.  Lungs: Normal work of breathing. Neurologic: No focal deficits.   Lab Results  Component Value Date   CREATININE 0.82 09/25/2018   BUN 23 09/25/2018   NA 139 09/25/2018  K 4.1 09/25/2018   CL 102 09/25/2018   CO2 25 09/25/2018   Lab Results  Component Value Date   ALT 20 09/25/2018   AST 26 09/25/2018   ALKPHOS 72 09/25/2018   BILITOT 0.3 09/25/2018   Lab Results  Component Value Date   HGBA1C 5.2 09/25/2018   HGBA1C 5.7 (H) 01/29/2018   HGBA1C 5.9 07/03/2017   HGBA1C 5.9 09/06/2016   HGBA1C 5.8 03/14/2016   Lab Results  Component Value Date   INSULIN 3.5 09/25/2018   INSULIN 4.3 01/29/2018   Lab  Results  Component Value Date   TSH 2.160 09/25/2018   Lab Results  Component Value Date   CHOL 125 09/25/2018   HDL 57 09/25/2018   LDLCALC 54 09/25/2018   TRIG 66 09/25/2018   CHOLHDL 2 07/03/2017   Lab Results  Component Value Date   WBC 5.5 09/25/2018   HGB 12.1 09/25/2018   HCT 38.0 09/25/2018   MCV 98 (H) 09/25/2018   PLT 201 09/25/2018   Lab Results  Component Value Date   IRON 71 09/25/2018   TIBC 314 09/25/2018   FERRITIN 29 09/25/2018    Ref. Range 09/25/2018 09:50  Vitamin D, 25-Hydroxy Latest Ref Range: 30.0 - 100.0 ng/mL 38.3    Obesity Behavioral Intervention Documentation for Insurance:   Approximately 15 minutes were spent on the discussion below.  ASK: We discussed the diagnosis of obesity with Amberleigh today and Malarie agreed to give [place] permission to discuss obesity behavioral modification therapy today.  ASSESS: [name] has the diagnosis of obesity and her BMI today is 31.29. [name] is in the action stage of change.   ADVISE: [name] was educated on the multiple health risks of obesity as well as the benefit of weight loss to improve her health. She was advised of the need for long term treatment and the importance of lifestyle modifications to improve her current health and to decrease her risk of future health problems.  AGREE: Multiple dietary modification options and treatment options were discussed and [name] agreed to follow the recommendations documented in the above note.  ARRANGE: [name] was educated on the importance of frequent visits to treat obesity as outlined per CMS and USPSTF guidelines and agreed to schedule her next follow up appointment today.  Attestation Statements:   Reviewed by clinician on day of visit: allergies, medications, problem list, medical history, surgical history, family history, social history, and previous encounter notes.  Corey Skains, am acting as Location manager for [employer], FNP-C.  I have reviewed the above  documentation for accuracy and completeness, and I agree with the above. -  Korryn Pancoast [employer], FNP-C

## 2019-02-09 MED ORDER — METFORMIN HCL 500 MG PO TABS: 500.0000 mg | ORAL_TABLET | Freq: Every day | ORAL | 0 refills | Status: DC

## 2019-02-13 DIAGNOSIS — M7541 Impingement syndrome of right shoulder: Secondary | ICD-10-CM | POA: Diagnosis not present

## 2019-02-13 DIAGNOSIS — M25511 Pain in right shoulder: Secondary | ICD-10-CM | POA: Diagnosis not present

## 2019-02-16 ENCOUNTER — Other Ambulatory Visit (INDEPENDENT_AMBULATORY_CARE_PROVIDER_SITE_OTHER): Payer: Self-pay | Admitting: Family Medicine

## 2019-02-16 DIAGNOSIS — F3289 Other specified depressive episodes: Secondary | ICD-10-CM

## 2019-02-18 DIAGNOSIS — L821 Other seborrheic keratosis: Secondary | ICD-10-CM | POA: Diagnosis not present

## 2019-02-18 DIAGNOSIS — D0430 Carcinoma in situ of skin of unspecified part of face: Secondary | ICD-10-CM | POA: Diagnosis not present

## 2019-02-18 DIAGNOSIS — Z23 Encounter for immunization: Secondary | ICD-10-CM | POA: Diagnosis not present

## 2019-02-19 ENCOUNTER — Telehealth (INDEPENDENT_AMBULATORY_CARE_PROVIDER_SITE_OTHER): Payer: Medicare Other | Admitting: Family Medicine

## 2019-02-19 ENCOUNTER — Other Ambulatory Visit: Payer: Self-pay

## 2019-02-19 ENCOUNTER — Encounter (INDEPENDENT_AMBULATORY_CARE_PROVIDER_SITE_OTHER): Payer: Self-pay | Admitting: Family Medicine

## 2019-02-19 DIAGNOSIS — R7303 Prediabetes: Secondary | ICD-10-CM

## 2019-02-19 DIAGNOSIS — G4709 Other insomnia: Secondary | ICD-10-CM | POA: Insufficient documentation

## 2019-02-19 DIAGNOSIS — E669 Obesity, unspecified: Secondary | ICD-10-CM

## 2019-02-19 DIAGNOSIS — Z6831 Body mass index (BMI) 31.0-31.9, adult: Secondary | ICD-10-CM

## 2019-02-19 DIAGNOSIS — I10 Essential (primary) hypertension: Secondary | ICD-10-CM | POA: Diagnosis not present

## 2019-02-19 MED ORDER — TRAZODONE HCL 100 MG PO TABS: ORAL_TABLET | ORAL | 0 refills | Status: DC

## 2019-02-19 MED ORDER — PROPRANOLOL HCL ER 60 MG PO CP24: 60.0000 mg | ORAL_CAPSULE | Freq: Every day | ORAL | 2 refills | Status: DC

## 2019-02-19 NOTE — Progress Notes (Signed)
TeleHealth Visit:  Due to the COVID-19 pandemic, this visit was completed with telemedicine (audio/video) technology to reduce patient and provider exposure as well as to preserve personal protective equipment.   [name] has verbally consented to this TeleHealth visit. The patient is located at home, the provider is located at the [facility] office. The participants in this visit include the listed provider and patient. The visit was conducted today via FaceTime.  Chief Complaint: OBESITY [name] is here to discuss her progress with her obesity treatment plan along with follow-up of her obesity related diagnoses. [name] is keeping a food journal and adhering to recommended goals of 1300 calories and 85 grams of protein and states she is following her eating plan approximately 40% of the time. [name] states she is walking 30-60 minutes 3-4 times per week.  Today's visit was #: 23 Starting weight: 186 lbs Starting date: 01/28/2018  Interim History: [name] reports having  Headaches recently. She is not drinking enough water- she limits fluids due to urinary incontinence. She is not meeting protein goals and averages   50-60 grams of protein a day.  She has lost weight and reports a weight of 151 lbs today.   Subjective:   Other insomnia. [name] sleeps well with Trazodone. She has been taking this for years.  Prediabetes. [name] has a diagnosis of prediabetes based on her elevated HgA1c and was informed this puts her at greater risk of developing diabetes. She continues to work on diet and exercise to decrease her risk of diabetes. She denies nausea or hypoglycemia. Ozempic was started at her last visit. She reports a decreased appetite with Ozempic. She was diabetic before duodenal switch surgery.  Lab Results  Component Value Date   HGBA1C 5.2 09/25/2018   Lab Results  Component Value Date   INSULIN 3.5 09/25/2018   INSULIN 4.3 01/29/2018   Essential hypertension. [name]  reports some lightheadedness and blood pressures have been low (110/40, 110/50). She is not drinking enough water. She takes valsartan and propanolol which is for both hypertension and essential tremor.  BP Readings from Last 3 Encounters:  02/04/19 125/70  01/12/19 115/68  12/24/18 (!) 125/47   Lab Results  Component Value Date   CREATININE 0.82 09/25/2018   CREATININE 0.67 01/29/2018   CREATININE 0.72 07/10/2017   Assessment/Plan:   Other insomnia. [name] Refill was given for traZODone (DESYREL) 100 MG tablet #90 with 0 refills (1/2 to 1 tablet QD PRN for sleep).  Prediabetes. [name] will continue to work on weight loss, exercise, and decreasing simple carbohydrates to help decrease the risk of diabetes. She will continue Ozempic and metformin.  Essential hypertension. [name] is working on healthy weight loss and exercise to improve blood pressure control.  [name] will discontinue Valsartan and increase her water intake to 48 ounces a day. She was given a refill on her propranolol ER (INDERAL LA) 60 MG 24 hr capsule #30 with 0 refills. Will have her check her blood pressure  2 times a week.  Class 1 obesity with body mass index (BMI) of 31.0 to 31.9 in adult, unspecified obesity type, unspecified whether serious comorbidity present.  [name] is currently in the action stage of change. As such, her goal is to continue with weight loss efforts. She has agreed to keeping a food journal and adhering to recommended goals of 1300-1400 calories and 80 grams of protein. She will add a protein shake on days with protein < 80 grams.  Exercise goals: [name] will continue her current exercise regimen.  Behavioral modification strategies: increasing lean protein intake, increasing water intake to 48 ounces, and planning for success.  [name] has agreed to follow-up with our clinic in 2 weeks. She was informed of the importance of frequent follow-up visits to maximize her success with intensive lifestyle modifications  for her multiple health conditions.  Objective:   VITALS: Per patient if applicable, see vitals. GENERAL: Alert and in no acute distress. CARDIOPULMONARY: No increased WOB. Speaking in clear sentences.  PSYCH: Pleasant and cooperative. Speech normal rate and rhythm. Affect is appropriate. Insight and judgement are appropriate. Attention is focused, linear, and appropriate.  NEURO: Oriented as arrived to appointment on time with no prompting.   Lab Results  Component Value Date   CREATININE 0.82 09/25/2018   BUN 23 09/25/2018   NA 139 09/25/2018   K 4.1 09/25/2018   CL 102 09/25/2018   CO2 25 09/25/2018   Lab Results  Component Value Date   ALT 20 09/25/2018   AST 26 09/25/2018   ALKPHOS 72 09/25/2018   BILITOT 0.3 09/25/2018   Lab Results  Component Value Date   HGBA1C 5.2 09/25/2018   HGBA1C 5.7 (H) 01/29/2018   HGBA1C 5.9 07/03/2017   HGBA1C 5.9 09/06/2016   HGBA1C 5.8 03/14/2016   Lab Results  Component Value Date   INSULIN 3.5 09/25/2018   INSULIN 4.3 01/29/2018   Lab Results  Component Value Date   TSH 2.160 09/25/2018   Lab Results  Component Value Date   CHOL 125 09/25/2018   HDL 57 09/25/2018   LDLCALC 54 09/25/2018   TRIG 66 09/25/2018   CHOLHDL 2 07/03/2017   Lab Results  Component Value Date   WBC 5.5 09/25/2018   HGB 12.1 09/25/2018   HCT 38.0 09/25/2018   MCV 98 (H) 09/25/2018   PLT 201 09/25/2018   Lab Results  Component Value Date   IRON 71 09/25/2018   TIBC 314 09/25/2018   FERRITIN 29 09/25/2018   Attestation Statements:   Reviewed by clinician on day of visit: allergies, medications, problem list, medical history, surgical history, family history, social history, and previous encounter notes.  [name], am acting as Location manager for [employer], FNP   I have reviewed the above documentation for accuracy and completeness, and I agree with the above. - Gail Fick, FNP

## 2019-02-24 ENCOUNTER — Telehealth (INDEPENDENT_AMBULATORY_CARE_PROVIDER_SITE_OTHER): Payer: Medicare Other | Admitting: Family Medicine

## 2019-02-24 DIAGNOSIS — M25511 Pain in right shoulder: Secondary | ICD-10-CM | POA: Diagnosis not present

## 2019-02-24 DIAGNOSIS — M7541 Impingement syndrome of right shoulder: Secondary | ICD-10-CM | POA: Diagnosis not present

## 2019-02-25 DIAGNOSIS — I83893 Varicose veins of bilateral lower extremities with other complications: Secondary | ICD-10-CM | POA: Diagnosis not present

## 2019-02-27 DIAGNOSIS — M7541 Impingement syndrome of right shoulder: Secondary | ICD-10-CM | POA: Diagnosis not present

## 2019-02-27 DIAGNOSIS — M25511 Pain in right shoulder: Secondary | ICD-10-CM | POA: Diagnosis not present

## 2019-03-03 DIAGNOSIS — M25511 Pain in right shoulder: Secondary | ICD-10-CM | POA: Diagnosis not present

## 2019-03-03 DIAGNOSIS — M7541 Impingement syndrome of right shoulder: Secondary | ICD-10-CM | POA: Diagnosis not present

## 2019-03-05 ENCOUNTER — Telehealth (INDEPENDENT_AMBULATORY_CARE_PROVIDER_SITE_OTHER): Payer: Medicare Other | Admitting: Family Medicine

## 2019-03-05 ENCOUNTER — Other Ambulatory Visit: Payer: Self-pay

## 2019-03-05 ENCOUNTER — Encounter (INDEPENDENT_AMBULATORY_CARE_PROVIDER_SITE_OTHER): Payer: Self-pay | Admitting: Family Medicine

## 2019-03-05 DIAGNOSIS — E559 Vitamin D deficiency, unspecified: Secondary | ICD-10-CM

## 2019-03-05 DIAGNOSIS — Z6831 Body mass index (BMI) 31.0-31.9, adult: Secondary | ICD-10-CM

## 2019-03-05 DIAGNOSIS — E669 Obesity, unspecified: Secondary | ICD-10-CM | POA: Diagnosis not present

## 2019-03-05 DIAGNOSIS — M25511 Pain in right shoulder: Secondary | ICD-10-CM | POA: Diagnosis not present

## 2019-03-05 DIAGNOSIS — R630 Anorexia: Secondary | ICD-10-CM | POA: Diagnosis not present

## 2019-03-05 DIAGNOSIS — M7541 Impingement syndrome of right shoulder: Secondary | ICD-10-CM | POA: Diagnosis not present

## 2019-03-05 MED ORDER — VITAMIN D (ERGOCALCIFEROL) 1.25 MG (50000 UNIT) PO CAPS: 50000.0000 [IU] | ORAL_CAPSULE | ORAL | 0 refills | Status: DC

## 2019-03-05 NOTE — Progress Notes (Signed)
TeleHealth Visit:  Due to the COVID-19 pandemic, this visit was completed with telemedicine (audio/video) technology to reduce patient and provider exposure as well as to preserve personal protective equipment.   [name] has verbally consented to this TeleHealth visit. The patient is located at home, the provider is located at the [facility] office. The participants in this visit include the listed provider and patient and any and all parties involved. The visit was conducted today via telephone.  [name] was unable to use realtime audiovisual technology today and the telehealth visit was conducted via telephone.  Chief Complaint: OBESITY [name] is here to discuss her progress with her obesity treatment plan along with follow-up of her obesity related diagnoses. [name] is on the keeping a food journal of 800 calories and 60 grams of protein daily plan and states she is following her eating plan approximately 50% of the time. [name] states she is walking 60 minutes 2 times per week.  Today's visit was #: 24 Starting weight: 186 lbs Starting date: 01/28/2018  Interim History: [name] reports a complete lack of hunger. She is forcing herself to eat. She is focusing on her protein intake but has not been journaling.  She denies nausea or constipation. [name] denies any other symptoms. She has never had this happen before. She is eating about 800 calories per day and about 60 grams of protein daily. [name] is drinking plenty of water.  Subjective:   [name] Malik has had a total lack of appetite for the last 1 1/2 weeks. She denies nausea, constipation or blood in her stool. [name] reports low energy. She has been on Ozempic for three weeks.  Vitamin D deficiency  [name]'s last Vitamin D level was 38.3 on 09/25/18 and was not at goal. She is chronically vitamin D deficient since duodenal switch. [name] is taking Rx vit D every 3 days.   Assessment/Plan:   [name] [name] will see her PCP if  [name] does not improve in the next week or two.. She agrees to discontinue Ozempic for now.  Vitamin D deficiency  Low Vitamin D level contributes to fatigue and are associated with obesity, breast, and colon cancer. [name] agrees to continue to take prescription Vitamin D @50 ,000 IU every 3 days #10 with no refills and she will follow-up for routine testing of Vitamin D, at least 2-3 times per year to avoid over-replacement.  Class 1 obesity with body mass index (BMI) of 31.0 to 31.9 in adult, unspecified obesity type, unspecified whether serious comorbidity present [name] is currently in the action stage of change. As such, her goal is to continue with weight loss efforts. She has agreed to keeping a food journal and adhering to recommended goals of 1300 to 1400 calories and 80 grams of protein daily.   Exercise goals: [name] will continue walking for 60 minutes, 2 times per week.  Behavioral modification strategies: increasing lean protein intake, no skipping meals and planning for success.  [name] has agreed to follow-up with our clinic in 2 weeks. She was informed of the importance of frequent follow-up visits to maximize her success with intensive lifestyle modifications for her multiple health conditions.  Objective:   VITALS: Per patient if applicable, see vitals. GENERAL: Alert and in no acute distress. CARDIOPULMONARY: No increased WOB. Speaking in clear sentences.  PSYCH: Pleasant and cooperative. Speech normal rate and rhythm. Affect is appropriate. Insight and judgement are appropriate. Attention is focused, linear, and appropriate.  NEURO: Oriented as  arrived to appointment on time with no prompting.   Lab Results  Component Value Date   CREATININE 0.82 09/25/2018   BUN 23 09/25/2018   NA 139 09/25/2018   K 4.1 09/25/2018   CL 102 09/25/2018   CO2 25 09/25/2018   Lab Results  Component Value Date   ALT 20 09/25/2018   AST 26 09/25/2018   ALKPHOS 72 09/25/2018   BILITOT  0.3 09/25/2018   Lab Results  Component Value Date   HGBA1C 5.2 09/25/2018   HGBA1C 5.7 (H) 01/29/2018   HGBA1C 5.9 07/03/2017   HGBA1C 5.9 09/06/2016   HGBA1C 5.8 03/14/2016   Lab Results  Component Value Date   INSULIN 3.5 09/25/2018   INSULIN 4.3 01/29/2018   Lab Results  Component Value Date   TSH 2.160 09/25/2018   Lab Results  Component Value Date   CHOL 125 09/25/2018   HDL 57 09/25/2018   LDLCALC 54 09/25/2018   TRIG 66 09/25/2018   CHOLHDL 2 07/03/2017   Lab Results  Component Value Date   WBC 5.5 09/25/2018   HGB 12.1 09/25/2018   HCT 38.0 09/25/2018   MCV 98 (H) 09/25/2018   PLT 201 09/25/2018   Lab Results  Component Value Date   IRON 71 09/25/2018   TIBC 314 09/25/2018   FERRITIN 29 09/25/2018    Ref. Range 09/25/2018 09:50  Vitamin D, 25-Hydroxy Latest Ref Range: 30.0 - 100.0 ng/mL 38.3    Attestation Statements:   Reviewed by clinician on day of visit: allergies, medications, problem list, medical history, surgical history, family history, social history, and previous encounter notes.  Time spent on visit including pre-visit chart review and post-visit care was 20 minutes (9:40-10:00 AM).   Corey Skains, am acting as Location manager for [employer], FNP-C.  I have reviewed the above documentation for accuracy and completeness, and I agree with the above. - Shley Dolby [employer], FNP-C

## 2019-03-09 DIAGNOSIS — M7541 Impingement syndrome of right shoulder: Secondary | ICD-10-CM | POA: Diagnosis not present

## 2019-03-09 DIAGNOSIS — M25511 Pain in right shoulder: Secondary | ICD-10-CM | POA: Diagnosis not present

## 2019-03-10 DIAGNOSIS — I959 Hypotension, unspecified: Secondary | ICD-10-CM | POA: Diagnosis not present

## 2019-03-10 LAB — CBC AND DIFFERENTIAL
HCT: 37 (ref 36–46)
Hemoglobin: 12.3 (ref 12.0–16.0)
Platelets: 136 — AB (ref 150–399)
WBC: 5.2

## 2019-03-10 LAB — BASIC METABOLIC PANEL
BUN: 22 — AB (ref 4–21)
CO2: 29 — AB (ref 13–22)
Chloride: 100 (ref 99–108)
Creatinine: 0.8 (ref 0.5–1.1)
Glucose: 80
Potassium: 4.2 (ref 3.4–5.3)
Sodium: 136 — AB (ref 137–147)

## 2019-03-10 LAB — CBC: RBC: 4.04 (ref 3.87–5.11)

## 2019-03-10 LAB — COMPREHENSIVE METABOLIC PANEL: Calcium: 8.4 — AB (ref 8.7–10.7)

## 2019-03-12 DIAGNOSIS — M25511 Pain in right shoulder: Secondary | ICD-10-CM | POA: Diagnosis not present

## 2019-03-12 DIAGNOSIS — M7541 Impingement syndrome of right shoulder: Secondary | ICD-10-CM | POA: Diagnosis not present

## 2019-03-16 DIAGNOSIS — M7541 Impingement syndrome of right shoulder: Secondary | ICD-10-CM | POA: Diagnosis not present

## 2019-03-16 DIAGNOSIS — M25511 Pain in right shoulder: Secondary | ICD-10-CM | POA: Diagnosis not present

## 2019-03-17 DIAGNOSIS — Z0389 Encounter for observation for other suspected diseases and conditions ruled out: Secondary | ICD-10-CM | POA: Diagnosis not present

## 2019-03-17 DIAGNOSIS — I83893 Varicose veins of bilateral lower extremities with other complications: Secondary | ICD-10-CM | POA: Diagnosis not present

## 2019-03-19 ENCOUNTER — Encounter (INDEPENDENT_AMBULATORY_CARE_PROVIDER_SITE_OTHER): Payer: Self-pay | Admitting: Family Medicine

## 2019-03-19 ENCOUNTER — Telehealth (INDEPENDENT_AMBULATORY_CARE_PROVIDER_SITE_OTHER): Payer: Medicare Other | Admitting: Family Medicine

## 2019-03-19 ENCOUNTER — Other Ambulatory Visit: Payer: Self-pay

## 2019-03-19 DIAGNOSIS — Z6831 Body mass index (BMI) 31.0-31.9, adult: Secondary | ICD-10-CM

## 2019-03-19 DIAGNOSIS — I9589 Other hypotension: Secondary | ICD-10-CM | POA: Diagnosis not present

## 2019-03-19 DIAGNOSIS — R7303 Prediabetes: Secondary | ICD-10-CM

## 2019-03-19 DIAGNOSIS — E669 Obesity, unspecified: Secondary | ICD-10-CM | POA: Diagnosis not present

## 2019-03-19 DIAGNOSIS — M25511 Pain in right shoulder: Secondary | ICD-10-CM | POA: Diagnosis not present

## 2019-03-19 DIAGNOSIS — E782 Mixed hyperlipidemia: Secondary | ICD-10-CM

## 2019-03-19 DIAGNOSIS — M7541 Impingement syndrome of right shoulder: Secondary | ICD-10-CM | POA: Diagnosis not present

## 2019-03-19 MED ORDER — ROSUVASTATIN CALCIUM 20 MG PO TABS: 20.0000 mg | ORAL_TABLET | Freq: Every day | ORAL | 0 refills | Status: DC

## 2019-03-19 MED ORDER — OZEMPIC (0.25 OR 0.5 MG/DOSE) 2 MG/1.5ML ~~LOC~~ SOPN: 0.2500 mg | PEN_INJECTOR | SUBCUTANEOUS | 0 refills | Status: DC

## 2019-03-19 NOTE — Progress Notes (Signed)
TeleHealth Visit:  Due to the COVID-19 pandemic, this visit was completed with telemedicine (audio/video) technology to reduce patient and provider exposure as well as to preserve personal protective equipment.   [name] has verbally consented to this TeleHealth visit. The patient is located at home, the provider is located at the [facility] office. The participants in this visit include the listed provider and patient and any and all parties involved. The visit was conducted today via FaceTime.  Chief Complaint: OBESITY [name] is here to discuss her progress with her obesity treatment plan along with follow-up of her obesity related diagnoses. [name] is keeping a food journal of 1300 to 1400 calories and 65 to 75 grams of protein daily and states she is following her eating plan approximately 50% of the time. [name] states she is exercising 0 minutes 0 times per week.  Today's visit was #: 25 Starting weight: 186 lbs Starting date: 01/28/2018  Interim History: [name]'s weight is 148 pounds today. She has gained a few pounds. At her last visit she reported lack of appetite. She reports her appetite is back mostly. [name] is not journaling. She did see her PCP about anorexia and she is feeling better.  Subjective:   Prediabetes  [name] has a diagnosis of prediabetes. Her PCP discontinued metformin, due to a lack of appetite, but wanted her to continue Ozempic. She continues to work on diet and exercise to decrease her risk of diabetes.   Lab Results  Component Value Date   HGBA1C 5.2 09/25/2018   Lab Results  Component Value Date   INSULIN 3.5 09/25/2018   INSULIN 4.3 01/29/2018   Other specified hypotension [name]'s blood pressure was 90/50 at home today. She is on Propranolol for tremor. Her PCP is monitoring her blood pressure. She is drinking 48 ounces of water per day. She notes feeling weak.  BP Readings from Last 3 Encounters:  02/04/19 125/70  01/12/19 115/68   12/24/18 (!) 125/47   Hyperlipidemia, mixed  [name] has hyperlipidemia and her triglycerides, LDL and HDL were within normal limits. Her labs were recently done at her PCP and patient reports that they were good. She has been trying to improve her cholesterol levels with intensive lifestyle modification including a low saturated fat diet, exercise and weight loss.   Lab Results  Component Value Date   ALT 20 09/25/2018   AST 26 09/25/2018   ALKPHOS 72 09/25/2018   BILITOT 0.3 09/25/2018   Lab Results  Component Value Date   CHOL 125 09/25/2018   HDL 57 09/25/2018   LDLCALC 54 09/25/2018   TRIG 66 09/25/2018   CHOLHDL 2 07/03/2017   Assessment/Plan:   Prediabetes  [name] will continue to work on weight loss, exercise, and decreasing simple carbohydrates to help decrease the risk of diabetes. [name] agreed to continue Ozempic 0.25 mg weekly #1 pen with no refills  Other specified hypotension I encouraged patient to increase her water intake to 64 ounces per day. She will continue to monitor her blood pressure at home.  Hyperlipidemia, mixed    We discussed several lifestyle modifications today and [name] will continue to work on diet, exercise and weight loss efforts. [name] agrees to continue rosuvastatin (CRESTOR) 20 MG tablet once daily #30 with no refills. Orders and follow up as documented in patient record.  Patient is to bring a copy of her labs to the next visit.  Class 1 obesity with body mass index (BMI) of 31.0  to 31.9 in adult, unspecified obesity type, unspecified whether serious comorbidity present [name] is currently in the action stage of change. As such, her goal is to continue with weight loss efforts. She has agreed to keeping a food journal and adhering to recommended goals of 1300 to 1400 calories and 80 grams of protein daily.   Exercise goals: [name] plans on starting to walk again for exercise as she starts to feel better.   Behavioral modification strategies:  increasing lean protein intake, increasing water intake and keeping a strict food journal.  [name] has agreed to follow-up with our clinic in 2 weeks. She was informed of the importance of frequent follow-up visits to maximize her success with intensive lifestyle modifications for her multiple health conditions.  Objective:   VITALS: Per patient if applicable, see vitals. GENERAL: Alert and in no acute distress. CARDIOPULMONARY: No increased WOB. Speaking in clear sentences.  PSYCH: Pleasant and cooperative. Speech normal rate and rhythm. Affect is appropriate. Insight and judgement are appropriate. Attention is focused, linear, and appropriate.  NEURO: Oriented as arrived to appointment on time with no prompting.   Lab Results  Component Value Date   CREATININE 0.82 09/25/2018   BUN 23 09/25/2018   NA 139 09/25/2018   K 4.1 09/25/2018   CL 102 09/25/2018   CO2 25 09/25/2018   Lab Results  Component Value Date   ALT 20 09/25/2018   AST 26 09/25/2018   ALKPHOS 72 09/25/2018   BILITOT 0.3 09/25/2018   Lab Results  Component Value Date   HGBA1C 5.2 09/25/2018   HGBA1C 5.7 (H) 01/29/2018   HGBA1C 5.9 07/03/2017   HGBA1C 5.9 09/06/2016   HGBA1C 5.8 03/14/2016   Lab Results  Component Value Date   INSULIN 3.5 09/25/2018   INSULIN 4.3 01/29/2018   Lab Results  Component Value Date   TSH 2.160 09/25/2018   Lab Results  Component Value Date   CHOL 125 09/25/2018   HDL 57 09/25/2018   LDLCALC 54 09/25/2018   TRIG 66 09/25/2018   CHOLHDL 2 07/03/2017   Lab Results  Component Value Date   WBC 5.5 09/25/2018   HGB 12.1 09/25/2018   HCT 38.0 09/25/2018   MCV 98 (H) 09/25/2018   PLT 201 09/25/2018   Lab Results  Component Value Date   IRON 71 09/25/2018   TIBC 314 09/25/2018   FERRITIN 29 09/25/2018    Ref. Range 09/25/2018 09:50  Vitamin D, 25-Hydroxy Latest Ref Range: 30.0 - 100.0 ng/mL 38.3    Attestation Statements:   Reviewed by clinician on day of visit:  allergies, medications, problem list, medical history, surgical history, family history, social history, and previous encounter notes.  Corey Skains, am acting as Location manager for [employer], FNP-C.  I have reviewed the above documentation for accuracy and completeness, and I agree with the above. - Dawn [employer], FNP-C

## 2019-03-25 DIAGNOSIS — I959 Hypotension, unspecified: Secondary | ICD-10-CM | POA: Diagnosis not present

## 2019-03-25 DIAGNOSIS — F419 Anxiety disorder, unspecified: Secondary | ICD-10-CM | POA: Diagnosis not present

## 2019-03-26 DIAGNOSIS — M7541 Impingement syndrome of right shoulder: Secondary | ICD-10-CM | POA: Diagnosis not present

## 2019-03-26 DIAGNOSIS — M25511 Pain in right shoulder: Secondary | ICD-10-CM | POA: Diagnosis not present

## 2019-04-08 ENCOUNTER — Encounter (INDEPENDENT_AMBULATORY_CARE_PROVIDER_SITE_OTHER): Payer: Self-pay | Admitting: Family Medicine

## 2019-04-08 ENCOUNTER — Other Ambulatory Visit: Payer: Self-pay

## 2019-04-08 ENCOUNTER — Ambulatory Visit (INDEPENDENT_AMBULATORY_CARE_PROVIDER_SITE_OTHER): Payer: Medicare Other | Admitting: Family Medicine

## 2019-04-08 VITALS — BP 103/64 | HR 62 | Temp 98.1°F | Ht 59.0 in | Wt 142.0 lb

## 2019-04-08 DIAGNOSIS — E669 Obesity, unspecified: Secondary | ICD-10-CM | POA: Diagnosis not present

## 2019-04-08 DIAGNOSIS — G250 Essential tremor: Secondary | ICD-10-CM | POA: Diagnosis not present

## 2019-04-08 DIAGNOSIS — R7303 Prediabetes: Secondary | ICD-10-CM | POA: Diagnosis not present

## 2019-04-08 DIAGNOSIS — E559 Vitamin D deficiency, unspecified: Secondary | ICD-10-CM | POA: Diagnosis not present

## 2019-04-08 DIAGNOSIS — Z6830 Body mass index (BMI) 30.0-30.9, adult: Secondary | ICD-10-CM | POA: Diagnosis not present

## 2019-04-08 DIAGNOSIS — F3289 Other specified depressive episodes: Secondary | ICD-10-CM | POA: Diagnosis not present

## 2019-04-08 DIAGNOSIS — E7849 Other hyperlipidemia: Secondary | ICD-10-CM

## 2019-04-08 MED ORDER — OZEMPIC (0.25 OR 0.5 MG/DOSE) 2 MG/1.5ML ~~LOC~~ SOPN: 0.2500 mg | PEN_INJECTOR | SUBCUTANEOUS | 0 refills | Status: DC

## 2019-04-08 MED ORDER — PROPRANOLOL HCL 40 MG PO TABS: 40.0000 mg | ORAL_TABLET | Freq: Every day | ORAL | 0 refills | Status: DC

## 2019-04-08 MED ORDER — BUPROPION HCL ER (SR) 200 MG PO TB12: 200.0000 mg | ORAL_TABLET | Freq: Every day | ORAL | 0 refills | Status: DC

## 2019-04-08 MED ORDER — ROSUVASTATIN CALCIUM 20 MG PO TABS: 20.0000 mg | ORAL_TABLET | Freq: Every day | ORAL | 0 refills | Status: DC

## 2019-04-08 MED ORDER — METFORMIN HCL 500 MG PO TABS: 500.0000 mg | ORAL_TABLET | Freq: Every day | ORAL | 0 refills | Status: DC

## 2019-04-08 NOTE — Progress Notes (Signed)
Chief Complaint:   OBESITY [name] is here to discuss her progress with her obesity treatment plan along with follow-up of her obesity related diagnoses. [name] is on keeping a food journal and adhering to recommended goals of 1300-1400 calories and 80 grams of protein and states she is following her eating plan approximately 20% of the time. [name] states she is walking for 30-40 minutes 3 times per week.  Today's visit was #: 13 Starting weight: 186 lbs Starting date: 01/28/2018 Today's weight: 142 lbs Today's date: 04/08/2019 Total lbs lost to date: 44 lbs Total lbs lost since last in-office visit: 13 lbs  Interim History: [name] has done very well over the last 2 months since her last in office visit.  She reports her appetite is good now.  She has not been journaling but is doing PC/Jordan.  Her weight goal is 135 pounds. She has a lot of loose skin, especially on her legs which bothers her.  Her high weight was 307 pounds before she had her duodenal switch.   Subjective:   1. Prediabetes She denies polyphagia.  Last A1c was 5.2.  Lab Results  Component Value Date   HGBA1C 5.2 09/25/2018   Lab Results  Component Value Date   INSULIN 3.5 09/25/2018   INSULIN 4.3 01/29/2018   2. Other hyperlipidemia Hyperlipidemia is well-controlled on Crestor.  Lab Results  Component Value Date   ALT 20 09/25/2018   AST 26 09/25/2018   ALKPHOS 72 09/25/2018   BILITOT 0.3 09/25/2018   Lab Results  Component Value Date   CHOL 125 09/25/2018   HDL 57 09/25/2018   LDLCALC 54 09/25/2018   TRIG 66 09/25/2018   CHOLHDL 2 07/03/2017   3. Essential tremor Tremor is moderately well-controlled. She is propranolol but the dose had to be decreased due to hypotension.    4. Vitamin D deficiency [name]'s vitamin D level is not at goal at 38.3.  She is on prescription vitamin D every 3 days.  Her past bariatric surgery caused malabsorption.  5. Other depression, with emotional eating Stress eating is  well-controlled.  Assessment/Plan:   1. Prediabetes [name] will continue Ozempic and metformin.  Each of these medications have been refilled for her today.  Will also check A1c and insulin today. - Insulin, random - Hemoglobin A1c - Semaglutide,0.25 or 0.5MG /DOS, (OZEMPIC, 0.25 OR 0.5 MG/DOSE,) 2 MG/1.5ML SOPN; Inject 0.25 mg into the skin once a week.  Dispense: 3 pen; Refill: 0 - metFORMIN (GLUCOPHAGE) 500 MG tablet; Take 1 tablet (500 mg total) by mouth daily.  Dispense: 90 tablet; Refill: 0  2. Other hyperlipidemia [name] has agreed to continue taking Crestor, and a refill has been sent to her pharmacy for her. - rosuvastatin (CRESTOR) 20 MG tablet; Take 1 tablet (20 mg total) by mouth daily.  Dispense: 90 tablet; Refill: 0  3. Essential tremor [name] takes propranolol to help with her essential tremor, and I have sent in a refill for her today. - propranolol (INDERAL) 40 MG tablet; Take 1 tablet (40 mg total) by mouth daily.  Dispense: 90 tablet; Refill: 0  4. Vitamin D deficiency [name] will continue her prescription vitamin D.  Will also check her vitamin D level today. - VITAMIN D 25 Hydroxy (Vit-D Deficiency, Fractures)  5. Other depression, with emotional eating Will refill [name]'s Wellbutrin today to help continue to keep her stress eating under control. - buPROPion (WELLBUTRIN SR) 200 MG 12 hr tablet; Take 1 tablet (200 mg  total) by mouth daily.  Dispense: 90 tablet; Refill: 0  6. Class 1 obesity with serious comorbidity and body mass index (BMI) of 30.0 to 30.9 in adult, unspecified obesity type [name] is currently in the action stage of change. As such, her goal is to continue with weight loss efforts. She has agreed to practicing portion control and making smarter food choices, such as increasing vegetables and decreasing simple carbohydrates.   Exercise goals: As is.  Increase walking as weather improves.  Behavioral modification strategies: increasing lean protein intake,  decreasing simple carbohydrates, increasing water intake and planning for success.  [name] has agreed to follow-up with our clinic in 6 weeks. She was informed of the importance of frequent follow-up visits to maximize her success with intensive lifestyle modifications for her multiple health conditions.   [name] was informed we would discuss her lab results at her next visit unless there is a critical issue that needs to be addressed sooner. [name] agreed to keep her next visit at the agreed upon time to discuss these results.  Objective:   Blood pressure 103/64, pulse 62, temperature 98.1 F (36.7 C), temperature source Oral, height 4\' 11"  (1.499 m), weight 142 lb (64.4 kg), SpO2 97 %. Body mass index is 28.68 kg/m.  General: Cooperative, alert, well developed, in no acute distress. HEENT: Conjunctivae and lids unremarkable. Cardiovascular: Regular rhythm.  Lungs: Normal work of breathing. Neurologic: No focal deficits.   Lab Results  Component Value Date   CREATININE 0.82 09/25/2018   BUN 23 09/25/2018   NA 139 09/25/2018   K 4.1 09/25/2018   CL 102 09/25/2018   CO2 25 09/25/2018   Lab Results  Component Value Date   ALT 20 09/25/2018   AST 26 09/25/2018   ALKPHOS 72 09/25/2018   BILITOT 0.3 09/25/2018   Lab Results  Component Value Date   HGBA1C 5.2 09/25/2018   HGBA1C 5.7 (H) 01/29/2018   HGBA1C 5.9 07/03/2017   HGBA1C 5.9 09/06/2016   HGBA1C 5.8 03/14/2016   Lab Results  Component Value Date   INSULIN 3.5 09/25/2018   INSULIN 4.3 01/29/2018   Lab Results  Component Value Date   TSH 2.160 09/25/2018   Lab Results  Component Value Date   CHOL 125 09/25/2018   HDL 57 09/25/2018   LDLCALC 54 09/25/2018   TRIG 66 09/25/2018   CHOLHDL 2 07/03/2017   Lab Results  Component Value Date   WBC 5.5 09/25/2018   HGB 12.1 09/25/2018   HCT 38.0 09/25/2018   MCV 98 (H) 09/25/2018   PLT 201 09/25/2018   Lab Results  Component Value Date   IRON 71 09/25/2018    TIBC 314 09/25/2018   FERRITIN 29 09/25/2018   Obesity Behavioral Intervention Documentation for Insurance:   Approximately 15 minutes were spent on the discussion below.  ASK: We discussed the diagnosis of obesity with [facility] today and Dottie agreed to give [place] permission to discuss obesity behavioral modification therapy today.  ASSESS: Jaliza has the diagnosis of obesity and her BMI today is 28.8. Ermine is in the action stage of change.   ADVISE: Akela was educated on the multiple health risks of obesity as well as the benefit of weight loss to improve her health. She was advised of the need for long term treatment and the importance of lifestyle modifications to improve her current health and to decrease her risk of future health problems.  AGREE: Multiple dietary modification options and treatment options were discussed and  Nico agreed to follow the recommendations documented in the above note.  ARRANGE: Lilliauna was educated on the importance of frequent visits to treat obesity as outlined per CMS and USPSTF guidelines and agreed to schedule her next follow up appointment today.  Attestation Statements:   Reviewed by clinician on day of visit: allergies, medications, problem list, medical history, surgical history, family history, social history, and previous encounter notes.  I, Water quality scientist, CMA, am acting as Location manager for [employer], FNP-C.  I have reviewed the above documentation for accuracy and completeness, and I agree with the above. -  Georgianne Fick, FNP

## 2019-04-09 ENCOUNTER — Encounter (INDEPENDENT_AMBULATORY_CARE_PROVIDER_SITE_OTHER): Payer: Self-pay | Admitting: Family Medicine

## 2019-04-09 LAB — HEMOGLOBIN A1C
Est. average glucose Bld gHb Est-mCnc: 91 mg/dL
Hgb A1c MFr Bld: 4.8 % (ref 4.8–5.6)

## 2019-04-09 LAB — VITAMIN D 25 HYDROXY (VIT D DEFICIENCY, FRACTURES): Vit D, 25-Hydroxy: 49.4 ng/mL (ref 30.0–100.0)

## 2019-04-09 LAB — INSULIN, RANDOM: INSULIN: 3.4 u[IU]/mL (ref 2.6–24.9)

## 2019-04-20 ENCOUNTER — Other Ambulatory Visit (INDEPENDENT_AMBULATORY_CARE_PROVIDER_SITE_OTHER): Payer: Self-pay

## 2019-04-29 DIAGNOSIS — N3281 Overactive bladder: Secondary | ICD-10-CM | POA: Diagnosis not present

## 2019-05-06 ENCOUNTER — Ambulatory Visit: Payer: Medicare Other | Admitting: Cardiology

## 2019-05-11 ENCOUNTER — Other Ambulatory Visit (INDEPENDENT_AMBULATORY_CARE_PROVIDER_SITE_OTHER): Payer: Self-pay | Admitting: Family Medicine

## 2019-05-11 DIAGNOSIS — E7849 Other hyperlipidemia: Secondary | ICD-10-CM

## 2019-05-11 DIAGNOSIS — G250 Essential tremor: Secondary | ICD-10-CM

## 2019-05-13 ENCOUNTER — Ambulatory Visit: Payer: Medicare Other | Admitting: Family Medicine

## 2019-05-18 ENCOUNTER — Other Ambulatory Visit (INDEPENDENT_AMBULATORY_CARE_PROVIDER_SITE_OTHER): Payer: Self-pay | Admitting: Family Medicine

## 2019-05-18 DIAGNOSIS — Z136 Encounter for screening for cardiovascular disorders: Secondary | ICD-10-CM | POA: Diagnosis not present

## 2019-05-18 DIAGNOSIS — G4700 Insomnia, unspecified: Secondary | ICD-10-CM | POA: Diagnosis not present

## 2019-05-18 DIAGNOSIS — Z6830 Body mass index (BMI) 30.0-30.9, adult: Secondary | ICD-10-CM | POA: Diagnosis not present

## 2019-05-18 DIAGNOSIS — R7303 Prediabetes: Secondary | ICD-10-CM

## 2019-05-18 DIAGNOSIS — K469 Unspecified abdominal hernia without obstruction or gangrene: Secondary | ICD-10-CM | POA: Diagnosis not present

## 2019-05-20 ENCOUNTER — Encounter (INDEPENDENT_AMBULATORY_CARE_PROVIDER_SITE_OTHER): Payer: Self-pay | Admitting: Family Medicine

## 2019-05-20 ENCOUNTER — Other Ambulatory Visit (INDEPENDENT_AMBULATORY_CARE_PROVIDER_SITE_OTHER): Payer: Self-pay | Admitting: Family Medicine

## 2019-05-20 ENCOUNTER — Telehealth (INDEPENDENT_AMBULATORY_CARE_PROVIDER_SITE_OTHER): Payer: Self-pay | Admitting: Family Medicine

## 2019-05-20 ENCOUNTER — Ambulatory Visit (INDEPENDENT_AMBULATORY_CARE_PROVIDER_SITE_OTHER): Payer: Medicare Other | Admitting: Family Medicine

## 2019-05-20 DIAGNOSIS — G4709 Other insomnia: Secondary | ICD-10-CM

## 2019-05-20 NOTE — Telephone Encounter (Signed)
Refill request has been given to the provider

## 2019-05-27 DIAGNOSIS — I83893 Varicose veins of bilateral lower extremities with other complications: Secondary | ICD-10-CM | POA: Diagnosis not present

## 2019-06-03 ENCOUNTER — Ambulatory Visit (INDEPENDENT_AMBULATORY_CARE_PROVIDER_SITE_OTHER): Payer: Medicare Other | Admitting: Family Medicine

## 2019-06-08 ENCOUNTER — Other Ambulatory Visit: Payer: Self-pay | Admitting: Family Medicine

## 2019-06-08 DIAGNOSIS — K429 Umbilical hernia without obstruction or gangrene: Secondary | ICD-10-CM | POA: Diagnosis not present

## 2019-06-08 DIAGNOSIS — K469 Unspecified abdominal hernia without obstruction or gangrene: Secondary | ICD-10-CM | POA: Diagnosis not present

## 2019-06-08 DIAGNOSIS — Z9049 Acquired absence of other specified parts of digestive tract: Secondary | ICD-10-CM | POA: Diagnosis not present

## 2019-06-08 DIAGNOSIS — H6981 Other specified disorders of Eustachian tube, right ear: Secondary | ICD-10-CM

## 2019-06-08 DIAGNOSIS — K5900 Constipation, unspecified: Secondary | ICD-10-CM | POA: Diagnosis not present

## 2019-06-10 ENCOUNTER — Ambulatory Visit (INDEPENDENT_AMBULATORY_CARE_PROVIDER_SITE_OTHER): Payer: Medicare Other | Admitting: Family Medicine

## 2019-06-10 ENCOUNTER — Other Ambulatory Visit: Payer: Self-pay

## 2019-06-10 ENCOUNTER — Encounter (INDEPENDENT_AMBULATORY_CARE_PROVIDER_SITE_OTHER): Payer: Self-pay | Admitting: Family Medicine

## 2019-06-10 VITALS — BP 114/52 | HR 50 | Temp 98.4°F | Ht 59.0 in | Wt 145.0 lb

## 2019-06-10 DIAGNOSIS — Z6830 Body mass index (BMI) 30.0-30.9, adult: Secondary | ICD-10-CM | POA: Diagnosis not present

## 2019-06-10 DIAGNOSIS — E8881 Metabolic syndrome: Secondary | ICD-10-CM

## 2019-06-10 DIAGNOSIS — F3289 Other specified depressive episodes: Secondary | ICD-10-CM

## 2019-06-10 DIAGNOSIS — E669 Obesity, unspecified: Secondary | ICD-10-CM

## 2019-06-10 MED ORDER — BUPROPION HCL ER (SR) 200 MG PO TB12: 200.0000 mg | ORAL_TABLET | Freq: Every day | ORAL | 0 refills | Status: DC

## 2019-06-10 MED ORDER — METFORMIN HCL 500 MG PO TABS: 500.0000 mg | ORAL_TABLET | Freq: Every day | ORAL | 0 refills | Status: DC

## 2019-06-10 MED ORDER — OZEMPIC (0.25 OR 0.5 MG/DOSE) 2 MG/1.5ML ~~LOC~~ SOPN: 0.2500 mg | PEN_INJECTOR | SUBCUTANEOUS | 0 refills | Status: DC

## 2019-06-10 NOTE — Progress Notes (Signed)
Chief Complaint:   OBESITY [name] is here to discuss her progress with her obesity treatment plan along with follow-up of her obesity related diagnoses. [name] is on practicing portion control and making smarter food choices, such as increasing vegetables and decreasing simple carbohydrates and states she is following her eating plan approximately 80% of the time. [name] states she is walking for 30 minutes 2 times per week.  Today's visit was #: 78  Starting weight: 186 lbs Starting date: 01/28/2018 Today's weight: 145 lbs Today's date: 06/10/2019 Total lbs lost to date: 41 Total lbs lost since last in-office visit: 0  Interim History: [name] is not always getting her protein in at all meals. She does drink 2 cans of Coke per day. She is at her goal weight.  Subjective:   1. Insulin resistance [name] has a diagnosis of insulin resistance based on her elevated fasting insulin level >5. She denies polyphagia. She was previously diabetic before her duodenal switch. She continues to work on diet and exercise to decrease her risk of diabetes.  Lab Results  Component Value Date   INSULIN 3.4 04/08/2019   INSULIN 3.5 09/25/2018   INSULIN 4.3 01/29/2018   Lab Results  Component Value Date   HGBA1C 4.8 04/08/2019   2. Other depression, with emotional eating [name] feels that bupropion helps with cravings.  Assessment/Plan:   1. Insulin resistance [name] will continue to work on weight loss, exercise, and decreasing simple carbohydrates to help decrease the risk of diabetes. We will refill Ozempic and metformin for 1 month. [name] agreed to follow-up with [place] as directed to closely monitor her progress.  - Semaglutide,0.25 or 0.5MG /DOS, (OZEMPIC, 0.25 OR 0.5 MG/DOSE,) 2 MG/1.5ML SOPN; Inject 0.25 mg into the skin once a week.  Dispense: 3 pen; Refill: 0 - metFORMIN (GLUCOPHAGE) 500 MG tablet; Take 1 tablet (500 mg total) by mouth daily.  Dispense: 90 tablet; Refill: 0  2. Other depression, with  emotional eating Behavior modification techniques were discussed today to help [name] deal with her emotional/non-hunger eating behaviors. We will refill bupropion for 1 month. Orders and follow up as documented in patient record.   - buPROPion (WELLBUTRIN SR) 200 MG 12 hr tablet; Take 1 tablet (200 mg total) by mouth daily.  Dispense: 90 tablet; Refill: 0  3. Class 1 obesity with serious comorbidity and body mass index (BMI) of 30.0 to 30.9 in adult, unspecified obesity type [name] is currently in the action stage of change. As such, her goal is to continue with weight loss efforts. She has agreed to practicing portion control and making smarter food choices, such as increasing vegetables and decreasing simple carbohydrates.   Exercise goals: [name] should follow the adult guidelines. When [name] cannot meet the adult guidelines, they should be as physically active as their abilities and conditions will allow.   Behavioral modification strategies: increasing lean protein intake, decreasing liquid calories and better snacking choices.  [name] has agreed to follow-up with our clinic in 5 weeks. She was informed of the importance of frequent follow-up visits to maximize her success with intensive lifestyle modifications for her multiple health conditions.   Objective:   Blood pressure (!) 114/52, pulse (!) 50, temperature 98.4 F (36.9 C), temperature source Oral, height 4\' 11"  (1.499 m), weight 145 lb (65.8 kg), SpO2 95 %. Body mass index is 29.29 kg/m.  General: Cooperative, alert, well developed, in no acute distress. HEENT: Conjunctivae and lids unremarkable. Cardiovascular: Regular rhythm.  Lungs:  Normal work of breathing. Neurologic: No focal deficits.   Lab Results  Component Value Date   CREATININE 0.8 03/10/2019   BUN 22 (A) 03/10/2019   NA 136 (A) 03/10/2019   K 4.2 03/10/2019   CL 100 03/10/2019   CO2 29 (A) 03/10/2019   Lab Results  Component Value Date   ALT 20  09/25/2018   AST 26 09/25/2018   ALKPHOS 72 09/25/2018   BILITOT 0.3 09/25/2018   Lab Results  Component Value Date   HGBA1C 4.8 04/08/2019   HGBA1C 5.2 09/25/2018   HGBA1C 5.7 (H) 01/29/2018   HGBA1C 5.9 07/03/2017   HGBA1C 5.9 09/06/2016   Lab Results  Component Value Date   INSULIN 3.4 04/08/2019   INSULIN 3.5 09/25/2018   INSULIN 4.3 01/29/2018   Lab Results  Component Value Date   TSH 2.160 09/25/2018   Lab Results  Component Value Date   CHOL 125 09/25/2018   HDL 57 09/25/2018   LDLCALC 54 09/25/2018   TRIG 66 09/25/2018   CHOLHDL 2 07/03/2017   Lab Results  Component Value Date   WBC 5.2 03/10/2019   HGB 12.3 03/10/2019   HCT 37 03/10/2019   MCV 98 (H) 09/25/2018   PLT 136 (A) 03/10/2019   Lab Results  Component Value Date   IRON 71 09/25/2018   TIBC 314 09/25/2018   FERRITIN 29 09/25/2018    Obesity Behavioral Intervention Documentation for Insurance:   Approximately 15 minutes were spent on the discussion below.  ASK: We discussed the diagnosis of obesity with [name] today and [name] agreed to give [place] permission to discuss obesity behavioral modification therapy today.  ASSESS: [name] has the diagnosis of obesity and her BMI today is 29.27. [name] is in the action stage of change.   ADVISE: [name] was educated on the multiple health risks of obesity as well as the benefit of weight loss to improve her health. She was advised of the need for long term treatment and the importance of lifestyle modifications to improve her current health and to decrease her risk of future health problems.  AGREE: Multiple dietary modification options and treatment options were discussed and [name] agreed to follow the recommendations documented in the above note.  ARRANGE: [name] was educated on the importance of frequent visits to treat obesity as outlined per CMS and USPSTF guidelines and agreed to schedule her next follow up appointment today.  Attestation Statements:    Reviewed by clinician on day of visit: allergies, medications, problem list, medical history, surgical history, family history, social history, and previous encounter notes.   [name], am acting as Location manager for [employer], FNP-C.  I have reviewed the above documentation for accuracy and completeness, and I agree with the above. -  Gail Fick, FNP

## 2019-06-11 ENCOUNTER — Encounter (INDEPENDENT_AMBULATORY_CARE_PROVIDER_SITE_OTHER): Payer: Self-pay | Admitting: Family Medicine

## 2019-06-17 DIAGNOSIS — I83891 Varicose veins of right lower extremities with other complications: Secondary | ICD-10-CM | POA: Diagnosis not present

## 2019-06-24 DIAGNOSIS — K469 Unspecified abdominal hernia without obstruction or gangrene: Secondary | ICD-10-CM | POA: Diagnosis not present

## 2019-06-24 DIAGNOSIS — I959 Hypotension, unspecified: Secondary | ICD-10-CM | POA: Diagnosis not present

## 2019-06-24 DIAGNOSIS — Z6829 Body mass index (BMI) 29.0-29.9, adult: Secondary | ICD-10-CM | POA: Diagnosis not present

## 2019-06-24 DIAGNOSIS — L0291 Cutaneous abscess, unspecified: Secondary | ICD-10-CM | POA: Diagnosis not present

## 2019-06-24 DIAGNOSIS — R109 Unspecified abdominal pain: Secondary | ICD-10-CM | POA: Diagnosis not present

## 2019-07-01 DIAGNOSIS — I83892 Varicose veins of left lower extremities with other complications: Secondary | ICD-10-CM | POA: Diagnosis not present

## 2019-07-13 ENCOUNTER — Ambulatory Visit (INDEPENDENT_AMBULATORY_CARE_PROVIDER_SITE_OTHER): Payer: Medicare Other | Admitting: Internal Medicine

## 2019-07-13 ENCOUNTER — Other Ambulatory Visit: Payer: Self-pay

## 2019-07-13 ENCOUNTER — Encounter: Payer: Self-pay | Admitting: Internal Medicine

## 2019-07-13 ENCOUNTER — Telehealth: Payer: Self-pay | Admitting: Internal Medicine

## 2019-07-13 DIAGNOSIS — G4733 Obstructive sleep apnea (adult) (pediatric): Secondary | ICD-10-CM | POA: Diagnosis not present

## 2019-07-13 DIAGNOSIS — E669 Obesity, unspecified: Secondary | ICD-10-CM | POA: Diagnosis not present

## 2019-07-13 DIAGNOSIS — Z6831 Body mass index (BMI) 31.0-31.9, adult: Secondary | ICD-10-CM

## 2019-07-13 MED ORDER — IPRATROPIUM BROMIDE 0.03 % NA SOLN: 2.0000 | Freq: Two times a day (BID) | NASAL | 12 refills | Status: DC

## 2019-07-13 NOTE — Telephone Encounter (Signed)
Spoke with [place] at [facility]. States they reached out to the pt to try to get an updated download. From looking in [place], it looks like the pt just stopped wearing the CPAP. Pt has a pending OV with [facility], we will talk with her then about this.

## 2019-07-13 NOTE — Progress Notes (Signed)
Subjective:    Patient ID: [name], female    DOB: 1945/03/01, 74 y.o.   MRN: [id number]  HPI female never smoker followed for allergic rhinitis, asthma, OSA, Narcolepsy, complicated by GERD, DM, HBP, obesity/ bariatric surgery, pacemaker PFT 11/24/07- WNL FEV1/FVC 0.87 NPSG 03/23/1999- AHI 156/ hr, weight 202lbs before bariatric surgery, CPAP recently 12. [facility] screen 07/24/12- AHI 2.0/ hr, weight 209 lbs NPSG 01/25/13- AHI 7.6/ hr, mild OSA, weight 213 lbs. MSLT 01/26/13- mean latency 0.9 minutes, SOREM 1/5 naps. Pathologic daytime sleepiness consistent with narcolepsy or idiopathic, but nonspecific because she had taken Klonopin and Lamictal the night before, Cymbalta and Xyzal the day of the test.  ----------------------------------------------------------------------------------  07/16/2018- 74 year old female never smoker followed for allergic rhinitis, asthma, OSA/Narcolepsy, complicated by GERD, DM, HBP, obesity/bariatric surgery, pacemaker CPAP auto 5-20/ [facility] Download compliance 83%, AHI 7.1/ hr Body weight today 161 lbs (was 175 lb in Feb) -----OSA on CPAP; DME: [facility], pt report increased air from machine and states it is uncomfortable for her. Working with a medical weight loss program and has dieted off 14 lbs. Mask leaking more and pressure seems too high, although using most of the current range. Persistent post nasal drip sensation on Xyzal/ Flonase. No asthma at all in a long time, well controlled and better when she stays at the [facility].  07/13/19- 74 year old female never smoker followed for allergic rhinitis, asthma, OSA, complicated by GERD, DM, HBP, obesity/bariatric surgery, pacemaker CPAP auto 5-20/ [facility]- no longer using after weight loss Body weight today - 143 lbs   [facility] Has lost 60 lbs Trazodone,  No longer snoring or daytime sleepiness after weight loss Now living in [place]. Has runny nose when  visiting [place].  Review of Systems-see HPI + = positive Constitutional:   +  weight loss, night sweats, fevers, chills, +fatigue, lassitude. HEENT:   No-  headaches, difficulty swallowing, tooth/dental problems, sore throat,       No-  sneezing, itching, ear ache,  nasal congestion, +post nasal drip,  CV:  No-   chest pain, orthopnea, PND, swelling in lower extremities, anasarca, dizziness, palpitations Resp: No-   shortness of breath with exertion or at rest.              No- productive cough,   non-productive cough,  No-  coughing up of blood.              No-   change in color of mucus.  No- wheezing.   Skin: No-   rash or lesions. GI:  No-   heartburn, indigestion, abdominal pain, nausea, vomiting, GU:  MS:  No-   joint pain or swelling.   Neuro- nothing unusual Psych:  No- change in mood or affect. No depression or anxiety.  No memory loss.    Objective:   Physical Exam General- Alert, Oriented, Affect-appropriate, Distress- none acute,  Skin- rash-none, lesions- none, excoriation-none Lymphadenopathy- none Head- atraumatic            Eyes- Gross vision intact, PERRLA, conjunctivae clear, with watery thin secretions            Ears- Hearing, canals normal, TMs normal            Nose- Clear, No- Septal dev, mucus, polyps, erosion, perforation,             Throat- Mallampati III , mucosa clear , drainage- none, tonsils- atrophic Neck- flexible , trachea midline, no stridor ,  thyroid nl, carotid no bruit Chest - symmetrical excursion , unlabored           Heart/CV- RRR , +trace systolic murmur? TI or AS , no gallop  , no rub, nl s1s2                           - JVD- none , edema- none, stasis changes- none, varices- right calf           Lung-  Clear chest, Cough-none, wheeze- none , dullness-none, rub- none           Chest wall-  Abd-  Br/ Gen/ Rectal- Not done, not indicated Extrem- cyanosis- none, clubbing, none, atrophy- none, strength- nl Neuro- grossly intact to  observation  Assessment & Plan:

## 2019-07-13 NOTE — Patient Instructions (Signed)
Really glad that you are doing so well off CPAP.  Script sent for ipratropium nasal spray to try for watery nose if needed.  Please call if we can help

## 2019-07-14 ENCOUNTER — Ambulatory Visit (INDEPENDENT_AMBULATORY_CARE_PROVIDER_SITE_OTHER): Payer: Medicare Other | Admitting: Family Medicine

## 2019-07-14 ENCOUNTER — Encounter (INDEPENDENT_AMBULATORY_CARE_PROVIDER_SITE_OTHER): Payer: Self-pay | Admitting: Family Medicine

## 2019-07-14 VITALS — BP 91/55 | HR 55 | Temp 98.2°F | Ht 59.0 in | Wt 139.0 lb

## 2019-07-14 DIAGNOSIS — E559 Vitamin D deficiency, unspecified: Secondary | ICD-10-CM

## 2019-07-14 DIAGNOSIS — F3289 Other specified depressive episodes: Secondary | ICD-10-CM

## 2019-07-14 DIAGNOSIS — E8881 Metabolic syndrome: Secondary | ICD-10-CM | POA: Diagnosis not present

## 2019-07-14 DIAGNOSIS — Z6830 Body mass index (BMI) 30.0-30.9, adult: Secondary | ICD-10-CM | POA: Diagnosis not present

## 2019-07-14 DIAGNOSIS — E669 Obesity, unspecified: Secondary | ICD-10-CM

## 2019-07-14 MED ORDER — BUPROPION HCL ER (SR) 200 MG PO TB12: 200.0000 mg | ORAL_TABLET | Freq: Every day | ORAL | 0 refills | Status: DC

## 2019-07-14 MED ORDER — METFORMIN HCL 500 MG PO TABS: 500.0000 mg | ORAL_TABLET | Freq: Every day | ORAL | 0 refills | Status: DC

## 2019-07-14 MED ORDER — VITAMIN D (ERGOCALCIFEROL) 1.25 MG (50000 UNIT) PO CAPS: 50000.0000 [IU] | ORAL_CAPSULE | ORAL | 0 refills | Status: DC

## 2019-07-16 NOTE — Progress Notes (Signed)
Chief Complaint:   OBESITY [name] is here to discuss her progress with her obesity treatment plan along with follow-up of her obesity related diagnoses. [name] is on practicing portion control and making smarter food choices, such as increasing vegetables and decreasing simple carbohydrates and states she is following her eating plan approximately 90% of the time. [name] states she is doing 0 minutes 0 times per week.  Today's visit was #: 28 Starting weight: 186 lbs Starting date: 01/28/2018 Today's weight: 139 lbs Today's date: 07/14/2019 Total lbs lost to date: 47 Total lbs lost since last in-office visit: 6  Interim History: [name] is at her goal weight and is working on weight maintenance. She is packing up her house in [place] to sell and has been very active recently. She is making sure she gets protein in.  Subjective:   1. Insulin resistance [name]'s appetite is well controlled with Ozempic and metformin. She denies nausea. She continues to work on diet and exercise to decrease her risk of diabetes.  Lab Results  Component Value Date   INSULIN 3.4 04/08/2019   INSULIN 3.5 09/25/2018   INSULIN 4.3 01/29/2018   Lab Results  Component Value Date   HGBA1C 4.8 04/08/2019   2. Vitamin D deficiency [name]'s Vit D level is at goal on 50,000 IU Vit D every 3 days.  3. Other depression, with emotional eating [name] is on bupropion and she feels this helps with emotional eating.  Assessment/Plan:   1. Insulin resistance [name] will continue to work on weight loss, exercise, and decreasing simple carbohydrates to help decrease the risk of diabetes. We will refill metformin for 90 days with no refills. [name] agreed to follow-up with [place] as directed to closely monitor her progress.  - metFORMIN (GLUCOPHAGE) 500 MG tablet; Take 1 tablet (500 mg total) by mouth daily.  Dispense: 90 tablet; Refill: 0  2. Vitamin D deficiency Low Vitamin D level contributes to fatigue and are associated with  obesity, breast, and colon cancer. We will refill prescription Vitamin D for 90 days with no refills. [name] will follow-up for routine testing of Vitamin D, at least 2-3 times per year to avoid over-replacement.  - Vitamin D, Ergocalciferol, (DRISDOL) 1.25 MG (50000 UNIT) CAPS capsule; Take 1 capsule (50,000 Units total) by mouth every 3 (three) days.  Dispense: 30 capsule; Refill: 0  3. Other depression, with emotional eating Behavior modification techniques were discussed today to help [name] deal with her emotional/non-hunger eating behaviors. We will refill bupropion for 90 days with no refills. Orders and follow up as documented in patient record.   - buPROPion (WELLBUTRIN SR) 200 MG 12 hr tablet; Take 1 tablet (200 mg total) by mouth daily.  Dispense: 90 tablet; Refill: 0  4. Class 1 obesity with serious comorbidity and body mass index (BMI) of 30.0 to 30.9 in adult, unspecified obesity type [name] is currently in the action stage of change. As such, her goal is to maintain weight for now. She has agreed to practicing portion control and making smarter food choices, such as increasing vegetables and decreasing simple carbohydrates.   Exercise goals: No exercise has been prescribed at this time.  Behavioral modification strategies: increasing lean protein intake and no skipping meals.  [name] has agreed to follow-up with our clinic in 3 months.  Objective:   Blood pressure (!) 91/55, pulse (!) 55, temperature 98.2 F (36.8 C), temperature source Oral, height 4\' 11"  (1.499 m), weight 139 lb (63 kg), SpO2  98 %. Body mass index is 28.07 kg/m.  General: Cooperative, alert, well developed, in no acute distress. HEENT: Conjunctivae and lids unremarkable. Cardiovascular: Regular rhythm.  Lungs: Normal work of breathing. Neurologic: No focal deficits.   Lab Results  Component Value Date   CREATININE 0.8 03/10/2019   BUN 22 (A) 03/10/2019   NA 136 (A) 03/10/2019   K 4.2 03/10/2019   CL 100  03/10/2019   CO2 29 (A) 03/10/2019   Lab Results  Component Value Date   ALT 20 09/25/2018   AST 26 09/25/2018   ALKPHOS 72 09/25/2018   BILITOT 0.3 09/25/2018   Lab Results  Component Value Date   HGBA1C 4.8 04/08/2019   HGBA1C 5.2 09/25/2018   HGBA1C 5.7 (H) 01/29/2018   HGBA1C 5.9 07/03/2017   HGBA1C 5.9 09/06/2016   Lab Results  Component Value Date   INSULIN 3.4 04/08/2019   INSULIN 3.5 09/25/2018   INSULIN 4.3 01/29/2018   Lab Results  Component Value Date   TSH 2.160 09/25/2018   Lab Results  Component Value Date   CHOL 125 09/25/2018   HDL 57 09/25/2018   LDLCALC 54 09/25/2018   TRIG 66 09/25/2018   CHOLHDL 2 07/03/2017   Lab Results  Component Value Date   WBC 5.2 03/10/2019   HGB 12.3 03/10/2019   HCT 37 03/10/2019   MCV 98 (H) 09/25/2018   PLT 136 (A) 03/10/2019   Lab Results  Component Value Date   IRON 71 09/25/2018   TIBC 314 09/25/2018   FERRITIN 29 09/25/2018    Obesity Behavioral Intervention Documentation for Insurance:   Approximately 15 minutes were spent on the discussion below.  ASK: We discussed the diagnosis of obesity with [name] today and [name] agreed to give [place] permission to discuss obesity behavioral modification therapy today.  ASSESS: [name] has the diagnosis of obesity and her BMI today is 28.06. [name] is in the action stage of change.   ADVISE: [name] was educated on the multiple health risks of obesity as well as the benefit of weight loss to improve her health. She was advised of the need for long term treatment and the importance of lifestyle modifications to improve her current health and to decrease her risk of future health problems.  AGREE: Multiple dietary modification options and treatment options were discussed and [name] agreed to follow the recommendations documented in the above note.  ARRANGE: [name] was educated on the importance of frequent visits to treat obesity as outlined per CMS and USPSTF guidelines and  agreed to schedule her next follow up appointment today.  Attestation Statements:   Reviewed by clinician on day of visit: allergies, medications, problem list, medical history, surgical history, family history, social history, and previous encounter notes.   [name], am acting as Location manager for [employer], FNP-C.  I have reviewed the above documentation for accuracy and completeness, and I agree with the above. -  Georgianne Fick, FNP

## 2019-07-20 ENCOUNTER — Encounter (INDEPENDENT_AMBULATORY_CARE_PROVIDER_SITE_OTHER): Payer: Self-pay | Admitting: Family Medicine

## 2019-07-20 DIAGNOSIS — E8881 Metabolic syndrome: Secondary | ICD-10-CM | POA: Insufficient documentation

## 2019-07-20 DIAGNOSIS — E88819 Insulin resistance, unspecified: Secondary | ICD-10-CM | POA: Insufficient documentation

## 2019-08-25 DIAGNOSIS — I83891 Varicose veins of right lower extremities with other complications: Secondary | ICD-10-CM | POA: Diagnosis not present

## 2019-09-13 ENCOUNTER — Other Ambulatory Visit (INDEPENDENT_AMBULATORY_CARE_PROVIDER_SITE_OTHER): Payer: Self-pay | Admitting: Family Medicine

## 2019-09-13 DIAGNOSIS — E8881 Metabolic syndrome: Secondary | ICD-10-CM

## 2019-10-05 DIAGNOSIS — Z20822 Contact with and (suspected) exposure to covid-19: Secondary | ICD-10-CM | POA: Diagnosis not present

## 2019-10-08 ENCOUNTER — Encounter: Payer: Self-pay | Admitting: Internal Medicine

## 2019-10-08 NOTE — Assessment & Plan Note (Signed)
Resolved with no more sleepiness or snoring after significant weight loss. Plan- ok to stay off CPAP if she can keep weiht down.

## 2019-10-08 NOTE — Assessment & Plan Note (Signed)
Substantial weight loss she credits to [employer]

## 2019-10-14 ENCOUNTER — Ambulatory Visit (INDEPENDENT_AMBULATORY_CARE_PROVIDER_SITE_OTHER): Payer: Medicare Other | Admitting: Family Medicine

## 2019-10-14 ENCOUNTER — Encounter (INDEPENDENT_AMBULATORY_CARE_PROVIDER_SITE_OTHER): Payer: Self-pay | Admitting: Family Medicine

## 2019-10-14 ENCOUNTER — Other Ambulatory Visit: Payer: Self-pay

## 2019-10-14 VITALS — BP 102/61 | HR 52 | Temp 97.9°F | Ht 59.0 in | Wt 135.0 lb

## 2019-10-14 DIAGNOSIS — E11A Type 2 diabetes mellitus without complications in remission: Secondary | ICD-10-CM

## 2019-10-14 DIAGNOSIS — N3281 Overactive bladder: Secondary | ICD-10-CM | POA: Diagnosis not present

## 2019-10-14 DIAGNOSIS — E7849 Other hyperlipidemia: Secondary | ICD-10-CM | POA: Diagnosis not present

## 2019-10-14 DIAGNOSIS — E669 Obesity, unspecified: Secondary | ICD-10-CM

## 2019-10-14 DIAGNOSIS — Z6830 Body mass index (BMI) 30.0-30.9, adult: Secondary | ICD-10-CM | POA: Diagnosis not present

## 2019-10-14 DIAGNOSIS — K912 Postsurgical malabsorption, not elsewhere classified: Secondary | ICD-10-CM | POA: Diagnosis not present

## 2019-10-14 DIAGNOSIS — E119 Type 2 diabetes mellitus without complications: Secondary | ICD-10-CM | POA: Diagnosis not present

## 2019-10-14 DIAGNOSIS — F3289 Other specified depressive episodes: Secondary | ICD-10-CM

## 2019-10-14 DIAGNOSIS — E559 Vitamin D deficiency, unspecified: Secondary | ICD-10-CM

## 2019-10-14 DIAGNOSIS — E66811 Obesity, class 1: Secondary | ICD-10-CM

## 2019-10-14 MED ORDER — BUPROPION HCL ER (SR) 200 MG PO TB12: 200.0000 mg | ORAL_TABLET | Freq: Every day | ORAL | 0 refills | Status: DC

## 2019-10-14 MED ORDER — ROSUVASTATIN CALCIUM 20 MG PO TABS: 20.0000 mg | ORAL_TABLET | Freq: Every day | ORAL | 0 refills | Status: DC

## 2019-10-14 MED ORDER — METFORMIN HCL 500 MG PO TABS: 500.0000 mg | ORAL_TABLET | Freq: Every day | ORAL | 0 refills | Status: DC

## 2019-10-14 MED ORDER — VITAMIN D (ERGOCALCIFEROL) 1.25 MG (50000 UNIT) PO CAPS: 50000.0000 [IU] | ORAL_CAPSULE | ORAL | 0 refills | Status: DC

## 2019-10-14 MED ORDER — OZEMPIC (0.25 OR 0.5 MG/DOSE) 2 MG/1.5ML ~~LOC~~ SOPN: 0.2500 mg | PEN_INJECTOR | SUBCUTANEOUS | 0 refills | Status: DC

## 2019-10-15 ENCOUNTER — Encounter (INDEPENDENT_AMBULATORY_CARE_PROVIDER_SITE_OTHER): Payer: Self-pay | Admitting: Family Medicine

## 2019-10-15 DIAGNOSIS — E119 Type 2 diabetes mellitus without complications: Secondary | ICD-10-CM | POA: Diagnosis not present

## 2019-10-15 DIAGNOSIS — K912 Postsurgical malabsorption, not elsewhere classified: Secondary | ICD-10-CM | POA: Diagnosis not present

## 2019-10-15 DIAGNOSIS — E559 Vitamin D deficiency, unspecified: Secondary | ICD-10-CM | POA: Diagnosis not present

## 2019-10-15 DIAGNOSIS — E7849 Other hyperlipidemia: Secondary | ICD-10-CM | POA: Diagnosis not present

## 2019-10-15 NOTE — Progress Notes (Signed)
Chief Complaint:   OBESITY [name] is here to discuss her progress with her obesity treatment plan along with follow-up of her obesity related diagnoses. [name] is on practicing portion control and making smarter food choices, such as increasing vegetables and decreasing simple carbohydrates and states she is following her eating plan approximately 100% of the time. [name] states she is active while keeping her grandson.   Today's visit was #: 69 Starting weight: 186 lbs Starting date: 01/28/2018 Today's weight: 135 lbs Today's date: 10/14/2019 Total lbs lost to date: 51 Total lbs lost since last in-office visit: 4  Interim History: [name] has lost 4 lbs. She is on weight maintenance. Her weight loss goal range is between 125 lbs-135 lbs. She reports getting in adequate protein. She feels she has broken her habit of habitual eating. She does not deprive herself but she has small portions of foods that she wants.  Subjective:   1. Type 2 diabetes mellitus in remission ([place]) [name] is on metformin. She was a diabetic before duodenal switch in 2003. She is also on Ozempic which she feels helps a good deal with weight maintenance.  Lab Results  Component Value Date   HGBA1C 4.8 04/08/2019   HGBA1C 5.2 09/25/2018   HGBA1C 5.7 (H) 01/29/2018   Lab Results  Component Value Date   MICROALBUR <0.7 07/03/2017   LDLCALC 54 09/25/2018   CREATININE 0.8 03/10/2019   Lab Results  Component Value Date   INSULIN 3.4 04/08/2019   INSULIN 3.5 09/25/2018   INSULIN 4.3 01/29/2018   2. Other hyperlipidemia [name]'s lipid profile was within normal limits 1 year ago. She is on Crestor 20 mg. She denies any chest pain, claudication or myalgias.   Lab Results  Component Value Date   ALT 20 09/25/2018   AST 26 09/25/2018   ALKPHOS 72 09/25/2018   BILITOT 0.3 09/25/2018   Lab Results  Component Value Date   CHOL 125 09/25/2018   HDL 57 09/25/2018   LDLCALC 54 09/25/2018   TRIG 66 09/25/2018    CHOLHDL 2 07/03/2017   3. Postoperative intestinal malabsorption [name] is status post duodenal switch in 2003.  4. Vitamin D deficiency [name]'s last Vit D was nearly at goal at 40, and maintained on Vit D 50,000 IU every 3 days.  5. Other depression, with emotional eating [name]'s mood is stable on bupropion and Cymbalta. She has occasional cravings but overall well controlled.  Assessment/Plan:   1. Type 2 diabetes mellitus in remission [facility]) We will check labs today. [name] will continue her medications, and we will refill Ozempic and metformin for 90 days, with no refill.  - Comprehensive metabolic panel - Hemoglobin A1c - Insulin, random - metFORMIN (GLUCOPHAGE) 500 MG tablet; Take 1 tablet (500 mg total) by mouth daily.  Dispense: 90 tablet; Refill: 0 - Semaglutide,0.25 or 0.5MG /DOS, (OZEMPIC, 0.25 OR 0.5 MG/DOSE,) 2 MG/1.5ML SOPN; Inject 0.25 mg into the skin once a week.  Dispense: 4.5 mL; Refill: 0  2. Other hyperlipidemia Cardiovascular risk and specific lipid/LDL goals reviewed. We discussed several lifestyle modifications today and Diedra will continue to work on diet, exercise and weight loss efforts. We will check labs today, and we will refill Crestor for 90 days, with no refill.  - Lipid Panel With LDL/HDL Ratio - rosuvastatin (CRESTOR) 20 MG tablet; Take 1 tablet (20 mg total) by mouth daily.  Dispense: 90 tablet; Refill: 0  3. Postoperative intestinal malabsorption We will check labs today, and [name]  will continue to follow up as directed.  - Folate - Iron and TIBC - Ferritin - Vitamin B12 - CBC with Differential/Platelet - Vitamin B6 - Vitamin B1  4. Vitamin D deficiency Low Vitamin D level contributes to fatigue and are associated with obesity, breast, and colon cancer. We will check labs today, and we will refill prescription Vitamin D for 90 days, with no refill. [name] will follow-up for routine testing of Vitamin D, at least 2-3 times per year to avoid  over-replacement.  - VITAMIN D 25 Hydroxy (Vit-D Deficiency, Fractures) - Vitamin D, Ergocalciferol, (DRISDOL) 1.25 MG (50000 UNIT) CAPS capsule; Take 1 capsule (50,000 Units total) by mouth every 3 (three) days.  Dispense: 30 capsule; Refill: 0  5. Other depression, with emotional eating Behavior modification techniques were discussed today to help [name] deal with her emotional/non-hunger eating behaviors. We will refill bupropion for 90 days, with no refill. Orders and follow up as documented in patient record.   - buPROPion (WELLBUTRIN SR) 200 MG 12 hr tablet; Take 1 tablet (200 mg total) by mouth daily.  Dispense: 90 tablet; Refill: 0  6. Class 1 obesity with serious comorbidity and body mass index (BMI) of 30.0 to 30.9 in adult, unspecified obesity type [name] is currently in the action stage of change. As such, her goal is to maintain weight. She has agreed to practicing portion control and making smarter food choices, such as increasing vegetables and decreasing simple carbohydrates.   Exercise goals: As is.  Behavioral modification strategies: planning for success.  [name] has agreed to follow-up with our clinic in 3 months. [name] was informed we would discuss her lab results at her next visit unless there is a critical issue that needs to be addressed sooner. [name] agreed to keep her next visit at the agreed upon time to discuss these results.  Objective:   Blood pressure 102/61, pulse (!) 52, temperature 97.9 F (36.6 C), temperature source Oral, height 4\' 11"  (1.499 m), weight 135 lb (61.2 kg), SpO2 99 %. Body mass index is 27.27 kg/m.  General: Cooperative, alert, well developed, in no acute distress. HEENT: Conjunctivae and lids unremarkable. Cardiovascular: Regular rhythm.  Lungs: Normal work of breathing. Neurologic: No focal deficits.   Lab Results  Component Value Date   CREATININE 0.8 03/10/2019   BUN 22 (A) 03/10/2019   NA 136 (A) 03/10/2019   K 4.2 03/10/2019   CL  100 03/10/2019   CO2 29 (A) 03/10/2019   Lab Results  Component Value Date   ALT 20 09/25/2018   AST 26 09/25/2018   ALKPHOS 72 09/25/2018   BILITOT 0.3 09/25/2018   Lab Results  Component Value Date   HGBA1C 4.8 04/08/2019   HGBA1C 5.2 09/25/2018   HGBA1C 5.7 (H) 01/29/2018   HGBA1C 5.9 07/03/2017   HGBA1C 5.9 09/06/2016   Lab Results  Component Value Date   INSULIN 3.4 04/08/2019   INSULIN 3.5 09/25/2018   INSULIN 4.3 01/29/2018   Lab Results  Component Value Date   TSH 2.160 09/25/2018   Lab Results  Component Value Date   CHOL 125 09/25/2018   HDL 57 09/25/2018   LDLCALC 54 09/25/2018   TRIG 66 09/25/2018   CHOLHDL 2 07/03/2017   Lab Results  Component Value Date   WBC 5.2 03/10/2019   HGB 12.3 03/10/2019   HCT 37 03/10/2019   MCV 98 (H) 09/25/2018   PLT 136 (A) 03/10/2019   Lab Results  Component Value Date  IRON 71 09/25/2018   TIBC 314 09/25/2018   FERRITIN 29 09/25/2018    Obesity Behavioral Intervention:   Approximately 15 minutes were spent on the discussion below.  ASK: We discussed the diagnosis of obesity with [name] today and [name] agreed to give [place] permission to discuss obesity behavioral modification therapy today.  ASSESS: [name] has the diagnosis of obesity and her BMI today is 27.25. Yetta is in the action stage of change.   ADVISE: [name] was educated on the multiple health risks of obesity as well as the benefit of weight loss to improve her health. She was advised of the need for long term treatment and the importance of lifestyle modifications to improve her current health and to decrease her risk of future health problems.  AGREE: Multiple dietary modification options and treatment options were discussed and Sairah agreed to follow the recommendations documented in the above note.  ARRANGE: [name] was educated on the importance of frequent visits to treat obesity as outlined per CMS and USPSTF guidelines and agreed to schedule her next  follow up appointment today.  Attestation Statements:   Reviewed by clinician on day of visit: allergies, medications, problem list, medical history, surgical history, family history, social history, and previous encounter notes.   Wilhemena Durie, am acting as Location manager for [employer], FNP-C.  I have reviewed the above documentation for accuracy and completeness, and I agree with the above. - Georgianne Fick, FNP

## 2019-10-20 LAB — COMPREHENSIVE METABOLIC PANEL
ALT: 32 IU/L (ref 0–32)
AST: 51 IU/L — ABNORMAL HIGH (ref 0–40)
Albumin/Globulin Ratio: 2.5 — ABNORMAL HIGH (ref 1.2–2.2)
Albumin: 4 g/dL (ref 3.7–4.7)
Alkaline Phosphatase: 106 IU/L (ref 44–121)
BUN/Creatinine Ratio: 19 (ref 12–28)
BUN: 14 mg/dL (ref 8–27)
Bilirubin Total: 0.5 mg/dL (ref 0.0–1.2)
CO2: 25 mmol/L (ref 20–29)
Calcium: 8.7 mg/dL (ref 8.7–10.3)
Chloride: 100 mmol/L (ref 96–106)
Creatinine, Ser: 0.72 mg/dL (ref 0.57–1.00)
GFR calc Af Amer: 97 mL/min/{1.73_m2} (ref >59)
GFR calc non Af Amer: 84 mL/min/{1.73_m2} (ref >59)
Globulin, Total: 1.6 g/dL (ref 1.5–4.5)
Glucose: 76 mg/dL (ref 65–99)
Potassium: 4.7 mmol/L (ref 3.5–5.2)
Sodium: 140 mmol/L (ref 134–144)
Total Protein: 5.6 g/dL — ABNORMAL LOW (ref 6.0–8.5)

## 2019-10-20 LAB — HEMOGLOBIN A1C
Est. average glucose Bld gHb Est-mCnc: 100 mg/dL
Hgb A1c MFr Bld: 5.1 % (ref 4.8–5.6)

## 2019-10-20 LAB — VITAMIN B12: Vitamin B-12: 298 pg/mL (ref 232–1245)

## 2019-10-20 LAB — CBC WITH DIFFERENTIAL/PLATELET
Basophils Absolute: 0 10*3/uL (ref 0.0–0.2)
Basos: 1 %
EOS (ABSOLUTE): 0.1 10*3/uL (ref 0.0–0.4)
Eos: 3 %
Hematocrit: 37 % (ref 34.0–46.6)
Hemoglobin: 11.8 g/dL (ref 11.1–15.9)
Immature Grans (Abs): 0 10*3/uL (ref 0.0–0.1)
Immature Granulocytes: 0 %
Lymphocytes Absolute: 2 10*3/uL (ref 0.7–3.1)
Lymphs: 43 %
MCH: 31.5 pg (ref 26.6–33.0)
MCHC: 31.9 g/dL (ref 31.5–35.7)
MCV: 99 fL — ABNORMAL HIGH (ref 79–97)
Monocytes Absolute: 0.5 10*3/uL (ref 0.1–0.9)
Monocytes: 12 %
Neutrophils Absolute: 1.9 10*3/uL (ref 1.4–7.0)
Neutrophils: 41 %
Platelets: 150 10*3/uL (ref 150–450)
RBC: 3.75 x10E6/uL — ABNORMAL LOW (ref 3.77–5.28)
RDW: 12.5 % (ref 11.7–15.4)
WBC: 4.6 10*3/uL (ref 3.4–10.8)

## 2019-10-20 LAB — FERRITIN: Ferritin: 33 ng/mL (ref 15–150)

## 2019-10-20 LAB — LIPID PANEL WITH LDL/HDL RATIO
Cholesterol, Total: 99 mg/dL — ABNORMAL LOW (ref 100–199)
HDL: 44 mg/dL (ref >39)
LDL Chol Calc (NIH): 41 mg/dL (ref 0–99)
LDL/HDL Ratio: 0.9 ratio (ref 0.0–3.2)
Triglycerides: 59 mg/dL (ref 0–149)
VLDL Cholesterol Cal: 14 mg/dL (ref 5–40)

## 2019-10-20 LAB — INSULIN, RANDOM: INSULIN: 9.5 u[IU]/mL (ref 2.6–24.9)

## 2019-10-20 LAB — IRON AND TIBC
Iron Saturation: 28 % (ref 15–55)
Iron: 83 ug/dL (ref 27–139)
Total Iron Binding Capacity: 295 ug/dL (ref 250–450)
UIBC: 212 ug/dL (ref 118–369)

## 2019-10-20 LAB — VITAMIN D 25 HYDROXY (VIT D DEFICIENCY, FRACTURES): Vit D, 25-Hydroxy: 43.5 ng/mL (ref 30.0–100.0)

## 2019-10-20 LAB — FOLATE: Folate: 10.4 ng/mL (ref >3.0)

## 2019-10-20 LAB — VITAMIN B1: Thiamine: 116.9 nmol/L (ref 66.5–200.0)

## 2019-10-21 DIAGNOSIS — Z23 Encounter for immunization: Secondary | ICD-10-CM | POA: Diagnosis not present

## 2019-11-16 DIAGNOSIS — Z719 Counseling, unspecified: Secondary | ICD-10-CM | POA: Diagnosis not present

## 2019-11-16 DIAGNOSIS — Z7189 Other specified counseling: Secondary | ICD-10-CM | POA: Diagnosis not present

## 2019-11-16 DIAGNOSIS — Z1231 Encounter for screening mammogram for malignant neoplasm of breast: Secondary | ICD-10-CM | POA: Diagnosis not present

## 2019-11-16 DIAGNOSIS — E785 Hyperlipidemia, unspecified: Secondary | ICD-10-CM | POA: Diagnosis not present

## 2019-11-16 DIAGNOSIS — Z0000 Encounter for general adult medical examination without abnormal findings: Secondary | ICD-10-CM | POA: Diagnosis not present

## 2019-11-16 DIAGNOSIS — Z1389 Encounter for screening for other disorder: Secondary | ICD-10-CM | POA: Diagnosis not present

## 2019-11-16 DIAGNOSIS — Z85828 Personal history of other malignant neoplasm of skin: Secondary | ICD-10-CM | POA: Diagnosis not present

## 2019-11-16 DIAGNOSIS — G4700 Insomnia, unspecified: Secondary | ICD-10-CM | POA: Diagnosis not present

## 2019-11-16 DIAGNOSIS — Z6829 Body mass index (BMI) 29.0-29.9, adult: Secondary | ICD-10-CM | POA: Diagnosis not present

## 2019-11-16 DIAGNOSIS — N3281 Overactive bladder: Secondary | ICD-10-CM | POA: Diagnosis not present

## 2019-11-16 DIAGNOSIS — Z13820 Encounter for screening for osteoporosis: Secondary | ICD-10-CM | POA: Diagnosis not present

## 2019-11-16 DIAGNOSIS — Z136 Encounter for screening for cardiovascular disorders: Secondary | ICD-10-CM | POA: Diagnosis not present

## 2019-12-02 DIAGNOSIS — I959 Hypotension, unspecified: Secondary | ICD-10-CM | POA: Diagnosis not present

## 2019-12-02 DIAGNOSIS — R42 Dizziness and giddiness: Secondary | ICD-10-CM | POA: Diagnosis not present

## 2019-12-02 DIAGNOSIS — F0781 Postconcussional syndrome: Secondary | ICD-10-CM | POA: Diagnosis not present

## 2019-12-02 DIAGNOSIS — Z6828 Body mass index (BMI) 28.0-28.9, adult: Secondary | ICD-10-CM | POA: Diagnosis not present

## 2020-01-11 ENCOUNTER — Other Ambulatory Visit: Payer: Medicare Other

## 2020-01-13 ENCOUNTER — Other Ambulatory Visit: Payer: Self-pay

## 2020-01-13 ENCOUNTER — Ambulatory Visit (INDEPENDENT_AMBULATORY_CARE_PROVIDER_SITE_OTHER): Payer: Medicare Other | Admitting: Adult Health

## 2020-01-13 ENCOUNTER — Encounter (INDEPENDENT_AMBULATORY_CARE_PROVIDER_SITE_OTHER): Payer: Self-pay | Admitting: Adult Health

## 2020-01-13 VITALS — BP 122/65 | HR 58 | Temp 98.5°F | Ht 59.0 in | Wt 136.0 lb

## 2020-01-13 DIAGNOSIS — F3289 Other specified depressive episodes: Secondary | ICD-10-CM | POA: Diagnosis not present

## 2020-01-13 DIAGNOSIS — E119 Type 2 diabetes mellitus without complications: Secondary | ICD-10-CM

## 2020-01-13 DIAGNOSIS — Z6830 Body mass index (BMI) 30.0-30.9, adult: Secondary | ICD-10-CM

## 2020-01-13 DIAGNOSIS — E669 Obesity, unspecified: Secondary | ICD-10-CM | POA: Diagnosis not present

## 2020-01-13 MED ORDER — BUPROPION HCL ER (SR) 200 MG PO TB12: 200.0000 mg | ORAL_TABLET | Freq: Every day | ORAL | 0 refills | Status: DC

## 2020-01-13 MED ORDER — METFORMIN HCL 500 MG PO TABS: 500.0000 mg | ORAL_TABLET | Freq: Every day | ORAL | 0 refills | Status: DC

## 2020-01-13 MED ORDER — OZEMPIC (0.25 OR 0.5 MG/DOSE) 2 MG/1.5ML ~~LOC~~ SOPN: 0.2500 mg | PEN_INJECTOR | SUBCUTANEOUS | 0 refills | Status: DC

## 2020-01-13 NOTE — Progress Notes (Signed)
Chief Complaint:   OBESITY [name] is here to discuss her progress with her obesity treatment plan along with follow-up of her obesity related diagnoses. [name] is practicing portion control and making smarter food choices, such as increasing vegetables and decreasing simple carbohydrates and states she is following her eating plan approximately 90% of the time. [name] states she is keeping her grandchild 5 times per week.  Today's visit was #: 30 Starting weight: 186 lbs Starting date: 01/28/2018 Today's weight: 136 lbs Today's date: 01/13/2020 Total lbs lost to date: 50 Total lbs lost since last in-office visit: 0  Interim History: [name] is in maintenance phase and maintaining her weight around 135 lbs with a current weight of 136. She states "I eat what I want, but keep portions small." She will meet with a general surgeon on 01/18/2020 to discuss surgical repair of an abdominal hernia.  Subjective:   Type 2 diabetes mellitus in remission ([facility]). A1c on 10/15/2019 was 5.1 with normal blood glucose with a slightly elevated insulin level. [name] is on metformin 500 mg, which she is tolerating well. She is on Ozempic 0.25 mg once a week and denies mass in neck, dysphagia, dyspepsia, or persistent hoarseness.  Lab Results  Component Value Date   HGBA1C 5.1 10/15/2019   HGBA1C 4.8 04/08/2019   HGBA1C 5.2 09/25/2018   Lab Results  Component Value Date   MICROALBUR <0.7 07/03/2017   LDLCALC 41 10/15/2019   CREATININE 0.72 10/15/2019   Lab Results  Component Value Date   INSULIN 9.5 10/15/2019   INSULIN 3.4 04/08/2019   INSULIN 3.5 09/25/2018   INSULIN 4.3 01/29/2018   Other depression, with emotional eating. [name] is struggling with emotional eating and using food for comfort to the extent that it is negatively impacting her health. She has been working on behavior modification techniques to help reduce her emotional eating and has been somewhat successful. She shows no sign  of suicidal or homicidal ideations. Blood pressure and heart rate are excellent on today's office visit. [name] reports excellent control of cravings on bupropion SR 200 mg daily. She denies history of seizures.  Assessment/Plan:   Type 2 diabetes mellitus in remission (HCC). Good blood sugar control is important to decrease the likelihood of diabetic complications such as nephropathy, neuropathy, limb loss, blindness, coronary artery disease, and death. Intensive lifestyle modification including diet, exercise and weight loss are the first line of treatment for diabetes. Refill was given for metFORMIN (GLUCOPHAGE) 500 MG tablet daily #90 with 0 refills.  Other depression, with emotional eating. Behavior modification techniques were discussed today to help [name].  Orders and follow up as documented in patient record. Refill was given for buPROPion (WELLBUTRIN SR) 200 MG 12 hr tablet daily #90 with 0 refills.  Class 1 obesity with serious comorbidity and body mass index (BMI) of 30.0 to 30.9 in adult, unspecified obesity type - BMI greater than 30 at start of program. Refill was given for Semaglutide,0.25 or 0.5MG /DOS, (OZEMPIC, 0.25 OR 0.5 MG/DOSE,) 2 MG/1.5ML SOPN 0.25 mg weekly, dispense 4.5 mL with 0 refills. [name] is in maintenance phase and requires 90-day prescription supply.  [name] is currently in the action stage of change. As such, her goal is to continue with weight loss efforts. She has agreed to practicing portion control and making smarter food choices, such as increasing vegetables and decreasing simple carbohydrates.   Exercise goals: Nasheema will continue keeping her grandchild 5  times per week.  Behavioral modification strategies: increasing lean protein intake, meal planning and cooking strategies and planning for success.  [name] has agreed to follow-up with our clinic in 12 weeks. She was informed of the importance of frequent follow-up  visits to maximize her success with intensive lifestyle modifications for her multiple health conditions.   Objective:   Blood pressure 122/65, pulse (!) 58, temperature 98.5 F (36.9 C), height 4\' 11"  (1.499 m), weight 136 lb (61.7 kg), SpO2 100 %. Body mass index is 27.47 kg/m.  General: Cooperative, alert, well developed, in no acute distress. HEENT: Conjunctivae and lids unremarkable. Cardiovascular: Regular rhythm.  Lungs: Normal work of breathing. Neurologic: No focal deficits.   Lab Results  Component Value Date   CREATININE 0.72 10/15/2019   BUN 14 10/15/2019   NA 140 10/15/2019   K 4.7 10/15/2019   CL 100 10/15/2019   CO2 25 10/15/2019   Lab Results  Component Value Date   ALT 32 10/15/2019   AST 51 (H) 10/15/2019   ALKPHOS 106 10/15/2019   BILITOT 0.5 10/15/2019   Lab Results  Component Value Date   HGBA1C 5.1 10/15/2019   HGBA1C 4.8 04/08/2019   HGBA1C 5.2 09/25/2018   HGBA1C 5.7 (H) 01/29/2018   HGBA1C 5.9 07/03/2017   Lab Results  Component Value Date   INSULIN 9.5 10/15/2019   INSULIN 3.4 04/08/2019   INSULIN 3.5 09/25/2018   INSULIN 4.3 01/29/2018   Lab Results  Component Value Date   TSH 2.160 09/25/2018   Lab Results  Component Value Date   CHOL 99 (L) 10/15/2019   HDL 44 10/15/2019   LDLCALC 41 10/15/2019   TRIG 59 10/15/2019   CHOLHDL 2 07/03/2017   Lab Results  Component Value Date   WBC 4.6 10/15/2019   HGB 11.8 10/15/2019   HCT 37.0 10/15/2019   MCV 99 (H) 10/15/2019   PLT 150 10/15/2019   Lab Results  Component Value Date   IRON 83 10/15/2019   TIBC 295 10/15/2019   FERRITIN 33 10/15/2019   Obesity Behavioral Intervention:   Approximately 15 minutes were spent on the discussion below.  ASK: We discussed the diagnosis of obesity with [name] today and [name] agreed to give [place] permission to discuss obesity behavioral modification therapy today.  ASSESS: [name] has the diagnosis of obesity and her BMI today is 27.5. [name] is  in the action stage of change.   ADVISE: [name] was educated on the multiple health risks of obesity as well as the benefit of weight loss to improve her health. She was advised of the need for long term treatment and the importance of lifestyle modifications to improve her current health and to decrease her risk of future health problems.  AGREE: Multiple dietary modification options and treatment options were discussed and [name] agreed to follow the recommendations documented in the above note.  ARRANGE: [name] was educated on the importance of frequent visits to treat obesity as outlined per CMS and USPSTF guidelines and agreed to schedule her next follow up appointment today.  Attestation Statements:   Reviewed by clinician on day of visit: allergies, medications, problem list, medical history, surgical history, family history, social history, and previous encounter notes.  I, Michaelene Song, am acting as Location manager for [employer], NP-C   I have reviewed the above documentation for accuracy and completeness, and I agree with the above. -   Samari Gorby d. Ketsia Linebaugh, NP-C

## 2020-01-14 ENCOUNTER — Ambulatory Visit (INDEPENDENT_AMBULATORY_CARE_PROVIDER_SITE_OTHER): Payer: Medicare Other | Admitting: Family Medicine

## 2020-01-18 ENCOUNTER — Ambulatory Visit: Payer: Medicare Other | Admitting: Cardiology

## 2020-01-18 DIAGNOSIS — K432 Incisional hernia without obstruction or gangrene: Secondary | ICD-10-CM | POA: Diagnosis not present

## 2020-01-18 DIAGNOSIS — Z7189 Other specified counseling: Secondary | ICD-10-CM | POA: Diagnosis not present

## 2020-01-18 DIAGNOSIS — T8579XA Infection and inflammatory reaction due to other internal prosthetic devices, implants and grafts, initial encounter: Secondary | ICD-10-CM | POA: Diagnosis not present

## 2020-01-18 DIAGNOSIS — Z0189 Encounter for other specified special examinations: Secondary | ICD-10-CM | POA: Diagnosis not present

## 2020-01-29 DIAGNOSIS — K469 Unspecified abdominal hernia without obstruction or gangrene: Secondary | ICD-10-CM | POA: Diagnosis not present

## 2020-01-29 DIAGNOSIS — Z6828 Body mass index (BMI) 28.0-28.9, adult: Secondary | ICD-10-CM | POA: Diagnosis not present

## 2020-01-29 DIAGNOSIS — R55 Syncope and collapse: Secondary | ICD-10-CM | POA: Diagnosis not present

## 2020-01-29 DIAGNOSIS — M810 Age-related osteoporosis without current pathological fracture: Secondary | ICD-10-CM | POA: Diagnosis not present

## 2020-01-29 DIAGNOSIS — F419 Anxiety disorder, unspecified: Secondary | ICD-10-CM | POA: Diagnosis not present

## 2020-01-29 DIAGNOSIS — N959 Unspecified menopausal and perimenopausal disorder: Secondary | ICD-10-CM | POA: Diagnosis not present

## 2020-01-29 DIAGNOSIS — I6529 Occlusion and stenosis of unspecified carotid artery: Secondary | ICD-10-CM | POA: Diagnosis not present

## 2020-01-29 DIAGNOSIS — R42 Dizziness and giddiness: Secondary | ICD-10-CM | POA: Diagnosis not present

## 2020-02-09 DIAGNOSIS — Z6827 Body mass index (BMI) 27.0-27.9, adult: Secondary | ICD-10-CM | POA: Diagnosis not present

## 2020-02-09 DIAGNOSIS — M810 Age-related osteoporosis without current pathological fracture: Secondary | ICD-10-CM | POA: Diagnosis not present

## 2020-02-17 DIAGNOSIS — H8123 Vestibular neuronitis, bilateral: Secondary | ICD-10-CM | POA: Diagnosis not present

## 2020-02-18 DIAGNOSIS — E782 Mixed hyperlipidemia: Secondary | ICD-10-CM | POA: Diagnosis not present

## 2020-02-18 DIAGNOSIS — R011 Cardiac murmur, unspecified: Secondary | ICD-10-CM | POA: Diagnosis not present

## 2020-02-19 DIAGNOSIS — Z1231 Encounter for screening mammogram for malignant neoplasm of breast: Secondary | ICD-10-CM | POA: Diagnosis not present

## 2020-02-26 ENCOUNTER — Other Ambulatory Visit: Payer: Self-pay | Admitting: Family Medicine

## 2020-02-26 DIAGNOSIS — R251 Tremor, unspecified: Secondary | ICD-10-CM

## 2020-03-02 DIAGNOSIS — M810 Age-related osteoporosis without current pathological fracture: Secondary | ICD-10-CM | POA: Diagnosis not present

## 2020-03-21 DIAGNOSIS — T8579XA Infection and inflammatory reaction due to other internal prosthetic devices, implants and grafts, initial encounter: Secondary | ICD-10-CM | POA: Diagnosis not present

## 2020-03-21 DIAGNOSIS — K432 Incisional hernia without obstruction or gangrene: Secondary | ICD-10-CM | POA: Diagnosis not present

## 2020-03-23 DIAGNOSIS — Z719 Counseling, unspecified: Secondary | ICD-10-CM | POA: Diagnosis not present

## 2020-03-23 DIAGNOSIS — R309 Painful micturition, unspecified: Secondary | ICD-10-CM | POA: Diagnosis not present

## 2020-03-23 DIAGNOSIS — B373 Candidiasis of vulva and vagina: Secondary | ICD-10-CM | POA: Diagnosis not present

## 2020-03-23 DIAGNOSIS — E119 Type 2 diabetes mellitus without complications: Secondary | ICD-10-CM | POA: Diagnosis not present

## 2020-03-23 DIAGNOSIS — Z6828 Body mass index (BMI) 28.0-28.9, adult: Secondary | ICD-10-CM | POA: Diagnosis not present

## 2020-03-25 DIAGNOSIS — E785 Hyperlipidemia, unspecified: Secondary | ICD-10-CM | POA: Diagnosis not present

## 2020-03-25 DIAGNOSIS — Z01818 Encounter for other preprocedural examination: Secondary | ICD-10-CM | POA: Diagnosis not present

## 2020-03-25 DIAGNOSIS — Z6829 Body mass index (BMI) 29.0-29.9, adult: Secondary | ICD-10-CM | POA: Diagnosis not present

## 2020-03-25 DIAGNOSIS — E119 Type 2 diabetes mellitus without complications: Secondary | ICD-10-CM | POA: Diagnosis not present

## 2020-03-27 ENCOUNTER — Other Ambulatory Visit (INDEPENDENT_AMBULATORY_CARE_PROVIDER_SITE_OTHER): Payer: Self-pay | Admitting: Family Medicine

## 2020-03-27 DIAGNOSIS — F3289 Other specified depressive episodes: Secondary | ICD-10-CM

## 2020-03-28 NOTE — Telephone Encounter (Signed)
Last OV with Katy 

## 2020-04-06 ENCOUNTER — Ambulatory Visit (INDEPENDENT_AMBULATORY_CARE_PROVIDER_SITE_OTHER): Payer: Medicare Other | Admitting: Family Medicine

## 2020-04-06 ENCOUNTER — Other Ambulatory Visit: Payer: Self-pay

## 2020-04-06 ENCOUNTER — Encounter (INDEPENDENT_AMBULATORY_CARE_PROVIDER_SITE_OTHER): Payer: Self-pay | Admitting: Family Medicine

## 2020-04-06 VITALS — BP 129/69 | HR 52 | Temp 98.1°F | Ht 59.0 in | Wt 135.0 lb

## 2020-04-06 DIAGNOSIS — E669 Obesity, unspecified: Secondary | ICD-10-CM | POA: Diagnosis not present

## 2020-04-06 DIAGNOSIS — E559 Vitamin D deficiency, unspecified: Secondary | ICD-10-CM

## 2020-04-06 DIAGNOSIS — Z6830 Body mass index (BMI) 30.0-30.9, adult: Secondary | ICD-10-CM | POA: Diagnosis not present

## 2020-04-06 DIAGNOSIS — E119 Type 2 diabetes mellitus without complications: Secondary | ICD-10-CM | POA: Diagnosis not present

## 2020-04-06 MED ORDER — OZEMPIC (0.25 OR 0.5 MG/DOSE) 2 MG/1.5ML ~~LOC~~ SOPN: 0.5000 mg | PEN_INJECTOR | SUBCUTANEOUS | 0 refills | Status: DC

## 2020-04-07 NOTE — Progress Notes (Addendum)
Chief Complaint:   OBESITY [name] is here to discuss her progress with her obesity treatment plan along with follow-up of her obesity related diagnoses. [name] is on practicing portion control and making smarter food choices, such as increasing vegetables and decreasing simple carbohydrates and states she is following her eating plan approximately 100% of the time. 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  Today's visit was #: 34 Starting weight: 186 lbs Starting date: 01/28/2018 Today's weight: 135 lbs Today's date: 04/06/2020 Total lbs lost to date: 51 lbs Total lbs lost since last in-office visit: 1 lb  Interim History: [name] is doing well with maintaining her weight between 135-140. She is 135 lbs today. She is having protein at all meals. She keeps her 25 month old grandson 4 days a week which keeps her very active. She occasionally rides the bike at the gym. She is planning for abdominal hernia revision surgery at the end of April. She will be in the hospital for 6 days in [place].   Subjective:   1. Type 2 diabetes mellitus in remission ([place]) [name] notes hunger has increased with Ozempic 0.25 mg weekly. Her last A1c 5.1 on 02/09/2020.  Lab Results  Component Value Date   HGBA1C 5.1 10/15/2019   HGBA1C 4.8 04/08/2019   HGBA1C 5.2 09/25/2018   Lab Results  Component Value Date   MICROALBUR <0.7 07/03/2017   LDLCALC 41 10/15/2019   CREATININE 0.72 10/15/2019   Lab Results  Component Value Date   INSULIN 9.5 10/15/2019   INSULIN 3.4 04/08/2019   INSULIN 3.5 09/25/2018   INSULIN 4.3 01/29/2018    2. Vitamin D deficiency [name]'s Vitamin D level was 43.5 on 10/15/2019. Maintained on prescription vitamin D 50,000 IU every 3 days.   Ref. Range 10/15/2019 11:04  Vitamin D, 25-Hydroxy Latest Ref Range: 30.0 - 100.0 ng/mL 43.5   Assessment/Plan:   1. Type 2 diabetes mellitus in remission (HCC)  Increase Ozempic dose, as per below.  - Semaglutide,0.25 or 0.5MG /DOS, (OZEMPIC, 0.25 OR  0.5 MG/DOSE,) 2 MG/1.5ML SOPN; Inject 0.5 mg into the skin once a week.  Dispense: 4.5 mL; Refill: 0  2. Vitamin D deficiency . She agrees to continue to take prescription Vitamin D @50 ,000 IU every 3 days and will follow-up for routine testing of Vitamin D, at least 2-3 times per year to avoid over-replacement.  3. Class 1 obesity with serious comorbidity and body mass index (BMI) of 30.0 to 30.9 in adult, unspecified obesity type [name] is currently in the action stage of change. As such, her goal is to maintain weight for now. She has agreed to practicing portion control and making smarter food choices, such as increasing vegetables and decreasing simple carbohydrates.   Exercise goals: As is- keep up activity with caring for grandson  Behavioral modification strategies: increasing lean protein intake, increasing vegetables and planning for success.  [name] has agreed to follow-up with our clinic in 3 months. She was informed of the importance of frequent follow-up visits to maximize her success with intensive lifestyle modifications for her multiple health conditions.   Objective:   Blood pressure 129/69, pulse (!) 52, temperature 98.1 F (36.7 C), height 4\' 11"  (1.499 m), weight 135 lb (61.2 kg), SpO2 97 %. Body mass index is 27.27 kg/m.  General: Cooperative, alert, well developed, in no acute distress. HEENT: Conjunctivae and lids unremarkable. Cardiovascular: Regular rhythm.  Lungs: Normal work of breathing. Neurologic: No focal deficits.   Lab Results  Component Value Date  CREATININE 0.72 10/15/2019   BUN 14 10/15/2019   NA 140 10/15/2019   K 4.7 10/15/2019   CL 100 10/15/2019   CO2 25 10/15/2019   Lab Results  Component Value Date   ALT 32 10/15/2019   AST 51 (H) 10/15/2019   ALKPHOS 106 10/15/2019   BILITOT 0.5 10/15/2019   Lab Results  Component Value Date   HGBA1C 5.1 10/15/2019   HGBA1C 4.8 04/08/2019   HGBA1C 5.2 09/25/2018   HGBA1C 5.7 (H) 01/29/2018    HGBA1C 5.9 07/03/2017   Lab Results  Component Value Date   INSULIN 9.5 10/15/2019   INSULIN 3.4 04/08/2019   INSULIN 3.5 09/25/2018   INSULIN 4.3 01/29/2018   Lab Results  Component Value Date   TSH 2.160 09/25/2018   Lab Results  Component Value Date   CHOL 99 (L) 10/15/2019   HDL 44 10/15/2019   LDLCALC 41 10/15/2019   TRIG 59 10/15/2019   CHOLHDL 2 07/03/2017   Lab Results  Component Value Date   WBC 4.6 10/15/2019   HGB 11.8 10/15/2019   HCT 37.0 10/15/2019   MCV 99 (H) 10/15/2019   PLT 150 10/15/2019   Lab Results  Component Value Date   IRON 83 10/15/2019   TIBC 295 10/15/2019   FERRITIN 33 10/15/2019    Obesity Behavioral Intervention:   Approximately 15 minutes were spent on the discussion below.  ASK: We discussed the diagnosis of obesity with [name] today and [name] agreed to give [place] permission to discuss obesity behavioral modification therapy today.  ASSESS: [name] has the diagnosis of obesity and her BMI today is 18.9. [name] is in the action stage of change.   ADVISE: [name] was educated on the multiple health risks of obesity as well as the benefit of weight loss to improve her health. She was advised of the need for long term treatment and the importance of lifestyle modifications to improve her current health and to decrease her risk of future health problems.  AGREE: Multiple dietary modification options and treatment options were discussed and [name] agreed to follow the recommendations documented in the above note.  ARRANGE: [name] was educated on the importance of frequent visits to treat obesity as outlined per CMS and USPSTF guidelines and agreed to schedule her next follow up appointment today.  Attestation Statements:   Reviewed by clinician on day of visit: allergies, medications, problem list, medical history, surgical history, family history, social history, and previous encounter notes.  Coral Ceo, am acting as Location manager for  [employer], [place].  I have reviewed the above documentation for accuracy and completeness, and I agree with the above. -  Georgianne Fick, FNP

## 2020-04-10 ENCOUNTER — Encounter (INDEPENDENT_AMBULATORY_CARE_PROVIDER_SITE_OTHER): Payer: Self-pay | Admitting: Family Medicine

## 2020-05-03 DIAGNOSIS — F419 Anxiety disorder, unspecified: Secondary | ICD-10-CM | POA: Diagnosis not present

## 2020-05-03 DIAGNOSIS — I959 Hypotension, unspecified: Secondary | ICD-10-CM | POA: Diagnosis not present

## 2020-05-03 DIAGNOSIS — M25562 Pain in left knee: Secondary | ICD-10-CM | POA: Diagnosis not present

## 2020-05-03 DIAGNOSIS — Z6828 Body mass index (BMI) 28.0-28.9, adult: Secondary | ICD-10-CM | POA: Diagnosis not present

## 2020-05-03 DIAGNOSIS — J302 Other seasonal allergic rhinitis: Secondary | ICD-10-CM | POA: Diagnosis not present

## 2020-05-17 DIAGNOSIS — Z9889 Other specified postprocedural states: Secondary | ICD-10-CM | POA: Diagnosis not present

## 2020-05-17 DIAGNOSIS — R339 Retention of urine, unspecified: Secondary | ICD-10-CM | POA: Diagnosis not present

## 2020-05-17 DIAGNOSIS — K432 Incisional hernia without obstruction or gangrene: Secondary | ICD-10-CM | POA: Diagnosis not present

## 2020-05-17 DIAGNOSIS — Z9049 Acquired absence of other specified parts of digestive tract: Secondary | ICD-10-CM | POA: Diagnosis not present

## 2020-05-17 DIAGNOSIS — K660 Peritoneal adhesions (postprocedural) (postinfection): Secondary | ICD-10-CM | POA: Diagnosis not present

## 2020-05-17 DIAGNOSIS — K436 Other and unspecified ventral hernia with obstruction, without gangrene: Secondary | ICD-10-CM | POA: Diagnosis not present

## 2020-05-17 DIAGNOSIS — K430 Incisional hernia with obstruction, without gangrene: Secondary | ICD-10-CM | POA: Diagnosis not present

## 2020-05-17 DIAGNOSIS — T8579XA Infection and inflammatory reaction due to other internal prosthetic devices, implants and grafts, initial encounter: Secondary | ICD-10-CM | POA: Diagnosis not present

## 2020-05-22 ENCOUNTER — Other Ambulatory Visit (INDEPENDENT_AMBULATORY_CARE_PROVIDER_SITE_OTHER): Payer: Self-pay | Admitting: Family Medicine

## 2020-05-22 DIAGNOSIS — E119 Type 2 diabetes mellitus without complications: Secondary | ICD-10-CM

## 2020-05-23 ENCOUNTER — Encounter (INDEPENDENT_AMBULATORY_CARE_PROVIDER_SITE_OTHER): Payer: Self-pay

## 2020-05-23 DIAGNOSIS — Z0000 Encounter for general adult medical examination without abnormal findings: Secondary | ICD-10-CM | POA: Diagnosis not present

## 2020-05-23 DIAGNOSIS — Z9889 Other specified postprocedural states: Secondary | ICD-10-CM | POA: Diagnosis not present

## 2020-05-23 DIAGNOSIS — Z8719 Personal history of other diseases of the digestive system: Secondary | ICD-10-CM | POA: Diagnosis not present

## 2020-05-23 NOTE — Telephone Encounter (Signed)
Message sent to patient

## 2020-05-23 NOTE — Telephone Encounter (Signed)
Pt last seen by Dawn Whitmire, FNP.  

## 2020-05-24 NOTE — Telephone Encounter (Signed)
Refill request

## 2020-05-30 ENCOUNTER — Other Ambulatory Visit: Payer: Self-pay | Admitting: Family Medicine

## 2020-05-30 DIAGNOSIS — Z0000 Encounter for general adult medical examination without abnormal findings: Secondary | ICD-10-CM | POA: Diagnosis not present

## 2020-05-30 DIAGNOSIS — R251 Tremor, unspecified: Secondary | ICD-10-CM

## 2020-05-30 DIAGNOSIS — Z9889 Other specified postprocedural states: Secondary | ICD-10-CM | POA: Diagnosis not present

## 2020-05-30 DIAGNOSIS — Z8719 Personal history of other diseases of the digestive system: Secondary | ICD-10-CM | POA: Diagnosis not present

## 2020-06-04 ENCOUNTER — Other Ambulatory Visit (INDEPENDENT_AMBULATORY_CARE_PROVIDER_SITE_OTHER): Payer: Self-pay | Admitting: Adult Health

## 2020-06-04 DIAGNOSIS — E119 Type 2 diabetes mellitus without complications: Secondary | ICD-10-CM

## 2020-06-06 NOTE — Telephone Encounter (Signed)
Refill request

## 2020-06-06 NOTE — Telephone Encounter (Signed)
Pt last seen by Dawn Whitmire, FNP.  

## 2020-06-14 DIAGNOSIS — H353131 Nonexudative age-related macular degeneration, bilateral, early dry stage: Secondary | ICD-10-CM | POA: Diagnosis not present

## 2020-06-22 DIAGNOSIS — Z0000 Encounter for general adult medical examination without abnormal findings: Secondary | ICD-10-CM | POA: Diagnosis not present

## 2020-06-22 DIAGNOSIS — Z9889 Other specified postprocedural states: Secondary | ICD-10-CM | POA: Diagnosis not present

## 2020-06-22 DIAGNOSIS — Z8719 Personal history of other diseases of the digestive system: Secondary | ICD-10-CM | POA: Diagnosis not present

## 2020-06-22 DIAGNOSIS — Z23 Encounter for immunization: Secondary | ICD-10-CM | POA: Diagnosis not present

## 2020-07-01 DIAGNOSIS — N3946 Mixed incontinence: Secondary | ICD-10-CM | POA: Diagnosis not present

## 2020-07-01 DIAGNOSIS — R32 Unspecified urinary incontinence: Secondary | ICD-10-CM | POA: Diagnosis not present

## 2020-07-01 DIAGNOSIS — R351 Nocturia: Secondary | ICD-10-CM | POA: Diagnosis not present

## 2020-07-01 DIAGNOSIS — R3915 Urgency of urination: Secondary | ICD-10-CM | POA: Diagnosis not present

## 2020-07-01 DIAGNOSIS — R350 Frequency of micturition: Secondary | ICD-10-CM | POA: Diagnosis not present

## 2020-07-11 ENCOUNTER — Encounter (INDEPENDENT_AMBULATORY_CARE_PROVIDER_SITE_OTHER): Payer: Self-pay | Admitting: Family Medicine

## 2020-07-11 ENCOUNTER — Ambulatory Visit (INDEPENDENT_AMBULATORY_CARE_PROVIDER_SITE_OTHER): Payer: Medicare Other | Admitting: Family Medicine

## 2020-07-11 ENCOUNTER — Other Ambulatory Visit: Payer: Self-pay

## 2020-07-11 VITALS — BP 111/62 | HR 60 | Temp 98.2°F | Ht 59.0 in | Wt 127.0 lb

## 2020-07-11 DIAGNOSIS — F3289 Other specified depressive episodes: Secondary | ICD-10-CM | POA: Diagnosis not present

## 2020-07-11 DIAGNOSIS — E1169 Type 2 diabetes mellitus with other specified complication: Secondary | ICD-10-CM

## 2020-07-11 DIAGNOSIS — Z6837 Body mass index (BMI) 37.0-37.9, adult: Secondary | ICD-10-CM

## 2020-07-11 MED ORDER — OZEMPIC (0.25 OR 0.5 MG/DOSE) 2 MG/1.5ML ~~LOC~~ SOPN: 0.2500 mg | PEN_INJECTOR | SUBCUTANEOUS | 2 refills | Status: DC

## 2020-07-11 MED ORDER — BUPROPION HCL ER (SR) 200 MG PO TB12: 200.0000 mg | ORAL_TABLET | Freq: Every day | ORAL | 0 refills | Status: DC

## 2020-07-11 MED ORDER — METFORMIN HCL 500 MG PO TABS: 1.0000 | ORAL_TABLET | Freq: Every day | ORAL | 0 refills | Status: DC

## 2020-07-12 ENCOUNTER — Encounter (INDEPENDENT_AMBULATORY_CARE_PROVIDER_SITE_OTHER): Payer: Self-pay | Admitting: Family Medicine

## 2020-07-12 NOTE — Progress Notes (Signed)
Chief Complaint:   OBESITY [name] is here to discuss her progress with her obesity treatment plan along with follow-up of her obesity related diagnoses. [name] is on practicing portion control and making smarter food choices, such as increasing vegetables and decreasing simple carbohydrates and states she is following her eating plan approximately 100% of the time. [name] states she is walking 30-60 minutes 4 times per week.  Today's visit was #: 67 Starting weight: 186 lbs Starting date: 01/28/2018 Today's weight: 127 lbs Today's date: 07/11/2020 Total lbs lost to date: 58 Total lbs lost since last in-office visit: 8  Interim History: [name] recently had ventral hernia surgery and feels her weight loss is secondary to this. She has been at her goal weight of 135-140 lbs. She is concentrating on getting protein first. Surgery went well and she is recovering well.  Weight is 127 today (BMI 25). She is going to [place] in August 2022.  Subjective:   1. Type 2 diabetes mellitus in remission (HCC) [name]'s last A1c was 5.1. She is well controlled on Ozempic and Metformin. Ozempic dose is 0.5 mg weekly.  Lab Results  Component Value Date   HGBA1C 5.1 10/15/2019   HGBA1C 4.8 04/08/2019   HGBA1C 5.2 09/25/2018   Lab Results  Component Value Date   MICROALBUR <0.7 07/03/2017   LDLCALC 41 10/15/2019   CREATININE 0.72 10/15/2019   Lab Results  Component Value Date   INSULIN 9.5 10/15/2019   INSULIN 3.4 04/08/2019   INSULIN 3.5 09/25/2018   INSULIN 4.3 01/29/2018   2. Other depression, with emotional eating [name] denies stress eating at this time. Mood is stable on bupropion.  Assessment/Plan:   1. Type 2 diabetes mellitus in remission (HCC) Continue metformin and Ozempic.  - metFORMIN (GLUCOPHAGE) 500 MG tablet; Take 1 tablet (500 mg total) by mouth daily.  Dispense: 90 tablet; Refill: 0  - Semaglutide,0.25 or 0.5MG /DOS, (OZEMPIC, 0.25 OR 0.5 MG/DOSE,) 2 MG/1.5ML SOPN; Inject 0.25 mg  into the skin once a week.  Dispense: 2 mL; Refill: 2  2. Other depression, with emotional eating Refill:  - buPROPion (WELLBUTRIN SR) 200 MG 12 hr tablet; Take 1 tablet (200 mg total) by mouth daily.  Dispense: 90 tablet; Refill: 0  3. Overweight: Current BMI 25.64  [name] is currently in the action stage of change. As such, her goal is to maintain weight for now. She has agreed to practicing portion control and making smarter food choices, such as increasing vegetables and decreasing simple carbohydrates.   Advised pt that I would like her to work on gaining muscle. We discussed that she should not lose anymore weight for optimal health.  Exercise goals:  Add resistance training 2-3 times a week.  Behavioral modification strategies: increasing lean protein intake.  [name] has agreed to follow-up with our clinic in 12 weeks.  Objective:   Blood pressure 111/62, pulse 60, temperature 98.2 F (36.8 C), height 4\' 11"  (1.499 m), weight 127 lb (57.6 kg), SpO2 97 %. Body mass index is 25.65 kg/m.  General: Cooperative, alert, well developed, in no acute distress. HEENT: Conjunctivae and lids unremarkable. Cardiovascular: Regular rhythm.  Lungs: Normal work of breathing. Neurologic: No focal deficits.   Lab Results  Component Value Date   CREATININE 0.72 10/15/2019   BUN 14 10/15/2019   NA 140 10/15/2019   K 4.7 10/15/2019   CL 100 10/15/2019   CO2 25 10/15/2019   Lab Results  Component Value Date   ALT  32 10/15/2019   AST 51 (H) 10/15/2019   ALKPHOS 106 10/15/2019   BILITOT 0.5 10/15/2019   Lab Results  Component Value Date   HGBA1C 5.1 10/15/2019   HGBA1C 4.8 04/08/2019   HGBA1C 5.2 09/25/2018   HGBA1C 5.7 (H) 01/29/2018   HGBA1C 5.9 07/03/2017   Lab Results  Component Value Date   INSULIN 9.5 10/15/2019   INSULIN 3.4 04/08/2019   INSULIN 3.5 09/25/2018   INSULIN 4.3 01/29/2018   Lab Results  Component Value Date   TSH 2.160 09/25/2018   Lab Results   Component Value Date   CHOL 99 (L) 10/15/2019   HDL 44 10/15/2019   LDLCALC 41 10/15/2019   TRIG 59 10/15/2019   CHOLHDL 2 07/03/2017   Lab Results  Component Value Date   WBC 4.6 10/15/2019   HGB 11.8 10/15/2019   HCT 37.0 10/15/2019   MCV 99 (H) 10/15/2019   PLT 150 10/15/2019   Lab Results  Component Value Date   IRON 83 10/15/2019   TIBC 295 10/15/2019   FERRITIN 33 10/15/2019    Obesity Behavioral Intervention:   Approximately 15 minutes were spent on the discussion below.  ASK: We discussed the diagnosis of obesity with [name] today and [name] agreed to give [place] permission to discuss obesity behavioral modification therapy today.  ASSESS: [name] has the diagnosis of obesity and her BMI today is 25.8. [name] is in the action stage of change.   ADVISE: [name] was educated on the multiple health risks of obesity as well as the benefit of weight loss to improve her health. She was advised of the need for long term treatment and the importance of lifestyle modifications to improve her current health and to decrease her risk of future health problems.  AGREE: Multiple dietary modification options and treatment options were discussed and [name] agreed to follow the recommendations documented in the above note.  ARRANGE: [name] was educated on the importance of frequent visits to treat obesity as outlined per CMS and USPSTF guidelines and agreed to schedule her next follow up appointment today.  Attestation Statements:   Reviewed by clinician on day of visit: allergies, medications, problem list, medical history, surgical history, family history, social history, and previous encounter notes.  Coral Ceo, CMA, am acting as Location manager for [employer], [place].  I have reviewed the above documentation for accuracy and completeness, and I agree with the above. -  Georgianne Fick, FNP

## 2020-07-15 ENCOUNTER — Other Ambulatory Visit (INDEPENDENT_AMBULATORY_CARE_PROVIDER_SITE_OTHER): Payer: Self-pay | Admitting: Adult Health

## 2020-07-15 DIAGNOSIS — F3289 Other specified depressive episodes: Secondary | ICD-10-CM

## 2020-07-18 NOTE — Telephone Encounter (Signed)
Pt last seen by Dawn Whitmire, FNP.  

## 2020-07-20 ENCOUNTER — Other Ambulatory Visit (INDEPENDENT_AMBULATORY_CARE_PROVIDER_SITE_OTHER): Payer: Self-pay | Admitting: Family Medicine

## 2020-07-20 DIAGNOSIS — E559 Vitamin D deficiency, unspecified: Secondary | ICD-10-CM

## 2020-07-20 NOTE — Telephone Encounter (Signed)
Pt last seen by Dawn Whitmire, FNP.  

## 2020-08-08 DIAGNOSIS — E119 Type 2 diabetes mellitus without complications: Secondary | ICD-10-CM | POA: Diagnosis not present

## 2020-08-08 DIAGNOSIS — Z1331 Encounter for screening for depression: Secondary | ICD-10-CM | POA: Diagnosis not present

## 2020-08-08 DIAGNOSIS — Z6827 Body mass index (BMI) 27.0-27.9, adult: Secondary | ICD-10-CM | POA: Diagnosis not present

## 2020-08-08 DIAGNOSIS — G5601 Carpal tunnel syndrome, right upper limb: Secondary | ICD-10-CM | POA: Diagnosis not present

## 2020-08-08 DIAGNOSIS — G4709 Other insomnia: Secondary | ICD-10-CM | POA: Diagnosis not present

## 2020-08-08 DIAGNOSIS — M13849 Other specified arthritis, unspecified hand: Secondary | ICD-10-CM | POA: Diagnosis not present

## 2020-08-08 DIAGNOSIS — E785 Hyperlipidemia, unspecified: Secondary | ICD-10-CM | POA: Diagnosis not present

## 2020-08-26 DIAGNOSIS — R32 Unspecified urinary incontinence: Secondary | ICD-10-CM | POA: Diagnosis not present

## 2020-08-31 DIAGNOSIS — M810 Age-related osteoporosis without current pathological fracture: Secondary | ICD-10-CM | POA: Diagnosis not present

## 2020-09-05 DIAGNOSIS — R350 Frequency of micturition: Secondary | ICD-10-CM | POA: Diagnosis not present

## 2020-09-05 DIAGNOSIS — R3915 Urgency of urination: Secondary | ICD-10-CM | POA: Diagnosis not present

## 2020-09-05 DIAGNOSIS — R351 Nocturia: Secondary | ICD-10-CM | POA: Diagnosis not present

## 2020-09-05 DIAGNOSIS — N3946 Mixed incontinence: Secondary | ICD-10-CM | POA: Diagnosis not present

## 2020-09-12 DIAGNOSIS — Z6827 Body mass index (BMI) 27.0-27.9, adult: Secondary | ICD-10-CM | POA: Diagnosis not present

## 2020-09-12 DIAGNOSIS — F419 Anxiety disorder, unspecified: Secondary | ICD-10-CM | POA: Diagnosis not present

## 2020-09-12 DIAGNOSIS — J302 Other seasonal allergic rhinitis: Secondary | ICD-10-CM | POA: Diagnosis not present

## 2020-09-12 DIAGNOSIS — M1990 Unspecified osteoarthritis, unspecified site: Secondary | ICD-10-CM | POA: Diagnosis not present

## 2020-10-10 ENCOUNTER — Ambulatory Visit (INDEPENDENT_AMBULATORY_CARE_PROVIDER_SITE_OTHER): Payer: Medicare Other | Admitting: Family Medicine

## 2020-10-11 DIAGNOSIS — Z136 Encounter for screening for cardiovascular disorders: Secondary | ICD-10-CM | POA: Diagnosis not present

## 2020-10-11 DIAGNOSIS — E785 Hyperlipidemia, unspecified: Secondary | ICD-10-CM | POA: Diagnosis not present

## 2020-10-11 DIAGNOSIS — Z6827 Body mass index (BMI) 27.0-27.9, adult: Secondary | ICD-10-CM | POA: Diagnosis not present

## 2020-10-11 DIAGNOSIS — R109 Unspecified abdominal pain: Secondary | ICD-10-CM | POA: Diagnosis not present

## 2020-10-11 DIAGNOSIS — U071 COVID-19: Secondary | ICD-10-CM | POA: Diagnosis not present

## 2020-10-13 DIAGNOSIS — Z23 Encounter for immunization: Secondary | ICD-10-CM | POA: Diagnosis not present

## 2020-10-17 ENCOUNTER — Ambulatory Visit (INDEPENDENT_AMBULATORY_CARE_PROVIDER_SITE_OTHER): Payer: Medicare Other | Admitting: Family Medicine

## 2020-10-25 DIAGNOSIS — R197 Diarrhea, unspecified: Secondary | ICD-10-CM | POA: Diagnosis not present

## 2020-10-28 DIAGNOSIS — Z23 Encounter for immunization: Secondary | ICD-10-CM | POA: Diagnosis not present

## 2020-10-28 DIAGNOSIS — U071 COVID-19: Secondary | ICD-10-CM | POA: Diagnosis not present

## 2020-10-31 DIAGNOSIS — R32 Unspecified urinary incontinence: Secondary | ICD-10-CM | POA: Diagnosis not present

## 2020-10-31 DIAGNOSIS — N3946 Mixed incontinence: Secondary | ICD-10-CM | POA: Diagnosis not present

## 2020-10-31 DIAGNOSIS — R3915 Urgency of urination: Secondary | ICD-10-CM | POA: Diagnosis not present

## 2020-10-31 DIAGNOSIS — R350 Frequency of micturition: Secondary | ICD-10-CM | POA: Diagnosis not present

## 2020-11-15 ENCOUNTER — Other Ambulatory Visit: Payer: Self-pay

## 2020-11-15 ENCOUNTER — Ambulatory Visit (INDEPENDENT_AMBULATORY_CARE_PROVIDER_SITE_OTHER): Payer: Medicare Other | Admitting: Family Medicine

## 2020-11-15 ENCOUNTER — Encounter (INDEPENDENT_AMBULATORY_CARE_PROVIDER_SITE_OTHER): Payer: Self-pay | Admitting: Family Medicine

## 2020-11-15 VITALS — BP 118/52 | HR 53 | Temp 97.8°F | Ht 59.0 in | Wt 126.0 lb

## 2020-11-15 DIAGNOSIS — Z6837 Body mass index (BMI) 37.0-37.9, adult: Secondary | ICD-10-CM

## 2020-11-15 DIAGNOSIS — E1169 Type 2 diabetes mellitus with other specified complication: Secondary | ICD-10-CM

## 2020-11-15 DIAGNOSIS — F3289 Other specified depressive episodes: Secondary | ICD-10-CM | POA: Diagnosis not present

## 2020-11-15 DIAGNOSIS — E7849 Other hyperlipidemia: Secondary | ICD-10-CM

## 2020-11-15 MED ORDER — BUPROPION HCL ER (SR) 200 MG PO TB12: 200.0000 mg | ORAL_TABLET | Freq: Every day | ORAL | 0 refills | Status: DC

## 2020-11-15 MED ORDER — ROSUVASTATIN CALCIUM 20 MG PO TABS: 20.0000 mg | ORAL_TABLET | Freq: Every day | ORAL | 0 refills | Status: DC

## 2020-11-15 MED ORDER — OZEMPIC (0.25 OR 0.5 MG/DOSE) 2 MG/1.5ML ~~LOC~~ SOPN: 0.5000 mg | PEN_INJECTOR | SUBCUTANEOUS | 0 refills | Status: DC

## 2020-11-15 NOTE — Progress Notes (Signed)
Chief Complaint:   OBESITY Harveen is here to discuss her progress with her obesity treatment plan along with follow-up of her obesity related diagnoses. Amaiya is on practicing portion control and making smarter food choices, such as increasing vegetables and decreasing simple carbohydrates and states she is following her eating plan approximately 100% of the time. Kelce states she is walking for 60 minutes 4-7 times per week.  Today's visit was #: 50 Starting weight: 186 lbs Starting date: 01/28/2018 Today's weight: 126 lbs Today's date: 11/15/2020 Total lbs lost to date: 60 lbs Total lbs lost since last in-office visit: 1 lb  Interim History: Maryruth notes that she does not eat enough protein.  She is down 1 lbs. We had discussed at last OV that she should work on maintaining her weight and muscle strengthening. However, She had COVID 2 months ago and her taste and appetite is off.  She notes feeling weak overall.  Subjective:   1. Type 2 diabetes mellitus with other specified complication, without long-term current use of insulin (HCC) Last A1c was 5.1.  On Ozempic 0.25 mg  and metformin.  She has had diarrhea since she had COVID 2 months ago.  She is afraid to stop Ozempic because she does not want to regain weight.  2. Other hyperlipidemia LDL at goal (41).  HDL slightly low at 44.  Triglycerides within normal limits.(59).  On Crestor 20 mg daily.  3. Other depression, with emotional eating She notes some depression due to her husband's health issues.  Denies SI/HI. She is on bupropion 200 mg.  Assessment/Plan:   1. Type 2 diabetes mellitus with other specified complication, without long-term current use of insulin (HCC) Discontinue metformin. Decrease dose of Ozempic to 6.160 mg (9 clicks) weekly. I would like to discontinue the Ozempic but she wants to try reducing the dose.  Refill Ozempic 0.25 mg weekly. Check A1c at next office visit.  - Refill Semaglutide,0.25 or 0.5MG /DOS,  (OZEMPIC, 0.25 OR 0.5 MG/DOSE,) 2 MG/1.5ML SOPN; Inject 0.5 mg into the skin once a week.  Dispense: 1.5 mL; Refill: 0  2. Other hyperlipidemia Refill Crestor 20 mg daily.  - Refill rosuvastatin (CRESTOR) 20 MG tablet; Take 1 tablet (20 mg total) by mouth daily.  Dispense: 90 tablet; Refill: 0  3. Other depression, with emotional eating Refill bupropion 200 mg daily, as per below.  - Refill buPROPion (WELLBUTRIN SR) 200 MG 12 hr tablet; Take 1 tablet (200 mg total) by mouth daily.  Dispense: 90 tablet; Refill: 0  4. Overweight: Current BMI 25.44  Asaiah is currently in the action stage of change. As such, her goal is to maintain weight for now. She has agreed to practicing portion control and making smarter food choices, such as increasing vegetables and decreasing simple carbohydrates. 80 grams of protein per day.  Discussed the importance of protein.  She will work on increasing protein to 80 grams per day.  Exercise goals:  As is.    Behavioral modification strategies: increasing lean protein intake and meal planning and cooking strategies.  Aala has agreed to follow-up with our clinic in 4 weeks, fasting.  Objective:   Blood pressure (!) 118/52, pulse (!) 53, temperature 97.8 F (36.6 C), height 4\' 11"  (1.499 m), weight 126 lb (57.2 kg), SpO2 97 %. Body mass index is 25.45 kg/m.  General: Cooperative, alert, well developed, in no acute distress. HEENT: Conjunctivae and lids unremarkable. Cardiovascular: Regular rhythm.  Lungs: Normal work of breathing. Neurologic:  No focal deficits.   Lab Results  Component Value Date   CREATININE 0.72 10/15/2019   BUN 14 10/15/2019   NA 140 10/15/2019   K 4.7 10/15/2019   CL 100 10/15/2019   CO2 25 10/15/2019   Lab Results  Component Value Date   ALT 32 10/15/2019   AST 51 (H) 10/15/2019   ALKPHOS 106 10/15/2019   BILITOT 0.5 10/15/2019   Lab Results  Component Value Date   HGBA1C 5.1 10/15/2019   HGBA1C 4.8 04/08/2019    HGBA1C 5.2 09/25/2018   HGBA1C 5.7 (H) 01/29/2018   HGBA1C 5.9 07/03/2017   Lab Results  Component Value Date   INSULIN 9.5 10/15/2019   INSULIN 3.4 04/08/2019   INSULIN 3.5 09/25/2018   INSULIN 4.3 01/29/2018   Lab Results  Component Value Date   TSH 2.160 09/25/2018   Lab Results  Component Value Date   CHOL 99 (L) 10/15/2019   HDL 44 10/15/2019   LDLCALC 41 10/15/2019   TRIG 59 10/15/2019   CHOLHDL 2 07/03/2017   Lab Results  Component Value Date   VD25OH 43.5 10/15/2019   VD25OH 49.4 04/08/2019   VD25OH 38.3 09/25/2018   Lab Results  Component Value Date   WBC 4.6 10/15/2019   HGB 11.8 10/15/2019   HCT 37.0 10/15/2019   MCV 99 (H) 10/15/2019   PLT 150 10/15/2019   Lab Results  Component Value Date   IRON 83 10/15/2019   TIBC 295 10/15/2019   FERRITIN 33 10/15/2019    Obesity Behavioral Intervention:   Approximately 15 minutes were spent on the discussion below.  ASK: We discussed the diagnosis of obesity with Necha today and Larena agreed to give [place] permission to discuss obesity behavioral modification therapy today.  ASSESS: Janine has the diagnosis of obesity and her BMI today is 25.5. Shantara is in the action stage of change.   ADVISE: Shaynah was educated on the multiple health risks of obesity as well as the benefit of weight loss to improve her health. She was advised of the need for long term treatment and the importance of lifestyle modifications to improve her current health and to decrease her risk of future health problems.  AGREE: Multiple dietary modification options and treatment options were discussed and Emine agreed to follow the recommendations documented in the above note.  ARRANGE: Karaline was educated on the importance of frequent visits to treat obesity as outlined per CMS and USPSTF guidelines and agreed to schedule her next follow up appointment today.  Attestation Statements:   Reviewed by clinician on day of visit: allergies,  medications, problem list, medical history, surgical history, family history, social history, and previous encounter notes.  I, Water quality scientist, CMA, am acting as Location manager for [employer], [place].  I have reviewed the above documentation for accuracy and completeness, and I agree with the above. -  Georgianne Fick, FNP

## 2020-11-15 NOTE — Progress Notes (Signed)
The ASCVD Risk score (Arnett DK, et al., 2019) failed to calculate for the following reasons:   The valid total cholesterol range is 130 to 320 mg/dL  

## 2020-11-21 DIAGNOSIS — Z713 Dietary counseling and surveillance: Secondary | ICD-10-CM | POA: Diagnosis not present

## 2020-11-21 DIAGNOSIS — Z6827 Body mass index (BMI) 27.0-27.9, adult: Secondary | ICD-10-CM | POA: Diagnosis not present

## 2020-11-21 DIAGNOSIS — Z719 Counseling, unspecified: Secondary | ICD-10-CM | POA: Diagnosis not present

## 2020-11-21 DIAGNOSIS — K529 Noninfective gastroenteritis and colitis, unspecified: Secondary | ICD-10-CM | POA: Diagnosis not present

## 2020-11-21 DIAGNOSIS — T148XXA Other injury of unspecified body region, initial encounter: Secondary | ICD-10-CM | POA: Diagnosis not present

## 2020-11-21 DIAGNOSIS — Z1389 Encounter for screening for other disorder: Secondary | ICD-10-CM | POA: Diagnosis not present

## 2020-11-21 DIAGNOSIS — Z0000 Encounter for general adult medical examination without abnormal findings: Secondary | ICD-10-CM | POA: Diagnosis not present

## 2020-11-21 DIAGNOSIS — L600 Ingrowing nail: Secondary | ICD-10-CM | POA: Diagnosis not present

## 2020-12-13 ENCOUNTER — Ambulatory Visit (INDEPENDENT_AMBULATORY_CARE_PROVIDER_SITE_OTHER): Payer: Medicare Other | Admitting: Family Medicine

## 2020-12-13 ENCOUNTER — Encounter (INDEPENDENT_AMBULATORY_CARE_PROVIDER_SITE_OTHER): Payer: Self-pay | Admitting: Family Medicine

## 2020-12-13 ENCOUNTER — Other Ambulatory Visit: Payer: Self-pay

## 2020-12-13 ENCOUNTER — Other Ambulatory Visit (INDEPENDENT_AMBULATORY_CARE_PROVIDER_SITE_OTHER): Payer: Self-pay | Admitting: Family Medicine

## 2020-12-13 VITALS — BP 117/60 | HR 53 | Temp 98.0°F | Ht 59.0 in | Wt 126.0 lb

## 2020-12-13 DIAGNOSIS — K909 Intestinal malabsorption, unspecified: Secondary | ICD-10-CM | POA: Diagnosis not present

## 2020-12-13 DIAGNOSIS — E559 Vitamin D deficiency, unspecified: Secondary | ICD-10-CM | POA: Diagnosis not present

## 2020-12-13 DIAGNOSIS — E1169 Type 2 diabetes mellitus with other specified complication: Secondary | ICD-10-CM | POA: Diagnosis not present

## 2020-12-13 DIAGNOSIS — E785 Hyperlipidemia, unspecified: Secondary | ICD-10-CM

## 2020-12-13 DIAGNOSIS — Z6837 Body mass index (BMI) 37.0-37.9, adult: Secondary | ICD-10-CM

## 2020-12-13 MED ORDER — VITAMIN D (ERGOCALCIFEROL) 1.25 MG (50000 UNIT) PO CAPS: 50000.0000 [IU] | ORAL_CAPSULE | ORAL | 0 refills | Status: DC

## 2020-12-13 MED ORDER — OZEMPIC (0.25 OR 0.5 MG/DOSE) 2 MG/1.5ML ~~LOC~~ SOPN: 0.2500 mg | PEN_INJECTOR | SUBCUTANEOUS | 0 refills | Status: DC

## 2020-12-13 NOTE — Progress Notes (Signed)
Chief Complaint:   OBESITY [name] is here to discuss her progress with her obesity treatment plan along with follow-up of her obesity related diagnoses. [name] is on practicing portion control and making smarter food choices, such as increasing vegetables and decreasing simple carbohydrates and states she is following her eating plan approximately 100% of the time. [name] states she is active wile watching her grandbaby, and walking for 30-45 minutes 6 times per week.  Today's visit was #: 76 Starting weight: 186 lbs Starting date: 01/28/2018 Today's weight: 126 lbs Today's date: 12/13/2020 Total lbs lost to date: 60 Total lbs lost since last in-office visit: 0  Interim History: [name] has maintained her weight since her last office visit. She is at her goal weight. Her protein intake has improved. She is focusing on her protein at all meals.  Subjective:   1. Type 2 diabetes mellitus with other specified complication, without long-term current use of insulin ([name]) [name]'s diabetes mellitus is very well controlled. Last A1c was 5.1. Her primary care physician took her off of Ozempic, but then [name] started it back because her hunger increased significantly and she gained weight. She started back on Ozempic at 0.25 mg q weekly.  Lab Results  Component Value Date   HGBA1C 5.1 10/15/2019   HGBA1C 4.8 04/08/2019   HGBA1C 5.2 09/25/2018   Lab Results  Component Value Date   MICROALBUR <0.7 07/03/2017   LDLCALC 41 10/15/2019   CREATININE 0.72 10/15/2019   Lab Results  Component Value Date   INSULIN 9.5 10/15/2019   INSULIN 3.4 04/08/2019   INSULIN 3.5 09/25/2018   INSULIN 4.3 01/29/2018   2. Intestinal malabsorption due to hx of duodenal switch [name] is not on iron supplementation (causes upset stomach). She is not on multivitamin. She is status post duodenal switch in 2003.  3. Vitamin D deficiency [name]'s last Vit D level was low at 43.5. She is on prescription Vit D every 3  days.  Lab Results  Component Value Date   VD25OH 43.5 10/15/2019   VD25OH 49.4 04/08/2019   VD25OH 38.3 09/25/2018   4. Hyperlipidemia associated with type 2 diabetes mellitus ([name]) [name]'s last LDL was at goal (44), HDL low at 44, and triglycerides were within normal limits. She is on Crestor 20 mg.  Lab Results  Component Value Date   ALT 32 10/15/2019   AST 51 (H) 10/15/2019   ALKPHOS 106 10/15/2019   BILITOT 0.5 10/15/2019   Lab Results  Component Value Date   CHOL 99 (L) 10/15/2019   HDL 44 10/15/2019   LDLCALC 41 10/15/2019   TRIG 59 10/15/2019   CHOLHDL 2 07/03/2017   Assessment/Plan:   1. Type 2 diabetes mellitus with other specified complication, without long-term current use of insulin (HCC) We will check labs today. Abryana will restart Ozempic 0.25 mg weekly for weight maintenance, and we will refill Ozempic for 1 month.   - Semaglutide,0.25 or 0.5MG /DOS, (OZEMPIC, 0.25 OR 0.5 MG/DOSE,) 2 MG/1.5ML SOPN; Inject 0.25 mg into the skin once a week.  Dispense: 1.5 mL; Refill: 0 - Comprehensive metabolic panel - Hemoglobin A1c - Insulin, random  2. Intestinal malabsorption due to hx of duodenal switch We will check labs today. [name] will start multivitamins with iron BID.  - Vitamin B12 - Folate - Iron and TIBC - Ferritin - CBC with Differential/Platelet - Vitamin B1 - Prealbumin  3. Vitamin D deficiency  We will check labs today, and we will refill prescription  Vitamin D. [name] will follow-up for routine testing of Vitamin D, at least 2-3 times per year to avoid over-replacement.  - VITAMIN D 25 Hydroxy (Vit-D Deficiency, Fractures) - Vitamin D, Ergocalciferol, (DRISDOL) 1.25 MG (50000 UNIT) CAPS capsule; Take 1 capsule (50,000 Units total) by mouth every 3 (three) days.  Dispense: 30 capsule; Refill: 0  4. Hyperlipidemia associated with type 2 diabetes mellitus ([place])  We will check labs today. [name] will continue Crestor 20 mg q daily. She will continue to work  on diet, exercise and weight loss efforts.   - Lipid Panel With LDL/HDL Ratio  5. Overweight: Current BMI 25.44 [name] is currently in the action stage of change. As such, her goal is to maintain weight for now. She has agreed to practicing portion control and making smarter food choices, such as increasing vegetables and decreasing simple carbohydrates.   We will check labs today.   Exercise goals: As is.  Behavioral modification strategies: increasing lean protein intake.  [name] has agreed to follow-up with our clinic in 6 weeks.  [name] was informed we would discuss her lab results at her next visit unless there is a critical issue that needs to be addressed sooner. [name] agreed to keep her next visit at the agreed upon time to discuss these results.  Objective:   Blood pressure 117/60, pulse (!) 53, temperature 98 F (36.7 C), height 4\' 11"  (1.499 m), weight 126 lb (57.2 kg), SpO2 97 %. Body mass index is 25.45 kg/m.  General: Cooperative, alert, well developed, in no acute distress. HEENT: Conjunctivae and lids unremarkable. Cardiovascular: Regular rhythm.  Lungs: Normal work of breathing. Neurologic: No focal deficits.   Lab Results  Component Value Date   CREATININE 0.72 10/15/2019   BUN 14 10/15/2019   NA 140 10/15/2019   K 4.7 10/15/2019   CL 100 10/15/2019   CO2 25 10/15/2019   Lab Results  Component Value Date   ALT 32 10/15/2019   AST 51 (H) 10/15/2019   ALKPHOS 106 10/15/2019   BILITOT 0.5 10/15/2019   Lab Results  Component Value Date   HGBA1C 5.1 10/15/2019   HGBA1C 4.8 04/08/2019   HGBA1C 5.2 09/25/2018   HGBA1C 5.7 (H) 01/29/2018   HGBA1C 5.9 07/03/2017   Lab Results  Component Value Date   INSULIN 9.5 10/15/2019   INSULIN 3.4 04/08/2019   INSULIN 3.5 09/25/2018   INSULIN 4.3 01/29/2018   Lab Results  Component Value Date   TSH 2.160 09/25/2018   Lab Results  Component Value Date   CHOL 99 (L) 10/15/2019   HDL 44 10/15/2019   LDLCALC 41  10/15/2019   TRIG 59 10/15/2019   CHOLHDL 2 07/03/2017   Lab Results  Component Value Date   VD25OH 43.5 10/15/2019   VD25OH 49.4 04/08/2019   VD25OH 38.3 09/25/2018   Lab Results  Component Value Date   WBC 4.6 10/15/2019   HGB 11.8 10/15/2019   HCT 37.0 10/15/2019   MCV 99 (H) 10/15/2019   PLT 150 10/15/2019   Lab Results  Component Value Date   IRON 83 10/15/2019   TIBC 295 10/15/2019   FERRITIN 33 10/15/2019   Attestation Statements:   Reviewed by clinician on day of visit: allergies, medications, problem list, medical history, surgical history, family history, social history, and previous encounter notes.   Wilhemena Durie, am acting as Location manager for [employer], FNP-C.  I have reviewed the above documentation for accuracy and completeness, and I agree with the  above. -  Georgianne Fick, FNP

## 2020-12-13 NOTE — Progress Notes (Signed)
The ASCVD Risk score (Arnett DK, et al., 2019) failed to calculate for the following reasons:   The valid total cholesterol range is 130 to 320 mg/dL  

## 2020-12-14 MED ORDER — STRESS FORMULA/IRON (MVI) PO TABS: 1.0000 | ORAL_TABLET | Freq: Two times a day (BID) | ORAL | 0 refills | Status: DC

## 2020-12-16 LAB — VITAMIN B1: Thiamine: 153.5 nmol/L (ref 66.5–200.0)

## 2020-12-16 LAB — CBC WITH DIFFERENTIAL/PLATELET
Basophils Absolute: 0 10*3/uL (ref 0.0–0.2)
Basos: 1 %
EOS (ABSOLUTE): 0.2 10*3/uL (ref 0.0–0.4)
Eos: 3 %
Hematocrit: 39.1 % (ref 34.0–46.6)
Hemoglobin: 12.3 g/dL (ref 11.1–15.9)
Immature Grans (Abs): 0 10*3/uL (ref 0.0–0.1)
Immature Granulocytes: 0 %
Lymphocytes Absolute: 1.9 10*3/uL (ref 0.7–3.1)
Lymphs: 40 %
MCH: 28.9 pg (ref 26.6–33.0)
MCHC: 31.5 g/dL (ref 31.5–35.7)
MCV: 92 fL (ref 79–97)
Monocytes Absolute: 0.5 10*3/uL (ref 0.1–0.9)
Monocytes: 11 %
Neutrophils Absolute: 2.2 10*3/uL (ref 1.4–7.0)
Neutrophils: 45 %
Platelets: 232 10*3/uL (ref 150–450)
RBC: 4.25 x10E6/uL (ref 3.77–5.28)
RDW: 14.7 % (ref 11.7–15.4)
WBC: 4.8 10*3/uL (ref 3.4–10.8)

## 2020-12-16 LAB — FERRITIN: Ferritin: 30 ng/mL (ref 15–150)

## 2020-12-16 LAB — FOLATE: Folate: 14.5 ng/mL (ref >3.0)

## 2020-12-16 LAB — LIPID PANEL WITH LDL/HDL RATIO
Cholesterol, Total: 110 mg/dL (ref 100–199)
HDL: 49 mg/dL (ref >39)
LDL Chol Calc (NIH): 47 mg/dL (ref 0–99)
LDL/HDL Ratio: 1 ratio (ref 0.0–3.2)
Triglycerides: 64 mg/dL (ref 0–149)
VLDL Cholesterol Cal: 14 mg/dL (ref 5–40)

## 2020-12-16 LAB — IRON AND TIBC
Iron Saturation: 8 % — CL (ref 15–55)
Iron: 35 ug/dL (ref 27–139)
Total Iron Binding Capacity: 445 ug/dL (ref 250–450)
UIBC: 410 ug/dL — ABNORMAL HIGH (ref 118–369)

## 2020-12-16 LAB — VITAMIN D 25 HYDROXY (VIT D DEFICIENCY, FRACTURES): Vit D, 25-Hydroxy: 37.8 ng/mL (ref 30.0–100.0)

## 2020-12-16 LAB — INSULIN, RANDOM: INSULIN: 4.4 u[IU]/mL (ref 2.6–24.9)

## 2020-12-16 LAB — HEMOGLOBIN A1C
Est. average glucose Bld gHb Est-mCnc: 103 mg/dL
Hgb A1c MFr Bld: 5.2 % (ref 4.8–5.6)

## 2020-12-16 LAB — PREALBUMIN: PREALBUMIN: 16 mg/dL (ref 9–32)

## 2020-12-16 LAB — VITAMIN B12: Vitamin B-12: 427 pg/mL (ref 232–1245)

## 2020-12-19 DIAGNOSIS — R32 Unspecified urinary incontinence: Secondary | ICD-10-CM | POA: Diagnosis not present

## 2020-12-28 DIAGNOSIS — R32 Unspecified urinary incontinence: Secondary | ICD-10-CM | POA: Diagnosis not present

## 2020-12-28 DIAGNOSIS — R351 Nocturia: Secondary | ICD-10-CM | POA: Diagnosis not present

## 2020-12-28 DIAGNOSIS — R3915 Urgency of urination: Secondary | ICD-10-CM | POA: Diagnosis not present

## 2020-12-28 DIAGNOSIS — N3946 Mixed incontinence: Secondary | ICD-10-CM | POA: Diagnosis not present

## 2020-12-28 DIAGNOSIS — R350 Frequency of micturition: Secondary | ICD-10-CM | POA: Diagnosis not present

## 2021-01-24 ENCOUNTER — Ambulatory Visit (INDEPENDENT_AMBULATORY_CARE_PROVIDER_SITE_OTHER): Payer: Medicare Other | Admitting: Family Medicine

## 2021-02-20 ENCOUNTER — Ambulatory Visit (INDEPENDENT_AMBULATORY_CARE_PROVIDER_SITE_OTHER): Payer: Medicare Other | Admitting: Family Medicine

## 2021-02-21 ENCOUNTER — Other Ambulatory Visit: Payer: Self-pay

## 2021-02-21 ENCOUNTER — Ambulatory Visit (INDEPENDENT_AMBULATORY_CARE_PROVIDER_SITE_OTHER): Payer: Medicare Other | Admitting: Family Medicine

## 2021-02-21 ENCOUNTER — Encounter (INDEPENDENT_AMBULATORY_CARE_PROVIDER_SITE_OTHER): Payer: Self-pay | Admitting: Family Medicine

## 2021-02-21 VITALS — BP 126/61 | HR 51 | Temp 97.9°F | Ht 59.0 in | Wt 136.0 lb

## 2021-02-21 DIAGNOSIS — D508 Other iron deficiency anemias: Secondary | ICD-10-CM | POA: Diagnosis not present

## 2021-02-21 DIAGNOSIS — E1169 Type 2 diabetes mellitus with other specified complication: Secondary | ICD-10-CM

## 2021-02-21 DIAGNOSIS — E559 Vitamin D deficiency, unspecified: Secondary | ICD-10-CM | POA: Diagnosis not present

## 2021-02-21 DIAGNOSIS — F3289 Other specified depressive episodes: Secondary | ICD-10-CM

## 2021-02-21 DIAGNOSIS — Z6827 Body mass index (BMI) 27.0-27.9, adult: Secondary | ICD-10-CM

## 2021-02-21 DIAGNOSIS — E669 Obesity, unspecified: Secondary | ICD-10-CM

## 2021-02-21 DIAGNOSIS — E7849 Other hyperlipidemia: Secondary | ICD-10-CM

## 2021-02-21 DIAGNOSIS — K9589 Other complications of other bariatric procedure: Secondary | ICD-10-CM

## 2021-02-21 MED ORDER — VITAMIN D (ERGOCALCIFEROL) 1.25 MG (50000 UNIT) PO CAPS: 50000.0000 [IU] | ORAL_CAPSULE | ORAL | 0 refills | Status: DC

## 2021-02-21 MED ORDER — ROSUVASTATIN CALCIUM 20 MG PO TABS: 20.0000 mg | ORAL_TABLET | Freq: Every day | ORAL | 0 refills | Status: DC

## 2021-02-21 MED ORDER — BUPROPION HCL ER (SR) 200 MG PO TB12: 200.0000 mg | ORAL_TABLET | Freq: Every day | ORAL | 0 refills | Status: DC

## 2021-02-21 MED ORDER — OZEMPIC (0.25 OR 0.5 MG/DOSE) 2 MG/1.5ML ~~LOC~~ SOPN: 0.2500 mg | PEN_INJECTOR | SUBCUTANEOUS | 0 refills | Status: DC

## 2021-02-21 NOTE — Progress Notes (Signed)
Chief Complaint:   OBESITY [name] is here to discuss her progress with her obesity treatment plan along with follow-up of her obesity related diagnoses. [name] is on practicing portion control and making smarter food choices, such as increasing vegetables and decreasing simple carbohydrates and states she is following her eating plan approximately 100% of the time. [name] states she is walking for 30-60 minutes 4-5 times per week.  Today's visit was #: 48 Starting weight: 186 lbs Starting date: 01/28/2018 Today's weight: 136 lbs Today's date: 02/21/2021 Total lbs lost to date: 50 lbs Total lbs lost since last in-office visit: 0  Interim History: [name] is up 10 lbs today. I had advised her to gain some weight last OV. She is now at a good goal weight. She feels she needs to increase her protein intake.  She is planning on starting to workout at the gym.   Subjective:   1. Type 2 diabetes mellitus with other specified complication, without long-term current use of insulin (HCC) [name]'s Type 2 diabetes mellitus is well controlled. She denies hypoglycemia. She is on Ozempic 0.25 mg weekly.   Lab Results  Component Value Date   HGBA1C 5.2 12/13/2020   HGBA1C 5.1 10/15/2019   HGBA1C 4.8 04/08/2019   Lab Results  Component Value Date   MICROALBUR <0.7 07/03/2017   LDLCALC 47 12/13/2020   CREATININE 0.72 10/15/2019   Lab Results  Component Value Date   INSULIN 4.4 12/13/2020   INSULIN 9.5 10/15/2019   INSULIN 3.4 04/08/2019   INSULIN 3.5 09/25/2018   INSULIN 4.3 01/29/2018    2. Iron deficiency anemia following bariatric surgery [name] was unable to tolerated multivitamin with iron due to nausea. Iron saturation is very low at 8. Hgb is stable at 12.3. She had a duodenal switch in 2003.   3. Vitamin D deficiency [name]'s Vitamin D is low at 37.8 despite taking prescription Vitamin D 50,000 units 2 times  weekly.   Lab Results  Component Value Date   VD25OH 37.8 12/13/2020   VD25OH  43.5 10/15/2019   VD25OH 49.4 04/08/2019    4. Other hyperlipidemia [name]'s LDL is at goal (40). Her HDL slightly low at (49).  Lab Results  Component Value Date   CHOL 110 12/13/2020   HDL 49 12/13/2020   LDLCALC 47 12/13/2020   TRIG 64 12/13/2020   CHOLHDL 2 07/03/2017   Lab Results  Component Value Date   ALT 32 10/15/2019   AST 51 (H) 10/15/2019   ALKPHOS 106 10/15/2019   BILITOT 0.5 10/15/2019   The ASCVD Risk score (Arnett DK, et al., 2019) failed to calculate for the following reasons:   The valid total cholesterol range is 130 to 320 mg/dL  5. Other depression, with emotional eating [name]'s mood is stable. Her cravings are well controlled.   Assessment/Plan:   1. Type 2 diabetes mellitus with other specified complication, without long-term current use of insulin (HCC) We will refill Ozempic 0.25 mg weekly.  - Semaglutide,0.25 or 0.5MG /DOS, (OZEMPIC, 0.25 OR 0.5 MG/DOSE,) 2 MG/1.5ML SOPN; Inject 0.25 mg into the skin once a week.  Dispense: 3 mL; Refill: 0  2. Iron deficiency anemia following bariatric surgery  I encouraged [name] to try Slow Fe.  3. Vitamin D deficiency We will refill prescription Vitamin D 50,000 IU every week for 1 month with no refills and [name] will follow-up for routine testing of Vitamin D, at least 2-3 times per year to avoid over-replacement.  - Vitamin  D, Ergocalciferol, (DRISDOL) 1.25 MG (50000 UNIT) CAPS capsule; Take 1 capsule (50,000 Units total) by mouth every 3 (three) days.  Dispense: 30 capsule; Refill: 0  4. Other hyperlipidemia Cardiovascular risk and specific lipid/LDL goals reviewed.  [name] will increase exercise. We will refill Crestor 20 mg. - rosuvastatin (CRESTOR) 20 MG tablet; Take 1 tablet (20 mg total) by mouth daily.  Dispense: 90 tablet; Refill: 0  5. Other depression, with emotional eating We will refill bupropion 200 mg every day. Behavior modification techniques were discussed today to help [name] deal with her  emotional/non-hunger eating behaviors.  Orders and follow up as documented in patient record.    - buPROPion (WELLBUTRIN SR) 200 MG 12 hr tablet; Take 1 tablet (200 mg total) by mouth daily.  Dispense: 90 tablet; Refill: 0  6. Overweight: Current BMI 27.45 [name] is currently in the action stage of change. As such, her goal is to continue with weight loss efforts. She has agreed to practicing portion control and making smarter food choices, such as increasing vegetables and decreasing simple carbohydrates plus 80 grams of protein.  [name] agreed on goal weight of 130-135 lbs (26 -27 BMI).  Exercise goals:  [name] will add gym visit a few days per week.  Behavioral modification strategies: increasing lean protein intake and better snacking choices.  [name] has agreed to follow-up with our clinic in 8 weeks (virtual).  Objective:   Blood pressure 126/61, pulse (!) 51, temperature 97.9 F (36.6 C), height 4\' 11"  (1.499 m), weight 136 lb (61.7 kg), SpO2 97 %. Body mass index is 27.47 kg/m.  General: Cooperative, alert, well developed, in no acute distress. HEENT: Conjunctivae and lids unremarkable. Cardiovascular: Regular rhythm.  Lungs: Normal work of breathing. Neurologic: No focal deficits.   Lab Results  Component Value Date   CREATININE 0.72 10/15/2019   BUN 14 10/15/2019   NA 140 10/15/2019   K 4.7 10/15/2019   CL 100 10/15/2019   CO2 25 10/15/2019   Lab Results  Component Value Date   ALT 32 10/15/2019   AST 51 (H) 10/15/2019   ALKPHOS 106 10/15/2019   BILITOT 0.5 10/15/2019   Lab Results  Component Value Date   HGBA1C 5.2 12/13/2020   HGBA1C 5.1 10/15/2019   HGBA1C 4.8 04/08/2019   HGBA1C 5.2 09/25/2018   HGBA1C 5.7 (H) 01/29/2018   Lab Results  Component Value Date   INSULIN 4.4 12/13/2020   INSULIN 9.5 10/15/2019   INSULIN 3.4 04/08/2019   INSULIN 3.5 09/25/2018   INSULIN 4.3 01/29/2018   Lab Results  Component Value Date   TSH 2.160 09/25/2018   Lab  Results  Component Value Date   CHOL 110 12/13/2020   HDL 49 12/13/2020   LDLCALC 47 12/13/2020   TRIG 64 12/13/2020   CHOLHDL 2 07/03/2017   Lab Results  Component Value Date   VD25OH 37.8 12/13/2020   VD25OH 43.5 10/15/2019   VD25OH 49.4 04/08/2019   Lab Results  Component Value Date   WBC 4.8 12/13/2020   HGB 12.3 12/13/2020   HCT 39.1 12/13/2020   MCV 92 12/13/2020   PLT 232 12/13/2020   Lab Results  Component Value Date   IRON 35 12/13/2020   TIBC 445 12/13/2020   FERRITIN 30 12/13/2020   Attestation Statements:   Reviewed by clinician on day of visit: allergies, medications, problem list, medical history, surgical history, family history, social history, and previous encounter notes.  Time spent on visit including pre-visit chart review  and post-visit care and charting was 45 minutes.   I, Lizbeth Bark, RMA, am acting as Location manager for [employer], [place].  I have reviewed the above documentation for accuracy and completeness, and I agree with the above. -  Georgianne Fick, FNP

## 2021-02-22 ENCOUNTER — Encounter (INDEPENDENT_AMBULATORY_CARE_PROVIDER_SITE_OTHER): Payer: Self-pay | Admitting: Family Medicine

## 2021-04-17 NOTE — Progress Notes (Addendum)
?TeleHealth Visit:  ?Due to the COVID-19 pandemic, this visit was completed with telemedicine (audio/video) technology to reduce patient and provider exposure as well as to preserve personal protective equipment.  ? ?[name] has verbally consented to this TeleHealth visit. The patient is located at home, the provider is located at home. The participants in this visit include the listed provider and patient. The visit was conducted today via MyChart video. ? ?OBESITY ?[name] is here to discuss her progress with her obesity treatment plan along with follow-up of her obesity related diagnoses.  ? ?Today's visit was # 36 ?Starting weight: 186 lbs ?Starting date: 01/28/2018 ?Weight at last in person visit:136 lbs (at goal of 135-140 lbs)  ?Today's reported weight: 138 lbs (27 BMI) ?[name] would like to keep her weight closer to 135 pounds. ? ?Nutrition Plan: practicing portion control and making smarter food choices, such as increasing vegetables and decreasing simple carbohydrates. Protein goal of 80 gms/day. ?Hunger is moderately controlled. Cravings are poorly controlled.  ?Current exercise:  Does bike and treadmill at gym 2-3 days per week. Walks 2 days per week.  ? ?Interim History: [name] reports she has really been struggling over the past several weeks with stress eating.  She feels that her cravings have not been well controlled.  She notes that protein intake has not been good.  However [name] is still within her goal weight range of 135-140 pounds. ?All protein drinks nauseate her. Greek yogurt is "too strong" for her.  She does not tolerate dairy products very well since her duodenal switch in 2003.  ?She has a question about using collagen peptides to supplement protein intake. ? ?Assessment/Plan:  ?Type II Diabetes ?HgbA1c is at goal. ?CBGs: no ?Any episodes of hypoglycemia? no ?Medication(s): Ozempic 0.25 mg weekly ? ?Lab Results  ?Component Value Date  ? HGBA1C 5.2 12/13/2020  ? HGBA1C 5.1 10/15/2019  ?  HGBA1C 4.8 04/08/2019  ? ?Lab Results  ?Component Value Date  ? MICROALBUR <0.7 07/03/2017  ? [place] 47 12/13/2020  ? CREATININE 0.72 10/15/2019  ? ? ?Plan: ?Increase dose of Ozempic to 0.5 mg weekly. ?Check A1c. ? ?2. Vitamin D Deficiency ?Vitamin D is not at goal of 50. She is on bi-weekly prescription Vitamin D 50,000 IU.  Has history of duodenal switch in 2003.  She has chronically low vitamin D even with 50,000 IU twice weekly. ?Lab Results  ?Component Value Date  ? VD25OH 37.8 12/13/2020  ? VD25OH 43.5 10/15/2019  ? VD25OH 49.4 04/08/2019  ? ?Plan: ?Continue prescription vitamin D 50,000 IU every 3 days. ?Check vitamin D level. ? ?3. Iron deficiency anemia ?Anemia is stable. ?Iron supplementation: None. Unable to take oral iron due to stomach upset. ?Lab Results  ?Component Value Date  ? WBC 4.8 12/13/2020  ? HGB 12.3 12/13/2020  ? HCT 39.1 12/13/2020  ? MCV 92 12/13/2020  ? PLT 232 12/13/2020  ? ?Lab Results  ?Component Value Date  ? IRON 35 12/13/2020  ? TIBC 445 12/13/2020  ? FERRITIN 30 12/13/2020  ?  ? ?Plan: ?Check anemia panel and CBC. ? ? ?4.  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  He is a patient at our clinic also.  Denies suicidal or homicidal ideation.  ?Medications: Bupropion 200 mg daily.  Cymbalta 60 mg daily and Xanax 0.25 mg as needed ? ?Plan: ?Discussed  strategies to combat emotional eating.  Continue all medications as ordered. ?We will consider referring to Dr. Mallie Mussel next office visit. ? ?5. Obesity: Current BMI 27.85 ?[name] is currently in the action stage of change. As such, her goal is to continue with weight loss efforts. She has agreed to practicing portion control and making smarter food choices, such as increasing vegetables and decreasing simple carbohydrates.  ? ?Exercise goals: Encouraged her to use the weight machines at the  gym. ? ?Behavioral modification strategies: increasing lean protein intake, meal planning and cooking strategies, and better snacking choices. ?Discussed high protein food options. ?She may supplement with collagen peptides once daily. ? ?[name] has agreed to follow-up with our clinic in 4 weeks.  ? ?Orders Placed This Encounter  ?Procedures  ? Vitamin B12  ? Folate  ? Iron and TIBC  ? Ferritin  ? Comprehensive metabolic panel  ? CBC with Differential/Platelet  ? Hemoglobin A1c  ? VITAMIN D 25 Hydroxy (Vit-D Deficiency, Fractures)  ? ? ?Medications Discontinued During This Encounter  ?Medication Reason  ? Semaglutide,0.25 or 0.'5MG'$ /DOS, (OZEMPIC, 0.25 OR 0.5 MG/DOSE,) 2 MG/1.5ML SOPN Reorder  ?  ? ?Meds ordered this encounter  ?Medications  ? Semaglutide,0.25 or 0.'5MG'$ /DOS, (OZEMPIC, 0.25 OR 0.5 MG/DOSE,) 2 MG/1.5ML SOPN  ?  Sig: Inject 0.5 mg into the skin once a week.  ?  Dispense:  4.5 mL  ?  Refill:  0  ?  Order Specific Question:   Supervising Provider  ?  Answer:   Dennard Nip D [[id number]]  ?   ? ?Objective:  ? ?VITALS: Per patient if applicable, see vitals. ?GENERAL: Alert and in no acute distress. ?CARDIOPULMONARY: No increased WOB. Speaking in clear sentences.  ?PSYCH: Pleasant and cooperative. Speech normal rate and rhythm. Affect is appropriate. Insight and judgement are appropriate. Attention is focused, linear, and appropriate.  ?NEURO: Oriented as arrived to appointment on time with no prompting.  ? ?Lab Results  ?Component Value Date  ? CREATININE 0.72 10/15/2019  ? BUN 14 10/15/2019  ? NA 140 10/15/2019  ? K 4.7 10/15/2019  ? CL 100 10/15/2019  ? CO2 25 10/15/2019  ? ?Lab Results  ?Component Value Date  ? ALT 32 10/15/2019  ? AST 51 (H) 10/15/2019  ? ALKPHOS 106 10/15/2019  ? BILITOT 0.5 10/15/2019  ? ?Lab Results  ?Component Value Date  ? HGBA1C 5.2 12/13/2020  ? HGBA1C 5.1 10/15/2019  ? HGBA1C 4.8 04/08/2019  ? HGBA1C 5.2 09/25/2018  ? HGBA1C 5.7 (H) 01/29/2018  ? ?Lab Results  ?Component Value Date   ? INSULIN 4.4 12/13/2020  ? INSULIN 9.5 10/15/2019  ? INSULIN 3.4 04/08/2019  ? INSULIN 3.5 09/25/2018  ? INSULIN 4.3 01/29/2018  ? ?Lab Results  ?Component Value Date  ? TSH 2.160 09/25/2018  ? ?Lab Results  ?Component Value Date  ? CHOL 110 12/13/2020  ? HDL 49 12/13/2020  ? [place] 47 12/13/2020  ? TRIG 64 12/13/2020  ? CHOLHDL 2 07/03/2017  ? ?Lab Results  ?Component Value Date  ? WBC 4.8 12/13/2020  ? HGB 12.3 12/13/2020  ? HCT 39.1 12/13/2020  ? MCV 92 12/13/2020  ? PLT 232 12/13/2020  ? ?Lab Results  ?Component Value Date  ? IRON 35 12/13/2020  ? TIBC 445 12/13/2020  ? FERRITIN 30 12/13/2020  ? ?Lab Results  ?Component Value Date  ? VD25OH 37.8 12/13/2020  ? VD25OH 43.5 10/15/2019  ? VD25OH 49.4 04/08/2019  ? ? ?Attestation Statements:  ? ?Reviewed by clinician  on day of visit: allergies, medications, problem list, medical history, surgical history, family history, social history, and previous encounter notes. ? ? ? ?

## 2021-04-18 ENCOUNTER — Telehealth (INDEPENDENT_AMBULATORY_CARE_PROVIDER_SITE_OTHER): Payer: Medicare Other | Admitting: Family Medicine

## 2021-04-18 ENCOUNTER — Encounter (INDEPENDENT_AMBULATORY_CARE_PROVIDER_SITE_OTHER): Payer: Self-pay | Admitting: Family Medicine

## 2021-04-18 DIAGNOSIS — F3289 Other specified depressive episodes: Secondary | ICD-10-CM | POA: Diagnosis not present

## 2021-04-18 DIAGNOSIS — E669 Obesity, unspecified: Secondary | ICD-10-CM

## 2021-04-18 DIAGNOSIS — E559 Vitamin D deficiency, unspecified: Secondary | ICD-10-CM

## 2021-04-18 DIAGNOSIS — E1169 Type 2 diabetes mellitus with other specified complication: Secondary | ICD-10-CM | POA: Diagnosis not present

## 2021-04-18 DIAGNOSIS — D508 Other iron deficiency anemias: Secondary | ICD-10-CM

## 2021-04-18 DIAGNOSIS — Z6827 Body mass index (BMI) 27.0-27.9, adult: Secondary | ICD-10-CM

## 2021-04-18 DIAGNOSIS — K9589 Other complications of other bariatric procedure: Secondary | ICD-10-CM

## 2021-04-18 MED ORDER — OZEMPIC (0.25 OR 0.5 MG/DOSE) 2 MG/1.5ML ~~LOC~~ SOPN: 0.5000 mg | PEN_INJECTOR | SUBCUTANEOUS | 0 refills | Status: DC

## 2021-04-19 ENCOUNTER — Other Ambulatory Visit (INDEPENDENT_AMBULATORY_CARE_PROVIDER_SITE_OTHER): Payer: Self-pay | Admitting: Family Medicine

## 2021-04-19 DIAGNOSIS — E1169 Type 2 diabetes mellitus with other specified complication: Secondary | ICD-10-CM

## 2021-04-19 MED ORDER — OZEMPIC (0.25 OR 0.5 MG/DOSE) 2 MG/3ML ~~LOC~~ SOPN: 0.5000 mg | PEN_INJECTOR | SUBCUTANEOUS | 0 refills | Status: DC

## 2021-05-05 LAB — CBC WITH DIFFERENTIAL/PLATELET
Basophils Absolute: 0.1 10*3/uL (ref 0.0–0.2)
Basos: 1 %
EOS (ABSOLUTE): 0.2 10*3/uL (ref 0.0–0.4)
Eos: 5 %
Hematocrit: 36.5 % (ref 34.0–46.6)
Hemoglobin: 11.1 g/dL (ref 11.1–15.9)
Immature Grans (Abs): 0 10*3/uL (ref 0.0–0.1)
Immature Granulocytes: 0 %
Lymphocytes Absolute: 2 10*3/uL (ref 0.7–3.1)
Lymphs: 43 %
MCH: 26.7 pg (ref 26.6–33.0)
MCHC: 30.4 g/dL — ABNORMAL LOW (ref 31.5–35.7)
MCV: 88 fL (ref 79–97)
Monocytes Absolute: 0.5 10*3/uL (ref 0.1–0.9)
Monocytes: 11 %
Neutrophils Absolute: 1.8 10*3/uL (ref 1.4–7.0)
Neutrophils: 40 %
Platelets: 158 10*3/uL (ref 150–450)
RBC: 4.15 x10E6/uL (ref 3.77–5.28)
RDW: 15.2 % (ref 11.7–15.4)
WBC: 4.5 10*3/uL (ref 3.4–10.8)

## 2021-05-05 LAB — IRON AND TIBC
Iron Saturation: 11 % — ABNORMAL LOW (ref 15–55)
Iron: 49 ug/dL (ref 27–139)
Total Iron Binding Capacity: 449 ug/dL (ref 250–450)
UIBC: 400 ug/dL — ABNORMAL HIGH (ref 118–369)

## 2021-05-05 LAB — FOLATE: Folate: 16.5 ng/mL (ref >3.0)

## 2021-05-05 LAB — FERRITIN: Ferritin: 11 ng/mL — ABNORMAL LOW (ref 15–150)

## 2021-05-05 LAB — COMPREHENSIVE METABOLIC PANEL
ALT: 51 IU/L — ABNORMAL HIGH (ref 0–32)
AST: 61 IU/L — ABNORMAL HIGH (ref 0–40)
Albumin/Globulin Ratio: 3.2 — ABNORMAL HIGH (ref 1.2–2.2)
Albumin: 4.5 g/dL (ref 3.7–4.7)
Alkaline Phosphatase: 68 IU/L (ref 44–121)
BUN/Creatinine Ratio: 25 (ref 12–28)
BUN: 20 mg/dL (ref 8–27)
Bilirubin Total: 0.4 mg/dL (ref 0.0–1.2)
CO2: 22 mmol/L (ref 20–29)
Calcium: 8 mg/dL — ABNORMAL LOW (ref 8.7–10.3)
Chloride: 106 mmol/L (ref 96–106)
Creatinine, Ser: 0.8 mg/dL (ref 0.57–1.00)
Globulin, Total: 1.4 g/dL — ABNORMAL LOW (ref 1.5–4.5)
Glucose: 78 mg/dL (ref 70–99)
Potassium: 4.6 mmol/L (ref 3.5–5.2)
Sodium: 143 mmol/L (ref 134–144)
Total Protein: 5.9 g/dL — ABNORMAL LOW (ref 6.0–8.5)
eGFR: 77 mL/min/{1.73_m2} (ref >59)

## 2021-05-05 LAB — VITAMIN B12: Vitamin B-12: 334 pg/mL (ref 232–1245)

## 2021-05-05 LAB — HEMOGLOBIN A1C
Est. average glucose Bld gHb Est-mCnc: 114 mg/dL
Hgb A1c MFr Bld: 5.6 % (ref 4.8–5.6)

## 2021-05-05 LAB — VITAMIN D 25 HYDROXY (VIT D DEFICIENCY, FRACTURES): Vit D, 25-Hydroxy: 40.6 ng/mL (ref 30.0–100.0)

## 2021-05-14 NOTE — Progress Notes (Signed)
?TeleHealth Visit:  ?Due to the COVID-19 pandemic, this visit was completed with telemedicine (audio/video) technology to reduce patient and provider exposure as well as to preserve personal protective equipment.  ? ?[name] has verbally consented to this TeleHealth visit. The patient is located at home, the provider is located at home. The participants in this visit include the listed provider and patient. The visit was conducted today via MyChart video. ? ?OBESITY ?[name] is here to discuss her progress with her obesity treatment plan along with follow-up of her obesity related diagnoses.  ? ?Today's visit was # 37 ?Starting weight: 186 lbs ?Starting date: 01/28/2018 ?Total weight loss: 46 lbs  ?Weight reported at last video visit: 138 lbs  ?Today's reported weight: 140 lbs  ?Weight change since last visit: +2 ? ? ?Nutrition Plan: practicing portion control and making smarter food choices, such as increasing vegetables and decreasing simple carbohydrates.  ?Hunger is well controlled. Cravings are poorly controlled.  ?Current exercise: walking a little. Has been off normal routine since death of mother-in-law within the last 2 weeks. ? ?Interim History:[name] says she has been stress eating due to the recent death of her mother-in law.  She also missed her dose of Ozempic last week. ?She is disappointed because she has gained 2 pounds.  ?She reports that her protein and water intake are both low. ?Goal weight is between 135 and 140 pounds. ? ?Assessment/Plan:  ?Type II Diabetes ?HgbA1c is at goal. ?Current monitoring regimen: none ?Any episodes of hypoglycemia? no ?Medication(s): Ozempic 0.5 mg weekly. Did not have her dose last week.  ? ?Lab Results  ?Component Value Date  ? HGBA1C 5.6 05/04/2021  ? HGBA1C 5.2 12/13/2020  ? HGBA1C 5.1 10/15/2019  ? ?Lab Results  ?Component Value Date  ? MICROALBUR <0.7 07/03/2017  ? [place] 47 12/13/2020  ? CREATININE 0.80 05/04/2021  ? ? ?Plan: ?Resume Ozempic at 0.5 mg. ? ?2.  Iron deficiency anemia following bariatric surgery ?Anemia is not stable.  Ferritin is 11 and hemoglobin is low normal at 11.1. ?Iron supplementation: None ?She is unable to tolerate any oral iron.  She has had an iron infusion in the past when her ferritin level got down to 4. ?Lab Results  ?Component Value Date  ? WBC 4.5 05/04/2021  ? HGB 11.1 05/04/2021  ? HCT 36.5 05/04/2021  ? MCV 88 05/04/2021  ? PLT 158 05/04/2021  ? ?Lab Results  ?Component Value Date  ? IRON 49 05/04/2021  ? TIBC 449 05/04/2021  ? FERRITIN 11 (L) 05/04/2021  ?  ? ?Plan: ?[name] has an appointment with her PCP next week.  She will discuss her recent lab results with her PCP.  I will have a copy sent to: ?Marlis Edelson, NP, [place], [place] ? ?3.  Elevated liver enzymes ?Liver enzymes are mildly elevated.  She is on a statin but has been on this for a while.  Denies abdominal pain. ? ?Lab Results  ?Component Value Date  ? ALT 51 (H) 05/04/2021  ? AST 61 (H) 05/04/2021  ? ALKPHOS 68 05/04/2021  ? BILITOT 0.4 05/04/2021  ? ? ? ?Plan: ?I advised her to discuss this with her PCP at upcoming visit. ? ?4. Vitamin D Deficiency ?Vitamin D is not at goal of 50. She is on twice weekly prescription Vitamin D 50,000 IU.  This dose is what she needs for maintenance.  She is status post duodenal switch. ?Lab Results  ?Component Value Date  ? VD25OH 40.6 05/04/2021  ?  VD25OH 37.8 12/13/2020  ? VD25OH 43.5 10/15/2019  ? ? ?Plan: ?Continue prescription vitamin D 50,000 IU twice weekly. ? ?5. Obesity: Current BMI 28.26 ?[name] is currently in the action stage of change. As such, her goal is to continue with weight loss efforts.  ?She has agreed to practicing portion control and making smarter food choices, such as increasing vegetables and decreasing simple carbohydrates.  ? ?Exercise goals: She plans on getting back to her usual routine after her mother-in-law's funeral. ? ?Behavioral modification strategies: increasing lean protein intake  and increasing water intake. ? ?[name] has agreed to follow-up with our clinic in 3 weeks.  ? ?No orders of the defined types were placed in this encounter. ? ? ?There are no discontinued medications.  ? ?No orders of the defined types were placed in this encounter. ?   ? ?Objective:  ? ?VITALS: Per patient if applicable, see vitals. ?GENERAL: Alert and in no acute distress. ?CARDIOPULMONARY: No increased WOB. Speaking in clear sentences.  ?PSYCH: Pleasant and cooperative. Speech normal rate and rhythm. Affect is appropriate. Insight and judgement are appropriate. Attention is focused, linear, and appropriate.  ?NEURO: Oriented as arrived to appointment on time with no prompting.  ? ?Lab Results  ?Component Value Date  ? CREATININE 0.80 05/04/2021  ? BUN 20 05/04/2021  ? NA 143 05/04/2021  ? K 4.6 05/04/2021  ? CL 106 05/04/2021  ? CO2 22 05/04/2021  ? ?Lab Results  ?Component Value Date  ? ALT 51 (H) 05/04/2021  ? AST 61 (H) 05/04/2021  ? ALKPHOS 68 05/04/2021  ? BILITOT 0.4 05/04/2021  ? ?Lab Results  ?Component Value Date  ? HGBA1C 5.6 05/04/2021  ? HGBA1C 5.2 12/13/2020  ? HGBA1C 5.1 10/15/2019  ? HGBA1C 4.8 04/08/2019  ? HGBA1C 5.2 09/25/2018  ? ?Lab Results  ?Component Value Date  ? INSULIN 4.4 12/13/2020  ? INSULIN 9.5 10/15/2019  ? INSULIN 3.4 04/08/2019  ? INSULIN 3.5 09/25/2018  ? INSULIN 4.3 01/29/2018  ? ?Lab Results  ?Component Value Date  ? TSH 2.160 09/25/2018  ? ?Lab Results  ?Component Value Date  ? CHOL 110 12/13/2020  ? HDL 49 12/13/2020  ? [place] 47 12/13/2020  ? TRIG 64 12/13/2020  ? CHOLHDL 2 07/03/2017  ? ?Lab Results  ?Component Value Date  ? WBC 4.5 05/04/2021  ? HGB 11.1 05/04/2021  ? HCT 36.5 05/04/2021  ? MCV 88 05/04/2021  ? PLT 158 05/04/2021  ? ?Lab Results  ?Component Value Date  ? IRON 49 05/04/2021  ? TIBC 449 05/04/2021  ? FERRITIN 11 (L) 05/04/2021  ? ?Lab Results  ?Component Value Date  ? VD25OH 40.6 05/04/2021  ? VD25OH 37.8 12/13/2020  ? VD25OH 43.5 10/15/2019  ? ? ?Attestation  Statements:  ? ?Reviewed by clinician on day of visit: allergies, medications, problem list, medical history, surgical history, family history, social history, and previous encounter notes. ?Time spent on visit including pre-visit chart review and post-visit charting and care was 35 minutes.  ? ? ?

## 2021-05-15 ENCOUNTER — Telehealth (INDEPENDENT_AMBULATORY_CARE_PROVIDER_SITE_OTHER): Payer: Medicare Other | Admitting: Family Medicine

## 2021-05-15 ENCOUNTER — Encounter (INDEPENDENT_AMBULATORY_CARE_PROVIDER_SITE_OTHER): Payer: Self-pay | Admitting: Family Medicine

## 2021-05-15 VITALS — Ht 59.0 in | Wt 140.0 lb

## 2021-05-15 DIAGNOSIS — E1169 Type 2 diabetes mellitus with other specified complication: Secondary | ICD-10-CM

## 2021-05-15 DIAGNOSIS — E559 Vitamin D deficiency, unspecified: Secondary | ICD-10-CM

## 2021-05-15 DIAGNOSIS — Z7985 Long-term (current) use of injectable non-insulin antidiabetic drugs: Secondary | ICD-10-CM

## 2021-05-15 DIAGNOSIS — D508 Other iron deficiency anemias: Secondary | ICD-10-CM

## 2021-05-15 DIAGNOSIS — R748 Abnormal levels of other serum enzymes: Secondary | ICD-10-CM

## 2021-05-15 DIAGNOSIS — K9589 Other complications of other bariatric procedure: Secondary | ICD-10-CM | POA: Diagnosis not present

## 2021-05-15 DIAGNOSIS — E669 Obesity, unspecified: Secondary | ICD-10-CM

## 2021-05-15 DIAGNOSIS — Z6828 Body mass index (BMI) 28.0-28.9, adult: Secondary | ICD-10-CM

## 2021-05-17 ENCOUNTER — Other Ambulatory Visit (INDEPENDENT_AMBULATORY_CARE_PROVIDER_SITE_OTHER): Payer: Self-pay | Admitting: Family Medicine

## 2021-05-17 DIAGNOSIS — E1169 Type 2 diabetes mellitus with other specified complication: Secondary | ICD-10-CM

## 2021-05-17 DIAGNOSIS — E119 Type 2 diabetes mellitus without complications: Secondary | ICD-10-CM

## 2021-05-17 MED ORDER — OZEMPIC (0.25 OR 0.5 MG/DOSE) 2 MG/1.5ML ~~LOC~~ SOPN: 0.5000 mg | PEN_INJECTOR | SUBCUTANEOUS | 0 refills | Status: DC

## 2021-06-01 NOTE — Progress Notes (Addendum)
TeleHealth Visit:  This visit was completed with telemedicine (audio/video) technology. [name] has verbally consented to this TeleHealth visit. The patient is located at home, the provider is located at home. The participants in this visit include the listed provider and patient. The visit was conducted today via MyChart video.  OBESITY [name] is here to discuss her progress with her obesity treatment plan along with follow-up of her obesity related diagnoses.   Today's visit was # 17 Starting weight: 186 lbs Starting date: 01/28/2018 Weight reported at last video visit: 140 lbs  Total weight loss: 44 lbs  Today's reported weight: 142 lbs Weight change since last visit: +2  Nutrition Plan: practicing portion control and making smarter food choices, such as increasing vegetables and decreasing simple carbohydrates.  Hunger is well controlled. Cravings are moderately controlled.  Current exercise:  very active   Interim History: [name] is frustrated because her weight seems to be in an upward trend.  She likes to keep her weight around 135 pounds.  She reports she sometimes skips lunch.  She also has 212 ounce cans of Coke per day. She is very active but has not been doing any intentional exercise.  Assessment/Plan:  1. Other depression/emotional eating Tracy has had issues with stress/emotional eating. Currently this is moderately controlled. Overall mood is stable. Denies suicidal/homicidal ideation. Medication(s): bupropion 200 mg daily  Plan: Refill:  buPROPion (WELLBUTRIN SR) 200 MG 12 hr tablet    Sig: Take 1 tablet (200 mg total) by mouth daily.    Dispense:  90 tablet    Refill:  0    Order Specific Question:   Supervising Provider    Answer:   Dennard Nip D [[id number]]      2. Iron deficiency anemia following bariatric surgery [name] is status post duodenal switch in 2003.  She has had chronic anemia since then.   She is unable to tolerate oral iron due to upset  stomach.  She has recently started iron infusions per PCP.  Plan:  Follow-up with PCP as directed.   Continue iron infusions as directed.  3. Obesity: Current BMI 28.67 [name] is currently in the action stage of change. As such, her goal is to continue with weight loss efforts.  She has agreed to keeping a food journal and adhering to recommended goals of 1200-1300 calories and 80 gms protein.   Exercise goals: encouraged to go to the gym twice weekly and do a combination of cardio and resistance.  Behavioral modification strategies: increasing lean protein intake, decreasing liquid calories, and keeping a strict food journal. MyChart message sent with information about journaling. Encouraged her to reduce Coke intake to 8 ounces per day.  [name] has agreed to follow-up with our clinic in 3 weeks.   No orders of the defined types were placed in this encounter.   Medications Discontinued During This Encounter  Medication Reason   buPROPion (WELLBUTRIN SR) 200 MG 12 hr tablet Reorder     Meds ordered this encounter  Medications   buPROPion (WELLBUTRIN SR) 200 MG 12 hr tablet    Sig: Take 1 tablet (200 mg total) by mouth daily.    Dispense:  90 tablet    Refill:  0    Order Specific Question:   Supervising Provider    Answer:   Dennard Nip D [[id number]]      Objective:   VITALS: Per patient if applicable, see vitals. GENERAL: Alert and in no acute distress. CARDIOPULMONARY: No increased WOB. Speaking in  clear sentences.  PSYCH: Pleasant and cooperative. Speech normal rate and rhythm. Affect is appropriate. Insight and judgement are appropriate. Attention is focused, linear, and appropriate.  NEURO: Oriented as arrived to appointment on time with no prompting.   Lab Results  Component Value Date   CREATININE 0.80 05/04/2021   BUN 20 05/04/2021   NA 143 05/04/2021   K 4.6 05/04/2021   CL 106 05/04/2021   CO2 22 05/04/2021   Lab Results  Component Value Date   ALT 51 (H)  05/04/2021   AST 61 (H) 05/04/2021   ALKPHOS 68 05/04/2021   BILITOT 0.4 05/04/2021   Lab Results  Component Value Date   HGBA1C 5.6 05/04/2021   HGBA1C 5.2 12/13/2020   HGBA1C 5.1 10/15/2019   HGBA1C 4.8 04/08/2019   HGBA1C 5.2 09/25/2018   Lab Results  Component Value Date   INSULIN 4.4 12/13/2020   INSULIN 9.5 10/15/2019   INSULIN 3.4 04/08/2019   INSULIN 3.5 09/25/2018   INSULIN 4.3 01/29/2018   Lab Results  Component Value Date   TSH 2.160 09/25/2018   Lab Results  Component Value Date   CHOL 110 12/13/2020   HDL 49 12/13/2020   LDLCALC 47 12/13/2020   TRIG 64 12/13/2020   CHOLHDL 2 07/03/2017   Lab Results  Component Value Date   WBC 4.5 05/04/2021   HGB 11.1 05/04/2021   HCT 36.5 05/04/2021   MCV 88 05/04/2021   PLT 158 05/04/2021   Lab Results  Component Value Date   IRON 49 05/04/2021   TIBC 449 05/04/2021   FERRITIN 11 (L) 05/04/2021   Lab Results  Component Value Date   VD25OH 40.6 05/04/2021   VD25OH 37.8 12/13/2020   VD25OH 43.5 10/15/2019    Attestation Statements:   Reviewed by clinician on day of visit: allergies, medications, problem list, medical history, surgical history, family history, social history, and previous encounter notes.

## 2021-06-05 ENCOUNTER — Telehealth (INDEPENDENT_AMBULATORY_CARE_PROVIDER_SITE_OTHER): Payer: Medicare Other | Admitting: Family Medicine

## 2021-06-05 ENCOUNTER — Encounter (INDEPENDENT_AMBULATORY_CARE_PROVIDER_SITE_OTHER): Payer: Self-pay | Admitting: Family Medicine

## 2021-06-05 ENCOUNTER — Encounter (INDEPENDENT_AMBULATORY_CARE_PROVIDER_SITE_OTHER): Payer: Self-pay

## 2021-06-05 VITALS — Ht 59.0 in | Wt 142.0 lb

## 2021-06-05 DIAGNOSIS — F3289 Other specified depressive episodes: Secondary | ICD-10-CM | POA: Diagnosis not present

## 2021-06-05 DIAGNOSIS — K9589 Other complications of other bariatric procedure: Secondary | ICD-10-CM | POA: Diagnosis not present

## 2021-06-05 DIAGNOSIS — D508 Other iron deficiency anemias: Secondary | ICD-10-CM

## 2021-06-05 DIAGNOSIS — Z6837 Body mass index (BMI) 37.0-37.9, adult: Secondary | ICD-10-CM

## 2021-06-05 MED ORDER — BUPROPION HCL ER (SR) 200 MG PO TB12: 200.0000 mg | ORAL_TABLET | Freq: Every day | ORAL | 0 refills | Status: DC

## 2021-06-26 NOTE — Progress Notes (Signed)
TeleHealth Visit:  This visit was completed with telemedicine (audio/video) technology. [name] has verbally consented to this TeleHealth visit. The patient is located at home, the provider is located at home. The participants in this visit include the listed provider and patient. The visit was conducted today via MyChart video.  OBESITY [name] is here to discuss her progress with her obesity treatment plan along with follow-up of her obesity related diagnoses.   Today's visit was # 39 Starting weight: 186 lbs Starting date: 01/28/2018 Weight reported at last video visit: 142 lbs  Total weight loss: 46 lbs  Today's reported weight: 140 lbs Weight change since last visit: -2  Nutrition Plan: keeping a food journal and adhering to recommended goals of 1200-1300 calories and 80 gms protein.  Hunger is well controlled.  Current exercise: none, but has been very active.  Interim History:  [name] is working on weight maintenance.  Goal weight range is 135 to 140 pounds.  Today she is 140 pounds.   She does all virtual visits because she lives in [place] city.  [name] has not been journaling as discussed at last visit but is down a few pounds.   She has mostly cut out regular soda since her last office visit.  She has found that she likes Coke Zero after trying it.   She has been focusing on increasing her protein intake.  Generally has 1 protein shake per day.  She eats plenty of fruits and vegetables. She and her husband tend to eat out several days per week but she makes healthy choices. She is not currently exercising but is very busy with church activities.  Assessment/Plan:  1. Iron deficiency anemia following bariatric surgery Anemia is stable.  Most recent hemoglobin is 10.7 and ferritin was 36-05/23/2021 labs done by PCP.  She is status post duodenal switch in 2003. Iron supplementation: She has been receiving iron infusions.  Unable to tolerate oral iron.  Reports her energy level  has improved quite a bit. Lab Results  Component Value Date   WBC 4.5 05/04/2021   HGB 11.1 05/04/2021   HCT 36.5 05/04/2021   MCV 88 05/04/2021   PLT 158 05/04/2021   Lab Results  Component Value Date   IRON 49 05/04/2021   TIBC 449 05/04/2021   FERRITIN 11 (L) 05/04/2021     Plan: Follow-up with PCP for management.  2. Type II Diabetes HgbA1c is at goal. CBGs: Not checking Medication(s): Ozempic 0.5 mg weekly.  Appetite well controlled.  Lab Results  Component Value Date   HGBA1C 5.6 05/04/2021   HGBA1C 5.2 12/13/2020   HGBA1C 5.1 10/15/2019   Lab Results  Component Value Date   MICROALBUR <0.7 07/03/2017   LDLCALC 47 12/13/2020   CREATININE 0.80 05/04/2021    Plan: Continue Ozempic 0.5 mg weekly.   3. Obesity: Current BMI 28.26 [name] is currently in the action stage of change. As such, her goal is to maintain weight for now.  She has agreed to keeping a food journal and adhering to recommended goals of 1200-1300 calories and 70-80 gms protein.   I advised her if she is unable to track all food she should at least track her protein intake.  Exercise goals: Encouraged her to resume exercise for fitness/health reasons.  Discussed importance of maintaining muscle mass through strength training. Encouraged even a few short sessions of hand weights weekly and some walks.  Behavioral modification strategies: increasing lean protein intake, planning for success, and keeping a strict  food journal.  [name] has agreed to follow-up with our clinic in 4 weeks.   No orders of the defined types were placed in this encounter.   There are no discontinued medications.   No orders of the defined types were placed in this encounter.     Objective:   VITALS: Per patient if applicable, see vitals. GENERAL: Alert and in no acute distress. CARDIOPULMONARY: No increased WOB. Speaking in clear sentences.  PSYCH: Pleasant and cooperative. Speech normal rate and rhythm. Affect  is appropriate. Insight and judgement are appropriate. Attention is focused, linear, and appropriate.  NEURO: Oriented as arrived to appointment on time with no prompting.   Lab Results  Component Value Date   CREATININE 0.80 05/04/2021   BUN 20 05/04/2021   NA 143 05/04/2021   K 4.6 05/04/2021   CL 106 05/04/2021   CO2 22 05/04/2021   Lab Results  Component Value Date   ALT 51 (H) 05/04/2021   AST 61 (H) 05/04/2021   ALKPHOS 68 05/04/2021   BILITOT 0.4 05/04/2021   Lab Results  Component Value Date   HGBA1C 5.6 05/04/2021   HGBA1C 5.2 12/13/2020   HGBA1C 5.1 10/15/2019   HGBA1C 4.8 04/08/2019   HGBA1C 5.2 09/25/2018   Lab Results  Component Value Date   INSULIN 4.4 12/13/2020   INSULIN 9.5 10/15/2019   INSULIN 3.4 04/08/2019   INSULIN 3.5 09/25/2018   INSULIN 4.3 01/29/2018   Lab Results  Component Value Date   TSH 2.160 09/25/2018   Lab Results  Component Value Date   CHOL 110 12/13/2020   HDL 49 12/13/2020   LDLCALC 47 12/13/2020   TRIG 64 12/13/2020   CHOLHDL 2 07/03/2017   Lab Results  Component Value Date   WBC 4.5 05/04/2021   HGB 11.1 05/04/2021   HCT 36.5 05/04/2021   MCV 88 05/04/2021   PLT 158 05/04/2021   Lab Results  Component Value Date   IRON 49 05/04/2021   TIBC 449 05/04/2021   FERRITIN 11 (L) 05/04/2021   Lab Results  Component Value Date   VD25OH 40.6 05/04/2021   VD25OH 37.8 12/13/2020   VD25OH 43.5 10/15/2019    Attestation Statements:   Reviewed by clinician on day of visit: allergies, medications, problem list, medical history, surgical history, family history, social history, and previous encounter notes.  Time spent on visit including pre-visit chart review and post-visit charting and care was 35 minutes.  This time included: -preparing to see the patient (e.g., review of tests) -obtaining and/or reviewing separately obtained history -counseling and educating the patient/family/caregiver -documenting clinical  information in the electronic or other health record

## 2021-06-27 ENCOUNTER — Encounter (INDEPENDENT_AMBULATORY_CARE_PROVIDER_SITE_OTHER): Payer: Self-pay | Admitting: Family Medicine

## 2021-06-27 ENCOUNTER — Telehealth (INDEPENDENT_AMBULATORY_CARE_PROVIDER_SITE_OTHER): Payer: Medicare Other | Admitting: Family Medicine

## 2021-06-27 VITALS — Ht 59.0 in | Wt 140.0 lb

## 2021-06-27 DIAGNOSIS — K9589 Other complications of other bariatric procedure: Secondary | ICD-10-CM | POA: Diagnosis not present

## 2021-06-27 DIAGNOSIS — E669 Obesity, unspecified: Secondary | ICD-10-CM | POA: Diagnosis not present

## 2021-06-27 DIAGNOSIS — Z7985 Long-term (current) use of injectable non-insulin antidiabetic drugs: Secondary | ICD-10-CM

## 2021-06-27 DIAGNOSIS — D508 Other iron deficiency anemias: Secondary | ICD-10-CM

## 2021-06-27 DIAGNOSIS — E66812 Obesity, class 2: Secondary | ICD-10-CM

## 2021-06-27 DIAGNOSIS — E1169 Type 2 diabetes mellitus with other specified complication: Secondary | ICD-10-CM

## 2021-06-27 DIAGNOSIS — Z6828 Body mass index (BMI) 28.0-28.9, adult: Secondary | ICD-10-CM

## 2021-07-11 ENCOUNTER — Other Ambulatory Visit (INDEPENDENT_AMBULATORY_CARE_PROVIDER_SITE_OTHER): Payer: Self-pay | Admitting: Family Medicine

## 2021-07-11 DIAGNOSIS — E1169 Type 2 diabetes mellitus with other specified complication: Secondary | ICD-10-CM

## 2021-07-11 MED ORDER — SEMAGLUTIDE(0.25 OR 0.5MG/DOS) 2 MG/3ML ~~LOC~~ SOPN: 0.5000 mg | PEN_INJECTOR | SUBCUTANEOUS | 0 refills | Status: DC

## 2021-07-11 NOTE — Progress Notes (Signed)
ozempic

## 2021-07-25 NOTE — Progress Notes (Unsigned)
TeleHealth Visit:  This visit was completed with telemedicine (audio/video) technology. [name] has verbally consented to this TeleHealth visit. The patient is located at home, the provider is located at home. The participants in this visit include the listed provider and patient. The visit was conducted today via MyChart video.  OBESITY [name] is here to discuss her progress with her obesity treatment plan along with follow-up of her obesity related diagnoses.   Today's visit was # 40 lbs Starting weight: 186 lbs Starting date: 01/28/2018 Weight reported at last virtual office visit: 140 lbs Today's reported weight: 143 lbs  Total weight loss: 43 lbs Weight change since last visit: +3 lbs  Nutrition Plan: keeping a food journal and adhering to recommended goals of 1200-1300 calories and 70-80 protein.  She has not been journaling over the past several weeks. Hunger is moderately controlled. Cravings are poorly controlled.  Current exercise: none  Interim History: [name] says she is really struggling right now.  She is depressed and unable to adhere well to healthy diet.  She reports she is skipping breakfast and snacking on unhealthy foods/carbs.  She is cognizant of her protein intake and trying to maximize this. She ideally likes to keep her weight between 135 and 140 lbs and gaining weight contributes to her depression. She does admit to having some sodas and lattes.  Typical day of food: Breakfast-latte Lunch -see below [name] out about half the time either lunch or dinner.  She will have half of the meal for lunch and half for dinner.  She used to exercise regularly but has not done this recently  Assessment/Plan:  1. Type II Diabetes HgbA1c is not at goal. CBGs:  Not checking Episodes of hypoglycemia? no Medication(s): Ozempic 0.5 mg weekly.  Denies side effects.  Lab Results  Component Value Date   HGBA1C 5.6 05/04/2021   HGBA1C 5.2 12/13/2020   HGBA1C 5.1  10/15/2019   Lab Results  Component Value Date   MICROALBUR <0.7 07/03/2017   LDLCALC 47 12/13/2020   CREATININE 0.80 05/04/2021    Plan: Increase dose of Ozempic to 1 mg weekly.  2.  Other depression/emotional eating She feels that she is depressed.  She was tearful during our visit today.  Denies suicidal/homicidal ideation.  Some of the things that contribute to her low mood are weight gain/of control with eating habits, some of her husband's habits-especially eating habits, lack of contact with her grandchildren/children, the feeling that no one cares for her. Currently this is poorly controlled. Overall mood is not stable. Denies suicidal/homicidal ideation. Medication(s): Duloxetine 60 mg daily, bupropion 200 mg once daily, alprazolam 0.25 mg daily as needed.  Duloxetine and alprazolam prescribed by PCP.  Bupropion prescribed by [facility].  She saw a counselor many years ago.  [name] lives in [place] and has a PCP Shireen Quan, NP  Plan: Change bupropion to XL and increase dose to 300 mg daily.  Continue Cymbalta and alprazolam per PCP. Discussed the advantages of seeing a therapist at great length and she reports she will contact PCP for a referral.  3. Obesity: Current BMI 28.87 [name] is currently in the action stage of change. As such, her goal is to continue with weight loss efforts.  She has agreed to practicing portion control and making smarter food choices, such as increasing vegetables and decreasing simple carbohydrates.   Exercise goals: Encouraged her to incorporate walking into her daily routine.  Behavioral modification strategies: increasing lean protein intake, decreasing simple  carbohydrates, and decreasing liquid calories.  [name] has agreed to follow-up with our clinic in 3 weeks.   No orders of the defined types were placed in this encounter.   Medications Discontinued During This Encounter  Medication Reason   Semaglutide,0.25 or 0.'5MG'$ /DOS, 2 MG/3ML  SOPN Dose change   buPROPion (WELLBUTRIN SR) 200 MG 12 hr tablet Dose change     Meds ordered this encounter  Medications   Semaglutide, 1 MG/DOSE, 4 MG/3ML SOPN    Sig: Inject 1 mg as directed once a week.    Dispense:  3 mL    Refill:  0    Order Specific Question:   Supervising Provider    Answer:   Dennard Nip D [[id number]]   buPROPion (WELLBUTRIN XL) 300 MG 24 hr tablet    Sig: Take 1 tablet (300 mg total) by mouth daily.    Dispense:  90 tablet    Refill:  0    Order Specific Question:   Supervising Provider    Answer:   Dennard Nip D [[id number]]      Objective:   VITALS: Per patient if applicable, see vitals. GENERAL: Alert and in no acute distress. CARDIOPULMONARY: No increased WOB. Speaking in clear sentences.  PSYCH: Pleasant and cooperative. Speech normal rate and rhythm. Affect is appropriate. Insight and judgement are appropriate. Attention is focused, linear, and appropriate.  NEURO: Oriented as arrived to appointment on time with no prompting.   Lab Results  Component Value Date   CREATININE 0.80 05/04/2021   BUN 20 05/04/2021   NA 143 05/04/2021   K 4.6 05/04/2021   CL 106 05/04/2021   CO2 22 05/04/2021   Lab Results  Component Value Date   ALT 51 (H) 05/04/2021   AST 61 (H) 05/04/2021   ALKPHOS 68 05/04/2021   BILITOT 0.4 05/04/2021   Lab Results  Component Value Date   HGBA1C 5.6 05/04/2021   HGBA1C 5.2 12/13/2020   HGBA1C 5.1 10/15/2019   HGBA1C 4.8 04/08/2019   HGBA1C 5.2 09/25/2018   Lab Results  Component Value Date   INSULIN 4.4 12/13/2020   INSULIN 9.5 10/15/2019   INSULIN 3.4 04/08/2019   INSULIN 3.5 09/25/2018   INSULIN 4.3 01/29/2018   Lab Results  Component Value Date   TSH 2.160 09/25/2018   Lab Results  Component Value Date   CHOL 110 12/13/2020   HDL 49 12/13/2020   LDLCALC 47 12/13/2020   TRIG 64 12/13/2020   CHOLHDL 2 07/03/2017   Lab Results  Component Value Date   WBC 4.5 05/04/2021   HGB 11.1 05/04/2021    HCT 36.5 05/04/2021   MCV 88 05/04/2021   PLT 158 05/04/2021   Lab Results  Component Value Date   IRON 49 05/04/2021   TIBC 449 05/04/2021   FERRITIN 11 (L) 05/04/2021   Lab Results  Component Value Date   VD25OH 40.6 05/04/2021   VD25OH 37.8 12/13/2020   VD25OH 43.5 10/15/2019    Attestation Statements:   Reviewed by clinician on day of visit: allergies, medications, problem list, medical history, surgical history, family history, social history, and previous encounter notes.

## 2021-07-26 ENCOUNTER — Telehealth (INDEPENDENT_AMBULATORY_CARE_PROVIDER_SITE_OTHER): Payer: Medicare Other | Admitting: Family Medicine

## 2021-07-26 ENCOUNTER — Encounter (INDEPENDENT_AMBULATORY_CARE_PROVIDER_SITE_OTHER): Payer: Self-pay | Admitting: Family Medicine

## 2021-07-26 VITALS — Ht 59.0 in | Wt 143.0 lb

## 2021-07-26 DIAGNOSIS — Z6828 Body mass index (BMI) 28.0-28.9, adult: Secondary | ICD-10-CM

## 2021-07-26 DIAGNOSIS — F3289 Other specified depressive episodes: Secondary | ICD-10-CM

## 2021-07-26 DIAGNOSIS — E1169 Type 2 diabetes mellitus with other specified complication: Secondary | ICD-10-CM

## 2021-07-26 DIAGNOSIS — E669 Obesity, unspecified: Secondary | ICD-10-CM

## 2021-07-26 DIAGNOSIS — Z7985 Long-term (current) use of injectable non-insulin antidiabetic drugs: Secondary | ICD-10-CM

## 2021-07-26 MED ORDER — BUPROPION HCL ER (XL) 300 MG PO TB24: 300.0000 mg | ORAL_TABLET | Freq: Every day | ORAL | 0 refills | Status: DC

## 2021-07-26 MED ORDER — SEMAGLUTIDE (1 MG/DOSE) 4 MG/3ML ~~LOC~~ SOPN: 1.0000 mg | PEN_INJECTOR | SUBCUTANEOUS | 0 refills | Status: DC

## 2021-07-31 ENCOUNTER — Telehealth (INDEPENDENT_AMBULATORY_CARE_PROVIDER_SITE_OTHER): Payer: Self-pay

## 2021-07-31 NOTE — Telephone Encounter (Signed)
Message from [employer]: [place], Joneen Boers, FNP  My [place], Lazarus Gowda, CMA Hi  Is there a way to send today's note for Melvenia Needles to her PCP?  PCP is in [place] and not on epic.   Rosalyn Gess, NP   Thanks,  Dawn   Routed per [facility] last office visit note.  Routed last office visit note to [facility],  [phone].  Fax #  [phone]

## 2021-08-15 NOTE — Progress Notes (Signed)
TeleHealth Visit:  This visit was completed with telemedicine (audio/video) technology. [name] has verbally consented to this TeleHealth visit. The patient is located at home, the provider is located at home. The participants in this visit include the listed provider and patient. The visit was conducted today via MyChart video.  OBESITY [name] is here to discuss her progress with her obesity treatment plan along with follow-up of her obesity related diagnoses.   Today's visit was # 41 lbs Starting weight: 186 lbs Starting date: 01/28/2018 Weight reported at last virtual office visit: 143 lbs on 07/26/21 Today's reported weight: 142 lbs  Total weight loss: 44 lbs Weight change since last visit: -1 lbs   Nutrition Plan: practicing portion control and making smarter food choices, such as increasing vegetables and decreasing simple carbohydrates.  Hunger is well controlled.  Current exercise: Part-time job at [employer] 2 days per week. Busy at [employer].  Interim History: [name] is doing much better now.  She reports depression has improved.  She now knows this was likely related to hypotension. She is down 1 pound since last office visit but she feels she should be losing more quickly.  She is always very hard on herself. 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  She also stays busy with church activities. She is doing much better with protein and gets from 60 to 85 g/day. Has 1 can or less of Coke per day which is an improvement.  Assessment/Plan:  1. Hypotension due to hypovolemia Saw PCP for depression and systolic blood pressure was in the 80s.  She takes propranolol for essential tremor.  Her water intake was very poor.  She was also having headaches.  She has since increased her water intake and is doing much better.  She is drinking about 35 to 40 ounces per day.  Plan: Increase water intake to 48 ounces per day.   2. Eating  disorder/emotional eating Stress eating is well controlled.  Depression has improved since hydration status has improved.  She does have a lot of anxiety and I think she would benefit from seeing a therapist.  She is under the impression that Medicare does not cover therapy.  Medication(s): Bupropion XL 300 mg daily, Cymbalta 60 mg daily.  Plan: She will check to see if therapy is covered by her insurance plan.  3. Type II Diabetes HgbA1c is at goal. Last A1c was 5.6 CBGs: Not checking Episodes of hypoglycemia: no Medication(s): Ozempic 1 mg weekly.  Lab Results  Component Value Date   HGBA1C 5.6 05/04/2021   HGBA1C 5.2 12/13/2020   HGBA1C 5.1 10/15/2019   Lab Results  Component Value Date   MICROALBUR <0.7 07/03/2017   LDLCALC 47 12/13/2020   CREATININE 0.80 05/04/2021    Plan: Refill Ozempic 1 mg weekly.  4. Obesity: Current BMI 28.67 [name] is currently in the action stage of change. As such, her goal is to continue with weight loss efforts.  She has agreed to practicing portion control and making smarter food choices, such as increasing vegetables and decreasing simple carbohydrates.   Exercise goals: as is  Behavioral modification strategies: increasing lean protein intake and decreasing liquid calories.  [name] has agreed to follow-up with our clinic in 4 weeks.   No orders of the defined types were placed in this encounter.   Medications Discontinued During This Encounter  Medication Reason   Semaglutide, 1 MG/DOSE, 4 MG/3ML SOPN Reorder  Meds ordered this encounter  Medications   Semaglutide, 1 MG/DOSE, 4 MG/3ML SOPN    Sig: Inject 1 mg as directed once a week.    Dispense:  3 mL    Refill:  0    Order Specific Question:   Supervising Provider    Answer:   Quillian Quince D [[id number]]      Objective:   VITALS: Per patient if applicable, see vitals. GENERAL: Alert and in no acute distress. CARDIOPULMONARY: No increased WOB. Speaking in clear sentences.   PSYCH: Pleasant and cooperative. Speech normal rate and rhythm. Affect is appropriate. Insight and judgement are appropriate. Attention is focused, linear, and appropriate.  NEURO: Oriented as arrived to appointment on time with no prompting.   Lab Results  Component Value Date   CREATININE 0.80 05/04/2021   BUN 20 05/04/2021   NA 143 05/04/2021   K 4.6 05/04/2021   CL 106 05/04/2021   CO2 22 05/04/2021   Lab Results  Component Value Date   ALT 51 (H) 05/04/2021   AST 61 (H) 05/04/2021   ALKPHOS 68 05/04/2021   BILITOT 0.4 05/04/2021   Lab Results  Component Value Date   HGBA1C 5.6 05/04/2021   HGBA1C 5.2 12/13/2020   HGBA1C 5.1 10/15/2019   HGBA1C 4.8 04/08/2019   HGBA1C 5.2 09/25/2018   Lab Results  Component Value Date   INSULIN 4.4 12/13/2020   INSULIN 9.5 10/15/2019   INSULIN 3.4 04/08/2019   INSULIN 3.5 09/25/2018   INSULIN 4.3 01/29/2018   Lab Results  Component Value Date   TSH 2.160 09/25/2018   Lab Results  Component Value Date   CHOL 110 12/13/2020   HDL 49 12/13/2020   LDLCALC 47 12/13/2020   TRIG 64 12/13/2020   CHOLHDL 2 07/03/2017   Lab Results  Component Value Date   WBC 4.5 05/04/2021   HGB 11.1 05/04/2021   HCT 36.5 05/04/2021   MCV 88 05/04/2021   PLT 158 05/04/2021   Lab Results  Component Value Date   IRON 49 05/04/2021   TIBC 449 05/04/2021   FERRITIN 11 (L) 05/04/2021   Lab Results  Component Value Date   VD25OH 40.6 05/04/2021   VD25OH 37.8 12/13/2020   VD25OH 43.5 10/15/2019    Attestation Statements:   Reviewed by clinician on day of visit: allergies, medications, problem list, medical history, surgical history, family history, social history, and previous encounter notes.

## 2021-08-16 ENCOUNTER — Telehealth (INDEPENDENT_AMBULATORY_CARE_PROVIDER_SITE_OTHER): Payer: Medicare Other | Admitting: Family Medicine

## 2021-08-16 ENCOUNTER — Encounter (INDEPENDENT_AMBULATORY_CARE_PROVIDER_SITE_OTHER): Payer: Self-pay | Admitting: Family Medicine

## 2021-08-16 VITALS — Ht 59.0 in | Wt 142.0 lb

## 2021-08-16 DIAGNOSIS — E1169 Type 2 diabetes mellitus with other specified complication: Secondary | ICD-10-CM

## 2021-08-16 DIAGNOSIS — Z7985 Long-term (current) use of injectable non-insulin antidiabetic drugs: Secondary | ICD-10-CM

## 2021-08-16 DIAGNOSIS — Z6828 Body mass index (BMI) 28.0-28.9, adult: Secondary | ICD-10-CM

## 2021-08-16 DIAGNOSIS — F3289 Other specified depressive episodes: Secondary | ICD-10-CM | POA: Diagnosis not present

## 2021-08-16 DIAGNOSIS — I9589 Other hypotension: Secondary | ICD-10-CM

## 2021-08-16 DIAGNOSIS — E669 Obesity, unspecified: Secondary | ICD-10-CM

## 2021-08-16 DIAGNOSIS — E861 Hypovolemia: Secondary | ICD-10-CM

## 2021-08-16 MED ORDER — SEMAGLUTIDE (1 MG/DOSE) 4 MG/3ML ~~LOC~~ SOPN: 1.0000 mg | PEN_INJECTOR | SUBCUTANEOUS | 0 refills | Status: DC

## 2021-08-30 ENCOUNTER — Encounter (INDEPENDENT_AMBULATORY_CARE_PROVIDER_SITE_OTHER): Payer: Self-pay

## 2021-09-13 ENCOUNTER — Telehealth (INDEPENDENT_AMBULATORY_CARE_PROVIDER_SITE_OTHER): Payer: Medicare Other | Admitting: Family Medicine

## 2021-09-21 NOTE — Progress Notes (Signed)
TeleHealth Visit:  This visit was completed with telemedicine (audio/video) technology. [name] has verbally consented to this TeleHealth visit. The patient is located at home, the provider is located at home. The participants in this visit include the listed provider and patient. The visit was conducted today via MyChart video.  OBESITY [name] is here to discuss her progress with her obesity treatment plan along with follow-up of her obesity related diagnoses.   Today's visit was # 46  Starting weight: 186 lbs Starting date: 01/28/2018 Weight reported at last virtual office visit: 142 lbs on 08/16/21 Today's reported weight: 143 lbs  Total weight loss: 43 Weight change since last visit: +1  Nutrition Plan: practicing portion control and making smarter food choices, such as increasing vegetables and decreasing simple carbohydrates.   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  Does a lot of lifting and activity in the course of her days.  Interim History: [name] reports she is doing a good job with protein intake.  She is having some cravings in the evening and would like to go back on metformin. She is doing a good job with water intake-averages 40 to 48 ounces per day.  Unable to do more due to incontinence issues.  Assessment/Plan:  1. Type II Diabetes HgbA1c is at goal. Last A1c was 5.6 on 05/04/2021. Episodes of hypoglycemia: no Medication(s): Ozempic 1 mg weekly.  Tolerating well. Notes hunger/cravings in the evening.  Would like to go back on metformin because she felt this helped.  Lab Results  Component Value Date   HGBA1C 5.6 05/04/2021   HGBA1C 5.2 12/13/2020   HGBA1C 5.1 10/15/2019   Lab Results  Component Value Date   MICROALBUR <0.7 07/03/2017   LDLCALC 47 12/13/2020   CREATININE 0.80 05/04/2021    Plan: Refill Ozempic 1 mg weekly. New prescription-metformin 500 mg daily with lunch.   2.  Other depression with  emotional eating [name] has had issues with stress/emotional eating. Currently this is well controlled. Overall mood is stable. Denies suicidal/homicidal ideation.  She feels she is doing pretty well.  She does worry about her husband who has a mobility issues and has poor eating habits (he is also a patient in our clinic). Medication(s): Bupropion XL 300 mg daily, Cymbalta 60 mg daily.  Xanax 0.25 mg once daily as needed.  I am unsure if she is currently taking this.  Plan: Continue all medications at current doses. We discussed starting counseling in the past but she declines at this time.  3. Vitamin D Deficiency Vitamin D is not at goal of 50.  Vitamin D level was 40.6 on 05/04/2021.  She has chronically low vitamin D partially related to history of duodenal switch in 2003.  It takes 50,000 units twice weekly to maintain vitamin D in the 40s. She is on twice weekly weekly prescription Vitamin D 50,000 IU.  Lab Results  Component Value Date   VD25OH 40.6 05/04/2021   VD25OH 37.8 12/13/2020   VD25OH 43.5 10/15/2019    Plan: Refill prescription vitamin D 50,000 IU twice weekly.  4. Obesity: Current BMI 28.87 [name] is currently in the action stage of change. As such, her goal is to continue with weight loss efforts.  She has agreed to practicing portion control and making smarter food choices, such as increasing vegetables and decreasing simple carbohydrates.   Exercise goals: Continue current activity level.  Behavioral modification strategies: increasing lean protein intake, decreasing simple carbohydrates, and  planning for success.  [name] has agreed to follow-up with our clinic in 4 weeks.   No orders of the defined types were placed in this encounter.   Medications Discontinued During This Encounter  Medication Reason   Vitamin D, Ergocalciferol, (DRISDOL) 1.25 MG (50000 UNIT) CAPS capsule Reorder   Semaglutide, 1 MG/DOSE, 4 MG/3ML SOPN Reorder     Meds ordered this encounter   Medications   metFORMIN (GLUCOPHAGE) 500 MG tablet    Sig: Take 1 tablet (500 mg total) by mouth daily with lunch.    Dispense:  90 tablet    Refill:  0    Order Specific Question:   Supervising Provider    Answer:   Dennard Nip D [[id number]]   Semaglutide, 1 MG/DOSE, 4 MG/3ML SOPN    Sig: Inject 1 mg as directed once a week.    Dispense:  9 mL    Refill:  0    Order Specific Question:   Supervising Provider    Answer:   Dennard Nip D [[id number]]   Vitamin D, Ergocalciferol, (DRISDOL) 1.25 MG (50000 UNIT) CAPS capsule    Sig: Take 1 capsule (50,000 Units total) by mouth every 3 (three) days.    Dispense:  30 capsule    Refill:  0    Order Specific Question:   Supervising Provider    Answer:   Dennard Nip D [[id number]]      Objective:   VITALS: Per patient if applicable, see vitals. GENERAL: Alert and in no acute distress. CARDIOPULMONARY: No increased WOB. Speaking in clear sentences.  PSYCH: Pleasant and cooperative. Speech normal rate and rhythm. Affect is appropriate. Insight and judgement are appropriate. Attention is focused, linear, and appropriate.  NEURO: Oriented as arrived to appointment on time with no prompting.   Lab Results  Component Value Date   CREATININE 0.80 05/04/2021   BUN 20 05/04/2021   NA 143 05/04/2021   K 4.6 05/04/2021   CL 106 05/04/2021   CO2 22 05/04/2021   Lab Results  Component Value Date   ALT 51 (H) 05/04/2021   AST 61 (H) 05/04/2021   ALKPHOS 68 05/04/2021   BILITOT 0.4 05/04/2021   Lab Results  Component Value Date   HGBA1C 5.6 05/04/2021   HGBA1C 5.2 12/13/2020   HGBA1C 5.1 10/15/2019   HGBA1C 4.8 04/08/2019   HGBA1C 5.2 09/25/2018   Lab Results  Component Value Date   INSULIN 4.4 12/13/2020   INSULIN 9.5 10/15/2019   INSULIN 3.4 04/08/2019   INSULIN 3.5 09/25/2018   INSULIN 4.3 01/29/2018   Lab Results  Component Value Date   TSH 2.160 09/25/2018   Lab Results  Component Value Date   CHOL 110 12/13/2020   HDL  49 12/13/2020   LDLCALC 47 12/13/2020   TRIG 64 12/13/2020   CHOLHDL 2 07/03/2017   Lab Results  Component Value Date   WBC 4.5 05/04/2021   HGB 11.1 05/04/2021   HCT 36.5 05/04/2021   MCV 88 05/04/2021   PLT 158 05/04/2021   Lab Results  Component Value Date   IRON 49 05/04/2021   TIBC 449 05/04/2021   FERRITIN 11 (L) 05/04/2021   Lab Results  Component Value Date   VD25OH 40.6 05/04/2021   VD25OH 37.8 12/13/2020   VD25OH 43.5 10/15/2019    Attestation Statements:   Reviewed by clinician on day of visit: allergies, medications, problem list, medical history, surgical history, family history, social history, and previous encounter notes.

## 2021-09-26 ENCOUNTER — Telehealth (INDEPENDENT_AMBULATORY_CARE_PROVIDER_SITE_OTHER): Payer: Medicare Other | Admitting: Family Medicine

## 2021-09-26 ENCOUNTER — Encounter (INDEPENDENT_AMBULATORY_CARE_PROVIDER_SITE_OTHER): Payer: Self-pay | Admitting: Family Medicine

## 2021-09-26 ENCOUNTER — Other Ambulatory Visit (INDEPENDENT_AMBULATORY_CARE_PROVIDER_SITE_OTHER): Payer: Self-pay | Admitting: Family Medicine

## 2021-09-26 VITALS — Ht 59.0 in | Wt 143.0 lb

## 2021-09-26 DIAGNOSIS — E559 Vitamin D deficiency, unspecified: Secondary | ICD-10-CM

## 2021-09-26 DIAGNOSIS — F3289 Other specified depressive episodes: Secondary | ICD-10-CM

## 2021-09-26 DIAGNOSIS — E1169 Type 2 diabetes mellitus with other specified complication: Secondary | ICD-10-CM

## 2021-09-26 DIAGNOSIS — E669 Obesity, unspecified: Secondary | ICD-10-CM | POA: Diagnosis not present

## 2021-09-26 DIAGNOSIS — Z7985 Long-term (current) use of injectable non-insulin antidiabetic drugs: Secondary | ICD-10-CM

## 2021-09-26 DIAGNOSIS — Z6828 Body mass index (BMI) 28.0-28.9, adult: Secondary | ICD-10-CM

## 2021-09-26 MED ORDER — VITAMIN D (ERGOCALCIFEROL) 1.25 MG (50000 UNIT) PO CAPS: 50000.0000 [IU] | ORAL_CAPSULE | ORAL | 0 refills | Status: DC

## 2021-09-26 MED ORDER — SEMAGLUTIDE (1 MG/DOSE) 4 MG/3ML ~~LOC~~ SOPN: 1.0000 mg | PEN_INJECTOR | SUBCUTANEOUS | 0 refills | Status: DC

## 2021-09-26 MED ORDER — METFORMIN HCL 500 MG PO TABS: 500.0000 mg | ORAL_TABLET | Freq: Every day | ORAL | 0 refills | Status: DC

## 2021-10-30 NOTE — Progress Notes (Unsigned)
TeleHealth Visit:  This visit was completed with telemedicine (audio/video) technology. [name] has verbally consented to this TeleHealth visit. The patient is located at home, the provider is located at home. The participants in this visit include the listed provider and patient. The visit was conducted today via MyChart video.  OBESITY [name] is here to discuss her progress with her obesity treatment plan along with follow-up of her obesity related diagnoses.    Today's visit was # 78  Starting weight: 186 lbs Starting date: 01/28/2018 Weight reported at last virtual office visit: 143 lbs on 08/26/21 Today's reported weight: 143 lbs  Total weight loss: 43 Weight change since last visit: 0  Nutrition Plan: practicing portion control and making smarter food choices, such as increasing vegetables and decreasing simple carbohydrates.   Current exercise:  working 2-3 days per [employer] active job  Interim History: Weight is stable from last visit but she is concerned about regaining weight.  Her goal is 135-140 pounds and she has been between 140 and 143 since April of this year. She has been off her normal eating routine due to recent trip to [facility].  Has not been eating regular meals or getting in enough protein.  Reports too many carbohydrates. She is very busy between work and church activities.  She does not want to go back to journaling food intake at this time.  Assessment/Plan:  1.  Other depression/emotional eating [name] has had issues with stress/emotional eating. Currently this is poorly controlled. Overall mood is stable- she says much better than it was.. Denies suicidal/homicidal ideation.  She does report a lot of worry-about her husband whose health is poor and she feels he does not take care of himself, her son who had bariatric surgery and has lost too much weight, etc. Medication(s): Bupropion XL 300 mg daily, Cymbalta 60 mg daily.  Xanax 0.25 mg once  daily as needed.  Plan: Continue all medications at current dosages.  2.  Type II Diabetes with other unspecified complications without long-term use of insulin HgbA1c is at goal. Last A1c was 5.6 on 05/04/2021.  Medication(s): Metformin 500 mg at lunch-frequently forgets to take this.  Ozempic 1 mg weekly-husband gives this injection and she also forgets to have him do the injection.  Lab Results  Component Value Date   HGBA1C 5.6 05/04/2021   HGBA1C 5.2 12/13/2020   HGBA1C 5.1 10/15/2019   Lab Results  Component Value Date   MICROALBUR <0.7 07/03/2017   LDLCALC 47 12/13/2020   CREATININE 0.80 05/04/2021    Plan: Discussed setting reminders on her phone to remind her to take the [place]. Continue Ozempic 1 mg weekly. Continue metformin 500 mg daily with a meal-told her she can take this with any meal she would like.  3. Obesity: Current BMI 28.87 [name] is currently in the action stage of change. As such, her goal is to continue with weight loss efforts.  She has agreed to practicing portion control and making smarter food choices, such as increasing vegetables and decreasing simple carbohydrates.   Discussed since restarting journaling if she feels she is gaining weight over the next month.   Calorie goal for the day is 1200 to 1300 cal, protein goal is 80 g of protein. Can use lose it app to journal.  Exercise goals: as is  Behavioral modification strategies: increasing lean protein intake, decreasing simple carbohydrates, no skipping meals, and planning for success.  [name] has agreed to follow-up with our clinic in  4 weeks.   No orders of the defined types were placed in this encounter.   There are no discontinued medications.   No orders of the defined types were placed in this encounter.     Objective:   VITALS: Per patient if applicable, see vitals. GENERAL: Alert and in no acute distress. CARDIOPULMONARY: No increased WOB. Speaking in clear sentences.   PSYCH: Pleasant and cooperative. Speech normal rate and rhythm. Affect is appropriate. Insight and judgement are appropriate. Attention is focused, linear, and appropriate.  NEURO: Oriented as arrived to appointment on time with no prompting.   Lab Results  Component Value Date   CREATININE 0.80 05/04/2021   BUN 20 05/04/2021   NA 143 05/04/2021   K 4.6 05/04/2021   CL 106 05/04/2021   CO2 22 05/04/2021   Lab Results  Component Value Date   ALT 51 (H) 05/04/2021   AST 61 (H) 05/04/2021   ALKPHOS 68 05/04/2021   BILITOT 0.4 05/04/2021   Lab Results  Component Value Date   HGBA1C 5.6 05/04/2021   HGBA1C 5.2 12/13/2020   HGBA1C 5.1 10/15/2019   HGBA1C 4.8 04/08/2019   HGBA1C 5.2 09/25/2018   Lab Results  Component Value Date   INSULIN 4.4 12/13/2020   INSULIN 9.5 10/15/2019   INSULIN 3.4 04/08/2019   INSULIN 3.5 09/25/2018   INSULIN 4.3 01/29/2018   Lab Results  Component Value Date   TSH 2.160 09/25/2018   Lab Results  Component Value Date   CHOL 110 12/13/2020   HDL 49 12/13/2020   LDLCALC 47 12/13/2020   TRIG 64 12/13/2020   CHOLHDL 2 07/03/2017   Lab Results  Component Value Date   WBC 4.5 05/04/2021   HGB 11.1 05/04/2021   HCT 36.5 05/04/2021   MCV 88 05/04/2021   PLT 158 05/04/2021   Lab Results  Component Value Date   IRON 49 05/04/2021   TIBC 449 05/04/2021   FERRITIN 11 (L) 05/04/2021   Lab Results  Component Value Date   VD25OH 40.6 05/04/2021   VD25OH 37.8 12/13/2020   VD25OH 43.5 10/15/2019    Attestation Statements:   Reviewed by clinician on day of visit: allergies, medications, problem list, medical history, surgical history, family history, social history, and previous encounter notes.  Time spent on visit including the items listed below was 30 minutes.  -preparing to see the patient (e.g., review of tests, history, previous notes) -obtaining and/or reviewing separately obtained history -counseling and educating the  patient/family/caregiver -documenting clinical information in the electronic or other health record

## 2021-10-31 ENCOUNTER — Encounter (INDEPENDENT_AMBULATORY_CARE_PROVIDER_SITE_OTHER): Payer: Self-pay | Admitting: Family Medicine

## 2021-10-31 ENCOUNTER — Telehealth (INDEPENDENT_AMBULATORY_CARE_PROVIDER_SITE_OTHER): Payer: Medicare Other | Admitting: Family Medicine

## 2021-10-31 VITALS — Ht 59.0 in | Wt 143.0 lb

## 2021-10-31 DIAGNOSIS — F3289 Other specified depressive episodes: Secondary | ICD-10-CM | POA: Diagnosis not present

## 2021-10-31 DIAGNOSIS — E669 Obesity, unspecified: Secondary | ICD-10-CM

## 2021-10-31 DIAGNOSIS — E1169 Type 2 diabetes mellitus with other specified complication: Secondary | ICD-10-CM

## 2021-10-31 DIAGNOSIS — Z7985 Long-term (current) use of injectable non-insulin antidiabetic drugs: Secondary | ICD-10-CM

## 2021-10-31 DIAGNOSIS — Z7984 Long term (current) use of oral hypoglycemic drugs: Secondary | ICD-10-CM

## 2021-10-31 DIAGNOSIS — Z6828 Body mass index (BMI) 28.0-28.9, adult: Secondary | ICD-10-CM

## 2021-11-28 NOTE — Progress Notes (Signed)
TeleHealth Visit:  This visit was completed with telemedicine (audio/video) technology. [name] has verbally consented to this TeleHealth visit. The patient is located at home, the provider is located at home. The participants in this visit include the listed provider and patient. The visit was conducted today via MyChart video.  OBESITY [name] is here to discuss her progress with her obesity treatment plan along with follow-up of her obesity related diagnoses.    Today's visit was # 86  Starting weight: 186 lbs Starting date: 01/28/2018 Weight reported at last virtual office visit: 143 lbs on 10/31/21 Today's reported weight: 146 lbs  Total weight loss: 40 lbs Weight change since last visit: +3   Nutrition Plan: practicing portion control and making smarter food choices, such as increasing vegetables and decreasing simple carbohydrates.    Current exercise:  working 3-4 days per [employer] active job  Interim History: [name] has had a great deal of stress and has been doing some stress eating.  She has been sick with a tooth abscess and has had a chronic cough for several months.  She does not cook much and they sometimes pick up food in the evenings.  She does prioritize getting in her protein.  She says she needs to drink more water. Her goal weight is 135-140 pounds.  Assessment/Plan:  1. Type II Diabetes HgbA1c is at goal. Last A1c was 5.6 on 05/04/2021. Medication(s): Metformin 500 mg daily with meal, Ozempic 1 mg weekly.  Her PCP wants to discontinue the metformin in an attempt to streamline her med list. Appetite well controlled.  Lab Results  Component Value Date   HGBA1C 5.6 05/04/2021   HGBA1C 5.2 12/13/2020   HGBA1C 5.1 10/15/2019   Lab Results  Component Value Date   MICROALBUR <0.7 07/03/2017   LDLCALC 47 12/13/2020   CREATININE 0.80 05/04/2021    Plan: Advised that I feel that discontinuing metformin would be fine. Refill Ozempic 1 mg weekly. She had labs  done at PCP yesterday.  Will send for lab results.   2. Hyperlipidemia associated with type 2 diabetes LDL is at goal.  Lipid profile from 12/13/2020-LDL at goal at 47, HDL slightly low at 49, triglycerides normal at 64. Medication(s): Crestor 20 mg daily.  Denies myalgias. Cardiovascular risk factors: advanced age (older than 91 for men, 10 for women), diabetes mellitus, dyslipidemia, and sedentary lifestyle  Lab Results  Component Value Date   CHOL 110 12/13/2020   HDL 49 12/13/2020   LDLCALC 47 12/13/2020   TRIG 64 12/13/2020   CHOLHDL 2 07/03/2017   Lab Results  Component Value Date   ALT 51 (H) 05/04/2021   AST 61 (H) 05/04/2021   ALKPHOS 68 05/04/2021   BILITOT 0.4 05/04/2021   The ASCVD Risk score (Arnett DK, et al., 2019) failed to calculate for the following reasons:   The valid total cholesterol range is 130 to 320 mg/dL  Plan: Refill Crestor 20 mg daily.  3. Obesity: Current BMI 29.5 [name] is currently in the action stage of change. As such, her goal is to continue with weight loss efforts.  She has agreed to practicing portion control and making smarter food choices, such as increasing vegetables and decreasing simple carbohydrates  Or  following a lower carbohydrate, vegetable and lean protein rich diet plan.   Low-carb plan sent via MyChart.  Exercise goals: Encouraged more walking.  Behavioral modification strategies: increasing lean protein intake, decreasing simple carbohydrates, and emotional eating strategies.  [name] has agreed to follow-up  with our clinic in 4 weeks.   No orders of the defined types were placed in this encounter.   Medications Discontinued During This Encounter  Medication Reason   rosuvastatin (CRESTOR) 20 MG tablet Reorder   Semaglutide, 1 MG/DOSE, 4 MG/3ML SOPN Reorder     Meds ordered this encounter  Medications   rosuvastatin (CRESTOR) 20 MG tablet    Sig: Take 1 tablet (20 mg total) by mouth daily.    Dispense:  90  tablet    Refill:  1    Order Specific Question:   Supervising Provider    Answer:   Loyal Gambler E [[id number]]   Semaglutide, 1 MG/DOSE, 4 MG/3ML SOPN    Sig: Inject 1 mg as directed once a week.    Dispense:  3 mL    Refill:  0    Order Specific Question:   Supervising Provider    Answer:   Dell Ponto [[id number]]      Objective:   VITALS: Per patient if applicable, see vitals. GENERAL: Alert and in no acute distress. CARDIOPULMONARY: No increased WOB. Speaking in clear sentences.  PSYCH: Pleasant and cooperative. Speech normal rate and rhythm. Affect is appropriate. Insight and judgement are appropriate. Attention is focused, linear, and appropriate.  NEURO: Oriented as arrived to appointment on time with no prompting.   Lab Results  Component Value Date   CREATININE 0.80 05/04/2021   BUN 20 05/04/2021   NA 143 05/04/2021   K 4.6 05/04/2021   CL 106 05/04/2021   CO2 22 05/04/2021   Lab Results  Component Value Date   ALT 51 (H) 05/04/2021   AST 61 (H) 05/04/2021   ALKPHOS 68 05/04/2021   BILITOT 0.4 05/04/2021   Lab Results  Component Value Date   HGBA1C 5.6 05/04/2021   HGBA1C 5.2 12/13/2020   HGBA1C 5.1 10/15/2019   HGBA1C 4.8 04/08/2019   HGBA1C 5.2 09/25/2018   Lab Results  Component Value Date   INSULIN 4.4 12/13/2020   INSULIN 9.5 10/15/2019   INSULIN 3.4 04/08/2019   INSULIN 3.5 09/25/2018   INSULIN 4.3 01/29/2018   Lab Results  Component Value Date   TSH 2.160 09/25/2018   Lab Results  Component Value Date   CHOL 110 12/13/2020   HDL 49 12/13/2020   LDLCALC 47 12/13/2020   TRIG 64 12/13/2020   CHOLHDL 2 07/03/2017   Lab Results  Component Value Date   WBC 4.5 05/04/2021   HGB 11.1 05/04/2021   HCT 36.5 05/04/2021   MCV 88 05/04/2021   PLT 158 05/04/2021   Lab Results  Component Value Date   IRON 49 05/04/2021   TIBC 449 05/04/2021   FERRITIN 11 (L) 05/04/2021   Lab Results  Component Value Date   VD25OH 40.6 05/04/2021   VD25OH 37.8  12/13/2020   VD25OH 43.5 10/15/2019    Attestation Statements:   Reviewed by clinician on day of visit: allergies, medications, problem list, medical history, surgical history, family history, social history, and previous encounter notes.

## 2021-11-29 ENCOUNTER — Encounter (INDEPENDENT_AMBULATORY_CARE_PROVIDER_SITE_OTHER): Payer: Self-pay | Admitting: Family Medicine

## 2021-11-29 ENCOUNTER — Telehealth (INDEPENDENT_AMBULATORY_CARE_PROVIDER_SITE_OTHER): Payer: Medicare Other | Admitting: Family Medicine

## 2021-11-29 VITALS — Ht 59.0 in | Wt 146.0 lb

## 2021-11-29 DIAGNOSIS — E669 Obesity, unspecified: Secondary | ICD-10-CM

## 2021-11-29 DIAGNOSIS — Z7984 Long term (current) use of oral hypoglycemic drugs: Secondary | ICD-10-CM

## 2021-11-29 DIAGNOSIS — Z6829 Body mass index (BMI) 29.0-29.9, adult: Secondary | ICD-10-CM | POA: Diagnosis not present

## 2021-11-29 DIAGNOSIS — E1169 Type 2 diabetes mellitus with other specified complication: Secondary | ICD-10-CM | POA: Diagnosis not present

## 2021-11-29 DIAGNOSIS — Z7985 Long-term (current) use of injectable non-insulin antidiabetic drugs: Secondary | ICD-10-CM

## 2021-11-29 DIAGNOSIS — E785 Hyperlipidemia, unspecified: Secondary | ICD-10-CM | POA: Diagnosis not present

## 2021-11-29 MED ORDER — ROSUVASTATIN CALCIUM 20 MG PO TABS: 20.0000 mg | ORAL_TABLET | Freq: Every day | ORAL | 1 refills | Status: DC

## 2021-11-29 MED ORDER — SEMAGLUTIDE (1 MG/DOSE) 4 MG/3ML ~~LOC~~ SOPN: 1.0000 mg | PEN_INJECTOR | SUBCUTANEOUS | 0 refills | Status: DC

## 2021-12-12 ENCOUNTER — Encounter: Payer: Self-pay | Admitting: Cardiology

## 2021-12-12 ENCOUNTER — Ambulatory Visit: Payer: Medicare Other | Admitting: Cardiology

## 2021-12-12 VITALS — BP 129/60 | HR 52 | Ht 59.0 in | Wt 145.0 lb

## 2021-12-12 DIAGNOSIS — I6523 Occlusion and stenosis of bilateral carotid arteries: Secondary | ICD-10-CM

## 2021-12-12 DIAGNOSIS — I350 Nonrheumatic aortic (valve) stenosis: Secondary | ICD-10-CM

## 2021-12-12 DIAGNOSIS — E7800 Pure hypercholesterolemia, unspecified: Secondary | ICD-10-CM

## 2021-12-12 NOTE — Progress Notes (Unsigned)
Primary Physician/Referring:  No primary care provider on file.  Patient ID: [name], female    DOB: Oct 19, 1945, 76 y.o.   MRN: [id number]  Chief Complaint  Patient presents with   ilateral carotid artery stenosis    HPI:    HPI: [name]  is a 76 y.o. with history of obstructive sleep apnea on CPAP, prior morbid obesity and has maintained weight loss, prior history of gastric bypass in 2003, GERD, hyperlipidemia, chronic back pain, mild asymptomatic aortic stenosis and asymptomatic bilateral carotid stenosis.  Patient had transferred her care to [facility], [place] however wanted to reestablish with me. No new symptoms. Has maintained weight loss.  States that she continues to remain active, no chest pain or dyspnea or leg edema. BP is well controlled.  She is worried about her husband.   Past Medical History:  Diagnosis Date   Allergy    rhinitis   Anemia 02-01-12   iron absorption problem   Arthritis 02-01-12   osteoarthritis, Back pain, spine surgeries ? retain hardware cervial and  lumbar.   Asthma    Back pain    Cancer ([place]) 02-01-12   squamous cell -face   Depression with anxiety 01/08/2011   Diabetes ([place])    Dyspnea    Esophageal reflux    Fibromyalgia    Gallbladder problem    Generalized headaches    Heart murmur    HLD (hyperlipidemia)    HTN (hypertension)    Hyperparathyroidism ([place]) 08/18/2014   Joint pain    Knee pain, right 09/20/2011   Motion sickness 12/24/2011   Nausea & vomiting 12/24/2011   Nerve pain    Obesity    OSA (obstructive sleep apnea)    Osteoarthritis    Osteopenia 08/05/2014   Panic attacks    Pelvic floor dysfunction 09/02/2016   Sleep apnea, obstructive    Stress incontinence    Varicose veins of lower limb 10/02/2011   Vasovagal syncope 11/22/2017   Vitamin D deficiency    Past Surgical History:  Procedure Laterality Date   ABDOMINAL SURGERY  02-01-12   ABDOMINAL SURGERY   APPENDECTOMY  1962   BARIATRIC SURGERY      BLADDER SUSPENSION     complicated by bowel nick/ clolostomy   BLEPHAROPLASTY Bilateral    CARPAL TUNNEL RELEASE  02-01-12   right   CATARACT EXTRACTION Bilateral    CERVICAL DISC SURGERY     C-7 in 2004, C-4 and C-5 in 2010   colostomy bag     Confirm with patient. Listed under medical conditions on form dated 07/18/09.   COLOSTOMY REVERSAL  02-01-12   EYE SURGERY     b/l cataracts and b/l blepharplasty   [place]   GASTRIC BYPASS  2003   Patient also noted "gastric bypass resection - 2004"   HERNIA REPAIR  2009   Two hernias and abdominal reconstruction   ivc filter     prophyllactically- no hx DVT   LUMBAR DISC SURGERY     L1-L3 all had surgery most recently in 2017   [place]     L3-L4, Dr. Trenton Gammon   pannilectomy     Per medical history form dated 07/18/09.   TOTAL KNEE ARTHROPLASTY  02/11/2012   Procedure: TOTAL KNEE ARTHROPLASTY;  Surgeon: Mauri Pole, MD;  Location: [facility];  Service: Orthopedics;  Laterality: Right;   ULNAR NERVE REPAIR Left 07/18/2016   by [place]  02-01-12   cyst removal-pt 8 months pregnant   Social History   Tobacco Use   Smoking status: Never   Smokeless tobacco: Never  Substance Use Topics   Alcohol use: No    Alcohol/week: 0.0 standard drinks of alcohol    Comment: special occasions  Marital Status: Married    ROS  Review of Systems  Cardiovascular:  Negative for chest pain, dyspnea on exertion and leg swelling.   Objective      12/12/2021    1:07 PM 11/29/2021    1:09 PM 10/31/2021   10:17 AM  Vitals with BMI  Height _0  _1  _2   Weight 145 lbs 146 lbs 143 lbs  BMI 29.27 26.71 24.58  Systolic 099    Diastolic 60    Pulse 52       Physical Exam Vitals reviewed.  Constitutional:      Appearance: She is well-developed.     Comments: Well built and moderately obese  Neck:     Vascular: Carotid bruit (bilateral.  Left very prominent.) present. No JVD.  Cardiovascular:      Rate and Rhythm: Normal rate and regular rhythm.     Pulses: Normal pulses and intact distal pulses.     Heart sounds: Murmur heard.     Early systolic murmur is present with a grade of 3/6 at the upper right sternal border radiating to the neck.  Pulmonary:     Effort: Pulmonary effort is normal. No accessory muscle usage or respiratory distress.     Breath sounds: Normal breath sounds.  Abdominal:     General: Bowel sounds are normal.     Palpations: Abdomen is soft.    Radiology: No results found.  Laboratory examination:   Lab Results  Component Value Date   NA 143 05/04/2021   K 4.6 05/04/2021   CO2 22 05/04/2021   GLUCOSE 78 05/04/2021   BUN 20 05/04/2021   CREATININE 0.80 05/04/2021   CALCIUM 8.0 (L) 05/04/2021   EGFR 77 05/04/2021   GFRNONAA 84 10/15/2019       Latest Ref Rng & Units 05/04/2021   11:25 AM 10/15/2019   11:04 AM 03/10/2019   12:00 AM  CMP  Glucose 70 - 99 mg/dL 78  76    BUN 8 - 27 mg/dL _3 Creatinine 0.57 - 1.00 mg/dL 0.80  0.72  0.8      Sodium 134 - 144 mmol/L 143  140  136      Potassium 3.5 - 5.2 mmol/L 4.6  4.7  4.2      Chloride 96 - 106 mmol/L 106  100  100      CO2 20 - 29 mmol/L _4 Calcium 8.7 - 10.3 mg/dL 8.0  8.7  8.4      Total Protein 6.0 - 8.5 g/dL 5.9  5.6    Total Bilirubin 0.0 - 1.2 mg/dL 0.4  0.5    Alkaline Phos 44 - 121 IU/L 68  106    AST 0 - 40 IU/L 61  51    ALT 0 - 32 IU/L 51  32       This result is from an external source.      Latest Ref Rng & Units 05/04/2021   11:25 AM 12/13/2020   11:00 AM 10/15/2019   11:04 AM  CBC  WBC 3.4 - 10.8 x10E3/uL  4.5  4.8  4.6   Hemoglobin 11.1 - 15.9 g/dL 11.1  12.3  11.8   Hematocrit 34.0 - 46.6 % 36.5  39.1  37.0   Platelets 150 - 450 x10E3/uL 158  232  150    Lab Results  Component Value Date   CHOL 110 12/13/2020   HDL 49 12/13/2020   LDLCALC 47 12/13/2020   TRIG 64 12/13/2020   CHOLHDL 2 07/03/2017    HEMOGLOBIN A1C Lab Results   Component Value Date   HGBA1C 5.6 05/04/2021   MPG 114 02/02/2013   Lab Results  Component Value Date   TSH 2.160 09/25/2018    Medications   Current Outpatient Medications:    ALPRAZolam (XANAX) 0.25 MG tablet, Take 1 tablet (0.25 mg total) by mouth daily as needed. For anxiety, Disp: 30 tablet, Rfl: 3   amoxicillin-clavulanate (AUGMENTIN) 250-125 MG tablet, Take 1 tablet by mouth 3 (three) times daily., Disp: , Rfl:    aspirin 81 MG tablet, Take 81 mg by mouth daily., Disp: , Rfl:    Biotin 10 MG CAPS, biotin 10,000 mcg capsule  Take 1 capsule every day by oral route., Disp: , Rfl:    buPROPion (WELLBUTRIN XL) 300 MG 24 hr tablet, Take 1 tablet (300 mg total) by mouth daily., Disp: 90 tablet, Rfl: 0   denosumab (PROLIA) 60 MG/ML SOSY injection, Inject 60 mg into the skin every 6 (six) months., Disp: , Rfl:    DULoxetine (CYMBALTA) 60 MG capsule, Take 1 capsule by mouth once daily, Disp: 90 capsule, Rfl: 0   fluticasone (FLONASE) 50 MCG/ACT nasal spray, Use 2 spray(s) in each nostril once daily, Disp: 16 g, Rfl: 3   hydrOXYzine (ATARAX/VISTARIL) 10 MG tablet, Take 1-2 tablets (10-20 mg total) by mouth 3 (three) times daily as needed for itching., Disp: 30 tablet, Rfl: 1   levocetirizine (XYZAL) 5 MG tablet, Take 5 mg by mouth daily. , Disp: , Rfl:    methylphenidate (RITALIN) 10 MG tablet, Take 1 tablet (10 mg total) by mouth as needed., Disp: 60 tablet, Rfl: 0   MYRBETRIQ 50 MG TB24 tablet, daily., Disp: , Rfl:    omeprazole (PRILOSEC) 20 MG capsule, Take 20 mg by mouth daily., Disp: , Rfl:    propranolol (INDERAL) 40 MG tablet, Take 1 tablet by mouth once daily, Disp: 90 tablet, Rfl: 0   rosuvastatin (CRESTOR) 20 MG tablet, Take 1 tablet (20 mg total) by mouth daily., Disp: 90 tablet, Rfl: 1   Semaglutide, 1 MG/DOSE, 4 MG/3ML SOPN, Inject 1 mg as directed once a week., Disp: 3 mL, Rfl: 0   traZODone (DESYREL) 100 MG tablet, TAKE 1/2 TO 1 (ONE-HALF TO ONE) TABLET BY MOUTH ONCE DAILY AS  NEEDED FOR SLEEP, Disp: 90 tablet, Rfl: 0   Vitamin D, Ergocalciferol, (DRISDOL) 1.25 MG (50000 UNIT) CAPS capsule, TAKE 1 CAPSULE BY MOUTH EVERY 3 DAYS, Disp: 30 capsule, Rfl: 0    Cardiac Studies:   Lexiscan myoview stress test 01/10/2018:  1. Lexiscan stress test was performed. Exercise capacity was not assessed. Stress symptoms included dyspnea, headache, dizziness. Peak effect blood pressure was 170/80 mmHg. The resting and stress electrocardiogram demonstrated normal sinus rhythm, RBBB, no resting arrhythmias and nonspecific ST-T changes. Stress EKG is non diagnostic for ischemia as it is a pharmacologic stress. 2. The overall quality of the study is fair. Review of the raw data in a rotational cine format reveals breast attenuation, with imaging performed in sitting position. Left ventricular cavity  is noted to be normal on the rest and stress studies. Gated SPECT images reveal normal myocardial thickening and wall motion. The left ventricular ejection fraction was calculated or visually estimated to be 54%. SPECT images demonstrate small sized, mild intensity, perfusion defect in basal inferior myocardium, more prominent on rest images. This likely represent breast attenuation artifact. 3. Low risk study.  Event Monitor 30 days 12/25/2017: Minimum heart rate 44 bpm at 05:57 AM, maximum heart rate 97 bpm. Symptoms of lightheadedness and dizziness revealed normal sinus rhythm/sinus bradycardia. Rare PVCs were evident.  Echocardiogram 06/11/2017: Left ventricle cavity is normal in size. Severe concentric hypertrophy of the left ventricle. Normal global wall motion. Doppler evidence of grade II (pseudonormal) diastolic dysfunction, elevated LAP. Calculated EF 62%. Left atrial cavity is mildly dilated as per AP dimension of 3.9 cm. However, visually looks moderately dilated. Mild aortic valve leaflet calcification. Mild aortic valve stenosis. Aortic valve mean gradient of 14 mmHg, Vmax of 2.7  m/s. Calculated aortic valve area by continuity equation is 1.6 cm. No aortic valve regurgitation noted. Mild to moderate mitral regurgitation. Mild tricuspid regurgitation. Estimated pulmonary artery systolic pressure 28 mmHg. Compared to previous study dated 04/24/2016, there is interval increase in severity of aortic stenosis, mitral and tricuspid regurgitation. Diastolic dysfunction is new. Mild increase in pulmonary systolic pressure.  Carotid artery duplex  12/17/2018: Stenosis in the right internal carotid artery (16-49%). Stenosis in the left internal carotid artery (16-49%). Antegrade right vertebral artery flow. Antegrade left vertebral artery flow. Diffuse heterogeneous plaque noted in bilateral carotid arteries.  Compared to 12/18/2017, right ICA stenosis is new. Follow up in one year is appropriate if clinically indicated.  EKG:  EKG 12/12/2021: Sinus bradycardia at rate of 52 bpm, left axis deviation, left anterior fascicular block.  Right bundle branch block.  Bifascicular block.  Poor R wave progression, low voltage complexes.  Consider pulmonary disease pattern.  Nonspecific T wave abnormality.  Compared to 12/24/2018, no significant change.  Assessment     ICD-10-CM   1. Bilateral carotid artery stenosis  I65.23 EKG 12-Lead    PCV CAROTID DUPLEX (BILATERAL)    2. Pure hypercholesterolemia  E78.00     3. Mild aortic stenosis  I35.0 PCV ECHOCARDIOGRAM COMPLETE       Recommendations:   HPI: [name]  is a 76 y.o. with history of obstructive sleep apnea on CPAP, prior morbid obesity and has maintained weight loss, prior history of gastric bypass in 2003, GERD, hyperlipidemia, chronic back pain, mild asymptomatic aortic stenosis and asymptomatic bilateral carotid stenosis.  1. Bilateral carotid artery stenosis Patient had transferred her care to [facility], [place] however wanted to reestablish with me.  She has not had recent carotid artery duplex.  I tried to reach  out her labs and investigations on care everywhere but unable to.  Carotid artery duplex ordered today.  2. Pure hypercholesterolemia Her latest lipids that was checked in 2022 was well controlled.  She is presently doing well and remains asymptomatic.  Continue present medications.  3. Mild aortic stenosis She needs repeat echocardiogram, aortic stenotic murmur appears to be slightly more prominent no clinical evidence of heart failure.  No dyspnea, no PND or orthopnea.  As she is stable, I will see her back on annual basis unless tests are markedly abnormal.   Adrian Prows, MD, [facility] 12/12/2021, 1:55 PM Office: [phone] Fax: [phone] Pager: [phone]

## 2021-12-19 ENCOUNTER — Ambulatory Visit (HOSPITAL_BASED_OUTPATIENT_CLINIC_OR_DEPARTMENT_OTHER)
Admission: RE | Admit: 2021-12-19 | Discharge: 2021-12-19 | Disposition: A | Discharge status: Home / Self Care | Payer: Medicare Other | Source: Ambulatory Visit

## 2021-12-19 ENCOUNTER — Ambulatory Visit (HOSPITAL_COMMUNITY)
Admission: RE | Admit: 2021-12-19 | Discharge: 2021-12-19 | Disposition: A | Discharge status: Home / Self Care | Payer: Medicare Other | Source: Ambulatory Visit | Attending: Cardiology | Admitting: Cardiology

## 2021-12-19 DIAGNOSIS — I350 Nonrheumatic aortic (valve) stenosis: Secondary | ICD-10-CM

## 2021-12-19 DIAGNOSIS — I6523 Occlusion and stenosis of bilateral carotid arteries: Secondary | ICD-10-CM | POA: Insufficient documentation

## 2021-12-19 DIAGNOSIS — I10 Essential (primary) hypertension: Secondary | ICD-10-CM | POA: Insufficient documentation

## 2021-12-19 DIAGNOSIS — E119 Type 2 diabetes mellitus without complications: Secondary | ICD-10-CM | POA: Diagnosis not present

## 2021-12-19 DIAGNOSIS — E785 Hyperlipidemia, unspecified: Secondary | ICD-10-CM | POA: Insufficient documentation

## 2021-12-19 DIAGNOSIS — R0989 Other specified symptoms and signs involving the circulatory and respiratory systems: Secondary | ICD-10-CM | POA: Diagnosis not present

## 2021-12-19 NOTE — Progress Notes (Signed)
Carotid artery duplex 12/19/2021: Right Carotid: Velocities in the right ICA are consistent with a 1-39% stenosis. Left Carotid: Velocities in the left ICA are consistent with a 1-39% stenosis. Vertebrals: Bilateral vertebral arteries demonstrate antegrade flow.

## 2021-12-19 NOTE — Progress Notes (Signed)
Carotid duplex has been completed.   Preliminary results in CV Proc.   [name] 12/19/2021 9:13 AM

## 2021-12-19 NOTE — Progress Notes (Signed)
  Echocardiogram 2D Echocardiogram has been performed.  [name] 12/19/2021, 8:51 AM

## 2021-12-21 ENCOUNTER — Other Ambulatory Visit (INDEPENDENT_AMBULATORY_CARE_PROVIDER_SITE_OTHER): Payer: Self-pay | Admitting: Family Medicine

## 2021-12-21 DIAGNOSIS — E559 Vitamin D deficiency, unspecified: Secondary | ICD-10-CM

## 2021-12-21 DIAGNOSIS — I351 Nonrheumatic aortic (valve) insufficiency: Secondary | ICD-10-CM | POA: Insufficient documentation

## 2021-12-21 LAB — ECHOCARDIOGRAM COMPLETE
AR max vel: 1.69 cm2
AV Area VTI: 1.67 cm2
AV Area mean vel: 1.65 cm2
AV Mean grad: 8.7 mmHg
AV Peak grad: 16.2 mmHg
Ao pk vel: 2.01 m/s
Area-P 1/2: 2.5 cm2
Calc EF: 62.9 %
S' Lateral: 3.4 cm
Single Plane A2C EF: 60.3 %
Single Plane A4C EF: 63.5 %

## 2021-12-21 NOTE — Progress Notes (Signed)
Echocardiogram 12/19/2021:   1. Left ventricular ejection fraction, by estimation, is 55 to 60%. Left ventricular ejection fraction by 2D MOD biplane is 62.9 %. The left ventricle has normal function. The left ventricle has no regional wall motion abnormalities. Left ventricular  diastolic parameters were normal.  2. Right ventricular systolic function is normal. The right ventricular size is normal. There is normal pulmonary artery systolic pressure. The estimated right ventricular systolic pressure is 32.1 mmHg.  3. Left atrial size was moderately dilated.  4. The mitral valve is normal in structure. Trivial mitral valve regurgitation.  5. Tricuspid valve regurgitation is moderate.  6. The aortic valve is tricuspid. There is mild calcification of the aortic valve. Aortic valve regurgitation is mild. Aortic valve sclerosis/calcification is present, without any evidence of aortic stenosis. Aortic valve area, by VTI measures 1.67 cm.  Aortic valve mean gradient measures 8.7 mmHg.  7. The inferior vena cava is normal in size with greater than 50% respiratory variability, suggesting right atrial pressure of 3 mmHg.  Comparison(s): Compared to external report 06/11/2017, no change in the aortic valve stenosis, previously measured at 1.6 cm, mean gradient was 14 mmHg. Suspect no significant aortic stenosis, presence of aortic regurgitation probably reason for elevated  velocity. Otherwise no other changes.

## 2021-12-24 ENCOUNTER — Other Ambulatory Visit (INDEPENDENT_AMBULATORY_CARE_PROVIDER_SITE_OTHER): Payer: Self-pay | Admitting: Family Medicine

## 2021-12-24 DIAGNOSIS — E559 Vitamin D deficiency, unspecified: Secondary | ICD-10-CM

## 2021-12-24 DIAGNOSIS — E1169 Type 2 diabetes mellitus with other specified complication: Secondary | ICD-10-CM

## 2021-12-25 NOTE — Progress Notes (Unsigned)
TeleHealth Visit:  This visit was completed with telemedicine (audio/video) technology. [name] has verbally consented to this TeleHealth visit. The patient is located at home, the provider is located at home. The participants in this visit include the listed provider and patient. The visit was conducted today via MyChart video.  OBESITY [name] is here to discuss her progress with her obesity treatment plan along with follow-up of her obesity related diagnoses.    Today's visit was # 22  Starting weight: 186 lbs Starting date: 01/28/2018 Weight reported at last virtual office visit: 146 lbs on 11/29/21 Today's reported weight: 140 lbs  Total weight loss: 46 Weight change since last visit: -6   Nutrition Plan: practicing portion control and making smarter food choices, such as increasing vegetables and decreasing simple carbohydrates OR low carb    Current exercise:  working 3-4 days per [employer] active job  ______________________________________________________________________  Interim History: [name] is working on [employer].  Goal weight is 135-140 pounds.  She has not been following the low-carb plan but she has cut back on Cokes and [employer] drinks.  She is down 6 pounds from our last virtual visit.  She is having protein at all meals. She is working almost full-time at [employer].  Labs at PCP on 11/28/21 A1c - 5.3 Vit D level - 38.9 Hgb - 12.5 AST - 63 ALT - 63 B12 - 437 LDL 56, HDL 49, Tg 65 TSH - 1.1  Assessment/Plan:  We discussed recent lab results in depth.  1. Type II Diabetes HgbA1c is at goal. Last A1c was 0.3 on 11/28/2021. Medication(s): Ozempic 1 mg weekly.  Tolerating well.  Appetite well controlled. Metformin stopped recently by PCP. Lab Results  Component Value Date   HGBA1C 5.6 05/04/2021   HGBA1C 5.2 12/13/2020   HGBA1C 5.1 10/15/2019   Lab Results  Component Value Date   MICROALBUR <0.7 07/03/2017   LDLCALC 47 12/13/2020    CREATININE 0.80 05/04/2021    Plan: Refill Ozempic 1 mg weekly.  2. Vitamin D Deficiency Vitamin D is not at goal of 50.  Last vitamin D level was 38.9 at PCP.  High-dose vitamin D twice weekly maintains her vitamin D between 30 and 40.  Lab Results  Component Value Date   VD25OH 40.6 05/04/2021   VD25OH 37.8 12/13/2020   VD25OH 43.5 10/15/2019    Plan: Refill prescription vitamin D 50,000 IU twice weekly.   3. Obesity: Current BMI 28.26 [name] is currently in the action stage of change. As such, her goal is to maintain weight for now.  She has agreed to practicing portion control and making smarter food choices, such as increasing vegetables and decreasing simple carbohydrates.   Exercise goals: Continue to be active at work.  Behavioral modification strategies: decreasing liquid calories and planning for success.  [name] has agreed to follow-up with our clinic in 4 weeks.   No orders of the defined types were placed in this encounter.   Medications Discontinued During This Encounter  Medication Reason   Vitamin D, Ergocalciferol, (DRISDOL) 1.25 MG (50000 UNIT) CAPS capsule Reorder   Semaglutide, 1 MG/DOSE, 4 MG/3ML SOPN Reorder     Meds ordered this encounter  Medications   Semaglutide, 1 MG/DOSE, 4 MG/3ML SOPN    Sig: Inject 1 mg as directed once a week.    Dispense:  3 mL    Refill:  0    Order Specific Question:   Supervising Provider    Answer:  BOWEN, KAREN E [[id number]]   Vitamin D, Ergocalciferol, (DRISDOL) 1.25 MG (50000 UNIT) CAPS capsule    Sig: Take 1 capsule (50,000 Units total) by mouth 2 (two) times a week.    Dispense:  30 capsule    Refill:  0    Order Specific Question:   Supervising Provider    Answer:   Dell Ponto [[id number]]      Objective:   VITALS: Per patient if applicable, see vitals. GENERAL: Alert and in no acute distress. CARDIOPULMONARY: No increased WOB. Speaking in clear sentences.  PSYCH: Pleasant and cooperative. Speech normal rate  and rhythm. Affect is appropriate. Insight and judgement are appropriate. Attention is focused, linear, and appropriate.  NEURO: Oriented as arrived to appointment on time with no prompting.   Lab Results  Component Value Date   CREATININE 0.80 05/04/2021   BUN 20 05/04/2021   NA 143 05/04/2021   K 4.6 05/04/2021   CL 106 05/04/2021   CO2 22 05/04/2021   Lab Results  Component Value Date   ALT 51 (H) 05/04/2021   AST 61 (H) 05/04/2021   ALKPHOS 68 05/04/2021   BILITOT 0.4 05/04/2021   Lab Results  Component Value Date   HGBA1C 5.6 05/04/2021   HGBA1C 5.2 12/13/2020   HGBA1C 5.1 10/15/2019   HGBA1C 4.8 04/08/2019   HGBA1C 5.2 09/25/2018   Lab Results  Component Value Date   INSULIN 4.4 12/13/2020   INSULIN 9.5 10/15/2019   INSULIN 3.4 04/08/2019   INSULIN 3.5 09/25/2018   INSULIN 4.3 01/29/2018   Lab Results  Component Value Date   TSH 2.160 09/25/2018   Lab Results  Component Value Date   CHOL 110 12/13/2020   HDL 49 12/13/2020   LDLCALC 47 12/13/2020   TRIG 64 12/13/2020   CHOLHDL 2 07/03/2017   Lab Results  Component Value Date   WBC 4.5 05/04/2021   HGB 11.1 05/04/2021   HCT 36.5 05/04/2021   MCV 88 05/04/2021   PLT 158 05/04/2021   Lab Results  Component Value Date   IRON 49 05/04/2021   TIBC 449 05/04/2021   FERRITIN 11 (L) 05/04/2021   Lab Results  Component Value Date   VD25OH 40.6 05/04/2021   VD25OH 37.8 12/13/2020   VD25OH 43.5 10/15/2019    Attestation Statements:   Reviewed by clinician on day of visit: allergies, medications, problem list, medical history, surgical history, family history, social history, and previous encounter notes.

## 2021-12-26 ENCOUNTER — Telehealth (INDEPENDENT_AMBULATORY_CARE_PROVIDER_SITE_OTHER): Payer: Medicare Other | Admitting: Family Medicine

## 2021-12-26 ENCOUNTER — Encounter (INDEPENDENT_AMBULATORY_CARE_PROVIDER_SITE_OTHER): Payer: Self-pay | Admitting: Family Medicine

## 2021-12-26 VITALS — Ht 59.0 in | Wt 140.0 lb

## 2021-12-26 DIAGNOSIS — Z7985 Long-term (current) use of injectable non-insulin antidiabetic drugs: Secondary | ICD-10-CM

## 2021-12-26 DIAGNOSIS — E669 Obesity, unspecified: Secondary | ICD-10-CM | POA: Diagnosis not present

## 2021-12-26 DIAGNOSIS — Z6828 Body mass index (BMI) 28.0-28.9, adult: Secondary | ICD-10-CM

## 2021-12-26 DIAGNOSIS — E559 Vitamin D deficiency, unspecified: Secondary | ICD-10-CM

## 2021-12-26 DIAGNOSIS — E1169 Type 2 diabetes mellitus with other specified complication: Secondary | ICD-10-CM | POA: Diagnosis not present

## 2021-12-26 MED ORDER — VITAMIN D (ERGOCALCIFEROL) 1.25 MG (50000 UNIT) PO CAPS: 50000.0000 [IU] | ORAL_CAPSULE | ORAL | 0 refills | Status: DC

## 2021-12-26 MED ORDER — SEMAGLUTIDE (1 MG/DOSE) 4 MG/3ML ~~LOC~~ SOPN: 1.0000 mg | PEN_INJECTOR | SUBCUTANEOUS | 0 refills | Status: DC

## 2022-01-17 ENCOUNTER — Other Ambulatory Visit (INDEPENDENT_AMBULATORY_CARE_PROVIDER_SITE_OTHER): Payer: Self-pay | Admitting: Family Medicine

## 2022-01-17 DIAGNOSIS — E1169 Type 2 diabetes mellitus with other specified complication: Secondary | ICD-10-CM

## 2022-01-18 NOTE — Progress Notes (Signed)
TeleHealth Visit:  This visit was completed with telemedicine (audio/video) technology. [name] has verbally consented to this TeleHealth visit. The patient is located at home, the provider is located at home. The participants in this visit include the listed provider and patient. The visit was conducted today via MyChart video.  OBESITY [name] is here to discuss her progress with her obesity treatment plan along with follow-up of her obesity related diagnoses.    Today's visit was # 44  Starting weight: 186 lbs Starting date: 01/28/2018 Weight reported at last virtual office visit: 140 lbs on 12/26/21 Today's reported weight: 137 lbs  Total weight loss: 49 Weight change since last visit: -3  Nutrition Plan: practicing portion control and making smarter food choices, such as increasing vegetables and decreasing simple carbohydrates OR low carb    Current exercise: working 3-4 days per [employer] active job  Interim History: [name] is very happy because she lost another 3 pounds over the holidays.  She and her husband are eating mostly low-carb.  Her husband is a patient also.  She reports protein and vegetable intake is good.  Water is adequate.  She has to moderate water intake due to bladder control issues. She got a goldendoodle puppy named Pass [place] for Christmas.  She is already doing more walking because of the puppy.  Weight goal is 135-140 pounds.  She prefers to be around 135 pounds.  Labs at PCP on 11/28/21 (not in [facility]) A1c - 5.3 Vit D level - 38.9 Hgb - 12.5 AST - 63 ALT - 63 B12 - 437 LDL 56, HDL 49, Tg 65 TSH - 1.1  Assessment/Plan:  1. Type II Diabetes HgbA1c is at goal. Last A1c was 5.3 on 11/28/2021. Episodes of hypoglycemia: no Medication(s): Ozempic 1 mg weekly.  Appetite well-controlled.  Lab Results  Component Value Date   HGBA1C 5.6 05/04/2021   HGBA1C 5.2 12/13/2020   HGBA1C 5.1 10/15/2019   Lab Results  Component Value Date   MICROALBUR <0.7  07/03/2017   LDLCALC 47 12/13/2020   CREATININE 0.80 05/04/2021    Plan: Refill Ozempic 1 mg weekly.  2.  Other depression with emotional eating Mood stable on bupropion XL 300 mg daily and Cymbalta 60 mg daily.  Emotional eating well-controlled.  Plan: Continue bupropion and Cymbalta at current dosages.  3. Obesity: Current BMI 27.7 [name] is currently in the action stage of change. As such, her goal is to continue with weight loss efforts.  She has agreed to practicing portion control and making smarter food choices, such as increasing vegetables and decreasing simple carbohydrates and following a lower carbohydrate, vegetable and lean protein rich diet plan.   Exercise goals: Increase daily walking.  Behavioral modification strategies: increasing lean protein intake, decreasing simple carbohydrates, and planning for success.  [name] has agreed to follow-up with our clinic in 4 weeks.   No orders of the defined types were placed in this encounter.   Medications Discontinued During This Encounter  Medication Reason   Semaglutide, 1 MG/DOSE, 4 MG/3ML SOPN Reorder     Meds ordered this encounter  Medications   Semaglutide, 1 MG/DOSE, 4 MG/3ML SOPN    Sig: Inject 1 mg as directed once a week.    Dispense:  3 mL    Refill:  0    Order Specific Question:   Supervising Provider    Answer:   Dell Ponto [[id number]]      Objective:   VITALS: Per patient if applicable, see vitals. GENERAL:  Alert and in no acute distress. CARDIOPULMONARY: No increased WOB. Speaking in clear sentences.  PSYCH: Pleasant and cooperative. Speech normal rate and rhythm. Affect is appropriate. Insight and judgement are appropriate. Attention is focused, linear, and appropriate.  NEURO: Oriented as arrived to appointment on time with no prompting.   Lab Results  Component Value Date   CREATININE 0.80 05/04/2021   BUN 20 05/04/2021   NA 143 05/04/2021   K 4.6 05/04/2021   CL 106 05/04/2021   CO2 22  05/04/2021   Lab Results  Component Value Date   ALT 51 (H) 05/04/2021   AST 61 (H) 05/04/2021   ALKPHOS 68 05/04/2021   BILITOT 0.4 05/04/2021   Lab Results  Component Value Date   HGBA1C 5.6 05/04/2021   HGBA1C 5.2 12/13/2020   HGBA1C 5.1 10/15/2019   HGBA1C 4.8 04/08/2019   HGBA1C 5.2 09/25/2018   Lab Results  Component Value Date   INSULIN 4.4 12/13/2020   INSULIN 9.5 10/15/2019   INSULIN 3.4 04/08/2019   INSULIN 3.5 09/25/2018   INSULIN 4.3 01/29/2018   Lab Results  Component Value Date   TSH 2.160 09/25/2018   Lab Results  Component Value Date   CHOL 110 12/13/2020   HDL 49 12/13/2020   LDLCALC 47 12/13/2020   TRIG 64 12/13/2020   CHOLHDL 2 07/03/2017   Lab Results  Component Value Date   WBC 4.5 05/04/2021   HGB 11.1 05/04/2021   HCT 36.5 05/04/2021   MCV 88 05/04/2021   PLT 158 05/04/2021   Lab Results  Component Value Date   IRON 49 05/04/2021   TIBC 449 05/04/2021   FERRITIN 11 (L) 05/04/2021   Lab Results  Component Value Date   VD25OH 40.6 05/04/2021   VD25OH 37.8 12/13/2020   VD25OH 43.5 10/15/2019    Attestation Statements:   Reviewed by clinician on day of visit: allergies, medications, problem list, medical history, surgical history, family history, social history, and previous encounter notes.

## 2022-01-23 ENCOUNTER — Encounter (INDEPENDENT_AMBULATORY_CARE_PROVIDER_SITE_OTHER): Payer: Self-pay | Admitting: Family Medicine

## 2022-01-23 ENCOUNTER — Telehealth (INDEPENDENT_AMBULATORY_CARE_PROVIDER_SITE_OTHER): Payer: Medicare Other | Admitting: Family Medicine

## 2022-01-23 VITALS — Ht 59.0 in | Wt 137.0 lb

## 2022-01-23 DIAGNOSIS — Z6827 Body mass index (BMI) 27.0-27.9, adult: Secondary | ICD-10-CM | POA: Diagnosis not present

## 2022-01-23 DIAGNOSIS — F3289 Other specified depressive episodes: Secondary | ICD-10-CM | POA: Diagnosis not present

## 2022-01-23 DIAGNOSIS — E669 Obesity, unspecified: Secondary | ICD-10-CM | POA: Diagnosis not present

## 2022-01-23 DIAGNOSIS — E1169 Type 2 diabetes mellitus with other specified complication: Secondary | ICD-10-CM

## 2022-01-23 DIAGNOSIS — Z7985 Long-term (current) use of injectable non-insulin antidiabetic drugs: Secondary | ICD-10-CM

## 2022-01-23 MED ORDER — SEMAGLUTIDE (1 MG/DOSE) 4 MG/3ML ~~LOC~~ SOPN: 1.0000 mg | PEN_INJECTOR | SUBCUTANEOUS | 0 refills | Status: DC

## 2022-02-19 NOTE — Progress Notes (Unsigned)
TeleHealth Visit:  This visit was completed with telemedicine (audio/video) technology. [name] has verbally consented to this TeleHealth visit. The patient is located at home, the provider is located at home. The participants in this visit include the listed provider and patient. The visit was conducted today via MyChart video.  OBESITY [name] is here to discuss her progress with her obesity treatment plan along with follow-up of her obesity related diagnoses.    Today's visit was # 49  Starting weight: 186 lbs Starting date: 01/28/2018 Weight reported at last virtual office visit: 137 lbs on 01/23/22 Today's reported weight: 140 lbs  Total weight loss: 46 Weight change since last visit: +3  Nutrition Plan: practicing portion control and making smarter food choices, such as increasing vegetables and decreasing simple carbohydrates OR low carb    Current exercise: working 4-5 days per [employer] active job.  PT twice weekly for neck and arm pain (disc issue).  Weight goal is 135-140 pounds.  She prefers to be around 135 pounds.   Labs at PCP on 11/28/21 (not in [facility]) A1c - 5.3 Vit D level - 38.9 Hgb - 12.5 AST - 63 ALT - 63 B12 - 437 LDL 56, HDL 49, Tg 65 TSH - 1.1  Interim History: [name] is up about 3 pounds but is not concerned about it because she says she knows how to get it off.  She has not been following low-carb as strictly as she was.  She is still avoiding potatoes and junk food.  She snacks on tomatoes and mandrin oranges.  She eats plenty of protein and vegetables.  She is going to physical therapy twice weekly for neck and arm pain.  She is doing some resistance training at physical therapy.  Will be attending for the next 4 weeks.  She is enjoying her goldendoodle puppy Willow that she got for Christmas.  Assessment/Plan:  1. Eating disorder/emotional eating [name] has had issues with stress/emotional eating. Currently this is well controlled. Overall mood is  stable.  However she is stressed currently because her son is not doing well.  He has lost over 250 pounds in the last year after gastric sleeve and is unable to eat much at all.  He is seeing his bariatric surgeon today. Medication(s): Bupropion XL 300 mg daily and Cymbalta 60 mg daily.  Plan:  Continue bupropion and Cymbalta at current dosages.  2. Type II Diabetes Well-controlled.  Last A1c was 5.3 on 11/28/2021 at her PCP. Medication(s): Ozempic 1 mg weekly.  Appetite and cravings well-controlled.  Lab Results  Component Value Date   HGBA1C 5.6 05/04/2021   HGBA1C 5.2 12/13/2020   HGBA1C 5.1 10/15/2019   Lab Results  Component Value Date   MICROALBUR <0.7 07/03/2017   LDLCALC 47 12/13/2020   CREATININE 0.80 05/04/2021    Plan: Continue Ozempic 1 mg weekly.   3. Obesity: Current BMI 28 [name] is currently in the action stage of change. As such, her goal is to continue with weight loss efforts.  She has agreed to practicing portion control and making smarter food choices, such as increasing vegetables and decreasing simple carbohydrates and following a lower carbohydrate, vegetable and lean protein rich diet plan.   Exercise goals: As is.  Discussed continuing resistance training 2-3 times weekly after physical therapy.  Behavioral modification strategies: increasing lean protein intake, decreasing simple carbohydrates, and planning for success.  [name] has agreed to follow-up with our clinic in 4 weeks.   No orders of the defined  types were placed in this encounter.   Medications Discontinued During This Encounter  Medication Reason   amoxicillin-clavulanate (AUGMENTIN) 250-125 MG tablet Completed Course     No orders of the defined types were placed in this encounter.     Objective:   VITALS: Per patient if applicable, see vitals. GENERAL: Alert and in no acute distress. CARDIOPULMONARY: No increased WOB. Speaking in clear sentences.  PSYCH: Pleasant and  cooperative. Speech normal rate and rhythm. Affect is appropriate. Insight and judgement are appropriate. Attention is focused, linear, and appropriate.  NEURO: Oriented as arrived to appointment on time with no prompting.   Lab Results  Component Value Date   CREATININE 0.80 05/04/2021   BUN 20 05/04/2021   NA 143 05/04/2021   K 4.6 05/04/2021   CL 106 05/04/2021   CO2 22 05/04/2021   Lab Results  Component Value Date   ALT 51 (H) 05/04/2021   AST 61 (H) 05/04/2021   ALKPHOS 68 05/04/2021   BILITOT 0.4 05/04/2021   Lab Results  Component Value Date   HGBA1C 5.6 05/04/2021   HGBA1C 5.2 12/13/2020   HGBA1C 5.1 10/15/2019   HGBA1C 4.8 04/08/2019   HGBA1C 5.2 09/25/2018   Lab Results  Component Value Date   INSULIN 4.4 12/13/2020   INSULIN 9.5 10/15/2019   INSULIN 3.4 04/08/2019   INSULIN 3.5 09/25/2018   INSULIN 4.3 01/29/2018   Lab Results  Component Value Date   TSH 2.160 09/25/2018   Lab Results  Component Value Date   CHOL 110 12/13/2020   HDL 49 12/13/2020   LDLCALC 47 12/13/2020   TRIG 64 12/13/2020   CHOLHDL 2 07/03/2017   Lab Results  Component Value Date   WBC 4.5 05/04/2021   HGB 11.1 05/04/2021   HCT 36.5 05/04/2021   MCV 88 05/04/2021   PLT 158 05/04/2021   Lab Results  Component Value Date   IRON 49 05/04/2021   TIBC 449 05/04/2021   FERRITIN 11 (L) 05/04/2021   Lab Results  Component Value Date   VD25OH 40.6 05/04/2021   VD25OH 37.8 12/13/2020   VD25OH 43.5 10/15/2019    Attestation Statements:   Reviewed by clinician on day of visit: allergies, medications, problem list, medical history, surgical history, family history, social history, and previous encounter notes.  Time spent on visit including the items listed below was 30 minutes.  -preparing to see the patient (e.g., review of tests, history, previous notes) -obtaining and/or reviewing separately obtained history -counseling and educating the  patient/family/caregiver -documenting clinical information in the electronic or other health record -ordering medications, tests, or procedures -independently interpreting results and communicating results to the patient/ family/caregiver -referring and communicating with other health care professionals  -care coordination

## 2022-02-20 ENCOUNTER — Encounter (INDEPENDENT_AMBULATORY_CARE_PROVIDER_SITE_OTHER): Payer: Self-pay | Admitting: Family Medicine

## 2022-02-20 ENCOUNTER — Telehealth (INDEPENDENT_AMBULATORY_CARE_PROVIDER_SITE_OTHER): Payer: Medicare Other | Admitting: Family Medicine

## 2022-02-20 VITALS — Ht 59.0 in | Wt 140.0 lb

## 2022-02-20 DIAGNOSIS — Z7985 Long-term (current) use of injectable non-insulin antidiabetic drugs: Secondary | ICD-10-CM

## 2022-02-20 DIAGNOSIS — E669 Obesity, unspecified: Secondary | ICD-10-CM | POA: Insufficient documentation

## 2022-02-20 DIAGNOSIS — F3289 Other specified depressive episodes: Secondary | ICD-10-CM | POA: Diagnosis not present

## 2022-02-20 DIAGNOSIS — Z6828 Body mass index (BMI) 28.0-28.9, adult: Secondary | ICD-10-CM

## 2022-02-20 DIAGNOSIS — E1169 Type 2 diabetes mellitus with other specified complication: Secondary | ICD-10-CM | POA: Diagnosis not present

## 2022-03-19 NOTE — Progress Notes (Deleted)
  TeleHealth Visit:  This visit was completed with telemedicine (audio/video) technology. Pele has verbally consented to this TeleHealth visit. The patient is located at home, the provider is located at home. The participants in this visit include the listed provider and patient. The visit was conducted today via MyChart video.  OBESITY [name] is here to discuss her progress with her obesity treatment plan along with follow-up of her obesity related diagnoses.    Today's visit was # 79  Starting weight: 186 lbs Starting date: 01/28/2018 Weight reported at last virtual office visit: 140 lbs on 02/20/22 Today's reported weight: *** lbs No weight reported. Weight at clinic on ***: *** lbs Total weight loss: *** Weight change since last visit: ***   Nutrition Plan: practicing portion control and making smarter food choices, such as increasing vegetables and decreasing simple carbohydrates OR low carb    Current exercise:  *** working 4-5 days per [employer] active job.  PT twice weekly for neck and arm pain (disc issue).  Interim History: ***  Labs at PCP on 11/28/21 (not in [facility]) A1c - 5.3 Vit D level - 38.9 Hgb - 12.5 AST - 63 ALT - 63 B12 - 437 LDL 56, HDL 49, Tg 65 TSH - 1.1 Assessment/Plan:  1. ***  2. ***  3. ***  {dwwmorbid:29108::"Morbid Obesity"}: Current BMI *** Antiobesity Medication Plan {dwwmed:29123} {dwwglp:29109}.  Lauranne {[facility] AMB IS/IS NOT:210130109} currently in the action stage of change. As such, her goal is to {MWMwtloss#1:210800005}.  She has agreed to {dwwsldiets:29085}.  Exercise goals: {MWM EXERCISE RECS:23473}  Behavioral modification strategies: {dwwslwtlossstrategies:29088}.  [place] has agreed to follow-up with our clinic in {NUMBER 1-10:22536} weeks.   No orders of the defined types were placed in this encounter.   There are no discontinued medications.   No orders of the defined types were placed in this encounter.     Objective:    VITALS: Per patient if applicable, see vitals. GENERAL: Alert and in no acute distress. CARDIOPULMONARY: No increased WOB. Speaking in clear sentences.  PSYCH: Pleasant and cooperative. Speech normal rate and rhythm. Affect is appropriate. Insight and judgement are appropriate. Attention is focused, linear, and appropriate.  NEURO: Oriented as arrived to appointment on time with no prompting.   Attestation Statements:   Reviewed by clinician on day of visit: allergies, medications, problem list, medical history, surgical history, family history, social history, and previous encounter notes.  ***(delete if time-based billing not used) Time spent on visit including the items listed below was *** minutes.  -preparing to see the patient (e.g., review of tests, history, previous notes) -obtaining and/or reviewing separately obtained history -counseling and educating the patient/family/caregiver -documenting clinical information in the electronic or other health record -ordering medications, tests, or procedures -independently interpreting results and communicating results to the patient/ family/caregiver -referring and communicating with other health care professionals  -care coordination    This was prepared with the assistance of [employer].  Occasional wrong-word or sound-a-like substitutions may have occurred due to the inherent limitations of voice recognition software.

## 2022-03-20 ENCOUNTER — Telehealth (INDEPENDENT_AMBULATORY_CARE_PROVIDER_SITE_OTHER): Payer: Medicare Other | Admitting: Family Medicine

## 2022-03-27 NOTE — Progress Notes (Unsigned)
TeleHealth Visit:  This visit was completed with telemedicine (audio/video) technology. [name] has verbally consented to this TeleHealth visit. The patient is located at home, the provider is located at home. The participants in this visit include the listed provider and patient. The visit was conducted today via MyChart video.  OBESITY [name] is here to discuss her progress with her obesity treatment plan along with follow-up of her obesity related diagnoses.    Today's visit was # 86  Starting weight: 186 lbs Starting date: 01/28/2018 Weight reported at last virtual office visit: 140 lbs on 02/20/22 Today's reported weight: 137 lbs  Total weight loss: 49 Weight change since last visit: -3  Nutrition Plan: practicing portion control and making smarter food choices, such as increasing vegetables and decreasing simple carbohydrates OR low carb    Current exercise: working 4-5 days per [employer] active job.  PT twice weekly for neck and arm pain (disc issue).   Weight goal is 135-140 pounds.  She prefers to be around 135 pounds.  Interim History: Appetite has been low because she has been sick. Protein intake is good. Snacks on Flavor Bomb cherry tomatoes. She has had some sweet cravings recently. She is always concerned about regaining her weight and prefers to stay around 135 pounds. She would like to increase her Ozempic dose to help with cravings.  She would like to go back on her initial category two +100-calorie plan to reset.  She also feels this will help her husband who is also a patient.  Her PCP in [place] has left the practice.  Labs at PCP on 11/28/21 A1c - 5.3 Vit D level - 38.9 Hgb - 12.5 AST - 63 ALT - 63 B12 - 437 LDL 56, HDL 49, Tg 65 TSH - 1.1  Assessment/Plan:  1. Type 2 Diabetes Mellitus with other specified complication, without long-term current use of insulin HgbA1c is at goal. Last A1c was 5.3 on 11/28/21. CBGs: Not checking Episodes of  hypoglycemia: no Medication(s):  Ozempic 1 mg SQ weekly  Lab Results  Component Value Date   HGBA1C 5.6 05/04/2021   HGBA1C 5.2 12/13/2020   HGBA1C 5.1 10/15/2019   Lab Results  Component Value Date   MICROALBUR <0.7 07/03/2017   LDLCALC 47 12/13/2020   CREATININE 0.80 05/04/2021   Lab Results  Component Value Date   GFR 64.07 07/03/2017   GFR 81.30 09/06/2016   GFR 78.98 03/14/2016    Plan: Continue and increase dose Ozempic 2 mg SQ weekly   2. Vitamin D Deficiency Vitamin D is not at goal of 50.  Most recent vitamin D level was 38.7 in December 2023.[name] She is on  prescription ergocalciferol 50,000 IU twice weekly. Lab Results  Component Value Date   VD25OH 40.6 05/04/2021   VD25OH 37.8 12/13/2020   VD25OH 43.5 10/15/2019    Plan: Refill prescription vitamin D 50,000 IU twice weekly.   3. Generalized Obesity: Current BMI 27  [name] is currently in the action stage of change. As such, her goal is to continue with weight loss efforts.  She has agreed to the Category 2 plan plus 100 cal and practicing portion control and making smarter food choices, such as increasing vegetables and decreasing simple carbohydrates.  Work on keeping sweets out of the house. Try to go "cold [place]" off of sweets.  Exercise goals:  as is  Behavioral modification strategies: increasing lean protein intake and decreasing simple carbohydrates .  [place] has agreed to follow-up with our  clinic in 4 weeks.   No orders of the defined types were placed in this encounter.   Medications Discontinued During This Encounter  Medication Reason   Semaglutide, 1 MG/DOSE, 4 MG/3ML SOPN Dose change   Vitamin D, Ergocalciferol, (DRISDOL) 1.25 MG (50000 UNIT) CAPS capsule Reorder     Meds ordered this encounter  Medications   Semaglutide, 2 MG/DOSE, 8 MG/3ML SOPN    Sig: Inject 2 mg as directed once a week.    Dispense:  3 mL    Refill:  0    Order Specific Question:   Supervising Provider     Answer:   Netty Starring   Vitamin D, Ergocalciferol, (DRISDOL) 1.25 MG (50000 UNIT) CAPS capsule    Sig: Take 1 capsule (50,000 Units total) by mouth 2 (two) times a week.    Dispense:  30 capsule    Refill:  0    Order Specific Question:   Supervising Provider    Answer:   Dell Ponto [[id number]]      Objective:   VITALS: Per patient if applicable, see vitals. GENERAL: Alert and in no acute distress. CARDIOPULMONARY: No increased WOB. Speaking in clear sentences.  PSYCH: Pleasant and cooperative. Speech normal rate and rhythm. Affect is appropriate. Insight and judgement are appropriate. Attention is focused, linear, and appropriate.  NEURO: Oriented as arrived to appointment on time with no prompting.   Attestation Statements:   Reviewed by clinician on day of visit: allergies, medications, problem list, medical history, surgical history, family history, social history, and previous encounter notes.    This was prepared with the assistance of [employer].  Occasional wrong-word or sound-a-like substitutions may have occurred due to the inherent limitations of voice recognition software.

## 2022-03-28 ENCOUNTER — Telehealth (INDEPENDENT_AMBULATORY_CARE_PROVIDER_SITE_OTHER): Payer: Medicare Other | Admitting: Family Medicine

## 2022-03-28 ENCOUNTER — Encounter (INDEPENDENT_AMBULATORY_CARE_PROVIDER_SITE_OTHER): Payer: Self-pay | Admitting: Family Medicine

## 2022-03-28 VITALS — Ht 59.0 in | Wt 137.0 lb

## 2022-03-28 DIAGNOSIS — E1169 Type 2 diabetes mellitus with other specified complication: Secondary | ICD-10-CM | POA: Diagnosis not present

## 2022-03-28 DIAGNOSIS — E559 Vitamin D deficiency, unspecified: Secondary | ICD-10-CM | POA: Diagnosis not present

## 2022-03-28 DIAGNOSIS — Z7985 Long-term (current) use of injectable non-insulin antidiabetic drugs: Secondary | ICD-10-CM

## 2022-03-28 DIAGNOSIS — E669 Obesity, unspecified: Secondary | ICD-10-CM | POA: Diagnosis not present

## 2022-03-28 DIAGNOSIS — Z6827 Body mass index (BMI) 27.0-27.9, adult: Secondary | ICD-10-CM

## 2022-03-28 MED ORDER — VITAMIN D (ERGOCALCIFEROL) 1.25 MG (50000 UNIT) PO CAPS: 50000.0000 [IU] | ORAL_CAPSULE | ORAL | 0 refills | Status: DC

## 2022-03-28 MED ORDER — SEMAGLUTIDE (2 MG/DOSE) 8 MG/3ML ~~LOC~~ SOPN: 2.0000 mg | PEN_INJECTOR | SUBCUTANEOUS | 0 refills | Status: DC

## 2022-04-19 ENCOUNTER — Other Ambulatory Visit (INDEPENDENT_AMBULATORY_CARE_PROVIDER_SITE_OTHER): Payer: Self-pay | Admitting: Family Medicine

## 2022-04-19 DIAGNOSIS — E1169 Type 2 diabetes mellitus with other specified complication: Secondary | ICD-10-CM

## 2022-04-24 ENCOUNTER — Telehealth (INDEPENDENT_AMBULATORY_CARE_PROVIDER_SITE_OTHER): Payer: Medicare Other | Admitting: Family Medicine

## 2022-04-26 NOTE — Progress Notes (Signed)
TeleHealth Visit:  This visit was completed with telemedicine (audio/video) technology. [name] has verbally consented to this TeleHealth visit. The patient is located at home, the provider is located at home. The participants in this visit include the listed provider and patient. The visit was conducted today via MyChart video.  OBESITY [name] is here to discuss her progress with her obesity treatment plan along with follow-up of her obesity related diagnoses.    Today's visit was # 49  Starting weight: 186 lbs Starting date: 01/28/2018 Weight reported at last virtual office visit: 137 lbs on 03/28/22 Today's reported weight (04/30/22):  138 lbs Total weight loss: 48 Weight change since last visit: +1   Nutrition Plan: practicing portion control and making smarter food choices, such as increasing vegetables and decreasing simple carbohydrates    Current exercise:   working 4-5 days per [employer] active job.   Interim History:  [name] dose increased to 2 mg last OV.  She does note less hunger and cravings but has only had a few doses.  She was off of it the last few weeks due to eye surgery (blepharoplasty bilateral for sagging eyelids).  She says she is eating less sweets. She did not start the category 2 plan +100 as discussed because her husband will not eat the food that she cooks.  Breakfast- shake with bananas Lunch- sandwich or salad.  Dinner-leftovers.  Labs at PCP on 11/28/21 A1c - 5.3 Vit D level - 38.9 Hgb - 12.5 AST - 63 ALT - 63 B12 - 437 LDL 56, HDL 49, Tg 65 TSH - 1.1 Assessment/Plan:  1. Type 2 Diabetes Mellitus with other specified complication, without long-term current use of insulin HgbA1c is at goal. Last A1c was 5.3 at PCP on 11/28/2021. CBGs: Not checking Episodes of hypoglycemia: no Medication(s): [name] 2 mg SQ weekly  Lab Results  Component Value Date   HGBA1C 5.6 05/04/2021   HGBA1C 5.2 12/13/2020   HGBA1C 5.1 10/15/2019   Lab Results   Component Value Date   MICROALBUR <0.7 07/03/2017   LDLCALC 47 12/13/2020   CREATININE 0.80 05/04/2021   Lab Results  Component Value Date   GFR 64.07 07/03/2017   GFR 81.30 09/06/2016   GFR 78.98 03/14/2016    Plan: Continue and refill [name] 2 mg SQ weekly   2. Gastroesophageal reflux disease without esophagitis Symptoms are well-controlled. Medication: Omeprazole 20 mg daily  Plan: Continue PPI.  3. Other depression/emotional eating [name] has had issues with stress/emotional eating. Currently this is moderately controlled. Overall mood is stable.  She gets very frustrated with her husband who should be following our meal plan (he is a patient) but does not. Medication(s): Bupropion XL 300 mg daily, Cymbalta 60 mg daily.  Plan: Continue medications at current dosages. Encouragement provided.  4. Generalized Obesity: Current BMI 27 [name] is currently in the action stage of change. As such, her goal is to continue with weight loss efforts.  She has agreed to practicing portion control and making smarter food choices, such as increasing vegetables and decreasing simple carbohydrates or category 2+100 cal.  1.  Encouraged her to focus on her own journey.  [name] for herself rather than worrying about whether her husband will eat the food or not.  Exercise goals: As is  Behavioral modification strategies: increasing lean protein intake, decreasing simple carbohydrates , meal planning , and planning for success.  [name] has agreed to follow-up with our clinic in 4 weeks.   No orders of the  defined types were placed in this encounter.   Medications Discontinued During This Encounter  Medication Reason   [name], 2 MG/DOSE, 8 MG/3ML SOPN Reorder     Meds ordered this encounter  Medications   Semaglutide, 2 MG/DOSE, ([name], 2 MG/DOSE,) 8 MG/3ML SOPN    Sig: Inject 2 mg into the skin once a week.    Dispense:  3 mL    Refill:  0    Order Specific Question:    Supervising Provider    Answer:   Glennis Brink [[id number]]      Objective:   VITALS: Per patient if applicable, see vitals. GENERAL: Alert and in no acute distress. CARDIOPULMONARY: No increased WOB. Speaking in clear sentences.  PSYCH: Pleasant and cooperative. Speech normal rate and rhythm. Affect is appropriate. Insight and judgement are appropriate. Attention is focused, linear, and appropriate.  NEURO: Oriented as arrived to appointment on time with no prompting.   Attestation Statements:   Reviewed by clinician on day of visit: allergies, medications, problem list, medical history, surgical history, family history, social history, and previous encounter notes.   This was prepared with the assistance of [employer].  Occasional wrong-word or sound-a-like substitutions may have occurred due to the inherent limitations of voice recognition software.

## 2022-04-30 ENCOUNTER — Telehealth (INDEPENDENT_AMBULATORY_CARE_PROVIDER_SITE_OTHER): Payer: Medicare Other | Admitting: Family Medicine

## 2022-04-30 ENCOUNTER — Encounter (INDEPENDENT_AMBULATORY_CARE_PROVIDER_SITE_OTHER): Payer: Self-pay | Admitting: Family Medicine

## 2022-04-30 VITALS — Ht 59.0 in | Wt 138.0 lb

## 2022-04-30 DIAGNOSIS — K219 Gastro-esophageal reflux disease without esophagitis: Secondary | ICD-10-CM | POA: Diagnosis not present

## 2022-04-30 DIAGNOSIS — F3289 Other specified depressive episodes: Secondary | ICD-10-CM

## 2022-04-30 DIAGNOSIS — E669 Obesity, unspecified: Secondary | ICD-10-CM | POA: Diagnosis not present

## 2022-04-30 DIAGNOSIS — Z7985 Long-term (current) use of injectable non-insulin antidiabetic drugs: Secondary | ICD-10-CM

## 2022-04-30 DIAGNOSIS — Z6827 Body mass index (BMI) 27.0-27.9, adult: Secondary | ICD-10-CM

## 2022-04-30 DIAGNOSIS — E1169 Type 2 diabetes mellitus with other specified complication: Secondary | ICD-10-CM

## 2022-04-30 MED ORDER — OZEMPIC (2 MG/DOSE) 8 MG/3ML ~~LOC~~ SOPN: 2.0000 mg | PEN_INJECTOR | SUBCUTANEOUS | 0 refills | Status: DC

## 2022-05-15 ENCOUNTER — Other Ambulatory Visit (INDEPENDENT_AMBULATORY_CARE_PROVIDER_SITE_OTHER): Payer: Self-pay | Admitting: Family Medicine

## 2022-05-15 DIAGNOSIS — F3289 Other specified depressive episodes: Secondary | ICD-10-CM

## 2022-05-28 NOTE — Progress Notes (Signed)
  TeleHealth Visit:  This visit was completed with telemedicine (audio/video) technology. [name] has verbally consented to this TeleHealth visit. The patient is located at home, the provider is located at home. The participants in this visit include the listed provider and patient. The visit was conducted today via MyChart video.  OBESITY [name] is here to discuss her progress with her obesity treatment plan along with follow-up of her obesity related diagnoses.    Today's visit was # 50  Starting date: 01/28/2018 Weight reported at last virtual office visit: 138 lbs on 04/30/22 Today's reported weight (***): {dwwweightreported:29243} Total weight loss: *** lbs Weight change since last visit: ***  Nutrition Plan: practicing portion control and making smarter food choices, such as increasing vegetables and decreasing simple carbohydrates - ***% adherence.  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 Cravings controlled:  {EWCONTROLASSESSMENT:24261}.  Labs at PCP on 11/28/21 A1c - 5.3 Vit D level - 38.9 Hgb - 12.5 AST - 63 ALT - 63 B12 - 437 LDL 56, HDL 49, Tg 65 TSH - 1.1 Assessment/Plan:  1. ***  2. ***  3. ***  {dwwmorbid:29108::"Morbid Obesity"}: Current BMI ***  Pharmacotherapy Plan {dwwmed:29123} {dwwglp:29109}.   [name] {CHL AMB IS/IS NOT:210130109} currently in the action stage of change. As such, her goal is to {MWMwtloss#1:210800005}.  She has agreed to {dwwsldiets:29085}.  Exercise goals: {MWM EXERCISE RECS:23473}  Behavioral modification strategies: {dwwslwtlossstrategies:29088}.  [name] has agreed to follow-up with our clinic in {NUMBER 1-10:22536} weeks.   No orders  of the defined types were placed in this encounter.   There are no discontinued medications.   No orders of the defined types were placed in this encounter.     Objective:   VITALS: Per patient if applicable, see vitals. GENERAL: Alert and in no acute distress. CARDIOPULMONARY: No increased WOB. Speaking in clear sentences.  PSYCH: Pleasant and cooperative. Speech normal rate and rhythm. Affect is appropriate. Insight and judgement are appropriate. Attention is focused, linear, and appropriate.  NEURO: Oriented as arrived to appointment on time with no prompting.   Attestation Statements:   Reviewed by clinician on day of visit: allergies, medications, problem list, medical history, surgical history, family history, social history, and previous encounter notes.  ***(delete if time-based billing not used) Time spent on visit including the items listed below was *** minutes.  -preparing to see the patient (e.g., review of tests, history, previous notes) -obtaining and/or reviewing separately obtained history -counseling and educating the patient/family/caregiver -documenting clinical information in the electronic or other health record -ordering medications, tests, or procedures -independently interpreting results and communicating results to the patient/ family/caregiver -referring and communicating with other health care professionals  -care coordination    This was prepared with the assistance of [employer].  Occasional wrong-word or sound-a-like substitutions may have occurred due to the inherent limitations of voice recognition software.

## 2022-05-29 ENCOUNTER — Telehealth (INDEPENDENT_AMBULATORY_CARE_PROVIDER_SITE_OTHER): Payer: Medicare Other | Admitting: Family Medicine

## 2022-05-29 ENCOUNTER — Encounter (INDEPENDENT_AMBULATORY_CARE_PROVIDER_SITE_OTHER): Payer: Self-pay | Admitting: Family Medicine

## 2022-05-29 VITALS — Ht 59.0 in | Wt 137.0 lb

## 2022-05-29 DIAGNOSIS — F3289 Other specified depressive episodes: Secondary | ICD-10-CM

## 2022-05-29 DIAGNOSIS — E559 Vitamin D deficiency, unspecified: Secondary | ICD-10-CM

## 2022-05-29 DIAGNOSIS — Z7985 Long-term (current) use of injectable non-insulin antidiabetic drugs: Secondary | ICD-10-CM

## 2022-05-29 DIAGNOSIS — Z6827 Body mass index (BMI) 27.0-27.9, adult: Secondary | ICD-10-CM

## 2022-05-29 DIAGNOSIS — E669 Obesity, unspecified: Secondary | ICD-10-CM

## 2022-05-29 DIAGNOSIS — E1169 Type 2 diabetes mellitus with other specified complication: Secondary | ICD-10-CM

## 2022-05-29 MED ORDER — DULOXETINE HCL 60 MG PO CPEP: 60.0000 mg | ORAL_CAPSULE | Freq: Every day | ORAL | 0 refills | Status: AC

## 2022-05-29 MED ORDER — BUPROPION HCL ER (XL) 300 MG PO TB24: 300.0000 mg | ORAL_TABLET | Freq: Every day | ORAL | 0 refills | Status: DC

## 2022-05-29 MED ORDER — OZEMPIC (2 MG/DOSE) 8 MG/3ML ~~LOC~~ SOPN: 2.0000 mg | PEN_INJECTOR | SUBCUTANEOUS | 0 refills | Status: DC

## 2022-05-29 MED ORDER — VITAMIN D (ERGOCALCIFEROL) 1.25 MG (50000 UNIT) PO CAPS: 50000.0000 [IU] | ORAL_CAPSULE | ORAL | 0 refills | Status: AC

## 2022-06-06 ENCOUNTER — Encounter (INDEPENDENT_AMBULATORY_CARE_PROVIDER_SITE_OTHER): Payer: Self-pay | Admitting: Family Medicine

## 2022-06-06 DIAGNOSIS — E1169 Type 2 diabetes mellitus with other specified complication: Secondary | ICD-10-CM

## 2022-06-06 DIAGNOSIS — E785 Hyperlipidemia, unspecified: Secondary | ICD-10-CM

## 2022-06-06 MED ORDER — ROSUVASTATIN CALCIUM 20 MG PO TABS: 20.0000 mg | ORAL_TABLET | Freq: Every day | ORAL | 1 refills | Status: AC

## 2022-06-26 NOTE — Progress Notes (Signed)
TeleHealth Visit:  This visit was completed with telemedicine (audio/video) technology. [name] has verbally consented to this TeleHealth visit. The patient is located at home, the provider is located at home. The participants in this visit include the listed provider and patient. The visit was conducted today via MyChart video.  OBESITY [name] is here to discuss her progress with her obesity treatment plan along with follow-up of her obesity related diagnoses.    Today's visit was # 51  Starting date: 01/28/2018 Starting weight: 186 lbs Weight reported at last virtual office visit: 137 lbs on 08/29/22 Today's reported weight (06/27/22):  138 lbs Total weight loss: 49 lbs Weight change since last visit: +1 lbs   Labs at PCP on 11/28/21 A1c - 5.3 Vit D level - 38.9 Hgb - 12.5 AST - 63 ALT - 63 B12 - 437 LDL 56, HDL 49, Tg 65 TSH - 1.1  Nutrition Plan: practicing portion control and making smarter food choices, such as increasing vegetables and decreasing simple carbohydrates and Other 80 gms protein daily    Current exercise:  working 4-5 days per [employer] active job  Interim History:  Weight is stable. She is working 5 days/week at [employer]. She is happy with current weight although she would like to lose more.  She is getting 80 gms of protein most days.  She has boiled eggs, cheese, protein shake. She is getting in enough vegetables.  She sees a new PCP in August.  Assessment/Plan:  1. Type 2 Diabetes Mellitus with other specified complication, without long-term current use of insulin HgbA1c is at goal. Last A1c was 5.3 at PCP in Dec 2023. CBGs: Not checking Episodes of hypoglycemia: no Medication(s): Ozempic 2 mg SQ weekly  Lab Results  Component Value Date   HGBA1C 5.6 05/04/2021   HGBA1C 5.2 12/13/2020   HGBA1C 5.1 10/15/2019   Lab Results  Component Value Date   MICROALBUR <0.7 07/03/2017   LDLCALC 47 12/13/2020   CREATININE 0.80 05/04/2021    Lab Results  Component Value Date   GFR 64.07 07/03/2017   GFR 81.30 09/06/2016   GFR 78.98 03/14/2016    Plan: Continue Ozempic 2 mg SQ weekly   2. Vitamin D Deficiency Vitamin D is not at goal of 50.  Most recent vitamin D level was 38 in December 2023 at PCP.  She has been on twice weekly 50,000 IU vitamin D for a few years and this maintains her vitamin D around 40. She is on  prescription ergocalciferol 50,000 IU twice weekly. Lab Results  Component Value Date   VD25OH 40.6 05/04/2021   VD25OH 37.8 12/13/2020   VD25OH 43.5 10/15/2019    Plan: Continue  prescription ergocalciferol 50,000 IU twice weekly She is seeing her PCP in August.  She might have lab work done.  I asked her to see if they will check her vitamin D level.  3.Generalized Obesity: Current BMI 27  Briele is currently in the action stage of change. As such, her goal is to continue with weight loss efforts.  She has agreed to low carbohydrate plan with a goal of 80 g of protein per day.  Exercise goals:  as is  Behavioral modification strategies: planning for success.  Kayleeann has agreed to follow-up with our clinic in 4 weeks.  No orders of the defined types were placed in this encounter.   There are no discontinued medications.   No orders of the defined types were placed in this encounter.  Objective:   VITALS: Per patient if applicable, see vitals. GENERAL: Alert and in no acute distress. CARDIOPULMONARY: No increased WOB. Speaking in clear sentences.  PSYCH: Pleasant and cooperative. Speech normal rate and rhythm. Affect is appropriate. Insight and judgement are appropriate. Attention is focused, linear, and appropriate.  NEURO: Oriented as arrived to appointment on time with no prompting.   Attestation Statements:   Reviewed by clinician on day of visit: allergies, medications, problem list, medical history, surgical history, family history, social history, and previous encounter  notes.  Time spent on visit including the items listed below was 30 minutes.  -preparing to see the patient (e.g., review of tests, history, previous notes) -obtaining and/or reviewing separately obtained history -counseling and educating the patient/family/caregiver -documenting clinical information in the electronic or other health record -ordering medications, tests, or procedures -independently interpreting results and communicating results to the patient/ family/caregiver -referring and communicating with other health care professionals  -care coordination    This was prepared with the assistance of [employer].  Occasional wrong-word or sound-a-like substitutions may have occurred due to the inherent limitations of voice recognition software.

## 2022-06-27 ENCOUNTER — Encounter (INDEPENDENT_AMBULATORY_CARE_PROVIDER_SITE_OTHER): Payer: Self-pay | Admitting: Family Medicine

## 2022-06-27 ENCOUNTER — Telehealth (INDEPENDENT_AMBULATORY_CARE_PROVIDER_SITE_OTHER): Payer: Medicare Other | Admitting: Family Medicine

## 2022-06-27 VITALS — Ht 59.0 in | Wt 138.0 lb

## 2022-06-27 DIAGNOSIS — Z7985 Long-term (current) use of injectable non-insulin antidiabetic drugs: Secondary | ICD-10-CM

## 2022-06-27 DIAGNOSIS — E1169 Type 2 diabetes mellitus with other specified complication: Secondary | ICD-10-CM | POA: Diagnosis not present

## 2022-06-27 DIAGNOSIS — E669 Obesity, unspecified: Secondary | ICD-10-CM | POA: Diagnosis not present

## 2022-06-27 DIAGNOSIS — Z6827 Body mass index (BMI) 27.0-27.9, adult: Secondary | ICD-10-CM

## 2022-06-27 DIAGNOSIS — E559 Vitamin D deficiency, unspecified: Secondary | ICD-10-CM | POA: Diagnosis not present

## 2022-07-06 LAB — TSH: TSH: 1.83 (ref 0.41–5.90)

## 2022-07-06 LAB — CBC AND DIFFERENTIAL
HCT: 40 (ref 36–46)
Hemoglobin: 13.3 (ref 12.0–16.0)
Platelets: 151 10*3/uL (ref 150–400)
WBC: 5.3

## 2022-07-06 LAB — BASIC METABOLIC PANEL
BUN: 22 — AB (ref 4–21)
CO2: 27 — AB (ref 13–22)
Chloride: 104 (ref 99–108)
Creatinine: 0.8 (ref 0.5–1.1)
Potassium: 3.9 mEq/L (ref 3.5–5.1)
Sodium: 138 (ref 137–147)

## 2022-07-06 LAB — COMPREHENSIVE METABOLIC PANEL
Albumin: 4.1 (ref 3.5–5.0)
Calcium: 9.7 (ref 8.7–10.7)
Globulin: 2.3

## 2022-07-06 LAB — HEPATIC FUNCTION PANEL
ALT: 26 U/L (ref 7–35)
AST: 29 (ref 13–35)
Alkaline Phosphatase: 57 (ref 25–125)

## 2022-07-06 LAB — HEMOGLOBIN A1C
Hemoglobin A1C: 5.1
Hemoglobin A1C: 5.1

## 2022-07-06 LAB — CBC: RBC: 4.05 (ref 3.87–5.11)

## 2022-07-06 LAB — LIPID PANEL
Cholesterol: 111 (ref 0–200)
HDL: 49 (ref 35–70)
LDL Cholesterol: 48
Triglycerides: 71 (ref 40–160)

## 2022-07-06 LAB — VITAMIN D 25 HYDROXY (VIT D DEFICIENCY, FRACTURES): Vit D, 25-Hydroxy: 46.3

## 2022-07-09 ENCOUNTER — Encounter: Payer: Self-pay | Admitting: Family Medicine

## 2022-07-25 ENCOUNTER — Telehealth (INDEPENDENT_AMBULATORY_CARE_PROVIDER_SITE_OTHER): Payer: Medicare Other | Admitting: Family Medicine

## 2022-07-25 NOTE — Progress Notes (Signed)
TeleHealth Visit:  This visit was completed with telemedicine (audio/video) technology. [name] has verbally consented to this TeleHealth visit. The patient is located at home, the provider is located at home. The participants in this visit include the listed provider and patient. The visit was conducted today via MyChart video.  OBESITY [name] is here to discuss her progress with her obesity treatment plan along with follow-up of her obesity related diagnoses.    Today's visit was # 52  Starting date: 01/28/2018 Starting weight: 186 lbs Weight reported at last virtual office visit: 137 lbs on 06/27/22 Today's reported weight (07/30/22):  140 lbs Total weight loss: 46 lbs Weight change since last visit: +3 lbs  Nutrition Plan: low carbohydrate plan and Other 80 gms protein daily    Current exercise:  working 4-5 days per [employer] active job, walks 40 minutes 2-3 days per week.  Interim History:  [name] is maintaining her weight well between 135-140 lbs. Weight today was 140 pounds. Following portion control smart choices more than low-carb plan. She is still drinking about 1 Coke per day. She is working a lot of hours- worked 12 hours yesterday at [employer].  Generally works 40 hours/week. She focuses on protein first and does well with that. She is off on Fridays and does some meal prepping. She eats plenty of fruits and vegetables.  She is walking 40 minutes on her days off.   She had labs done on June 14 at her PCP: Gail Manges NP at [facility], [phone] Assessment/Plan:  1. Essential Tremor Reports tremor is barely noticeable. She tolerating propranolol 40 mg twice daily well. Denies signs and symptoms of hypotension.  At one point she was having hypotension with the propranolol but she reports this is not an issue. She is establishing care with a new PCP in August and is out of her propranolol.  Plan: Refill propranolol 40 mg twice daily.  2.  Type 2 Diabetes Mellitus with other specified complication, without long-term current use of insulin HgbA1c is at goal. Last A1c that I have access to was 5.3 on 11/28/2021.  Will obtain most recent A1c from PCP. CBGs: Not checking Episodes of hypoglycemia: no Medication(s): Ozempic 2 mg SQ weekly  Lab Results  Component Value Date   HGBA1C 5.1 07/06/2022   HGBA1C 5.6 05/04/2021   HGBA1C 5.2 12/13/2020   Lab Results  Component Value Date   MICROALBUR <0.7 07/03/2017   LDLCALC 47 12/13/2020   CREATININE 0.80 05/04/2021   Lab Results  Component Value Date   GFR 64.07 07/03/2017   GFR 81.30 09/06/2016   GFR 78.98 03/14/2016    Plan: Continue and refill Ozempic 2 mg SQ weekly  3. Other depression/emotional eating [name] has had issues with stress/emotional eating. Currently this is well controlled. Overall mood is stable.  Medication(s): Bupropion XL 300 mg daily Also on Cymbalta.  Plan: Continue and refill Bupropion XL 300 mg daily Continue Cymbalta per PCP.   Generalized Obesity: Current BMI 28   [name] is currently in the action stage of change. As such, her goal is to continue with weight loss efforts.  She has agreed to practicing portion control and making smarter food choices, such as increasing vegetables and decreasing simple carbohydrates.  Exercise goals: as is  Behavioral modification strategies: increasing lean protein intake and decrease liquid calories.  [name] has agreed to follow-up with our clinic in 4 weeks.  No orders of the defined types were placed in this encounter.  Medications Discontinued During This Encounter  Medication Reason   propranolol (INDERAL) 40 MG tablet Reorder   buPROPion (WELLBUTRIN XL) 300 MG 24 hr tablet Reorder   Semaglutide, 2 MG/DOSE, (OZEMPIC, 2 MG/DOSE,) 8 MG/3ML SOPN Reorder     Meds ordered this encounter  Medications   buPROPion (WELLBUTRIN XL) 300 MG 24 hr tablet    Sig: Take 1 tablet (300 mg total) by mouth  daily.    Dispense:  90 tablet    Refill:  0    Order Specific Question:   Supervising Provider    Answer:   Glennis Brink [[id number]]   Semaglutide, 2 MG/DOSE, (OZEMPIC, 2 MG/DOSE,) 8 MG/3ML SOPN    Sig: Inject 2 mg into the skin once a week.    Dispense:  3 mL    Refill:  0    Order Specific Question:   Supervising Provider    Answer:   Glennis Brink [[id number]]   propranolol (INDERAL) 40 MG tablet    Sig: Take 1 tablet (40 mg total) by mouth 2 (two) times daily.    Dispense:  180 tablet    Refill:  0    Order Specific Question:   Supervising Provider    Answer:   Glennis Brink [[id number]]      Objective:   VITALS: Per patient if applicable, see vitals. GENERAL: Alert and in no acute distress. CARDIOPULMONARY: No increased WOB. Speaking in clear sentences.  PSYCH: Pleasant and cooperative. Speech normal rate and rhythm. Affect is appropriate. Insight and judgement are appropriate. Attention is focused, linear, and appropriate.  NEURO: Oriented as arrived to appointment on time with no prompting.   Attestation Statements:   Reviewed by clinician on day of visit: allergies, medications, problem list, medical history, surgical history, family history, social history, and previous encounter notes.   This was prepared with the assistance of [employer].  Occasional wrong-word or sound-a-like substitutions may have occurred due to the inherent limitations of voice recognition software.

## 2022-07-30 ENCOUNTER — Encounter (INDEPENDENT_AMBULATORY_CARE_PROVIDER_SITE_OTHER): Payer: Self-pay | Admitting: Family Medicine

## 2022-07-30 ENCOUNTER — Telehealth: Payer: Self-pay

## 2022-07-30 ENCOUNTER — Telehealth (INDEPENDENT_AMBULATORY_CARE_PROVIDER_SITE_OTHER): Payer: Medicare Other | Admitting: Family Medicine

## 2022-07-30 VITALS — Ht 59.0 in | Wt 140.0 lb

## 2022-07-30 DIAGNOSIS — G250 Essential tremor: Secondary | ICD-10-CM

## 2022-07-30 DIAGNOSIS — E1169 Type 2 diabetes mellitus with other specified complication: Secondary | ICD-10-CM | POA: Diagnosis not present

## 2022-07-30 DIAGNOSIS — E669 Obesity, unspecified: Secondary | ICD-10-CM

## 2022-07-30 DIAGNOSIS — Z6828 Body mass index (BMI) 28.0-28.9, adult: Secondary | ICD-10-CM

## 2022-07-30 DIAGNOSIS — F3289 Other specified depressive episodes: Secondary | ICD-10-CM

## 2022-07-30 DIAGNOSIS — Z7985 Long-term (current) use of injectable non-insulin antidiabetic drugs: Secondary | ICD-10-CM

## 2022-07-30 MED ORDER — BUPROPION HCL ER (XL) 300 MG PO TB24: 300.0000 mg | ORAL_TABLET | Freq: Every day | ORAL | 0 refills | Status: DC

## 2022-07-30 MED ORDER — OZEMPIC (2 MG/DOSE) 8 MG/3ML ~~LOC~~ SOPN: 2.0000 mg | PEN_INJECTOR | SUBCUTANEOUS | 0 refills | Status: DC

## 2022-07-30 MED ORDER — PROPRANOLOL HCL 40 MG PO TABS: 40.0000 mg | ORAL_TABLET | Freq: Two times a day (BID) | ORAL | 0 refills | Status: AC

## 2022-07-30 NOTE — Telephone Encounter (Signed)
Fax sent to [facility] at [phone] for patients 07/06/22 lab work.

## 2022-08-01 ENCOUNTER — Encounter: Payer: Self-pay | Admitting: Family Medicine

## 2022-08-26 NOTE — Progress Notes (Unsigned)
TeleHealth Visit:  This visit was completed with telemedicine (audio/video) technology. Neliah has verbally consented to this TeleHealth visit. The patient is located at home, the provider is located at home. The participants in this visit include the listed provider and patient. The visit was conducted today via MyChart video.  OBESITY Ayaan is here to discuss her progress with her obesity treatment plan along with follow-up of her obesity related diagnoses.    Today's visit was # 53  Starting date: 01/28/2018 Starting weight: 186 lbs Weight reported at last virtual office visit: 140 lbs on 07/30/22 Today's reported weight (08/27/22):  140 lbs Total weight loss: 46 lbs Weight change since last visit: 0 lbs  Nutrition Plan: practicing portion control and making smarter food choices, such as increasing vegetables and decreasing simple carbohydrates   Current exercise: very active at work- lots of walking.   Interim History:  Still working 40 hours per week and she is exhausted. Generally likes to work 30 hours per week. She has maintained her weight well at 140 lbs. Weight goal 135-140 lbs. She prefers to stay around 130 lbs. 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  Work hours will be reduced in a few weeks to 30 hours  Having protein drink every am plus banana. Has one meal with Protein at least once daily.  Takes leftovers to work or has Engineer, water.   Assessment/Plan:  1. Type 2 Diabetes Mellitus with other specified complication, without long-term current use of insulin HgbA1c is at goal. Last A1c was 5.1 at PCP in June. CBGs: Not checking Episodes of hypoglycemia: no Medication(s): Ozempic 2 mg SQ weekly Notices no appetite suppression with Ozempic.  Lab Results  Component Value Date   HGBA1C 5.1 07/06/2022   HGBA1C 5.1 07/06/2022   HGBA1C 5.6 05/04/2021   Lab Results  Component Value Date   MICROALBUR <0.7 07/03/2017    LDLCALC 48 07/06/2022   CREATININE 0.8 07/06/2022   Lab Results  Component Value Date   GFR 64.07 07/03/2017   GFR 81.30 09/06/2016   GFR 78.98 03/14/2016    Plan: D/C Ozempic. Start Mounjaro 10 mg SQ weekly   2. Other depression/emotional eating Bea has had issues with stress/emotional eating. Currently this is moderately controlled. Overall mood is stable. Medication(s): Bupropion XL 300 mg daily  Plan: Continue and refill Bupropion XL 300 mg daily   3. Morbid Obesity: Current BMI 28  Wynne is currently in the action stage of change. As such, her goal is to continue with weight loss efforts.  She has agreed to practicing portion control and making smarter food choices, such as increasing vegetables and decreasing simple carbohydrates.  Exercise goals: increase walking oyutside of work.  Behavioral modification strategies: increasing lean protein intake, decreasing simple carbohydrates , no meal skipping, decrease eating out, meal planning , and planning for success.  Faustine has agreed to follow-up with our clinic in 4 weeks.  No orders of the defined types were placed in this encounter.   Medications Discontinued During This Encounter  Medication Reason   buPROPion (WELLBUTRIN XL) 300 MG 24 hr tablet Reorder     Meds ordered this encounter  Medications   buPROPion (WELLBUTRIN XL) 300 MG 24 hr tablet    Sig: Take 1 tablet (300 mg total) by mouth daily.    Dispense:  90 tablet    Refill:  0    Order Specific Question:   Supervising  Provider    Answer:   Glennis Brink [[id number]]   tirzepatide [facility]) 10 MG/0.5ML Pen    Sig: Inject 10 mg into the skin once a week.    Dispense:  2 mL    Refill:  0    Order Specific Question:   Supervising Provider    Answer:   Glennis Brink [[id number]]      Objective:   VITALS: Per patient if applicable, see vitals. GENERAL: Alert and in no acute distress. CARDIOPULMONARY: No increased WOB. Speaking in clear sentences.   PSYCH: Pleasant and cooperative. Speech normal rate and rhythm. Affect is appropriate. Insight and judgement are appropriate. Attention is focused, linear, and appropriate.  NEURO: Oriented as arrived to appointment on time with no prompting.   Attestation Statements:   Reviewed by clinician on day of visit: allergies, medications, problem list, medical history, surgical history, family history, social history, and previous encounter notes.  This was prepared with the assistance of [employer].  Occasional wrong-word or sound-a-like substitutions may have occurred due to the inherent limitations of voice recognition software.

## 2022-08-27 ENCOUNTER — Telehealth (INDEPENDENT_AMBULATORY_CARE_PROVIDER_SITE_OTHER): Payer: Medicare Other | Admitting: Family Medicine

## 2022-08-27 ENCOUNTER — Encounter (INDEPENDENT_AMBULATORY_CARE_PROVIDER_SITE_OTHER): Payer: Self-pay | Admitting: Family Medicine

## 2022-08-27 ENCOUNTER — Telehealth (INDEPENDENT_AMBULATORY_CARE_PROVIDER_SITE_OTHER): Payer: Self-pay

## 2022-08-27 VITALS — Ht 59.0 in | Wt 140.0 lb

## 2022-08-27 DIAGNOSIS — E1169 Type 2 diabetes mellitus with other specified complication: Secondary | ICD-10-CM | POA: Diagnosis not present

## 2022-08-27 DIAGNOSIS — F3289 Other specified depressive episodes: Secondary | ICD-10-CM

## 2022-08-27 DIAGNOSIS — E669 Obesity, unspecified: Secondary | ICD-10-CM

## 2022-08-27 DIAGNOSIS — Z7985 Long-term (current) use of injectable non-insulin antidiabetic drugs: Secondary | ICD-10-CM

## 2022-08-27 DIAGNOSIS — Z6828 Body mass index (BMI) 28.0-28.9, adult: Secondary | ICD-10-CM | POA: Diagnosis not present

## 2022-08-27 MED ORDER — TIRZEPATIDE 10 MG/0.5ML ~~LOC~~ SOAJ: 10.0000 mg | SUBCUTANEOUS | 0 refills | Status: DC

## 2022-08-27 MED ORDER — BUPROPION HCL ER (XL) 300 MG PO TB24: 300.0000 mg | ORAL_TABLET | Freq: Every day | ORAL | 0 refills | Status: AC

## 2022-08-27 NOTE — Telephone Encounter (Signed)
Hi Dawn! Yes I did and I hope you did as well!   There was not a PA in CoverMy Meds so I called the pharmacy [phone]) and he said it went through her insurance (ins paid 777.00/her cost is 256.71 due to [place]).  Please advise if needing anything else.    I wish you well and you will be missed!!!!

## 2022-09-20 ENCOUNTER — Other Ambulatory Visit (INDEPENDENT_AMBULATORY_CARE_PROVIDER_SITE_OTHER): Payer: Self-pay | Admitting: Family Medicine

## 2022-09-20 DIAGNOSIS — E1169 Type 2 diabetes mellitus with other specified complication: Secondary | ICD-10-CM

## 2022-09-20 NOTE — Progress Notes (Signed)
TeleHealth Visit:  This visit was completed with telemedicine (audio/video) technology. [name] has verbally consented to this TeleHealth visit. The patient is located at home, the provider is located at home. The participants in this visit include the listed provider and patient. The visit was conducted today via MyChart video.  OBESITY [name] is here to discuss her progress with her obesity treatment plan along with follow-up of her obesity related diagnoses.    Today's visit was # 54  Starting date: 01/28/2018 Starting weight: 186 lbs Weight reported at last virtual office visit: 140 lbs on 08/27/22 Today's reported weight (09/25/22):  136 lbs Total weight loss: 40 lbs Weight change since last visit: -4 lbs   Nutrition Plan: practicing portion control and making smarter food choices, such as increasing vegetables and decreasing simple carbohydrates    Current exercise: very active at work- lots of walking.   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 Hours will be less though due to fall/winter hours. She started the [facility] and has lost 4 lbs. Still has sugar cravings. Drinks Coke at work. She focuses on getting in protein.  She is not cooking much at home because her husband does not eat which she cooks.  She often buys a salad at [employer] and goes light on the dressing. Goal weight is 130-140.   Assessment/Plan:  1. Type 2 Diabetes Mellitus with other specified complication, without long-term current use of insulin HgbA1c is at goal. Last A1c was 5.1. CBGs: Not checking Episodes of hypoglycemia: no Medication(s): Mounjaro 10 mg SQ weekly We recently switched her to [facility].  It is costing her $300 a month because she is in the donut hole. She notices decreased appetite and cravings since starting the [facility].  Lab Results  Component Value Date   HGBA1C 5.1 07/06/2022   HGBA1C 5.1 07/06/2022   HGBA1C 5.6 05/04/2021   Lab  Results  Component Value Date   MICROALBUR <0.7 07/03/2017   LDLCALC 48 07/06/2022   CREATININE 0.8 07/06/2022   Lab Results  Component Value Date   GFR 64.07 07/03/2017   GFR 81.30 09/06/2016   GFR 78.98 03/14/2016    Plan: Continue and increase dose Mounjaro 12.5 mg SQ weekly   2. Other depression/emotional eating [name] has had issues with stress/emotional eating. Currently this is moderately controlled. Overall mood is stable. Medication(s): Bupropion XL 300 mg daily  Plan: Continue and refill Bupropion XL 300 mg daily Increase dose of Mounjaro to 12.5 mg weekly.  3. Generalized Obesity: Current BMI 27  [name] is currently in the action stage of change. As such, her goal is to continue with weight loss efforts.  She has agreed to practicing portion control and making smarter food choices, such as increasing vegetables and decreasing simple carbohydrates.  1.  Avoid drinking any soda at home.  Reduce soda at work. 2.  Increase water intake. 3.  Continue good protein intake.  Exercise goals:  as is  Behavioral modification strategies: increasing lean protein intake, decreasing simple carbohydrates , and meal planning .  [name] has agreed to follow-up with our clinic in 4 weeks.  No orders of the defined types were placed in this encounter.   Medications Discontinued During This Encounter  Medication Reason   Semaglutide, 2 MG/DOSE, (OZEMPIC, 2 MG/DOSE,) 8 MG/3ML SOPN Change in therapy   tirzepatide (MOUNJARO) 10 MG/0.5ML Pen Dose change     Meds ordered this encounter  Medications   tirzepatide (  MOUNJARO) 12.5 MG/0.5ML Pen    Sig: Inject 12.5 mg into the skin once a week.    Dispense:  2 mL    Refill:  0    Order Specific Question:   Supervising Provider    Answer:   Glennis Brink [[id number]]      Objective:   VITALS: Per patient if applicable, see vitals. GENERAL: Alert and in no acute distress. CARDIOPULMONARY: No increased WOB. Speaking in clear sentences.   PSYCH: Pleasant and cooperative. Speech normal rate and rhythm. Affect is appropriate. Insight and judgement are appropriate. Attention is focused, linear, and appropriate.  NEURO: Oriented as arrived to appointment on time with no prompting.   Attestation Statements:   Reviewed by clinician on day of visit: allergies, medications, problem list, medical history, surgical history, family history, social history, and previous encounter notes.   This was prepared with the assistance of [employer].  Occasional wrong-word or sound-a-like substitutions may have occurred due to the inherent limitations of voice recognition software.

## 2022-09-25 ENCOUNTER — Telehealth (INDEPENDENT_AMBULATORY_CARE_PROVIDER_SITE_OTHER): Payer: Medicare Other | Admitting: Family Medicine

## 2022-09-25 ENCOUNTER — Encounter (INDEPENDENT_AMBULATORY_CARE_PROVIDER_SITE_OTHER): Payer: Self-pay | Admitting: Family Medicine

## 2022-09-25 VITALS — Ht 59.0 in | Wt 136.0 lb

## 2022-09-25 DIAGNOSIS — E669 Obesity, unspecified: Secondary | ICD-10-CM

## 2022-09-25 DIAGNOSIS — F3289 Other specified depressive episodes: Secondary | ICD-10-CM | POA: Diagnosis not present

## 2022-09-25 DIAGNOSIS — Z7985 Long-term (current) use of injectable non-insulin antidiabetic drugs: Secondary | ICD-10-CM

## 2022-09-25 DIAGNOSIS — E1169 Type 2 diabetes mellitus with other specified complication: Secondary | ICD-10-CM

## 2022-09-25 DIAGNOSIS — Z6827 Body mass index (BMI) 27.0-27.9, adult: Secondary | ICD-10-CM

## 2022-09-25 MED ORDER — TIRZEPATIDE 12.5 MG/0.5ML ~~LOC~~ SOAJ: 12.5000 mg | SUBCUTANEOUS | 0 refills | Status: AC

## 2022-09-29 ENCOUNTER — Other Ambulatory Visit (INDEPENDENT_AMBULATORY_CARE_PROVIDER_SITE_OTHER): Payer: Self-pay | Admitting: Family Medicine

## 2022-09-29 DIAGNOSIS — F3289 Other specified depressive episodes: Secondary | ICD-10-CM

## 2022-12-16 NOTE — Progress Notes (Unsigned)
Cardiology Office Note:  .   Date:  12/17/2022  ID:  [name], DOB [birth year], MRN [id number] PCP: [facility] Providers Cardiologist:  Yates Decamp, MD   History of Present Illness: .   [name] is a 77 y.o.  with history of obstructive sleep apnea on CPAP, prior morbid obesity and has maintained weight loss, prior history of gastric bypass in 2003, GERD, hyperlipidemia, chronic back pain, mild asymptomatic aortic stenosis and asymptomatic bilateral carotid stenosis.   Discussed the use of AI scribe software for clinical note transcription with the patient, who gave verbal consent to proceed.  History of Present Illness   The patient, with a history of diabetes and hypothyroidism, presents for a routine follow-up. She reports no new health concerns and is currently working full-time at a [employer] account [employer], which she enjoys as it keeps her active and engaged. She has maintained her weight loss and is tolerating her current medications without any reported complications. Her most recent A1c was 6.6, a decrease from the previous value of 6.9. She also reports that her family situation is stable, living with her son's family in an in-law apartment. Her daughter-in-law has been having medical issues, which has occasionally resulted in the patient and her spouse taking care of their grandchildren for extended periods.      Review of Systems  Cardiovascular:  Negative for chest pain, dyspnea on exertion and leg swelling.    Labs   Lab Results  Component Value Date   CHOL 111 07/06/2022   HDL 49 07/06/2022   LDLCALC 48 07/06/2022   TRIG 71 07/06/2022   CHOLHDL 2 07/03/2017   Lab Results  Component Value Date   NA 138 07/06/2022   K 3.9 07/06/2022   CO2 27 (A) 07/06/2022   GLUCOSE 78 05/04/2021   BUN 22 (A) 07/06/2022   CREATININE 0.8 07/06/2022   CALCIUM 9.7 07/06/2022   GFR 64.07 07/03/2017   EGFR 77 05/04/2021   GFRNONAA 84 10/15/2019       Latest Ref Rng & Units 07/06/2022   12:00 AM 05/04/2021   11:25 AM 10/15/2019   11:04 AM  BMP  Glucose 70 - 99 mg/dL  78  76   BUN 4 - 21 22  20  14    Creatinine 0.5 - 1.1 0.8  0.80  0.72   BUN/Creat Ratio 12 - 28  25  19    Sodium 137 - 147 138  143  140   Potassium 3.5 - 5.1 mEq/L 3.9  4.6  4.7   Chloride 99 - 108 104  106  100   CO2 13 - 22 27  22  25    Calcium 8.7 - 10.7 9.7  8.0  8.7        Latest Ref Rng & Units 07/06/2022   12:00 AM 05/04/2021   11:25 AM 12/13/2020   11:00 AM  CBC  WBC  5.3  4.5  4.8   Hemoglobin 12.0 - 16.0 13.3  11.1  12.3   Hematocrit 36 - 46 40  36.5  39.1   Platelets 150 - 400 K/uL 151  158  232    Physical Exam:   VS:  BP 122/70 (BP Location: Left Arm, Patient Position: Sitting, Cuff Size: Normal)   Pulse (!) 59   Resp 16   Ht 4\' 11"  (1.499 m)   Wt 143 lb 9.6 oz (65.1 kg)   SpO2 97%   BMI 29.00 kg/m  Wt Readings from Last 3 Encounters:  12/17/22 143 lb 9.6 oz (65.1 kg)  09/25/22 136 lb (61.7 kg)  08/27/22 140 lb (63.5 kg)     Physical Exam Neck:     Vascular: Carotid bruit (soft) present. No JVD.  Cardiovascular:     Rate and Rhythm: Normal rate and regular rhythm.     Pulses: Intact distal pulses.     Heart sounds: Murmur heard.     Harsh early systolic murmur is present with a grade of 2/6 at the upper right sternal border.     No gallop.  Pulmonary:     Effort: Pulmonary effort is normal.     Breath sounds: Normal breath sounds.  Abdominal:     General: Bowel sounds are normal.     Palpations: Abdomen is soft.  Musculoskeletal:     Right lower leg: No edema.     Left lower leg: No edema.     Studies Reviewed: .    Carotid artery duplex 12/19/2021: Right Carotid: Velocities in the right ICA are consistent with a 1-39% stenosis. Left Carotid: Velocities in the left ICA are consistent with a 1-39% stenosis. Vertebrals: Bilateral vertebral arteries demonstrate antegrade flow.  Compared to 12/17/2018, bilateral 15 to  49% stenosis is now very minimal, overall stable carotid artery duplex.  ECHOCARDIOGRAM COMPLETE 12/19/2021  1. Left ventricular ejection fraction, by estimation, is 55 to 60%. Left ventricular ejection fraction by 2D MOD biplane is 62.9 %. The left ventricle has normal function. The left ventricle has no regional wall motion abnormalities. Left ventricular diastolic parameters were normal. 2. Right ventricular systolic function is normal. The right ventricular size is normal. There is normal pulmonary artery systolic pressure. The estimated right ventricular systolic pressure is 30.2 mmHg. 3. Left atrial size was moderately dilated. 4. The mitral valve is normal in structure. Trivial mitral valve regurgitation. 5. Tricuspid valve regurgitation is moderate. 6. The aortic valve is tricuspid. There is mild calcification of the aortic valve. Aortic valve regurgitation is mild. Aortic valve sclerosis/calcification is present, without any evidence of aortic stenosis. Aortic valve area, by VTI measures 1.67 cm. Aortic valve mean gradient measures 8.7 mmHg. 7. The inferior vena cava is normal in size with greater than 50% respiratory variability, suggesting right atrial pressure of 3 mmHg.   EKG:    EKG Interpretation Date/Time:  Monday December 17 2022 13:39:52 EST Ventricular Rate:  57 PR Interval:  152 QRS Duration:  126 QT Interval:  468 QTC Calculation: 455 R Axis:   -45  Text Interpretation: EKG 12/17/2022:Normal sinus rhythm at rate of 57 bpm, left anterior fascicular block, incomplete right bundle branch block.  Confirmed by Delrae Rend [phone]) on 12/17/2022 1:54:14 PM   No significant change from 12/12/2021.   Medications and allergies    Allergies  Allergen Reactions   Hydrocodone Nausea And Vomiting     nausea and vomiting and headaches   Amoxicillin Hives   Cephalexin      Neuropathy   Codeine     Crazy in the head, bp drops   Levofloxacin Itching   Lyrica  [Pregabalin] Other (See Comments)    "Makes me like a zombie. I'm awake but I'm in slow motion."   Meperidine Hcl     B/P drops   Morphine Other (See Comments)   Morphine And Codeine      States" extreme migraines"   Oxycodone-Acetaminophen Other (See Comments)    Crazy in head, drop in bp   Oxycodone-Acetaminophen  Other (See Comments)   Sulfonamide Derivatives Nausea And Vomiting    Stomach upset   Valsartan-Hydrochlorothiazide Other (See Comments)    Headaches   Adhesive [Tape] Rash    Latex in adhesive tape. Please use paper tape   Erythromycin Diarrhea and Rash    Other reaction(s): GI Intolerance   Penicillins Nausea And Vomiting and Rash    Current Outpatient Medications:    ALPRAZolam (XANAX) 0.25 MG tablet, Take 1 tablet (0.25 mg total) by mouth daily as needed. For anxiety, Disp: 30 tablet, Rfl: 3   aspirin 81 MG tablet, Take 81 mg by mouth daily., Disp: , Rfl:    Biotin 10 MG CAPS, biotin 10,000 mcg capsule  Take 1 capsule every day by oral route., Disp: , Rfl:    buPROPion (WELLBUTRIN XL) 300 MG 24 hr tablet, Take 1 tablet (300 mg total) by mouth daily., Disp: 90 tablet, Rfl: 0   denosumab (PROLIA) 60 MG/ML SOSY injection, Inject 60 mg into the skin every 6 (six) months., Disp: , Rfl:    DULoxetine (CYMBALTA) 60 MG capsule, Take 1 capsule (60 mg total) by mouth daily., Disp: 90 capsule, Rfl: 0   fluticasone (FLONASE) 50 MCG/ACT nasal spray, Use 2 spray(s) in each nostril once daily, Disp: 16 g, Rfl: 3   hydrOXYzine (ATARAX/VISTARIL) 10 MG tablet, Take 1-2 tablets (10-20 mg total) by mouth 3 (three) times daily as needed for itching., Disp: 30 tablet, Rfl: 1   levocetirizine (XYZAL) 5 MG tablet, Take 5 mg by mouth daily. , Disp: , Rfl:    methylphenidate (RITALIN) 10 MG tablet, Take 1 tablet (10 mg total) by mouth as needed., Disp: 60 tablet, Rfl: 0   MYRBETRIQ 50 MG TB24 tablet, daily., Disp: , Rfl:    omeprazole (PRILOSEC) 20 MG capsule, Take 20 mg by mouth daily., Disp:  , Rfl:    propranolol (INDERAL) 40 MG tablet, Take 1 tablet (40 mg total) by mouth 2 (two) times daily., Disp: 180 tablet, Rfl: 0   rosuvastatin (CRESTOR) 20 MG tablet, Take 1 tablet (20 mg total) by mouth daily., Disp: 90 tablet, Rfl: 1   tirzepatide (MOUNJARO) 12.5 MG/0.5ML Pen, Inject 12.5 mg into the skin once a week., Disp: 2 mL, Rfl: 0   traZODone (DESYREL) 100 MG tablet, TAKE 1/2 TO 1 (ONE-HALF TO ONE) TABLET BY MOUTH ONCE DAILY AS NEEDED FOR SLEEP, Disp: 90 tablet, Rfl: 0   Vitamin D, Ergocalciferol, (DRISDOL) 1.25 MG (50000 UNIT) CAPS capsule, Take 1 capsule (50,000 Units total) by mouth 2 (two) times a week., Disp: 30 capsule, Rfl: 0     ASSESSMENT AND PLAN: .      ICD-10-CM   1. Mild aortic stenosis  I35.0     2. Bilateral carotid artery stenosis  I65.23 EKG 12-Lead    3. Pure hypercholesterolemia  E78.00       1. Mild aortic stenosis Patient presents for 8-month office visit, she has remained stable and asymptomatic.  Very mild aortic stenosis both the physical examination and echocardiogram done in November 2023.  I do not think she needs repeat echocardiogram.  2. Bilateral carotid artery stenosis She has been very soft bilateral carotid bruit, related to conducted sounds from the aortic stenosis.  Her recent carotid artery duplex done again a year ago revealed minimal disease with very minimal plaque.  3. Pure hypercholesterolemia Presently on a rosuvastatin 20 mg daily, reviewed her labs, lipids under excellent control.  Normal renal function, normal CBC.  Continue present management,  no changes were done with regard to medication management, I encouraged her to continue to remain active.  I will see her back in a year or sooner if problems.  Her husband is present and all questions answered.  Signed,  Yates Decamp, MD, [facility] 12/17/2022, 3:39 PM [facility] [address] [place], [place] [address] Phone: [phone]. Fax:  [phone]

## 2022-12-17 ENCOUNTER — Ambulatory Visit: Payer: Medicare Other | Attending: Cardiology | Admitting: Cardiology

## 2022-12-17 ENCOUNTER — Encounter: Payer: Self-pay | Admitting: Cardiology

## 2022-12-17 VITALS — BP 122/70 | HR 59 | Resp 16 | Ht 59.0 in | Wt 143.6 lb

## 2022-12-17 DIAGNOSIS — E7800 Pure hypercholesterolemia, unspecified: Secondary | ICD-10-CM

## 2022-12-17 DIAGNOSIS — I6523 Occlusion and stenosis of bilateral carotid arteries: Secondary | ICD-10-CM | POA: Diagnosis not present

## 2022-12-17 DIAGNOSIS — I350 Nonrheumatic aortic (valve) stenosis: Secondary | ICD-10-CM

## 2022-12-17 NOTE — Patient Instructions (Signed)

## 2022-12-18 ENCOUNTER — Ambulatory Visit: Payer: Self-pay | Admitting: Cardiology
